# Patient Record
Sex: Female | Born: 1970 | Race: White | Hispanic: No | Marital: Single | Smoking: Never smoker
Health system: Southern US, Community
[De-identification: ages and names within clinical notes are randomized; demographics above are authoritative.]

## PROBLEM LIST (undated history)

## (undated) DIAGNOSIS — R9389 Abnormal findings on diagnostic imaging of other specified body structures: Secondary | ICD-10-CM

## (undated) DIAGNOSIS — M1990 Unspecified osteoarthritis, unspecified site: Secondary | ICD-10-CM

## (undated) DIAGNOSIS — E119 Type 2 diabetes mellitus without complications: Secondary | ICD-10-CM

## (undated) DIAGNOSIS — G809 Cerebral palsy, unspecified: Secondary | ICD-10-CM

## (undated) DIAGNOSIS — J449 Chronic obstructive pulmonary disease, unspecified: Secondary | ICD-10-CM

## (undated) DIAGNOSIS — F32A Depression, unspecified: Secondary | ICD-10-CM

## (undated) DIAGNOSIS — K219 Gastro-esophageal reflux disease without esophagitis: Secondary | ICD-10-CM

## (undated) DIAGNOSIS — F329 Major depressive disorder, single episode, unspecified: Secondary | ICD-10-CM

## (undated) DIAGNOSIS — T8859XA Other complications of anesthesia, initial encounter: Secondary | ICD-10-CM

## (undated) DIAGNOSIS — J45909 Unspecified asthma, uncomplicated: Secondary | ICD-10-CM

## (undated) DIAGNOSIS — F419 Anxiety disorder, unspecified: Secondary | ICD-10-CM

## (undated) DIAGNOSIS — N950 Postmenopausal bleeding: Secondary | ICD-10-CM

## (undated) DIAGNOSIS — I10 Essential (primary) hypertension: Secondary | ICD-10-CM

## (undated) DIAGNOSIS — T4145XA Adverse effect of unspecified anesthetic, initial encounter: Secondary | ICD-10-CM

## (undated) HISTORY — DX: Gastro-esophageal reflux disease without esophagitis: K21.9

## (undated) HISTORY — DX: Unspecified osteoarthritis, unspecified site: M19.90

## (undated) HISTORY — DX: Depression, unspecified: F32.A

## (undated) HISTORY — DX: Essential (primary) hypertension: I10

## (undated) HISTORY — PX: CHOLECYSTECTOMY: SHX55

## (undated) HISTORY — DX: Cerebral palsy, unspecified: G80.9

## (undated) HISTORY — DX: Chronic obstructive pulmonary disease, unspecified: J44.9

## (undated) HISTORY — DX: Major depressive disorder, single episode, unspecified: F32.9

## (undated) HISTORY — PX: OTHER SURGICAL HISTORY: SHX169

## (undated) HISTORY — DX: Unspecified asthma, uncomplicated: J45.909

## (undated) HISTORY — DX: Type 2 diabetes mellitus without complications: E11.9

## (undated) HISTORY — DX: Anxiety disorder, unspecified: F41.9

## (undated) HISTORY — PX: JOINT REPLACEMENT: SHX530

## (undated) HISTORY — PX: FOOT CAPSULE RELEASE W/ PERCUTANEOUS HEEL CORD LENGTHENING, TIBIAL TENDON TRANSFER: SHX1658

---

## 2004-05-05 ENCOUNTER — Ambulatory Visit: Payer: Self-pay | Admitting: Family Medicine

## 2004-07-11 ENCOUNTER — Emergency Department: Payer: Self-pay | Admitting: Emergency Medicine

## 2004-07-12 ENCOUNTER — Ambulatory Visit: Payer: Self-pay | Admitting: Emergency Medicine

## 2004-11-09 ENCOUNTER — Ambulatory Visit: Payer: Self-pay | Admitting: Family Medicine

## 2004-12-18 ENCOUNTER — Emergency Department: Payer: Self-pay | Admitting: Emergency Medicine

## 2005-01-03 ENCOUNTER — Ambulatory Visit: Payer: Self-pay | Admitting: Unknown Physician Specialty

## 2005-02-15 ENCOUNTER — Emergency Department: Payer: Self-pay | Admitting: Unknown Physician Specialty

## 2006-01-21 ENCOUNTER — Ambulatory Visit: Payer: Self-pay

## 2006-02-05 ENCOUNTER — Ambulatory Visit: Payer: Self-pay | Admitting: General Surgery

## 2006-02-08 ENCOUNTER — Ambulatory Visit: Payer: Self-pay | Admitting: General Surgery

## 2006-03-04 ENCOUNTER — Encounter: Payer: Self-pay | Admitting: Physical Medicine and Rehabilitation

## 2006-03-30 ENCOUNTER — Encounter: Payer: Self-pay | Admitting: Physical Medicine and Rehabilitation

## 2006-06-03 ENCOUNTER — Emergency Department: Payer: Self-pay

## 2006-11-11 ENCOUNTER — Ambulatory Visit: Payer: Self-pay | Admitting: Pain Medicine

## 2006-12-09 ENCOUNTER — Ambulatory Visit: Payer: Self-pay | Admitting: Pain Medicine

## 2006-12-09 ENCOUNTER — Emergency Department: Payer: Self-pay | Admitting: Emergency Medicine

## 2006-12-12 ENCOUNTER — Other Ambulatory Visit: Payer: Self-pay

## 2006-12-12 ENCOUNTER — Emergency Department: Payer: Self-pay | Admitting: Emergency Medicine

## 2006-12-31 ENCOUNTER — Ambulatory Visit: Payer: Self-pay | Admitting: Gastroenterology

## 2007-02-19 ENCOUNTER — Ambulatory Visit: Payer: Self-pay | Admitting: Gastroenterology

## 2007-04-04 ENCOUNTER — Encounter: Payer: Self-pay | Admitting: Physical Medicine and Rehabilitation

## 2007-12-15 ENCOUNTER — Encounter: Payer: Self-pay | Admitting: Orthopedic Surgery

## 2007-12-30 ENCOUNTER — Encounter: Payer: Self-pay | Admitting: Orthopedic Surgery

## 2008-01-29 ENCOUNTER — Encounter: Payer: Self-pay | Admitting: Orthopedic Surgery

## 2008-02-29 ENCOUNTER — Encounter: Payer: Self-pay | Admitting: Orthopedic Surgery

## 2008-03-30 ENCOUNTER — Encounter: Payer: Self-pay | Admitting: Orthopedic Surgery

## 2008-04-30 ENCOUNTER — Encounter: Payer: Self-pay | Admitting: Orthopedic Surgery

## 2008-05-31 ENCOUNTER — Encounter: Payer: Self-pay | Admitting: Orthopedic Surgery

## 2008-09-08 ENCOUNTER — Encounter: Payer: Self-pay | Admitting: Family Medicine

## 2008-09-28 ENCOUNTER — Encounter: Payer: Self-pay | Admitting: Family Medicine

## 2008-10-04 ENCOUNTER — Ambulatory Visit: Payer: Self-pay | Admitting: Family Medicine

## 2008-10-28 ENCOUNTER — Encounter: Payer: Self-pay | Admitting: Family Medicine

## 2008-11-28 ENCOUNTER — Encounter: Payer: Self-pay | Admitting: Family Medicine

## 2008-12-29 ENCOUNTER — Encounter: Payer: Self-pay | Admitting: Family Medicine

## 2009-01-28 ENCOUNTER — Encounter: Payer: Self-pay | Admitting: Family Medicine

## 2009-03-16 ENCOUNTER — Encounter: Payer: Self-pay | Admitting: Orthopedic Surgery

## 2009-03-30 ENCOUNTER — Encounter: Payer: Self-pay | Admitting: Orthopedic Surgery

## 2009-04-30 ENCOUNTER — Encounter: Payer: Self-pay | Admitting: Orthopedic Surgery

## 2009-05-31 ENCOUNTER — Encounter: Payer: Self-pay | Admitting: Orthopedic Surgery

## 2009-06-28 ENCOUNTER — Encounter: Payer: Self-pay | Admitting: Orthopedic Surgery

## 2009-07-29 ENCOUNTER — Encounter: Payer: Self-pay | Admitting: Orthopedic Surgery

## 2009-08-28 ENCOUNTER — Encounter: Payer: Self-pay | Admitting: Orthopedic Surgery

## 2009-09-05 ENCOUNTER — Encounter: Payer: Self-pay | Admitting: Family Medicine

## 2009-09-28 ENCOUNTER — Encounter: Payer: Self-pay | Admitting: Family Medicine

## 2010-02-15 ENCOUNTER — Ambulatory Visit: Payer: Self-pay | Admitting: Pain Medicine

## 2010-03-07 ENCOUNTER — Ambulatory Visit: Payer: Self-pay | Admitting: Pain Medicine

## 2010-06-14 ENCOUNTER — Encounter: Payer: Self-pay | Admitting: Orthopedic Surgery

## 2010-06-29 ENCOUNTER — Encounter: Payer: Self-pay | Admitting: Orthopedic Surgery

## 2010-07-30 ENCOUNTER — Encounter: Payer: Self-pay | Admitting: Orthopedic Surgery

## 2010-08-10 ENCOUNTER — Ambulatory Visit: Payer: Self-pay | Admitting: Family Medicine

## 2010-08-29 ENCOUNTER — Encounter: Payer: Self-pay | Admitting: Orthopedic Surgery

## 2010-09-29 ENCOUNTER — Encounter: Payer: Self-pay | Admitting: Orthopedic Surgery

## 2010-10-29 ENCOUNTER — Encounter: Payer: Self-pay | Admitting: Orthopedic Surgery

## 2011-02-02 ENCOUNTER — Encounter: Payer: Self-pay | Admitting: Family Medicine

## 2011-03-01 ENCOUNTER — Encounter: Payer: Self-pay | Admitting: Family Medicine

## 2011-03-31 ENCOUNTER — Encounter: Payer: Self-pay | Admitting: Family Medicine

## 2011-10-17 ENCOUNTER — Encounter: Payer: Self-pay | Admitting: Orthopedic Surgery

## 2011-10-29 ENCOUNTER — Encounter: Payer: Self-pay | Admitting: Orthopedic Surgery

## 2012-02-06 DIAGNOSIS — R262 Difficulty in walking, not elsewhere classified: Secondary | ICD-10-CM | POA: Insufficient documentation

## 2012-05-07 DIAGNOSIS — M1710 Unilateral primary osteoarthritis, unspecified knee: Secondary | ICD-10-CM | POA: Insufficient documentation

## 2012-05-26 ENCOUNTER — Encounter: Payer: Self-pay | Admitting: Physical Medicine and Rehabilitation

## 2012-05-31 ENCOUNTER — Encounter: Payer: Self-pay | Admitting: Physical Medicine and Rehabilitation

## 2012-06-28 ENCOUNTER — Encounter: Payer: Self-pay | Admitting: Physical Medicine and Rehabilitation

## 2012-07-29 ENCOUNTER — Encounter: Payer: Self-pay | Admitting: Physical Medicine and Rehabilitation

## 2012-08-12 DIAGNOSIS — R1013 Epigastric pain: Secondary | ICD-10-CM | POA: Insufficient documentation

## 2012-08-12 DIAGNOSIS — K219 Gastro-esophageal reflux disease without esophagitis: Secondary | ICD-10-CM | POA: Insufficient documentation

## 2012-08-12 DIAGNOSIS — R197 Diarrhea, unspecified: Secondary | ICD-10-CM | POA: Insufficient documentation

## 2012-08-28 ENCOUNTER — Encounter: Payer: Self-pay | Admitting: Physical Medicine and Rehabilitation

## 2012-12-04 DIAGNOSIS — Z79899 Other long term (current) drug therapy: Secondary | ICD-10-CM | POA: Insufficient documentation

## 2013-01-10 DIAGNOSIS — K529 Noninfective gastroenteritis and colitis, unspecified: Secondary | ICD-10-CM | POA: Insufficient documentation

## 2013-01-10 DIAGNOSIS — K589 Irritable bowel syndrome without diarrhea: Secondary | ICD-10-CM | POA: Insufficient documentation

## 2013-01-10 DIAGNOSIS — I10 Essential (primary) hypertension: Secondary | ICD-10-CM | POA: Insufficient documentation

## 2013-01-10 HISTORY — DX: Noninfective gastroenteritis and colitis, unspecified: K52.9

## 2013-01-13 ENCOUNTER — Emergency Department: Payer: Self-pay | Admitting: Internal Medicine

## 2013-04-28 DIAGNOSIS — G8929 Other chronic pain: Secondary | ICD-10-CM | POA: Insufficient documentation

## 2013-07-08 ENCOUNTER — Emergency Department: Payer: Self-pay | Admitting: Emergency Medicine

## 2013-07-08 LAB — BASIC METABOLIC PANEL
Anion Gap: 6 — ABNORMAL LOW (ref 7–16)
BUN: 10 mg/dL (ref 7–18)
CALCIUM: 8.5 mg/dL (ref 8.5–10.1)
CO2: 27 mmol/L (ref 21–32)
Chloride: 104 mmol/L (ref 98–107)
Creatinine: 0.9 mg/dL (ref 0.60–1.30)
EGFR (Non-African Amer.): >60
Glucose: 91 mg/dL (ref 65–99)
Osmolality: 272 (ref 275–301)
POTASSIUM: 3.5 mmol/L (ref 3.5–5.1)
Sodium: 137 mmol/L (ref 136–145)

## 2013-07-08 LAB — CBC
HCT: 33.6 % — AB (ref 35.0–47.0)
HGB: 10.7 g/dL — ABNORMAL LOW (ref 12.0–16.0)
MCH: 22.5 pg — ABNORMAL LOW (ref 26.0–34.0)
MCHC: 31.9 g/dL — ABNORMAL LOW (ref 32.0–36.0)
MCV: 71 fL — AB (ref 80–100)
Platelet: 302 10*3/uL (ref 150–440)
RBC: 4.76 10*6/uL (ref 3.80–5.20)
RDW: 18 % — ABNORMAL HIGH (ref 11.5–14.5)
WBC: 10.3 10*3/uL (ref 3.6–11.0)

## 2013-07-08 LAB — PRO B NATRIURETIC PEPTIDE: B-TYPE NATIURETIC PEPTID: 72 pg/mL (ref 0–125)

## 2013-07-08 LAB — TROPONIN I

## 2013-07-09 LAB — TROPONIN I: Troponin-I: <0.02 ng/mL

## 2013-10-27 ENCOUNTER — Encounter: Payer: Self-pay | Admitting: Family Medicine

## 2013-10-28 ENCOUNTER — Encounter: Payer: Self-pay | Admitting: Family Medicine

## 2013-11-28 ENCOUNTER — Encounter: Payer: Self-pay | Admitting: Family Medicine

## 2013-12-29 ENCOUNTER — Encounter: Payer: Self-pay | Admitting: Family Medicine

## 2014-01-04 ENCOUNTER — Emergency Department: Payer: Self-pay | Admitting: Emergency Medicine

## 2014-01-04 ENCOUNTER — Inpatient Hospital Stay: Payer: Self-pay | Admitting: Internal Medicine

## 2014-01-04 LAB — COMPREHENSIVE METABOLIC PANEL
ALBUMIN: 3.4 g/dL (ref 3.4–5.0)
ALK PHOS: 126 U/L — AB
ALT: 29 U/L
ANION GAP: 13 (ref 7–16)
BUN: 21 mg/dL — AB (ref 7–18)
Bilirubin,Total: 0.7 mg/dL (ref 0.2–1.0)
CO2: 22 mmol/L (ref 21–32)
CREATININE: 2.21 mg/dL — AB (ref 0.60–1.30)
Calcium, Total: 9.1 mg/dL (ref 8.5–10.1)
Chloride: 102 mmol/L (ref 98–107)
EGFR (African American): 31 — ABNORMAL LOW
GFR CALC NON AF AMER: 26 — AB
GLUCOSE: 107 mg/dL — AB (ref 65–99)
Osmolality: 277 (ref 275–301)
Potassium: 4.6 mmol/L (ref 3.5–5.1)
SGOT(AST): 42 U/L — ABNORMAL HIGH (ref 15–37)
Sodium: 137 mmol/L (ref 136–145)
TOTAL PROTEIN: 7.9 g/dL (ref 6.4–8.2)

## 2014-01-04 LAB — CBC
HCT: 36.8 % (ref 35.0–47.0)
HGB: 11 g/dL — AB (ref 12.0–16.0)
MCH: 21.6 pg — ABNORMAL LOW (ref 26.0–34.0)
MCHC: 29.7 g/dL — AB (ref 32.0–36.0)
MCV: 73 fL — ABNORMAL LOW (ref 80–100)
Platelet: 415 10*3/uL (ref 150–440)
RBC: 5.07 10*6/uL (ref 3.80–5.20)
RDW: 18.2 % — AB (ref 11.5–14.5)
WBC: 14.4 10*3/uL — ABNORMAL HIGH (ref 3.6–11.0)

## 2014-01-04 LAB — TROPONIN I

## 2014-01-04 LAB — CK TOTAL AND CKMB (NOT AT ARMC)
CK, Total: 276 U/L — ABNORMAL HIGH
CK-MB: 5.7 ng/mL — AB (ref 0.5–3.6)

## 2014-01-04 LAB — HCG, QUANTITATIVE, PREGNANCY: Beta Hcg, Quant.: <1 m[IU]/mL — ABNORMAL LOW

## 2014-01-05 DIAGNOSIS — G801 Spastic diplegic cerebral palsy: Secondary | ICD-10-CM | POA: Insufficient documentation

## 2014-01-05 DIAGNOSIS — G809 Cerebral palsy, unspecified: Secondary | ICD-10-CM | POA: Insufficient documentation

## 2014-01-05 LAB — URINALYSIS, COMPLETE
BLOOD: NEGATIVE
Bacteria: NONE SEEN
Glucose,UR: NEGATIVE mg/dL (ref 0–75)
Hyaline Cast: 13
Ketone: NEGATIVE
Nitrite: NEGATIVE
Ph: 5 (ref 4.5–8.0)
Protein: NEGATIVE
Specific Gravity: 1.016 (ref 1.003–1.030)
Squamous Epithelial: 3
WBC UR: 20 /HPF (ref 0–5)

## 2014-01-05 LAB — CBC WITH DIFFERENTIAL/PLATELET
BASOS ABS: 0.1 10*3/uL (ref 0.0–0.1)
BASOS PCT: 0.7 %
EOS ABS: 0.4 10*3/uL (ref 0.0–0.7)
EOS PCT: 3.8 %
HCT: 36.2 % (ref 35.0–47.0)
HGB: 11.1 g/dL — AB (ref 12.0–16.0)
LYMPHS ABS: 3.1 10*3/uL (ref 1.0–3.6)
Lymphocyte %: 31 %
MCH: 22.1 pg — AB (ref 26.0–34.0)
MCHC: 30.8 g/dL — AB (ref 32.0–36.0)
MCV: 72 fL — AB (ref 80–100)
MONO ABS: 0.4 x10 3/mm (ref 0.2–0.9)
Monocyte %: 4.4 %
Neutrophil #: 6 10*3/uL (ref 1.4–6.5)
Neutrophil %: 60.1 %
PLATELETS: 384 10*3/uL (ref 150–440)
RBC: 5.05 10*6/uL (ref 3.80–5.20)
RDW: 18.5 % — ABNORMAL HIGH (ref 11.5–14.5)
WBC: 10 10*3/uL (ref 3.6–11.0)

## 2014-01-05 LAB — DRUG SCREEN, URINE
AMPHETAMINES, UR SCREEN: NEGATIVE (ref ?–1000)
Barbiturates, Ur Screen: NEGATIVE (ref ?–200)
Benzodiazepine, Ur Scrn: NEGATIVE (ref ?–200)
CANNABINOID 50 NG, UR ~~LOC~~: NEGATIVE (ref ?–50)
Cocaine Metabolite,Ur ~~LOC~~: NEGATIVE (ref ?–300)
MDMA (ECSTASY) UR SCREEN: NEGATIVE (ref ?–500)
METHADONE, UR SCREEN: NEGATIVE (ref ?–300)
Opiate, Ur Screen: POSITIVE (ref ?–300)
Phencyclidine (PCP) Ur S: NEGATIVE (ref ?–25)
Tricyclic, Ur Screen: NEGATIVE (ref ?–1000)

## 2014-01-05 LAB — BASIC METABOLIC PANEL
Anion Gap: 8 (ref 7–16)
BUN: 22 mg/dL — ABNORMAL HIGH (ref 7–18)
CO2: 21 mmol/L (ref 21–32)
Calcium, Total: 8.6 mg/dL (ref 8.5–10.1)
Chloride: 107 mmol/L (ref 98–107)
Creatinine: 1.64 mg/dL — ABNORMAL HIGH (ref 0.60–1.30)
EGFR (African American): 44 — ABNORMAL LOW
EGFR (Non-African Amer.): 38 — ABNORMAL LOW
GLUCOSE: 116 mg/dL — AB (ref 65–99)
Osmolality: 276 (ref 275–301)
Potassium: 4 mmol/L (ref 3.5–5.1)
Sodium: 136 mmol/L (ref 136–145)

## 2014-01-05 LAB — PREGNANCY, URINE: Pregnancy Test, Urine: NEGATIVE m[IU]/mL

## 2014-01-05 LAB — CK: CK, Total: 2590 U/L — ABNORMAL HIGH

## 2014-01-06 LAB — BASIC METABOLIC PANEL
ANION GAP: 8 (ref 7–16)
BUN: 12 mg/dL (ref 7–18)
Calcium, Total: 8 mg/dL — ABNORMAL LOW (ref 8.5–10.1)
Chloride: 113 mmol/L — ABNORMAL HIGH (ref 98–107)
Co2: 23 mmol/L (ref 21–32)
Creatinine: 1 mg/dL (ref 0.60–1.30)
Glucose: 127 mg/dL — ABNORMAL HIGH (ref 65–99)
Osmolality: 288 (ref 275–301)
Potassium: 4 mmol/L (ref 3.5–5.1)
Sodium: 144 mmol/L (ref 136–145)

## 2014-01-06 LAB — URINALYSIS, COMPLETE
BLOOD: NEGATIVE
Bacteria: NONE SEEN
Glucose,UR: NEGATIVE mg/dL (ref 0–75)
KETONE: NEGATIVE
Nitrite: NEGATIVE
PH: 5 (ref 4.5–8.0)
Protein: NEGATIVE
SPECIFIC GRAVITY: 1.021 (ref 1.003–1.030)
Squamous Epithelial: 3
WBC UR: 14 /HPF (ref 0–5)

## 2014-01-06 LAB — CK
CK, TOTAL: 4137 U/L — AB
CK, Total: 4946 U/L — ABNORMAL HIGH

## 2014-01-14 ENCOUNTER — Emergency Department: Payer: Self-pay | Admitting: Emergency Medicine

## 2014-01-14 LAB — BASIC METABOLIC PANEL
ANION GAP: 9 (ref 7–16)
BUN: 8 mg/dL (ref 7–18)
CHLORIDE: 109 mmol/L — AB (ref 98–107)
CO2: 23 mmol/L (ref 21–32)
CREATININE: 1.16 mg/dL (ref 0.60–1.30)
Calcium, Total: 8.5 mg/dL (ref 8.5–10.1)
EGFR (Non-African Amer.): 58 — ABNORMAL LOW
Glucose: 117 mg/dL — ABNORMAL HIGH (ref 65–99)
Osmolality: 281 (ref 275–301)
POTASSIUM: 3.6 mmol/L (ref 3.5–5.1)
SODIUM: 141 mmol/L (ref 136–145)

## 2014-01-14 LAB — TROPONIN I: Troponin-I: <0.02 ng/mL

## 2014-01-14 LAB — CBC
HCT: 30.4 % — AB (ref 35.0–47.0)
HGB: 9.5 g/dL — ABNORMAL LOW (ref 12.0–16.0)
MCH: 22 pg — AB (ref 26.0–34.0)
MCHC: 31.2 g/dL — ABNORMAL LOW (ref 32.0–36.0)
MCV: 70 fL — ABNORMAL LOW (ref 80–100)
PLATELETS: 345 10*3/uL (ref 150–440)
RBC: 4.32 10*6/uL (ref 3.80–5.20)
RDW: 18.4 % — AB (ref 11.5–14.5)
WBC: 10 10*3/uL (ref 3.6–11.0)

## 2014-01-28 ENCOUNTER — Encounter: Payer: Self-pay | Admitting: Family Medicine

## 2014-02-28 ENCOUNTER — Encounter: Payer: Self-pay | Admitting: Family Medicine

## 2014-04-06 ENCOUNTER — Encounter: Payer: Self-pay | Admitting: Family Medicine

## 2014-04-15 ENCOUNTER — Emergency Department: Payer: Self-pay | Admitting: Student

## 2014-04-15 LAB — COMPREHENSIVE METABOLIC PANEL
ALBUMIN: 3.4 g/dL (ref 3.4–5.0)
ALT: 23 U/L
ANION GAP: 11 (ref 7–16)
Alkaline Phosphatase: 126 U/L — ABNORMAL HIGH
BUN: 9 mg/dL (ref 7–18)
Bilirubin,Total: 0.3 mg/dL (ref 0.2–1.0)
CALCIUM: 9.1 mg/dL (ref 8.5–10.1)
CHLORIDE: 103 mmol/L (ref 98–107)
CO2: 24 mmol/L (ref 21–32)
Creatinine: 1.01 mg/dL (ref 0.60–1.30)
EGFR (African American): >60
GLUCOSE: 161 mg/dL — AB (ref 65–99)
OSMOLALITY: 278 (ref 275–301)
POTASSIUM: 3.8 mmol/L (ref 3.5–5.1)
SGOT(AST): 15 U/L (ref 15–37)
Sodium: 138 mmol/L (ref 136–145)
Total Protein: 7.9 g/dL (ref 6.4–8.2)

## 2014-04-15 LAB — URINALYSIS, COMPLETE
BILIRUBIN, UR: NEGATIVE
Bacteria: NONE SEEN
GLUCOSE, UR: NEGATIVE mg/dL (ref 0–75)
Ketone: NEGATIVE
NITRITE: NEGATIVE
PH: 7 (ref 4.5–8.0)
RBC,UR: 9150 /HPF (ref 0–5)
Specific Gravity: 1.008 (ref 1.003–1.030)
WBC UR: 76 /HPF (ref 0–5)

## 2014-04-15 LAB — CBC
HCT: 38.5 % (ref 35.0–47.0)
HGB: 11.9 g/dL — ABNORMAL LOW (ref 12.0–16.0)
MCH: 21.8 pg — AB (ref 26.0–34.0)
MCHC: 30.8 g/dL — ABNORMAL LOW (ref 32.0–36.0)
MCV: 71 fL — ABNORMAL LOW (ref 80–100)
Platelet: 400 10*3/uL (ref 150–440)
RBC: 5.43 10*6/uL — ABNORMAL HIGH (ref 3.80–5.20)
RDW: 22.2 % — AB (ref 11.5–14.5)
WBC: 16.5 10*3/uL — ABNORMAL HIGH (ref 3.6–11.0)

## 2014-04-15 LAB — LIPASE, BLOOD: LIPASE: 50 U/L — AB (ref 73–393)

## 2014-04-15 LAB — PREGNANCY, URINE: Pregnancy Test, Urine: NEGATIVE m[IU]/mL

## 2014-04-29 ENCOUNTER — Emergency Department: Payer: Self-pay | Admitting: Emergency Medicine

## 2014-04-29 LAB — CBC WITH DIFFERENTIAL/PLATELET
BASOS ABS: 0.2 10*3/uL — AB (ref 0.0–0.1)
Basophil %: 1.3 %
Eosinophil #: 0.4 10*3/uL (ref 0.0–0.7)
Eosinophil %: 2.3 %
HCT: 35.9 % (ref 35.0–47.0)
HGB: 11 g/dL — ABNORMAL LOW (ref 12.0–16.0)
LYMPHS PCT: 17.8 %
Lymphocyte #: 3.1 10*3/uL (ref 1.0–3.6)
MCH: 22 pg — ABNORMAL LOW (ref 26.0–34.0)
MCHC: 30.6 g/dL — AB (ref 32.0–36.0)
MCV: 72 fL — ABNORMAL LOW (ref 80–100)
Monocyte #: 0.8 x10 3/mm (ref 0.2–0.9)
Monocyte %: 4.7 %
Neutrophil #: 12.8 10*3/uL — ABNORMAL HIGH (ref 1.4–6.5)
Neutrophil %: 73.9 %
Platelet: 327 10*3/uL (ref 150–440)
RBC: 5 10*6/uL (ref 3.80–5.20)
RDW: 21.7 % — AB (ref 11.5–14.5)
WBC: 17.3 10*3/uL — AB (ref 3.6–11.0)

## 2014-04-29 LAB — BASIC METABOLIC PANEL
Anion Gap: 5 — ABNORMAL LOW (ref 7–16)
BUN: 8 mg/dL (ref 7–18)
CO2: 26 mmol/L (ref 21–32)
Calcium, Total: 8.5 mg/dL (ref 8.5–10.1)
Chloride: 103 mmol/L (ref 98–107)
Creatinine: 1.03 mg/dL (ref 0.60–1.30)
EGFR (African American): >60
EGFR (Non-African Amer.): >60
GLUCOSE: 156 mg/dL — AB (ref 65–99)
Osmolality: 270 (ref 275–301)
Potassium: 3.8 mmol/L (ref 3.5–5.1)
Sodium: 134 mmol/L — ABNORMAL LOW (ref 136–145)

## 2014-04-30 ENCOUNTER — Encounter: Payer: Self-pay | Admitting: Family Medicine

## 2014-05-31 ENCOUNTER — Encounter: Payer: Self-pay | Admitting: Family Medicine

## 2014-06-15 ENCOUNTER — Ambulatory Visit: Payer: Self-pay

## 2014-06-29 ENCOUNTER — Encounter
Admit: 2014-06-29 | Disposition: A | Discharge status: Home / Self Care | Payer: Self-pay | Attending: Family Medicine | Admitting: Family Medicine

## 2014-07-30 ENCOUNTER — Encounter
Admit: 2014-07-30 | Disposition: A | Discharge status: Home / Self Care | Payer: Self-pay | Attending: Family Medicine | Admitting: Family Medicine

## 2014-08-21 NOTE — H&P (Signed)
PATIENT NAME:  Traci, Davis MR#:  [id number] DATE OF BIRTH:  Feb 28, 1971  ADMITTING PHYSICIAN: Gladstone Lighter, MD   PRIMARY CARE PHYSICIAN: Meindert A. Brunetta Genera, MD   CHIEF COMPLAINT: Back pain radiating down both legs.   HISTORY OF PRESENT ILLNESS: [name] is a 44 year old Caucasian female with a past medical history significant for cerebral palsy, hypertension, chronic pain syndrome, gastroesophageal reflux disease and anxiety, depression, presents to the hospital after she had a fall today. The patient was at home. She was walking with her walker from a carpeted surface to a hardwood floor with her socks on. She slipped and fell onto her right side along with her walker. She presented to the Emergency Room initially this morning when she had x-rays done without blood work at the time of her right knee and also right leg and right shoulder as she complained of right-sided pain and the x-rays did not show any acute fracture so she was discharged home. However, the patient comes back again with severe cramping of her back muscles with severe pain, 10/10, radiating down both her legs, worse on the right side again in the evening. Her creatinine, which is normal at baseline, is found to be elevated at 2.2 with elevation in her CK, CPK, elevated white count and also AST. She has been complaining of severe spasmic pain, mostly in the paravertebral region in the lumbar spine area radiating down her legs. So she is being admitted for pain control and also acute renal failure and possible rhabdomyolysis.   PAST MEDICAL HISTORY:  1.  Hypertension.  2.  Chronic pain syndrome.  3.  Cerebral palsy.  4.  Anxiety and depression.  5.  Chronic calf cramps.  6.  Gastroesophageal reflux disease.  7.  COPD.  PAST SURGICAL HISTORY:  1.  Cholecystectomy.  2.  Bilateral knee replacement surgeries multiple times.   ALLERGIES TO MEDICATIONS: BACLOFEN, CIPRO, KEFLEX, LEVAQUIN, MORPHINE, QUINOLONES AND  VANTIN.  CURRENT HOME MEDICATIONS:  1.  Cymbalta 120 mg p.o. daily.  2.  Etodolac 400 mg p.o. b.i.d.  3.  Klonopin 0.5 mg p.o. b.i.d.  4.  Amitiza 24 mcg p.o. t.i.d.  5.  Prilosec 40 mg p.o. b.i.d.  6.  VESIcare 5 mg p.o. daily.  7.  Albuterol inhaler p.r.n. for wheezing.  8.  Torsemide 20 mg p.o. daily.  9.  Lisinopril 20 mg p.o. daily.  10.  Phenergan 25 mg q.i.d. p.r.n.  11.  Abilify 5 mg daily.  12.  Percocet 10/325 mg 1 tablet p.o. 3 times a day as needed for pain.   SOCIAL HISTORY: Lives at home with her friend. No smoking or alcohol use.  FAMILY HISTORY: "My dad had heart problems."   REVIEW OF SYSTEMS:  CONSTITUTIONAL: No fever, fatigue or weakness.  EYES: No blurred vision, double vision, inflammation or glaucoma.  ENT: No tinnitus, ear pain, hearing loss, epistaxis or discharge.  RESPIRATORY: No cough, wheeze, hemoptysis. Positive for history of COPD. CARDIOVASCULAR: No chest pain, orthopnea, edema, arrhythmia, palpitations or syncope.  GASTROINTESTINAL: No nausea, vomiting, diarrhea, abdominal pain, hematemesis or melena.  GENITOURINARY: No dysuria, hematuria, renal calculus, frequency or incontinence.  ENDOCRINE: No polyuria, nocturia or thyroid problems, heat or cold intolerance.  HEMATOLOGY: No anemia, easy bruising or bleeding.  SKIN: No acne, rash or lesions.  MUSCULOSKELETAL: Severe low back pain, arthritis.  NEUROLOGICAL: She has sciatica pain radiating from the back down her legs. She has cerebral palsy and minimal left-sided weakness. No CVA, TIA or  seizures.  PSYCHOLOGIC: Positive for anxiety, depression and insomnia.   PHYSICAL EXAMINATION:  VITAL SIGNS: Temperature 97.5 degrees Fahrenheit, pulse 98, respirations 20, blood pressure 78/48, pulse ox 100% on room air.  GENERAL: Heavily-built, well-nourished female lying in bed, not in any acute distress.  HEENT: Normocephalic, atraumatic. Pupils equal, round, reacting to light. Anicteric sclerae. Extraocular  movements intact. Oropharynx is clear without erythema, mass or exudates.  NECK: Supple. No thyromegaly, JVD or carotid bruit. No lymphadenopathy.  LUNGS: Moving air bilaterally. No wheeze or crackles. No use of accessory muscles for breathing.  CARDIOVASCULAR: S1, S2, regular rate and rhythm. No murmurs, rubs or gallops.  ABDOMEN: Soft, nontender, nondistended. No hepatosplenomegaly. Normal bowel sounds.  EXTREMITIES: No pedal edema. No clubbing or cyanosis, 2+ dorsalis pedis pulses palpable bilaterally.  MUSCULOSKELETAL: Complaining of low back pain, but able to move herself in bed. Pain and tenderness mostly in the paravertebral region radiating down the gluteal muscles to the feet, but she has her sensation intact. Her bowel, bladder are continent and her motor and sensory function is intact.  NEUROLOGIC: Cranial nerves normal, II through XII. No focal motor or sensory deficits. She has minimal weakness on the left side, which is chronic, 4/5 strength on the left side.  PSYCHOLOGIC: She is very anxious, emotionally labile, but alert, oriented x 3.   LABORATORY DATA: WBC 14.4, hemoglobin 11.0, hematocrit 36.8, platelet count 450,000.  Sodium 137, potassium 4.6, chloride 102, bicarbonate 22, BUN 21, creatinine 2.2, glucose 107 and calcium of 9.1.  ALT 29, AST 42, alkaline phosphatase 126, total bilirubin 0.7 and albumin of 3.4.   Right knee x-ray showing postoperative changes in patella, which appears high-riding and distal femur without joint effusions or fracture. Degenerative changes noted. Shoulder x-ray on the right side showed no acute bony abnormalities. Mild AC joint spurring. CK 276, CK-MB 5.7. Troponin is negative. Chest x-ray showing clear lung fields. No acute cardiopulmonary disease.    ASSESSMENT AND PLAN: A 44 year old female with known history of chronic pain syndrome, cerebral palsy, anxiety issues, presents after she had a fall and noted to be hypotensive, acute renal failure on  laboratories.  1.  Possible rhabdomyolysis with acute renal failure, likely from the fall and also she is on torsemide and lisinopril at home. We will hold both medications. IV fluids and recheck in a.m. Follow up CPK level in a.m. as well. CT of the lumbar spine has been ordered secondary to her intractable pain after the fall, but she is able to move fine so less likely to have any fractures.  2.  Elevated CPK level. Follow up in a.m.  3.  Leukocytosis, likely stress reaction from the fall. No source of infection identified. Chest x-ray clear. Follow up urinalysis at this time.  4.  Chronic pain syndrome with acute on chronic pain from the fall. X-rays revealed there is no fracture, so continue her home Percocet. Dilaudid p.r.n. at this time and monitor.  5.  Hypotension, likely hypovolemic hypotension. Hold torsemide. IV fluids, responding to fluids. Check urinalysis to rule out any infection.  6.  Depression and anxiety. Continue her home medications. Also Ativan p.r.n.  7.  Hypertension. As she is hypotensive, hold her lisinopril at this time.  8.  Gastroesophageal reflux disease. Continue proton pump inhibitor b.i.d.   CODE STATUS OF THE PATIENT: Full code.   TIME SPENT ON ADMISSION: 50 minutes.    ____________________________ Gladstone Lighter, MD rk:ts D: 01/04/2014 19:05:00 ET T: 01/04/2014 20:28:03 ET JOB#: [id number]  cc: Gladstone Lighter, MD, <Dictator> Meindert A. Brunetta Genera, MD  Gladstone Lighter MD ELECTRONICALLY SIGNED 01/08/2014 14:33

## 2014-08-21 NOTE — Discharge Summary (Signed)
PATIENT NAME:  Traci Davis, Traci Davis MR#:  [id number] DATE OF BIRTH:  1970/08/23  DATE OF ADMISSION:  01/04/2014  DATE OF DISCHARGE:  01/06/2014  ADMISSION DIAGNOSIS: Acute renal failure with rhabdomyolysis.   DISCHARGE DIAGNOSES: 1. Acute renal failure secondary to acute rhabdomyolysis from fall and acute tubular necrosis.  2. Rhabdomyolysis.  3. Hypotension.  4. Acute and chronic back pain.  5. Hypertension.  6. Depression and anxiety.  7. History of cerebral palsy.   IMAGING: The patient had a CT scan which showed spondylolysis of L2-L3 and L3-L4. No acute fracture.   LABORATORY DATA: CK at discharge is 4100. Sodium 144, potassium 4.0, chloride 113, bicarbonate 23, BUN 12, creatinine 1. Glucose is 127.   HOSPITAL COURSE: A 44 year old female with cerebral palsy and anxiety who is status post a fall, found to have acute renal failure and rhabdomyolysis. For further details, please refer to the H and P.  1. Acute renal failure. This is a combination of rhabdomyolysis and ATN. She was started on aggressive IV fluids. Her CPK was monitored. Her CPK is trending down. Her creatinine has much improved. We held her ACE inhibitor and torsemide. She may resume her ACE inhibitor as her creatinine has improved.  2. Rhabdomyolysis. Has slowly improved. The patient really wants to go home. She is not complaining of any muscle pains. Her CPK is trending down and creatinine is normal. No evidence of electrolyte abnormality. She will have follow-up tomorrow with her PCP for a CK and BMP check.  3. Hypotension on admission, which improved with IV fluids.  4. Acute and chronic back pain after fall, most likely paraspinal tightness and pain. CT of the lumbar spine showed no acute changes.  5. Hypertension. Initially her lisinopril and torsemide were held. She will continue her lisinopril at discharge.  6. Depression and anxiety. The patient will continue her outpatient medications.  7. History of cerebral palsy,  which was stable.  8. Urinary tract infection. The patient had a urinary tract infection. She was started on Macrobid.   DISCHARGE MEDICATIONS: 1. Oxycodone 5 mg every 6 hours x 5 days, #10.  2. Clonazepam 0.5 mg b.i.d.  3. MiraLax daily.  4. Omeprazole 40 mg daily.  5. Cymbalta 120 mg daily.  6. Abilify 5 mg daily.  7. Amitiza 24 mcg 3 times a day.  8. Lisinopril 20 mg daily.  9    Etodolac 400 mg PO BID 10. VESIcare 5 mg daily.  11. ProAir daily.  12. Macrodantin 100 mg p.o. b.i.d.   DISCHARGE DIET: Regular diet.   DISCHARGE ACTIVITY: As tolerated. Drink at least 68 ounces of fluids today and tomorrow.   DISCHARGE FOLLOW UP: The patient will follow up with [facility] tomorrow with Dr. Lavena Bullion.  The patient was stable for discharge.   TIME SPENT: 35 minutes on discharge     ____________________________ Marysue Fait P. Benjie Karvonen, MD spm:TT D: 01/06/2014 13:11:00 ET T: 01/06/2014 15:18:35 ET JOB#: [id number]  cc: Jini Horiuchi P. Benjie Karvonen, MD, <Dictator> Threasa Alpha, FNP-BC Marijayne Rauth P Markeya Mincy MD ELECTRONICALLY SIGNED 01/06/2014 20:58

## 2014-08-23 LAB — SURGICAL PATHOLOGY

## 2014-08-30 ENCOUNTER — Encounter: Payer: Self-pay | Admitting: Physical Therapy

## 2014-09-01 ENCOUNTER — Encounter: Payer: Self-pay | Admitting: Physical Therapy

## 2014-09-04 DIAGNOSIS — K859 Acute pancreatitis without necrosis or infection, unspecified: Secondary | ICD-10-CM

## 2014-09-04 HISTORY — DX: Acute pancreatitis without necrosis or infection, unspecified: K85.90

## 2014-09-05 DIAGNOSIS — E119 Type 2 diabetes mellitus without complications: Secondary | ICD-10-CM | POA: Insufficient documentation

## 2014-09-05 DIAGNOSIS — E1169 Type 2 diabetes mellitus with other specified complication: Secondary | ICD-10-CM | POA: Insufficient documentation

## 2014-09-08 ENCOUNTER — Encounter: Payer: Medicare Other | Admitting: Physical Therapy

## 2014-09-13 ENCOUNTER — Encounter: Payer: Medicare Other | Admitting: Physical Therapy

## 2014-09-15 ENCOUNTER — Encounter: Payer: Medicare Other | Admitting: Physical Therapy

## 2014-11-23 ENCOUNTER — Ambulatory Visit: Payer: Medicare Other | Admitting: Occupational Therapy

## 2014-11-25 ENCOUNTER — Ambulatory Visit: Payer: Medicare Other | Admitting: Occupational Therapy

## 2014-11-29 ENCOUNTER — Ambulatory Visit: Payer: Medicare Other | Attending: Physical Medicine and Rehabilitation | Admitting: Occupational Therapy

## 2014-11-29 DIAGNOSIS — M25632 Stiffness of left wrist, not elsewhere classified: Secondary | ICD-10-CM | POA: Insufficient documentation

## 2014-11-29 DIAGNOSIS — G5612 Other lesions of median nerve, left upper limb: Secondary | ICD-10-CM | POA: Insufficient documentation

## 2014-11-29 DIAGNOSIS — M6281 Muscle weakness (generalized): Secondary | ICD-10-CM | POA: Insufficient documentation

## 2014-11-29 DIAGNOSIS — R262 Difficulty in walking, not elsewhere classified: Secondary | ICD-10-CM | POA: Insufficient documentation

## 2014-11-29 DIAGNOSIS — M25561 Pain in right knee: Secondary | ICD-10-CM | POA: Insufficient documentation

## 2014-11-29 DIAGNOSIS — M25562 Pain in left knee: Secondary | ICD-10-CM | POA: Insufficient documentation

## 2014-11-30 ENCOUNTER — Ambulatory Visit: Payer: Medicare Other | Admitting: Physical Therapy

## 2014-12-01 ENCOUNTER — Ambulatory Visit: Payer: Medicare Other | Admitting: Physical Therapy

## 2014-12-01 ENCOUNTER — Encounter: Payer: Self-pay | Admitting: Physical Therapy

## 2014-12-01 DIAGNOSIS — M6281 Muscle weakness (generalized): Secondary | ICD-10-CM | POA: Diagnosis present

## 2014-12-01 DIAGNOSIS — M25562 Pain in left knee: Principal | ICD-10-CM

## 2014-12-01 DIAGNOSIS — M25561 Pain in right knee: Secondary | ICD-10-CM | POA: Diagnosis present

## 2014-12-01 DIAGNOSIS — R262 Difficulty in walking, not elsewhere classified: Secondary | ICD-10-CM

## 2014-12-01 DIAGNOSIS — G5612 Other lesions of median nerve, left upper limb: Secondary | ICD-10-CM | POA: Diagnosis not present

## 2014-12-01 DIAGNOSIS — M25632 Stiffness of left wrist, not elsewhere classified: Secondary | ICD-10-CM | POA: Diagnosis present

## 2014-12-02 NOTE — Therapy (Signed)
[facility] [address] [place], [address] Phone: [phone]   Fax:  [phone]  Physical Therapy Evaluation  Patient Details  Name: Traci Davis MRN: [id number] Date of Birth: 1970-11-02 Referring Provider:  Ermalene Postin, DO  Encounter Date: 12/01/2014      PT End of Session - 12/01/14 1652    Visit Number 1   Number of Visits 24   Date for PT Re-Evaluation 02/23/15   Authorization Type 1   Authorization Time Period 10   PT Start Time 1543   PT Stop Time 1645   PT Time Calculation (min) 62 min   Activity Tolerance Patient tolerated treatment well   Behavior During Therapy [facility] for tasks assessed/performed      Past Medical History  Diagnosis Date  . Anxiety   . COPD (chronic obstructive pulmonary disease)   . Asthma   . Arthritis   . Depression   . Diabetes mellitus without complication   . Emphysema of lung   . GERD (gastroesophageal reflux disease)   . Hypertension     Past Surgical History  Procedure Laterality Date  . Joint replacement Right     both knees partial replacements dates unknown  . Cesarean section  1995    There were no vitals filed for this visit.  Visit Diagnosis:  Pain in both knees - Plan: PT plan of care cert/re-cert  Muscle weakness (generalized) - Plan: PT plan of care cert/re-cert  Difficulty walking - Plan: PT plan of care cert/re-cert      Subjective Assessment - 12/01/14 1600    Subjective Patient reports she is having increased difficulty with walking due to swelling in left LE more than right LE. She has also noticed a decline in activity and strength since diagnosed with diabetes mellitus May 2016. She has a brace to wear on left LE however it is being repaired and will not be available until later this month. She can't stand for > 10 min. to perform household chores such as washing dishes and personal care.    Pertinent History Patient  reports she has had problems with walking for years with worsening symptoms in knees and difficulty with walking and she underwent bilateral partial knee replacements to help with walking. Since her knee surgeries she has had increasing pain in left knee and has recently had to have a brace made to assist with left LE support. She is taking medication for muscle spasims to assist with decreasing tone in left LE with good results.    Limitations Standing   How long can you stand comfortably? 10 min.   How long can you walk comfortably? short distances (<25 feet) with walker and using medication   Patient Stated Goals Patient would like to be able to have less swelling, improved flexibiltiy,  and be able to walk with less difficulty   Currently in Pain? 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  No   Is the patient reluctant to leave their home because of a fear of falling?  No   Home Environment   Living Environment Private residence   Living Arrangements Spouse/significant other   Type of [place] One level   [place] - 2 wheels;Walker - standard;Wheelchair - power;Bedside commode;Tub bench;Toilet  riser;Hand held shower head   Prior Function   Level of Independence Independent with gait;Independent with transfers;Independent with household mobility with device     Gait: forward flexed posture, IR at hips, increased knee flexion, using walker intermittently for household ambulation short distances:currently not walking because she does not have working knee brace Flexibility: decreased Hamstring length with SLR to 30 degrees bilaterally (increased tone) Tone: increased tone/spaticity in both LE's  PROM/AAROM: hip flexion to 90 degrees bilaterally, ER both hips right 50% decreased, left 25% decreased, limited hip abduction 50% Strength: not formally tested due to pain/increased tone: grossly decreased strength throughout both LE's major muscle groups       PT Education - 12/01/14 1643    Education provided Yes   Education Details Educated in the need for her to perform stretching and ROM exercise to improve flexibility in both LE's and trunk   Person(s) Educated Patient   Methods Explanation;Demonstration;Verbal cues   Comprehension Verbalized understanding             PT Long Term Goals - 12/02/14 0912    PT LONG TERM GOAL #1   Title Patient will demonstrate improved endurance and strength with standing for household activties in kitchen for 15 min. without having to rest by 12/30/2014   Baseline Patient requires rest periods to complete kitchen tasks involving standing for 10-15 min.   Status New   PT LONG TERM GOAL #2   Title Patient will improve ROM in both hips with SLR to 45 degrees for improved mobility with transfers and walking with improved gait pattern by 01/26/2015   Baseline decreased Hamstring length with SLR to 30 degrees bilaterally (increased tone)   Status New   PT LONG TERM GOAL #3   Title Patient will be able to perform basic stretches for LE's hip ER and SLR with use of stability ball at home with minimal assist by 12/30/2014   Baseline Patient unable to  perform exercises without assistance due to tone and decreased flexiblity and lack of help at home   Status New   PT [place] #4   Title Patient will demonstrate improved gait pattern with more erect posture with use of appropriate assistive device x 25 feet with mild fatigue by 01/26/15   Baseline Gait: forward flexed posture, IR at hips, increased knee flexion, using walker intermittently for household ambulation short distances:currently not walking because she does not have working knee brace   Status New   PT LONG TERM GOAL #5   Title Patient will demonstrate improved endurance and strength with standing for household activties in [place] for 20 min. or > without rest by 02/24/2015   Baseline unable  to stand and perform activities for >10-5 min.without multiple rest periods   Status New   Additional Long Term Goals   Additional Long Term Goals Yes   PT LONG TERM GOAL #6   Title Patient will demonstrate improved flexibility and able to control tone with exercises: SLR to 60 degrees or > for improved mobility with transfers/walking by 02/24/2015   Baseline SLR 30 degreees with increased tone    Status New   PT LONG TERM GOAL #7   Title Patient will be independent with self management of exercises and control of tone with home program by 02/24/2015   Baseline Patient is limited in knowledge and abiltiy to perform exercises independently with minimal family support   Status New               Plan - 12/10/2014 1622    Clinical Impression Statement Patient is a 44 year old right hand dominant female who presents with long history of spastic cerebral palsy with difficulty walking. She presents with increased tone in both LE's, mild swelling in both lower legs and arrived in wheelchair without leg brace or assistive device for walking. She has decreased flexibilty in both hips, knees and ankles due to increased tone and lack of consistent stretching. Patient has difficulty performing  exercises by herself and has limited help at home to assist with exercises. She will benefit from physical therapy intervention to address decreased flexiblity and strength in order to be able to manage household activites and personal care tasks with less difficulty and improved safety and confidence.    Pt will benefit from skilled therapeutic intervention in order to improve on the following deficits Decreased strength;Difficulty walking;Impaired flexibility;Decreased mobility;Decreased range of motion;Pain;Impaired tone   Rehab Potential Fair   Clinical Impairments Affecting Rehab Potential (+) motivated, age, previous good results with therapy intervention   (-) chronic condition/pain, multiple surgeries both LE's, significant hypertonicity/spasticity due to Cerebral Palsy   PT Frequency 2x / week   PT Duration 12 weeks   PT Treatment/Interventions Gait training;Manual techniques;Therapeutic exercise;Electrical Stimulation;Moist Heat;Patient/family education;Passive range of motion;Ultrasound   PT Next Visit Plan flexiblity and strengthening exercises with assistance, estim./US  for pain control   PT Home Exercise Plan self stretching/ROM for LE's and trunk rotations and stabilization in supine hook lying and side lying clam   Consulted and Agree with Plan of Care Patient          G-Codes - December 10, 2014 1630    Functional Assessment Tool Used LEFS, clinical judgment, ROM and strength deficits, pain scale   Functional Limitation Mobility: Walking and moving around   Mobility: Walking and Moving Around Current Status (H8887) At least 60 percent but less than 80 percent impaired, limited or restricted   Mobility: Walking and Moving Around Goal Status [phone]) At least 20 percent but less than 40 percent impaired, limited or restricted       Problem List There are no active problems to display for this patient.   Jomarie Longs PT 12/02/2014, 9:45 AM  [facility] [address], [place], [address] Phone: [phone]   Fax:  [phone]

## 2014-12-06 ENCOUNTER — Ambulatory Visit: Payer: Medicare Other | Admitting: Physical Therapy

## 2014-12-07 ENCOUNTER — Ambulatory Visit: Payer: Medicare Other | Admitting: Physical Therapy

## 2014-12-10 ENCOUNTER — Encounter: Payer: Self-pay | Admitting: Physical Therapy

## 2014-12-10 ENCOUNTER — Ambulatory Visit: Payer: Medicare Other | Admitting: Physical Therapy

## 2014-12-10 ENCOUNTER — Ambulatory Visit: Payer: Medicare Other | Admitting: Occupational Therapy

## 2014-12-10 DIAGNOSIS — M6281 Muscle weakness (generalized): Secondary | ICD-10-CM

## 2014-12-10 DIAGNOSIS — M25562 Pain in left knee: Principal | ICD-10-CM

## 2014-12-10 DIAGNOSIS — G5612 Other lesions of median nerve, left upper limb: Secondary | ICD-10-CM | POA: Diagnosis not present

## 2014-12-10 DIAGNOSIS — M25632 Stiffness of left wrist, not elsewhere classified: Secondary | ICD-10-CM

## 2014-12-10 DIAGNOSIS — M25561 Pain in right knee: Secondary | ICD-10-CM

## 2014-12-10 DIAGNOSIS — R262 Difficulty in walking, not elsewhere classified: Secondary | ICD-10-CM

## 2014-12-10 NOTE — Therapy (Signed)
[place] [facility] [address] [place], [address] Phone: [phone]   Fax:  [phone]  Physical Therapy Treatment  Patient Details  Name: Traci Davis MRN: [id number] Date of Birth: 10-27-1970 Referring Provider:  Ermalene Postin, DO  Encounter Date: 12/10/2014      PT End of Session - 12/10/14 1205    Visit Number 2   Number of Visits 24   Date for PT Re-Evaluation 02/23/15   Authorization Type 2   Authorization Time Period 10   PT Start Time 1050   PT Stop Time 1135   PT Time Calculation (min) 45 min   Activity Tolerance Patient limited by pain;Patient tolerated treatment well   Behavior During Therapy [facility] for tasks assessed/performed      Past Medical History  Diagnosis Date  . Anxiety   . COPD (chronic obstructive pulmonary disease)   . Asthma   . Arthritis   . Depression   . Diabetes mellitus without complication   . Emphysema of lung   . GERD (gastroesophageal reflux disease)   . Hypertension     Past Surgical History  Procedure Laterality Date  . Joint replacement Right     both knees partial replacements dates unknown  . Cesarean section  1995    There were no vitals filed for this visit.  Visit Diagnosis:  Pain in both knees  Muscle weakness (generalized)  Difficulty walking      Subjective Assessment - 12/10/14 1050    Subjective Patient reports both hips are sore and tight today and she is still not walking with brace because she still has to get that fixed. She went to see Dr. Landis Gandy and was told there is probable nothing else that can be done surgically to help wtih her knee pain and that bracing is the best option at this time.    Currently in Pain? Yes   Pain Score 7    Pain Location Knee   Pain Orientation --  both knees and hips    Pain Descriptors / Indicators Aching   Pain Type Chronic pain       Objective: Gait: ambulating with rolling walker  (front wheels only) with forward flexed at hips, left knee collapsing inwardly Palpation; point tender along gluteus medius and pirirformis muscles bilaterally AROM: decreased ER left hip with patient lying on right side: 0 degrees of motion pre treatment and increased tone noted both LE's, decreased hamstring length with increased tone bilaterally                  [facility] Adult PT Treatment/Exercise - 12/10/14 1135    Exercises   Exercises Other Exercises   Other Exercises  side lying clam with assistance x 5 reps each LE (following STM), supine hip flexor stretching with assistance x 3 reps each, ER/IR ROM exercises in supine lying each LE, folllowed by ambulation with walking with concentration on more erect poture and increase hip extension with decresaed hip flexion in stance phase   Manual Therapy   Manual Therapy Soft tissue mobilization   Manual therapy comments increased muscles spasms and trigger points along both hip/gluteal regions, gluteus medius and piriformis muscles primarily with decreased active and passive hip ER noted pre treatment   Soft tissue mobilization STM with patient in sidelying with pillow between knees, 12 min. each hip region followed by stretching/AROM exercises       Patient response to treatment: Patient demonstrated improved  AROM left hip ER by 20 degrees (from 0 degrees pre treatment left hip)  following STM to left hip muscles. Improved posture with decreased flexion as hips with ambulation following treatment. verbalized understanding of exercises she is able to perform at home         PT Education - 12/10/14 1204    Education provided Yes   Education Details educated patient in exercises to perform at home to maintain gains in ROM in ER and hip extension and educated patient in the need to keep stretching and moving in appropriate directions to improve walking with less pain/difficulty   Person(s) Educated Patient   Methods  Explanation;Demonstration;Verbal cues;Handout   Comprehension Verbalized understanding;Returned demonstration;Verbal cues required             PT Long Term Goals - 12/02/14 0912    PT LONG TERM GOAL #1   Title Patient will demonstrate improved endurance and strength with standing for household activties in kitchen for 15 min. without having to rest by 12/30/2014   Baseline Patient requires rest periods to complete kitchen tasks involving standing for 10-15 min.   Status New   PT LONG TERM GOAL #2   Title Patient will improve ROM in both hips with SLR to 45 degrees for improved mobility with transfers and walking with improved gait pattern by 01/26/2015   Baseline decreased Hamstring length with SLR to 30 degrees bilaterally (increased tone)   Status New   PT LONG TERM GOAL #3   Title Patient will be able to perform basic stretches for LE's hip ER and SLR with use of stability ball at home with minimal assist by 12/30/2014   Baseline Patient unable to perform exercises without assistance due to tone and decreased flexiblity and lack of help at home   Status New   PT [place] #4   Title Patient will demonstrate imrpoved gait pattern with more erect posture with use of appropriate assistive device x 25 feet with mild fatigue by 01/26/15   Baseline Gait: forward flexed posture, IR at hips, incresed knee flexion, using walker intermittently for household ambulation short distances:currently not walking because she does not have working knee brace   Status New   PT LONG TERM GOAL #5   Title Patient will demonstrate imrpoved endurance and strength with standing for household activties in [place] for 20 min. or > without rest by 02/24/2015   Baseline unable to stand and perform activities for >10-5 min.without multiple rest periods   Status New   Additional Long Term Goals   Additional Long Term Goals Yes   PT LONG TERM GOAL #6   Title Patient will demonstrate improved flexiblity and able  to control tone with exercises: SLR to 60 degrees or > for improved mobility with transfers/walking by 02/24/2015   Baseline SLR 30 degreees with increased tone    Status New   PT LONG TERM GOAL #7   Title Patient will be independent with self management of exercises and control of tone with home program by 02/24/2015   Baseline Patient is limited in knowledge and abiltiy to perform exercises independently with minimal family support   Status New               Plan - 12/10/14 1206    Clinical Impression Statement Patient demonstrated improved AROM left hip ER following STM to left hip muscles. Improved posture with decreased flexion as hips with ambulation following treatment. verbalized understanding of exercises she is able  to perform at home without assistance of others so she can begin to self manage muscle spasms/joint stiffness in order to be able to stand and walk with less difficulty.   Pt will benefit from skilled therapeutic intervention in order to improve on the following deficits Decreased strength;Difficulty walking;Impaired flexibility;Decreased mobility;Decreased range of motion;Pain;Impaired tone   Rehab Potential Fair   PT Frequency 2x / week   PT Duration 12 weeks   PT Treatment/Interventions Gait training;Manual techniques;Therapeutic exercise;Electrical Stimulation;Moist Heat;Patient/family education;Passive range of motion;Ultrasound   PT Next Visit Plan flexiblity and strengthening exercises with assistance, estim./US  for pain control   PT Home Exercise Plan added self stretching for hip flexors and clam for ER in sidelying        Problem List There are no active problems to display for this patient.   Jomarie Longs PT 12/10/2014, 12:14 PM  [place] [facility] [address], [place], [address] Phone: [phone]   Fax:  [phone]

## 2014-12-10 NOTE — Patient Instructions (Addendum)
Contrast bath Wrist splint at all times except ADL's Tendon gliding  Med N glides  Wrist flexor stretch  Place and hold wrist extention - composite PROM for sup   Padded glove for use when walking with walker

## 2014-12-10 NOTE — Therapy (Signed)
[place] [facility] [address] [place], [address] Phone: [phone]   Fax:  [phone]  Occupational Therapy Treatment  Patient Details  Name: Traci Davis MRN: [id number] Date of Birth: 1971/01/11 Referring Provider:  Ermalene Postin, DO  Encounter Date: 12/10/2014      OT End of Session - 12/10/14 2034    Visit Number 1   Number of Visits 4   Date for OT Re-Evaluation 01/07/15   OT Start Time 1155   OT Stop Time 1250   OT Time Calculation (min) 55 min   Activity Tolerance Patient tolerated treatment well   Behavior During Therapy [facility] for tasks assessed/performed      Past Medical History  Diagnosis Date  . Anxiety   . COPD (chronic obstructive pulmonary disease)   . Asthma   . Arthritis   . Depression   . Diabetes mellitus without complication   . Emphysema of lung   . GERD (gastroesophageal reflux disease)   . Hypertension     Past Surgical History  Procedure Laterality Date  . Joint replacement Right     both knees partial replacements dates unknown  . Cesarean section  1995    There were no vitals filed for this visit.  Visit Diagnosis:  Median sensory neuropathy, left - Plan: Ot plan of care cert/re-cert  Stiffness of wrist joint, left - Plan: Ot plan of care cert/re-cert      Subjective Assessment - 12/10/14 2022    Subjective  Had the numbness for about 3 months - was reasonly diagnose with DM - it comes and goes - only thumb and index/middle diigts   Patient Stated Goals IF it is my DM with neuropathy , or CTS    Currently in Pain? Yes   Pain Score 7    Pain Location Knee            [facility] OT Assessment - 12/10/14 0001    Assessment   Diagnosis CTS   Onset Date 08/30/14   Assessment Pt report symptoms just starrted about 3 months ago - she gained about 30 lbs the last year and had knee surgery , braces broken and had to push thru her L hand more with walking,  increase flexor tone in  L forearm - numbness started about 3 months but cannot relate to certain act or AM   Home  Environment   Lives With --  Boyfriend   Prior Function   Leisure spend a lot of time outside, playing with puppy, some cooking and visit with friends    ADL   ADL comments needs assistance for socks and shoes L more than R    Sensation   Additional Comments pins and needles in thumb, 2nd and 3rd - increase with Phalens test    AROM   Right Wrist Extension 65 Degrees   Right Wrist Flexion 70 Degrees   Left Wrist Extension 33 Degrees   Left Wrist Flexion 60 Degrees   Strength   Right Hand Grip (lbs) 32   Right Hand Lateral Pinch 13 lbs   Right Hand 3 Point Pinch 14 lbs   Left Hand Grip (lbs) 30   Left Hand Lateral Pinch 10 lbs   Left Hand 3 Point Pinch 14 lbs        See pt instruction for there ex done this date with pt  Wrist splint fitted too  OT Education - 12/10/14 2033    Education provided Yes   Education Details HEP and CTS symptoms    Person(s) Educated Patient   Methods Explanation;Demonstration;Tactile cues;Verbal cues;Handout   Comprehension Returned demonstration;Verbalized understanding;Verbal cues required          OT Short Term Goals - 12/10/14 2039    OT SHORT TERM GOAL #1   Title Pt report decrease episodes of numbness during day    Time 2   Period Weeks   Status New   OT SHORT TERM GOAL #2   Title Pt to be ind in HEP to increaes ROM at L wrist and decrease episodes of numnbess using splint too   Baseline NO knowledge of HEP , no splint            OT Long Term Goals - 12/10/14 2041    OT LONG TERM GOAL #1   Title Wrist extention and supination improve with 5-10 degrees to decrease episondes of numbness   Baseline Wrist ext 33 L, R 60, sup decrease   Time 4   Period Weeks   Status New               Plan - 12/10/14 2035    Clinical Impression Statement Pt was diagnose reasonly with DM -  no cervical complains - she did had gain of 30 lbs the last year, increase weight thru hands with wallking on walker and braces broken a lot this past year - she is positive Phalens text but no [place] - she had increase flexor  tone in L forearm with decrease wrist extention and supination - that all comtributed to CTS - pt was ed on HEP and fitted with wrist spliint to wear at night time and as much during day - also padded glove to use with walker,    Pt will benefit from skilled therapeutic intervention in order to improve on the following deficits (Retired) Impaired flexibility;Impaired sensation;Impaired tone;Impaired UE functional use;Decreased range of motion   Rehab Potential Fair   OT Frequency 1x / week   OT Duration 4 weeks   OT Treatment/Interventions Self-care/ADL training;Contrast Bath;Ultrasound;Therapeutic exercise;Passive range of motion;Manual Therapy;Splinting;Patient/family education   Plan assess HEP , splint wearing and symptoms , ROM    OT Home Exercise Plan see pt instruction   Consulted and Agree with Plan of Care Patient        Problem List There are no active problems to display for this patient.   Rosalyn Gess OTR/L,CLT 12/10/2014, 8:50 PM  [place] [facility] [address] [place], [address] Phone: [phone]   Fax:  [phone]

## 2014-12-13 ENCOUNTER — Encounter: Payer: Self-pay | Admitting: Physical Therapy

## 2014-12-13 ENCOUNTER — Ambulatory Visit: Payer: Medicare Other | Admitting: Physical Therapy

## 2014-12-13 DIAGNOSIS — M6281 Muscle weakness (generalized): Secondary | ICD-10-CM

## 2014-12-13 DIAGNOSIS — M25561 Pain in right knee: Secondary | ICD-10-CM

## 2014-12-13 DIAGNOSIS — M25562 Pain in left knee: Principal | ICD-10-CM

## 2014-12-13 DIAGNOSIS — G5612 Other lesions of median nerve, left upper limb: Secondary | ICD-10-CM | POA: Diagnosis not present

## 2014-12-13 DIAGNOSIS — R262 Difficulty in walking, not elsewhere classified: Secondary | ICD-10-CM

## 2014-12-14 NOTE — Therapy (Signed)
[place] [facility] [address] [place], [address] Phone: [phone]   Fax:  [phone]  Physical Therapy Treatment  Patient Details  Name: Traci Davis MRN: [id number] Date of Birth: Sep 13, 1970 Referring Provider:  Ermalene Postin, DO  Encounter Date: 12/13/2014      PT End of Session - 12/13/14 1652    Visit Number 3   Number of Visits 24   Date for PT Re-Evaluation 02/23/15   Authorization Type 3   Authorization Time Period 10   PT Start Time 1610   PT Stop Time 1650   PT Time Calculation (min) 40 min   Activity Tolerance Patient tolerated treatment well   Behavior During Therapy [facility] for tasks assessed/performed      Past Medical History  Diagnosis Date  . Anxiety   . COPD (chronic obstructive pulmonary disease)   . Asthma   . Arthritis   . Depression   . Diabetes mellitus without complication   . Emphysema of lung   . GERD (gastroesophageal reflux disease)   . Hypertension     Past Surgical History  Procedure Laterality Date  . Joint replacement Right     both knees partial replacements dates unknown  . Cesarean section  1995    There were no vitals filed for this visit.  Visit Diagnosis:  Pain in both knees  Muscle weakness (generalized)  Difficulty walking      Subjective Assessment - 12/13/14 1611    Subjective Patient reports both hips are sore and tight today and she is still not walking with brace because she still has to get that fixed. She is trying to walk more at home and is exercising on side to get rotation in hip better. She does not have any help at home.    Currently in Pain? Yes   Pain Score 4    Pain Location Knee   Pain Orientation Other (Comment)  both hips and knees   Pain Descriptors / Indicators Aching   Pain Type Chronic pain   Pain Onset More than a month ago      Objective: Gait: ambulating with rolling walker (front wheels only) into clinic  independently, spatic gait pattern with left knee medially collapsing, (is not wearing knee brace), right LE internally rotated at hip with inversion at ankle Tone: increased both LE's throughout hip/knee/ankle with decreased ROM at both hips 50% for ER/abduction, ankle DF         [facility] Adult PT Treatment/Exercise - 12/13/14 1612    Exercises   Exercises Other Exercises   Other Exercises   supine hip flexor stretching with assistance x 3 reps each, ER/IR ROM exercises in supine lying each LE, hip abduction with resistance x 10 reps each LE, sitting exercises for core strengthening/control: with resistive band lumbar extension, bilateral scapular adduction and lat pull downs with assistance of therapist and resistive band 2 sets x 15 reps each,  folllowed by ambulation with walking with concentration on more erect poture and increase hip extension with decresaed hip flexion in stance phase,                    Patient response to treatment: improved flexibility in both hips ER and abduction as compared to previous session, improved control and strength noted with increased manual resistance given, gait pattern improved with decreased IR at hips and able to stand with improved eerect posture with less difficulty  PT Education - 12/13/14 1650    Education provided Yes   Education Details Educated to work on consistency with home program for stretches and strengthening as she can without another's help   Person(s) Educated Patient   Methods Explanation   Comprehension Verbalized understanding             PT Long Term Goals - 12/02/14 0912    PT LONG TERM GOAL #1   Title Patient will demonstrate improved endurance and strength with standing for household activties in kitchen for 15 min. without having to rest by 12/30/2014   Baseline Patient requires rest periods to complete kitchen tasks involving standing for 10-15 min.   Status New   PT LONG TERM GOAL #2   Title Patient  will improve ROM in both hips with SLR to 45 degrees for improved mobility with transfers and walking with improved gait pattern by 01/26/2015   Baseline decreased Hamstring length with SLR to 30 degrees bilaterally (increased tone)   Status New   PT LONG TERM GOAL #3   Title Patient will be able to perform basic stretches for LE's hip ER and SLR with use of stability ball at home with minimal assist by 12/30/2014   Baseline Patient unable to perform exercises without assistance due to tone and decreased flexiblity and lack of help at home   Status New   PT [place] #4   Title Patient will demonstrate imrpoved gait pattern with more erect posture with use of appropriate assistive device x 25 feet with mild fatigue by 01/26/15   Baseline Gait: forward flexed posture, IR at hips, incresed knee flexion, using walker intermittently for household ambulation short distances:currently not walking because she does not have working knee brace   Status New   PT LONG TERM GOAL #5   Title Patient will demonstrate imrpoved endurance and strength with standing for household activties in [place] for 20 min. or > without rest by 02/24/2015   Baseline unable to stand and perform activities for >10-5 min.without multiple rest periods   Status New   Additional Long Term Goals   Additional Long Term Goals Yes   PT LONG TERM GOAL #6   Title Patient will demonstrate improved flexiblity and able to control tone with exercises: SLR to 60 degrees or > for improved mobility with transfers/walking by 02/24/2015   Baseline SLR 30 degreees with increased tone    Status New   PT LONG TERM GOAL #7   Title Patient will be independent with self management of exercises and control of tone with home program by 02/24/2015   Baseline Patient is limited in knowledge and abiltiy to perform exercises independently with minimal family support   Status New               Plan - 12/13/14 1655    Clinical Impression  Statement Patient demonstrated improved flexiblity in both hips for ER and abduction with less difficulty and decreased tone restrictions. She continues with limitations of increased tone/spasticity with decreased strength in both LE's and will benefit from continued physical therapy intervention to address limitations in order to imrpove function with standing/walking activties for daily tasks at home and in community.   Pt will benefit from skilled therapeutic intervention in order to improve on the following deficits Decreased strength;Difficulty walking;Impaired flexibility;Decreased mobility;Decreased range of motion;Pain;Impaired tone   Rehab Potential Fair   PT Frequency 2x / week   PT Duration 12 weeks   PT Treatment/Interventions  Gait training;Manual techniques;Therapeutic exercise;Electrical Stimulation;Moist Heat;Patient/family education;Passive range of motion;Ultrasound   PT Next Visit Plan flexiblity and strengthening exercises with assistance, estim./US  for pain control        Problem List There are no active problems to display for this patient.   Jomarie Longs PT 12/14/2014, 6:05 PM  [place] [facility] [address], [place], [address] Phone: [phone]   Fax:  [phone]

## 2014-12-15 ENCOUNTER — Encounter: Payer: Medicare Other | Admitting: Physical Therapy

## 2014-12-16 ENCOUNTER — Ambulatory Visit: Payer: Medicare Other | Admitting: Physical Therapy

## 2014-12-20 ENCOUNTER — Ambulatory Visit: Payer: Medicare Other | Admitting: Occupational Therapy

## 2014-12-20 DIAGNOSIS — M25632 Stiffness of left wrist, not elsewhere classified: Secondary | ICD-10-CM

## 2014-12-20 DIAGNOSIS — G5612 Other lesions of median nerve, left upper limb: Secondary | ICD-10-CM

## 2014-12-20 NOTE — Patient Instructions (Signed)
Padded gloves to use with walker  Prefab wrist splint most all the time   Same HEP for wrist extention and supination stretch  Place and hold wrist extentoin  Med N glides- but only 4 steps

## 2014-12-20 NOTE — Therapy (Signed)
[facility] [address] [place], [address] Phone: [phone]   Fax:  [phone]  Occupational Therapy Treatment  Patient Details  Name: Traci Davis MRN: [id number] Date of Birth: 11-09-1970 Referring Provider:  Ermalene Postin, DO  Encounter Date: 12/20/2014      OT End of Session - 12/20/14 1235    Visit Number 2   Number of Visits 4   Date for OT Re-Evaluation 01/07/15   OT Start Time 1140   OT Stop Time 1221   OT Time Calculation (min) 41 min   Activity Tolerance Patient tolerated treatment well   Behavior During Therapy [facility] for tasks assessed/performed      Past Medical History  Diagnosis Date  . Anxiety   . COPD (chronic obstructive pulmonary disease)   . Asthma   . Arthritis   . Depression   . Diabetes mellitus without complication   . Emphysema of lung   . GERD (gastroesophageal reflux disease)   . Hypertension     Past Surgical History  Procedure Laterality Date  . Joint replacement Right     both knees partial replacements dates unknown  . Cesarean section  1995    There were no vitals filed for this visit.  Visit Diagnosis:  Median sensory neuropathy, left  Stiffness of wrist joint, left      Subjective Assessment - 12/20/14 1156    Subjective  No numbness at nigh time - use my splint most all the time - only felt my hand go numnb yesterday when I was picking up cup from the top - I did not get glove yet to wear when using walker to provided some padding    Patient Stated Goals IF it is my DM with neuropathy , or CTS    Currently in Pain? Yes   Pain Score 4    Pain Location Wrist   Pain Orientation Left   Pain Descriptors / Indicators Aching   Pain Type Chronic pain   Pain Onset More than a month ago   Pain Frequency Intermittent            [facility] OT Assessment - 12/20/14 0001    AROM   Left Wrist Extension 45 Degrees                  OT  Treatments/Exercises (OP) - 12/20/14 0001    Wrist Exercises   Other wrist exercises PROM /stretch for wrist extention , PROM supination ,Med N glides   LUE Contrast Bath   Time 11 minutes   Comments At [facility] to decrease CT symptoms - and use heat for supination stretch    Splinting   Splinting Cont prefab wrist extention splint at all times except HEP and ADL's    Manual Therapy   Manual therapy comments  CT spreads at contrast to decrease symptoms                 OT Education - 12/20/14 1234    Education provided Yes   Education Details HEP   Person(s) Educated Patient;Child(ren)   Methods Explanation;Demonstration;Tactile cues;Verbal cues   Comprehension Returned demonstration;Verbalized understanding;Verbal cues required          OT Short Term Goals - 12/10/14 2039    OT SHORT TERM GOAL #1   Title Pt report decrease episodes of numbness during day    Time 2   Period Weeks   Status New  OT SHORT TERM GOAL #2   Title Pt to be ind in HEP to increaes ROM at L wrist and decrease episodes of numnbess using splint too   Baseline NO knowledge of HEP , no splint            OT Long Term Goals - 12/10/14 2041    OT LONG TERM GOAL #1   Title Wrist extention and supination improve with 5-10 degrees to decrease episondes of numbness   Baseline Wrist ext 33 L, R 60, sup decrease   Time 4   Period Weeks   Status New               Plan - 12/20/14 1236    Clinical Impression Statement Pt report that she did not had any numnbess or pins/needles at night time - and only since last time here had it yesterday when holding cup at the top for while - wrist extention improve but still has increase flexor tone and sup limtied  - pt to cont to  wear wrist splint and then  need to get padded glove for walkeruse - will check on her again in 2 week    Pt will benefit from skilled therapeutic intervention in order to improve on the following deficits (Retired) Impaired  flexibility;Impaired sensation;Impaired tone;Impaired UE functional use;Decreased range of motion   Rehab Potential Fair   OT Frequency Biweekly   OT Duration 4 weeks   OT Treatment/Interventions Self-care/ADL training;Contrast Bath;Ultrasound;Therapeutic exercise;Passive range of motion;Manual Therapy;Splinting;Patient/family education   Plan assess CT symtoms and results last appt with MD    OT Home Exercise Plan see pt instruction   Consulted and Agree with Plan of Care Patient;Family member/caregiver        Problem List There are no active problems to display for this patient.   Rosalyn Gess OTR/L,CLT 12/20/2014, 12:39 PM  [facility] [address] [place], [address] Phone: [phone]   Fax:  [phone]

## 2014-12-21 ENCOUNTER — Encounter: Payer: Medicare Other | Admitting: Physical Therapy

## 2014-12-22 ENCOUNTER — Encounter: Payer: Medicare Other | Admitting: Physical Therapy

## 2014-12-23 ENCOUNTER — Encounter: Payer: Medicare Other | Admitting: Physical Therapy

## 2014-12-23 ENCOUNTER — Ambulatory Visit: Payer: Medicare Other | Admitting: Physical Therapy

## 2014-12-28 ENCOUNTER — Ambulatory Visit: Payer: Medicare Other | Admitting: Physical Therapy

## 2014-12-30 ENCOUNTER — Ambulatory Visit: Payer: Medicare Other | Admitting: Physical Therapy

## 2015-01-04 ENCOUNTER — Encounter: Payer: Self-pay | Admitting: Physical Therapy

## 2015-01-04 ENCOUNTER — Ambulatory Visit: Payer: Medicare Other | Admitting: Occupational Therapy

## 2015-01-04 ENCOUNTER — Ambulatory Visit: Payer: Medicare Other | Attending: Physical Medicine and Rehabilitation | Admitting: Physical Therapy

## 2015-01-04 DIAGNOSIS — M25561 Pain in right knee: Secondary | ICD-10-CM | POA: Insufficient documentation

## 2015-01-04 DIAGNOSIS — M6281 Muscle weakness (generalized): Secondary | ICD-10-CM | POA: Diagnosis present

## 2015-01-04 DIAGNOSIS — R262 Difficulty in walking, not elsewhere classified: Secondary | ICD-10-CM

## 2015-01-04 DIAGNOSIS — M25562 Pain in left knee: Secondary | ICD-10-CM | POA: Diagnosis present

## 2015-01-05 NOTE — Therapy (Signed)
[facility] [address] [place], [address] Phone: [phone]   Fax:  [phone]  Physical Therapy Treatment  Patient Details  Name: Traci Davis MRN: [id number] Date of Birth: Jul 17, 1970 Referring Provider:  Ermalene Postin, DO  Encounter Date: 01/04/2015      PT End of Session - 01/04/15 1610    Visit Number 4   Number of Visits 24   Date for PT Re-Evaluation 02/23/15   Authorization Type 4   Authorization Time Period 10   PT Start Time 1515   PT Stop Time 1600   PT Time Calculation (min) 45 min   Activity Tolerance Patient tolerated treatment well   Behavior During Therapy [facility] for tasks assessed/performed      Past Medical History  Diagnosis Date  . Anxiety   . COPD (chronic obstructive pulmonary disease)   . Asthma   . Arthritis   . Depression   . Diabetes mellitus without complication   . Emphysema of lung   . GERD (gastroesophageal reflux disease)   . Hypertension     Past Surgical History  Procedure Laterality Date  . Joint replacement Right     both knees partial replacements dates unknown  . Cesarean section  1995    There were no vitals filed for this visit.  Visit Diagnosis:  Pain in both knees  Muscle weakness (generalized)  Difficulty walking      Subjective Assessment - 01/04/15 1526    Subjective Patient reports she is very stiff in both legs today and needs a lot of stretching. Her left knee is in a lot of pain in the kneecap due to hitting it against something.    Limitations Standing;Walking   Patient Stated Goals Patient would like to be able to have less swelling, improved flexibiltiy,  and be able to walk with less difficulty   Currently in Pain? Yes   Pain Score 6    Pain Location Knee   Pain Orientation Left   Pain Descriptors / Indicators Aching   Pain Type Chronic pain   Pain Onset More than a month ago   Pain Frequency Constant      Objective: Gait: ambulating with rolling walker with IR at hips, spastic gait pattern, collapsing left knee medially Palpation: increased spasms bilateral LE's quads/hamstring and calf muscles with limited knee extension and decreased ER in hips (50%) on arrival        [facility] Adult PT Treatment/Exercise - 01/04/15 1528    Exercises   Exercises Other Exercises   Other Exercises  Sitting core exercises with resistive band: scapular rows high and low 2 x 15 reps, supine lying AAROM/stretching for hip ER and hamstring muscles, AAROM for hip abduction x 10 reps each LE following stretching   Manual Therapy   Manual Therapy Soft tissue mobilization   Manual therapy comments increased muscles spasms and trigger points along both LE's quadriceps/hamstring/calf muscles with patient in sitting   Soft tissue mobilization STM with patient in sitting to hamstring and calf muscles followed by stretching/AROM exercises     Patient response to treatment: improved significantly in soft tissue elasticity in LE's which allowed for improved ability to perform stretching exercise and AROM into hip abduction; patient required assistance to perform all exercises due to decreased mobility and increased tone in LE's, improved ability to transfer off treatment table and ambulate with walker with more erect posture and decreased IR at hips  PT Education - 01/04/15 1542    Education provided Yes   Education Details educated in tone changes and muscle tightness and the need to address spasms and tone and frequent movement to stay more flexible in LE's to decresae feelings of stiffness in knees/LE's   Person(s) Educated Patient   Methods Explanation;Demonstration   Comprehension Verbalized understanding             PT Long Term Goals - 12/02/14 0912    PT LONG TERM GOAL #1   Title Patient will demonstrate improved endurance and strength with standing for household activties in kitchen for 15 min.  without having to rest by 12/30/2014   Baseline Patient requires rest periods to complete kitchen tasks involving standing for 10-15 min.   Status New   PT LONG TERM GOAL #2   Title Patient will improve ROM in both hips with SLR to 45 degrees for improved mobility with transfers and walking with improved gait pattern by 01/26/2015   Baseline decreased Hamstring length with SLR to 30 degrees bilaterally (increased tone)   Status New   PT LONG TERM GOAL #3   Title Patient will be able to perform basic stretches for LE's hip ER and SLR with use of stability ball at home with minimal assist by 12/30/2014   Baseline Patient unable to perform exercises without assistance due to tone and decreased flexiblity and lack of help at home   Status New   PT [place] #4   Title Patient will demonstrate imrpoved gait pattern with more erect posture with use of appropriate assistive device x 25 feet with mild fatigue by 01/26/15   Baseline Gait: forward flexed posture, IR at hips, incresed knee flexion, using walker intermittently for household ambulation short distances:currently not walking because she does not have working knee brace   Status New   PT LONG TERM GOAL #5   Title Patient will demonstrate imrpoved endurance and strength with standing for household activties in [place] for 20 min. or > without rest by 02/24/2015   Baseline unable to stand and perform activities for >10-5 min.without multiple rest periods   Status New   Additional Long Term Goals   Additional Long Term Goals Yes   PT LONG TERM GOAL #6   Title Patient will demonstrate improved flexiblity and able to control tone with exercises: SLR to 60 degrees or > for improved mobility with transfers/walking by 02/24/2015   Baseline SLR 30 degreees with increased tone    Status New   PT LONG TERM GOAL #7   Title Patient will be independent with self management of exercises and control of tone with home program by 02/24/2015   Baseline  Patient is limited in knowledge and abiltiy to perform exercises independently with minimal family support   Status New               Plan - 01/04/15 1610    Clinical Impression Statement Iimproved soft tissue elasticity and improved posture with decreased hip and knee flexion in standing. verbalized understanding of the need to move and maintain flexibility throughout the day.    Pt will benefit from skilled therapeutic intervention in order to improve on the following deficits Decreased strength;Difficulty walking;Impaired flexibility;Decreased mobility;Decreased range of motion;Pain;Impaired tone   Rehab Potential Fair   PT Frequency 2x / week   PT Duration 12 weeks   PT Treatment/Interventions Gait training;Manual techniques;Therapeutic exercise;Electrical Stimulation;Moist Heat;Patient/family education;Passive range of motion;Ultrasound   PT Next Visit Plan  flexiblity and strengthening exercises with assistance, estim./US  for pain control        Problem List There are no active problems to display for this patient.   Jomarie Longs PT 01/05/2015, 8:08 AM  [facility] [address] [place], [address] Phone: [phone]   Fax:  [phone]

## 2015-01-06 ENCOUNTER — Ambulatory Visit: Payer: Medicare Other | Admitting: Physical Therapy

## 2015-01-11 ENCOUNTER — Ambulatory Visit: Payer: Medicare Other | Admitting: Physical Therapy

## 2015-01-11 ENCOUNTER — Encounter: Payer: Self-pay | Admitting: Physical Therapy

## 2015-01-11 DIAGNOSIS — M25562 Pain in left knee: Principal | ICD-10-CM

## 2015-01-11 DIAGNOSIS — R262 Difficulty in walking, not elsewhere classified: Secondary | ICD-10-CM

## 2015-01-11 DIAGNOSIS — M25561 Pain in right knee: Secondary | ICD-10-CM

## 2015-01-11 DIAGNOSIS — M6281 Muscle weakness (generalized): Secondary | ICD-10-CM

## 2015-01-11 NOTE — Therapy (Signed)
[facility] [address] [place], [address] Phone: [phone]   Fax:  [phone]  Physical Therapy Treatment  Patient Details  Name: Traci Davis MRN: [id number] Date of Birth: 1971/03/24 Referring Provider:  Ermalene Postin, DO  Encounter Date: 01/11/2015      PT End of Session - 01/11/15 1625    Visit Number 5   Number of Visits 24   Date for PT Re-Evaluation 02/23/15   Authorization Type 5   Authorization Time Period 10   PT Start Time 1540   PT Stop Time 1625   PT Time Calculation (min) 45 min   Activity Tolerance Patient tolerated treatment well   Behavior During Therapy [facility] for tasks assessed/performed      Past Medical History  Diagnosis Date  . Anxiety   . COPD (chronic obstructive pulmonary disease)   . Asthma   . Arthritis   . Depression   . Diabetes mellitus without complication   . Emphysema of lung   . GERD (gastroesophageal reflux disease)   . Hypertension     Past Surgical History  Procedure Laterality Date  . Joint replacement Right     both knees partial replacements dates unknown  . Cesarean section  1995    There were no vitals filed for this visit.  Visit Diagnosis:  Pain in both knees  Muscle weakness (generalized)  Difficulty walking      Subjective Assessment - 01/11/15 1541    Subjective Patient reports she is having sharp shooting pain in the back of her right eye into the top and back of her head. She is scheduled to see eye doctor tomorrow. She continues with stiffness in both LE's which limits walking and standing activities at home. She is going to get her knee brace on Thursday.    Limitations Walking;Standing   Patient Stated Goals Patient would like to be able to have less swelling, improved flexibiltiy,  and be able to walk with less difficulty   Currently in Pain? Yes   Pain Score 4    Pain Location Knee   Pain Orientation Left   Pain  Descriptors / Indicators Aching   Pain Type Chronic pain   Pain Onset More than a month ago        Objective: Gait: ambulating with rolling walker with IR at hips, spastic gait pattern, collapsing left knee medially Palpation: increased spasms bilateral LE's quads/hamstring and calf muscles with limited knee extension and decreased ER and decreased active abduction in hips (50%) on arrival      [facility] Adult PT Treatment/Exercise - 01/11/15 1543    Exercises   Exercises Other Exercises   Other Exercises  AAROM/stretching for hip ER and hamstring muscles, AAROM for hip abduction x 10 reps each LE following stretchingboth LE's   Manual Therapy   Manual Therapy Soft tissue mobilization   Manual therapy comments increased muscle spasms and trigger points along both LE's quadriceps/hamstring/calf muscles with patient in supine decreased active and passive hip ER and abduction noted pre treatment   Soft tissue mobilization STM both LE's quadriceps, hamstring and calf muscles with patient in supine followed by stretching/AROM exercises        Patient response to treatment: improved significantly in soft tissue elasticity in LE's which allowed for improved ability to perform stretching exercise and AROM into hip abduction; patient required assistance to perform all exercises due to decreased mobility and increased tone in LE's,  improved ability to transfer off treatment table and ambulate with walker with more erect posture and decreased IR at hips and improved knee extension bilaterallery         PT Education - 01/11/15 1614    Education provided Yes   Education Details instructed to continue with exercises for home for flexibility, continue to use walker for support during ambulation   Person(s) Educated Patient   Methods Explanation   Comprehension Verbalized understanding             PT Long Term Goals - 12/02/14 0912    PT LONG TERM GOAL #1   Title Patient will demonstrate  improved endurance and strength with standing for household activties in kitchen for 15 min. without having to rest by 12/30/2014   Baseline Patient requires rest periods to complete kitchen tasks involving standing for 10-15 min.   Status New   PT LONG TERM GOAL #2   Title Patient will improve ROM in both hips with SLR to 45 degrees for improved mobility with transfers and walking with improved gait pattern by 01/26/2015   Baseline decreased Hamstring length with SLR to 30 degrees bilaterally (increased tone)   Status New   PT LONG TERM GOAL #3   Title Patient will be able to perform basic stretches for LE's hip ER and SLR with use of stability ball at home with minimal assist by 12/30/2014   Baseline Patient unable to perform exercises without assistance due to tone and decreased flexiblity and lack of help at home   Status New   PT [place] #4   Title Patient will demonstrate imrpoved gait pattern with more erect posture with use of appropriate assistive device x 25 feet with mild fatigue by 01/26/15   Baseline Gait: forward flexed posture, IR at hips, incresed knee flexion, using walker intermittently for household ambulation short distances:currently not walking because she does not have working knee brace   Status New   PT LONG TERM GOAL #5   Title Patient will demonstrate imrpoved endurance and strength with standing for household activties in [place] for 20 min. or > without rest by 02/24/2015   Baseline unable to stand and perform activities for >10-5 min.without multiple rest periods   Status New   Additional Long Term Goals   Additional Long Term Goals Yes   PT LONG TERM GOAL #6   Title Patient will demonstrate improved flexiblity and able to control tone with exercises: SLR to 60 degrees or > for improved mobility with transfers/walking by 02/24/2015   Baseline SLR 30 degreees with increased tone    Status New   PT LONG TERM GOAL #7   Title Patient will be independent with  self management of exercises and control of tone with home program by 02/24/2015   Baseline Patient is limited in knowledge and abiltiy to perform exercises independently with minimal family support   Status New               Plan - 01/11/15 1630    Clinical Impression Statement Patient demonstrated improved flexibiltiy in both LE"s with improved ability to perform hip abduction through increased ROM. Patient demonstrated imporved Gait with more erect posture and improved hip extension and knee extension following session. She continues with limitations in walking and increased tone and will benefit from continued physical therapy intervention to improve function with walking, personal care and household chores.   Pt will benefit from skilled therapeutic intervention in order to improve  on the following deficits Decreased strength;Difficulty walking;Impaired flexibility;Decreased mobility;Decreased range of motion;Pain;Impaired tone   Rehab Potential Fair   PT Frequency 2x / week   PT Duration 12 weeks   PT Treatment/Interventions Gait training;Manual techniques;Therapeutic exercise;Electrical Stimulation;Moist Heat;Patient/family education;Passive range of motion;Ultrasound   PT Next Visit Plan flexiblity and strengthening exercises with assistance, estim./US  for pain control        Problem List There are no active problems to display for this patient.   Jomarie Longs PT 01/11/2015, 10:15 PM  [place] [facility] [address] [place], [address] Phone: [phone]   Fax:  [phone]

## 2015-01-13 ENCOUNTER — Ambulatory Visit: Payer: Medicare Other | Admitting: Physical Therapy

## 2015-01-18 ENCOUNTER — Ambulatory Visit: Payer: Medicare Other | Admitting: Physical Therapy

## 2015-01-20 ENCOUNTER — Ambulatory Visit: Payer: Medicare Other | Admitting: Physical Therapy

## 2015-01-25 ENCOUNTER — Ambulatory Visit: Payer: Medicare Other | Admitting: Physical Therapy

## 2015-01-25 ENCOUNTER — Encounter: Payer: Self-pay | Admitting: Physical Therapy

## 2015-01-25 DIAGNOSIS — M6281 Muscle weakness (generalized): Secondary | ICD-10-CM

## 2015-01-25 DIAGNOSIS — M25561 Pain in right knee: Secondary | ICD-10-CM

## 2015-01-25 DIAGNOSIS — M25562 Pain in left knee: Principal | ICD-10-CM

## 2015-01-25 DIAGNOSIS — R262 Difficulty in walking, not elsewhere classified: Secondary | ICD-10-CM

## 2015-01-26 NOTE — Therapy (Signed)
[place] [facility] [address] [place], [address] Phone: [phone]   Fax:  [phone]  Physical Therapy Treatment  Patient Details  Name: Traci Davis MRN: [id number] Date of Birth: Sep 15, 1970 Referring Cicily Bonano:  Ermalene Postin, DO  Encounter Date: 01/25/2015      PT End of Session - 01/25/15 1618    Visit Number 6   Number of Visits 24   Date for PT Re-Evaluation 02/23/15   Authorization Type 6   Authorization Time Period 10   PT Start Time 1525   PT Stop Time 1615   PT Time Calculation (min) 50 min   Activity Tolerance Patient tolerated treatment well   Behavior During Therapy [facility] for tasks assessed/performed      Past Medical History  Diagnosis Date  . Anxiety   . COPD (chronic obstructive pulmonary disease)   . Asthma   . Arthritis   . Depression   . Diabetes mellitus without complication   . Emphysema of lung   . GERD (gastroesophageal reflux disease)   . Hypertension     Past Surgical History  Procedure Laterality Date  . Joint replacement Right     both knees partial replacements dates unknown  . Cesarean section  1995    There were no vitals filed for this visit.  Visit Diagnosis:  Pain in both knees  Muscle weakness (generalized)  Difficulty walking      Subjective Assessment - 01/25/15 1526    Subjective Patient reports she is seeing improvement with carpal tunnel symptoms with braces. she is going to see MD this week in order for re assessment for pain control medication and to see about getting assistance in the home to assist her for taking a shoulder and to assist with ROM of LE's as she cannot perform ROM exercise without help.    Limitations Walking;Standing   Patient Stated Goals Patient would like to be able to have less swelling, improved flexibiltiy,  and be able to walk with less difficulty   Currently in Pain? Other (Comment)  no pain today due to taking  pain medication this moring  extendedn release oxycontin       Objective: Tone: increased tone both hamstring muscles and calf muscles Gait: ambulating with rolling walker independently with KAFO worn on left LE for knee support: forward flexed posture at hips and trunk, adducting LE's with decreased base of support noted and using UE's to support body weight on walker      [facility] Adult PT Treatment/Exercise - 01/25/15 1529    Exercises   Exercises Other Exercises   Other Exercises  supine hip flexor stretching and adductor stretching with assistance x 3 reps each, ER/IR ROM exercises with LE extended in supine lying each LE, hip extension x 5 reps each isometric holding followed by Hamstring stretching 3 x 30 second holds, sitting trunk stabilization with resistive bands for lumbar extension, bilateral scapular adduction x 15, straight arm pull downs x 15 reps with assistance of therapist for verbal cues and to assist with resistive band,  folllowed by ambulation with walking with concentration on more erect posure and increase hip extension with decresaed hip flexion in stance phase                     Patient response to treatment: improved flexibility in hip adductors, hamstring muscles and right calf, improved control with hip abduction in supine lying and improved gait  pattern in standing and with walking with more erect posture by 25%          PT Education - 01/25/15 1415    Education provided Yes   Education Details discussed standing with brace to perform household tasks in kitchen and with personal care and exercise while standing on right LE while performing left LE abduction   Person(s) Educated Patient   Methods Explanation;Demonstration   Comprehension Verbalized understanding             PT Long Term Goals - 01/25/15 1530    PT LONG TERM GOAL #1   Title Patient will demonstrate improved endurance and strength with standing for household activties in kitchen  for 15 min. without having to rest by 02/25/2015   Baseline Patient requires rest periods to complete kitchen tasks involving standing for 10-15 min. (note just received brace back for left LE today 01/25/2015)   Status Deferred   PT LONG TERM GOAL #2   Title Patient will improve ROM in both hips with SLR to 45 degrees for improved mobility with transfers and walking with improved gait pattern by 01/26/2015   Baseline decreased Hamstring length with SLR to 30 degrees bilaterally (increased tone)   Status Partially Met   PT LONG TERM GOAL #3   Title Patient will be able to perform basic stretches for LE's hip ER and SLR with use of stability ball at home with minimal assist by 10/282016   Baseline Patient unable to perform exercises without assistance due to tone and decreased flexiblity and lack of help at home   Status Deferred   PT LONG TERM GOAL #4   Title Patient will demonstrate imrpoved gait pattern with more erect posture with use of appropriate assistive device x 25 feet with mild fatigue by 01/26/15   Baseline Gait: forward flexed posture, IR at hips, incresed knee flexion, using walker intermittently for household ambulation short distances:currently not walking because she does not have working knee brace   Status On-going   PT LONG TERM GOAL #5   Title Patient will demonstrate imrpoved endurance and strength with standing for household activties in [place] for 20 min. or > without rest by 02/24/2015   Baseline unable to stand and perform activities for >10-5 min.without multiple rest periods   Status Not Met  did not have brace for left LE until 01/25/2015 therefore goal has been deferred   PT LONG TERM GOAL #6   Title Patient will demonstrate improved flexiblity and able to control tone with exercises: SLR to 60 degrees or > for improved mobility with transfers/walking by 02/24/2015   Baseline SLR 30 degreees with increased tone    Status On-going   PT LONG TERM GOAL #7   Title  Patient will be independent with self management of select exercises and control of tone with home program ans assistance as needed by 02/24/2015   Baseline Patient is limited in knowledge and abiltiy to perform exercises independently with minimal family support or outside assistance   Status Revised               Plan - 01/25/15 1534    Clinical Impression Statement Patient is now wearing KAFO with lock brace on left LE which is helping to prevent left knee collapsing medially during stance phase on left. She continues with increased tone and decreased flexibility in hips and knees/ankles and weakness in core and LE's and requires assistance for all exercises.     Pt will  benefit from skilled therapeutic intervention in order to improve on the following deficits Decreased strength;Difficulty walking;Impaired flexibility;Decreased mobility;Decreased range of motion;Pain;Impaired tone   Rehab Potential Fair   PT Frequency 2x / week   PT Duration 12 weeks   PT Treatment/Interventions Gait training;Manual techniques;Therapeutic exercise;Electrical Stimulation;Moist Heat;Patient/family education;Passive range of motion;Ultrasound   PT Next Visit Plan flexiblity and strengthening exercises with assistance, estim./US  for pain control        Problem List There are no active problems to display for this patient.   Jomarie Longs PT 01/26/2015, 10:45 AM  [place] [facility] [address] [place], [address] Phone: [phone]   Fax:  [phone]

## 2015-01-27 ENCOUNTER — Ambulatory Visit: Payer: Medicare Other | Admitting: Physical Therapy

## 2015-02-01 ENCOUNTER — Ambulatory Visit: Payer: Medicare Other | Admitting: Physical Therapy

## 2015-02-03 ENCOUNTER — Ambulatory Visit: Payer: Medicare Other | Admitting: Physical Therapy

## 2015-02-08 ENCOUNTER — Ambulatory Visit: Payer: Medicare Other | Attending: Physical Medicine and Rehabilitation | Admitting: Physical Therapy

## 2015-02-08 ENCOUNTER — Encounter: Payer: Self-pay | Admitting: Physical Therapy

## 2015-02-08 DIAGNOSIS — M25561 Pain in right knee: Secondary | ICD-10-CM | POA: Diagnosis present

## 2015-02-08 DIAGNOSIS — M25562 Pain in left knee: Secondary | ICD-10-CM | POA: Insufficient documentation

## 2015-02-08 DIAGNOSIS — M6281 Muscle weakness (generalized): Secondary | ICD-10-CM | POA: Diagnosis present

## 2015-02-08 DIAGNOSIS — R262 Difficulty in walking, not elsewhere classified: Secondary | ICD-10-CM | POA: Diagnosis present

## 2015-02-08 NOTE — Therapy (Signed)
[place] [facility] [address], [place], [address] Phone: [phone]   Fax:  [phone]  Physical Therapy Treatment  Patient Details  Name: Traci Davis MRN: [id number] Date of Birth: 18-Apr-1971 Referring Provider:  Ermalene Postin, DO  Encounter Date: 02/08/2015      PT End of Session - 02/08/15 1547    Visit Number 7   Number of Visits 24   Date for PT Re-Evaluation 02/23/15   Authorization Type 7   Authorization Time Period 10   PT Start Time 1545   PT Stop Time 1625   PT Time Calculation (min) 40 min   Activity Tolerance Patient tolerated treatment well   Behavior During Therapy [facility] for tasks assessed/performed      Past Medical History  Diagnosis Date  . Anxiety   . COPD (chronic obstructive pulmonary disease) ([place])   . Asthma   . Arthritis   . Depression   . Diabetes mellitus without complication ([place])   . Emphysema of lung ([place])   . GERD (gastroesophageal reflux disease)   . Hypertension     Past Surgical History  Procedure Laterality Date  . Joint replacement Right     both knees partial replacements dates unknown  . Cesarean section  1995    There were no vitals filed for this visit.  Visit Diagnosis:  Muscle weakness (generalized)  Difficulty walking      Subjective Assessment - 02/08/15 1547    Subjective Patient reports she is seeing improvement with carpal tunnel symptoms with braces; she is tired today and has been on prednisone for her eye problem and has put on weight and this seems to be affecting her hands and LE's. She is trying to walk without walker and only using brace on left LE and is unable to at this time. She reports decreased right LE symptoms since being on Mobic.    Patient Stated Goals Patient would like to be able to have less swelling, improved flexibiltiy,  and be able to walk with less difficulty   Currently in Pain? No/denies       Objective: Gait: ambulating into clinic with rolling walker and KAFO in place left LE, required assistance and verbal cuing to place brace in locked knee extension for safety and support Tone: increased both LE's Transfers on/off treatment table with minimal assistance of therapist for LE's Flexibility: decreased both hip flexion, abduction and rotations       [facility] Adult PT Treatment/Exercise - 02/08/15 1549    Exercises   Exercises Other Exercises   Other Exercises  Supine lying with assistance physical therapist to perform all exercises with verbal cuing and assistance: Hip flexion/extension x 10 reps, hamstring stretching x 3 reps, calf stretching x 5 reps, hip abduction with AAROM 10 reps, stability ball under both LE's for lower trunk rotation x 10, hip/knee flexion x 15-20 reps with therapist assisting patient with stabilizing LE's on ball, stabilization with 6# weight for bilateral flexion overhead x 15 reps, sitting trunk stabilization: rhythmic stabilization for flexion/extension x 10 reps, resistive band scapular rows x 15 reps high/low 2 sets, trunk extension with resistive band and verbal cues/guidance for correct posture    Patient response to treatment: improved flexibility in both LE's hip abduction to 50% limited, hip /knee flexion more flexible with decreased stiffness and pain reported by patient, improved gait pattern with more erect posture and weight shifting to left LE  PT Education - 02/08/15 1630    Education provided Yes   Education Details instructed in exercises for home and the reason for trunk stability and exercises to improve hip strength in left LE to be able to walk without walker   Person(s) Educated Patient   Methods Explanation;Verbal cues   Comprehension Verbalized understanding;Verbal cues required             PT Long Term Goals - 01/25/15 1530    PT LONG TERM GOAL #1   Title Patient will demonstrate improved endurance and  strength with standing for household activties in kitchen for 15 min. without having to rest by 02/25/2015   Baseline Patient requires rest periods to complete kitchen tasks involving standing for 10-15 min. (note just received brace back for left LE today 01/25/2015)   Status Deferred   PT LONG TERM GOAL #2   Title Patient will improve ROM in both hips with SLR to 45 degrees for improved mobility with transfers and walking with improved gait pattern by 01/26/2015   Baseline decreased Hamstring length with SLR to 30 degrees bilaterally (increased tone)   Status Partially Met   PT LONG TERM GOAL #3   Title Patient will be able to perform basic stretches for LE's hip ER and SLR with use of stability ball at home with minimal assist by 10/282016   Baseline Patient unable to perform exercises without assistance due to tone and decreased flexiblity and lack of help at home   Status Deferred   PT LONG TERM GOAL #4   Title Patient will demonstrate imrpoved gait pattern with more erect posture with use of appropriate assistive device x 25 feet with mild fatigue by 01/26/15   Baseline Gait: forward flexed posture, IR at hips, incresed knee flexion, using walker intermittently for household ambulation short distances:currently not walking because she does not have working knee brace   Status On-going   PT LONG TERM GOAL #5   Title Patient will demonstrate imrpoved endurance and strength with standing for household activties in [place] for 20 min. or > without rest by 02/24/2015   Baseline unable to stand and perform activities for >10-5 min.without multiple rest periods   Status Not Met  did not have brace for left LE until 01/25/2015 therefore goal has been deferred   PT LONG TERM GOAL #6   Title Patient will demonstrate improved flexiblity and able to control tone with exercises: SLR to 60 degrees or > for improved mobility with transfers/walking by 02/24/2015   Baseline SLR 30 degreees with increased tone     Status On-going   PT LONG TERM GOAL #7   Title Patient will be independent with self management of select exercises and control of tone with home program ans assistance as needed by 02/24/2015   Baseline Patient is limited in knowledge and abiltiy to perform exercises independently with minimal family support or outside assistance   Status Revised               Plan - 02/08/15 1547    Clinical Impression Statement Patient is improving with being able to walk with brace on left LE. She is seeing improvement in right leg with less pain since being on Mobic. She requires assistance and verbal cuing to perform all exercises through full ROM and with proper position and alignment.    Pt will benefit from skilled therapeutic intervention in order to improve on the following deficits Decreased strength;Difficulty walking;Impaired flexibility;Decreased mobility;Decreased range of motion;Pain;Impaired  tone   Rehab Potential Fair   PT Frequency 2x / week   PT Duration 12 weeks   PT Treatment/Interventions Gait training;Manual techniques;Therapeutic exercise;Electrical Stimulation;Moist Heat;Patient/family education;Passive range of motion;Ultrasound   PT Next Visit Plan flexiblity and strengthening exercises with assistance, estim./US  for pain control        Problem List There are no active problems to display for this patient.   Jomarie Longs PT 02/09/2015, 9:59 AM  [place] [facility] [address], [place], [address] Phone: [phone]   Fax:  [phone]

## 2015-02-10 ENCOUNTER — Ambulatory Visit: Payer: Medicare Other | Admitting: Physical Therapy

## 2015-02-14 ENCOUNTER — Ambulatory Visit: Payer: Medicare Other | Admitting: Physical Therapy

## 2015-02-16 ENCOUNTER — Ambulatory Visit: Payer: Medicare Other | Admitting: Physical Therapy

## 2015-02-16 ENCOUNTER — Encounter: Payer: Self-pay | Admitting: Physical Therapy

## 2015-02-16 DIAGNOSIS — M6281 Muscle weakness (generalized): Secondary | ICD-10-CM | POA: Diagnosis not present

## 2015-02-16 DIAGNOSIS — M25561 Pain in right knee: Secondary | ICD-10-CM

## 2015-02-16 DIAGNOSIS — R262 Difficulty in walking, not elsewhere classified: Secondary | ICD-10-CM

## 2015-02-16 DIAGNOSIS — M25562 Pain in left knee: Secondary | ICD-10-CM

## 2015-02-16 NOTE — Therapy (Signed)
[place] [facility] [address], [place], [address] Phone: [phone]   Fax:  [phone]  Physical Therapy Treatment  Patient Details  Name: Traci Davis MRN: [id number] Date of Birth: Apr 06, 1971 Referring Provider: Ermalene Postin, DO  Encounter Date: 02/16/2015      PT End of Session - 02/16/15 1620    Visit Number 8   Number of Visits 24   Date for PT Re-Evaluation 02/23/15   Authorization Type 8   Authorization Time Period 10   PT Start Time 1535   PT Stop Time 1615   PT Time Calculation (min) 40 min   Activity Tolerance Patient limited by fatigue;Patient limited by lethargy   Behavior During Therapy [facility] for tasks assessed/performed      Past Medical History  Diagnosis Date  . Anxiety   . COPD (chronic obstructive pulmonary disease) ([place])   . Asthma   . Arthritis   . Depression   . Diabetes mellitus without complication ([place])   . Emphysema of lung ([place])   . GERD (gastroesophageal reflux disease)   . Hypertension     Past Surgical History  Procedure Laterality Date  . Joint replacement Right     both knees partial replacements dates unknown  . Cesarean section  1995    There were no vitals filed for this visit.  Visit Diagnosis:  Muscle weakness (generalized)  Difficulty walking  Pain in both knees      Subjective Assessment - 02/16/15 1542    Subjective Patient reports she is trying to walk and stand more. She had problems with her glucose levels which are now under control. she is having wheezing at the moment and had a breathing treatment prior to coming in to therapy.    Patient Stated Goals Patient would like to be able to have less swelling, improved flexibiltiy,  and be able to walk with less difficulty   Currently in Pain? No/denies       Objective: Vitals taken: SPO2 98% on arrival, HR 115 (patient had breathing treatment with albuterol just prior to coming to  therapy) Gait: ambulating into clinic using rolling walker and brace on left LE Tone: increased tone in both LE's        [facility] Adult PT Treatment/Exercise - 02/16/15 1548    Exercises   Exercises Other Exercises   Other Exercises  Therapist assisted patient with ROM exercises with patient lying on treatment table: hip/knee flexion/extension, ankle stretches into DF, hip abduction and adduction, UE ROM both shoulders x 10 reps each Discussed with patient and daughter to contact primary care physician for evaluation regarding her wheezing and not feeling well.                     Patient response to treatment: Improved ROM with decreased stiffness in both LE's with improved hip abduction as compared to previous session, HR at end of session 98, SPO2 in reclined position 94%, sitting 98%, limited session of exercises due to patient wheezing           PT Education - 02/16/15 1616    Education provided Yes   Education Details instructed patient and daughter in continuing exercises and to contact MD regarding continued wheezing with breathing since she has COPD   Person(s) Educated Patient   Methods Explanation;Verbal cues   Comprehension Verbalized understanding             PT Long Term  Goals - 01/25/15 1530    PT LONG TERM GOAL #1   Title Patient will demonstrate improved endurance and strength with standing for household activties in kitchen for 15 min. without having to rest by 02/25/2015   Baseline Patient requires rest periods to complete kitchen tasks involving standing for 10-15 min. (note just received brace back for left LE today 01/25/2015)   Status Deferred   PT LONG TERM GOAL #2   Title Patient will improve ROM in both hips with SLR to 45 degrees for improved mobility with transfers and walking with improved gait pattern by 01/26/2015   Baseline decreased Hamstring length with SLR to 30 degrees bilaterally (increased tone)   Status Partially Met   PT LONG TERM  GOAL #3   Title Patient will be able to perform basic stretches for LE's hip ER and SLR with use of stability ball at home with minimal assist by 10/282016   Baseline Patient unable to perform exercises without assistance due to tone and decreased flexiblity and lack of help at home   Status Deferred   PT LONG TERM GOAL #4   Title Patient will demonstrate imrpoved gait pattern with more erect posture with use of appropriate assistive device x 25 feet with mild fatigue by 01/26/15   Baseline Gait: forward flexed posture, IR at hips, incresed knee flexion, using walker intermittently for household ambulation short distances:currently not walking because she does not have working knee brace   Status On-going   PT LONG TERM GOAL #5   Title Patient will demonstrate imrpoved endurance and strength with standing for household activties in [place] for 20 min. or > without rest by 02/24/2015   Baseline unable to stand and perform activities for >10-5 min.without multiple rest periods   Status Not Met  did not have brace for left LE until 01/25/2015 therefore goal has been deferred   PT LONG TERM GOAL #6   Title Patient will demonstrate improved flexiblity and able to control tone with exercises: SLR to 60 degrees or > for improved mobility with transfers/walking by 02/24/2015   Baseline SLR 30 degreees with increased tone    Status On-going   PT LONG TERM GOAL #7   Title Patient will be independent with self management of select exercises and control of tone with home program ans assistance as needed by 02/24/2015   Baseline Patient is limited in knowledge and abiltiy to perform exercises independently with minimal family support or outside assistance   Status Revised               Plan - 02/16/15 1515    Clinical Impression Statement Patient was limited to ROM exercises today due to wheezing and increased heart rate 110-115 following breathing treatment she had prior to coming to therapy.  Patient is to see MD tomorrow for evaluation.    Pt will benefit from skilled therapeutic intervention in order to improve on the following deficits Decreased strength;Difficulty walking;Impaired flexibility;Decreased mobility;Decreased range of motion;Pain;Impaired tone   Rehab Potential Fair   PT Frequency 2x / week   PT Duration 12 weeks   PT Treatment/Interventions Gait training;Manual techniques;Therapeutic exercise;Electrical Stimulation;Moist Heat;Patient/family education;Passive range of motion;Ultrasound   PT Next Visit Plan flexiblity and strengthening exercises with assistance, estim./US  for pain control        Problem List There are no active problems to display for this patient.   Jomarie Longs PT 02/17/2015, 7:51 PM  [place] [facility] [address] [place], [address] Phone: [phone]   Fax:  [phone]  Name: Traci Davis MRN: [id number] Date of Birth: 05-15-1970

## 2015-02-19 ENCOUNTER — Inpatient Hospital Stay: Payer: Medicare Other

## 2015-02-19 ENCOUNTER — Encounter: Payer: Self-pay | Admitting: Emergency Medicine

## 2015-02-19 ENCOUNTER — Inpatient Hospital Stay
Admission: EM | Admit: 2015-02-19 | Discharge: 2015-02-21 | DRG: 683 | Disposition: A | Discharge status: Home / Self Care | Payer: Medicare Other | Attending: Internal Medicine | Admitting: Internal Medicine

## 2015-02-19 DIAGNOSIS — Z7984 Long term (current) use of oral hypoglycemic drugs: Secondary | ICD-10-CM | POA: Diagnosis not present

## 2015-02-19 DIAGNOSIS — E119 Type 2 diabetes mellitus without complications: Secondary | ICD-10-CM | POA: Diagnosis present

## 2015-02-19 DIAGNOSIS — F419 Anxiety disorder, unspecified: Secondary | ICD-10-CM | POA: Diagnosis present

## 2015-02-19 DIAGNOSIS — M545 Low back pain, unspecified: Secondary | ICD-10-CM

## 2015-02-19 DIAGNOSIS — R945 Abnormal results of liver function studies: Secondary | ICD-10-CM

## 2015-02-19 DIAGNOSIS — Z888 Allergy status to other drugs, medicaments and biological substances status: Secondary | ICD-10-CM

## 2015-02-19 DIAGNOSIS — T39395A Adverse effect of other nonsteroidal anti-inflammatory drugs [NSAID], initial encounter: Secondary | ICD-10-CM | POA: Diagnosis present

## 2015-02-19 DIAGNOSIS — K760 Fatty (change of) liver, not elsewhere classified: Secondary | ICD-10-CM | POA: Diagnosis present

## 2015-02-19 DIAGNOSIS — J449 Chronic obstructive pulmonary disease, unspecified: Secondary | ICD-10-CM | POA: Diagnosis present

## 2015-02-19 DIAGNOSIS — I10 Essential (primary) hypertension: Secondary | ICD-10-CM | POA: Diagnosis present

## 2015-02-19 DIAGNOSIS — E669 Obesity, unspecified: Secondary | ICD-10-CM | POA: Diagnosis present

## 2015-02-19 DIAGNOSIS — G809 Cerebral palsy, unspecified: Secondary | ICD-10-CM | POA: Diagnosis present

## 2015-02-19 DIAGNOSIS — J45909 Unspecified asthma, uncomplicated: Secondary | ICD-10-CM | POA: Diagnosis present

## 2015-02-19 DIAGNOSIS — Z885 Allergy status to narcotic agent status: Secondary | ICD-10-CM | POA: Diagnosis not present

## 2015-02-19 DIAGNOSIS — Z96653 Presence of artificial knee joint, bilateral: Secondary | ICD-10-CM | POA: Diagnosis present

## 2015-02-19 DIAGNOSIS — Z79899 Other long term (current) drug therapy: Secondary | ICD-10-CM

## 2015-02-19 DIAGNOSIS — R109 Unspecified abdominal pain: Secondary | ICD-10-CM | POA: Diagnosis present

## 2015-02-19 DIAGNOSIS — T380X5A Adverse effect of glucocorticoids and synthetic analogues, initial encounter: Secondary | ICD-10-CM | POA: Diagnosis present

## 2015-02-19 DIAGNOSIS — N179 Acute kidney failure, unspecified: Secondary | ICD-10-CM | POA: Diagnosis present

## 2015-02-19 DIAGNOSIS — F329 Major depressive disorder, single episode, unspecified: Secondary | ICD-10-CM | POA: Diagnosis present

## 2015-02-19 DIAGNOSIS — M1990 Unspecified osteoarthritis, unspecified site: Secondary | ICD-10-CM | POA: Diagnosis present

## 2015-02-19 DIAGNOSIS — Z91048 Other nonmedicinal substance allergy status: Secondary | ICD-10-CM

## 2015-02-19 DIAGNOSIS — R822 Biliuria: Secondary | ICD-10-CM

## 2015-02-19 DIAGNOSIS — T383X5A Adverse effect of insulin and oral hypoglycemic [antidiabetic] drugs, initial encounter: Secondary | ICD-10-CM | POA: Diagnosis present

## 2015-02-19 DIAGNOSIS — I959 Hypotension, unspecified: Secondary | ICD-10-CM | POA: Diagnosis present

## 2015-02-19 DIAGNOSIS — R740 Nonspecific elevation of levels of transaminase and lactic acid dehydrogenase [LDH]: Secondary | ICD-10-CM | POA: Diagnosis present

## 2015-02-19 DIAGNOSIS — K219 Gastro-esophageal reflux disease without esophagitis: Secondary | ICD-10-CM | POA: Diagnosis present

## 2015-02-19 DIAGNOSIS — Z6841 Body Mass Index (BMI) 40.0 and over, adult: Secondary | ICD-10-CM | POA: Diagnosis not present

## 2015-02-19 DIAGNOSIS — Z79891 Long term (current) use of opiate analgesic: Secondary | ICD-10-CM

## 2015-02-19 DIAGNOSIS — R7989 Other specified abnormal findings of blood chemistry: Secondary | ICD-10-CM

## 2015-02-19 LAB — URINALYSIS COMPLETE WITH MICROSCOPIC (ARMC ONLY)
Glucose, UA: NEGATIVE mg/dL
HGB URINE DIPSTICK: NEGATIVE
Ketones, ur: NEGATIVE mg/dL
Leukocytes, UA: NEGATIVE
Nitrite: NEGATIVE
PH: 5 (ref 5.0–8.0)
Protein, ur: 100 mg/dL — AB
RBC / HPF: NONE SEEN RBC/hpf (ref 0–5)
Specific Gravity, Urine: 1.023 (ref 1.005–1.030)
WBC UA: NONE SEEN WBC/hpf (ref 0–5)

## 2015-02-19 LAB — BASIC METABOLIC PANEL
ANION GAP: 7 (ref 5–15)
BUN: 25 mg/dL — ABNORMAL HIGH (ref 6–20)
CHLORIDE: 103 mmol/L (ref 101–111)
CO2: 27 mmol/L (ref 22–32)
CREATININE: 2.71 mg/dL — AB (ref 0.44–1.00)
Calcium: 7.9 mg/dL — ABNORMAL LOW (ref 8.9–10.3)
GFR calc non Af Amer: 20 mL/min — ABNORMAL LOW (ref >60)
GFR, EST AFRICAN AMERICAN: 23 mL/min — AB (ref >60)
Glucose, Bld: 128 mg/dL — ABNORMAL HIGH (ref 65–99)
POTASSIUM: 3.2 mmol/L — AB (ref 3.5–5.1)
SODIUM: 137 mmol/L (ref 135–145)

## 2015-02-19 LAB — COMPREHENSIVE METABOLIC PANEL
ALBUMIN: 3.3 g/dL — AB (ref 3.5–5.0)
ALT: 100 U/L — ABNORMAL HIGH (ref 14–54)
ANION GAP: 11 (ref 5–15)
AST: 72 U/L — ABNORMAL HIGH (ref 15–41)
Alkaline Phosphatase: 100 U/L (ref 38–126)
BUN: 26 mg/dL — AB (ref 6–20)
CALCIUM: 8.8 mg/dL — AB (ref 8.9–10.3)
CO2: 29 mmol/L (ref 22–32)
CREATININE: 3.33 mg/dL — AB (ref 0.44–1.00)
Chloride: 98 mmol/L — ABNORMAL LOW (ref 101–111)
GFR calc Af Amer: 18 mL/min — ABNORMAL LOW (ref >60)
GFR calc non Af Amer: 16 mL/min — ABNORMAL LOW (ref >60)
Glucose, Bld: 102 mg/dL — ABNORMAL HIGH (ref 65–99)
POTASSIUM: 3.7 mmol/L (ref 3.5–5.1)
SODIUM: 138 mmol/L (ref 135–145)
Total Bilirubin: 0.7 mg/dL (ref 0.3–1.2)
Total Protein: 6.4 g/dL — ABNORMAL LOW (ref 6.5–8.1)

## 2015-02-19 LAB — CBC WITH DIFFERENTIAL/PLATELET
BASOS PCT: 1 %
Basophils Absolute: 0.1 10*3/uL (ref 0–0.1)
EOS ABS: 0.2 10*3/uL (ref 0–0.7)
EOS PCT: 2 %
HCT: 33.3 % — ABNORMAL LOW (ref 35.0–47.0)
Hemoglobin: 10.5 g/dL — ABNORMAL LOW (ref 12.0–16.0)
LYMPHS ABS: 2.7 10*3/uL (ref 1.0–3.6)
Lymphocytes Relative: 23 %
MCH: 23.1 pg — AB (ref 26.0–34.0)
MCHC: 31.6 g/dL — AB (ref 32.0–36.0)
MCV: 73.1 fL — ABNORMAL LOW (ref 80.0–100.0)
Monocytes Absolute: 0.5 10*3/uL (ref 0.2–0.9)
Monocytes Relative: 5 %
Neutro Abs: 8.3 10*3/uL — ABNORMAL HIGH (ref 1.4–6.5)
Neutrophils Relative %: 69 %
PLATELETS: 332 10*3/uL (ref 150–440)
RBC: 4.56 MIL/uL (ref 3.80–5.20)
RDW: 21.1 % — ABNORMAL HIGH (ref 11.5–14.5)
WBC: 11.9 10*3/uL — AB (ref 3.6–11.0)

## 2015-02-19 LAB — GLUCOSE, CAPILLARY: Glucose-Capillary: 70 mg/dL (ref 65–99)

## 2015-02-19 LAB — LIPASE, BLOOD: LIPASE: 14 U/L (ref 11–51)

## 2015-02-19 LAB — MRSA PCR SCREENING: MRSA BY PCR: POSITIVE — AB

## 2015-02-19 LAB — TSH: TSH: 2.891 u[IU]/mL (ref 0.350–4.500)

## 2015-02-19 MED ORDER — SODIUM CHLORIDE 0.9 % IV BOLUS (SEPSIS)
1000.0000 mL | Freq: Once | INTRAVENOUS | Status: AC
  Administered 2015-02-19: 1000 mL via INTRAVENOUS

## 2015-02-19 MED ORDER — PANCRELIPASE (LIP-PROT-AMYL) 12000-38000 UNITS PO CPEP
72000.0000 [IU] | ORAL_CAPSULE | Freq: Three times a day (TID) | ORAL | Status: DC
  Administered 2015-02-19 – 2015-02-21 (×5): 72000 [IU] via ORAL
  Filled 2015-02-19 (×7): qty 6

## 2015-02-19 MED ORDER — ACETAMINOPHEN 650 MG RE SUPP: 650.0000 mg | Freq: Four times a day (QID) | RECTAL | Status: DC | PRN

## 2015-02-19 MED ORDER — KETOROLAC TROMETHAMINE 60 MG/2ML IM SOLN
INTRAMUSCULAR | Status: AC
  Administered 2015-02-19: 60 mg via INTRAMUSCULAR
  Filled 2015-02-19: qty 2

## 2015-02-19 MED ORDER — ARIPIPRAZOLE 2 MG PO TABS
5.0000 mg | ORAL_TABLET | Freq: Every day | ORAL | Status: DC
  Administered 2015-02-19 – 2015-02-21 (×3): 5 mg via ORAL
  Filled 2015-02-19 (×2): qty 1
  Filled 2015-02-19: qty 3

## 2015-02-19 MED ORDER — PNEUMOCOCCAL VAC POLYVALENT 25 MCG/0.5ML IJ INJ
0.5000 mL | INJECTION | INTRAMUSCULAR | Status: AC
  Administered 2015-02-20: 0.5 mL via INTRAMUSCULAR
  Filled 2015-02-19: qty 0.5

## 2015-02-19 MED ORDER — ALBUTEROL SULFATE (2.5 MG/3ML) 0.083% IN NEBU: 2.5000 mg | INHALATION_SOLUTION | Freq: Four times a day (QID) | RESPIRATORY_TRACT | Status: DC | PRN

## 2015-02-19 MED ORDER — PANCRELIPASE (LIP-PROT-AMYL) 12000-38000 UNITS PO CPEP
36000.0000 [IU] | ORAL_CAPSULE | Freq: Two times a day (BID) | ORAL | Status: DC
  Administered 2015-02-20 – 2015-02-21 (×3): 36000 [IU] via ORAL
  Filled 2015-02-19 (×3): qty 3

## 2015-02-19 MED ORDER — KETOROLAC TROMETHAMINE 60 MG/2ML IM SOLN
60.0000 mg | Freq: Once | INTRAMUSCULAR | Status: AC
  Administered 2015-02-19: 60 mg via INTRAMUSCULAR

## 2015-02-19 MED ORDER — DULOXETINE HCL 60 MG PO CPEP
120.0000 mg | ORAL_CAPSULE | Freq: Every day | ORAL | Status: DC
  Administered 2015-02-19 – 2015-02-21 (×3): 120 mg via ORAL
  Filled 2015-02-19: qty 4
  Filled 2015-02-19: qty 2
  Filled 2015-02-19: qty 4

## 2015-02-19 MED ORDER — OXYCODONE HCL ER 15 MG PO T12A
15.0000 mg | EXTENDED_RELEASE_TABLET | Freq: Two times a day (BID) | ORAL | Status: DC
  Administered 2015-02-19 – 2015-02-21 (×4): 15 mg via ORAL
  Filled 2015-02-19 (×4): qty 1

## 2015-02-19 MED ORDER — INSULIN ASPART 100 UNIT/ML ~~LOC~~ SOLN
0.0000 [IU] | Freq: Three times a day (TID) | SUBCUTANEOUS | Status: DC
  Administered 2015-02-20 – 2015-02-21 (×3): 1 [IU] via SUBCUTANEOUS
  Filled 2015-02-19: qty 1

## 2015-02-19 MED ORDER — HYDROMORPHONE HCL 1 MG/ML IJ SOLN
0.5000 mg | INTRAMUSCULAR | Status: DC | PRN
  Administered 2015-02-19 – 2015-02-20 (×3): 0.5 mg via INTRAVENOUS
  Filled 2015-02-19 (×3): qty 1

## 2015-02-19 MED ORDER — ACETAMINOPHEN 325 MG PO TABS: 650.0000 mg | ORAL_TABLET | Freq: Four times a day (QID) | ORAL | Status: DC | PRN

## 2015-02-19 MED ORDER — INSULIN ASPART 100 UNIT/ML ~~LOC~~ SOLN
0.0000 [IU] | Freq: Every day | SUBCUTANEOUS | Status: DC
  Filled 2015-02-19: qty 3
  Filled 2015-02-19: qty 1

## 2015-02-19 MED ORDER — PANTOPRAZOLE SODIUM 40 MG PO TBEC
40.0000 mg | DELAYED_RELEASE_TABLET | Freq: Every day | ORAL | Status: DC
  Administered 2015-02-19 – 2015-02-21 (×3): 40 mg via ORAL
  Filled 2015-02-19 (×3): qty 1

## 2015-02-19 MED ORDER — MUPIROCIN 2 % EX OINT
1.0000 | TOPICAL_OINTMENT | Freq: Two times a day (BID) | CUTANEOUS | Status: DC
Start: 2015-02-19 — End: 2015-02-21
  Administered 2015-02-19 – 2015-02-21 (×3): 1 via NASAL
  Filled 2015-02-19 (×2): qty 22

## 2015-02-19 MED ORDER — OXYCODONE HCL 5 MG PO TABS
5.0000 mg | ORAL_TABLET | Freq: Three times a day (TID) | ORAL | Status: DC | PRN
  Administered 2015-02-19 – 2015-02-20 (×3): 5 mg via ORAL
  Filled 2015-02-19 (×3): qty 1

## 2015-02-19 MED ORDER — DIAZEPAM 2 MG PO TABS
2.0000 mg | ORAL_TABLET | Freq: Once | ORAL | Status: AC
  Administered 2015-02-19: 2 mg via ORAL

## 2015-02-19 MED ORDER — POLYETHYLENE GLYCOL 3350 17 G PO PACK
17.0000 g | PACK | Freq: Every day | ORAL | Status: DC
  Administered 2015-02-19 – 2015-02-21 (×3): 17 g via ORAL
  Filled 2015-02-19 (×2): qty 1

## 2015-02-19 MED ORDER — ALBUTEROL SULFATE HFA 108 (90 BASE) MCG/ACT IN AERS: 2.0000 | INHALATION_SPRAY | Freq: Four times a day (QID) | RESPIRATORY_TRACT | Status: DC | PRN

## 2015-02-19 MED ORDER — PANCRELIPASE (LIP-PROT-AMYL) 36000-114000 UNITS PO CPEP: 36000.0000 [IU] | ORAL_CAPSULE | ORAL | Status: DC

## 2015-02-19 MED ORDER — CHLORHEXIDINE GLUCONATE CLOTH 2 % EX PADS
6.0000 | MEDICATED_PAD | Freq: Every day | CUTANEOUS | Status: DC
Start: 2015-02-20 — End: 2015-02-21

## 2015-02-19 MED ORDER — HEPARIN SODIUM (PORCINE) 5000 UNIT/ML IJ SOLN
5000.0000 [IU] | Freq: Three times a day (TID) | INTRAMUSCULAR | Status: DC
  Administered 2015-02-19 – 2015-02-21 (×5): 5000 [IU] via SUBCUTANEOUS
  Filled 2015-02-19 (×5): qty 1

## 2015-02-19 MED ORDER — DIAZEPAM 2 MG PO TABS
ORAL_TABLET | ORAL | Status: AC
  Administered 2015-02-19: 2 mg via ORAL
  Filled 2015-02-19: qty 1

## 2015-02-19 MED ORDER — SODIUM CHLORIDE 0.9 % IV SOLN
INTRAVENOUS | Status: DC
  Administered 2015-02-19 – 2015-02-20 (×3): via INTRAVENOUS

## 2015-02-19 NOTE — ED Provider Notes (Signed)
[facility] Emergency Department Provider Note  ____________________________________________  Time seen: Approximately 11:58 AM  I have reviewed the triage vital signs and the nursing notes.   HISTORY  Chief Complaint Back Pain  HPI Traci Davis is a 44 y.o. female point in today by EMS for back pain. Patient states she was seen at [facility] this week and believes that while walking with her walker that this caused her back to begin to hurt. Last evening she took her usual dose of oxycodone with minimal relief. Today she did not take any medication as she decided to call EMS instead. She denies any fall or injury to her back. She has had a history of back problems in the past. Patient has cerebral palsy and generally walks with her walker.She states she believes she "twisted wrong" and because her knees are weak that her back now hurts. She denies any urinary symptoms or history of kidney stones. She denies any fever, chills, nausea or vomiting. Pain is low back and nonradiating. Currently she rates her pain as 10 out of 10. Movement increases her pain while lying still decreases. She continues to take her regular medication as prescribed by her doctor at [facility].   Past Medical History  Diagnosis Date  . Anxiety   . COPD (chronic obstructive pulmonary disease) ([place])   . Asthma   . Arthritis   . Depression   . Diabetes mellitus without complication ([place])   . Emphysema of lung ([place])   . GERD (gastroesophageal reflux disease)   . Hypertension     There are no active problems to display for this patient.   Past Surgical History  Procedure Laterality Date  . Joint replacement Right     both knees partial replacements dates unknown  . Cesarean section  1995    Current Outpatient Rx  Name  Route  Sig  Dispense  Refill  . ARIPiprazole (ABILIFY) 5 MG tablet   Oral   Take 5 mg by mouth daily.         . DULoxetine (CYMBALTA) 60 MG capsule    Oral   Take 60 mg by mouth 2 (two) times daily.         Marland Kitchen etodolac (LODINE) 400 MG tablet   Oral   Take 400 mg by mouth 2 (two) times daily.         Marland Kitchen gabapentin (NEURONTIN) 300 MG capsule   Oral   Take 300 mg by mouth 3 (three) times daily.         Marland Kitchen glimepiride (AMARYL) 4 MG tablet   Oral   Take 4 mg by mouth daily with breakfast.         . hydrOXYzine (VISTARIL) 25 MG capsule   Oral   Take 25 mg by mouth 2 (two) times daily as needed.         Marland Kitchen lisinopril (PRINIVIL,ZESTRIL) 20 MG tablet   Oral   Take 20 mg by mouth daily.         Marland Kitchen lubiprostone (AMITIZA) 24 MCG capsule   Oral   Take 24 mcg by mouth 2 (two) times daily with a meal.         . metFORMIN (GLUCOPHAGE) 500 MG tablet   Oral   Take by mouth 2 (two) times daily with a meal.         . omeprazole (PRILOSEC) 40 MG capsule   Oral   Take 40 mg by mouth 2 (two)  times daily.         Marland Kitchen oxyCODONE (OXY IR/ROXICODONE) 5 MG immediate release tablet   Oral   Take 5 mg by mouth 3 (three) times daily.         . promethazine (PHENERGAN) 25 MG tablet   Oral   Take 25 mg by mouth every 6 (six) hours as needed for nausea or vomiting.         . ramelteon (ROZEREM) 8 MG tablet   Oral   Take 8 mg by mouth at bedtime.         . solifenacin (VESICARE) 5 MG tablet   Oral   Take 5 mg by mouth daily.         Marland Kitchen tiZANidine (ZANAFLEX) 4 MG tablet   Oral   Take 4 mg by mouth every 8 (eight) hours as needed for muscle spasms.           Allergies Baclofen and Morphine and related  History reviewed. No pertinent family history.  Social History Social History  Substance Use Topics  . Smoking status: Never Smoker   . Smokeless tobacco: None  . Alcohol Use: None    Review of Systems Constitutional: No fever/chills Cardiovascular: Denies chest pain. Respiratory: Denies shortness of breath. Gastrointestinal: No abdominal pain.  No nausea, no vomiting.  No diarrhea.  No  constipation. Genitourinary: Negative for dysuria. Musculoskeletal: Positive for back pain. Skin: Negative for rash. Neurological: Negative for headaches, focal weakness or numbness. Complains of bilateral knee weakness.  10-point ROS otherwise negative.  ____________________________________________   PHYSICAL EXAM:  VITAL SIGNS: ED Triage Vitals  Enc Vitals Group     BP 02/19/15 1142 93/46 mmHg     Pulse Rate 02/19/15 1142 106     Resp 02/19/15 1142 12     Temp 02/19/15 1142 98.5 F (36.9 C)     Temp Source 02/19/15 1142 Oral     SpO2 02/19/15 1142 92 %     Weight 02/19/15 1142 234 lb (106.142 kg)     Height 02/19/15 1142 5\' 2"  (1.575 m)     Head Cir --      Peak Flow --      Pain Score 02/19/15 1144 10     Pain Loc --      Pain Edu? --      Excl. in [place]? --     Constitutional: Alert and oriented. Well appearing and in no acute distress. Eyes: Conjunctivae are normal. PERRL. EOMI. Head: Atraumatic. Nose: No congestion/rhinnorhea. Mouth/Throat: Mucous membranes are moist.  Oropharynx non-erythematous. Neck: No stridor.  Supple. Range of motion unrestricted. Cardiovascular: Normal rate, regular rhythm. Grossly normal heart sounds.  Good peripheral circulation. Respiratory: Normal respiratory effort.  No retractions. Lungs CTAB. Gastrointestinal: Soft and nontender. No distention. Bowel sounds normoactive 4 quadrants. Musculoskeletal: On palpation of the back there is bilateral lower back pain with paravertebral muscle involvement. Range of motion is restricted secondary to discomfort. No active muscle spasms were seen. Neurologic:  Normal speech and language. No gross focal neurologic deficits are appreciated. No gait instability. Skin:  Skin is warm, dry and intact. No rash noted. Psychiatric: Mood and affect are normal. Speech and behavior are normal.  ____________________________________________   LABS (all labs ordered are listed, but only abnormal results are  displayed)  Labs Reviewed  URINALYSIS COMPLETEWITH MICROSCOPIC ([facility] ONLY) - Abnormal; Notable for the following:    Color, Urine AMBER (*)    APPearance CLOUDY (*)    Bilirubin  Urine 2+ (*)    Protein, ur 100 (*)    Bacteria, UA RARE (*)    Squamous Epithelial / LPF 0-5 (*)    All other components within normal limits  COMPREHENSIVE METABOLIC PANEL - Abnormal; Notable for the following:    Chloride 98 (*)    Glucose, Bld 102 (*)    BUN 26 (*)    Creatinine, Ser 3.33 (*)    Calcium 8.8 (*)    Total Protein 6.4 (*)    Albumin 3.3 (*)    AST 72 (*)    ALT 100 (*)    GFR calc non Af Amer 16 (*)    GFR calc Af Amer 18 (*)    All other components within normal limits  CBC WITH DIFFERENTIAL/PLATELET - Abnormal; Notable for the following:    WBC 11.9 (*)    Hemoglobin 10.5 (*)    HCT 33.3 (*)    MCV 73.1 (*)    MCH 23.1 (*)    MCHC 31.6 (*)    RDW 21.1 (*)    Neutro Abs 8.3 (*)    All other components within normal limits  LIPASE, BLOOD    PROCEDURES  Procedure(s) performed: None  Critical Care performed: No  ____________________________________________   INITIAL IMPRESSION / ASSESSMENT AND PLAN / ED COURSE  Pertinent labs & imaging results that were available during my care of the patient were reviewed by me and considered in my medical decision making (see chart for details).  Urinalysis was obtained to rule out a UTI with patient's back pain. Urine showed 2+ bilirubin which was a new finding in comparison to urinalysis done at [facility] in the past. Today's BUN/creatinine were also elevated and in comparison to her last blood work done at [facility] this is a marked change from 12/30/2014. Patient is to be evaluated for admission and further workup at [facility]. ____________________________________________   FINAL CLINICAL IMPRESSION(S) / ED DIAGNOSES  Final diagnoses:  Acute renal failure, unspecified acute renal failure type ([place])  Bilateral low back pain without  sciatica  Bilirubin in urine      Johnn Hai, PA-C 02/19/15 1559  Delman Kitten, MD 02/20/15 2254

## 2015-02-19 NOTE — ED Notes (Signed)
Patient transported to Ultrasound 

## 2015-02-19 NOTE — H&P (Signed)
[place] at [place] NAME: Traci Davis    MR#:  [id number]  DATE OF BIRTH:  01-08-1971  DATE OF ADMISSION:  02/19/2015  PRIMARY CARE PHYSICIAN: Vista Mink, FNP   REQUESTING/REFERRING PHYSICIAN: Dr. Jacqualine Code  CHIEF COMPLAINT:   Chief Complaint  Patient presents with  . Back Pain    HISTORY OF PRESENT ILLNESS:  Traci Davis  is a 44 y.o. female with a known history of cerebral palsy causing back pain, hypertension, GERD, depression presents via EMS due to back pain. She states that for the past 2 days she has had increasing pain in the lumbar area with shooting pain down both legs. She takes chronic opiates at home and they have not been working to control her pain. Of note she was on a two-month course of prednisone from her ophthalmologist due to "a twitching tremor in her eyeball" and she has been on meloxicam for 2 weeks due to generalized muscle spasms. She has had no fevers or chills. She has not had any dysuria or respiratory symptoms no nausea vomiting and diarrhea. She does state that she has right sided abdominal pain for the past day or 2. On emergency room evaluation she is found to be in acute renal failure with significant hypotension and is being admitted for this. Her last creatinine here on 12/30/2014 at [facility] was 0.86 with a GFR greater than 60 today her creatinine is 3.3 with GFR of 16.  PAST MEDICAL HISTORY:   Past Medical History  Diagnosis Date  . Anxiety   . Asthma   . Arthritis   . Depression   . Diabetes mellitus without complication ([place])   . GERD (gastroesophageal reflux disease)   . Hypertension   . COPD (chronic obstructive pulmonary disease) ([place])     PAST SURGICAL HISTORY:   Past Surgical History  Procedure Laterality Date  . Joint replacement Right     both knees partial replacements dates unknown  . Cesarean section  1995  . Cholecystectomy      SOCIAL HISTORY:   Social History   Substance Use Topics  . Smoking status: Never Smoker   . Smokeless tobacco: Not on file  . Alcohol Use: No   Uses a walker for mobility. At home uses a wheelchair. Lives with her daughter and her boyfriend.  FAMILY HISTORY:   Family History  Problem Relation Age of Onset  . CAD Father     DRUG ALLERGIES:   Allergies  Allergen Reactions  . Cephalexin Hives  . Meloxicam Other (See Comments)    Damage kidney  . Baclofen Other (See Comments)    "makes cerebral palsy do adverse reaction on me"  . Morphine And Related Other (See Comments)    hallucinations  . Vantin  [Cefpodoxime]     Other reaction(s): Vomiting  . Ciprofloxacin Itching and Nausea And Vomiting  . Tape Rash    Other reaction(s): Other (See Comments) Uncoded Allergy. Allergen: Tape, Other Reaction: skin tears    REVIEW OF SYSTEMS:   Review of Systems  Constitutional: Negative for fever, chills, weight loss and malaise/fatigue.  HENT: Negative for congestion, hearing loss and sore throat.   Eyes: Negative for blurred vision and pain.  Respiratory: Negative for cough, hemoptysis, sputum production, shortness of breath and stridor.   Cardiovascular: Negative for chest pain, palpitations, orthopnea and leg swelling.  Gastrointestinal: Positive for abdominal pain. Negative for nausea, vomiting, diarrhea, constipation and blood in stool.  Genitourinary:  Negative for dysuria and frequency.  Musculoskeletal: Positive for myalgias and back pain. Negative for joint pain and neck pain.  Skin: Negative for rash.  Neurological: Negative for focal weakness, loss of consciousness and headaches.  Endo/Heme/Allergies: Does not bruise/bleed easily.  Psychiatric/Behavioral: Positive for depression. Negative for hallucinations. The patient is not nervous/anxious.     MEDICATIONS AT HOME:   Prior to Admission medications   Medication Sig Start Date End Date Taking? Authorizing Provider  ARIPiprazole (ABILIFY) 5 MG tablet  Take 5 mg by mouth daily.    Historical Provider, MD  DULoxetine (CYMBALTA) 60 MG capsule Take 60 mg by mouth 2 (two) times daily.    Historical Provider, MD  etodolac (LODINE) 400 MG tablet Take 400 mg by mouth 2 (two) times daily.    Historical Provider, MD  gabapentin (NEURONTIN) 300 MG capsule Take 300 mg by mouth 3 (three) times daily.    Historical Provider, MD  glimepiride (AMARYL) 4 MG tablet Take 4 mg by mouth daily with breakfast.    Historical Provider, MD  hydrOXYzine (VISTARIL) 25 MG capsule Take 25 mg by mouth 2 (two) times daily as needed.    Historical Provider, MD  lisinopril (PRINIVIL,ZESTRIL) 20 MG tablet Take 20 mg by mouth daily.    Historical Provider, MD  lubiprostone (AMITIZA) 24 MCG capsule Take 24 mcg by mouth 2 (two) times daily with a meal.    Historical Provider, MD  metFORMIN (GLUCOPHAGE) 500 MG tablet Take by mouth 2 (two) times daily with a meal.    Historical Provider, MD  omeprazole (PRILOSEC) 40 MG capsule Take 40 mg by mouth 2 (two) times daily.    Historical Provider, MD  oxyCODONE (OXY IR/ROXICODONE) 5 MG immediate release tablet Take 5 mg by mouth 3 (three) times daily.    Historical Provider, MD  promethazine (PHENERGAN) 25 MG tablet Take 25 mg by mouth every 6 (six) hours as needed for nausea or vomiting.    Historical Provider, MD  ramelteon (ROZEREM) 8 MG tablet Take 8 mg by mouth at bedtime.    Historical Provider, MD  solifenacin (VESICARE) 5 MG tablet Take 5 mg by mouth daily.    Historical Provider, MD  tiZANidine (ZANAFLEX) 4 MG tablet Take 4 mg by mouth every 8 (eight) hours as needed for muscle spasms.    Historical Provider, MD      VITAL SIGNS:  Blood pressure 79/44, pulse 95, temperature 97.9 F (36.6 C), temperature source Oral, resp. rate 16, height 5\' 2"  (1.575 m), weight 106.142 kg (234 lb), last menstrual period 08/20/2014, SpO2 95 %.  PHYSICAL EXAMINATION:  GENERAL:  44 y.o.-year-old patient lying in the bed with no acute distress.  Obese EYES: Pupils equal, round, reactive to light and accommodation. No scleral icterus. Extraocular muscles intact.  HEENT: Head atraumatic, normocephalic. Oropharynx and nasopharynx clear. Oral mucous membranes are dry. Good dentition NECK:  Supple, no jugular venous distention. No thyroid enlargement, no tenderness.  LUNGS: Normal breath sounds bilaterally, no wheezing, rales, rhonchi or crepitation. No use of accessory muscles of respiration.  CARDIOVASCULAR: S1, S2 normal. No murmurs, rubs, or gallops.  ABDOMEN: Soft, minor tenderness to palpation in the right upper and lower quadrants, slightly distended. Bowel sounds present. No organomegaly or mass. No guarding or rebound EXTREMITIES: No pedal edema, cyanosis, or clubbing. Peripheral pulses are weak NEUROLOGIC: Cranial nerves II through XII are intact. Muscle strength 5/5 in all extremities. Sensation intact. Gait not checked.  PSYCHIATRIC: The patient is alert and oriented  x 3.  SKIN: No obvious rash, lesion, or ulcer.   LABORATORY PANEL:   CBC  Recent Labs Lab 02/19/15 1438  WBC 11.9*  HGB 10.5*  HCT 33.3*  PLT 332   ------------------------------------------------------------------------------------------------------------------  Chemistries   Recent Labs Lab 02/19/15 1438  NA 138  K 3.7  CL 98*  CO2 29  GLUCOSE 102*  BUN 26*  CREATININE 3.33*  CALCIUM 8.8*  AST 72*  ALT 100*  ALKPHOS 100  BILITOT 0.7   ------------------------------------------------------------------------------------------------------------------  Cardiac Enzymes No results for input(s): TROPONINI in the last 168 hours. ------------------------------------------------------------------------------------------------------------------  RADIOLOGY:  No results found.  EKG:   Orders placed or performed in visit on 04/15/14  . EKG 12-Lead    IMPRESSION AND PLAN:   #1 acute renal failure: Likely due to multiple medications  including prednisone, lisinopril, meloxicam, etodolac, metformin as well as hypotension. She reports that she's been eating and drinking normally has not had decreased urine output. She states that she has had significant diaphoresis for the past at least 1 week. We'll continue with IV fluids. Monitor ins and outs. Obtain a renal ultrasound. Recheck renal function in the morning. Hold nephrotoxic agents. Monitor blood pressure carefully. Electrolytes are stable. Consult nephrology in am if renal function not improved.  #2 hypertension: At this point she is hypotensive. We'll hold and antihypertensives. Provide IV fluids. She does not appear septic. No chest pain. Hypotension may be due to medication effect. Fall risk due to hypotension.  #3 chronic back pain: We'll need to proceed carefully due to hypotension. She does take chronic opiates which I will hold until blood pressure improves  #4 abdominal pain: She has mild tenderness to palpation of the right side of her abdomen. She is slightly distended. Will obtain a KUB. She states that bowel movements have been normal. She is not nauseated, states that she would like a meal.  #5 Diabetes mellitus type 2: Check hemoglobin A1c. Start sliding scale  #6 Transaminitis: Likely due to hypotension possibly due to fatty liver disease. She has had a recent abdominal ultrasound in September 2016 by [facility] which shows steatosis. Recheck in the morning.   CODE STATUS: Full   TOTAL TIME TAKING CARE OF THIS PATIENT: 45 minutes.  Greater than 50% of time spent in coordination of care and counseling. Care plan discussed with the patient and emergency room physician. Also discussed care plan with the patient's daughter on the phone.  Myrtis Ser M.D on 02/19/2015 at 5:19 PM  Between 7am to 6pm - Pager - [phone]  After 6pm go to [email or web address] - password [email or web address] [email or web address]  Office  [phone]  CC: Primary care physician;  Vista Mink, FNP

## 2015-02-19 NOTE — ED Notes (Signed)
Patient transported to ultrasound. Bolus placed on IV pump to finish going in.

## 2015-02-19 NOTE — Progress Notes (Signed)
Patient arrived to unit from [facility] via stretcher and was transferred to our bed. She is alert, oriented, and very tearful at this time. She stated that her mother called and told her she needs to be in a nursing home and that had gotten her upset. Blood pressure on admission is within normal limits to our unit and charted. Patient wiped down with CHG wipes and pink foam added to sacrum. Elink notified of new admission. Report given to [facility] on night shift and patient now on the phone talking with family.

## 2015-02-20 DIAGNOSIS — N179 Acute kidney failure, unspecified: Secondary | ICD-10-CM | POA: Diagnosis not present

## 2015-02-20 LAB — COMPREHENSIVE METABOLIC PANEL
ALT: 91 U/L — ABNORMAL HIGH (ref 14–54)
AST: 98 U/L — AB (ref 15–41)
Albumin: 2.8 g/dL — ABNORMAL LOW (ref 3.5–5.0)
Alkaline Phosphatase: 91 U/L (ref 38–126)
Anion gap: 7 (ref 5–15)
BILIRUBIN TOTAL: 0.4 mg/dL (ref 0.3–1.2)
BUN: 15 mg/dL (ref 6–20)
CO2: 28 mmol/L (ref 22–32)
Calcium: 8 mg/dL — ABNORMAL LOW (ref 8.9–10.3)
Chloride: 105 mmol/L (ref 101–111)
Creatinine, Ser: 1.42 mg/dL — ABNORMAL HIGH (ref 0.44–1.00)
GFR, EST AFRICAN AMERICAN: 51 mL/min — AB (ref >60)
GFR, EST NON AFRICAN AMERICAN: 44 mL/min — AB (ref >60)
Glucose, Bld: 146 mg/dL — ABNORMAL HIGH (ref 65–99)
POTASSIUM: 4 mmol/L (ref 3.5–5.1)
Sodium: 140 mmol/L (ref 135–145)
TOTAL PROTEIN: 5.9 g/dL — AB (ref 6.5–8.1)

## 2015-02-20 LAB — CBC
HEMATOCRIT: 29.7 % — AB (ref 35.0–47.0)
Hemoglobin: 9.5 g/dL — ABNORMAL LOW (ref 12.0–16.0)
MCH: 23.2 pg — AB (ref 26.0–34.0)
MCHC: 32 g/dL (ref 32.0–36.0)
MCV: 72.5 fL — AB (ref 80.0–100.0)
PLATELETS: 254 10*3/uL (ref 150–440)
RBC: 4.1 MIL/uL (ref 3.80–5.20)
RDW: 21.6 % — AB (ref 11.5–14.5)
WBC: 8 10*3/uL (ref 3.6–11.0)

## 2015-02-20 LAB — GLUCOSE, CAPILLARY
GLUCOSE-CAPILLARY: 138 mg/dL — AB (ref 65–99)
GLUCOSE-CAPILLARY: 126 mg/dL — AB (ref 65–99)
GLUCOSE-CAPILLARY: 104 mg/dL — AB (ref 65–99)
Glucose-Capillary: 112 mg/dL — ABNORMAL HIGH (ref 65–99)

## 2015-02-20 LAB — HEMOGLOBIN A1C: Hgb A1c MFr Bld: 6.1 % — ABNORMAL HIGH (ref 4.0–6.0)

## 2015-02-20 MED ORDER — DARIFENACIN HYDROBROMIDE ER 7.5 MG PO TB24
7.5000 mg | ORAL_TABLET | Freq: Every day | ORAL | Status: DC
  Administered 2015-02-20 – 2015-02-21 (×2): 7.5 mg via ORAL
  Filled 2015-02-20 (×2): qty 1

## 2015-02-20 MED ORDER — SODIUM CHLORIDE 0.9 % IV SOLN
INTRAVENOUS | Status: DC
  Administered 2015-02-20 – 2015-02-21 (×4): via INTRAVENOUS

## 2015-02-20 MED ORDER — GLIMEPIRIDE 2 MG PO TABS
4.0000 mg | ORAL_TABLET | Freq: Every day | ORAL | Status: DC
  Administered 2015-02-21: 08:00:00 4 mg via ORAL
  Filled 2015-02-20: qty 2

## 2015-02-20 MED ORDER — ALBUTEROL SULFATE (2.5 MG/3ML) 0.083% IN NEBU: 3.0000 mL | INHALATION_SOLUTION | Freq: Four times a day (QID) | RESPIRATORY_TRACT | Status: DC | PRN

## 2015-02-20 MED ORDER — SODIUM CHLORIDE 0.9 % IJ SOLN
3.0000 mL | Freq: Two times a day (BID) | INTRAMUSCULAR | Status: DC
  Administered 2015-02-21 (×2): 3 mL via INTRAVENOUS

## 2015-02-20 MED ORDER — TIZANIDINE HCL 4 MG PO TABS
4.0000 mg | ORAL_TABLET | Freq: Three times a day (TID) | ORAL | Status: DC | PRN
  Administered 2015-02-20 – 2015-02-21 (×3): 4 mg via ORAL
  Filled 2015-02-20 (×3): qty 1

## 2015-02-20 MED ORDER — LUBIPROSTONE 24 MCG PO CAPS
24.0000 ug | ORAL_CAPSULE | Freq: Two times a day (BID) | ORAL | Status: DC | PRN
  Filled 2015-02-20: qty 1

## 2015-02-20 MED ORDER — RAMELTEON 8 MG PO TABS
8.0000 mg | ORAL_TABLET | Freq: Every day | ORAL | Status: DC
  Administered 2015-02-21: 8 mg via ORAL
  Filled 2015-02-20 (×3): qty 1

## 2015-02-20 MED ORDER — PROMETHAZINE HCL 25 MG PO TABS: 25.0000 mg | ORAL_TABLET | Freq: Three times a day (TID) | ORAL | Status: DC | PRN

## 2015-02-20 MED ORDER — TIZANIDINE HCL 4 MG PO TABS: 4.0000 mg | ORAL_TABLET | Freq: Three times a day (TID) | ORAL | Status: DC

## 2015-02-20 NOTE — Progress Notes (Signed)
Report called to Geni Bers RN and patient is being transferred to room 124.

## 2015-02-20 NOTE — Progress Notes (Signed)
RN entered room to administer medication. Pt sound asleep holding sandwich in her hand/dinner tray in her lap. Assisted pt with repositioning/arouses readily with stimulation, but returns to sleep when quiet. Will continued to hold sedating medications at this time.

## 2015-02-20 NOTE — Plan of Care (Signed)
Problem: Discharge Progression Outcomes Goal: Discharge plan in place and appropriate Outcome: Progressing Individualization: 1. Lives at home with dgt and boyfriend. 2. Pt reported to CCU nurse that she has been taking double doses of prescribed pain medications- large quanatity of meds stored in pharmacy. 3. HIGH fall risk: bed alarm, protective footwear, offer toileting with hourly rounds, keep personal items close at hand, fall- don't fall education done. 4.PMH; cerebral palsy, chronic back pain, HTN, GERD, depression, DM, asthma, anxiety-controlled by home meds.  Goal: Other Discharge Outcomes/Goals Outcome: Progressing 1. Pt plans to return home with support of family. Care management and social work consults have been submitted. 2. Pt asks for pain med constantly; sedated currently- falling to sleep during assessment and phone calls. Pt advised for her safety we would reassess for safety of pain med administration. 3. VSS.  4. Creatinine improving to 1.42, HGB 9,5 Gm. 5. Dropping off to sleep before she can eat. Tray at bedside. IVF"s continued. 6. Bedrest at this time.  Transferred from CCU. **Pt's meds stored in pharmacy and needs to be returned to pt upon discharge.**

## 2015-02-20 NOTE — Progress Notes (Signed)
Patient ID: Traci Davis, female   DOB: [birth year], 44 y.o.   MRN: [id number] [facility] [place] NAME: Traci Davis    MR#:  [id number]  DATE OF BIRTH:  Oct 21, 1970  SUBJECTIVE:   Came in with LBP and weakness. Found to be in renal fialure Feels some better today  REVIEW OF SYSTEMS:   Review of Systems  Constitutional: Negative for fever, chills and weight loss.  HENT: Negative for ear discharge, ear pain and nosebleeds.   Eyes: Negative for blurred vision, pain and discharge.  Respiratory: Negative for sputum production, shortness of breath, wheezing and stridor.   Cardiovascular: Negative for chest pain, palpitations, orthopnea and PND.  Gastrointestinal: Negative for nausea, vomiting, abdominal pain and diarrhea.  Genitourinary: Negative for urgency and frequency.  Musculoskeletal: Negative for back pain and joint pain.  Neurological: Positive for weakness. Negative for sensory change, speech change and focal weakness.  Psychiatric/Behavioral: Negative for depression and hallucinations. The patient is not nervous/anxious.   All other systems reviewed and are negative.  Tolerating Diet:yesTolerating PT: pending  DRUG ALLERGIES:   Allergies  Allergen Reactions  . Cephalexin Hives  . Meloxicam Other (See Comments)    Damage kidney  . Baclofen Other (See Comments)    "makes cerebral palsy do adverse reaction on me"  . Morphine And Related Other (See Comments)    hallucinations  . Vantin  [Cefpodoxime]     Other reaction(s): Vomiting  . Ciprofloxacin Itching and Nausea And Vomiting  . Tape Rash    Other reaction(s): Other (See Comments) Uncoded Allergy. Allergen: Tape, Other Reaction: skin tears    VITALS:  Blood pressure 144/71, pulse 101, temperature 98.5 F (36.9 C), temperature source Oral, resp. rate 24, height 5\' 2"  (1.575 m), weight 105.4 kg (232 lb 5.8 oz), last menstrual period 08/20/2014, SpO2 98  %.  PHYSICAL EXAMINATION:   Physical Exam  GENERAL:  44 y.o.-year-old patient lying in the bed with no acute distress. obese EYES: Pupils equal, round, reactive to light and accommodation. No scleral icterus. Extraocular muscles intact.  HEENT: Head atraumatic, normocephalic. Oropharynx and nasopharynx clear.  NECK:  Supple, no jugular venous distention. No thyroid enlargement, no tenderness.  LUNGS: Normal breath sounds bilaterally, no wheezing, rales, rhonchi. No use of accessory muscles of respiration.  CARDIOVASCULAR: S1, S2 normal. No murmurs, rubs, or gallops.  ABDOMEN: Soft, nontender, nondistended. Bowel sounds present. No organomegaly or mass.  EXTREMITIES: No cyanosis, clubbing or edema b/l.    NEUROLOGIC: Cranial nerves II through XII are intact. No focal Motor or sensory deficits b/l.   PSYCHIATRIC: The patient is alert and oriented x 3.  SKIN: No obvious rash, lesion, or ulcer.    LABORATORY PANEL:   CBC  Recent Labs Lab 02/20/15 0656  WBC 8.0  HGB 9.5*  HCT 29.7*  PLT 254    Chemistries   Recent Labs Lab 02/20/15 0656  NA 140  K 4.0  CL 105  CO2 28  GLUCOSE 146*  BUN 15  CREATININE 1.42*  CALCIUM 8.0*  AST 98*  ALT 91*  ALKPHOS 91  BILITOT 0.4    Cardiac Enzymes No results for input(s): TROPONINI in the last 168 hours.  RADIOLOGY:  Dg Chest 2 View  02/19/2015  CLINICAL DATA:  Hypotension. EXAM: CHEST  2 VIEW COMPARISON:  Radiographs 01/14/2014 FINDINGS: The cardiomediastinal contours are unchanged, heart at the upper limits normal in size. Pulmonary vasculature is normal. No consolidation, pleural effusion,  or pneumothorax. No acute osseous abnormalities are seen. IMPRESSION: No acute pulmonary process. Electronically Signed   By: Jeb Levering M.D.   On: 02/19/2015 18:12   Abd 1 View (kub)  02/19/2015  CLINICAL DATA:  Abdominal and low back pain for 2 days. Hypotension. EXAM: ABDOMEN - 1 VIEW COMPARISON:  CT 04/15/2014 FINDINGS: The bowel  gas pattern is normal. Moderate stool burden in the right colon. No radio-opaque calculi. Surgical clips in the right upper quadrant of the abdomen from cholecystectomy. No acute osseous abnormalities are seen. IMPRESSION: Normal bowel gas pattern. Electronically Signed   By: Jeb Levering M.D.   On: 02/19/2015 18:11   US Renal  02/19/2015  CLINICAL DATA:  Acute renal failure EXAM: RENAL / URINARY TRACT ULTRASOUND COMPLETE COMPARISON:  CT abdomen pelvis dated 04/15/2014 FINDINGS: Right Kidney: Length: 8.9 cm.  No mass or hydronephrosis. Left Kidney: Length: 10.4 cm.  No mass or hydronephrosis. Bladder: Within normal limits. IMPRESSION: Small bilateral kidneys. Otherwise negative renal ultrasound. Electronically Signed   By: Julian Hy M.D.   On: 02/19/2015 17:25     ASSESSMENT AND PLAN:   #1 acute renal failure: Likely due to multiple medications including prednisone, lisinopril, meloxicam, etodolac, metformin as well as hypotension. She reports that she's been eating and drinking normally has not had decreased urine output.  - continue with IV fluids. Monitor ins and outs. Hold nephrotoxic agents. Monitor blood pressure carefully. Electrolytes are stable.  -creat 3.0--->1.42  #2 hypertension: At this point she is hypotensive. We'll hold and antihypertensives. Provide IV fluids. She does not appear septic. No chest pain. Hypotension may be due to medication effect. Fall risk due to hypotension.  #3 chronic back pain: We'll need to proceed carefully due to hypotension. She does take chronic opiates which will hold until blood pressure improves  #4 abdominal pain: She has mild tenderness to palpation of the right side of her abdomen. She is slightly distended.  She states that bowel movements have been normal. She is not nauseated, states that she would like a meal.  #5 Diabetes mellitus type 2:  Start sliding scale  #6 Transaminitis: Likely due to hypotension possibly due to fatty  liver disease. She has had a recent abdominal ultrasound in September 2016 by [facility] which shows steatosis. Recheck in the morning.  Transfer to med floor Case discussed with Care Management/Social Worker. Management plans discussed with the patient, family and they are in agreement.  CODE STATUS: full  DVT Prophylaxis:lovenox  TOTAL criticalTIME TAKING CARE OF THIS PATIENT: 35 minutes.  >50% time spent on counselling and coordination of care     Ruthvik Barnaby M.D on 02/20/2015 at 2:00 PM  Between 7am to 6pm - Pager - [phone]  After 6pm go to [email or web address] - password [email or web address] [facility]  Office  [phone]  CC: Primary care physician; Vista Mink, FNP

## 2015-02-20 NOTE — Progress Notes (Signed)
Patient still has her prescription bottles in her room. Explained to her that since her family had not picked them up we need to send them down to the pharmacy for safe keeping. She is currently in pain and stated that she takes 2 tablets of her immediate release oxycodone when it hurts this bad. Inventoried her medications with patient, Hydrographic surveyor the charge nurse, and myself. Her oxycodone instructions stated to take one tablet every 8 hours for pain which is what we also have ordered here and it is due again at 14:09. Patient also takes extended release oxycontin at home but that medication is not in her bag and she states that she did not bring that bottle with her to the hospital. Administered her zanaflex to see if that would help her. All medications placed in personal belongings bag, inventory sheets filled out and taking these down to pharmacy. Patient understands that her medications are being stored until her discharge for safety. Will continue to assess and monitor.

## 2015-02-20 NOTE — Plan of Care (Signed)
Problem: Discharge Progression Outcomes Goal: Other Discharge Outcomes/Goals Outcome: Progressing 1. Pt plans to return home with dgt and boyfriend. Case management and social work referrals submitted. 2. Pt asks for pain medications even when very sedated; unable to stay awake for conversations,telephone calls, feed herself. VSS. Will hold sedating meds until pt more alert.  3. Creatinine improving. VSS. 4. Assess condition frequently; identified concern of oversedation vs other cause of lethargy/drowsiness. 5. Tolerated diet well; had outside food.Fell asleep while feeding herself. 6. HIGH fall risk: bed alarm, yellow sock applied; call,don't fall education done, personal items at hand reach, offer toileting with hourly rounds. # PT's own meds are stored in the pharmacy.#

## 2015-02-20 NOTE — Clinical Social Work Note (Signed)
Clinical Social Work Assessment  Patient Details  Name: Traci Davis MRN: [id number] Date of Birth: 11/21/1970  Date of referral:  02/20/15               Reason for consult:  Abuse/Neglect, Medication Concerns                Permission sought to share information with:    Permission granted to share information::  No  Name::        Agency::     Relationship::     Contact Information:     Housing/Transportation Living arrangements for the past 2 months:  Single Family Home Source of Information:  Patient Patient Interpreter Needed:  None Criminal Activity/Legal Involvement Pertinent to Current Situation/Hospitalization:  No - Comment as needed Significant Relationships:  Adult Children, Spouse, Parents, Siblings Lives with:  Adult Children, Significant Other Do you feel safe going back to the place where you live?  Yes Need for family participation in patient care:  Yes (Comment)  Care giving concerns: Patient lives in [place] with her boyfriend and daughter.    Social Worker assessment / plan: Clinical Social Worker (CSW) received consult that patient may be over using her prescription drugs. CSW met with patient alone at bedside. Patient appeared to be very lethargic and fell asleep twice during assessment. Patient was oriented to self and place. Patient reported that it was "Oct. 20th 1916" then said "no that's wrong it's 2016." Patient reported that she lives in [place] with her boyfriend Larry Spivey and 21 yo daughter. Patient received 2 phone calls from her sister Bonnie during assessment. Patient fell asleep while talking on the phone. Patient reported that she does not work and receives $813 per month in disability. Patient reported that she takes a lot of mediation and that is why she is here in the hospital. Patient was complaining about the nurse not giving her pain medication. Patient reported that her back has been hurting and she has not had any pain medicine since 10 am.  CSW explained that RN and MD have to very careful to not overdose patient on pain medicine. Patient stated that she understood that.   CSW discussed case with RN. Per RN patient's mother expressed interest in patient being placed in a skilled nursing facility. CSW explained that patient would have to consent to SNF placement unless a MD determined that she does not have capacity to make decisions. Patient will also have to meet criteria for skilled nursing placement. CSW will continue to follow and assist as needed.   Employment status:  Disabled (Comment on whether or not currently receiving Disability) (Receives Disability ) Insurance information:  Medicare, Medicaid In State PT Recommendations:  Not assessed at this time Information / Referral to community resources:     Patient/Family's Response to care: Patient appeared to be lethargic throughout assessment.   Patient/Family's Understanding of and Emotional Response to Diagnosis, Current Treatment, and Prognosis: CSW will continue to follow patient's progress and assist as needed. Patient thanked CSW for visit.   Emotional Assessment Appearance:  Appears stated age Attitude/Demeanor/Rapport:  Complaining, Lethargic Affect (typically observed):    Orientation:  Fluctuating Orientation (Suspected and/or reported Sundowners), Oriented to Self, Oriented to Place Alcohol / Substance use:  Other (Possible perscription drug over use. ) Psych involvement (Current and /or in the community):  No (Comment)  Discharge Needs  Concerns to be addressed:  Denies Needs/Concerns at this time Readmission within the last 30 days:    No Current discharge risk:  Substance Abuse, Cognitively Impaired Barriers to Discharge:  Continued Medical Work up   Morgan,  G, LCSW 02/20/2015, 4:07 PM  

## 2015-02-21 ENCOUNTER — Ambulatory Visit: Payer: Medicare Other | Admitting: Physical Therapy

## 2015-02-21 ENCOUNTER — Inpatient Hospital Stay: Payer: Medicare Other

## 2015-02-21 LAB — GLUCOSE, CAPILLARY
GLUCOSE-CAPILLARY: 106 mg/dL — AB (ref 65–99)
GLUCOSE-CAPILLARY: 140 mg/dL — AB (ref 65–99)

## 2015-02-21 LAB — COMPREHENSIVE METABOLIC PANEL
ALK PHOS: 95 U/L (ref 38–126)
ALT: 88 U/L — AB (ref 14–54)
ANION GAP: 9 (ref 5–15)
AST: 134 U/L — ABNORMAL HIGH (ref 15–41)
Albumin: 3 g/dL — ABNORMAL LOW (ref 3.5–5.0)
BILIRUBIN TOTAL: 0.9 mg/dL (ref 0.3–1.2)
BUN: 10 mg/dL (ref 6–20)
CALCIUM: 8.3 mg/dL — AB (ref 8.9–10.3)
CO2: 25 mmol/L (ref 22–32)
Chloride: 105 mmol/L (ref 101–111)
Creatinine, Ser: 0.91 mg/dL (ref 0.44–1.00)
Glucose, Bld: 168 mg/dL — ABNORMAL HIGH (ref 65–99)
Potassium: 4.5 mmol/L (ref 3.5–5.1)
Sodium: 139 mmol/L (ref 135–145)
TOTAL PROTEIN: 6.4 g/dL — AB (ref 6.5–8.1)

## 2015-02-21 MED ORDER — ENOXAPARIN SODIUM 40 MG/0.4ML ~~LOC~~ SOLN: 40.0000 mg | SUBCUTANEOUS | Status: DC

## 2015-02-21 NOTE — Evaluation (Signed)
Physical Therapy Evaluation Patient Details Name: Traci Davis MRN: [id number] DOB: [birth year] Today's Date: 02/21/2015   History of Present Illness  presented to ER secondary to back pain; admitted with acute renal failure and hypotension.  Noted concern from medical team regarding possible overuse of home medications.  Clinical Impression  Upon evaluation, patient lethargic, but arousable/responsive to voice; intermittently closing eyes and dosing off throughout session.  Constant verbal stimulation to maintain alertness throughout session.  Demonstrates bilat UE/LE strength grossly WFL; LE strength generally weak (L LE 2+/5, R LE 3-/5) due to baseline CP, though patient reports LE strength loss since hospitalization.  Reports wearing L KAFO at baseline.  Currently, able to complete bed mobility with mod indep (use of bedrails and increased time); sit/stand, basic transfers with RW, min assist +2 for safety.  Very slow and effortful; significant use of UEs to assist with movement transitions and standing balance.  LE advancement very slow and shuffling; no significant buckling or LOB. Would benefit from skilled PT to address above deficits and promote optimal return to PLOF.  Patient adamantly refusing conversation regarding any potential for STR; recommend transition to HHPT upon discharge from acute hospitalization for home safety assessment and continued strength/mobility training to restore baseline mobility.     Follow Up Recommendations Home health PT (patient adamantly opposed to discussion regarding any potential for STR)    Equipment Recommendations       Recommendations for Other Services       Precautions / Restrictions Precautions Precautions: Fall Precaution Comments: contact isolation Restrictions Weight Bearing Restrictions: No      Mobility  Bed Mobility Overal bed mobility: Modified Independent             General bed mobility comments: heavy use of  bedrails for transition towards R  Transfers Overall transfer level: Needs assistance Equipment used: Rolling walker (2 wheeled) Transfers: Sit to/from [facility] Sit to Stand: Min assist;+2 physical assistance Stand pivot transfers: Min assist;+2 physical assistance       General transfer comment: +2 for safety.  Very heavy use of bilat UEs on RW to support body weight.  Very short, shuffling steps with limited foot clearance.  No overt buckling or LOB.  Very slow and labored in transfer.  Reports performance similar to baseline.  Ambulation/Gait             General Gait Details: not attempted; non-ambulatory at baseline  Stairs            Wheelchair Mobility    Modified Rankin (Stroke Patients Only)       Balance Overall balance assessment: Needs assistance Sitting-balance support: No upper extremity supported;Feet supported Sitting balance-Leahy Scale: Fair Sitting balance - Comments: close sup; limited ability to move/recover outside immediate BOS     Standing balance-Leahy Scale: Fair Standing balance comment: very heavy use of bilat UEs on RW; unsafe to attempt without                             Pertinent Vitals/Pain Pain Assessment: Faces Faces Pain Scale: Hurts a little bit Pain Location: back Pain Descriptors / Indicators: Grimacing Pain Intervention(s): Limited activity within patient's tolerance;Monitored during session;Repositioned (meds administered per RN prior to session)    Home Living Family/patient expects to be discharged to:: Private residence Living Arrangements: Children;Spouse/significant other Available Help at Discharge: Family Type of Home: House Home Access: Ramped entrance     Home Layout:  One level Home Equipment: [place] - 2 wheels;Wheelchair - power;Bedside commode      Prior Function Level of Independence: Needs assistance         Comments: Indep with bed mobility; assist from  boyfriend for basic transfers as needed.  Has aide 3x/week for assist with ADLs/bathing.  Power WC as primary mobility.  Denies fall history.  Reports participating with outpatient PT prior to admission (2x/week); typically wears L LE KAFO (not availble during this session)     Hand Dominance        Extremity/Trunk Assessment   Upper Extremity Assessment: Overall WFL for tasks assessed           Lower Extremity Assessment:  (bilat LEs with chronic weakness; L LE noted at 2+/5 (except L ankle 0/5), R LE 3-/5.  Denies sensory deficit at this time.)         Communication   Communication: No difficulties;Other (comment) (lethargic, intermittently dosing off throughout session)  Cognition Arousal/Alertness: Lethargic Behavior During Therapy: WFL for tasks assessed/performed Overall Cognitive Status: Within Functional Limits for tasks assessed                      General Comments      Exercises Other Exercises Other Exercises: Unsupported sitting balance to don pants, close sup; limited ability to actively extend LEs to assist with threading pants.  Sit/stand and standing balance with RW, min assist +2 for safety; heavy use of bilat UEs throughout.  Dep for pulling pants over hips      Assessment/Plan    PT Assessment Patient needs continued PT services  PT Diagnosis Difficulty walking;Generalized weakness   PT Problem List Decreased strength;Decreased range of motion;Decreased activity tolerance;Decreased balance;Decreased mobility;Decreased coordination;Decreased knowledge of use of DME;Decreased safety awareness;Decreased knowledge of precautions;Obesity  PT Treatment Interventions DME instruction;Gait training;Stair training;Functional mobility training;Therapeutic activities;Therapeutic exercise;Balance training;Cognitive remediation;Patient/family education   PT Goals (Current goals can be found in the Care Plan section) Acute Rehab PT Goals Patient Stated Goal:  "to go home" PT Goal Formulation: With patient Time For Goal Achievement: 03/07/15 Potential to Achieve Goals: Fair Additional Goals Additional Goal #1: Unsupported sitting balance, mod indep, for safety/indep with functional transfers and ADLs.    Frequency Min 2X/week   Barriers to discharge        Co-evaluation               End of Session Equipment Utilized During Treatment: Gait belt Activity Tolerance: Patient limited by fatigue;Patient limited by lethargy Patient left: in chair;with call bell/phone within reach;with chair alarm set (pad under patient, box/cords not available; clip alarm placed instead.  CNA/RN informed/aware of patient position, performance and alarm placement) Nurse Communication: Mobility status         Time: 0175-1025 PT Time Calculation (min) (ACUTE ONLY): 25 min   Charges:   PT Evaluation $Initial PT Evaluation Tier I: 1 Procedure PT Treatments $Therapeutic Activity: 8-22 mins   PT G Codes:        Kadyn Chovan H. Owens Shark, PT, DPT, NCS 02/21/2015, 2:27 PM [phone]

## 2015-02-21 NOTE — Plan of Care (Signed)
Problem: Discharge Progression Outcomes Goal: Other Discharge Outcomes/Goals Outcome: Progressing Plan of care progress to goals: Discharge plan: Pt is from home and would like to return home at discharge. Case management and SW consults ordered.  Pain controlled: pt c/o BL flank pain, scheduled pain medication given as ordered. Pt sleeping upon most entries to room, unable to stay awake for conversations, or telephone calls. VSS.   Hemodynamically stable: BUN and Creatinine improving, continue to monitor labs. IVF infusing per MD order. Tolerating diet: no c/o nausea or vomiting.  Activity appropriate for discharge: High fall risk, bed alarm activated. Pt instructed to call for assistance OOB.  # PT's own meds are stored in the pharmacy.#

## 2015-02-21 NOTE — Progress Notes (Signed)
MD making rounds. Discharge orders received. IV's removed. No Prescriptions. Hand delivered Home Medications to patient  that were retrieved from pharmacy. Discharge paperwork provided, explained, signed and witnessed. Education handouts provided to patient. No unanswered questions. Escorted via wheelchair by Nursing Staff. All belongings sent with patient.

## 2015-02-21 NOTE — Discharge Instructions (Signed)
Activity as tolerated, patient is wheelchair bound Diet diabetic Follow-up with primary care physician at [facility] in 3-5 days Follow-up with pain management in a week-

## 2015-02-21 NOTE — Progress Notes (Addendum)
Initial Nutrition Assessment   INTERVENTION:   Meals and Snacks: Cater to patient preference once able to advance diet order. Agree with Heart/Carb Modified diet order when able to advance as previously ordered.   NUTRITION DIAGNOSIS:   Inadequate oral intake related to inability to eat as evidenced by NPO status.  GOAL:   Patient will meet greater than or equal to 90% of their needs  MONITOR:    (Energy Intake, Glucose Profile, Anthropometrics, Electrolyte and renal Profile, glucose Profile)    REASON FOR ASSESSMENT:   Diagnosis    ASSESSMENT:   Pt admitted with back pain and weakness with acute renal failure. Pt unavailable times 2 this am on visit.  Past Medical History  Diagnosis Date  . Anxiety   . Asthma   . Arthritis   . Depression   . Diabetes mellitus without complication ([place])   . GERD (gastroesophageal reflux disease)   . Hypertension   . COPD (chronic obstructive pulmonary disease) ([facility])      Diet Order:  Diet NPO time specified    Current Nutrition: Pt currently NPO. Limited documentation of po intake since admission as pt on isolation. RD notes last night pt had outside food and fell asleep while eating.   Food/Nutrition-Related History: Per MST no decrease in appetite PTA.   Scheduled Medications:  . ARIPiprazole  5 mg Oral Daily  . Chlorhexidine Gluconate Cloth  6 each Topical Q0600  . darifenacin  7.5 mg Oral Daily  . DULoxetine  120 mg Oral Daily  . glimepiride  4 mg Oral Q breakfast  . heparin  5,000 Units Subcutaneous 3 times per day  . insulin aspart  0-5 Units Subcutaneous QHS  . insulin aspart  0-9 Units Subcutaneous TID WC  . lipase/protease/amylase  36,000 Units Oral BID BM  . lipase/protease/amylase  72,000 Units Oral TID WC  . mupirocin ointment  1 application Nasal BID  . OxyCODONE  15 mg Oral BID  . pantoprazole  40 mg Oral Daily  . polyethylene glycol  17 g Oral Daily  . ramelteon  8 mg Oral QHS  . sodium chloride  3 mL  Intravenous Q12H    Continuous Medications:  . sodium chloride 75 mL/hr at 02/21/15 1105     Electrolyte/Renal Profile and Glucose Profile:   Recent Labs Lab 02/19/15 2100 02/20/15 0656 02/21/15 0943  NA 137 140 139  K 3.2* 4.0 4.5  CL 103 105 105  CO2 27 28 25   BUN 25* 15 10  CREATININE 2.71* 1.42* 0.91  CALCIUM 7.9* 8.0* 8.3*  GLUCOSE 128* 146* 168*   Protein Profile:  Recent Labs Lab 02/19/15 1438 02/20/15 0656 02/21/15 0943  ALBUMIN 3.3* 2.8* 3.0*    Gastrointestinal Profile: Last BM: unknown, per Nsg documentation, abdomen obese distended and non-tender with active BS   Nutrition-Focused Physical Exam Findings:  Unable to complete Nutrition-Focused physical exam at this time.    Weight Change: Per MST no decrease in weight PTA. RD notes weight in ED on admission of 234lbs   Skin:   reviewed no issues   Height:   Ht Readings from Last 1 Encounters:  02/19/15 5\' 2"  (1.575 m)    Weight:   Wt Readings from Last 1 Encounters:  02/19/15 232 lb 5.8 oz (105.4 kg)    Ideal Body Weight:   50kg  BMI:  Body mass index is 42.49 kg/(m^2).  Estimated Nutritional Needs:   Kcal:  using IBW of 50kg, BEE: 1103kcals, TEE: (IF 1.1-1.3)(AF  1.3) [id number]  Protein:  40-50g protein (0.8-1.0g/kg) using IBW of 50kg  Fluid:  1250-1515mL of fluid (25-59mL/kg) using IBW of 50kg  EDUCATION NEEDS:   Education needs no appropriate at this time   [place], RD, LDN Pager [phone]

## 2015-02-21 NOTE — Care Management (Signed)
Patient presents from home in ARF.  Patient states that she lives at home with her boyfriend and daughter.  Patient states that she does not have any issues obtaining her medications, and that she drives when needed.  Patient states that she has a wheelchair, walker, and bedpan at home.  Patient states that she is not open to any home health services.  Per report there were concerns expressed by the patient's mother about poly substance abuse.  CSW was completed.  Patient states that she is ready to go home and has no concerns.  RNCM signing off

## 2015-02-23 ENCOUNTER — Ambulatory Visit: Payer: Medicare Other | Admitting: Physical Therapy

## 2015-03-01 NOTE — Discharge Summary (Signed)
[facility] [place] NAME: Quilla Freeze    MR#:  [id number]  DATE OF BIRTH:  Sep 06, 1970  DATE OF ADMISSION:  02/19/2015 ADMITTING PHYSICIAN: Aldean Jewett, MD  DATE OF DISCHARGE: 02/21/2015  5:02 PM  PRIMARY CARE PHYSICIAN: Vista Mink, FNP    ADMISSION DIAGNOSIS:  Acute renal failure (ARF) (HCC) [N17.9] Bilirubin in urine [R82.2] Abdominal pain [R10.9] Hypotension [I95.9] Bilateral low back pain without sciatica [M54.5] Acute renal failure, unspecified acute renal failure type ([place]) [N17.9]  DISCHARGE DIAGNOSIS:  Active Problems:   Acute renal failure (ARF) ([place])   SECONDARY DIAGNOSIS:   Past Medical History  Diagnosis Date  . Anxiety   . Asthma   . Arthritis   . Depression   . Diabetes mellitus without complication ([place])   . GERD (gastroesophageal reflux disease)   . Hypertension   . COPD (chronic obstructive pulmonary disease) ([place])     HOSPITAL COURSE:   #1 acute renal failure: Likely due to multiple medications including  lisinopril, meloxicam, etodolac, metformin as well as hypotension. She reported cc that she's been eating and drinking normally has not had decreased urine output.  - Improved with IV fluids. Discontinued  nephrotoxic agents meloxicam, metformin, etodolac and lisiniopril.  Electrolytes are stable.  -creat 3.0--->1.42--> 0.91  #2 hypertension: At this point she is hypotensive.  Improved with  IV fluids. She does not appear septic. No chest pain. Hypotension may be due to medication effect. Fall risk due to hypotension. Seen by PT , recs home PT  #3 chronic back pain: .pt is wheel chair bound .  She does take chronic opiates which were held  until blood pressure improved  , resumed opiates at d/c to prevent opiate withdrawal. Pt is to f/u with  Op pain management   #4 abdominal pain: She has mild tenderness to palpation of the right side of her abdomen. U/S abd with fatty  liver.  She states that bowel movements have been normal. She is not nauseated, states that she would like a meal.  #5 Diabetes mellitus type 2: Start sliding scale  #6 Transaminitis :  possibly due to fatty liver disease. She has had a recent abdominal ultrasound in September 2016 by [facility] which shows steatosis. Rpt u/s revealed fatty liver , pcp to rpt labs to mon[facility]ir.  DISCHARGE CONDITIONS:   fair  CONSULTS OBTAINED:    PT   PROCEDURES none  DRUG ALLERGIES:   Allergies  Allergen Reactions  . Cephalexin Hives  . Meloxicam Other (See Comments)    Damage kidney  . Baclofen Other (See Comments)    "makes cerebral palsy do adverse reaction on me"  . Morphine And Related Other (See Comments)    hallucinations  . Vantin  [Cefpodoxime]     Other reaction(s): Vomiting  . Ciprofloxacin Itching and Nausea And Vomiting  . Tape Rash    Other reaction(s): Other (See Comments) Uncoded Allergy. Allergen: Tape, Other Reaction: skin tears    DISCHARGE MEDICATIONS:   Discharge Medication List as of 02/21/2015  3:06 PM    CONTINUE these medications which have NOT CHANGED   Details  albuterol (PROVENTIL HFA;VENTOLIN HFA) 108 (90 BASE) MCG/ACT inhaler Inhale 2 puffs into the lungs 4 (four) times daily as needed. For shortness of breath and/or wheezing, Starting 09/22/2014, Until Discontinued, Historical Med    ARIPiprazole (ABILIFY) 5 MG tablet Take 5 mg by mouth daily., Until Discontinued, Historical Med    CREON 36000 UNITS  CPEP capsule Take 27,062-37,628 Units by mouth See admin instructions. Take 72000 units oral three times a day with meals, and take 36000 unit orally two times a day with snack., Starting 02/12/2015, Until Discontinued, Historical Med    DULoxetine (CYMBALTA) 60 MG capsule Take 120 mg by mouth daily. , Until Discontinued, Historical Med    gabapentin (NEURONTIN) 300 MG capsule Take 600 mg by mouth 3 (three) times daily. , Until Discontinued, Historical Med     glimepiride (AMARYL) 4 MG tablet Take 4 mg by mouth daily with breakfast., Until Discontinued, Historical Med    lubiprostone (AMITIZA) 24 MCG capsule Take 24 mcg by mouth 2 (two) times daily as needed for constipation. , Until Discontinued, Historical Med    omeprazole (PRILOSEC) 40 MG capsule Take 40 mg by mouth 2 (two) times daily., Until Discontinued, Historical Med    oxyCODONE (OXY IR/ROXICODONE) 5 MG immediate release tablet Take 5 mg by mouth every 8 (eight) hours as needed for moderate pain or severe pain. , Until Discontinued, Historical Med    OXYCONTIN 15 MG T12A 12 hr tablet Take 15 mg by mouth 2 (two) times daily., Starting 02/15/2015, Until Discontinued, Historical Med    pioglitazone (ACTOS) 45 MG tablet Take 45 mg by mouth daily., Until Discontinued, Historical Med    promethazine (PHENERGAN) 25 MG tablet Take 25 mg by mouth 3 (three) times daily as needed for nausea or vomiting. , Until Discontinued, Historical Med    ramelteon (ROZEREM) 8 MG tablet Take 8 mg by mouth at bedtime., Until Discontinued, Historical Med    solifenacin (VESICARE) 5 MG tablet Take 5 mg by mouth daily., Until Discontinued, Historical Med    tiZANidine (ZANAFLEX) 4 MG tablet Take 4 mg by mouth 3 (three) times daily. , Until Discontinued, Historical Med      STOP taking these medications     etodolac (LODINE) 400 MG tablet      hydrochlorothiazide (HYDRODIURIL) 25 MG tablet      lisinopril (PRINIVIL,ZESTRIL) 20 MG tablet      metFORMIN (GLUCOPHAGE) 500 MG tablet      prochlorperazine (COMPAZINE) 10 MG tablet          DISCHARGE INSTRUCTIONS:   Activity as tolerated, patient is wheelchair bound Diet diabetic Follow-up with primary care physician at [facility] in 3-5 days Follow-up with pain management in a week-  DIET:  ada  DISCHARGE CONDITION:  fair  ACTIVITY:  As tolerated , pt is wheel chair bound  OXYGEN:  Home Oxygen: no  DISCHARGE LOCATION:  home  If you experience  worsening of your admission symptoms, develop shortness of breath, life threatening emergency, suicidal or homicidal thoughts you must seek medical attention immediately by calling 911 or calling your MD immediately  if symptoms less severe.  You Must read complete instructions/literature along with all the possible adverse reactions/side effects for all the Medicines you take and that have been prescribed to you. Take any new Medicines after you have completely understood and accpet all the possible adverse reactions/side effects.   Please note  You were cared for by a hospitalist during your hospital stay. If you have any questions about your discharge medications or the care you received while you were in the hospital after you are discharged, you can call the unit and asked to speak with the hospitalist on call if the hospitalist that took care of you is not available. Once you are discharged, your primary care physician will handle any further medical issues.  Please note that NO REFILLS for any discharge medications will be authorized once you are discharged, as it is imperative that you return to your primary care physician (or establish a relationship with a primary care physician if you do not have one) for your aftercare needs so that they can reassess your need for medications and monitor your lab values.     Today  Chief Complaint  Patient presents with  . Back Pain    Pt seems to be lethargic but answers almost all questions appropriately , mainly regarding her pain meds and muscle relaxants. She reports that she is wheel chair bound and wants her pain meds and muscle relaxants asap. Reports abd pain but its chronic and f/u with op pain mangemnent  ROS:  CONSTITUTIONAL: Denies fevers, chills. Denies any fatigue, weakness.  EYES: Denies blurry vision, double vision, eye pain. EARS, NOSE, THROAT: Denies tinnitus, ear pain, hearing loss. RESPIRATORY: Denies cough, wheeze, shortness of  breath.  CARDIOVASCULAR: Denies chest pain, palpitations, edema.  GASTROINTESTINAL: Denies nausea, vomiting, diarrhea, has ch abdominal pain. Denies bright red blood per rectum. GENITOURINARY: Denies dysuria, hematuria. ENDOCRINE: Denies nocturia or thyroid problems. HEMATOLOGIC AND LYMPHATIC: Denies easy bruising or bleeding. SKIN: Denies rash or lesion. MUSCULOSKELETAL:Has ch back pain  Denies pain in neck, shoulder, knees, hips or arthritic symptoms.  NEUROLOGIC: Denies paralysis, paresthesias.  PSYCHIATRIC: Denies anxiety or depressive symptoms.   VITAL SIGNS:  Blood pressure 121/65, pulse 94, temperature 98.1 F (36.7 C), temperature source Oral, resp. rate 18, height 5\' 2"  (1.575 m), weight 105.4 kg (232 lb 5.8 oz), last menstrual period 08/20/2014, SpO2 96 %.  I/O:  No intake or output data in the 24 hours ending 03/01/15 1309  PHYSICAL EXAMINATION:  GENERAL:  44 y.o.-year-old patient lying in the bed with no acute distress.  EYES: Pupils equal, round, reactive to light and accommodation. No scleral icterus. Extraocular muscles intact.  HEENT: Head atraumatic, normocephalic. Oropharynx and nasopharynx clear.  NECK:  Supple, no jugular venous distention. No thyroid enlargement, no tenderness.  LUNGS: Normal breath sounds bilaterally, no wheezing, rales,rhonchi or crepitation. No use of accessory muscles of respiration.  CARDIOVASCULAR: S1, S2 normal. No murmurs, rubs, or gallops.  ABDOMEN: Soft,no rebound tenderness non-distended. Bowel sounds present. No organomegaly or mass.  EXTREMITIES: No pedal edema, cyanosis, or clubbing.  NEUROLOGIC: Cranial nerves II through XII are intact. Muscle strength at her base line Sensation intact. Gait not checked.  PSYCHIATRIC: The patient is alert and oriented x 3.  SKIN: No obvious rash, lesion, or ulcer.   DATA REVIEW:    Microbiology Results  Results for orders placed or performed during the hospital encounter of 02/19/15  MRSA PCR  Screening     Status: Abnormal   Collection Time: 02/19/15  7:30 PM  Result Value Ref Range Status   MRSA by PCR POSITIVE (A) NEGATIVE Final    Comment:        The GeneXpert MRSA Assay (FDA approved for NASAL specimens only), is one component of a comprehensive MRSA colonization surveillance program. It is not intended to diagnose MRSA infection nor to guide or monitor treatment for MRSA infections. CRITICAL RESULT CALLED TO, READ BACK BY AND VERIFIED WITH:  BETH BUONO AT 2107 02/19/15 SDR     RADIOLOGY:  No results found.  EKG:   Orders placed or performed in visit on 04/15/14  . EKG 12-Lead      Management plans discussed with the patient, family and they are in agreement.  CODE STATUS:   TOTAL TIME TAKING CARE OF THIS PATIENT: 45 minutes.    @[facility] @  on 03/01/2015 at 1:09 PM  Between 7am to 6pm - Pager - [phone]  After 6pm go to [email or web address] - password [email or web address] [facility]  Office  [phone]  CC: Primary care physician; Vista Mink, FNP

## 2015-03-02 ENCOUNTER — Ambulatory Visit: Payer: Medicare Other | Admitting: Physical Therapy

## 2015-03-07 ENCOUNTER — Ambulatory Visit: Payer: Medicare Other | Admitting: Physical Therapy

## 2015-03-09 ENCOUNTER — Ambulatory Visit: Payer: Medicare Other | Admitting: Physical Therapy

## 2015-03-14 ENCOUNTER — Ambulatory Visit: Payer: Medicare Other | Admitting: Physical Therapy

## 2015-03-16 ENCOUNTER — Ambulatory Visit: Payer: Medicare Other | Attending: Physical Medicine and Rehabilitation | Admitting: Physical Therapy

## 2015-03-16 ENCOUNTER — Encounter: Payer: Self-pay | Admitting: Physical Therapy

## 2015-03-16 DIAGNOSIS — M25561 Pain in right knee: Secondary | ICD-10-CM | POA: Diagnosis present

## 2015-03-16 DIAGNOSIS — R262 Difficulty in walking, not elsewhere classified: Secondary | ICD-10-CM | POA: Insufficient documentation

## 2015-03-16 DIAGNOSIS — M6281 Muscle weakness (generalized): Secondary | ICD-10-CM | POA: Diagnosis present

## 2015-03-16 DIAGNOSIS — M25562 Pain in left knee: Secondary | ICD-10-CM | POA: Insufficient documentation

## 2015-03-17 NOTE — Therapy (Signed)
[facility] [address] [place], [address] Phone: [phone]   Fax:  [phone]  Physical Therapy Treatment  Patient Details  Name: Traci Davis MRN: [id number] Date of Birth: 08/31/1970 No Data Recorded  Encounter Date: 03/16/2015      PT End of Session - 03/16/15 1611    Visit Number 9   Number of Visits 36   Date for PT Re-Evaluation 04/27/15   Authorization Type 9   Authorization Time Period 10   PT Start Time 1600   PT Stop Time 1645   PT Time Calculation (min) 45 min   Activity Tolerance Patient limited by fatigue;Patient limited by pain   Behavior During Therapy [facility] for tasks assessed/performed      Past Medical History  Diagnosis Date  . Anxiety   . Asthma   . Arthritis   . Depression   . Diabetes mellitus without complication ([place])   . GERD (gastroesophageal reflux disease)   . Hypertension   . COPD (chronic obstructive pulmonary disease) [facility])     Past Surgical History  Procedure Laterality Date  . Joint replacement Right     both knees partial replacements dates unknown  . Cesarean section  1995  . Cholecystectomy      There were no vitals filed for this visit.  Visit Diagnosis:  Muscle weakness (generalized) - Plan: PT plan of care cert/re-cert  Difficulty walking - Plan: PT plan of care cert/re-cert  Pain in both knees - Plan: PT plan of care cert/re-cert      Subjective Assessment - 03/16/15 1604    Subjective Paitent reports she has had a fall at home since previous session and has been hospitalized for kidney failure and she has back pain and spasms that have been treated and she is now released to return to physical therapy for further intervention. She reports she is  still having weakness and difficulty with standing and walking since falling and is walking with walker and brace on left LE.    Patient is accompained by: Family member  daughter   Limitations  Walking;Standing   How long can you stand comfortably? not at all    How long can you walk comfortably? using walker, ~20 feet at the most   Patient Stated Goals Patient would like to be able to walk and have increased strength and endurance in order to perform household chores and [place] in [place] for shopping   Currently in Pain? Yes   Pain Score 4    Pain Location Back   Pain Orientation Lower   Pain Descriptors / Indicators Aching;Sore   Pain Type Chronic pain   Pain Onset More than a month ago   Pain Frequency Intermittent   Aggravating Factors  lying on back, sitting   Pain Relieving Factors medication   Effect of Pain on Daily Activities unable to perform household activities and requires assistance to put on socks and shoes   Multiple Pain Sites No      Objective: Patient arrived in clinic in wheelchair, unable to walk with walker into clinic due to fatigue and legs giving out Posture with transfers sit to stand and pivot to treatment table independently with close supervision: forward flexed at hips, IR at hips, increased knee flexion  Flexibility; decreased hamstring length with SLR to ~40 degrees bilaterally (increased tone)           [facility] Adult PT Treatment/Exercise - 03/16/15 1613  Exercises   Exercises Other Exercises   Other Exercises  Supine lying: patient performed exercises with guidance, verbal and tactile cues and demonstration of PT:Hip flexion/extension x 10 reps with 45 cm ball for support, hamstring stretching x 10 reps with LE's on ball and guided by therapist, calf stretching x 5 reps x 20 second holds, hip abduction with AAROM 10 reps, stability ball under both LE's for lower trunk rotation x 10, isometric hip abduction x 10 reps each LE and ER of hips with isometric holds x 10 reps each                       Patient response to treatment: improved ability to perform exercises with assistance and guidance/verbal cuing and able to transfer to  [facility] following therapy with improved posture, decreased forward flexion at hips, transferred to car with close supervision                PT Long Term Goals - 03/16/15 [place] #1   Title Patient will demonstrate improved endurance and strength with standing for household activties in kitchen for 8 min. without having to rest by 03/28/2015   Baseline Patient requires rest periods to complete kitchen tasks involving standing for 10-15 min. (note: received brace back for left LE  01/25/2015)   Status Revised   PT LONG TERM GOAL #2   Title Patient will improve ROM in both hips with SLR to 60 degrees for improved mobility with transfers and walking with improved gait pattern by 04/27/2015   Baseline decreased Hamstring length with SLR to 40 degrees bilaterally (increased tone)   Status Revised   PT LONG TERM GOAL #3   Title Patient will be able to perform basic stretches for LE's hip ER and SLR with use of stability ball at home with minimal assist by 12/282016   Baseline Patient unable to perform exercises without assistance due to tone and decreased flexiblity and lack of help at home   Status Revised   PT LONG TERM GOAL #4   Title Patient will demonstrate improved gait pattern with more erect posture with use of appropriate assistive device x 25 feet with mild fatigue by 03/28/15   Baseline Gait: forward flexed posture, IR at hips, incresed knee flexion; unable to walk even short distances in home since recent illness/fall   Status Revised   PT LONG TERM GOAL #5   Title Patient will demonstrate imrpoved endurance and strength with standing for household activties in [place] for 15 min. or > without rest by 04/27/2015   Baseline unable to stand and perform activities for > 5 min.without multiple rest periods   Status Revised   PT LONG TERM GOAL #6   Title Patient will be independent with self management of select exercises and control of tone with home program ans  assistance as needed by 04/27/2015   Baseline Patient is limited in knowledge and abiltiy to perform exercises independently with minimal family support or outside assistance   Status On-going               Plan - 03/17/15 1618    Clinical Impression Statement Patient is returning following 4 week absence due to sickness. She continues wtih decreased mobilty and spasms in LE's and knee pain that limit her abiltiy to stand and perform daily activties. Patient has had a fall at home since previous session and has been hospitalized for kidney failure  and she has back pain and spasms that have been treated and she is now released to return to physical therapy for further intervention. She  is  still having weakness and difficulty with standing and walking since falling and is walking with walker and brace on left LE. She requires assistance for all household chores and personal care activities due to weakness and pain.     Pt will benefit from skilled therapeutic intervention in order to improve on the following deficits Decreased strength;Difficulty walking;Impaired flexibility;Decreased mobility;Decreased range of motion;Pain;Impaired tone   Rehab Potential Fair   PT Frequency 2x / week   PT Duration 12 weeks   PT Treatment/Interventions Gait training;Manual techniques;Therapeutic exercise;Electrical Stimulation;Moist Heat;Patient/family education;Passive range of motion;Ultrasound   PT Next Visit Plan flexiblity and strengthening exercises with assistance, estim./US  for pain control   Consulted and Agree with Plan of Care Patient        Problem List Patient Active Problem List   Diagnosis Date Noted  . Acute renal failure (ARF) ([place]) 02/19/2015    Jomarie Longs PT 03/17/2015, 6:59 PM  [facility] [address], [place], [address] Phone: [phone]   Fax:  [phone]  Name: MARANGELY SANANDRES MRN:  [id number] Date of Birth: Dec 04, 1970

## 2015-03-21 ENCOUNTER — Ambulatory Visit: Payer: Medicare Other | Admitting: Physical Therapy

## 2015-03-21 DIAGNOSIS — M545 Low back pain, unspecified: Secondary | ICD-10-CM | POA: Insufficient documentation

## 2015-03-21 DIAGNOSIS — G8929 Other chronic pain: Secondary | ICD-10-CM | POA: Insufficient documentation

## 2015-03-23 ENCOUNTER — Ambulatory Visit: Payer: Medicare Other | Admitting: Physical Therapy

## 2015-03-23 ENCOUNTER — Encounter: Payer: Self-pay | Admitting: Physical Therapy

## 2015-03-23 DIAGNOSIS — M25562 Pain in left knee: Secondary | ICD-10-CM

## 2015-03-23 DIAGNOSIS — M6281 Muscle weakness (generalized): Secondary | ICD-10-CM | POA: Diagnosis not present

## 2015-03-23 DIAGNOSIS — R262 Difficulty in walking, not elsewhere classified: Secondary | ICD-10-CM

## 2015-03-23 DIAGNOSIS — M25561 Pain in right knee: Secondary | ICD-10-CM

## 2015-03-23 NOTE — Therapy (Signed)
[facility] [address] [place], [address] Phone: [phone]   Fax:  [phone]  Physical Therapy Treatment  Patient Details  Name: Traci Davis MRN: [id number] Date of Birth: [birth year] No Data Recorded  Encounter Date: 03/23/2015      Davis End of Session - 03/23/15 1529    Visit Number 10   Number of Visits 36   Date for Davis Re-Evaluation 04/27/15   Authorization Type 10   Authorization Time Period 10   Davis Start Time 1525   Davis Stop Time 1606   Davis Time Calculation (min) 41 min   Activity Tolerance Patient limited by fatigue;Patient limited by lethargy;Patient limited by pain   Behavior During Therapy [facility] for tasks assessed/performed      Past Medical History  Diagnosis Date  . Anxiety   . Asthma   . Arthritis   . Depression   . Diabetes mellitus without complication ([place])   . GERD (gastroesophageal reflux disease)   . Hypertension   . COPD (chronic obstructive pulmonary disease) [facility])     Past Surgical History  Procedure Laterality Date  . Joint replacement Right     both knees partial replacements dates unknown  . Cesarean section  1995  . Cholecystectomy      There were no vitals filed for this visit.  Visit Diagnosis:  Muscle weakness (generalized)  Difficulty walking  Pain in both knees      Subjective Assessment - 03/23/15 1526    Subjective Patient reports she is feels better than the other day. she is not wearing brace on her left LE today.    Patient Stated Goals Patient would like to be able to walk and have increased strength and endurance in order to perform household chores and [place] in community for shopping   Currently in Pain? Yes   Pain Score --  mild pain in back and knees today   Pain Location Other (Comment)  in general back and knees   Pain Descriptors / Indicators Aching   Pain Type Chronic pain   Pain Onset More than a month ago        Objective;  Patient arrived to clinic in wheelchair, able to transfer to treatment table with close supervision of therapist, required minimal to moderate assistance to lie supine for exercises and to transfer supine to sit following exercises Tone: increased in both LE's with decreased ankle DF and hip rotation bilaterally        [facility] Adult Davis Treatment/Exercise - 03/23/15 1528    Exercises   Exercises Other Exercises   Other Exercises  Supine lying: patient performed exercises with guidance, verbal and tactile cues and demonstration of Davis:Hip flexion/extension x 10 reps with 45 cm ball for support, hamstring stretching x 10 reps with LE's on ball and guided by therapist, calf stretching x 5 reps x 20 second holds, hip abduction with AAROM 10 reps, stability ball under both LE's for lower trunk rotation x 10, isometric hip abduction x 10 reps each LE and ER of hips with isometric holds x 10 reps each   Manual Therapy   Manual Therapy Soft tissue mobilization   Manual therapy comments increased muscles spasms and trigger points along both hip/gluteal regions, gluteus medius and lumbar spine paraspinal muscles right >left side   Soft tissue mobilization STM  To lumbar spine superficial techniques to improve elasticity and decrease pain in lower back with patient in sitting  Patient response to treatment: improved soft tissue elasticity with decreased tenderness and pain by at least 50% following STM,  Improved flexibility in both hips with decreased pain in LE's following treatment, required assistance and verbal cuing to perform all exericses          Davis Education - 03/23/15 1630    Education provided Yes   Education Details instructed in exercies to improved mobility with decreased pain in back, LE's   Person(s) Educated Patient   Methods Explanation;Demonstration;Verbal cues   Comprehension Verbalized understanding;Returned demonstration;Verbal cues required              Davis Long Term Goals - 03/16/15 1645    Davis LONG TERM GOAL #1   Title Patient will demonstrate improved endurance and strength with standing for household activties in kitchen for 8 min. without having to rest by 03/28/2015   Baseline Patient requires rest periods to complete kitchen tasks involving standing for 10-15 min. (note: received brace back for left LE  01/25/2015)   Status Revised   Davis LONG TERM GOAL #2   Title Patient will improve ROM in both hips with SLR to 60 degrees for improved mobility with transfers and walking with improved gait pattern by 04/27/2015   Baseline decreased Hamstring length with SLR to 40 degrees bilaterally (increased tone)   Status Revised   Davis LONG TERM GOAL #3   Title Patient will be able to perform basic stretches for LE's hip ER and SLR with use of stability ball at home with minimal assist by 12/282016   Baseline Patient unable to perform exercises without assistance due to tone and decreased flexiblity and lack of help at home   Status Revised   Davis LONG TERM GOAL #4   Title Patient will demonstrate improved gait pattern with more erect posture with use of appropriate assistive device x 25 feet with mild fatigue by 03/28/15   Baseline Gait: forward flexed posture, IR at hips, incresed knee flexion; unable to walk even short distances in home since recent illness/fall   Status Revised   Davis LONG TERM GOAL #5   Title Patient will demonstrate imrpoved endurance and strength with standing for household activties in [place] for 15 min. or > without rest by 04/27/2015   Baseline unable to stand and perform activities for > 5 min.without multiple rest periods   Status Revised   Davis LONG TERM GOAL #6   Title Patient will be independent with self management of select exercises and control of tone with home program ans assistance as needed by 04/27/2015   Baseline Patient is limited in knowledge and abiltiy to perform exercises independently with  minimal family support or outside assistance   Status On-going               Plan - 03/23/15 1724    Clinical Impression Statement Patient demonstrated decreased spasms in lower back right side with some relief of back pain today. She continues with stiffness in LE hip and knees and decreased strenght that limit her abiltiy to walk and stand to perform ADL's for household chores and community ambulation and will benefit from additaional physical therapy intervention to achieve less than 40% impairment  for walking and moving around.    Davis will benefit from skilled therapeutic intervention in order to improve on the following deficits Decreased strength;Difficulty walking;Impaired flexibility;Decreased mobility;Decreased range of motion;Pain;Impaired tone   Davis Frequency 2x / week   Davis Duration 12 weeks   Davis Treatment/Interventions  Gait training;Manual techniques;Therapeutic exercise;Electrical Stimulation;Moist Heat;Patient/family education;Passive range of motion;Ultrasound   Davis Next Visit Plan flexiblity and strengthening exercises with assistance, estim./US  for pain control          G-Codes - 04/21/2015 1721    Functional Assessment Tool Used LEFS, clinical judgment, ROM and strength deficits, pain scale   Functional Limitation Mobility: Walking and moving around   Mobility: Walking and Moving Around Current Status [id number]) At least 60 percent but less than 80 percent impaired, limited or restricted   Mobility: Walking and Moving Around Goal Status [phone]) At least 20 percent but less than 40 percent impaired, limited or restricted      Problem List Patient Active Problem List   Diagnosis Date Noted  . Acute renal failure (ARF) ([place]) 02/19/2015    Traci Davis 03/24/2015, 6:44 AM  [facility] [address], [place], [address] Phone: [phone]   Fax:  [phone]  Name: Traci Davis MRN:  [id number] Date of Birth: 05-07-1970

## 2015-03-28 ENCOUNTER — Ambulatory Visit: Payer: Medicare Other | Admitting: Physical Therapy

## 2015-03-30 ENCOUNTER — Encounter: Payer: Self-pay | Admitting: Physical Therapy

## 2015-03-30 ENCOUNTER — Ambulatory Visit: Payer: Medicare Other | Admitting: Physical Therapy

## 2015-03-30 DIAGNOSIS — M6281 Muscle weakness (generalized): Secondary | ICD-10-CM

## 2015-03-30 DIAGNOSIS — R262 Difficulty in walking, not elsewhere classified: Secondary | ICD-10-CM

## 2015-03-30 NOTE — Therapy (Signed)
[facility] [address], [place], [address] Phone: [phone]   Fax:  [phone]  Physical Therapy Treatment  Patient Details  Name: Traci Davis MRN: [id number] Date of Birth: 02-11-1971 No Data Recorded  Encounter Date: 03/30/2015      PT End of Session - 03/30/15 1524    Visit Number 11   Number of Visits 36   Date for PT Re-Evaluation 04/27/15   Authorization Type 11   Authorization Time Period 20   PT Start Time 1520   PT Stop Time 1600   PT Time Calculation (min) 40 min   Activity Tolerance Patient limited by fatigue;Patient limited by lethargy;Patient limited by pain   Behavior During Therapy [facility] for tasks assessed/performed      Past Medical History  Diagnosis Date  . Anxiety   . Asthma   . Arthritis   . Depression   . Diabetes mellitus without complication ([facility])   . GERD (gastroesophageal reflux disease)   . Hypertension   . COPD (chronic obstructive pulmonary disease) [facility])     Past Surgical History  Procedure Laterality Date  . Joint replacement Right     both knees partial replacements dates unknown  . Cesarean section  1995  . Cholecystectomy      There were no vitals filed for this visit.  Visit Diagnosis:  Muscle weakness (generalized)  Difficulty walking      Subjective Assessment - 03/30/15 1522    Subjective Paitent reports she had injections yesterday in back of legs for hamstring muscles (Botox) for control of tone. She is having pain in back of thighs today.    Limitations Walking;Standing   Patient Stated Goals Patient would like to be able to walk and have increased strength and endurance in order to perform household chores and [place] in community for shopping   Currently in Pain? No/denies        Objective;  Patient arrived to clinic in wheelchair, able to transfer to treatment table with close supervision of therapist, required minimal to  moderate assistance to lie supine for exercises and to transfer supine to sit following exercises Tone: increased in both LE's with decreased ankle DF and hip rotation and SLR  bilaterally       [facility] Adult PT Treatment/Exercise - 03/30/15 1523    Exercises   Exercises Other Exercises   Other Exercises  Supine lying: patient performed exercises with guidance, verbal and tactile cues and demonstration of PT:Hip flexion/extension x 10 reps with 45 cm ball for support, hamstring stretching x 10 reps with LE's on ball and guided by therapist, calf stretching x 5 reps x 20 second holds, hip abduction with AAROM 10 reps, stability ball under both LE's for lower trunk rotation x 10, isometric hip abduction x 10 reps each LE and ER of hips with isometric holds x 10 reps each                      Patient response to treatment: Improved flexibility in both hips (hamstring muscles)  with SLR improved by 10 -15 degrees from 30 to 45 degrees with decreased pain in LE's following treatment, required assistance and verbal cuing to perform all exericses          PT Education - 03/30/15 1600    Education provided Yes   Education Details HEP for stretching and AROM both LE's as able   Person(s) Educated Patient  Methods Explanation;Demonstration;Verbal cues   Comprehension Verbalized understanding             PT Long Term Goals - 03/16/15 1645    PT LONG TERM GOAL #1   Title Patient will demonstrate improved endurance and strength with standing for household activties in kitchen for 8 min. without having to rest by 03/28/2015   Baseline Patient requires rest periods to complete kitchen tasks involving standing for 10-15 min. (note: received brace back for left LE  01/25/2015)   Status Revised   PT LONG TERM GOAL #2   Title Patient will improve ROM in both hips with SLR to 60 degrees for improved mobility with transfers and walking with improved gait pattern by 04/27/2015   Baseline  decreased Hamstring length with SLR to 40 degrees bilaterally (increased tone)   Status Revised   PT LONG TERM GOAL #3   Title Patient will be able to perform basic stretches for LE's hip ER and SLR with use of stability ball at home with minimal assist by 12/282016   Baseline Patient unable to perform exercises without assistance due to tone and decreased flexiblity and lack of help at home   Status Revised   PT LONG TERM GOAL #4   Title Patient will demonstrate improved gait pattern with more erect posture with use of appropriate assistive device x 25 feet with mild fatigue by 03/28/15   Baseline Gait: forward flexed posture, IR at hips, incresed knee flexion; unable to walk even short distances in home since recent illness/fall   Status Revised   PT LONG TERM GOAL #5   Title Patient will demonstrate imrpoved endurance and strength with standing for household activties in [place] for 15 min. or > without rest by 04/27/2015   Baseline unable to stand and perform activities for > 5 min.without multiple rest periods   Status Revised   PT LONG TERM GOAL #6   Title Patient will be independent with self management of select exercises and control of tone with home program ans assistance as needed by 04/27/2015   Baseline Patient is limited in knowledge and abiltiy to perform exercises independently with minimal family support or outside assistance   Status On-going               Plan - 03/30/15 1859    Clinical Impression Statement Patient demonstrated improved flexibiltiy in both LE's with treatment and repetition. She is aware of the need to perform exercises at home in order to maintain flexibiltiy following BOTOX injections. She was limited in core exercises today due to not feeling well with wheezing.    Pt will benefit from skilled therapeutic intervention in order to improve on the following deficits Decreased strength;Difficulty walking;Impaired flexibility;Decreased  mobility;Decreased range of motion;Pain;Impaired tone   Rehab Potential Fair   PT Frequency 2x / week   PT Duration 12 weeks   PT Treatment/Interventions Gait training;Manual techniques;Therapeutic exercise;Electrical Stimulation;Moist Heat;Patient/family education;Passive range of motion;Ultrasound   PT Next Visit Plan flexiblity and strengthening exercises with assistance, estim./US  for pain control        Problem List Patient Active Problem List   Diagnosis Date Noted  . Acute renal failure (ARF) ([place]) 02/19/2015    Jomarie Longs PT 03/30/2015, 7:03 PM  [place] [address], [place], [address] Phone: [phone]   Fax:  [phone]  Name: JEANNEDARC KOBLE MRN: [id number] Date of Birth: 10/20/1970

## 2015-04-01 ENCOUNTER — Ambulatory Visit: Payer: Medicare Other | Admitting: Physical Therapy

## 2015-04-04 ENCOUNTER — Ambulatory Visit: Payer: Medicare Other | Admitting: Physical Therapy

## 2015-04-05 ENCOUNTER — Ambulatory Visit: Payer: Medicare Other | Admitting: Physical Therapy

## 2015-04-06 ENCOUNTER — Encounter: Payer: Medicare Other | Admitting: Physical Therapy

## 2015-04-07 ENCOUNTER — Ambulatory Visit: Payer: Medicare Other | Admitting: Physical Therapy

## 2015-04-11 ENCOUNTER — Ambulatory Visit: Payer: Medicare Other | Attending: Physical Medicine and Rehabilitation | Admitting: Physical Therapy

## 2015-04-11 ENCOUNTER — Encounter: Payer: Self-pay | Admitting: Physical Therapy

## 2015-04-11 DIAGNOSIS — M25562 Pain in left knee: Secondary | ICD-10-CM | POA: Diagnosis present

## 2015-04-11 DIAGNOSIS — M6281 Muscle weakness (generalized): Secondary | ICD-10-CM | POA: Insufficient documentation

## 2015-04-11 DIAGNOSIS — R262 Difficulty in walking, not elsewhere classified: Secondary | ICD-10-CM | POA: Diagnosis present

## 2015-04-11 DIAGNOSIS — M25561 Pain in right knee: Secondary | ICD-10-CM | POA: Diagnosis present

## 2015-04-12 NOTE — Therapy (Signed)
[facility] [address], [place], [address] Phone: [phone]   Fax:  [phone]  Physical Therapy Treatment  Patient Details  Name: Traci Davis MRN: [id number] Date of Birth: 25-Jun-1970 No Data Recorded  Encounter Date: 04/11/2015      PT End of Session - 04/11/15 1545    Visit Number 12   Number of Visits 36   Date for PT Re-Evaluation 04/27/15   Authorization Type 12   Authorization Time Period 20   PT Start Time 1530   PT Stop Time 1615   PT Time Calculation (min) 45 min   Activity Tolerance Patient tolerated treatment well   Behavior During Therapy [facility] for tasks assessed/performed      Past Medical History  Diagnosis Date  . Anxiety   . Asthma   . Arthritis   . Depression   . Diabetes mellitus without complication ([place])   . GERD (gastroesophageal reflux disease)   . Hypertension   . COPD (chronic obstructive pulmonary disease) [facility])     Past Surgical History  Procedure Laterality Date  . Joint replacement Right     both knees partial replacements dates unknown  . Cesarean section  1995  . Cholecystectomy      There were no vitals filed for this visit.  Visit Diagnosis:  Muscle weakness (generalized)  Difficulty walking      Subjective Assessment - 04/11/15 1532    Subjective Better today than the other day   Currently in Pain? No/denies       Objective: Patient required assistance to enter clinic in wheelchair with transfer out of car with close supervision of therapist Patient transferred [facility] to treatment table stand pivot independently with close supervision for safety (KAFO left LE in place), moderate assist of therapist to transfer sit to/from supine Tone: both LE's mild increased tone noted throughout session       [facility] Adult PT Treatment/Exercise - 04/11/15 2052    Exercises   Exercises Other Exercises   Other Exercises  Performed all exercises with  assistance of therapist with verbal and tactile cues: supine AAROM both LE's for hip flexion, abduction, rotations and SLR for hamstring stretch, knee flexion/extension and ankles x 5-10 reps, active resistive exercises for hip abduction, adduction, extension, lower trunk rotation with both LE's on 45cm ball x 15 reps each right/left with assistance to keep LE's on ball, sitting manual resistive knee extension and flexion x 10 reps each, supine core with 4# weight and bilateral flexion overhead x 15 reps, seated lumbar extension with resistive band x 15 reps with assistance of therapist and shoulder rows to hips x 15 reps with verbal cues for correct posture/position and assistance.                     Patient response to treatment: patient with improved control with each exercise with verbal cues and repetition, mild fatigue noted with good endurance throughout session today. Patient required assistance for most exercises due to weakness and tone in LE's and trunk muscles           PT Education - 04/11/15 1615    Education provided Yes   Education Details continue to work on home program for AROM and stretching   Person(s) Educated Patient   Methods Explanation;Demonstration   Comprehension Verbalized understanding;Returned demonstration             PT Long Term Goals - 03/16/15 1645  PT LONG TERM GOAL #1   Title Patient will demonstrate improved endurance and strength with standing for household activties in kitchen for 8 min. without having to rest by 03/28/2015   Baseline Patient requires rest periods to complete kitchen tasks involving standing for 10-15 min. (note: received brace back for left LE  01/25/2015)   Status Revised   PT LONG TERM GOAL #2   Title Patient will improve ROM in both hips with SLR to 60 degrees for improved mobility with transfers and walking with improved gait pattern by 04/27/2015   Baseline decreased Hamstring length with SLR to 40 degrees  bilaterally (increased tone)   Status Revised   PT LONG TERM GOAL #3   Title Patient will be able to perform basic stretches for LE's hip ER and SLR with use of stability ball at home with minimal assist by 12/282016   Baseline Patient unable to perform exercises without assistance due to tone and decreased flexiblity and lack of help at home   Status Revised   PT LONG TERM GOAL #4   Title Patient will demonstrate improved gait pattern with more erect posture with use of appropriate assistive device x 25 feet with mild fatigue by 03/28/15   Baseline Gait: forward flexed posture, IR at hips, incresed knee flexion; unable to walk even short distances in home since recent illness/fall   Status Revised   PT LONG TERM GOAL #5   Title Patient will demonstrate imrpoved endurance and strength with standing for household activties in [place] for 15 min. or > without rest by 04/27/2015   Baseline unable to stand and perform activities for > 5 min.without multiple rest periods   Status Revised   PT LONG TERM GOAL #6   Title Patient will be independent with self management of select exercises and control of tone with home program ans assistance as needed by 04/27/2015   Baseline Patient is limited in knowledge and abiltiy to perform exercises independently with minimal family support or outside assistance   Status On-going               Plan - 04/11/15 1615    Clinical Impression Statement Patient demonstrated improved flexibity and AROM today as compared to previous session. She required assistance to perform all exercises and was able to perform with increased intensity of resistance for LE strengthening. She continues with limitations of weakness and fatigue which limits ability to walk without brace or use walker and has decreased endurance due to other medical issues (being worked up for blood disorder). She continues to benefit from physical therapy to address flexibility and weakness.    Pt  will benefit from skilled therapeutic intervention in order to improve on the following deficits Decreased strength;Difficulty walking;Impaired flexibility;Decreased mobility;Decreased range of motion;Pain;Impaired tone   Rehab Potential Fair   PT Frequency 2x / week   PT Duration 12 weeks   PT Treatment/Interventions Gait training;Manual techniques;Therapeutic exercise;Electrical Stimulation;Moist Heat;Patient/family education;Passive range of motion;Ultrasound   PT Next Visit Plan flexiblity and strengthening exercises with assistance, estim./US  for pain control        Problem List Patient Active Problem List   Diagnosis Date Noted  . Acute renal failure (ARF) ([place]) 02/19/2015    Jomarie Longs PT 04/12/2015, 12:49 PM  [place] [address] [place], [address] Phone: [phone]   Fax:  [phone]  Name: TORUNN PARGA MRN: [id number] Date of Birth: 1970-11-16

## 2015-04-13 ENCOUNTER — Encounter: Payer: Medicare Other | Admitting: Physical Therapy

## 2015-04-14 ENCOUNTER — Ambulatory Visit: Payer: Medicare Other | Admitting: Physical Therapy

## 2015-04-14 ENCOUNTER — Encounter: Payer: Medicare Other | Admitting: Physical Therapy

## 2015-04-14 ENCOUNTER — Encounter: Payer: Self-pay | Admitting: Physical Therapy

## 2015-04-14 DIAGNOSIS — M25562 Pain in left knee: Secondary | ICD-10-CM

## 2015-04-14 DIAGNOSIS — M6281 Muscle weakness (generalized): Secondary | ICD-10-CM | POA: Diagnosis not present

## 2015-04-14 DIAGNOSIS — M25561 Pain in right knee: Secondary | ICD-10-CM

## 2015-04-14 DIAGNOSIS — R262 Difficulty in walking, not elsewhere classified: Secondary | ICD-10-CM

## 2015-04-14 NOTE — Therapy (Signed)
[facility] [address], [place], [address] Phone: [phone]   Fax:  [phone]  Physical Therapy Treatment  Patient Details  Name: Traci Davis MRN: [id number] Date of Birth: [birth year] No Data Recorded  Encounter Date: 04/14/2015      PT End of Session - 04/14/15 1437    Visit Number 13   Number of Visits 36   Date for PT Re-Evaluation 04/27/15   Authorization Type 13   Authorization Time Period 20   PT Start Time 1430   PT Stop Time 1502   PT Time Calculation (min) 32 min   Activity Tolerance Patient tolerated treatment well   Behavior During Therapy [facility] for tasks assessed/performed      Past Medical History  Diagnosis Date  . Anxiety   . Asthma   . Arthritis   . Depression   . Diabetes mellitus without complication ([place])   . GERD (gastroesophageal reflux disease)   . Hypertension   . COPD (chronic obstructive pulmonary disease) [facility])     Past Surgical History  Procedure Laterality Date  . Joint replacement Right     both knees partial replacements dates unknown  . Cesarean section  1995  . Cholecystectomy      There were no vitals filed for this visit.  Visit Diagnosis:  Muscle weakness (generalized)  Difficulty walking  Pain in both knees      Subjective Assessment - 04/14/15 1432    Subjective Patient reports being more stiff in LE's today and is concerned about having good/bad days with ability to walk at home without walker/brace. She is still not sure of results of blood work and is waiting to hear back from doctor regarding increased WBC.   Patient Stated Goals Patient would like to be able to walk and have increased strength and endurance in order to perform household chores and [place]   Currently in Pain? Yes   Pain Score 2    Pain Location Knee  both knees   Pain Descriptors / Indicators Aching   Pain Type Chronic pain   Pain Onset  More than a month ago   Pain Frequency Intermittent      Objective: Patient required assistance to enter clinic in wheelchair with transfer out of car with close supervision of therapist Patient transferred [facility] to treatment table stand pivot independently with close supervision for safety (no brace left LE in place), moderate assist of therapist to transfer sit to/from supine Tone: both LE's mild increased tone noted throughout session (more than previous session)           [facility] PT Treatment/Exercise - 04/14/15 1437    Exercises   Exercises Other Exercises   Other Exercises  Exercises performed with assistance, guidance and cuing of PT: supine bilateral hip flexor stretching with assistance x 3 reps each, ER/IR ROM exercises in supine lying each LE, hip abduction/adduction stretch and active resistive x 10 reps with manual graded resistance of therapist, hip/knee flexion with assistance, LTR with hip/knee flexed and assisted by therapist, calf stretches 3 x 20-30 second stretches                      Patient response to treatment: patient reported decreased stiffness and able to transfer back to [facility] with less difficulty and stiffness in hips and LE's, patient verbalized understanding of home program and progression/modificaiton of exercises and activity based on  how she felt and her body response with tone in LEs'                PT Long Term Goals - 03/16/15 1645    PT LONG TERM GOAL #1   Title Patient will demonstrate improved endurance and strength with standing for household activties in kitchen for 8 min. without having to rest by 03/28/2015   Baseline Patient requires rest periods to complete kitchen tasks involving standing for 10-15 min. (note: received brace back for left LE  01/25/2015)   Status Revised   PT LONG TERM GOAL #2   Title Patient will improve ROM in both hips with SLR to 60 degrees for improved mobility with transfers and walking with improved gait  pattern by 04/27/2015   Baseline decreased Hamstring length with SLR to 40 degrees bilaterally (increased tone)   Status Revised   PT LONG TERM GOAL #3   Title Patient will be able to perform basic stretches for LE's hip ER and SLR with use of stability ball at home with minimal assist by 12/282016   Baseline Patient unable to perform exercises without assistance due to tone and decreased flexiblity and lack of help at home   Status Revised   PT LONG TERM GOAL #4   Title Patient will demonstrate improved gait pattern with more erect posture with use of appropriate assistive device x 25 feet with mild fatigue by 03/28/15   Baseline Gait: forward flexed posture, IR at hips, incresed knee flexion; unable to walk even short distances in home since recent illness/fall   Status Revised   PT LONG TERM GOAL #5   Title Patient will demonstrate imrpoved endurance and strength with standing for household activties in May for 15 min. or > without rest by 04/27/2015   Baseline unable to stand and perform activities for > 5 min.without multiple rest periods   Status Revised   PT LONG TERM GOAL #6   Title Patient will be independent with self management of select exercises and control of tone with home program ans assistance as needed by 04/27/2015   Baseline Patient is limited in knowledge and abiltiy to perform exercises independently with minimal family support or outside assistance   Status On-going               Plan - 04/14/15 1541    Clinical Impression Statement Limited session due to patient request because she was going to hospital to see a friend. She demonstrated improved flexibility in both hips with abduction and hamstring flexibility and reported improved ability to transfer from treatment table to [facility] with less stiffness and difficulty.   Pt will benefit from skilled therapeutic intervention in order to improve on the following deficits Decreased strength;Difficulty  walking;Impaired flexibility;Decreased mobility;Decreased range of motion;Pain;Impaired tone   Rehab Potential Fair   Clinical Impairments Affecting Rehab Potential (+) motivated, age, previous good results with therapy intervention   (-) chronic condition/pain, multiple surgeries both LE's, significant hypertonicity/spasticity   PT Treatment/Interventions Gait training;Manual techniques;Therapeutic exercise;Electrical Stimulation;Moist Heat;Patient/family education;Passive range of motion;Ultrasound   PT Next Visit Plan flexiblity and strengthening exercises with assistance, estim./US  for pain control        Problem List Patient Active Problem List   Diagnosis Date Noted  . Acute renal failure (ARF) ([place]) 02/19/2015    Jomarie Longs PT 04/14/2015, 10:27 PM  [facility] [address] [place], [address] Phone: [phone]  Fax:  [phone]  Name: VERNIS SKOTNICKI MRN: [id number] Date of Birth: 11-29-1970

## 2015-04-18 ENCOUNTER — Ambulatory Visit: Payer: Medicare Other | Attending: Physical Medicine and Rehabilitation | Admitting: Physical Therapy

## 2015-04-20 ENCOUNTER — Ambulatory Visit: Payer: Medicare Other | Admitting: Physical Therapy

## 2015-04-27 ENCOUNTER — Ambulatory Visit: Payer: Medicare Other | Admitting: Physical Therapy

## 2015-05-04 ENCOUNTER — Encounter: Payer: Medicare Other | Admitting: Physical Therapy

## 2015-05-09 ENCOUNTER — Encounter: Payer: Medicare Other | Admitting: Physical Therapy

## 2015-05-11 ENCOUNTER — Encounter: Payer: Medicare Other | Admitting: Physical Therapy

## 2015-05-16 ENCOUNTER — Encounter: Payer: Medicare Other | Admitting: Physical Therapy

## 2015-05-18 ENCOUNTER — Encounter: Payer: Medicare Other | Admitting: Physical Therapy

## 2015-05-23 ENCOUNTER — Encounter: Payer: Medicare Other | Admitting: Physical Therapy

## 2015-05-25 ENCOUNTER — Encounter: Payer: Medicare Other | Admitting: Physical Therapy

## 2015-05-30 ENCOUNTER — Encounter: Payer: Medicare Other | Admitting: Physical Therapy

## 2015-06-01 ENCOUNTER — Encounter: Payer: Medicare Other | Admitting: Physical Therapy

## 2015-07-01 DIAGNOSIS — R42 Dizziness and giddiness: Secondary | ICD-10-CM | POA: Insufficient documentation

## 2015-07-01 DIAGNOSIS — F1190 Opioid use, unspecified, uncomplicated: Secondary | ICD-10-CM | POA: Insufficient documentation

## 2015-10-03 ENCOUNTER — Encounter: Payer: Self-pay | Admitting: Physical Therapy

## 2015-10-03 ENCOUNTER — Ambulatory Visit: Payer: Medicare Other | Attending: Physical Medicine and Rehabilitation | Admitting: Physical Therapy

## 2015-10-03 DIAGNOSIS — M25561 Pain in right knee: Secondary | ICD-10-CM

## 2015-10-03 DIAGNOSIS — M6281 Muscle weakness (generalized): Secondary | ICD-10-CM | POA: Diagnosis present

## 2015-10-03 DIAGNOSIS — R262 Difficulty in walking, not elsewhere classified: Secondary | ICD-10-CM | POA: Diagnosis present

## 2015-10-03 DIAGNOSIS — M25562 Pain in left knee: Secondary | ICD-10-CM

## 2015-10-03 NOTE — Therapy (Signed)
[facility] [address] [place], [address] Phone: [phone]   Fax:  [phone]  Physical Therapy Evaluation  Patient Details  Name: Traci Davis MRN: [id number] Date of Birth: 12/28/1970 Referring Provider: Leretha Dykes MD  Encounter Date: 10/03/2015      PT End of Session - 10/03/15 1805    Visit Number 1   Number of Visits 24   Date for PT Re-Evaluation 12/26/15   Authorization Type 1   Authorization Time Period 10 (G Code)   PT Start Time [id number]   PT Stop Time 1640   PT Time Calculation (min) 65 min   Equipment Utilized During Treatment Other (comment)  KAFO   Activity Tolerance Patient tolerated treatment well;Patient limited by pain   Behavior During Therapy [facility] for tasks assessed/performed      Past Medical History  Diagnosis Date  . Anxiety   . Asthma   . Arthritis   . Depression   . Diabetes mellitus without complication ([place])   . GERD (gastroesophageal reflux disease)   . Hypertension   . COPD (chronic obstructive pulmonary disease) [facility])     Past Surgical History  Procedure Laterality Date  . Joint replacement Right     both knees partial replacements dates unknown  . Cesarean section  1995  . Cholecystectomy      There were no vitals filed for this visit.       Subjective Assessment - 10/03/15 1552    Subjective Patient reports increased back and leg pain. Reports difficulty with transfering from laying down to sitting and prolonged walking. States she is able to walk for 3-4 mins before having to sit down due to SOB and increased pain. Pain in the legs at worst is a 10/10 it averages  a 7/10, 5/10 at the least. Patient describes back pain as sharp while the leg pain is achey sensation. Pain is aggravated when performing certain instantaneous movements in sitting and standing and reports increased back pain with rolling in the bed. Mentions alleviation of pain  with heat modalities and laying on back. Reports difficulty sleeping and needs to take medication to fall asleep.    Pertinent History Patient reports she has had problems with walking for years with worsening symptoms in knees and difficulty with walking and she underwent bilateral partial knee replacements to help with walking. Since her knee surgeries she has had increasing pain in left knee and has recently had to have a brace made to assist with left LE support. She is taking medication for muscle spasims to assist with decreasing tone in left LE with good results.States recent diagnosis of COPD. She also reports that she has received botox injections in her right hamstring muscles and will be getting more in the near future (this seems to help with her tone).   Limitations Walking;Standing   How long can you stand comfortably? not at all    How long can you walk comfortably? using walker, 3-4 min worth   Patient Stated Goals Patient would like to start walking longer distances again.    Currently in Pain? 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  No   Is the patient  reluctant to leave their home because of a fear of falling?  No   Home Environment   Living Environment Private residence   Living Arrangements Children   Type of [place] - 2 wheels;Walker - standard;Wheelchair - power;Bedside commode;Tub bench;Toilet riser;Hand held shower head   Prior Function   Level of Independence Independent with gait;Independent with transfers;Independent with household mobility with device   Leisure Spending time with family outside.   Cognition   Overall Cognitive Status Within Functional Limits for tasks assessed      Objective: Observation: Gait: Step to gait pattern with Rolator walker required contact guard assist and wheelchair follow to perform; Standing: min A with rollator hand held assist, Transferring sit<->stand: min A x (2) therapists  Posture: decreased erect posture with increased hip flexion in standing  Measurement:  R Hip AROM/MMT: Flexion:90, ABD: 20,  ER:20, IR:20, Ext: -10 L Hip AROM/MMT: Flexion:90, ABD: 20, ER:20, IR:20 L Knee AROM/MMT: Flexion: 100, Extension:-20 R Knee AROM/MMT: Flexion:100, Extension:0 Core control/strength decreased as noted with transfers supine to sit Hamstring Length: 40 degree bilaterally Hip flexors tight bilaterally  Ambulation: 8 feet with rollator walker  Outcome Measures:  Oswestry: 58% ( severe self-perceived lumbar disability) LEFS: 11/80 (severe self perceived disability)  Therapeutic Exercise: Patient performed exercises with guidance, verbal and tactile cues and demonstration of therapist: Hooklying: Hip Fall outs bilaterally-- x 10 LTRs -- x 10 bilaterally  Overhead stretches with deep breathing -- x3  PROM: Hip abduction stretching bilaterally -- 2 x 30sec PROM: Hip ext stretching -- x30sec PROM: Hip ER&IR stretching -- 3 x 30sec PROM: Hamstring stretch in supine -- 2 x 30sec bilaterally  Patient  response to treatment: Decreased pain and spasms by 33% after performing PROM to LE with improved ability to stand and walk with less stiffness and self reported pain in knees, hip, back. Good demonstration of hooklying hip fall outs requiring minimal cueing on hip position to perform with correct technique; assistance required to perform all exercises with correct technique/alignment           PT Education - 10/03/15 1758    Education provided Yes   Education Details HEP: hip fallouts in hooklying, overhead arm stretch, LTR in lying   Person(s) Educated Patient   Methods Explanation;Demonstration   Comprehension Verbalized understanding;Returned demonstration             PT Long Term Goals - 10/03/15 2011    PT LONG TERM GOAL #1   Title Patient will demonstrate ability to walk 30 ft with minor fatigue by 12/26/15 to demonstrate functional improvement in ability to ambulate   Baseline Ambulates 8 ft with increased fatigue  Status New   PT LONG TERM GOAL #2   Title Patient will improve ROM in both hips with SLR to 60 degrees by 12/26/15 for improved mobility with transfers and walking with improved gait pattern.   Baseline decreased Hamstring length with SLR to 40 degrees bilaterally (increased tone)   Status New   PT LONG TERM GOAL #3   Title Patient will demonstrate a LEFS score of 25/80 by 12/26/15 to show significant improvement and LE functions indicating improved ability to perform standing activties   Baseline LEFS: 11/80 (Severe self perceived dysfunction)   Status New   PT LONG TERM GOAL #4   Title Patient will demonstrate a MODI score of 40% by 12/26/15 to demonstrate singificant improvement in lumbar function and ability to ambulate.    Baseline MODI: 58% (severe-moderate self perceived dysfucntion)   Status New   PT LONG TERM GOAL #5   Title Patient will be independent with self management of select exercises and control of tone with home program and family assistance  as needed by 12/26/2015   Baseline Patient is limited in knowledge and abiltiy to perform exercises independently with minimal family support or outside assistance   Status New               Plan - 10/03/15 1707    Clinical Impression Statement Patient is a 45 year old right hand dominant female who presents with long history of spastic cerebral palsy with difficulty walking. She presents with increased tone in both LE's, mild swelling in both lower legs and arrived in wheelchair with KAFO for walking. She has decreased flexibilty in both hips, knees and ankles due to increased tone and lack of consistent stretching. Patient has difficulty performing exercises by herself and has limited help at home to assist with exercises. She will benefit from physical therapy intervention to address decreased flexiblity and strength in order to be able to manage household activites and personal care tasks with less difficulty and improved safety and confidence.    Rehab Potential Fair   Clinical Impairments Affecting Rehab Potential (+) motivated, age, previous good results with therapy intervention   (-) chronic condition/pain, multiple surgeries both LE's, significant hypertonicity/spasticity, COPD   PT Frequency 2x / week   PT Duration 12 weeks   PT Treatment/Interventions Gait training;Manual techniques;Therapeutic exercise;Electrical Stimulation;Moist Heat;Patient/family education;Passive range of motion;Ultrasound;Therapeutic activities;Energy conservation;Neuromuscular re-education;Balance training   PT Next Visit Plan flexiblity and strengthening exercises with assistance, estim./US  for pain control   PT Home Exercise Plan added self stretching for hip flexors and clam for ER in sidelying   Consulted and Agree with Plan of Care Patient      Patient will benefit from skilled therapeutic intervention in order to improve the following deficits and impairments:  Decreased strength, Difficulty  walking, Impaired flexibility, Decreased mobility, Decreased range of motion, Pain, Impaired tone, Decreased endurance  Visit Diagnosis: Muscle weakness (generalized) - Plan: PT plan of care cert/re-cert  Difficulty walking - Plan: PT plan of care cert/re-cert  Pain in left knee - Plan: PT plan of care cert/re-cert  Pain in right knee - Plan: PT plan of care cert/re-cert      G-Codes - [id number]    Functional Assessment Tool Used LEFS, clinical judgment, ROM and strength deficits, pain scale, modified oswestry    Functional Limitation Mobility: Walking and moving around   Mobility: Walking and Moving Around Current Status [id number]) At least 60 percent but less than 80 percent impaired, limited  or restricted   Mobility: Walking and Moving Around Goal Status [id number]) At least 20 percent but less than 40 percent impaired, limited or restricted       Problem List Patient Active Problem List   Diagnosis Date Noted  . Acute renal failure (ARF) ([place]) 02/19/2015    Blythe Stanford, SPT 10/04/2015, 10:24 AM  [facility] [address], [place], [address] Phone: [phone]   Fax:  [phone]  Name: Traci Davis MRN: [id number] Date of Birth: Jun 24, 1970

## 2015-10-05 ENCOUNTER — Ambulatory Visit: Payer: Medicare Other | Admitting: Physical Therapy

## 2015-10-05 ENCOUNTER — Encounter: Payer: Self-pay | Admitting: Physical Therapy

## 2015-10-05 DIAGNOSIS — M6281 Muscle weakness (generalized): Secondary | ICD-10-CM

## 2015-10-05 DIAGNOSIS — R262 Difficulty in walking, not elsewhere classified: Secondary | ICD-10-CM

## 2015-10-05 DIAGNOSIS — M25561 Pain in right knee: Secondary | ICD-10-CM

## 2015-10-05 DIAGNOSIS — M25562 Pain in left knee: Secondary | ICD-10-CM

## 2015-10-06 NOTE — Therapy (Signed)
[facility] [address] [place], [address] Phone: [phone]   Fax:  [phone]  Physical Therapy Treatment  Patient Details  Name: Traci Davis MRN: [id number] Date of Birth: 1970-10-02 Referring Provider: Leretha Dykes MD  Encounter Date: 10/05/2015      PT End of Session - 10/05/15 1739    Visit Number 2   Number of Visits 24   Date for PT Re-Evaluation 12/26/15   Authorization Type 2   Authorization Time Period 10 (G Code)   PT Start Time [id number]   PT Stop Time 1823   PT Time Calculation (min) 50 min   Activity Tolerance Patient tolerated treatment well;Patient limited by pain   Behavior During Therapy [facility] for tasks assessed/performed      Past Medical History  Diagnosis Date  . Anxiety   . Asthma   . Arthritis   . Depression   . Diabetes mellitus without complication ([place])   . GERD (gastroesophageal reflux disease)   . Hypertension   . COPD (chronic obstructive pulmonary disease) [facility])     Past Surgical History  Procedure Laterality Date  . Joint replacement Right     both knees partial replacements dates unknown  . Cesarean section  1995  . Cholecystectomy      There were no vitals filed for this visit.      Subjective Assessment - 10/05/15 1735    Subjective Patient reports shaking of her left leg following previous session of PT. Since last session she has done exercises for ROM of her hips and has not been walking much due to instability and her daughter not being able to feel comfortable with guarding her.    Limitations Walking;Standing;House hold activities   Patient Stated Goals Patient would like to start walking longer distances again.    Currently in Pain? Yes   Pain Score 7    Pain Location Back  and right hip and knees   Pain Orientation Right;Left   Pain Descriptors / Indicators Aching;Nagging;Constant   Pain Type Chronic pain   Pain Onset More than a  month ago      Objective: Observation: patient arrived to therapy required assistance from car to clinic using wheelchair Transfers: W/C to/from treatment table independent with close supervision x 1   Treatment: Exercise; patient performed exercises with guidance, verbal and tactile cues and demonstration of PT: Sitting on treatment table: ball toss with 2# weight 2 x 25 reps alternating with overhead raise with ball x 10 reps and rotation to right and left alternating x 10 reps Sitting: instructed in use of pulley for AAROM/stretching for shoulders forward elevation and scapular rows x 15 reps each Gait: ambulating with rollator, KAFO left LE and contact guard assist x 1 with wheelchair following x 44 feet with fatigue noted  Supine/partially reclined: therapist assisted stretching/ROM both hips including hamstring stretch 3 x 30 seconds, hip rotations 3 sets, hip abduction/adduction x 3 sets, calf stretches x 3 reps, 30 second stretch Supine/partially reclined: leg press with manual resistance given by therapist (mild to moderate) x 10 reps each LE, hip abduction each LE with assist of PT x 10 reps each; AA hip/knee flexion with assist of PT  Patient response to treatment: improved flexibility in trunk, shoulder and LE's with assisted exercises and repetition; fatigue noted with all exercises and walking activities; required assistance to perform all exercises         PT Education -  10/05/15 1845    Education provided Yes   Education Details HEP: added pulleys for seated shoulder ROM and upper body rotation with scapular retraction   Person(s) Educated Patient   Methods Explanation;Demonstration;Verbal cues   Comprehension Verbalized understanding;Returned demonstration;Verbal cues required             PT Long Term Goals - 10/03/15 2011    PT LONG TERM GOAL #1   Title Patient will demonstrate ability to walk 30 ft with minor fatigue by 12/26/15 to demonstrate functional  improvement in ability to ambulate   Baseline Ambulates 8 ft with increased fatigue   Status New   PT LONG TERM GOAL #2   Title Patient will improve ROM in both hips with SLR to 60 degrees by 12/26/15 for improved mobility with transfers and walking with improved gait pattern.   Baseline decreased Hamstring length with SLR to 40 degrees bilaterally (increased tone)   Status New   PT LONG TERM GOAL #3   Title Patient will demonstrate a LEFS score of 25/80 by 12/26/15 to show significant improvement and LE functions indicating improved ability to perform standing activties   Baseline LEFS: 11/80 (Severe self perceived dysfunction)   Status New   PT LONG TERM GOAL #4   Title Patient will demonstrate a MODI score of 40% by 12/26/15 to demonstrate singificant improvement in lumbar function and ability to ambulate.    Baseline MODI: 58% (severe-moderate self perceived dysfucntion)   Status New   PT LONG TERM GOAL #5   Title Patient will be independent with self management of select exercises and control of tone with home program and family assistance as needed by 12/26/2015   Baseline Patient is limited in knowledge and abiltiy to perform exercises independently with minimal family support or outside assistance   Status New               Plan - 10/05/15 1845    Clinical Impression Statement Patient demonstrates improved flexibiltiy in both hips/LE's with treatment. She was able to walk 44 feet using rollator for support and contact guard for safety with fatigue limiting further distance. She is limited by decreased strength in LE's and trunk, decreased endurance and flexibility. She is beginning to exercise at home for ROM/stretching hips/LE's and should continue to progress towards goals with additional physical therapy intervention.    Rehab Potential Fair   PT Frequency 2x / week   PT Duration 12 weeks   PT Treatment/Interventions Gait training;Manual techniques;Therapeutic  exercise;Electrical Stimulation;Moist Heat;Patient/family education;Passive range of motion;Ultrasound;Therapeutic activities;Energy conservation;Neuromuscular re-education;Balance training   PT Next Visit Plan flexiblity and strengthening exercises with assistance, estim./modalities as needed for pain control   PT Home Exercise Plan added pulley for ROM/core exercises, continue with stretching exercises for ER of hip and hamstring stretches       Patient will benefit from skilled therapeutic intervention in order to improve the following deficits and impairments:  Decreased strength, Difficulty walking, Impaired flexibility, Decreased mobility, Decreased range of motion, Pain, Impaired tone, Decreased endurance  Visit Diagnosis: Muscle weakness (generalized)  Difficulty walking  Pain in left knee  Pain in right knee     Problem List Patient Active Problem List   Diagnosis Date Noted  . Acute renal failure (ARF) ([place]) 02/19/2015    Jomarie Longs PT 10/06/2015, 10:56 AM  [facility] [address] [place], [address] Phone: [phone]   Fax:  [phone]  Name: Traci  SHAKEVA Davis MRN: [id number] Date of Birth: 1970/09/06

## 2015-10-11 ENCOUNTER — Ambulatory Visit: Payer: Medicare Other | Admitting: Physical Therapy

## 2015-10-11 ENCOUNTER — Encounter: Payer: Self-pay | Admitting: Physical Therapy

## 2015-10-11 DIAGNOSIS — M6281 Muscle weakness (generalized): Secondary | ICD-10-CM

## 2015-10-11 DIAGNOSIS — M25562 Pain in left knee: Secondary | ICD-10-CM

## 2015-10-11 DIAGNOSIS — M25561 Pain in right knee: Secondary | ICD-10-CM

## 2015-10-11 DIAGNOSIS — R262 Difficulty in walking, not elsewhere classified: Secondary | ICD-10-CM

## 2015-10-11 NOTE — Therapy (Signed)
[facility] [address] [place], [address] Phone: [phone]   Fax:  [phone]  Physical Therapy Treatment  Patient Details  Name: Traci Davis MRN: [id number] Date of Birth: [birth year] Referring Provider: Leretha Dykes MD  Encounter Date: 10/11/2015      PT End of Session - 10/11/15 1724    Visit Number 3   Number of Visits 24   Date for PT Re-Evaluation 12/26/15   Authorization Type 3   Authorization Time Period 10 (G Code)   PT Start Time 1618   PT Stop Time 1700   PT Time Calculation (min) 42 min   Equipment Utilized During Treatment Other (comment)  KAFO   Activity Tolerance Patient tolerated treatment well;Patient limited by pain   Behavior During Therapy [facility] for tasks assessed/performed      Past Medical History  Diagnosis Date  . Anxiety   . Asthma   . Arthritis   . Depression   . Diabetes mellitus without complication ([place])   . GERD (gastroesophageal reflux disease)   . Hypertension   . COPD (chronic obstructive pulmonary disease) [facility])     Past Surgical History  Procedure Laterality Date  . Joint replacement Right     both knees partial replacements dates unknown  . Cesarean section  1995  . Cholecystectomy      There were no vitals filed for this visit.      Subjective Assessment - 10/11/15 1622    Subjective Patient reports having pain in her legs the other day and had increased pain in her knees and hips and back and was able to control the pain with heat and pain medicaiton. She also reports she had difficulty with getting up to transfer to toilet and almost fell due to weakness.    Patient is accompained by: Family member  daughter   Limitations Walking;Standing;House hold activities   Currently in Pain? Yes   Pain Score 3    Pain Location Other (Comment)  hips and arm   Pain Descriptors / Indicators Aching   Pain Type Chronic pain   Pain Onset More  than a month ago   Pain Frequency Intermittent      Objective: Observation: patient arrived to therapy required assistance from car to clinic using wheelchair Transfers: W/C to/from treatment table independent with close supervision x 1   Treatment: Exercise; patient performed exercises with guidance, verbal and tactile cues and demonstration of PT: Sitting on treatment table: ball toss with 2# weight 2 x 25 reps alternating with overhead raise with ball x 10 reps and rotation to right and left alternating x 10 reps reaching for cones Sitting:  pulley for AAROM/stretching for shoulders forward elevation and scapular rows x 15 reps each Supine/partially reclined: therapist assisted stretching/ROM both hips including hamstring stretch 3 x 30 seconds, hip rotations 3 sets, hip abduction/adduction x 3 sets, calf stretches x 3 reps, 30 second stretch Supine/partially reclined: leg press with manual resistance given by therapist (mild to moderate) x 10 reps each LE, hip abduction each LE with assist of PT x 10 reps each; AA hip/knee flexion with assist of PT  Patient response to treatment: improved flexibility in trunk with stiffness and soreness reported throughout, shoulder and LE's exercises required assistance and repetition; fatigue noted with all exercises          PT Education - 10/11/15 1705    Education provided Yes   Education Details HEP;  continue with ROM and use of pulleys, instructed daughter in use of pulleys   Person(s) Educated Patient;Child(ren)   Methods Explanation;Demonstration;Verbal cues   Comprehension Verbalized understanding;Returned demonstration;Verbal cues required             PT Long Term Goals - 10/03/15 2011    PT LONG TERM GOAL #1   Title Patient will demonstrate ability to walk 30 ft with minor fatigue by 12/26/15 to demonstrate functional improvement in ability to ambulate   Baseline Ambulates 8 ft with increased fatigue   Status New   PT LONG  TERM GOAL #2   Title Patient will improve ROM in both hips with SLR to 60 degrees by 12/26/15 for improved mobility with transfers and walking with improved gait pattern.   Baseline decreased Hamstring length with SLR to 40 degrees bilaterally (increased tone)   Status New   PT LONG TERM GOAL #3   Title Patient will demonstrate a LEFS score of 25/80 by 12/26/15 to show significant improvement and LE functions indicating improved ability to perform standing activties   Baseline LEFS: 11/80 (Severe self perceived dysfunction)   Status New   PT LONG TERM GOAL #4   Title Patient will demonstrate a MODI score of 40% by 12/26/15 to demonstrate singificant improvement in lumbar function and ability to ambulate.    Baseline MODI: 58% (severe-moderate self perceived dysfucntion)   Status New   PT LONG TERM GOAL #5   Title Patient will be independent with self management of select exercises and control of tone with home program and family assistance as needed by 12/26/2015   Baseline Patient is limited in knowledge and abiltiy to perform exercises independently with minimal family support or outside assistance   Status New               Plan - 10/11/15 1710    Clinical Impression Statement Patient tired today and therefore no walking exercises were attempted. Patient demonstrated improved ablity to perform exercises with moderatel assistance of therapist. She continues with weakness, increased tone, and stiffness in LE's that limit ability to perform standing and walking activities. She requires additional physical therapy intervention to address limitaitons.    Rehab Potential Fair   PT Frequency 2x / week   PT Duration 12 weeks   PT Treatment/Interventions Gait training;Manual techniques;Therapeutic exercise;Electrical Stimulation;Moist Heat;Patient/family education;Passive range of motion;Ultrasound;Therapeutic activities;Energy conservation;Neuromuscular re-education;Balance training   PT Next  Visit Plan flexiblity and strengthening exercises with assistance, estim./modalities as needed for pain control   PT Home Exercise Plan pulley for ROM/core exercises, continue with stretching exercises for ER of hip and hamstring stretches       Patient will benefit from skilled therapeutic intervention in order to improve the following deficits and impairments:  Decreased strength, Difficulty walking, Impaired flexibility, Decreased mobility, Decreased range of motion, Pain, Impaired tone, Decreased endurance  Visit Diagnosis: Muscle weakness (generalized)  Difficulty walking  Pain in left knee  Pain in right knee     Problem List Patient Active Problem List   Diagnosis Date Noted  . Acute renal failure (ARF) ([name]) 02/19/2015    Jomarie Longs PT 10/11/2015, 10:22 PM  [facility] [address], [place], [address] Phone: [phone]   Fax:  [phone]  Name: Traci Davis MRN: [id number] Date of Birth: 1970-11-29

## 2015-10-13 ENCOUNTER — Ambulatory Visit: Payer: Medicare Other | Admitting: Physical Therapy

## 2015-10-18 ENCOUNTER — Encounter: Payer: Self-pay | Admitting: Physical Therapy

## 2015-10-18 ENCOUNTER — Ambulatory Visit: Payer: Medicare Other | Admitting: Physical Therapy

## 2015-10-18 DIAGNOSIS — M6281 Muscle weakness (generalized): Secondary | ICD-10-CM

## 2015-10-18 DIAGNOSIS — M25561 Pain in right knee: Secondary | ICD-10-CM

## 2015-10-18 DIAGNOSIS — M25562 Pain in left knee: Secondary | ICD-10-CM

## 2015-10-18 DIAGNOSIS — R262 Difficulty in walking, not elsewhere classified: Secondary | ICD-10-CM

## 2015-10-19 NOTE — Therapy (Signed)
[facility] [address], [place], [address] Phone: [phone]   Fax:  [phone]  Physical Therapy Treatment  Patient Details  Name: Traci Davis MRN: [id number] Date of Birth: Sep 24, 1970 Referring Provider: Leretha Dykes MD  Encounter Date: 10/18/2015      PT End of Session - 10/18/15 1758    Visit Number 4   Number of Visits 24   Date for PT Re-Evaluation 12/26/15   Authorization Type 4   Authorization Time Period 10 (G Code)   PT Start Time 1750   PT Stop Time 1836   PT Time Calculation (min) 46 min   Activity Tolerance Patient tolerated treatment well;Patient limited by pain   Behavior During Therapy [facility] for tasks assessed/performed      Past Medical History  Diagnosis Date  . Anxiety   . Asthma   . Arthritis   . Depression   . Diabetes mellitus without complication ([place])   . GERD (gastroesophageal reflux disease)   . Hypertension   . COPD (chronic obstructive pulmonary disease) [facility])     Past Surgical History  Procedure Laterality Date  . Joint replacement Right     both knees partial replacements dates unknown  . Cesarean section  1995  . Cholecystectomy      There were no vitals filed for this visit.      Subjective Assessment - 10/18/15 1754    Subjective Patient reports she has been dealing with breathing difficulty the past week and is now feeling some better. She has not been standing or moving around much in the home. she has been visiting outside the home to family and pool and she was able to exercise some; she went one time to the pool and was tired afterwards (was in pool x 2 hours)   Limitations Walking;Standing;House hold activities   Patient Stated Goals Patient would like to start walking longer distances again.    Currently in Pain? Yes   Pain Score 3    Pain Location Knee   Pain Orientation Right;Left   Pain Descriptors / Indicators Aching   Pain  Type Chronic pain   Pain Onset More than a month ago   Pain Frequency Intermittent         Objective: Observation: patient arrived to therapy required assistance from car to clinic using wheelchair Transfers: W/C to/from treatment table independent with close supervision x 1   Treatment: Exercise; patient performed exercises with guidance, verbal and tactile cues and demonstration of PT: Sitting on treatment table: ball toss 2 x 25 reps alternating with overhead raise with ball x 10 reps  Sitting: rhythmic stabilization flexion, extension, rotations x 10 reps each Supine/partially reclined: therapist assisted stretching/ROM both hips including hamstring stretch 3 x 30 seconds, hip rotations 3 sets, hip abduction/adduction x 3 sets, calf stretches x 3 reps, 30 second stretch Supine/partially reclined: leg press with manual resistance given by therapist (mild to moderate) x 10 reps each LE, hip abduction each LE with assist of PT x 10 reps each; AA hip/knee flexion with assist of PT Ambulating with rolling walker (front wheels) with standby assist x 1  and one with wheelchair following for safety 2 x 64 feet with rest between walks with deep breathing performed x 10 reps: SPO2 97 -99% and HR 104 to 87 throughout walking exercises and deep breathing  Patient response to treatment: improved flexibility in trunk with stiffness and soreness reported throughout, shoulder  and LE's exercises required assistance and repetition; fatigue noted with all exercises; ambulated 20 more feet than previous week             PT Long Term Goals - 10/03/15 2011    PT LONG TERM GOAL #1   Title Patient will demonstrate ability to walk 30 ft with minor fatigue by 12/26/15 to demonstrate functional improvement in ability to ambulate   Baseline Ambulates 8 ft with increased fatigue   Status New   PT LONG TERM GOAL #2   Title Patient will improve ROM in both hips with SLR to 60 degrees by 12/26/15 for  improved mobility with transfers and walking with improved gait pattern.   Baseline decreased Hamstring length with SLR to 40 degrees bilaterally (increased tone)   Status New   PT LONG TERM GOAL #3   Title Patient will demonstrate a LEFS score of 25/80 by 12/26/15 to show significant improvement and LE functions indicating improved ability to perform standing activties   Baseline LEFS: 11/80 (Severe self perceived dysfunction)   Status New   PT LONG TERM GOAL #4   Title Patient will demonstrate a MODI score of 40% by 12/26/15 to demonstrate singificant improvement in lumbar function and ability to ambulate.    Baseline MODI: 58% (severe-moderate self perceived dysfucntion)   Status New   PT LONG TERM GOAL #5   Title Patient will be independent with self management of select exercises and control of tone with home program and family assistance as needed by 12/26/2015   Baseline Patient is limited in knowledge and abiltiy to perform exercises independently with minimal family support or outside assistance   Status New               Plan - 10/18/15 1846    Clinical Impression Statement Patient able to tolerate therapy sessoin with less fatigue and improved ability to walk today increased distances. She continues with decreased flexibility in her LE's and decreased strength in core, LE's  that will benefit from additional skillled physical therpay intervention to imrpove ability to stand and walk for household and comminuty ambulation/tasks. Recommended to patient and daughter to continue with exercises and walking in pool to assist with balance, strength and endurance.   Rehab Potential Fair   PT Frequency 2x / week   PT Duration 12 weeks   PT Treatment/Interventions Gait training;Manual techniques;Therapeutic exercise;Electrical Stimulation;Moist Heat;Patient/family education;Passive range of motion;Ultrasound;Therapeutic activities;Energy conservation;Neuromuscular re-education;Balance  training   PT Next Visit Plan flexiblity and strengthening exercises with assistance, estim./modalities as needed for pain control   PT Home Exercise Plan pulley for ROM/core exercises, continue with stretching exercises for ER of hip and hamstring stretches       Patient will benefit from skilled therapeutic intervention in order to improve the following deficits and impairments:  Decreased strength, Difficulty walking, Impaired flexibility, Decreased mobility, Decreased range of motion, Pain, Impaired tone, Decreased endurance  Visit Diagnosis: Muscle weakness (generalized)  Difficulty walking  Pain in left knee  Pain in right knee     Problem List Patient Active Problem List   Diagnosis Date Noted  . Acute renal failure (ARF) ([place]) 02/19/2015    Jomarie Longs PT 10/19/2015, 5:22 PM  [facility] [address], [place], [address] Phone: [phone]   Fax:  [phone]  Name: Traci Davis MRN: [id number] Date of Birth: 1970/07/20

## 2015-10-20 ENCOUNTER — Ambulatory Visit: Payer: Medicare Other | Admitting: Physical Therapy

## 2015-10-25 ENCOUNTER — Ambulatory Visit: Payer: Medicare Other | Admitting: Physical Therapy

## 2015-10-25 ENCOUNTER — Encounter: Payer: Self-pay | Admitting: Physical Therapy

## 2015-10-25 ENCOUNTER — Encounter: Payer: Medicare Other | Admitting: Physical Therapy

## 2015-10-25 DIAGNOSIS — M6281 Muscle weakness (generalized): Secondary | ICD-10-CM

## 2015-10-25 DIAGNOSIS — R262 Difficulty in walking, not elsewhere classified: Secondary | ICD-10-CM

## 2015-10-25 NOTE — Therapy (Signed)
[facility] [address] [place], [address] Phone: [phone]   Fax:  [phone]  Physical Therapy Treatment  Patient Details  Name: Traci Davis MRN: [id number] Date of Birth: Nov 30, 1970 Referring Provider: Leretha Dykes MD  Encounter Date: 10/25/2015      PT End of Session - 10/25/15 1716    Visit Number 5   Number of Visits 24   Date for PT Re-Evaluation 12/26/15   Authorization Type 5   Authorization Time Period 10 (G Code)   PT Start Time 1710   PT Stop Time 1752   PT Time Calculation (min) 42 min   Activity Tolerance Patient tolerated treatment well;Patient limited by fatigue   Behavior During Therapy [facility] for tasks assessed/performed      Past Medical History  Diagnosis Date  . Anxiety   . Asthma   . Arthritis   . Depression   . Diabetes mellitus without complication ([place])   . GERD (gastroesophageal reflux disease)   . Hypertension   . COPD (chronic obstructive pulmonary disease) [facility])     Past Surgical History  Procedure Laterality Date  . Joint replacement Right     both knees partial replacements dates unknown  . Cesarean section  1995  . Cholecystectomy      There were no vitals filed for this visit.      Subjective Assessment - 10/25/15 1712    Subjective Patient reports she has been dealing with breathing difficulty the past week and is now feeling some better. Patient reports she was able to walk without walker in her for the first time in a long time and with KAFO in place left LE.  She has not been to the pool since last visit.    Limitations Walking;Standing;House hold activities   Patient Stated Goals Patient would like to start walking longer distances again.    Currently in Pain? No/denies        Objective: Observation: patient arrived to therapy and ambulated into clinic independently using rollator for support and KAFO left LE for  support Transfers: W/C to/from treatment table independent with close supervision x 1; W/C to car with standby assist x 1 and assistance to put rollator into car   Treatment: Exercise; patient performed exercises with guidance, verbal and tactile cues and demonstration of PT: Sitting: rhythmic stabilization flexion, extension, rotations x 10 reps each Supine/partially reclined: therapist assisted stretching/ROM both hips including hamstring stretch 3 x 30 seconds, hip rotations 3 sets, hip abduction/adduction x 3 sets, calf stretches x 3 reps, 30 second stretch Supine/partially reclined: leg press with manual resistance given by therapist (mild to moderate) x 10 reps each LE, hip abduction/adduction each LE with manual resistance given by PT 2 x 10 each;  Ambulating with rollator with standby assist x 1 from waiting area to treatment room: SPO2 97 -99% and HR 104 to 83 throughout walking exercises and with diaphragmatic breathing  Patient response to treatment: improved flexibility in trunk with decreased stiffness,  exercises required assistance and repetition; fatigue noted with all exercises; ambulated with improved endurance however had increased wheezing which limited walking and UE exercises today.    Patient education: Instructed to perform AROM exercises for hip abduction, adduction x 10 - 20 reps daily in bed and continue to ambulate as tolerated to build endurance; patient verbalized understanding       PT Long Term Goals - 10/03/15 2011    PT LONG  TERM GOAL #1   Title Patient will demonstrate ability to walk 30 ft with minor fatigue by 12/26/15 to demonstrate functional improvement in ability to ambulate   Baseline Ambulates 8 ft with increased fatigue   Status New   PT LONG TERM GOAL #2   Title Patient will improve ROM in both hips with SLR to 60 degrees by 12/26/15 for improved mobility with transfers and walking with improved gait pattern.   Baseline decreased Hamstring length  with SLR to 40 degrees bilaterally (increased tone)   Status New   PT LONG TERM GOAL #3   Title Patient will demonstrate a LEFS score of 25/80 by 12/26/15 to show significant improvement and LE functions indicating improved ability to perform standing activties   Baseline LEFS: 11/80 (Severe self perceived dysfunction)   Status New   PT LONG TERM GOAL #4   Title Patient will demonstrate a MODI score of 40% by 12/26/15 to demonstrate singificant improvement in lumbar function and ability to ambulate.    Baseline MODI: 58% (severe-moderate self perceived dysfucntion)   Status New   PT LONG TERM GOAL #5   Title Patient will be independent with self management of select exercises and control of tone with home program and family assistance as needed by 12/26/2015   Baseline Patient is limited in knowledge and abiltiy to perform exercises independently with minimal family support or outside assistance   Status New               Plan - 10/25/15 1716    Clinical Impression Statement Patient able to perform increased walking activity at home with and without walker. She has wheezing today which limited exercise tolerance during session today. She continues with decreased flexibility in her LE's and decreased strength in her core that will benefit from additional skilled physical therapy intervention to improve ability to stand and walk for household and community ambulation/tasks.    Rehab Potential Fair   PT Frequency 2x / week   PT Duration 12 weeks   PT Next Visit Plan flexiblity and strengthening exercises with assistance, estim./modalities as needed for pain control   PT Home Exercise Plan pulley for ROM/core exercises, continue with stretching exercises for ER of hip and hamstring stretches       Patient will benefit from skilled therapeutic intervention in order to improve the following deficits and impairments:  Decreased strength, Difficulty walking, Impaired flexibility, Decreased  mobility, Decreased range of motion, Pain, Impaired tone, Decreased endurance  Visit Diagnosis: Muscle weakness (generalized)  Difficulty walking     Problem List Patient Active Problem List   Diagnosis Date Noted  . Acute renal failure (ARF) ([place]) 02/19/2015    Jomarie Longs PT 10/25/2015, 6:53 PM  [place] [facility] [address], [place], [address] Phone: [phone]   Fax:  [phone]  Name: FALECIA PERRETT MRN: [id number] Date of Birth: Apr 01, 1971

## 2015-10-27 ENCOUNTER — Ambulatory Visit: Payer: Medicare Other | Admitting: Physical Therapy

## 2015-10-27 ENCOUNTER — Encounter: Payer: Medicare Other | Admitting: Physical Therapy

## 2015-10-31 ENCOUNTER — Encounter: Payer: Medicare Other | Admitting: Physical Therapy

## 2015-10-31 ENCOUNTER — Ambulatory Visit: Payer: Medicare Other | Attending: Physical Medicine and Rehabilitation | Admitting: Physical Therapy

## 2015-10-31 ENCOUNTER — Encounter: Payer: Self-pay | Admitting: Physical Therapy

## 2015-10-31 DIAGNOSIS — M25562 Pain in left knee: Secondary | ICD-10-CM | POA: Diagnosis present

## 2015-10-31 DIAGNOSIS — R262 Difficulty in walking, not elsewhere classified: Secondary | ICD-10-CM | POA: Insufficient documentation

## 2015-10-31 DIAGNOSIS — M25561 Pain in right knee: Secondary | ICD-10-CM | POA: Insufficient documentation

## 2015-10-31 DIAGNOSIS — M6281 Muscle weakness (generalized): Secondary | ICD-10-CM | POA: Diagnosis present

## 2015-10-31 NOTE — Therapy (Signed)
[place] [address], [place], [address] Phone: [phone]   Fax:  [phone]  Physical Therapy Treatment  Patient Details  Name: CATHYRN PINERA MRN: [id number] Date of Birth: [birth year] Referring Provider: Leretha Dykes MD  Encounter Date: 10/31/2015      PT End of Session - 10/31/15 1852    Visit Number 6   Number of Visits 24   Date for PT Re-Evaluation 12/26/15   Authorization Type 6   Authorization Time Period 10 (G Code)   PT Start Time [id number]   PT Stop Time 1833   PT Time Calculation (min) 48 min   Equipment Utilized During Treatment Other (comment)  KAFO left LE   Activity Tolerance Patient tolerated treatment well   Behavior During Therapy [facility] for tasks assessed/performed      Past Medical History  Diagnosis Date  . Anxiety   . Asthma   . Arthritis   . Depression   . Diabetes mellitus without complication ([place])   . GERD (gastroesophageal reflux disease)   . Hypertension   . COPD (chronic obstructive pulmonary disease) [facility])     Past Surgical History  Procedure Laterality Date  . Joint replacement Right     both knees partial replacements dates unknown  . Cesarean section  1995  . Cholecystectomy      There were no vitals filed for this visit.      Subjective Assessment - 10/31/15 1747    Subjective Patient reports she is going to the MD for re assessment of hips and reason for her not being able to raise her legs at her hips and externally rotate hips to put shoes on.    Limitations Walking;Standing;House hold activities   Patient Stated Goals Patient would like to start walking longer distances again.    Currently in Pain? No/denies      Objective: Observation: Patient required assistance to enter clinic with wheelchair today and KAFO left LE for support Transfers: W/C to/from treatment table independent with close supervision x 1; W/C to car with standby  assist x 1 Tone: increased extensor tone both LE's and left UE>right UE   Treatment: Therapeutic Exercise; patient performed exercises with guidance, verbal and tactile cues and demonstration of PT: Sitting: rhythmic stabilization flexion, extension, rotations x 10 reps each Ball toss with 2# ball 2 x 20 reps alternating with other exercises scapular rows with green resistive band x 15 reps Bilateral flexion with 2# ball overhead x 10 reps Left hip ER with hip flexion: PROM/AAROM with hold at end range x 10 reps Supine/partially reclined:  therapist assisted stretching/ROM both hips including hamstring stretch 3 x 30 seconds, hip rotations 3 sets, hip abduction/adduction x 3 sets, calf stretches x 3 reps, 30 second stretch leg press with manual resistance given by therapist using ball for support of feet (mild to moderate) x 10 reps, hip abduction/adduction each LE with manual resistance given by PT 2 x 10 each;  Ball under LE's for hip/knee flexion x 10, LTR x 10 reps with assistance to keep LE's on ball   Patient response to treatment: improved flexibility in trunk with decreased stiffness, exercises required assistance and repetition; fatigue noted with all exercises; mild wheezing noted with UE exercises          PT Education - 10/31/15 1850    Education provided Yes   Education Details HEP and discussed role of increased tone has on ability to  perform dressing/putting on socks and shoes due to tone changes affecting LE's; instructed to perform ball rolling under LE's and LTR and ER AROM in supine lying to assist with dressing limitations   Person(s) Educated Patient   Methods Explanation;Demonstration;Verbal cues   Comprehension Verbalized understanding;Returned demonstration;Verbal cues required             PT Long Term Goals - 10/03/15 2011    PT LONG TERM GOAL #1   Title Patient will demonstrate ability to walk 30 ft with minor fatigue by 12/26/15 to demonstrate  functional improvement in ability to ambulate   Baseline Ambulates 8 ft with increased fatigue   Status New   PT LONG TERM GOAL #2   Title Patient will improve ROM in both hips with SLR to 60 degrees by 12/26/15 for improved mobility with transfers and walking with improved gait pattern.   Baseline decreased Hamstring length with SLR to 40 degrees bilaterally (increased tone)   Status New   PT LONG TERM GOAL #3   Title Patient will demonstrate a LEFS score of 25/80 by 12/26/15 to show significant improvement and LE functions indicating improved ability to perform standing activties   Baseline LEFS: 11/80 (Severe self perceived dysfunction)   Status New   PT LONG TERM GOAL #4   Title Patient will demonstrate a MODI score of 40% by 12/26/15 to demonstrate singificant improvement in lumbar function and ability to ambulate.    Baseline MODI: 58% (severe-moderate self perceived dysfucntion)   Status New   PT LONG TERM GOAL #5   Title Patient will be independent with self management of select exercises and control of tone with home program and family assistance as needed by 12/26/2015   Baseline Patient is limited in knowledge and abiltiy to perform exercises independently with minimal family support or outside assistance   Status New               Plan - 10/31/15 1854    Clinical Impression Statement Patient demonstrated improved hip rotations and decreased tone in both LE's following treatment today. She verbalized good understanding of home program and exercises to perform to achieve improved ability to don socks and shoes with less difficulty. She continues with increased tone, decreased strength and difficulty with ADL's at home and in community and will benefit from additional physical therapy intervention to address limitations.    Rehab Potential Fair   PT Frequency 2x / week   PT Duration 12 weeks   PT Treatment/Interventions Gait training;Manual techniques;Therapeutic  exercise;Electrical Stimulation;Moist Heat;Patient/family education;Passive range of motion;Ultrasound;Therapeutic activities;Energy conservation;Neuromuscular re-education;Balance training   PT Next Visit Plan flexiblity and strengthening exercises with assistance, estim./modalities as needed for pain control   PT Home Exercise Plan pulley for ROM/core exercises, continue with stretching exercises for ER of hip and hamstring stretches       Patient will benefit from skilled therapeutic intervention in order to improve the following deficits and impairments:  Decreased strength, Difficulty walking, Impaired flexibility, Decreased mobility, Decreased range of motion, Pain, Impaired tone, Decreased endurance  Visit Diagnosis: Muscle weakness (generalized)  Difficulty walking     Problem List Patient Active Problem List   Diagnosis Date Noted  . Acute renal failure (ARF) ([place]) 02/19/2015    Jomarie Longs PT 10/31/2015, 6:57 PM  [place] [facility] [address], [place], [address] Phone: [phone]   Fax:  [phone]  Name: KAHLYNN OLDAKER MRN: [id number] Date of Birth:  07/09/1970     

## 2015-11-03 ENCOUNTER — Encounter: Payer: Self-pay | Admitting: Physical Therapy

## 2015-11-03 ENCOUNTER — Encounter: Payer: Medicare Other | Admitting: Physical Therapy

## 2015-11-03 ENCOUNTER — Ambulatory Visit: Payer: Medicare Other | Admitting: Physical Therapy

## 2015-11-03 DIAGNOSIS — M6281 Muscle weakness (generalized): Secondary | ICD-10-CM | POA: Diagnosis not present

## 2015-11-03 DIAGNOSIS — R262 Difficulty in walking, not elsewhere classified: Secondary | ICD-10-CM

## 2015-11-03 NOTE — Therapy (Signed)
[place] [address] [place], [address] Phone: [phone]   Fax:  [phone]  Physical Therapy Treatment  Patient Details  Name: Traci Davis MRN: [id number] Date of Birth: 1970/12/30 Referring Provider: Leretha Dykes MD  Encounter Date: 11/03/2015      PT End of Session - 11/03/15 2214    Visit Number 7   Number of Visits 24   Date for PT Re-Evaluation 12/26/15   Authorization Type 7   Authorization Time Period 10 (G Code)   PT Start Time 1720   PT Stop Time 1810   PT Time Calculation (min) 50 min   Activity Tolerance Patient tolerated treatment well   Behavior During Therapy [facility] for tasks assessed/performed      Past Medical History  Diagnosis Date  . Anxiety   . Asthma   . Arthritis   . Depression   . Diabetes mellitus without complication ([place])   . GERD (gastroesophageal reflux disease)   . Hypertension   . COPD (chronic obstructive pulmonary disease) [facility])     Past Surgical History  Procedure Laterality Date  . Joint replacement Right     both knees partial replacements dates unknown  . Cesarean section  1995  . Cholecystectomy      There were no vitals filed for this visit.      Subjective Assessment - 11/03/15 1721    Subjective Patient reports her glucose levels went down to 60's and she was getting dizzy. She had to eat to balance her sympotms. She is still walking at home more frequently and feeling stronger and more flexible with therapy sessions.    Limitations Walking;Standing;House hold activities   Patient Stated Goals Patient would like to start walking longer distances again.    Currently in Pain? No/denies      Objective: Observation: Patient required assistance to enter clinic with wheelchair today and KAFO left LE for support Transfers: W/C to/from treatment table independent with close supervision x 1; W/C to car with standby assist x 1 Tone:  increased extensor tone both LE's and left UE>right UE   Treatment: Therapeutic Exercise; patient performed exercises with guidance, verbal and tactile cues and demonstration of PT: Sitting: Pulley assisted by therapist for shoulder ROM and scapular retraction/rotation of thoracic spine rhythmic stabilization flexion, extension, rotations x 10 reps each Ball toss with 2# ball 2 x 20 reps alternating with other exercises scapular rows with green resistive band x 15 reps Bilateral flexion with 2# ball overhead x 10 reps Left hip ER with hip flexion: PROM/AAROM with hold at end range x 10 reps Press heel into balance stone x 15 reps each LE Supine/partially reclined:  therapist assisted stretching/ROM both hips including hamstring stretch 3 x 30 seconds, hip rotations 3 sets, hip abduction/adduction x 3 sets, calf stretches x 3 reps, 30 second stretch leg press with manual resistance given by therapist using ball for support of feet (mild to moderate) x 10 reps, hip abduction/adduction each LE with manual resistance given by PT 2 x 10 each;     Patient response to treatment: improved flexibility in trunk with decreased stiffness, exercises required assistance and repetition; fatigue noted with all exercises; mild wheezing noted with UE exercises           PT Education - 11/03/15 1733    Education provided Yes   Education Details HEP and discussed role of glucose levels and exercise and the need to  eat foods that maintain blood glucose levels    Person(s) Educated Patient   Methods Explanation   Comprehension Verbalized understanding;Returned demonstration;Verbal cues required             PT Long Term Goals - 10/03/15 2011    PT LONG TERM GOAL #1   Title Patient will demonstrate ability to walk 30 ft with minor fatigue by 12/26/15 to demonstrate functional improvement in ability to ambulate   Baseline Ambulates 8 ft with increased fatigue   Status New   PT LONG TERM  GOAL #2   Title Patient will improve ROM in both hips with SLR to 60 degrees by 12/26/15 for improved mobility with transfers and walking with improved gait pattern.   Baseline decreased Hamstring length with SLR to 40 degrees bilaterally (increased tone)   Status New   PT LONG TERM GOAL #3   Title Patient will demonstrate a LEFS score of 25/80 by 12/26/15 to show significant improvement and LE functions indicating improved ability to perform standing activties   Baseline LEFS: 11/80 (Severe self perceived dysfunction)   Status New   PT LONG TERM GOAL #4   Title Patient will demonstrate a MODI score of 40% by 12/26/15 to demonstrate singificant improvement in lumbar function and ability to ambulate.    Baseline MODI: 58% (severe-moderate self perceived dysfucntion)   Status New   PT LONG TERM GOAL #5   Title Patient will be independent with self management of select exercises and control of tone with home program and family assistance as needed by 12/26/2015   Baseline Patient is limited in knowledge and abiltiy to perform exercises independently with minimal family support or outside assistance   Status New               Plan - 11/03/15 1830    Clinical Impression Statement Patient demonstrated improved hip rotations and decreased tone in both LE's  Following treatment. She is improving with functional carry over for ADL's and standing/walking activities for home. She requires assistance to complete all exercises and will require additional physical therapy intervention to improve function with household and personal tasks.    Rehab Potential Fair   PT Frequency 2x / week   PT Duration 12 weeks   PT Treatment/Interventions Gait training;Manual techniques;Therapeutic exercise;Electrical Stimulation;Moist Heat;Patient/family education;Passive range of motion;Ultrasound;Therapeutic activities;Energy conservation;Neuromuscular re-education;Balance training   PT Next Visit Plan flexiblity and  strengthening exercises with assistance, estim./modalities as needed for pain control   PT Home Exercise Plan pulley for ROM/core exercises, continue with stretching exercises for ER of hip and hamstring stretches       Patient will benefit from skilled therapeutic intervention in order to improve the following deficits and impairments:  Decreased strength, Difficulty walking, Impaired flexibility, Decreased mobility, Decreased range of motion, Pain, Impaired tone, Decreased endurance  Visit Diagnosis: Muscle weakness (generalized)  Difficulty walking     Problem List Patient Active Problem List   Diagnosis Date Noted  . Acute renal failure (ARF) ([place]) 02/19/2015    Jomarie Longs PT 11/03/2015, 10:17 PM  [place] [facility] [address] [place], [address] Phone: [phone]   Fax:  [phone]  Name: JAHANNA SQUIRES MRN: [id number] Date of Birth: 03/22/1971

## 2015-11-07 ENCOUNTER — Ambulatory Visit: Payer: Medicare Other | Admitting: Physical Therapy

## 2015-11-08 ENCOUNTER — Ambulatory Visit: Payer: Medicare Other | Admitting: Physical Therapy

## 2015-11-09 ENCOUNTER — Encounter: Payer: Self-pay | Admitting: Physical Therapy

## 2015-11-09 ENCOUNTER — Ambulatory Visit: Payer: Medicare Other | Admitting: Physical Therapy

## 2015-11-09 DIAGNOSIS — M25562 Pain in left knee: Secondary | ICD-10-CM

## 2015-11-09 DIAGNOSIS — M6281 Muscle weakness (generalized): Secondary | ICD-10-CM

## 2015-11-09 DIAGNOSIS — M25561 Pain in right knee: Secondary | ICD-10-CM

## 2015-11-09 DIAGNOSIS — R262 Difficulty in walking, not elsewhere classified: Secondary | ICD-10-CM

## 2015-11-10 NOTE — Therapy (Signed)
[facility] [address] [place], [address] Phone: [phone]   Fax:  [phone]  Physical Therapy Treatment  Patient Details  Name: Traci Davis MRN: [id number] Date of Birth: [birth year] Referring Provider: Leretha Dykes MD  Encounter Date: 11/09/2015      PT End of Session - 11/09/15 1824    Visit Number 8   Number of Visits 24   Date for PT Re-Evaluation 12/26/15   Authorization Type 8   Authorization Time Period 10 (G Code)   PT Start Time 1750   PT Stop Time 1830   PT Time Calculation (min) 40 min   Activity Tolerance Patient tolerated treatment well   Behavior During Therapy [facility] for tasks assessed/performed      Past Medical History  Diagnosis Date  . Anxiety   . Asthma   . Arthritis   . Depression   . Diabetes mellitus without complication ([place])   . GERD (gastroesophageal reflux disease)   . Hypertension   . COPD (chronic obstructive pulmonary disease) [facility])     Past Surgical History  Procedure Laterality Date  . Joint replacement Right     both knees partial replacements dates unknown  . Cesarean section  1995  . Cholecystectomy      There were no vitals filed for this visit.      Subjective Assessment - 11/09/15 1757    Subjective Patient reports being tired and not hungry today. she reports she was seen at pain clinic Monday and weight 230# and would like to lose weight.    Limitations Walking;Standing;House hold activities   Patient Stated Goals Patient would like to start walking longer distances again.    Currently in Pain? No/denies      Objective: Observation: Patient required assistance to enter clinic with wheelchair today and KAFO left LE for support Transfers: W/C to/from treatment table independent with close supervision x 1; W/C to car with standby assist x 1 Tone: increased extensor tone both LE's and left UE>right UE   Treatment: Therapeutic  Exercise; patient performed exercises with guidance, verbal and tactile cues and demonstration of PT: Sitting: rhythmic stabilization flexion, extension, rotations x 10 reps each scapular rows with green resistive band x 15 reps Bilateral flexion with 3# weight overhead x 10 reps Left hip ER with hip flexion: PROM/AAROM with hold at end range x 10 reps Press heel into balance stone 2 x 15 reps each LE Sit to stand from high table (23") 2 x 5 reps with contact guard/minimal assist Hamstring and calf stretches for each LE 3 reps 20 second holds   Patient response to treatment: improved flexibility in trunk with decreased stiffness, exercises required assistance and repetition; fatigue noted with all exercises; mild wheezing noted with UE exercises; fatigue limited UE exercises           PT Long Term Goals - 10/03/15 2011    PT LONG TERM GOAL #1   Title Patient will demonstrate ability to walk 30 ft with minor fatigue by 12/26/15 to demonstrate functional improvement in ability to ambulate   Baseline Ambulates 8 ft with increased fatigue   Status New   PT LONG TERM GOAL #2   Title Patient will improve ROM in both hips with SLR to 60 degrees by 12/26/15 for improved mobility with transfers and walking with improved gait pattern.   Baseline decreased Hamstring length with SLR to 40 degrees bilaterally (increased tone)  Status New   PT LONG TERM GOAL #3   Title Patient will demonstrate a LEFS score of 25/80 by 12/26/15 to show significant improvement and LE functions indicating improved ability to perform standing activties   Baseline LEFS: 11/80 (Severe self perceived dysfunction)   Status New   PT LONG TERM GOAL #4   Title Patient will demonstrate a MODI score of 40% by 12/26/15 to demonstrate singificant improvement in lumbar function and ability to ambulate.    Baseline MODI: 58% (severe-moderate self perceived dysfucntion)   Status New   PT LONG TERM GOAL #5   Title Patient will  be independent with self management of select exercises and control of tone with home program and family assistance as needed by 12/26/2015   Baseline Patient is limited in knowledge and abiltiy to perform exercises independently with minimal family support or outside assistance   Status New               Plan - 11/09/15 1830    Clinical Impression Statement Patient's fatigue and wheezing limit therapy session activities today. She was able to perform sit to stand with less effort demonstrating improving strength in both LE's. She continues with limitations for walking and will require additional physical therapy intervention to achieve goals.     Rehab Potential Fair   PT Frequency 2x / week   PT Duration 12 weeks   PT Treatment/Interventions Gait training;Manual techniques;Therapeutic exercise;Electrical Stimulation;Moist Heat;Patient/family education;Passive range of motion;Ultrasound;Therapeutic activities;Energy conservation;Neuromuscular re-education;Balance training   PT Next Visit Plan flexiblity and strengthening exercises with assistance, estim./modalities as needed for pain control   PT Home Exercise Plan pulley for ROM/core exercises, continue with stretching exercises for ER of hip and hamstring stretches       Patient will benefit from skilled therapeutic intervention in order to improve the following deficits and impairments:  Decreased strength, Difficulty walking, Impaired flexibility, Decreased mobility, Decreased range of motion, Pain, Impaired tone, Decreased endurance  Visit Diagnosis: Muscle weakness (generalized)  Difficulty in walking, not elsewhere classified  Pain in left knee  Pain in right knee     Problem List Patient Active Problem List   Diagnosis Date Noted  . Acute renal failure (ARF) ([place]) 02/19/2015    Jomarie Longs PT 11/10/2015, 6:40 PM  [facility] [address] [place], [address] Phone: [phone]   Fax:  [phone]  Name: Traci Davis MRN: [id number] Date of Birth: 1970-11-22

## 2015-11-14 ENCOUNTER — Ambulatory Visit: Payer: Medicare Other | Admitting: Physical Therapy

## 2015-11-16 ENCOUNTER — Ambulatory Visit: Payer: Medicare Other | Admitting: Physical Therapy

## 2015-11-17 ENCOUNTER — Encounter: Payer: Medicare Other | Admitting: Physical Therapy

## 2015-11-21 ENCOUNTER — Ambulatory Visit: Payer: Medicare Other | Admitting: Physical Therapy

## 2015-11-21 ENCOUNTER — Encounter: Payer: Self-pay | Admitting: Physical Therapy

## 2015-11-21 ENCOUNTER — Encounter: Payer: Medicare Other | Admitting: Physical Therapy

## 2015-11-21 DIAGNOSIS — M6281 Muscle weakness (generalized): Secondary | ICD-10-CM | POA: Diagnosis not present

## 2015-11-21 DIAGNOSIS — M25561 Pain in right knee: Secondary | ICD-10-CM

## 2015-11-21 DIAGNOSIS — M25562 Pain in left knee: Secondary | ICD-10-CM

## 2015-11-21 DIAGNOSIS — R262 Difficulty in walking, not elsewhere classified: Secondary | ICD-10-CM

## 2015-11-22 NOTE — Therapy (Signed)
[facility] [address] [place], [address] Phone: [phone]   Fax:  [phone]  Physical Therapy Treatment  Patient Details  Name: Traci Davis MRN: [id number] Date of Birth: 1971/03/31 Referring Provider: Leretha Dykes MD  Encounter Date: 11/21/2015      PT End of Session - 11/21/15 1955    Visit Number 9   Number of Visits 24   Date for PT Re-Evaluation 12/26/15   Authorization Type 9   Authorization Time Period 10 (G Code)   PT Start Time 1858   PT Stop Time 1945   PT Time Calculation (min) 47 min   Equipment Utilized During Treatment Other (comment)  KAFO LLE   Activity Tolerance Patient tolerated treatment well   Behavior During Therapy [facility] for tasks assessed/performed      Past Medical History:  Diagnosis Date  . Anxiety   . Arthritis   . Asthma   . COPD (chronic obstructive pulmonary disease) ([place])   . Depression   . Diabetes mellitus without complication ([place])   . GERD (gastroesophageal reflux disease)   . Hypertension     Past Surgical History:  Procedure Laterality Date  . CESAREAN SECTION  1995  . CHOLECYSTECTOMY    . JOINT REPLACEMENT Right    both knees partial replacements dates unknown    There were no vitals filed for this visit.      Subjective Assessment - 11/21/15 1858    Subjective Patient reports she is depressed and working on getting her medication changed to help with this. Her legs are hurting on the inner thighs and she reports her right hip is hurting as well today for no apparent reason. she is still getting up more at home and abel to walk and stand a little longer to make tea and wash dishes than when she began therapy.    Limitations Walking;Standing;House hold activities   Patient Stated Goals Patient would like to start walking longer distances again.    Currently in Pain? Yes   Pain Score 4    Pain Location Hip   Pain Orientation  Right   Pain Descriptors / Indicators Aching;Stabbing  stabbing pain with movement and standing and walking   Pain Type Chronic pain   Pain Onset More than a month ago   Pain Frequency Intermittent   Aggravating Factors  weight bearing positions   Pain Relieving Factors resting       Objective: Observation: Patient required assistance to enter clinic with wheelchair today and KAFO left LE for support Transfers: W/C to/from treatment table independent with close supervision x 1; W/C to car with standby assist x 1 Tone: increased extensor tone both LE's and left UE>right UE   Treatment: Therapeutic Exercise; patient performed exercises with guidance, verbal and tactile cues and demonstration of PT: Sitting: Ball toss with 2# ball x 25 reps rhythmic stabilization flexion, extension, rotations x 15 reps each scapular rows with green resistive band x 15 reps Bilateral flexion with 3# weight overhead x 10 reps Press heel into balance stone 2 x 15 reps each LE Supine/reclined position:  Hamstring and calf stretches for each LE 3 reps 20 second holds Left hip ER with hip flexion: PROM/AAROM with hold at end range x 10 reps Hip abduction/adduction with assistance   Patient response to treatment: improved flexibility in trunk with decreased stiffness, exercises required assistance and repetition; fatigue noted with all exercises; slow cautious motion noted with  all transfers today required close supervision for safety.            PT Education - 11/21/15 1906    Education provided Yes   Education Details HEP exercise for ROM strengthening   Person(s) Educated Patient   Methods Explanation;Demonstration;Verbal cues   Comprehension Verbalized understanding;Returned demonstration;Verbal cues required             PT Long Term Goals - 10/03/15 2011      PT LONG TERM GOAL #1   Title Patient will demonstrate ability to walk 30 ft with minor fatigue by 12/26/15 to  demonstrate functional improvement in ability to ambulate   Baseline Ambulates 8 ft with increased fatigue   Status New     PT LONG TERM GOAL #2   Title Patient will improve ROM in both hips with SLR to 60 degrees by 12/26/15 for improved mobility with transfers and walking with improved gait pattern.   Baseline decreased Hamstring length with SLR to 40 degrees bilaterally (increased tone)   Status New     PT LONG TERM GOAL #3   Title Patient will demonstrate a LEFS score of 25/80 by 12/26/15 to show significant improvement and LE functions indicating improved ability to perform standing activties   Baseline LEFS: 11/80 (Severe self perceived dysfunction)   Status New     PT LONG TERM GOAL #4   Title Patient will demonstrate a MODI score of 40% by 12/26/15 to demonstrate singificant improvement in lumbar function and ability to ambulate.    Baseline MODI: 58% (severe-moderate self perceived dysfucntion)   Status New     PT LONG TERM GOAL #5   Title Patient will be independent with self management of select exercises and control of tone with home program and family assistance as needed by 12/26/2015   Baseline Patient is limited in knowledge and abiltiy to perform exercises independently with minimal family support or outside assistance   Status New               Plan - 11/21/15 2000    Clinical Impression Statement Patient demonstrates improved flexibility and decreased pain in LE's with good carry over between sessions. She is able to stand and walk with less difficulty in the home for kitchen tasks. She continues with limitations of pain and weakness in trunk and LE's and will benefit from additional physical therapy intervention to achieve improved function with daily household chores.    Rehab Potential Fair   PT Frequency 2x / week   PT Duration 12 weeks   PT Treatment/Interventions Gait training;Manual techniques;Therapeutic exercise;Electrical Stimulation;Moist  Heat;Patient/family education;Passive range of motion;Ultrasound;Therapeutic activities;Energy conservation;Neuromuscular re-education;Balance training   PT Next Visit Plan flexiblity and strengthening exercises with assistance, estim./modalities as needed for pain control   PT Home Exercise Plan  continue with stretching exercises for ER of hip and hamstring stretches       Patient will benefit from skilled therapeutic intervention in order to improve the following deficits and impairments:  Decreased strength, Difficulty walking, Impaired flexibility, Decreased mobility, Decreased range of motion, Pain, Impaired tone, Decreased endurance  Visit Diagnosis: Muscle weakness (generalized)  Difficulty in walking, not elsewhere classified  Pain in left knee  Pain in right knee     Problem List Patient Active Problem List   Diagnosis Date Noted  . Acute renal failure (ARF) ([place]) 02/19/2015    Jomarie Longs PT 11/22/2015, 10:53 PM  [facility] [address], [place], [address] Phone: [phone]   Fax:  [phone]  Name: Traci Davis MRN: [id number] Date of Birth: 06-06-1970

## 2015-11-23 ENCOUNTER — Ambulatory Visit: Payer: Medicare Other | Admitting: Physical Therapy

## 2015-11-28 ENCOUNTER — Ambulatory Visit: Payer: Medicare Other | Admitting: Physical Therapy

## 2015-11-28 ENCOUNTER — Encounter: Payer: Self-pay | Admitting: Physical Therapy

## 2015-11-28 DIAGNOSIS — M6281 Muscle weakness (generalized): Secondary | ICD-10-CM | POA: Diagnosis not present

## 2015-11-28 DIAGNOSIS — M25562 Pain in left knee: Secondary | ICD-10-CM

## 2015-11-28 DIAGNOSIS — M25561 Pain in right knee: Secondary | ICD-10-CM

## 2015-11-28 DIAGNOSIS — R262 Difficulty in walking, not elsewhere classified: Secondary | ICD-10-CM

## 2015-11-28 NOTE — Therapy (Signed)
[facility] [address] [place], [address] Phone: [phone]   Fax:  [phone]  Physical Therapy Treatment  Patient Details  Name: Traci Davis MRN: [id number] Date of Birth: 1970/06/08 Referring Provider: Leretha Dykes MD  Encounter Date: 11/28/2015      PT End of Session - 11/28/15 1928    Visit Number 10   Number of Visits 24   Date for PT Re-Evaluation 12/26/15   Authorization Type 10   Authorization Time Period 10 (G Code)   PT Start Time 1616   PT Stop Time 1651   PT Time Calculation (min) 35 min   Equipment Utilized During Treatment Other (comment)  KAFO left LE, wheelchair for mobility   Activity Tolerance Patient tolerated treatment well   Behavior During Therapy [facility] for tasks assessed/performed      Past Medical History:  Diagnosis Date  . Anxiety   . Arthritis   . Asthma   . COPD (chronic obstructive pulmonary disease) ([place])   . Depression   . Diabetes mellitus without complication ([place])   . GERD (gastroesophageal reflux disease)   . Hypertension     Past Surgical History:  Procedure Laterality Date  . CESAREAN SECTION  1995  . CHOLECYSTECTOMY    . JOINT REPLACEMENT Right    both knees partial replacements dates unknown    There were no vitals filed for this visit.      Subjective Assessment - 11/28/15 1618    Subjective Patient reports she is having increased right hip pain today and did feel less pain following treatment previous visits. She has been sitting more and is trying to get up and move, stand more at home. She feels her LE's are more swollen today and is taking her fluid pill every other day now instead of every day.    Limitations Walking;Standing;House hold activities   Patient Stated Goals Patient would like to start walking longer distances again.    Currently in Pain? Yes   Pain Score 4    Pain Location Hip   Pain Orientation Right   Pain  Descriptors / Indicators Aching   Pain Type Chronic pain   Pain Onset More than a month ago   Pain Frequency Intermittent      Objective: Observation: Patient required assistance to enter clinic with wheelchair today and KAFO left LE for support Transfers: W/C to/from treatment table independent with close supervision x 1; W/C to car with standby assist x 1 ROM: decreased hip rotation right and left hip 50% Strength: decreased hip flexion 3-/5, knee extension left 3/5 within available ROM, right knee 4/5, hip extension right 3/5, left 3/5, ankles DF decreased bilaterally with increased PF tone noted   Treatment: Therapeutic Exercise; patient performed exercises with guidance, verbal and tactile cues and demonstration of PT: Sitting: Ball toss with 2# ball x 25 reps rhythmic stabilization flexion, extension, rotations x 15 reps each scapular rows with green resistive band x 15 reps Press heel into balance stone  x 25 reps each LE Hamstring and calf stretches for each LE 3 reps 20 second holds Left and right hip ER with hip flexion: PROM/AAROM with hold at end range 3 x 20 seconds Hip abduction right with green resistive band x 15 reps    Patient response to treatment:Required assistance to complete all exercises with improved flexibility noted in both hip ER with repetition; increased extensor tone limits patient's ability to perform exercises  independently        PT Education - 11/28/15 1700    Education provided Yes   Education Details HEP; re inforced the need to perform stretches/ROM exercise for LE's    Person(s) Educated Patient   Methods Explanation;Demonstration;Verbal cues   Comprehension Verbalized understanding;Returned demonstration;Verbal cues required             PT Long Term Goals - 11/28/15 1933      PT LONG TERM GOAL #1   Title Patient will demonstrate ability to walk 30 ft with minor fatigue by 12/26/15 to demonstrate functional improvement in  ability to ambulate   Baseline Ambulates 8 ft with increased fatigue   Status On-going     PT LONG TERM GOAL #2   Title Patient will improve ROM in both hips with SLR to 60 degrees by 12/26/15 for improved mobility with transfers and walking with improved gait pattern.   Baseline decreased Hamstring length with SLR to 40 degrees bilaterally (increased tone)   Status On-going     PT LONG TERM GOAL #3   Title Patient will demonstrate a LEFS score of 25/80 by 12/26/15 to show significant improvement and LE functions indicating improved ability to perform standing activties   Baseline LEFS: 11/80 (Severe self perceived dysfunction)   Status On-going     PT LONG TERM GOAL #4   Title Patient will demonstrate a MODI score of 40% by 12/26/15 to demonstrate singificant improvement in lumbar function and ability to ambulate.    Baseline MODI: 58% (severe-moderate self perceived dysfucntion)   Status On-going     PT LONG TERM GOAL #5   Title Patient will be independent with self management of select exercises and control of tone with home program and family assistance as needed by 12/26/2015   Baseline Patient is limited in knowledge and abiltiy to perform exercises independently with minimal family support or outside assistance   Status On-going               Plan - 11/28/15 1700    Clinical Impression Statement Patient with time contraints due to daughter having to go to school. She demonstrated improved hip rotation bilaterally following exercises indicating improved flexiblity in joints/soft tissue. She is able to perform standing and walking at home with less difficulty indicating improving endurance and strength. Increased extensor tone limits patient's ability to perform exercises independently. Her current impairment level is 50% based on patient interview, clinical judgment, ROM, strength deficits. She is progressing with all goals. She continues with limitations of pain in knees, left  hip and decreased endurance and strength and will therefore benefit from additional physical therapy intervention to achieve goals.    Rehab Potential Fair   PT Frequency 2x / week   PT Duration 12 weeks   PT Treatment/Interventions Gait training;Manual techniques;Therapeutic exercise;Electrical Stimulation;Moist Heat;Patient/family education;Passive range of motion;Ultrasound;Therapeutic activities;Energy conservation;Neuromuscular re-education;Balance training   PT Next Visit Plan flexiblity and strengthening exercises with assistance, estim./modalities as needed for pain control   PT Home Exercise Plan  continue with stretching exercises for ER of hip and hamstring stretches    Consulted and Agree with Plan of Care Patient      Patient will benefit from skilled therapeutic intervention in order to improve the following deficits and impairments:  Decreased strength, Difficulty walking, Impaired flexibility, Decreased mobility, Decreased range of motion, Pain, Impaired tone, Decreased endurance  Visit Diagnosis: Muscle weakness (generalized)  Difficulty in walking, not elsewhere classified  Pain in left knee  Pain in right knee       G-Codes - 2015/12/24 1934    Functional Assessment Tool Used LEFS, clinical judgment, ROM and strength deficits, pain scale, modified oswestry    Functional Limitation Mobility: Walking and moving around   Mobility: Walking and Moving Around Current Status [phone]) At least 40 percent but less than 60 percent impaired, limited or restricted   Mobility: Walking and Moving Around Goal Status [phone]) At least 20 percent but less than 40 percent impaired, limited or restricted      Problem List Patient Active Problem List   Diagnosis Date Noted  . Acute renal failure (ARF) ([place]) 02/19/2015    Jomarie Longs PT 24-Dec-2015, 7:35 PM  [facility] [address] [place],  [address] Phone: [phone]   Fax:  [phone]  Name: Traci Davis MRN: [id number] Date of Birth: 03-15-1971

## 2015-11-30 ENCOUNTER — Ambulatory Visit: Payer: Medicare Other | Admitting: Physical Therapy

## 2015-12-05 ENCOUNTER — Encounter: Payer: Medicare Other | Admitting: Physical Therapy

## 2015-12-07 ENCOUNTER — Encounter: Payer: Medicare Other | Admitting: Physical Therapy

## 2015-12-12 ENCOUNTER — Encounter: Payer: Medicare Other | Admitting: Physical Therapy

## 2015-12-14 ENCOUNTER — Ambulatory Visit: Payer: Medicare Other | Admitting: Physical Therapy

## 2015-12-23 IMAGING — CR DG KNEE COMPLETE 4+V*R*
1 series · 5 of 5 positions shown · non-contrast
Comparison: 01/13/2013.

CLINICAL DATA: Right knee pain after a fall.

EXAM:
RIGHT KNEE - COMPLETE 4+ VIEW

[Series 1: t knee ap right · 0.14mm/px · 5 of 5 slices shown]
[im 1/5]
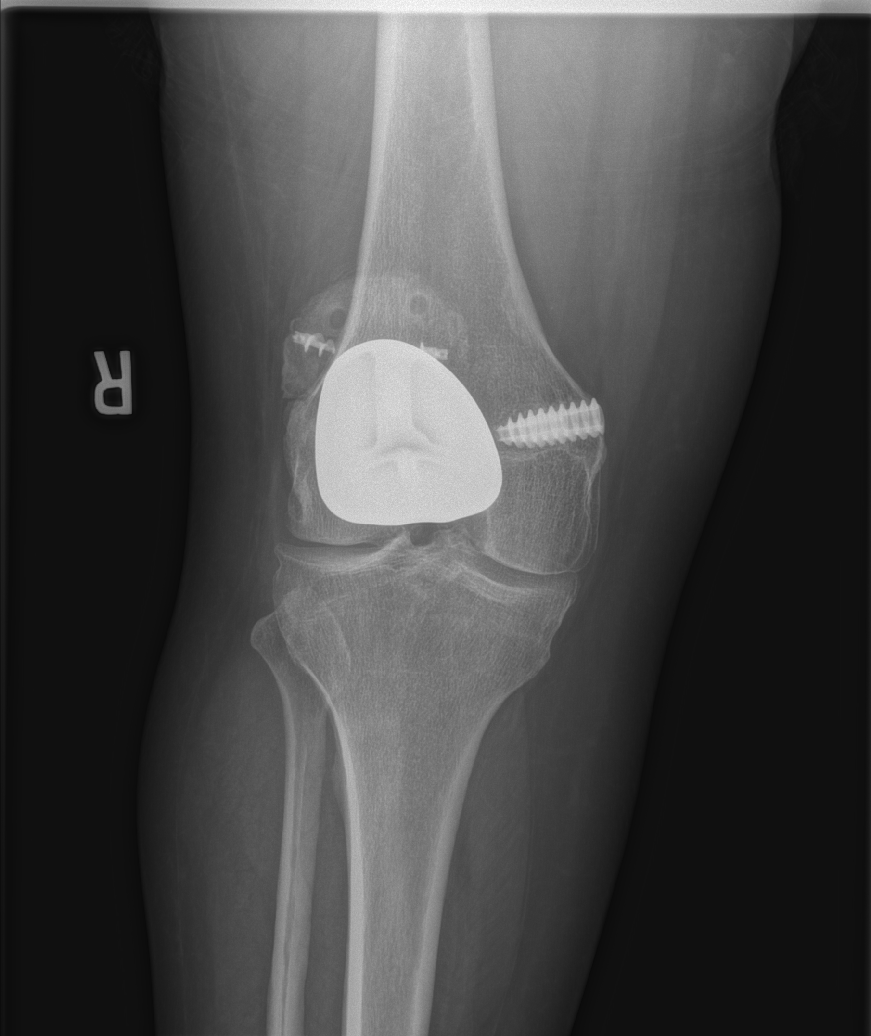
[im 2/5]
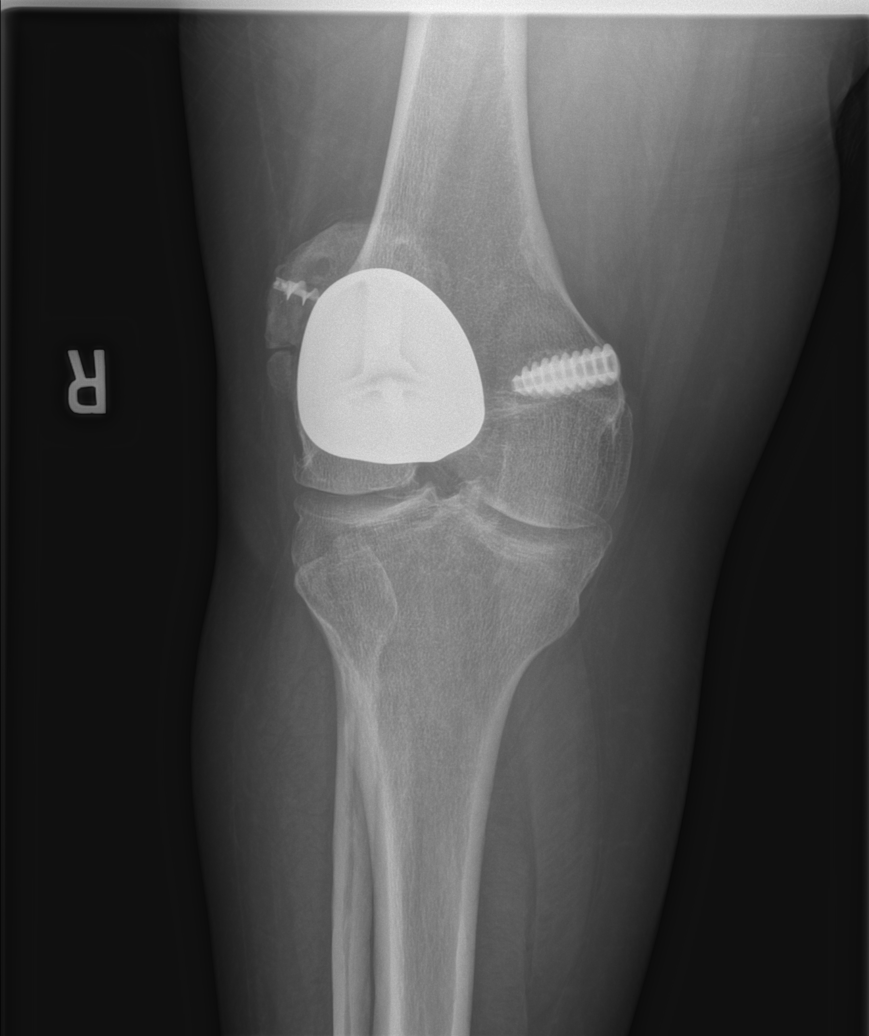
[im 3/5]
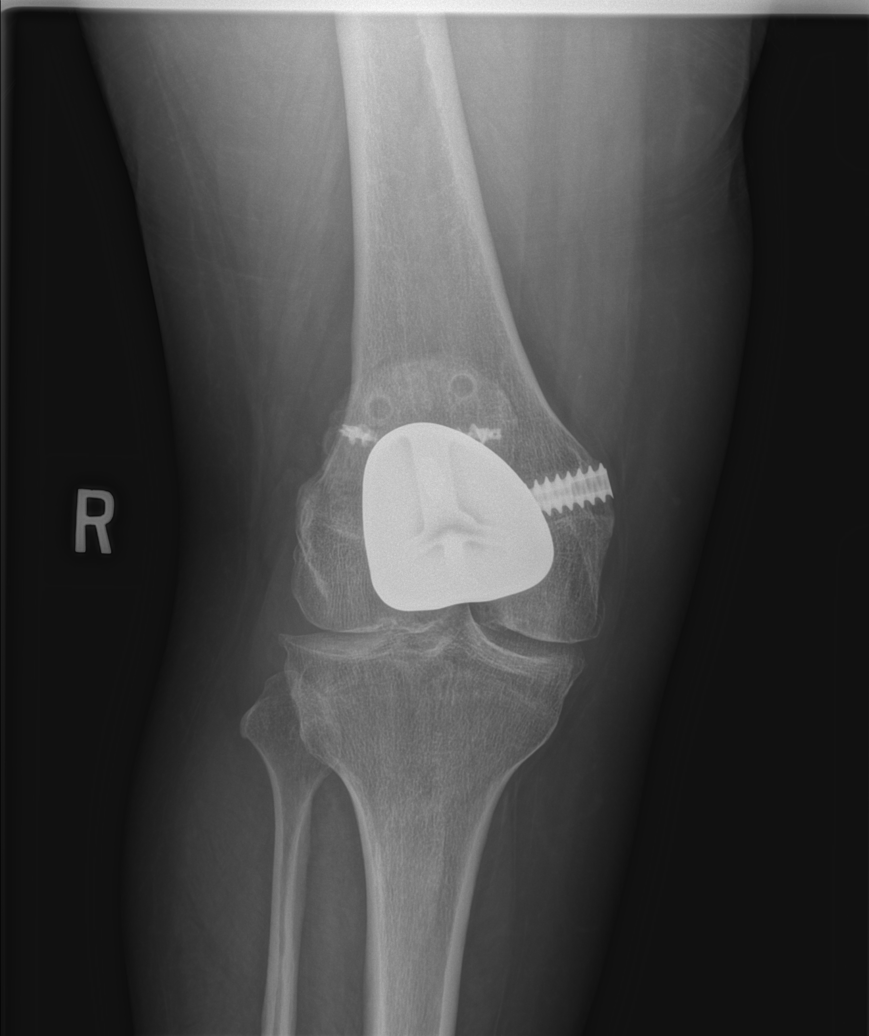
[im 4/5]
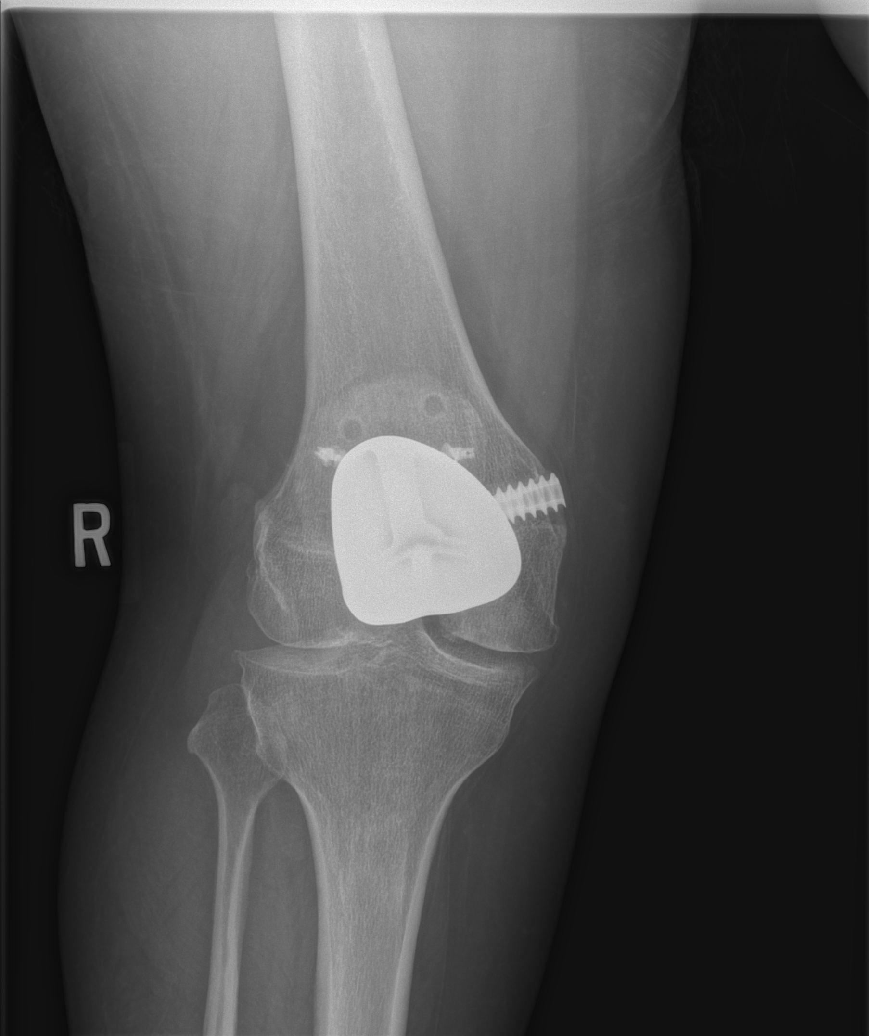
[im 5/5]
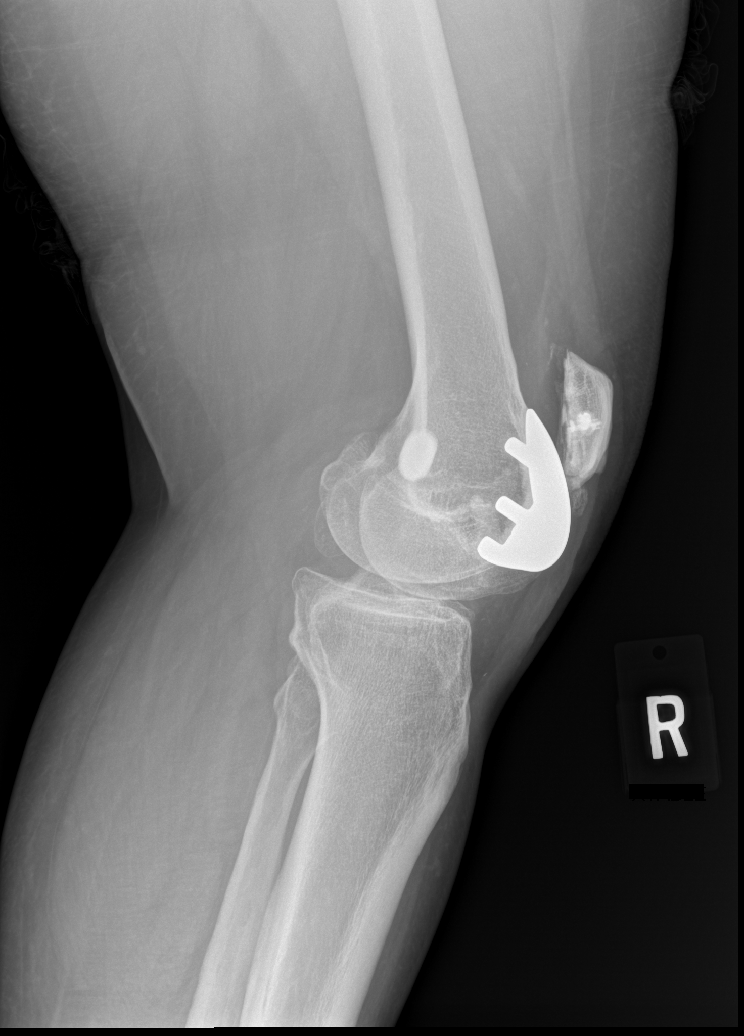

[5 of 5 positions shown; findings below may reference images not displayed]

FINDINGS: Postoperative changes are seen in the patella, which appears high
riding, as well as in the distal femur, as before. No superimposed
fracture. Mild degenerative changes are seen in the knee with medial
and lateral compartment osteophytosis. No joint effusion.
IMPRESSION: 1. Postoperative changes in the patella, which appears high riding,
and distal femur, without joint effusion or fracture.
2. Degenerative changes in the knee.

## 2015-12-23 IMAGING — CR DG CHEST 1V PORT
1 series · 1 of 1 positions shown · non-contrast
Comparison: 07/08/2013

CLINICAL DATA: Chest pain

EXAM:
PORTABLE CHEST - 1 VIEW

[ap]
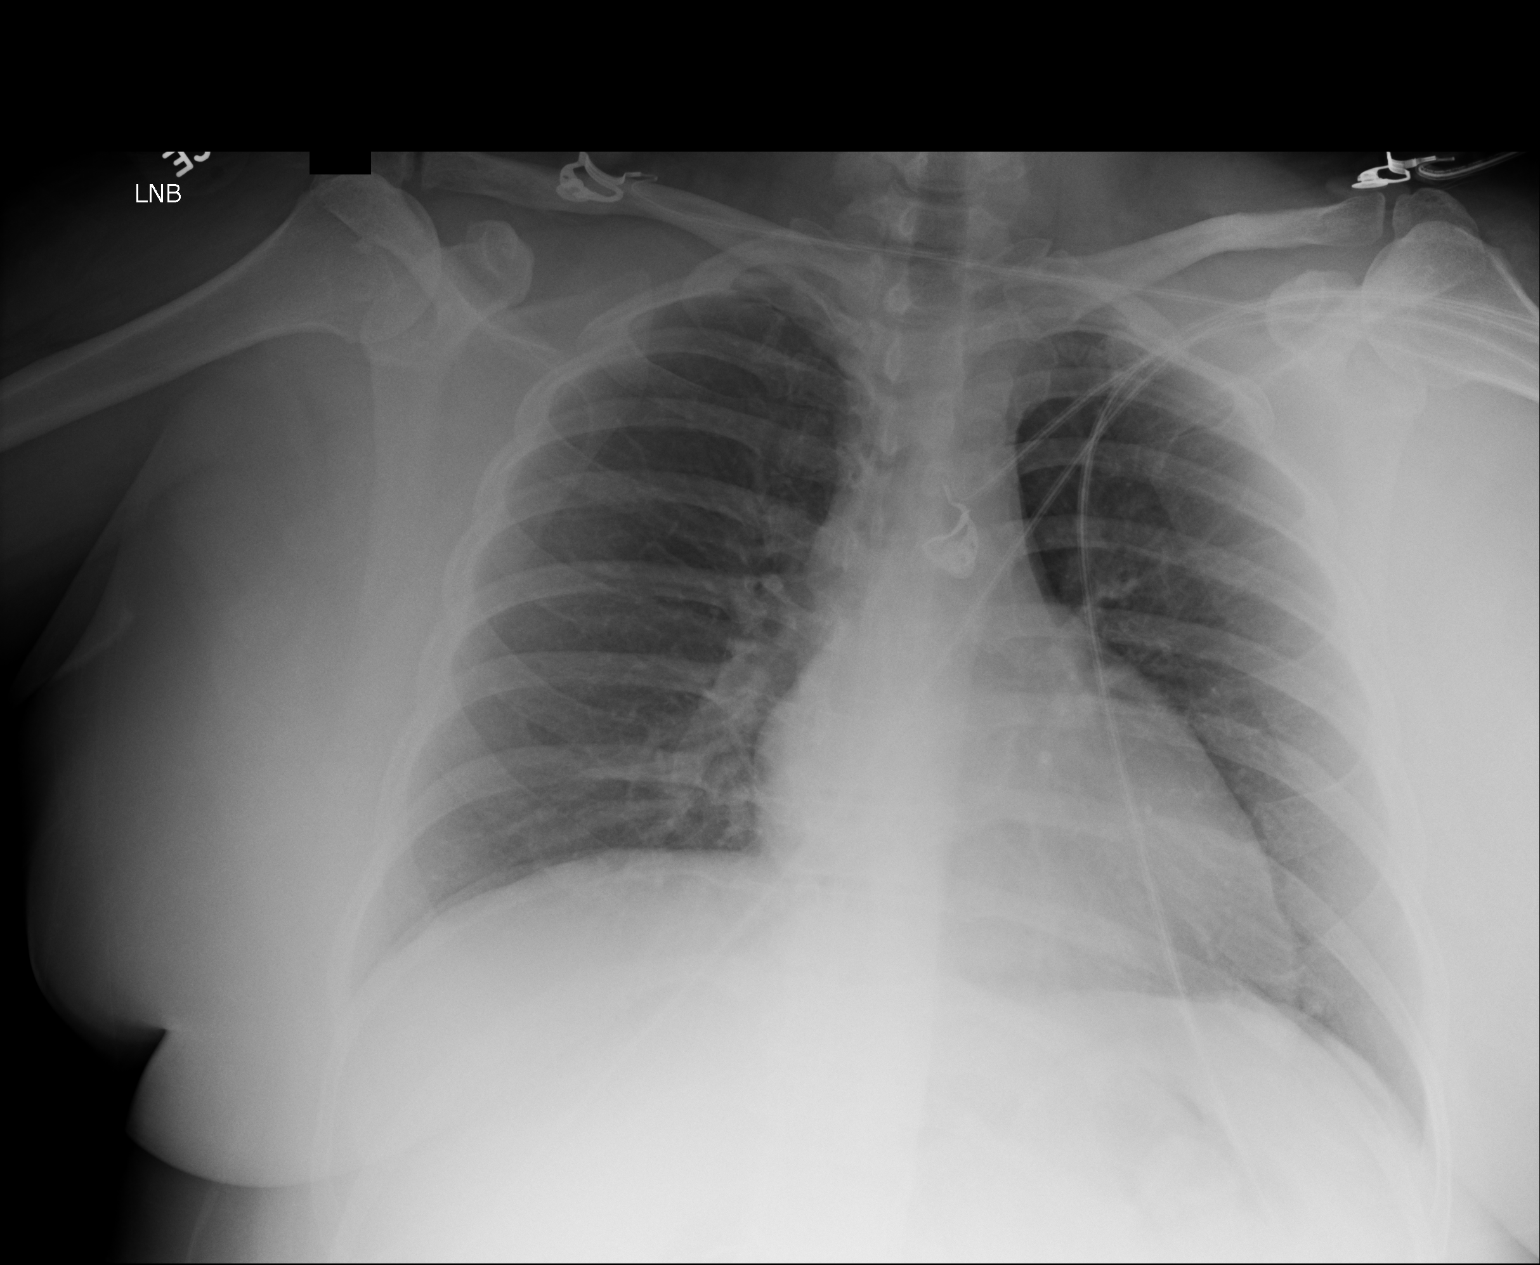

[1 of 1 positions shown; findings below may reference images not displayed]

FINDINGS: The heart size and mediastinal contours are within normal limits.
Both lungs are clear. The visualized skeletal structures are
unremarkable.
IMPRESSION: No active disease.

## 2015-12-29 ENCOUNTER — Ambulatory Visit: Payer: Medicare Other | Admitting: Physical Therapy

## 2016-01-02 IMAGING — CR DG CHEST 2V
1 series · 2 of 2 positions shown · non-contrast
Comparison: Chest radiograph performed 01/04/2014

CLINICAL DATA: Difficulty breathing.

EXAM:
CHEST  2 VIEW

[Series 12: w chest pa · 0.14mm/px · 2 of 2 slices shown]
[im 1/2]
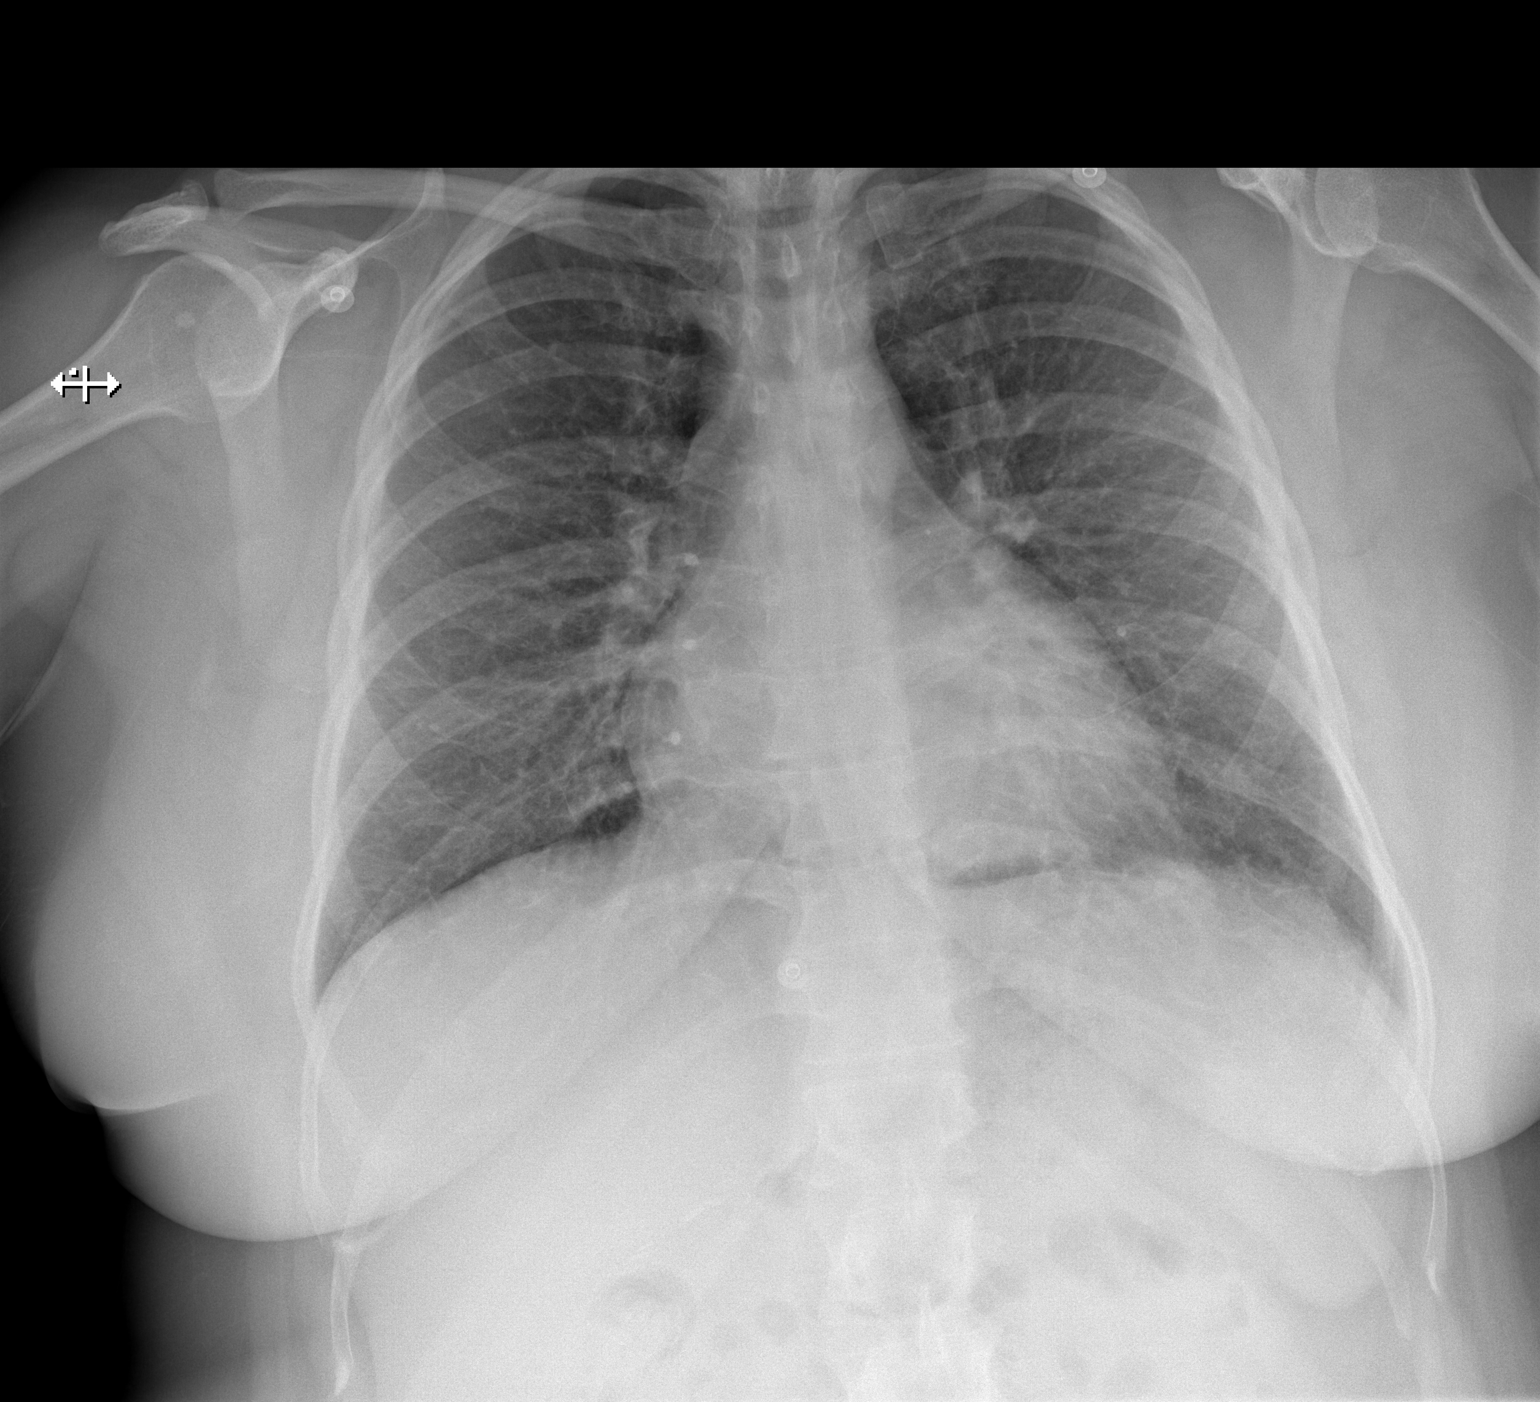
[im 2/2]
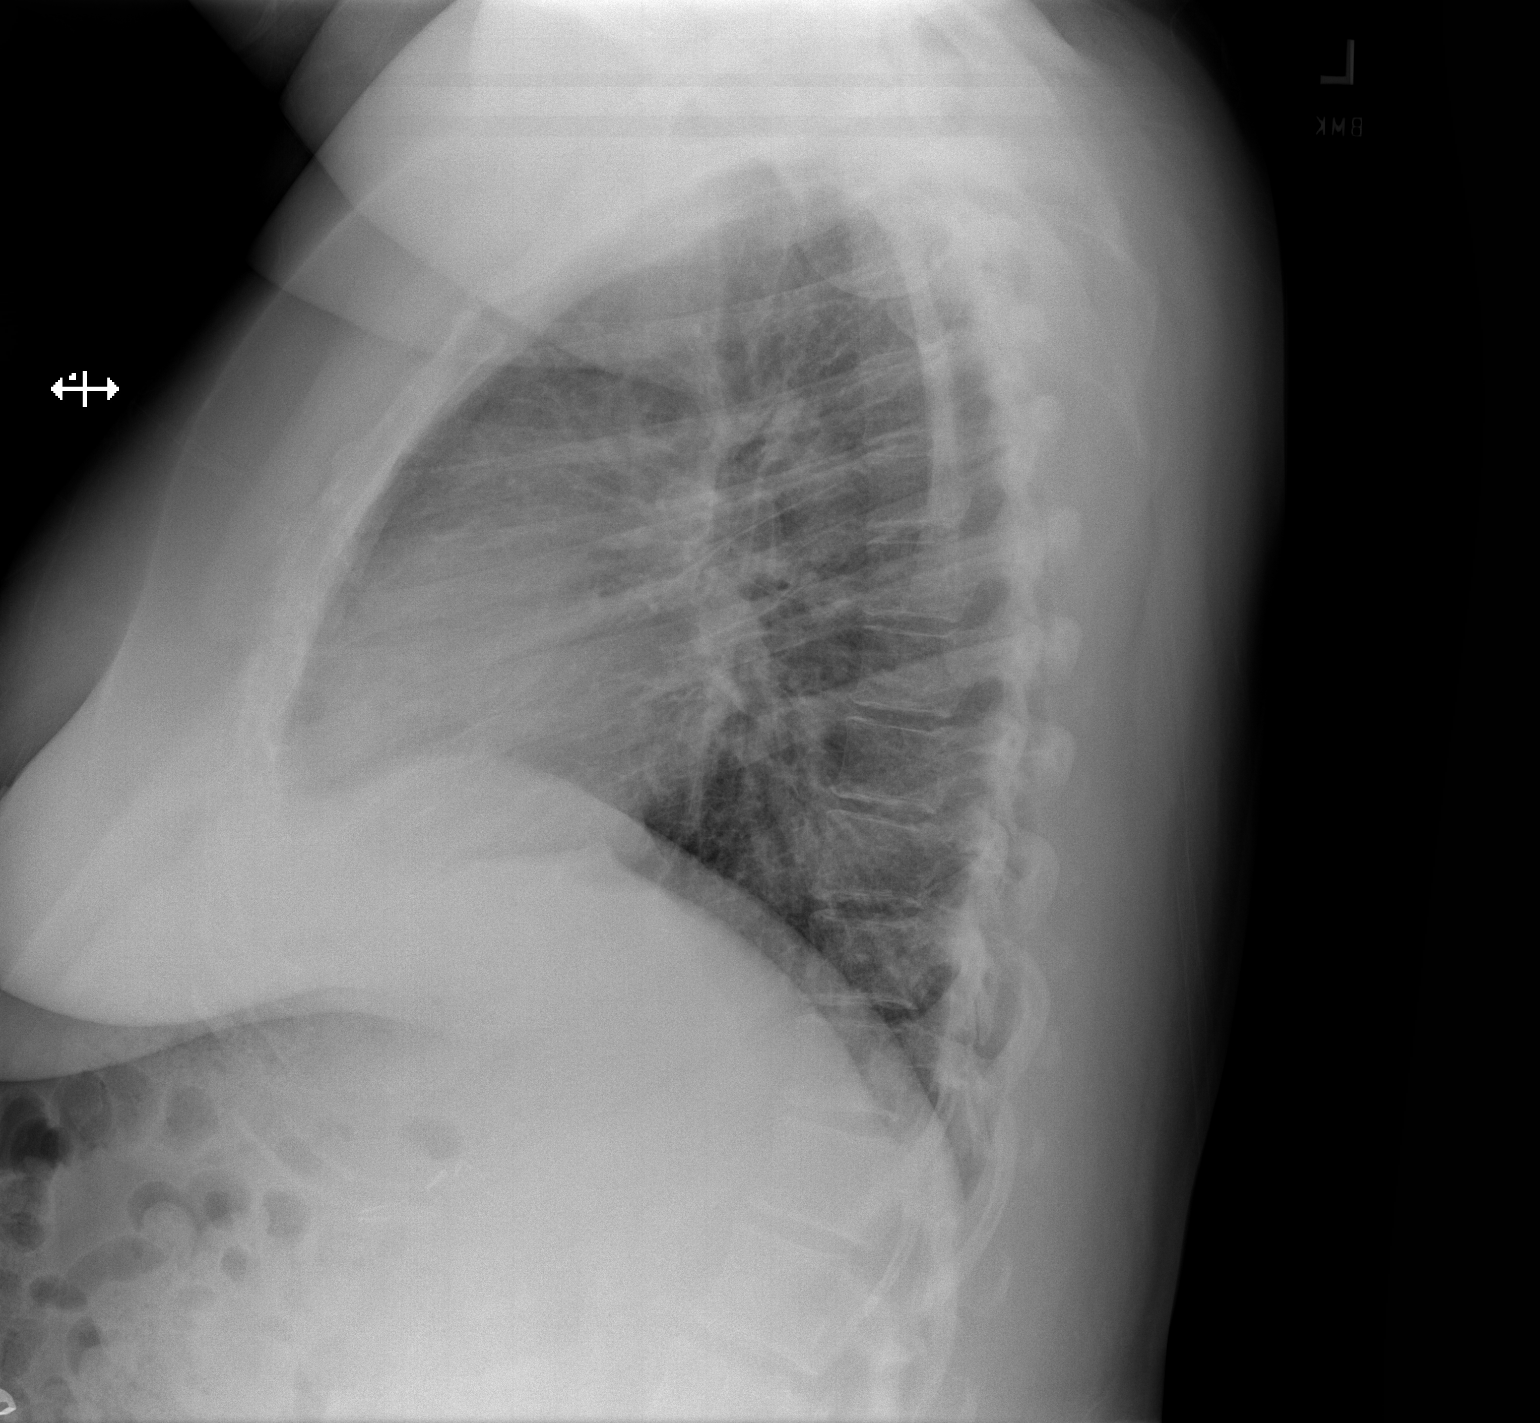

[2 of 2 positions shown; findings below may reference images not displayed]

FINDINGS: The lungs are well-aerated with minimal left basilar atelectasis is
noted. There is no evidence of focal opacification, pleural effusion
or pneumothorax.

The heart is borderline normal in size; the mediastinal contour is
within normal limits. No acute osseous abnormalities are seen.
IMPRESSION: Minimal left basilar atelectasis noted; lungs otherwise clear.

## 2016-02-01 ENCOUNTER — Ambulatory Visit: Payer: Medicare Other | Admitting: Physical Therapy

## 2016-02-16 ENCOUNTER — Ambulatory Visit: Payer: Medicare Other | Admitting: Physical Therapy

## 2016-02-21 ENCOUNTER — Ambulatory Visit: Payer: Medicare Other | Attending: Family Medicine | Admitting: Physical Therapy

## 2016-02-22 ENCOUNTER — Encounter: Payer: Medicare Other | Admitting: Physical Therapy

## 2016-02-28 ENCOUNTER — Encounter: Payer: Medicare Other | Admitting: Physical Therapy

## 2016-03-06 ENCOUNTER — Encounter: Payer: Medicare Other | Admitting: Physical Therapy

## 2016-03-08 ENCOUNTER — Encounter: Payer: Medicare Other | Admitting: Physical Therapy

## 2016-04-02 IMAGING — CT CT ABD-PELV W/ CM
2 of 5 series · 17 of 46 positions shown, 19 images · IV contrast (omnipaque)
Comparison: CT scan of December 10, 2006.

CLINICAL DATA: Acute epigastric abdominal pain.

EXAM:
CT ABDOMEN AND PELVIS WITH CONTRAST
TECHNIQUE: Multidetector CT imaging of the abdomen and pelvis was performed
using the standard protocol following bolus administration of
intravenous contrast.
CONTRAST:  100 mL Omnipaque 300 intravenously.

[Series 2: routine abd pel with · axial · 0.78mm/px · z∈[-582,-162]mm · 14 of 94 slices shown, 16 images]
[im 5/94  soft-tissue]
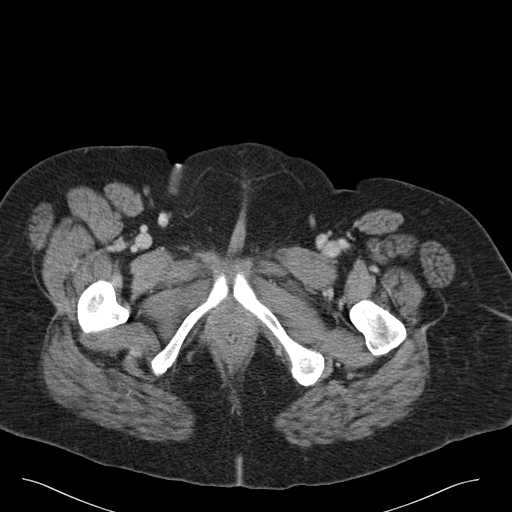
[im 5/94  bone]
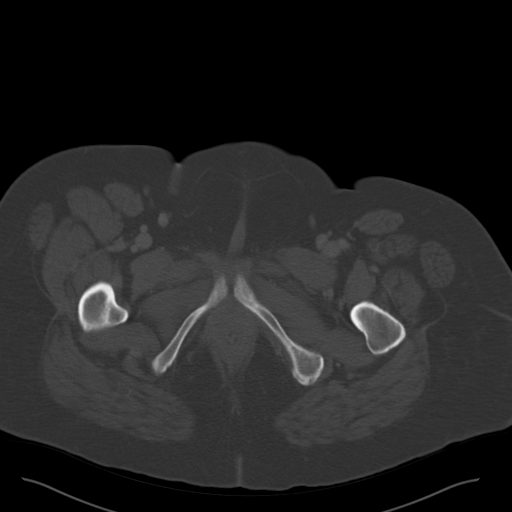
[im 14/94  soft-tissue]
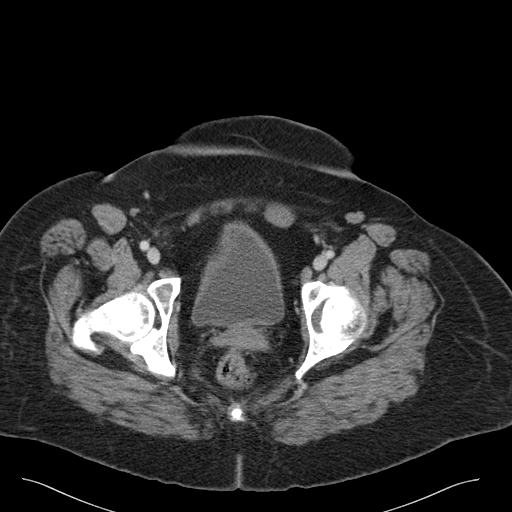
[im 19/94  soft-tissue]
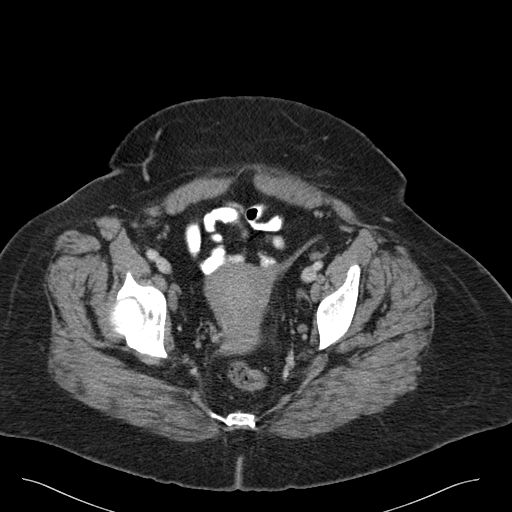
[im 24/94  soft-tissue]
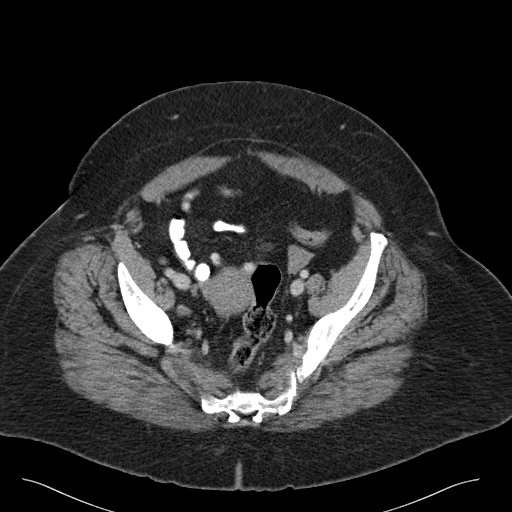
[im 33/94  soft-tissue]
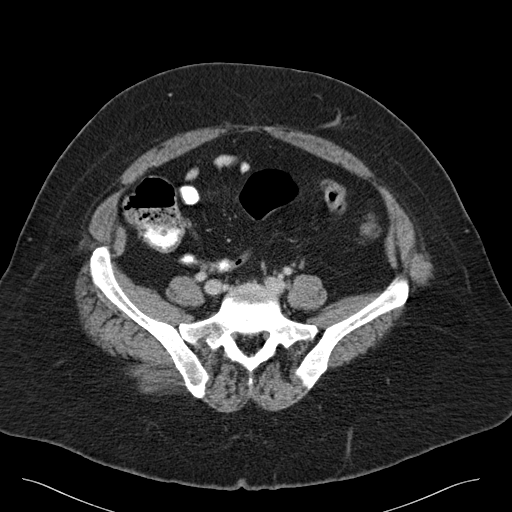
[im 38/94  soft-tissue]
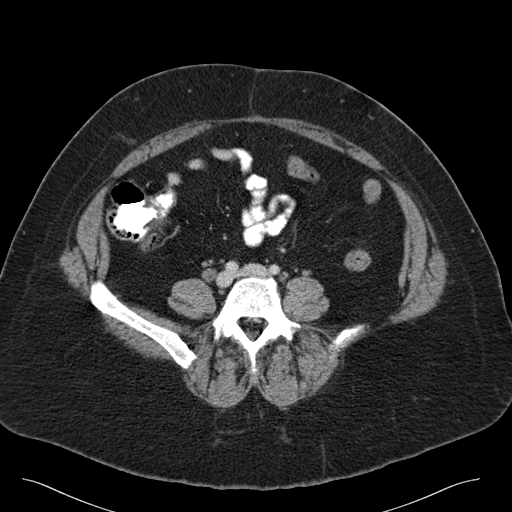
[im 42/94  soft-tissue]
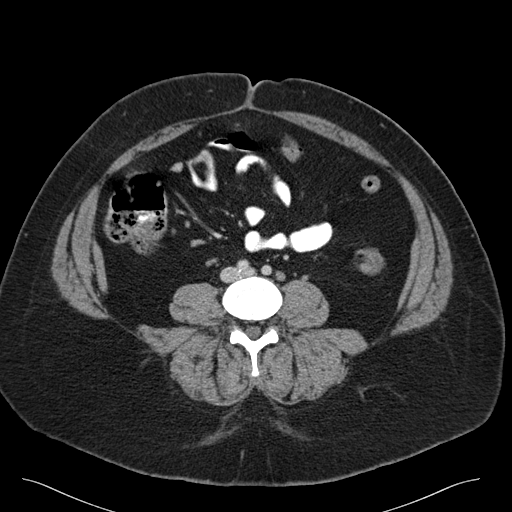
[im 52/94  soft-tissue]
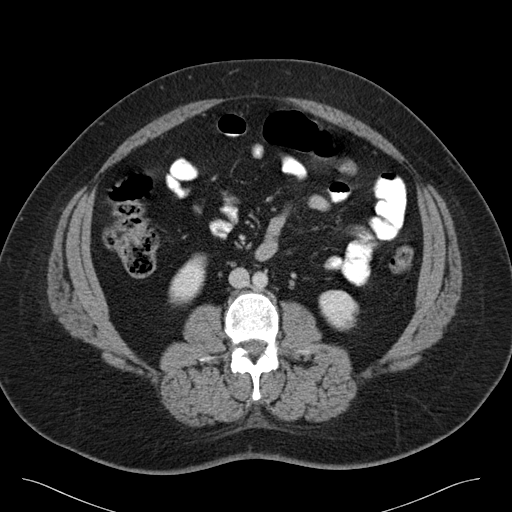
[im 56/94  soft-tissue]
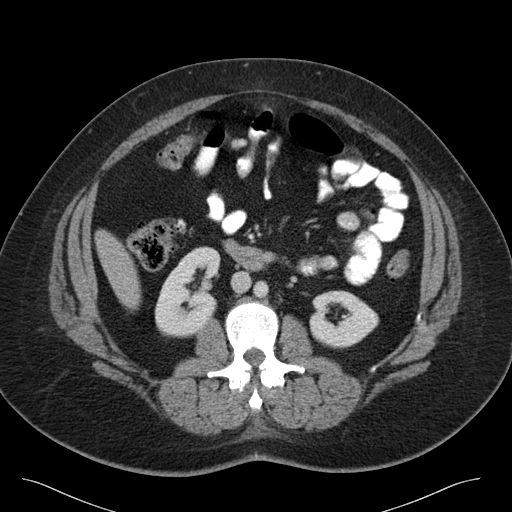
[im 56/94  bone]
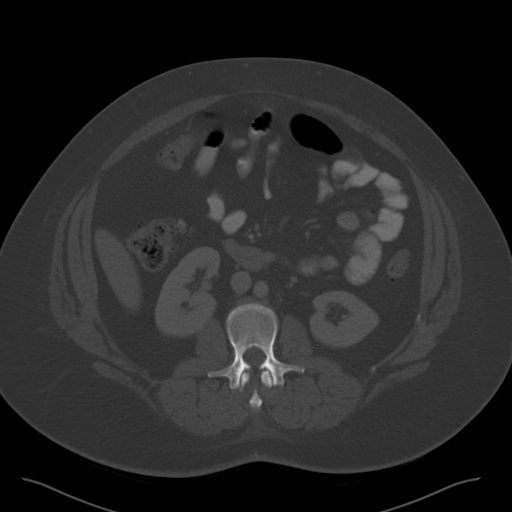
[im 61/94  soft-tissue]
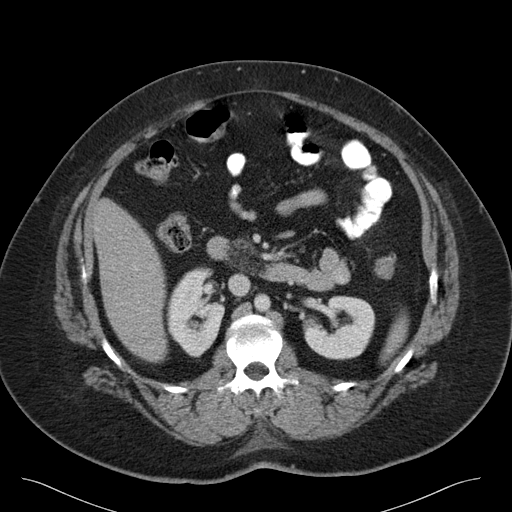
[im 70/94  soft-tissue]
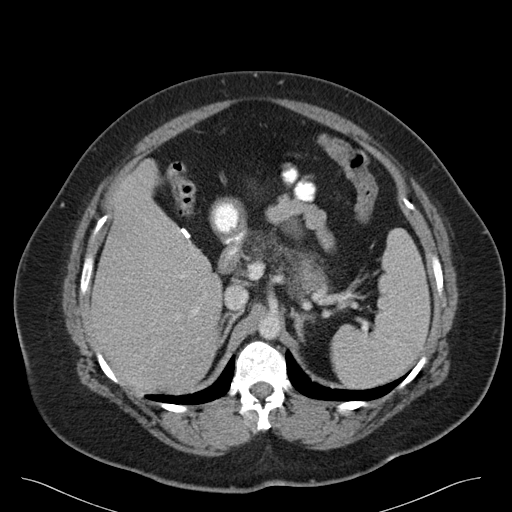
[im 75/94  soft-tissue]
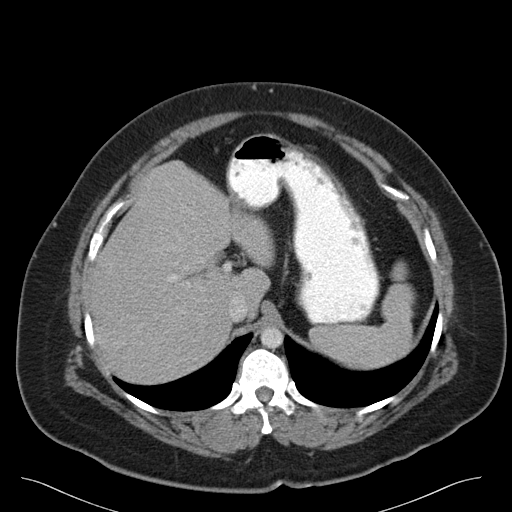
[im 80/94  soft-tissue]
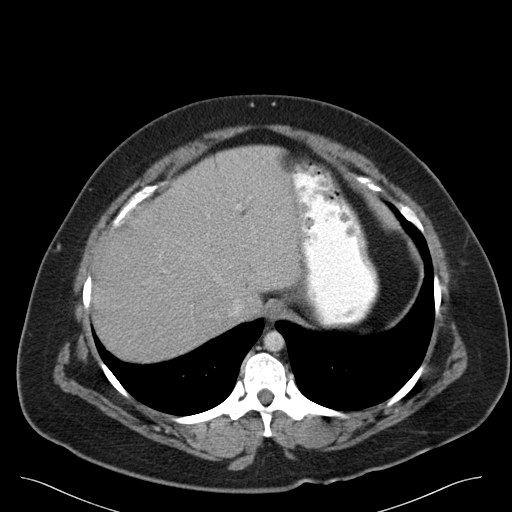
[im 89/94  soft-tissue]
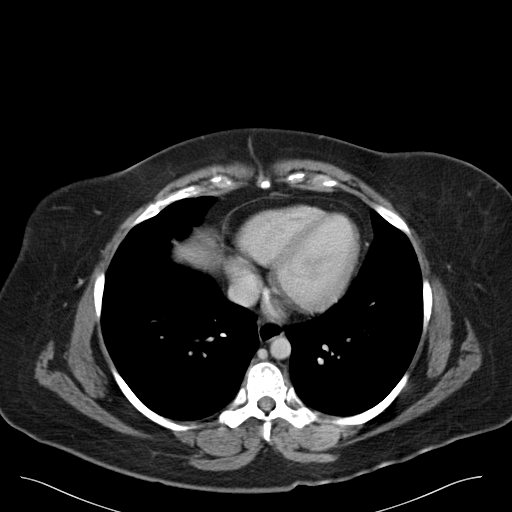

[Series 6: cor routine abd pel with · coronal · 0.96mm/px · 3 of 157 slices shown]
[im 53/157  soft-tissue]
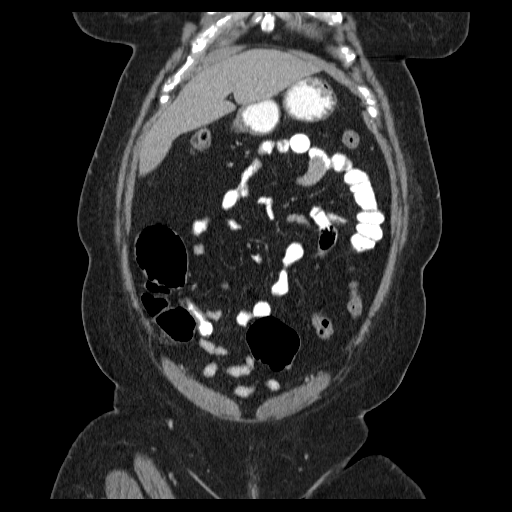
[im 70/157  soft-tissue]
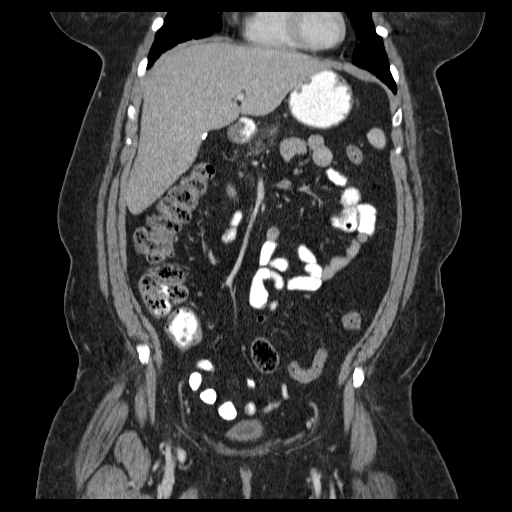
[im 87/157  soft-tissue]
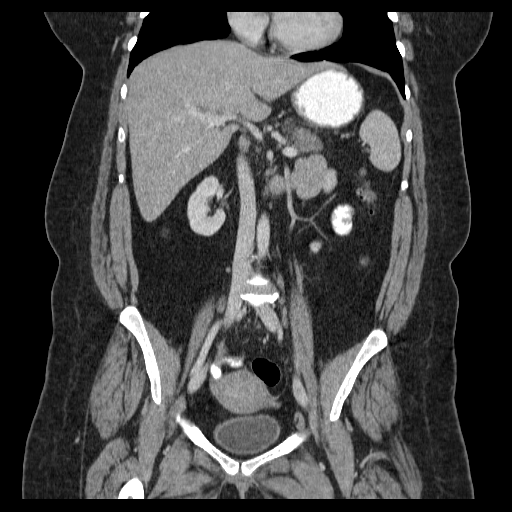

[17 of 46 positions shown; findings below may reference images not displayed]

FINDINGS: 5 mm nodule is noted laterally in left lower lobe. No significant
osseous abnormality is noted.

Status post cholecystectomy. The liver and spleen appear normal.
Fatty replacement of the pancreas is noted. Adrenal glands and
kidneys appear normal. No hydronephrosis or renal obstruction is
noted. No renal or ureteral calculi are noted. The appendix appears
normal. There is no evidence of bowel obstruction. Uterus and
ovaries appear normal. Urinary bladder appears normal. No abnormal
fluid collection is noted. No significant adenopathy is noted.
IMPRESSION: No acute abnormality seen in the abdomen or pelvis. Fatty
replacement of the pancreas is noted.

5 mm nodule seen in left lower lobe. If the patient is at high risk
for bronchogenic carcinoma, follow-up chest CT at 6-12 months is
recommended. If the patient is at low risk for bronchogenic
carcinoma, follow-up chest CT at 12 months is recommended. This
recommendation follows the consensus statement: Guidelines for
Management of Small Pulmonary Nodules Detected on CT Scans: A
Statement from the [HOSPITAL] as published in Radiology

## 2016-06-11 ENCOUNTER — Telehealth: Payer: Self-pay | Admitting: *Deleted

## 2016-06-11 NOTE — Telephone Encounter (Signed)
Called pt to change appt due to scheduling issues but when I confirmed appt date and time pt told me that she does not have a ride so she will need to change it.  I gave her another time this week which was Thursday and she said she wanted it next week and offered next Thursday but she already had another md appt that day. She has agreed to 2/23 1:30 and I have asked Lindley Magnus to make change of tom. appt to new date 2/23.

## 2016-06-11 NOTE — Progress Notes (Deleted)
Hematology/Oncology Consult note [facility] Telephone:([phone] [phone]  Patient Care Team: Vista Mink, [place] as PCP - General (Family Medicine)   Name of the patient: Traci Davis  [id number]  [birth year]    Reason for referral- anemia   Referring physician- Dr. Lavena Bullion  Date of visit: 06/11/16   History of presenting illness- ***  ECOG PS- ***  Pain scale- ***   Review of systems- Review of Systems  Constitutional: Negative for chills, fever, malaise/fatigue and weight loss.  HENT: Negative for congestion, ear discharge and nosebleeds.   Eyes: Negative for blurred vision.  Respiratory: Negative for cough, hemoptysis, sputum production, shortness of breath and wheezing.   Cardiovascular: Negative for chest pain, palpitations, orthopnea and claudication.  Gastrointestinal: Negative for abdominal pain, blood in stool, constipation, diarrhea, heartburn, melena, nausea and vomiting.  Genitourinary: Negative for dysuria, flank pain, frequency, hematuria and urgency.  Musculoskeletal: Negative for back pain, joint pain and myalgias.  Skin: Negative for rash.  Neurological: Negative for dizziness, tingling, focal weakness, seizures, weakness and headaches.  Endo/Heme/Allergies: Does not bruise/bleed easily.  Psychiatric/Behavioral: Negative for depression and suicidal ideas. The patient does not have insomnia.     Allergies  Allergen Reactions  . Cephalexin Hives  . Meloxicam Other (See Comments)    Damage kidney  . Baclofen Other (See Comments)    "makes cerebral palsy do adverse reaction on me"  . Morphine And Related Other (See Comments)    hallucinations  . Vantin  [Cefpodoxime]     Other reaction(s): Vomiting  . Ciprofloxacin Itching and Nausea And Vomiting  . Tape Rash    Other reaction(s): Other (See Comments) Uncoded Allergy. Allergen: Tape, Other Reaction: skin tears    Patient Active Problem List   Diagnosis Date Noted  . Acute renal failure (ARF) ([place]) 02/19/2015     Past Medical History:  Diagnosis Date  . Anxiety   . Arthritis   . Asthma   . COPD (chronic obstructive pulmonary disease) ([place])   . Depression   . Diabetes mellitus without complication ([place])   . GERD (gastroesophageal reflux disease)   . Hypertension      Past Surgical History:  Procedure Laterality Date  . CESAREAN SECTION  1995  . CHOLECYSTECTOMY    . JOINT REPLACEMENT Right    both knees partial replacements dates unknown    Social History   Social History  . Marital status: Single    Spouse name: N/A  . Number of children: N/A  . Years of education: N/A   Occupational History  . Not on file.   Social History Main Topics  . Smoking status: Never Smoker  . Smokeless tobacco: Never Used  . Alcohol use No  . Drug use: No  . Sexual activity: Not on file   Other Topics Concern  . Not on file   Social History Narrative  . No narrative on file     Family History  Problem Relation Age of Onset  . CAD Father      Current Outpatient Prescriptions:  .  albuterol (PROVENTIL HFA;VENTOLIN HFA) 108 (90 BASE) MCG/ACT inhaler, Inhale 2 puffs into the lungs 4 (four) times daily as needed. For shortness of breath and/or wheezing, Disp: , Rfl:  .  ARIPiprazole (ABILIFY) 5 MG tablet, Take 5 mg by mouth daily., Disp: , Rfl:  .  CREON 36000 UNITS CPEP capsule, Take [id number] Units by mouth See admin instructions. Take 72000 units oral three times a day  with meals, and take 36000 unit orally two times a day with snack., Disp: , Rfl: 5 .  diazepam (VALIUM) 5 MG tablet, Take 5 mg by mouth every 6 (six) hours as needed for anxiety., Disp: , Rfl:  .  DULoxetine (CYMBALTA) 60 MG capsule, Take 120 mg by mouth daily. , Disp: , Rfl:  .  furosemide (LASIX) 40 MG tablet, Take 40 mg by mouth., Disp: , Rfl:  .  gabapentin (NEURONTIN) 300 MG capsule, Take 600 mg by mouth 3 (three) times daily. , Disp: , Rfl:    .  glimepiride (AMARYL) 4 MG tablet, Take 4 mg by mouth daily with breakfast., Disp: , Rfl:  .  hydrochlorothiazide (HYDRODIURIL) 25 MG tablet, Take 25 mg by mouth daily., Disp: , Rfl:  .  hydrOXYzine (VISTARIL) 50 MG capsule, Take 50 mg by mouth 3 (three) times daily as needed., Disp: , Rfl:  .  Influenza Vac Typ A&B Surf Ant (FLUVIRIN) 0.5 ML SUSY, Inject into the muscle., Disp: , Rfl:  .  lisinopril (PRINIVIL,ZESTRIL) 20 MG tablet, Take 20 mg by mouth daily., Disp: , Rfl:  .  lubiprostone (AMITIZA) 24 MCG capsule, Take 24 mcg by mouth 2 (two) times daily as needed for constipation. , Disp: , Rfl:  .  methocarbamol (ROBAXIN) 750 MG tablet, Take 750 mg by mouth 3 (three) times daily., Disp: , Rfl:  .  omeprazole (PRILOSEC) 40 MG capsule, Take 40 mg by mouth 2 (two) times daily., Disp: , Rfl:  .  oxyCODONE (OXY IR/ROXICODONE) 5 MG immediate release tablet, Take 5 mg by mouth every 8 (eight) hours as needed for moderate pain or severe pain. , Disp: , Rfl:  .  OXYCONTIN 15 MG T12A 12 hr tablet, Take 15 mg by mouth 2 (two) times daily., Disp: , Rfl:  .  pioglitazone (ACTOS) 45 MG tablet, Take 45 mg by mouth daily., Disp: , Rfl:  .  potassium chloride SA (K-DUR,KLOR-CON) 20 MEQ tablet, Take 20 mEq by mouth 2 (two) times daily., Disp: , Rfl:  .  prochlorperazine (COMPAZINE) 10 MG tablet, Take 10 mg by mouth every 6 (six) hours as needed for nausea or vomiting., Disp: , Rfl:  .  promethazine (PHENERGAN) 25 MG tablet, Take 25 mg by mouth 3 (three) times daily as needed for nausea or vomiting. , Disp: , Rfl:  .  ramelteon (ROZEREM) 8 MG tablet, Take 8 mg by mouth at bedtime., Disp: , Rfl:  .  solifenacin (VESICARE) 5 MG tablet, Take 5 mg by mouth daily., Disp: , Rfl:  .  tiZANidine (ZANAFLEX) 4 MG tablet, Take 4 mg by mouth 3 (three) times daily. , Disp: , Rfl:    Physical exam: There were no vitals filed for this visit. Physical Exam  Constitutional: She is oriented to person, place, and time and  well-developed, well-nourished, and in no distress.  HENT:  Head: Normocephalic and atraumatic.  Eyes: EOM are normal. Pupils are equal, round, and reactive to light.  Neck: Normal range of motion.  Cardiovascular: Normal rate, regular rhythm and normal heart sounds.   Pulmonary/Chest: Effort normal and breath sounds normal.  Abdominal: Soft. Bowel sounds are normal.  Neurological: She is alert and oriented to person, place, and time.  Skin: Skin is warm and dry.       CMP Latest Ref Rng & Units 02/21/2015  Glucose 65 - 99 mg/dL 168(H)  BUN 6 - 20 mg/dL 10  Creatinine 0.44 - 1.00 mg/dL 0.91  Sodium 135 -  145 mmol/L 139  Potassium 3.5 - 5.1 mmol/L 4.5  Chloride 101 - 111 mmol/L 105  CO2 22 - 32 mmol/L 25  Calcium 8.9 - 10.3 mg/dL 8.3(L)  Total Protein 6.5 - 8.1 g/dL 6.4(L)  Total Bilirubin 0.3 - 1.2 mg/dL 0.9  Alkaline Phos 38 - 126 U/L 95  AST 15 - 41 U/L 134(H)  ALT 14 - 54 U/L 88(H)   CBC Latest Ref Rng & Units 02/20/2015  WBC 3.6 - 11.0 K/uL 8.0  Hemoglobin 12.0 - 16.0 g/dL 9.5(L)  Hematocrit 35.0 - 47.0 % 29.7(L)  Platelets 150 - 440 K/uL 254    No images are attached to the encounter.  No results found.  Assessment and plan- Patient is a 46 y.o. female ***   Thank you for this kind referral and the opportunity to participate in the care of this  Patient   Visit Diagnosis No diagnosis found.  Dr. Randa Evens, MD, MPH [place] at [facility] Pager- [phone] 06/11/2016

## 2016-06-12 ENCOUNTER — Inpatient Hospital Stay: Payer: Medicare Other | Admitting: Oncology

## 2016-06-22 ENCOUNTER — Inpatient Hospital Stay: Payer: Medicare Other | Admitting: Oncology

## 2016-07-10 ENCOUNTER — Encounter: Payer: Self-pay | Admitting: Oncology

## 2016-07-10 ENCOUNTER — Inpatient Hospital Stay: Payer: Medicare Other

## 2016-07-10 ENCOUNTER — Inpatient Hospital Stay: Payer: Medicare Other | Attending: Oncology | Admitting: Oncology

## 2016-07-10 ENCOUNTER — Encounter (INDEPENDENT_AMBULATORY_CARE_PROVIDER_SITE_OTHER): Payer: Self-pay

## 2016-07-10 VITALS — BP 114/81 | HR 80 | Temp 96.6°F | Resp 18 | Ht 62.0 in | Wt 221.0 lb

## 2016-07-10 DIAGNOSIS — K648 Other hemorrhoids: Secondary | ICD-10-CM | POA: Diagnosis not present

## 2016-07-10 DIAGNOSIS — D509 Iron deficiency anemia, unspecified: Secondary | ICD-10-CM | POA: Insufficient documentation

## 2016-07-10 DIAGNOSIS — D72829 Elevated white blood cell count, unspecified: Secondary | ICD-10-CM | POA: Insufficient documentation

## 2016-07-10 DIAGNOSIS — Z993 Dependence on wheelchair: Secondary | ICD-10-CM | POA: Insufficient documentation

## 2016-07-10 DIAGNOSIS — E669 Obesity, unspecified: Secondary | ICD-10-CM | POA: Diagnosis not present

## 2016-07-10 DIAGNOSIS — R5383 Other fatigue: Secondary | ICD-10-CM | POA: Diagnosis not present

## 2016-07-10 DIAGNOSIS — D649 Anemia, unspecified: Secondary | ICD-10-CM

## 2016-07-10 DIAGNOSIS — Z79899 Other long term (current) drug therapy: Secondary | ICD-10-CM | POA: Insufficient documentation

## 2016-07-10 DIAGNOSIS — Z7722 Contact with and (suspected) exposure to environmental tobacco smoke (acute) (chronic): Secondary | ICD-10-CM | POA: Insufficient documentation

## 2016-07-10 DIAGNOSIS — G890 Central pain syndrome: Secondary | ICD-10-CM | POA: Diagnosis not present

## 2016-07-10 DIAGNOSIS — Z8719 Personal history of other diseases of the digestive system: Secondary | ICD-10-CM | POA: Insufficient documentation

## 2016-07-10 DIAGNOSIS — E119 Type 2 diabetes mellitus without complications: Secondary | ICD-10-CM | POA: Diagnosis not present

## 2016-07-10 DIAGNOSIS — R5381 Other malaise: Secondary | ICD-10-CM | POA: Insufficient documentation

## 2016-07-10 DIAGNOSIS — J449 Chronic obstructive pulmonary disease, unspecified: Secondary | ICD-10-CM | POA: Diagnosis not present

## 2016-07-10 DIAGNOSIS — G809 Cerebral palsy, unspecified: Secondary | ICD-10-CM | POA: Diagnosis not present

## 2016-07-10 DIAGNOSIS — I10 Essential (primary) hypertension: Secondary | ICD-10-CM | POA: Insufficient documentation

## 2016-07-10 DIAGNOSIS — K219 Gastro-esophageal reflux disease without esophagitis: Secondary | ICD-10-CM | POA: Insufficient documentation

## 2016-07-10 DIAGNOSIS — Z7984 Long term (current) use of oral hypoglycemic drugs: Secondary | ICD-10-CM | POA: Insufficient documentation

## 2016-07-10 LAB — CBC WITH DIFFERENTIAL/PLATELET
BASOS ABS: 0 10*3/uL (ref 0–0.1)
BASOS PCT: 1 %
Eosinophils Absolute: 0.2 10*3/uL (ref 0–0.7)
Eosinophils Relative: 2 %
HEMATOCRIT: 42.2 % (ref 35.0–47.0)
Hemoglobin: 14.1 g/dL (ref 12.0–16.0)
Lymphocytes Relative: 29 %
Lymphs Abs: 2.1 10*3/uL (ref 1.0–3.6)
MCH: 26.9 pg (ref 26.0–34.0)
MCHC: 33.3 g/dL (ref 32.0–36.0)
MCV: 80.8 fL (ref 80.0–100.0)
Monocytes Absolute: 0.3 10*3/uL (ref 0.2–0.9)
Monocytes Relative: 4 %
NEUTROS ABS: 4.8 10*3/uL (ref 1.4–6.5)
NEUTROS PCT: 64 %
Platelets: 289 10*3/uL (ref 150–440)
RBC: 5.23 MIL/uL — AB (ref 3.80–5.20)
RDW: 15.9 % — ABNORMAL HIGH (ref 11.5–14.5)
WBC: 7.4 10*3/uL (ref 3.6–11.0)

## 2016-07-10 LAB — IRON AND TIBC
IRON: 46 ug/dL (ref 28–170)
Saturation Ratios: 12 % (ref 10.4–31.8)
TIBC: 393 ug/dL (ref 250–450)
UIBC: 347 ug/dL

## 2016-07-10 LAB — RETICULOCYTES
RBC.: 5.11 MIL/uL (ref 3.80–5.20)
Retic Count, Absolute: 66.4 10*3/uL (ref 19.0–183.0)
Retic Ct Pct: 1.3 % (ref 0.4–3.1)

## 2016-07-10 LAB — FOLATE: FOLATE: 6.6 ng/mL (ref >5.9)

## 2016-07-10 LAB — VITAMIN B12: Vitamin B-12: 245 pg/mL (ref 180–914)

## 2016-07-10 LAB — FERRITIN: Ferritin: 45 ng/mL (ref 11–307)

## 2016-07-10 LAB — TSH: TSH: 2.198 u[IU]/mL (ref 0.350–4.500)

## 2016-07-10 NOTE — Progress Notes (Signed)
Referred here by Threasa Alpha NP

## 2016-07-10 NOTE — Progress Notes (Signed)
Hematology/Oncology Consult note [facility] Telephone:([phone] Fax:([phone]  Patient Care Team: Vista Mink, [place] as PCP - General (Family Medicine)   Name of the patient: Traci Davis  [id number]  [birth year]    Reason for referral- anemia   Referring physician- Beryl Meager FNP  Date of visit: 07/10/16   History of presenting illness- Patient is a 46 year old female who was seen by Dr. Bynum Bellows from hematology for iron deficiency anemia. She was found to have no evidence of celiac disease. But did have clear evidence of iron deficiency with an MCV of 73.8 and a hemoglobin of 11 point. She was also found to have leukocytosis mainly neutrophilia with a white count of 17. Flow cytometry was negative and fish for BCR able was also negative for CML. CBC from 09/27/2015 showed white count 12.7, H&H of 12.2/38.3 and a platelet count of 352.  Patient's last colonoscopy was in 2008 which showed internal nonbleeding mild hemorrhoids and no other abnormality at that time. Patient has been a lifetime nonsmoker but has been a to secondhand smoking and has been diagnosed with copd secondary to that   ECOG PS- 2  Pain scale- 0   Review of systems- Review of Systems  Constitutional: Positive for malaise/fatigue. Negative for chills, fever and weight loss.  HENT: Negative for congestion, ear discharge and nosebleeds.   Eyes: Negative for blurred vision.  Respiratory: Negative for cough, hemoptysis, sputum production, shortness of breath and wheezing.   Cardiovascular: Negative for chest pain, palpitations, orthopnea and claudication.  Gastrointestinal: Negative for abdominal pain, blood in stool, constipation, diarrhea, heartburn, melena, nausea and vomiting.  Genitourinary: Negative for dysuria, flank pain, frequency, hematuria and urgency.  Musculoskeletal: Negative for back pain, joint pain and myalgias.  Skin: Negative for rash.    Neurological: Negative for dizziness, tingling, focal weakness, seizures, weakness and headaches.  Endo/Heme/Allergies: Does not bruise/bleed easily.       Cold intolerance  Psychiatric/Behavioral: Negative for depression and suicidal ideas. The patient does not have insomnia.     Allergies  Allergen Reactions  . Cephalexin Hives  . Meloxicam Other (See Comments)    Damage kidney  . Baclofen Other (See Comments)    "makes cerebral palsy do adverse reaction on me"  . Morphine And Related Other (See Comments)    hallucinations  . Vantin  [Cefpodoxime]     Other reaction(s): Vomiting  . Ciprofloxacin Itching and Nausea And Vomiting  . Tape Rash    Other reaction(s): Other (See Comments) Uncoded Allergy. Allergen: Tape, Other Reaction: skin tears    Patient Active Problem List   Diagnosis Date Noted  . Acute renal failure (ARF) ([facility]) 02/19/2015     Past Medical History:  Diagnosis Date  . Anxiety   . Arthritis   . Asthma   . Cerebral palsy ([place])   . COPD (chronic obstructive pulmonary disease) ([place])   . Depression   . Diabetes mellitus without complication ([place])   . GERD (gastroesophageal reflux disease)   . Hypertension      Past Surgical History:  Procedure Laterality Date  . CESAREAN SECTION  1995  . CHOLECYSTECTOMY    . JOINT REPLACEMENT Right    both knees partial replacements dates unknown    Social History   Social History  . Marital status: Single    Spouse name: N/A  . Number of children: N/A  . Years of education: N/A   Occupational History  . Not on file.  Social History Main Topics  . Smoking status: Never Smoker  . Smokeless tobacco: Never Used  . Alcohol use No  . Drug use: No  . Sexual activity: Yes   Other Topics Concern  . Not on file   Social History Narrative  . No narrative on file     Family History  Problem Relation Age of Onset  . CAD Father   . Heart attack Brother      Current Outpatient Prescriptions:  .   albuterol (PROVENTIL HFA;VENTOLIN HFA) 108 (90 BASE) MCG/ACT inhaler, Inhale 2 puffs into the lungs 4 (four) times daily as needed. For shortness of breath and/or wheezing, Disp: , Rfl:  .  ARIPiprazole (ABILIFY) 5 MG tablet, Take 5 mg by mouth daily., Disp: , Rfl:  .  Blood Glucose Monitoring Suppl (GLUCOCOM BLOOD GLUCOSE MONITOR) DEVI, Test daily before all meals/snacks and once before bedtime., Disp: , Rfl:  .  budesonide-formoterol (SYMBICORT) 160-4.5 MCG/ACT inhaler, 2 PUMP(S) INHALATION TWO TIMES A DAY, Disp: , Rfl:  .  CREON 36000 UNITS CPEP capsule, Take 64,403-47,425 Units by mouth See admin instructions. Take 72000 units oral three times a day with meals, and take 36000 unit orally two times a day with snack., Disp: , Rfl: 5 .  DULoxetine (CYMBALTA) 60 MG capsule, Take 120 mg by mouth daily. , Disp: , Rfl:  .  furosemide (LASIX) 40 MG tablet, Take 40 mg by mouth., Disp: , Rfl:  .  gabapentin (NEURONTIN) 300 MG capsule, Take 600 mg by mouth 3 (three) times daily. , Disp: , Rfl:  .  glimepiride (AMARYL) 4 MG tablet, Take 4 mg by mouth daily with breakfast., Disp: , Rfl:  .  glucose blood (ONE TOUCH ULTRA TEST) test strip, USE TO TEST BLOOD SUGAR TWO TIMES A DAY, Disp: , Rfl:  .  hydrochlorothiazide (HYDRODIURIL) 25 MG tablet, Take 25 mg by mouth daily., Disp: , Rfl:  .  incobotulinumtoxinA (XEOMIN) 100 units SOLR injection, Inject into the muscle., Disp: , Rfl:  .  Lancets (FREESTYLE) lancets, Test daily before all meals/snacks, Disp: , Rfl:  .  lisinopril (PRINIVIL,ZESTRIL) 20 MG tablet, Take 20 mg by mouth daily., Disp: , Rfl:  .  lubiprostone (AMITIZA) 24 MCG capsule, Take 24 mcg by mouth 2 (two) times daily as needed for constipation. , Disp: , Rfl:  .  metFORMIN (GLUCOPHAGE) 500 MG tablet, Take by mouth., Disp: , Rfl:  .  methocarbamol (ROBAXIN) 750 MG tablet, Take 750 mg by mouth 3 (three) times daily., Disp: , Rfl:  .  mometasone-formoterol (DULERA) 200-5 MCG/ACT AERO, Frequency:PRN    Dosage:0.0     Instructions:  Note:Dose: 200-5 MCG, Disp: , Rfl:  .  naloxone (NARCAN) nasal spray 4 mg/0.1 mL, One spray in either nostril once for known/suspected opioid overdose. May repeat every 2-3 minutes in alternating nostril til EMS arrives, Disp: , Rfl:  .  nystatin (MYCOSTATIN/NYSTOP) powder, 1 application daily., Disp: , Rfl:  .  omeprazole (PRILOSEC) 40 MG capsule, Take 40 mg by mouth 2 (two) times daily., Disp: , Rfl:  .  oxyCODONE (OXY IR/ROXICODONE) 5 MG immediate release tablet, Take 5 mg by mouth every 8 (eight) hours as needed for moderate pain or severe pain. , Disp: , Rfl:  .  pioglitazone (ACTOS) 45 MG tablet, Take 45 mg by mouth daily., Disp: , Rfl:  .  polyethylene glycol (MIRALAX / GLYCOLAX) packet, Use 17 gram or 1 capful in 8 oz of fluid of your choice three times  a day until having soft stools, then back down to once a day., Disp: , Rfl:  .  potassium chloride SA (K-DUR,KLOR-CON) 20 MEQ tablet, Take 20 mEq by mouth 2 (two) times daily., Disp: , Rfl:  .  pregabalin (LYRICA) 50 MG capsule, Take 50 mg by mouth., Disp: , Rfl:  .  promethazine (PHENERGAN) 25 MG tablet, Take 25 mg by mouth 3 (three) times daily as needed for nausea or vomiting. , Disp: , Rfl:  .  solifenacin (VESICARE) 5 MG tablet, Take 5 mg by mouth daily., Disp: , Rfl:  .  tiotropium (SPIRIVA HANDIHALER) 18 MCG inhalation capsule, Place into inhaler and inhale., Disp: , Rfl:  .  topiramate (TOPAMAX) 25 MG tablet, , Disp: , Rfl:  .  Influenza Vac Typ A&B Surf Ant (FLUVIRIN) 0.5 ML SUSY, Inject into the muscle., Disp: , Rfl:    Physical exam:  Vitals:   07/10/16 1341  BP: 114/81  Pulse: 80  Resp: 18  Temp: (!) 96.6 F (35.9 C)  TempSrc: Tympanic  Weight: 221 lb (100.2 kg)  Height: 5\' 2"  (1.575 m)   Physical Exam  Constitutional: She is oriented to person, place, and time.  She is obese and sitting in a wheelchair secondary to cerebral palsy. LLE has a brace in place and she cannot ambulate at  baseline  HENT:  Head: Normocephalic and atraumatic.  Eyes: EOM are normal. Pupils are equal, round, and reactive to light.  Neck: Normal range of motion.  Cardiovascular: Normal rate, regular rhythm and normal heart sounds.   Pulmonary/Chest: Effort normal and breath sounds normal.  Abdominal: Soft. Bowel sounds are normal.  Neurological: She is alert and oriented to person, place, and time.  Skin: Skin is warm and dry.       CMP Latest Ref Rng & Units 02/21/2015  Glucose 65 - 99 mg/dL 168(H)  BUN 6 - 20 mg/dL 10  Creatinine 0.44 - 1.00 mg/dL 0.91  Sodium 135 - 145 mmol/L 139  Potassium 3.5 - 5.1 mmol/L 4.5  Chloride 101 - 111 mmol/L 105  CO2 22 - 32 mmol/L 25  Calcium 8.9 - 10.3 mg/dL 8.3(L)  Total Protein 6.5 - 8.1 g/dL 6.4(L)  Total Bilirubin 0.3 - 1.2 mg/dL 0.9  Alkaline Phos 38 - 126 U/L 95  AST 15 - 41 U/L 134(H)  ALT 14 - 54 U/L 88(H)   CBC Latest Ref Rng & Units 02/20/2015  WBC 3.6 - 11.0 K/uL 8.0  Hemoglobin 12.0 - 16.0 g/dL 9.5(L)  Hematocrit 35.0 - 47.0 % 29.7(L)  Platelets 150 - 440 K/uL 254     Assessment and plan- Patient is a 46 y.o. female who presents for continuity of care for her iron deficiency anemia  Today I will do a complete anemia workup including CBC, iron studies, B12, folate, TSH, reticulocyte count and haptoglobin as well as myeloma panel. I will see the patient back in 3 weeks' time to discuss the results of her blood work and further workup if need be   Thank you for this kind referral and the opportunity to participate in the care of this patient   Visit Diagnosis 1. Normocytic anemia     Dr. Randa Evens, MD, MPH [place] at [facility] Pager- [phone] 07/10/2016 4:20 PM

## 2016-07-11 LAB — HAPTOGLOBIN: Haptoglobin: 330 mg/dL — ABNORMAL HIGH (ref 34–200)

## 2016-07-13 LAB — MULTIPLE MYELOMA PANEL, SERUM
ALBUMIN SERPL ELPH-MCNC: 3.3 g/dL (ref 2.9–4.4)
ALPHA 1: 0.3 g/dL (ref 0.0–0.4)
ALPHA2 GLOB SERPL ELPH-MCNC: 1.1 g/dL — AB (ref 0.4–1.0)
Albumin/Glob SerPl: 1 (ref 0.7–1.7)
B-Globulin SerPl Elph-Mcnc: 1.2 g/dL (ref 0.7–1.3)
Gamma Glob SerPl Elph-Mcnc: 0.9 g/dL (ref 0.4–1.8)
Globulin, Total: 3.4 g/dL (ref 2.2–3.9)
IgA: 173 mg/dL (ref 87–352)
IgG (Immunoglobin G), Serum: 929 mg/dL (ref 700–1600)
IgM, Serum: 98 mg/dL (ref 26–217)
TOTAL PROTEIN ELP: 6.7 g/dL (ref 6.0–8.5)

## 2016-07-30 ENCOUNTER — Ambulatory Visit: Payer: Medicare Other | Attending: Family Medicine | Admitting: Physical Therapy

## 2016-07-30 ENCOUNTER — Encounter: Payer: Self-pay | Admitting: Physical Therapy

## 2016-07-30 DIAGNOSIS — M6281 Muscle weakness (generalized): Secondary | ICD-10-CM

## 2016-07-30 DIAGNOSIS — M25551 Pain in right hip: Secondary | ICD-10-CM | POA: Insufficient documentation

## 2016-07-30 DIAGNOSIS — M25552 Pain in left hip: Secondary | ICD-10-CM | POA: Insufficient documentation

## 2016-07-30 DIAGNOSIS — R262 Difficulty in walking, not elsewhere classified: Secondary | ICD-10-CM | POA: Insufficient documentation

## 2016-07-31 ENCOUNTER — Encounter: Payer: Self-pay | Admitting: Oncology

## 2016-07-31 ENCOUNTER — Inpatient Hospital Stay: Payer: Medicare Other | Attending: Oncology | Admitting: Oncology

## 2016-07-31 VITALS — BP 117/77 | HR 76 | Temp 97.0°F | Resp 18 | Wt 200.5 lb

## 2016-07-31 DIAGNOSIS — Z7722 Contact with and (suspected) exposure to environmental tobacco smoke (acute) (chronic): Secondary | ICD-10-CM | POA: Diagnosis not present

## 2016-07-31 DIAGNOSIS — K219 Gastro-esophageal reflux disease without esophagitis: Secondary | ICD-10-CM | POA: Diagnosis not present

## 2016-07-31 DIAGNOSIS — Z79899 Other long term (current) drug therapy: Secondary | ICD-10-CM

## 2016-07-31 DIAGNOSIS — G809 Cerebral palsy, unspecified: Secondary | ICD-10-CM

## 2016-07-31 DIAGNOSIS — J449 Chronic obstructive pulmonary disease, unspecified: Secondary | ICD-10-CM | POA: Diagnosis not present

## 2016-07-31 DIAGNOSIS — K648 Other hemorrhoids: Secondary | ICD-10-CM

## 2016-07-31 DIAGNOSIS — D72829 Elevated white blood cell count, unspecified: Secondary | ICD-10-CM | POA: Diagnosis not present

## 2016-07-31 DIAGNOSIS — Z8719 Personal history of other diseases of the digestive system: Secondary | ICD-10-CM

## 2016-07-31 DIAGNOSIS — D509 Iron deficiency anemia, unspecified: Secondary | ICD-10-CM | POA: Diagnosis present

## 2016-07-31 DIAGNOSIS — I10 Essential (primary) hypertension: Secondary | ICD-10-CM | POA: Diagnosis not present

## 2016-07-31 DIAGNOSIS — Z7984 Long term (current) use of oral hypoglycemic drugs: Secondary | ICD-10-CM

## 2016-07-31 DIAGNOSIS — M549 Dorsalgia, unspecified: Secondary | ICD-10-CM

## 2016-07-31 DIAGNOSIS — E119 Type 2 diabetes mellitus without complications: Secondary | ICD-10-CM | POA: Diagnosis not present

## 2016-07-31 NOTE — Progress Notes (Signed)
Hematology/Oncology Consult note [facility]  Telephone:([phone] [phone]  Patient Care Team: Vista Mink, [place] as PCP - General (Family Medicine)   Name of the patient: Traci Davis  [id number]  03-01-1971   Date of visit: 07/31/16  Diagnosis- iron deficiency anemia in the past. Now resolved  Chief complaint/ Reason for visit- discuss results of bloodwork  Heme/Onc history: Patient is a 46 year old female who was seen by Dr. Bynum Bellows from hematology for iron deficiency anemia. She was found to have no evidence of celiac disease. But did have clear evidence of iron deficiency with an MCV of 73.8 and a hemoglobin of 11 point. She was also found to have leukocytosis mainly neutrophilia with a white count of 17. Flow cytometry was negative and fish for BCR able was also negative for CML. CBC from 09/27/2015 showed white count 12.7, H&H of 12.2/38.3 and a platelet count of 352.  Patient's last colonoscopy was in 2008 which showed internal nonbleeding mild hemorrhoids and no other abnormality at that time. Patient has been a lifetime nonsmoker but has been a to secondhand smoking and has been diagnosed with copd secondary to that  Results of bloodwork from 07/10/2016 were as follows: CBC showed white count of 7.4, H&H of 14.1/42.2 and a platelet count of 289. Ferritin was normal at 45 and iron studies were normal. B12 and folate were within normal limits. TSH was normal. Reticulocyte count was normal and myeloma panel did not reveal any monoclonal protein  Interval history- reports feeling cold most of the times. Denies other complaints  ECOG PS- 2 due to cerebral palsy Pain scale- 4- back pain   Review of systems- Review of Systems  Constitutional: Negative for chills, fever, malaise/fatigue and weight loss.  HENT: Negative for congestion, ear discharge and nosebleeds.   Eyes: Negative for blurred vision.  Respiratory: Negative  for cough, hemoptysis, sputum production, shortness of breath and wheezing.   Cardiovascular: Negative for chest pain, palpitations, orthopnea and claudication.  Gastrointestinal: Negative for abdominal pain, blood in stool, constipation, diarrhea, heartburn, melena, nausea and vomiting.  Genitourinary: Negative for dysuria, flank pain, frequency, hematuria and urgency.  Musculoskeletal: Negative for back pain, joint pain and myalgias.  Skin: Negative for rash.  Neurological: Negative for dizziness, tingling, focal weakness, seizures, weakness and headaches.  Endo/Heme/Allergies: Does not bruise/bleed easily.  Psychiatric/Behavioral: Negative for depression and suicidal ideas. The patient does not have insomnia.      Current treatment- observation  Allergies  Allergen Reactions  . Cephalexin Hives  . Meloxicam Other (See Comments)    Damage kidney  . Baclofen Other (See Comments)    "makes cerebral palsy do adverse reaction on me"  . Morphine And Related Other (See Comments)    hallucinations  . Vantin  [Cefpodoxime]     Other reaction(s): Vomiting  . Ciprofloxacin Itching and Nausea And Vomiting  . Tape Rash    Other reaction(s): Other (See Comments) Uncoded Allergy. Allergen: Tape, Other Reaction: skin tears     Past Medical History:  Diagnosis Date  . Anxiety   . Arthritis   . Asthma   . Cerebral palsy ([place])   . COPD (chronic obstructive pulmonary disease) ([place])   . Depression   . Diabetes mellitus without complication ([place])   . GERD (gastroesophageal reflux disease)   . Hypertension      Past Surgical History:  Procedure Laterality Date  . CESAREAN SECTION  1995  . CHOLECYSTECTOMY    .  FOOT CAPSULE RELEASE W/ PERCUTANEOUS HEEL CORD LENGTHENING, TIBIAL TENDON TRANSFER     age 21 ,and 2010-both legs  . JOINT REPLACEMENT Right    both knees partial replacements dates unknown  . knee replacment     x 5 per pt. last one- left knee 2014. has had 2 on R 3 on L     Social History   Social History  . Marital status: Single    Spouse name: N/A  . Number of children: N/A  . Years of education: N/A   Occupational History  . Not on file.   Social History Main Topics  . Smoking status: Never Smoker  . Smokeless tobacco: Never Used  . Alcohol use No  . Drug use: No  . Sexual activity: Yes   Other Topics Concern  . Not on file   Social History Narrative  . No narrative on file    Family History  Problem Relation Age of Onset  . CAD Father   . Heart attack Brother      Current Outpatient Prescriptions:  .  albuterol (PROVENTIL HFA;VENTOLIN HFA) 108 (90 BASE) MCG/ACT inhaler, Inhale 2 puffs into the lungs 4 (four) times daily as needed. For shortness of breath and/or wheezing, Disp: , Rfl:  .  ARIPiprazole (ABILIFY) 5 MG tablet, Take 5 mg by mouth daily., Disp: , Rfl:  .  Blood Glucose Monitoring Suppl (GLUCOCOM BLOOD GLUCOSE MONITOR) DEVI, Test daily before all meals/snacks and once before bedtime., Disp: , Rfl:  .  budesonide-formoterol (SYMBICORT) 160-4.5 MCG/ACT inhaler, 2 PUMP(S) INHALATION TWO TIMES A DAY, Disp: , Rfl:  .  CREON 36000 UNITS CPEP capsule, Take 91,694-50,[id number] Units by mouth See admin instructions. Take 72000 units oral three times a day with meals, and take 36000 unit orally two times a day with snack., Disp: , Rfl: 5 .  DULoxetine (CYMBALTA) 60 MG capsule, Take 120 mg by mouth daily. , Disp: , Rfl:  .  furosemide (LASIX) 40 MG tablet, Take 40 mg by mouth., Disp: , Rfl:  .  gabapentin (NEURONTIN) 300 MG capsule, Take 600 mg by mouth 3 (three) times daily. , Disp: , Rfl:  .  glimepiride (AMARYL) 4 MG tablet, Take 4 mg by mouth daily with breakfast., Disp: , Rfl:  .  glucose blood (ONE TOUCH ULTRA TEST) test strip, USE TO TEST BLOOD SUGAR TWO TIMES A DAY, Disp: , Rfl:  .  hydrochlorothiazide (HYDRODIURIL) 25 MG tablet, Take 25 mg by mouth daily., Disp: , Rfl:  .  incobotulinumtoxinA (XEOMIN) 100 units SOLR injection,  Inject into the muscle., Disp: , Rfl:  .  Influenza Vac Typ A&B Surf Ant (FLUVIRIN) 0.5 ML SUSY, Inject into the muscle., Disp: , Rfl:  .  Lancets (FREESTYLE) lancets, Test daily before all meals/snacks, Disp: , Rfl:  .  lisinopril (PRINIVIL,ZESTRIL) 20 MG tablet, Take 20 mg by mouth daily., Disp: , Rfl:  .  lubiprostone (AMITIZA) 24 MCG capsule, Take 24 mcg by mouth 2 (two) times daily as needed for constipation. , Disp: , Rfl:  .  metFORMIN (GLUCOPHAGE) 500 MG tablet, Take by mouth., Disp: , Rfl:  .  methocarbamol (ROBAXIN) 750 MG tablet, Take 750 mg by mouth 3 (three) times daily., Disp: , Rfl:  .  mometasone-formoterol (DULERA) 200-5 MCG/ACT AERO, Frequency:PRN   Dosage:0.0     Instructions:  Note:Dose: 200-5 MCG, Disp: , Rfl:  .  naloxone (NARCAN) nasal spray 4 mg/0.1 mL, One spray in either nostril once for known/suspected opioid  overdose. May repeat every 2-3 minutes in alternating nostril til EMS arrives, Disp: , Rfl:  .  nystatin (MYCOSTATIN/NYSTOP) powder, 1 application daily., Disp: , Rfl:  .  omeprazole (PRILOSEC) 40 MG capsule, Take 40 mg by mouth 2 (two) times daily., Disp: , Rfl:  .  oxyCODONE (OXY IR/ROXICODONE) 5 MG immediate release tablet, Take 5 mg by mouth every 8 (eight) hours as needed for moderate pain or severe pain. , Disp: , Rfl:  .  pioglitazone (ACTOS) 45 MG tablet, Take 45 mg by mouth daily., Disp: , Rfl:  .  polyethylene glycol (MIRALAX / GLYCOLAX) packet, Use 17 gram or 1 capful in 8 oz of fluid of your choice three times a day until having soft stools, then back down to once a day., Disp: , Rfl:  .  potassium chloride SA (K-DUR,KLOR-CON) 20 MEQ tablet, Take 20 mEq by mouth 2 (two) times daily., Disp: , Rfl:  .  pregabalin (LYRICA) 50 MG capsule, Take 50 mg by mouth., Disp: , Rfl:  .  promethazine (PHENERGAN) 25 MG tablet, Take 25 mg by mouth 3 (three) times daily as needed for nausea or vomiting. , Disp: , Rfl:  .  solifenacin (VESICARE) 5 MG tablet, Take 5 mg by  mouth daily., Disp: , Rfl:  .  tiotropium (SPIRIVA HANDIHALER) 18 MCG inhalation capsule, Place into inhaler and inhale., Disp: , Rfl:  .  topiramate (TOPAMAX) 100 MG tablet, Take 100 mg by mouth daily., Disp: , Rfl:   Physical exam:  Vitals:   07/31/16 1122  BP: 117/77  Pulse: 76  Resp: 18  Temp: 97 F (36.1 C)  TempSrc: Tympanic  Weight: 200 lb 8 oz (90.9 kg)   Physical Exam  Constitutional: She is oriented to person, place, and time and well-developed, well-nourished, and in no distress.  She is sitting in a wheelchair and brace over LLE due to cerebral palsy  HENT:  Head: Normocephalic and atraumatic.  Eyes: EOM are normal. Pupils are equal, round, and reactive to light.  Neck: Normal range of motion.  Cardiovascular: Normal rate, regular rhythm and normal heart sounds.   Pulmonary/Chest: Effort normal and breath sounds normal.  Abdominal: Soft. Bowel sounds are normal.  Neurological: She is alert and oriented to person, place, and time.  Skin: Skin is warm and dry.     CMP Latest Ref Rng & Units 02/21/2015  Glucose 65 - 99 mg/dL 168(H)  BUN 6 - 20 mg/dL 10  Creatinine 0.44 - 1.00 mg/dL 0.91  Sodium 135 - 145 mmol/L 139  Potassium 3.5 - 5.1 mmol/L 4.5  Chloride 101 - 111 mmol/L 105  CO2 22 - 32 mmol/L 25  Calcium 8.9 - 10.3 mg/dL 8.3(L)  Total Protein 6.5 - 8.1 g/dL 6.4(L)  Total Bilirubin 0.3 - 1.2 mg/dL 0.9  Alkaline Phos 38 - 126 U/L 95  AST 15 - 41 U/L 134(H)  ALT 14 - 54 U/L 88(H)   CBC Latest Ref Rng & Units 07/10/2016  WBC 3.6 - 11.0 K/uL 7.4  Hemoglobin 12.0 - 16.0 g/dL 14.1  Hematocrit 35.0 - 47.0 % 42.2  Platelets 150 - 440 K/uL 289    No images are attached to the encounter.  No results found.   Assessment and plan- Patient is a 46 y.o. female referred to [place] for anemia  Results of her blood work show that her anemia and leucocytosis has resolved. No evidence of iron deficiency. I will see her back in 4 months with repeat cbc  and if it is normal  she can continue to f/u with her pCP. She does have elevated blood sugars which needs f/u by PCP   Visit Diagnosis 1. Iron deficiency anemia, unspecified iron deficiency anemia type      Dr. Randa Evens, MD, MPH [facility] Pager- [phone] 07/31/2016 12:55 PM

## 2016-07-31 NOTE — Progress Notes (Signed)
Patient complains of bilateral hip pain.  States she started back to PT yesterday.  Otherwise, no complaints.

## 2016-07-31 NOTE — Therapy (Signed)
[facility] [address] [place], [address] Phone: [phone]   Fax:  [phone]  Physical Therapy Evaluation  Patient Details  Name: Traci Davis MRN: [id number] Date of Birth: July 01, 1970 Referring Provider: Remi Haggard. FNP  Encounter Date: 07/30/2016      PT End of Session - 07/30/16 1625    Visit Number 1   Number of Visits 12   Date for PT Re-Evaluation 10/22/16   Authorization Type 1   Authorization Time Period 10 (G Code)   PT Start Time 1535   PT Stop Time 1627   PT Time Calculation (min) 52 min   Activity Tolerance Patient tolerated treatment well;Patient limited by pain   Behavior During Therapy WFL for tasks assessed/performed      Past Medical History:  Diagnosis Date  . Anxiety   . Arthritis   . Asthma   . Cerebral palsy ([place])   . COPD (chronic obstructive pulmonary disease) ([place])   . Depression   . Diabetes mellitus without complication ([place])   . GERD (gastroesophageal reflux disease)   . Hypertension     Past Surgical History:  Procedure Laterality Date  . CESAREAN SECTION  1995  . CHOLECYSTECTOMY    . FOOT CAPSULE RELEASE W/ PERCUTANEOUS HEEL CORD LENGTHENING, TIBIAL TENDON TRANSFER     age 46 ,and 2010-both legs  . JOINT REPLACEMENT Right    both knees partial replacements dates unknown  . knee replacment     x 5 per pt. last one- left knee 2014. has had 2 on R 3 on L    There were no vitals filed for this visit.       Subjective Assessment - 07/30/16 1553    Subjective Patient reports she is having pain in right hip and left hip with worsening symptoms over the past month. She is unable to walk or stand as much as she would like and is unable to perform household chores without severe difficulty.    Pertinent History Patient reports she has had problems with walking for years with worsening symptoms in knees and difficulty with walking and she underwent  bilateral partial knee replacements to help with walking. Since her knee surgeries she has had increasing pain in left knee and has recently had to have a brace made to assist with left LE support. She is taking medication for muscle spasims to assist with decreasing tone in left LE with good results.States recent diagnosis of COPD.   Limitations Walking;Standing;House hold activities   How long can you stand comfortably? 5 min. due to right hip pain   How long can you walk comfortably? using walker 100 feet   Patient Stated Goals Patient would like to decrease hip pain and improve strength in order to walk more with less difficulty   Currently in Pain? Yes   Pain Score 7   worst 9/10 (uses heat to decrease pain), best 4/10 (with medication, heat, rest)   Pain Location Hip   Pain Orientation Right   Pain Descriptors / Indicators Tender;Sore   Pain Type Chronic pain   Pain Onset More than a month ago   Pain Frequency Constant   Aggravating Factors  walking, standing, moving right hip   Pain Relieving Factors medication, rest   Effect of Pain on Daily Activities increased difficulty with all daily chores including standing and walking due to pain, stiffness, weakness  [facility] PT Assessment - 07/30/16 1548      Assessment   Medical Diagnosis General Deconditioning, Spastic Diplegia cerebral palsy (G80.1)    Referring Provider Threasa Alpha P. FNP   Onset Date/Surgical Date 06/29/15   Hand Dominance Right   Next MD Visit unknown   Prior Therapy yes     Precautions   Precautions None     Restrictions   Weight Bearing Restrictions No     Balance Screen   Has the patient fallen in the past 6 months No   Has the patient had a decrease in activity level because of a fear of falling?  No   Is the patient reluctant to leave their home because of a fear of falling?  No     Home Environment   Living Environment Private residence   Living Arrangements Children   Type of  [place] - 2 wheels;Walker - standard;Wheelchair - power;Bedside commode;Tub bench;Toilet riser;Hand held shower head     Prior Function   Level of Independence Independent with gait;Independent with transfers;Independent with household mobility with device   Leisure Spending time with family outside.     Cognition   Overall Cognitive Status Within Functional Limits for tasks assessed       Objective: Observation: patient arrived to clinic requiring wheelchair to enter clinic, required min assist to transfer wheelchair to car Lucianne Lei) Posture: sitting in chair with KAFO left LE, locked in extension for transfers/ambulation Transfers; wheelchair to treatment table sit to stand, pivot to right with close supervision/contact guard for safety, sit <> supine with min. assist x 1  Measurement:  R Hip AROM/MMT: Flexion:90, ABD: 20,  ER:0, IR:0,  L Hip AROM/MMT: Flexion:70, ABD: 30, ER:0, IR:0 L Knee AROM/MMT: Flexion: N/T, Extension:-20 R Knee AROM/MMT: Flexion:110, Extension:0 Core control/strength decreased as noted with transfers supine to sit Hamstring Length: 40 degree bilaterally  Tone; increased tone bilateral LE's throughout  Outcome Measures:  LEFS: 10/80 (severe self perceived disability)          PT Long Term Goals - 07/30/16 1630      PT LONG TERM GOAL #1   Title Patient will demonstrate ability to walk 75 ft with walker with improved weight shift, by 10/22/16 to demonstrate functional improvement in ability to ambulate   Baseline Ambulates short distances with difficulty using walker with hip pain and fatigue   Status New     PT LONG TERM GOAL #2   Title Patient will improve ROM in both hips with SLR to 60 degrees by 10/22/16 for improved mobility with transfers and walking with improved gait pattern.   Baseline decreased Hamstring length with SLR to 40 degrees bilaterally  (increased tone)   Status New     PT LONG TERM GOAL #3   Title Patient will demonstrate a LEFS score of 35/80 by 10/22/16 to show significant improvement and LE functions indicating improved ability to perform standing and walking activties   Baseline LEFS: 10/80 (Severe self perceived dysfunction)   Status New     PT LONG TERM GOAL #4   Title Patient will be independent with self management of select exercises and control of tone with home program and family assistance as needed by 10/22/2016   Baseline limited knowledge and requires assistance to perform all exercises with VC   Status New  Plan - 07/30/16 1621    Clinical Impression Statement Patient is a 46 year old female who presents with exacerbation of hip pain and weakness in core/LE's due to fall approximately 6 months ago. She has limitations of increased tone due to Cerebral palsy and decreased ROM and strength in core and LE's and UE's. She is limited in functional ambulation and is using a walker at home and wheelchair in the community due to decreased strength and endurance. Her LEFS score is 10/80 indicating severe self perceived disability. She will benefit from physical therapy interveniton to address limitations and improve funciton with walking, daily activties with less difficulty.    Rehab Potential Fair   Clinical Impairments Affecting Rehab Potential (+) motivated, age, previous good results with therapy intervention   (-) chronic condition/pain, multiple surgeries both LE's, significant hypertonicity/spasticity, COPD   PT Frequency 1x / week   PT Duration 12 weeks   PT Treatment/Interventions Gait training;Manual techniques;Therapeutic exercise;Electrical Stimulation;Moist Heat;Patient/family education;Passive range of motion;Ultrasound;Therapeutic activities;Energy conservation;Neuromuscular re-education;Balance training   PT Next Visit Plan flexiblity and strengthening exercises with assistance,  estim./modalities as needed for pain control   PT Home Exercise Plan ROM exercises for LE's to improve flexibility   Consulted and Agree with Plan of Care Patient      Patient will benefit from skilled therapeutic intervention in order to improve the following deficits and impairments:  Decreased strength, Difficulty walking, Impaired flexibility, Decreased mobility, Decreased range of motion, Pain, Impaired tone, Decreased endurance  Visit Diagnosis: Muscle weakness (generalized) - Plan: PT plan of care cert/re-cert  Difficulty in walking, not elsewhere classified - Plan: PT plan of care cert/re-cert  Pain in right hip - Plan: PT plan of care cert/re-cert  Pain in left hip - Plan: PT plan of care cert/re-cert      G-Codes - 35/57/32 1630    Functional Assessment Tool Used (Outpatient Only) LEFS, clinical judgment, ROM and strength deficits, pain scale    Functional Limitation Mobility: Walking and moving around   Mobility: Walking and Moving Around Current Status (K0254) At least 40 percent but less than 60 percent impaired, limited or restricted   Mobility: Walking and Moving Around Goal Status [phone]) At least 20 percent but less than 40 percent impaired, limited or restricted       Problem List Patient Active Problem List   Diagnosis Date Noted  . Chronic, continuous use of opioids 07/01/2015  . Chronic midline low back pain without sciatica 03/21/2015  . Acute renal failure (ARF) ([place]) 02/19/2015  . Type 2 diabetes mellitus without complication ([place]) 37/62/8315  . Cerebral palsy ([place]) 01/05/2014  . 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 Hip pain, chronic 04/28/2013  . Essential hypertension 01/10/2013  . Esophageal reflux 08/12/2012  . Primary localized osteoarthrosis, lower leg 05/07/2012    Jomarie Longs PT 07/31/2016, 10:01 PM  [facility] [address], [place],  [address] Phone: [phone]   Fax:  [phone]  Name: LATISA BELAY MRN: [id number] Date of Birth: 02-May-1970

## 2016-08-02 ENCOUNTER — Ambulatory Visit: Payer: Medicare Other | Admitting: Physical Therapy

## 2016-08-06 ENCOUNTER — Ambulatory Visit: Payer: Medicare Other | Admitting: Physical Therapy

## 2016-08-06 ENCOUNTER — Encounter: Payer: Self-pay | Admitting: Physical Therapy

## 2016-08-06 DIAGNOSIS — R262 Difficulty in walking, not elsewhere classified: Secondary | ICD-10-CM

## 2016-08-06 DIAGNOSIS — M6281 Muscle weakness (generalized): Secondary | ICD-10-CM | POA: Diagnosis not present

## 2016-08-06 DIAGNOSIS — M25551 Pain in right hip: Secondary | ICD-10-CM

## 2016-08-06 DIAGNOSIS — M25552 Pain in left hip: Secondary | ICD-10-CM

## 2016-08-06 NOTE — Therapy (Signed)
[facility] [address] [place], [address] Phone: [phone]   Fax:  [phone]  Physical Therapy Treatment  Patient Details  Name: Traci Davis MRN: [id number] Date of Birth: [birth year] Referring Provider: Remi Haggard. FNP  Encounter Date: 08/06/2016      PT End of Session - 08/06/16 1048    Visit Number 2   Number of Visits 12   Date for PT Re-Evaluation 10/22/16   Authorization Type 2   Authorization Time Period 10 (G Code)   PT Start Time 1037   PT Stop Time 1120   PT Time Calculation (min) 43 min   Activity Tolerance Patient tolerated treatment well;Patient limited by pain   Behavior During Therapy WFL for tasks assessed/performed      Past Medical History:  Diagnosis Date  . Anxiety   . Arthritis   . Asthma   . Cerebral palsy ([place])   . COPD (chronic obstructive pulmonary disease) ([place])   . Depression   . Diabetes mellitus without complication ([place])   . GERD (gastroesophageal reflux disease)   . Hypertension     Past Surgical History:  Procedure Laterality Date  . CESAREAN SECTION  1995  . CHOLECYSTECTOMY    . FOOT CAPSULE RELEASE W/ PERCUTANEOUS HEEL CORD LENGTHENING, TIBIAL TENDON TRANSFER     age 46 ,and 2010-both legs  . JOINT REPLACEMENT Right    both knees partial replacements dates unknown  . knee replacment     x 5 per pt. last one- left knee 2014. has had 2 on R 3 on L    There were no vitals filed for this visit.      Subjective Assessment - 08/06/16 1044    Subjective Patient reports she got a good report from follow up for iron deficiency and is not longer taking iron supplements. Reports her right hip pain is better since previous sessions. She is still not walking or moving as well as she would like to.    Pertinent History Patient reports she has had problems with walking for years with worsening symptoms in knees and difficulty with walking and she  underwent bilateral partial knee replacements to help with walking. Since her knee surgeries she has had increasing pain in left knee and has recently had to have a brace made to assist with left LE support. She is taking medication for muscle spasims to assist with decreasing tone in left LE with good results.States recent diagnosis of COPD.   Limitations Walking;Standing;House hold activities   How long can you stand comfortably? 5 min. due to right hip pain   How long can you walk comfortably? using walker 100 feet   Patient Stated Goals Patient would like to decrease hip pain and improve strength in order to walk more with less difficulty   Currently in Pain? Yes   Pain Score 4    Pain Location Hip   Pain Orientation Right;Left   Pain Descriptors / Indicators Sore   Pain Type Chronic pain   Pain Onset More than a month ago     Objective: Transfers: In/out of car to wheelchair stand/pivot with close supervision x 1; wheelchair to treatment table close supervision x 1; sit to supine to sit with minimal assist x 1 AAROM: right LE Hip flexion 90 degrees pre treatment; 95 post treatment 95; ER pre treatment 30/post treatment 35; SLR hamstring 40 pre/45 post treatment  Treatment:  Therapeutic exercise: Patient performed  with verbal/tactile cues and demonstration of therapist Goals; strength, improve ambulation, independent with home program; pain control Supine Lying: Isometric hip flexion x 5 reps hold 5 seconds each rep with manual resistance and assistance of therapist Isometric hip abduction x 5 reps hold 5 seconds each rep with manual resistance and assistance of therapist Isometric hip extension/leg press x 5 reps hold 5 seconds each rep with manual resistance of therapist Bilateral forward elevation with 4# weight x 15 reps with diaphragmatic breathing; inhale on raising arms, exhale with lowering Chest press with 4# dumbbells with assist to control left UE x 10 reps Straight arm  pull down with green resistive band and assist of therapist x 10 reps Biceps curls with green resistive band x 10 reps  Patient response to treatment: improved ROM and flexibility following treatment, ROM and isometric exercises. Improved transfers into car from wheelchair following treatment. Improved demonstration of exercises with moderate verbal cuing.        PT Education - 08/06/16 1120    Education provided Yes   Education Details HEP: added supine shoulder extension bilaterally, bilateral forward elevation, biceps curls; LE isometric extension, hip flexion and abduction   Person(s) Educated Patient   Methods Explanation;Demonstration;Verbal cues;Handout   Comprehension Verbalized understanding;Returned demonstration;Verbal cues required             PT Long Term Goals - 07/30/16 1630      PT LONG TERM GOAL #1   Title Patient will demonstrate ability to walk 75 ft with walker with improved weight shift, by 10/22/16 to demonstrate functional improvement in ability to ambulate   Baseline Ambulates short distances with difficulty using walker with hip pain and fatigue   Status New     PT LONG TERM GOAL #2   Title Patient will improve ROM in both hips with SLR to 60 degrees by 10/22/16 for improved mobility with transfers and walking with improved gait pattern.   Baseline decreased Hamstring length with SLR to 40 degrees bilaterally (increased tone)   Status New     PT LONG TERM GOAL #3   Title Patient will demonstrate a LEFS score of 35/80 by 10/22/16 to show significant improvement and LE functions indicating improved ability to perform standing and walking activties   Baseline LEFS: 10/80 (Severe self perceived dysfunction)   Status New     PT LONG TERM GOAL #4   Title Patient will be independent with self management of select exercises and control of tone with home program and family assistance as needed by 10/22/2016   Baseline limited knowledge and requires assistance to  perform all exercises with VC   Status New               Plan - 08/06/16 1219    Clinical Impression Statement Patient demonstrated improvement with ROM, flexibility with decreased right hip pain. She continues with decreased ROM, strength in UE's and LE's that limit function with daily tasks and will benefit from additional physical therapy intervention to achieve goals.    Rehab Potential Fair   Clinical Impairments Affecting Rehab Potential (+) motivated, age, previous good results with therapy intervention   (-) chronic condition/pain, multiple surgeries both LE's, significant hypertonicity/spasticity, COPD   PT Frequency 1x / week   PT Duration 12 weeks   PT Treatment/Interventions Gait training;Manual techniques;Therapeutic exercise;Electrical Stimulation;Moist Heat;Patient/family education;Passive range of motion;Ultrasound;Therapeutic activities;Energy conservation;Neuromuscular re-education;Balance training   PT Next Visit Plan flexiblity and strengthening exercises with assistance, estim./modalities as needed for pain control  PT Home Exercise Plan ROM exercises for LE's to improve flexibility   Consulted and Agree with Plan of Care Patient      Patient will benefit from skilled therapeutic intervention in order to improve the following deficits and impairments:  Decreased strength, Difficulty walking, Impaired flexibility, Decreased mobility, Decreased range of motion, Pain, Impaired tone, Decreased endurance  Visit Diagnosis: Muscle weakness (generalized)  Difficulty in walking, not elsewhere classified  Pain in right hip  Pain in left hip     Problem List Patient Active Problem List   Diagnosis Date Noted  . Chronic, continuous use of opioids 07/01/2015  . Chronic midline low back pain without sciatica 03/21/2015  . Acute renal failure (ARF) ([place]) 02/19/2015  . Type 2 diabetes mellitus without complication ([place]) 75/64/3329  . Cerebral palsy ([place]) 01/05/2014   . 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 Hip pain, chronic 04/28/2013  . Essential hypertension 01/10/2013  . Esophageal reflux 08/12/2012  . Primary localized osteoarthrosis, lower leg 05/07/2012    Jomarie Longs PT 08/06/2016, 8:46 PM  [facility] [address], [place], [address] Phone: [phone]   Fax:  [phone]  Name: Traci Davis MRN: [id number] Date of Birth: 12-05-1970

## 2016-08-14 ENCOUNTER — Ambulatory Visit: Payer: Medicare Other | Admitting: Physical Therapy

## 2016-08-23 ENCOUNTER — Ambulatory Visit: Payer: Medicare Other | Admitting: Physical Therapy

## 2016-08-24 ENCOUNTER — Emergency Department: Payer: Medicare Other

## 2016-08-24 ENCOUNTER — Emergency Department
Admission: EM | Admit: 2016-08-24 | Discharge: 2016-08-25 | Disposition: A | Discharge status: Home / Self Care | Payer: Medicare Other | Attending: Emergency Medicine | Admitting: Emergency Medicine

## 2016-08-24 DIAGNOSIS — W050XXA Fall from non-moving wheelchair, initial encounter: Secondary | ICD-10-CM | POA: Diagnosis not present

## 2016-08-24 DIAGNOSIS — I10 Essential (primary) hypertension: Secondary | ICD-10-CM | POA: Insufficient documentation

## 2016-08-24 DIAGNOSIS — J449 Chronic obstructive pulmonary disease, unspecified: Secondary | ICD-10-CM | POA: Insufficient documentation

## 2016-08-24 DIAGNOSIS — Z79899 Other long term (current) drug therapy: Secondary | ICD-10-CM | POA: Insufficient documentation

## 2016-08-24 DIAGNOSIS — E119 Type 2 diabetes mellitus without complications: Secondary | ICD-10-CM | POA: Diagnosis not present

## 2016-08-24 DIAGNOSIS — M545 Low back pain: Secondary | ICD-10-CM | POA: Insufficient documentation

## 2016-08-24 DIAGNOSIS — Z7984 Long term (current) use of oral hypoglycemic drugs: Secondary | ICD-10-CM | POA: Insufficient documentation

## 2016-08-24 DIAGNOSIS — S299XXA Unspecified injury of thorax, initial encounter: Secondary | ICD-10-CM | POA: Diagnosis present

## 2016-08-24 DIAGNOSIS — Y929 Unspecified place or not applicable: Secondary | ICD-10-CM | POA: Insufficient documentation

## 2016-08-24 DIAGNOSIS — Y999 Unspecified external cause status: Secondary | ICD-10-CM | POA: Diagnosis not present

## 2016-08-24 DIAGNOSIS — S2231XA Fracture of one rib, right side, initial encounter for closed fracture: Secondary | ICD-10-CM | POA: Insufficient documentation

## 2016-08-24 DIAGNOSIS — J45909 Unspecified asthma, uncomplicated: Secondary | ICD-10-CM | POA: Insufficient documentation

## 2016-08-24 DIAGNOSIS — Y9389 Activity, other specified: Secondary | ICD-10-CM | POA: Insufficient documentation

## 2016-08-24 MED ORDER — KETOROLAC TROMETHAMINE 60 MG/2ML IM SOLN
30.0000 mg | Freq: Once | INTRAMUSCULAR | Status: AC
  Administered 2016-08-24: 30 mg via INTRAMUSCULAR
  Filled 2016-08-24: qty 2

## 2016-08-24 NOTE — ED Triage Notes (Signed)
Pt presents to ED with c/o bilateral lower back pain and muscle spasms following a fall on Wednesday. Pt reports she her wheelchair wasn't locked and fell backwards when her wheelchair rolled away. Pt deneis any head injury or LOC. Pt reports using her home "pain medications and muscle relaxers" without relief.

## 2016-08-24 NOTE — ED Notes (Signed)
Pt reports fell when trying to sit in wheelchair that was unlocked and slid out from under her, pain to right lower back, CSM intact in RLE.

## 2016-08-25 DIAGNOSIS — S2231XA Fracture of one rib, right side, initial encounter for closed fracture: Secondary | ICD-10-CM | POA: Diagnosis not present

## 2016-08-25 MED ORDER — KETOROLAC TROMETHAMINE 10 MG PO TABS: 10.0000 mg | ORAL_TABLET | Freq: Four times a day (QID) | ORAL | 0 refills | Status: DC | PRN

## 2016-08-25 MED ORDER — OXYCODONE-ACETAMINOPHEN 5-325 MG PO TABS
2.0000 | ORAL_TABLET | Freq: Once | ORAL | Status: AC
  Administered 2016-08-25: 2 via ORAL
  Filled 2016-08-25: qty 2

## 2016-08-25 NOTE — ED Provider Notes (Signed)
[facility] Emergency Department Provider Note ____________________________________________  Time seen: Approximately 11:15 PM  I have reviewed the triage vital signs and the nursing notes.   HISTORY  Chief Complaint Back Pain and Fall    HPI Traci Davis is a 46 y.o. female who presents to the emergency department for evaluation of right lower back pain 2 days post fall. She states thather wheelchair wasn't Loc and she slipped out of it and hit her back on the chair as she fell to the ground. She has been taking muscle relaxers and pain medication without relief. She states this is different from her chronic pain. Pain increases with deep breaths or twisting motion. She denies shortness of breath or fever.  Past Medical History:  Diagnosis Date  . Anxiety   . Arthritis   . Asthma   . Cerebral palsy ([place])   . COPD (chronic obstructive pulmonary disease) ([place])   . Depression   . Diabetes mellitus without complication ([place])   . GERD (gastroesophageal reflux disease)   . Hypertension     Patient Active Problem List   Diagnosis Date Noted  . Chronic, continuous use of opioids 07/01/2015  . Chronic midline low back pain without sciatica 03/21/2015  . Acute renal failure (ARF) ([place]) 02/19/2015  . Type 2 diabetes mellitus without complication ([place]) 29/52/8413  . Cerebral palsy ([place]) 01/05/2014  . 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 Hip pain, chronic 04/28/2013  . Essential hypertension 01/10/2013  . Esophageal reflux 08/12/2012  . Primary localized osteoarthrosis, lower leg 05/07/2012    Past Surgical History:  Procedure Laterality Date  . CESAREAN SECTION  1995  . CHOLECYSTECTOMY    . FOOT CAPSULE RELEASE W/ PERCUTANEOUS HEEL CORD LENGTHENING, TIBIAL TENDON TRANSFER     age 33 ,and 2010-both legs  . JOINT REPLACEMENT Right    both knees partial replacements dates unknown  . knee replacment     x 5 per pt. last one- left  knee 2014. has had 2 on R 3 on L    Prior to Admission medications   Medication Sig Start Date End Date Taking? Authorizing Provider  albuterol (PROVENTIL HFA;VENTOLIN HFA) 108 (90 BASE) MCG/ACT inhaler Inhale 2 puffs into the lungs 4 (four) times daily as needed. For shortness of breath and/or wheezing 09/22/14   Historical Provider, MD  ARIPiprazole (ABILIFY) 5 MG tablet Take 5 mg by mouth daily.    Historical Provider, MD  Blood Glucose Monitoring Suppl (GLUCOCOM BLOOD GLUCOSE MONITOR) DEVI Test daily before all meals/snacks and once before bedtime. 09/07/14   Historical Provider, MD  budesonide-formoterol (SYMBICORT) 160-4.5 MCG/ACT inhaler 2 PUMP(S) INHALATION TWO TIMES A DAY 09/29/15   Historical Provider, MD  CREON 36000 UNITS CPEP capsule Take 24,401-02,725 Units by mouth See admin instructions. Take 72000 units oral three times a day with meals, and take 36000 unit orally two times a day with snack. 02/12/15   Historical Provider, MD  DULoxetine (CYMBALTA) 60 MG capsule Take 120 mg by mouth daily.     Historical Provider, MD  furosemide (LASIX) 40 MG tablet Take 40 mg by mouth.    Historical Provider, MD  gabapentin (NEURONTIN) 300 MG capsule Take 600 mg by mouth 3 (three) times daily.     Historical Provider, MD  glimepiride (AMARYL) 4 MG tablet Take 4 mg by mouth daily with breakfast.    Historical Provider, MD  glucose blood (ONE TOUCH ULTRA TEST) test strip USE TO TEST BLOOD SUGAR TWO  TIMES A DAY 12/07/14   Historical Provider, MD  hydrochlorothiazide (HYDRODIURIL) 25 MG tablet Take 25 mg by mouth daily.    Historical Provider, MD  incobotulinumtoxinA (XEOMIN) 100 units SOLR injection Inject into the muscle. 03/29/15   Historical Provider, MD  Influenza Vac Typ A&B Surf Ant (FLUVIRIN) 0.5 ML SUSY Inject into the muscle.    Historical Provider, MD  ketorolac (TORADOL) 10 MG tablet Take 1 tablet (10 mg total) by mouth every 6 (six) hours as needed. 08/25/16   Victorino Dike, FNP  Lancets  (FREESTYLE) lancets Test daily before all meals/snacks 09/07/14   Historical Provider, MD  lisinopril (PRINIVIL,ZESTRIL) 20 MG tablet Take 20 mg by mouth daily.    Historical Provider, MD  lubiprostone (AMITIZA) 24 MCG capsule Take 24 mcg by mouth 2 (two) times daily as needed for constipation.     Historical Provider, MD  metFORMIN (GLUCOPHAGE) 500 MG tablet Take by mouth.    Historical Provider, MD  methocarbamol (ROBAXIN) 750 MG tablet Take 750 mg by mouth 3 (three) times daily.    Historical Provider, MD  mometasone-formoterol [facility]) 200-5 MCG/ACT AERO Frequency:PRN   Dosage:0.0     Instructions:  Note:Dose: 200-5 MCG 06/05/12   Historical Provider, MD  naloxone [facility]) nasal spray 4 mg/0.1 mL One spray in either nostril once for known/suspected opioid overdose. May repeat every 2-3 minutes in alternating nostril til EMS arrives 02/25/15   Historical Provider, MD  nystatin (MYCOSTATIN/NYSTOP) powder 1 application daily. 04/10/13   Historical Provider, MD  omeprazole (PRILOSEC) 40 MG capsule Take 40 mg by mouth 2 (two) times daily.    Historical Provider, MD  oxyCODONE (OXY IR/ROXICODONE) 5 MG immediate release tablet Take 5 mg by mouth every 8 (eight) hours as needed for moderate pain or severe pain.     Historical Provider, MD  pioglitazone (ACTOS) 45 MG tablet Take 45 mg by mouth daily.    Historical Provider, MD  polyethylene glycol (MIRALAX / GLYCOLAX) packet Use 17 gram or 1 capful in 8 oz of fluid of your choice three times a day until having soft stools, then back down to once a day. 09/28/15   Historical Provider, MD  potassium chloride SA (K-DUR,KLOR-CON) 20 MEQ tablet Take 20 mEq by mouth 2 (two) times daily.    Historical Provider, MD  pregabalin (LYRICA) 50 MG capsule Take 50 mg by mouth. 05/01/16   Historical Provider, MD  promethazine (PHENERGAN) 25 MG tablet Take 25 mg by mouth 3 (three) times daily as needed for nausea or vomiting.     Historical Provider, MD  solifenacin (VESICARE) 5  MG tablet Take 5 mg by mouth daily.    Historical Provider, MD  tiotropium (SPIRIVA HANDIHALER) 18 MCG inhalation capsule Place into inhaler and inhale. 07/08/13   Historical Provider, MD  topiramate (TOPAMAX) 100 MG tablet Take 100 mg by mouth daily. 07/17/16   Historical Provider, MD    Allergies Cephalexin; Meloxicam; Baclofen; Morphine and related; Vantin  [cefpodoxime]; Ciprofloxacin; and Tape  Family History  Problem Relation Age of Onset  . CAD Father   . Heart attack Brother     Social History Social History  Substance Use Topics  . Smoking status: Never Smoker  . Smokeless tobacco: Never Used  . Alcohol use No    Review of Systems Constitutional: No recent illness. Cardiovascular: Denies chest pain or palpitations. Respiratory: Denies shortness of breath. Musculoskeletal: Pain in right lower back. Skin: Negative for rash, wound, lesion. Neurological: Negative for focal  weakness or numbness.  ____________________________________________   PHYSICAL EXAM:  VITAL SIGNS: ED Triage Vitals  Enc Vitals Group     BP 08/24/16 2142 135/73     Pulse Rate 08/24/16 2142 82     Resp 08/24/16 2142 18     Temp 08/24/16 2142 98 F (36.7 C)     Temp Source 08/24/16 2142 Oral     SpO2 08/24/16 2142 99 %     Weight 08/24/16 2137 200 lb (90.7 kg)     Height 08/24/16 2137 5\' 2"  (1.575 m)     Head Circumference --      Peak Flow --      Pain Score 08/24/16 2137 8     Pain Loc --      Pain Edu? --      Excl. in [place]? --     Constitutional: Alert and oriented. Well appearing and in no acute distress. Eyes: Conjunctivae are normal. EOMI. Head: Atraumatic. Neck: No stridor.  Respiratory: Normal respiratory effort.   Musculoskeletal: Focal tenderness in the right lower lateral rib area without any obvious deformity. There is no midline tenderness over the thoracic or lumbar spine. Neurologic:  Normal speech and language. No gross focal neurologic deficits are appreciated. Speech  is normal. No gait instability. Skin:  Skin is warm, dry and intact. Atraumatic. Psychiatric: Mood and affect are normal. Speech and behavior are normal.  ____________________________________________   LABS (all labs ordered are listed, but only abnormal results are displayed)  Labs Reviewed - No data to display ____________________________________________  RADIOLOGY  Acute displaced fracture of the anterior lateral ninth rib. ____________________________________________   PROCEDURES  Procedure(s) performed: None  ____________________________________________   INITIAL IMPRESSION / ASSESSMENT AND PLAN / ED COURSE  46 year old female presenting to the emergency department for evaluation of pain post fall. Rib fracture identified on x-ray. She was treated with Toradol and had some relief. She will be given 2 oxycodone prior to discharge. She states that she has only been taking 2 of her pain pills and is allowed to take up to 4 per day. She was instructed to take the additional 2 if needed for pain. She was advised to speak with her pain management provider to discuss any additional changes. She was also advised to follow-up with her primary care provider for symptoms that are not improving over the next couple of weeks. She was instructed to return to the emergency department for any shortness of breath or new symptoms of concern.  Pertinent labs & imaging results that were available during my care of the patient were reviewed by me and considered in my medical decision making (see chart for details).  _________________________________________   FINAL CLINICAL IMPRESSION(S) / ED DIAGNOSES  Final diagnoses:  Closed fracture of one rib of right side, initial encounter    Discharge Medication List as of 08/25/2016 12:49 AM    START taking these medications   Details  ketorolac (TORADOL) 10 MG tablet Take 1 tablet (10 mg total) by mouth every 6 (six) hours as needed., Starting  Sat 08/25/2016, Print        If controlled substance prescribed during this visit, 12 month history viewed on the [place] prior to issuing an initial prescription for Schedule II or III opiod.    Victorino Dike, FNP 08/25/16 4970    Harvest Dark, MD 08/26/16 2257

## 2016-08-25 NOTE — Discharge Instructions (Signed)
Follow up with your PCP or pain management. Return to the ER for symptoms that change or worsen or for new concerns.

## 2016-08-28 ENCOUNTER — Ambulatory Visit: Payer: Medicare Other | Admitting: Physical Therapy

## 2016-09-04 ENCOUNTER — Encounter: Payer: Medicare Other | Admitting: Physical Therapy

## 2016-09-14 ENCOUNTER — Encounter: Payer: Self-pay | Admitting: Physical Therapy

## 2016-09-14 ENCOUNTER — Ambulatory Visit: Payer: Medicare Other | Attending: Family Medicine | Admitting: Physical Therapy

## 2016-09-14 DIAGNOSIS — R262 Difficulty in walking, not elsewhere classified: Secondary | ICD-10-CM | POA: Diagnosis present

## 2016-09-14 DIAGNOSIS — M6281 Muscle weakness (generalized): Secondary | ICD-10-CM | POA: Diagnosis not present

## 2016-09-15 NOTE — Therapy (Signed)
[facility] [address], [place], [address] Phone: [phone]   Fax:  [phone]  Physical Therapy Treatment  Patient Details  Name: Traci Davis MRN: [id number] Date of Birth: 1970-05-21 Referring Provider: Remi Haggard. FNP  Encounter Date: 09/14/2016      PT End of Session - 09/14/16 1100    Visit Number 3   Number of Visits 12   Date for PT Re-Evaluation 10/22/16   Authorization Type 3   Authorization Time Period 10 (G Code)   PT Start Time 1045   PT Stop Time 1126   PT Time Calculation (min) 41 min   Activity Tolerance Patient tolerated treatment well;Patient limited by pain   Behavior During Therapy WFL for tasks assessed/performed      Past Medical History:  Diagnosis Date  . Anxiety   . Arthritis   . Asthma   . Cerebral palsy ([place])   . COPD (chronic obstructive pulmonary disease) ([place])   . Depression   . Diabetes mellitus without complication ([place])   . GERD (gastroesophageal reflux disease)   . Hypertension     Past Surgical History:  Procedure Laterality Date  . CESAREAN SECTION  1995  . CHOLECYSTECTOMY    . FOOT CAPSULE RELEASE W/ PERCUTANEOUS HEEL CORD LENGTHENING, TIBIAL TENDON TRANSFER     age 46 ,and 2010-both legs  . JOINT REPLACEMENT Right    both knees partial replacements dates unknown  . knee replacment     x 5 per pt. last one- left knee 2014. has had 2 on R 3 on L    There were no vitals filed for this visit.      Subjective Assessment - 09/14/16 1047    Subjective Patient reports that since her last visit in therapy she has fractured her rib on right side and it is improving at this time. She has lost some weight by controlling her portion size and with doctor monitoring. She is trying to stand and walk with walker and brace on left LE. She reports she is doing better.    Pertinent History Patient reports she has had problems with walking for years with  worsening symptoms in knees and difficulty with walking and she underwent bilateral partial knee replacements to help with walking. Since her knee surgeries she has had increasing pain in left knee and has recently had to have a brace made to assist with left LE support. She is taking medication for muscle spasims to assist with decreasing tone in left LE with good results.States recent diagnosis of COPD.   Limitations Walking;Standing;House hold activities   How long can you stand comfortably? 5 min. due to right hip pain   How long can you walk comfortably? using walker 100 feet   Patient Stated Goals Patient would like to decrease hip pain and improve strength in order to walk more with less difficulty   Currently in Pain? Pain level Yes 4   Pain Location Rib cage   Pain Orientation Right   Pain Descriptors / Indicators Aching   Pain Type Acute pain   Pain Onset 1 to 4 weeks ago   Pain Frequency Intermittent     Objective: Observation: patient arrived in wheelchair with KAFO in place left LE Transfers: independent WC to treatment table stand pivot with close supervision of therapist for safety Sit to supine to sit with moderate assistance x 1 due to not using right UE (rib fracture) Flexibility/Tone:  increased extensor tone bilateral LE's, decreased flexibility in hips, hamstring muscles with SLR bilateral to ~45 degrees  Treatment: Therapeutic exercises: goal: improve flexibility, gait, strength:  patient performed with assistance, VC and instruction of therapist Supine with head of table elevated: AAROM bilateral hip, knee and ankle exercises for flexibility/ROM, multiple repetitions/sets each LE Isometric resistive exercises with manual resistance given by therapist for hip ER, extension, abduction x 10 reps each LE Stretches to calf and hamstring muscles x 3 reps each x 30 seconds each Sit to stand at walker:  Weight shifting performed with close supervision and assistance to keep  walker in place: side to side x 10 reps Patient performed simulated walking with one LE while standing on opposite LE 3 reps each LE with assistance to advance left LE and to steady walker  Patient response to treatment: patient demonstrated improved technique with exercises with moderate assistance and cuing. Patient without increased pain during therapy session. Improved motor control with repetition and cuing.       PT Education - 09/14/16 1133    Education provided Yes   Education Details re assessed home program, standing and weight shifting in walker to improve walking   Person(s) Educated Patient   Methods Explanation;Demonstration;Verbal cues;Handout   Comprehension Verbalized understanding;Returned demonstration;Verbal cues required             PT Long Term Goals - 07/30/16 1630      PT LONG TERM GOAL #1   Title Patient will demonstrate ability to walk 75 ft with walker with improved weight shift, by 10/22/16 to demonstrate functional improvement in ability to ambulate   Baseline Ambulates short distances with difficulty using walker with hip pain and fatigue   Status New     PT LONG TERM GOAL #2   Title Patient will improve ROM in both hips with SLR to 60 degrees by 10/22/16 for improved mobility with transfers and walking with improved gait pattern.   Baseline decreased Hamstring length with SLR to 40 degrees bilaterally (increased tone)   Status New     PT LONG TERM GOAL #3   Title Patient will demonstrate a LEFS score of 35/80 by 10/22/16 to show significant improvement and LE functions indicating improved ability to perform standing and walking activties   Baseline LEFS: 10/80 (Severe self perceived dysfunction)   Status New     PT LONG TERM GOAL #4   Title Patient will be independent with self management of select exercises and control of tone with home program and family assistance as needed by 10/22/2016   Baseline limited knowledge and requires assistance to  perform all exercises with VC   Status New               Plan - 09/14/16 1130    Clinical Impression Statement Patient is returning to therapy following extended absence due to fall with fractured 9th rib right side. She is currently improving with less pain and working on standing and walking. She has limited abiltiy to perform standing, walking with walker and will benefit from physical therapy to progress strength, flexibiltiy and functional ambulation.    Rehab Potential Fair   Clinical Impairments Affecting Rehab Potential (+) motivated, age, previous good results with therapy intervention   (-) chronic condition/pain, multiple surgeries both LE's, significant hypertonicity/spasticity, COPD   PT Frequency 1x / week   PT Duration 12 weeks   PT Treatment/Interventions Gait training;Manual techniques;Therapeutic exercise;Electrical Stimulation;Moist Heat;Patient/family education;Passive range of motion;Ultrasound;Therapeutic activities;Energy conservation;Neuromuscular re-education;Balance training  PT Next Visit Plan flexiblity and strengthening exercises with assistance, standing, walking and weight shifting exercises   PT Home Exercise Plan ROM exercises for LE's to improve flexibility, weight shifting   Consulted and Agree with Plan of Care Patient      Patient will benefit from skilled therapeutic intervention in order to improve the following deficits and impairments:  Decreased strength, Difficulty walking, Impaired flexibility, Decreased mobility, Decreased range of motion, Pain, Impaired tone, Decreased endurance  Visit Diagnosis: Muscle weakness (generalized)  Difficulty in walking, not elsewhere classified     Problem List Patient Active Problem List   Diagnosis Date Noted  . Chronic, continuous use of opioids 07/01/2015  . Chronic midline low back pain without sciatica 03/21/2015  . Acute renal failure (ARF) ([place]) 02/19/2015  . Type 2 diabetes mellitus without  complication ([place]) 78/29/5621  . Cerebral palsy ([place]) 01/05/2014  . 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 Hip pain, chronic 04/28/2013  . Essential hypertension 01/10/2013  . Esophageal reflux 08/12/2012  . Primary localized osteoarthrosis, lower leg 05/07/2012    Jomarie Longs PT 09/15/2016, 6:23 PM  [facility] [address], [place], [address] Phone: [phone]   Fax:  [phone]  Name: HALAH WHITESIDE MRN: [id number] Date of Birth: [birth year]

## 2016-09-18 ENCOUNTER — Ambulatory Visit: Payer: Medicare Other | Admitting: Physical Therapy

## 2016-09-25 ENCOUNTER — Ambulatory Visit: Payer: Medicare Other | Admitting: Physical Therapy

## 2016-09-25 ENCOUNTER — Encounter: Payer: Self-pay | Admitting: Physical Therapy

## 2016-09-25 DIAGNOSIS — R262 Difficulty in walking, not elsewhere classified: Secondary | ICD-10-CM

## 2016-09-25 DIAGNOSIS — M6281 Muscle weakness (generalized): Secondary | ICD-10-CM

## 2016-09-25 NOTE — Therapy (Signed)
[facility] [address] [place], [address] Phone: [phone]   Fax:  [phone]  Physical Therapy Treatment  Patient Details  Name: Traci Davis MRN: [id number] Date of Birth: Dec 02, 1970 Referring Provider: Remi Haggard. FNP  Encounter Date: 09/25/2016      PT End of Session - 09/25/16 1145    Visit Number 4   Number of Visits 12   Date for PT Re-Evaluation 10/22/16   Authorization Type 4   Authorization Time Period 10 (G Code)   PT Start Time 1138   PT Stop Time 1217   PT Time Calculation (min) 39 min   Activity Tolerance Patient tolerated treatment well;Patient limited by pain   Behavior During Therapy [facility] for tasks assessed/performed      Past Medical History:  Diagnosis Date  . Anxiety   . Arthritis   . Asthma   . Cerebral palsy ([place])   . COPD (chronic obstructive pulmonary disease) ([place])   . Depression   . Diabetes mellitus without complication ([place])   . GERD (gastroesophageal reflux disease)   . Hypertension     Past Surgical History:  Procedure Laterality Date  . CESAREAN SECTION  1995  . CHOLECYSTECTOMY    . FOOT CAPSULE RELEASE W/ PERCUTANEOUS HEEL CORD LENGTHENING, TIBIAL TENDON TRANSFER     age 13 ,and 2010-both legs  . JOINT REPLACEMENT Right    both knees partial replacements dates unknown  . knee replacment     x 5 per pt. last one- left knee 2014. has had 2 on R 3 on L    There were no vitals filed for this visit.      Subjective Assessment - 09/25/16 1141    Subjective Patient reports she is feeling better and walking more: getting up on walker, washing clothes, walking more around the home.    Pertinent History Patient reports she has had problems with walking for years with worsening symptoms in knees and difficulty with walking and she underwent bilateral partial knee replacements to help with walking. Since her knee surgeries she has had increasing pain in  left knee and has recently had to have a brace made to assist with left LE support. She is taking medication for muscle spasims to assist with decreasing tone in left LE with good results.States recent diagnosis of COPD.   Limitations Walking;Standing;House hold activities   How long can you stand comfortably? 5 min. due to right hip pain   How long can you walk comfortably? using walker 100 feet   Patient Stated Goals Patient would like to decrease hip pain and improve strength in order to walk more with less difficulty   Currently in Pain? No/denies          Objective: Observation: patient arrived in clinic walking with walker with KAFO left LE Transfers: independent WC to treatment table stand pivot with close supervision of therapist for safety Sit to supine to sit with miniimal assistance x 1  Flexibility/Tone: increased extensor tone bilateral LE's, decreased flexibility in hips, hamstring muscles with SLR bilateral to ~50 degrees (improved from previous session)  Treatment: Therapeutic exercises: goal: improve flexibility, gait, strength:  patient performed with assistance, VC and instruction of therapist Supine with head of table elevated: AAROM bilateral hip, knee and ankle exercises for flexibility/ROM, multiple repetitions/sets each LE Isometric resistive exercises with manual resistance given by therapist for hip ER, IR, abduction x 10 reps each LE Stretches to  calf and hamstring muscles x 3 reps each x 30 seconds each Sitting: Hamstring curls with resistive band x 15 reps with assistance Core and UE strengthening with green resistive band Bilateral scapular retraction x 15 reps, single arm row x 15 reps palloff press x 20 reps Straight arm pull downs x 15 reps  Patient response to treatment: Patient demonstrated improved flexibility in both LE's with repetition. Patient demonstrated improved gait pattern and more erect posture following treatment.            PT  Education - 09/25/16 1143    Education provided Yes   Education Details continue with exercises as instructed   Person(s) Educated Patient   Methods Explanation   Comprehension Verbalized understanding             PT Long Term Goals - 07/30/16 1630      PT LONG TERM GOAL #1   Title Patient will demonstrate ability to walk 78 ft with walker with improved weight shift, by 10/22/16 to demonstrate functional improvement in ability to ambulate   Baseline Ambulates short distances with difficulty using walker with hip pain and fatigue   Status New     PT LONG TERM GOAL #2   Title Patient will improve ROM in both hips with SLR to 60 degrees by 10/22/16 for improved mobility with transfers and walking with improved gait pattern.   Baseline decreased Hamstring length with SLR to 40 degrees bilaterally (increased tone)   Status New     PT LONG TERM GOAL #3   Title Patient will demonstrate a LEFS score of 35/80 by 10/22/16 to show significant improvement and LE functions indicating improved ability to perform standing and walking activties   Baseline LEFS: 10/80 (Severe self perceived dysfunction)   Status New     PT LONG TERM GOAL #4   Title Patient will be independent with self management of select exercises and control of tone with home program and family assistance as needed by 10/22/2016   Baseline limited knowledge and requires assistance to perform all exercises with VC   Status New               Plan - 09/25/16 1230    Clinical Impression Statement Patient demonstrated improved gait pattern with more erect posture and able to advance LE's with greater ease following session. She continues with weakness and decreased endurance and will benefit from continued physical therapy intervention to address limitations.    Rehab Potential Fair   Clinical Impairments Affecting Rehab Potential (+) motivated, age, previous good results with therapy intervention   (-) chronic  condition/pain, multiple surgeries both LE's, significant hypertonicity/spasticity, COPD   PT Frequency 1x / week   PT Duration 12 weeks   PT Treatment/Interventions Gait training;Manual techniques;Therapeutic exercise;Electrical Stimulation;Moist Heat;Patient/family education;Passive range of motion;Ultrasound;Therapeutic activities;Energy conservation;Neuromuscular re-education;Balance training   PT Next Visit Plan flexiblity and strengthening exercises with assistance, standing, walking and weight shifting exercises   PT Home Exercise Plan ROM exercises for LE's to improve flexibility, weight shifting   Consulted and Agree with Plan of Care Patient      Patient will benefit from skilled therapeutic intervention in order to improve the following deficits and impairments:  Decreased strength, Difficulty walking, Impaired flexibility, Decreased mobility, Decreased range of motion, Pain, Impaired tone, Decreased endurance  Visit Diagnosis: Muscle weakness (generalized)  Difficulty in walking, not elsewhere classified     Problem List Patient Active Problem List   Diagnosis Date Noted  .  Chronic, continuous use of opioids 07/01/2015  . Chronic midline low back pain without sciatica 03/21/2015  . Acute renal failure (ARF) ([place]) 02/19/2015  . Type 2 diabetes mellitus without complication ([place]) 66/81/5947  . Cerebral palsy ([place]) 01/05/2014  . 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 Hip pain, chronic 04/28/2013  . Essential hypertension 01/10/2013  . Esophageal reflux 08/12/2012  . Primary localized osteoarthrosis, lower leg 05/07/2012    Jomarie Longs PT 09/25/2016, 7:10 PM  [facility] [address], [place], [address] Phone: [phone]   Fax:  [phone]  Name: Traci Davis MRN: [id number] Date of Birth: July 09, 1970

## 2016-10-02 ENCOUNTER — Ambulatory Visit: Payer: Medicare Other | Attending: Family Medicine | Admitting: Physical Therapy

## 2016-10-09 ENCOUNTER — Ambulatory Visit: Payer: Medicare Other | Admitting: Physical Therapy

## 2016-10-16 ENCOUNTER — Ambulatory Visit: Payer: Medicare Other | Admitting: Physical Therapy

## 2016-10-23 ENCOUNTER — Ambulatory Visit: Payer: Medicare Other | Admitting: Physical Therapy

## 2016-11-30 ENCOUNTER — Inpatient Hospital Stay: Payer: Medicare Other | Admitting: Oncology

## 2016-11-30 ENCOUNTER — Inpatient Hospital Stay: Payer: Medicare Other

## 2016-12-04 DIAGNOSIS — R51 Headache: Secondary | ICD-10-CM

## 2016-12-04 DIAGNOSIS — R519 Headache, unspecified: Secondary | ICD-10-CM | POA: Insufficient documentation

## 2016-12-25 ENCOUNTER — Inpatient Hospital Stay: Payer: Medicare Other

## 2016-12-25 ENCOUNTER — Inpatient Hospital Stay: Payer: Medicare Other | Admitting: Oncology

## 2016-12-28 ENCOUNTER — Inpatient Hospital Stay: Payer: Medicare Other | Admitting: Oncology

## 2016-12-28 ENCOUNTER — Inpatient Hospital Stay: Payer: Medicare Other

## 2017-01-08 DIAGNOSIS — Z0289 Encounter for other administrative examinations: Secondary | ICD-10-CM | POA: Insufficient documentation

## 2017-01-14 DIAGNOSIS — R413 Other amnesia: Secondary | ICD-10-CM | POA: Insufficient documentation

## 2017-03-18 ENCOUNTER — Ambulatory Visit: Payer: Medicare Other | Admitting: Physical Therapy

## 2017-03-26 ENCOUNTER — Encounter: Payer: Self-pay | Admitting: Physical Therapy

## 2017-03-26 ENCOUNTER — Ambulatory Visit: Payer: Medicare Other | Attending: Sports Medicine | Admitting: Physical Therapy

## 2017-03-26 ENCOUNTER — Ambulatory Visit: Payer: Medicare Other | Admitting: Physical Therapy

## 2017-03-26 DIAGNOSIS — M6281 Muscle weakness (generalized): Secondary | ICD-10-CM | POA: Diagnosis present

## 2017-03-26 DIAGNOSIS — M62838 Other muscle spasm: Secondary | ICD-10-CM | POA: Diagnosis present

## 2017-03-26 DIAGNOSIS — M25511 Pain in right shoulder: Secondary | ICD-10-CM

## 2017-03-27 NOTE — Therapy (Signed)
[facility] [address] [place], [address] Phone: [phone]   Fax:  [phone]  Physical Therapy Evaluation  Patient Details  Name: Traci Davis MRN: [id number] Date of Birth: 01-02-1971 Referring Provider: Rosalia Hammers DO   Encounter Date: 03/26/2017  PT End of Session - 03/26/17 1625    Visit Number  1    Number of Visits  6    Date for PT Re-Evaluation  05/07/17    Authorization Type  1    Authorization Time Period  10 (G Code)    PT Start Time  1535    PT Stop Time  1620    PT Time Calculation (min)  45 min    Activity Tolerance  Patient tolerated treatment well;Patient limited by lethargy    Behavior During Therapy  [facility] for tasks assessed/performed;Flat affect       Past Medical History:  Diagnosis Date  . Anxiety   . Arthritis   . Asthma   . Cerebral palsy ([place])   . COPD (chronic obstructive pulmonary disease) ([place])   . Depression   . Diabetes mellitus without complication ([place])   . GERD (gastroesophageal reflux disease)   . Hypertension     Past Surgical History:  Procedure Laterality Date  . CESAREAN SECTION  1995  . CHOLECYSTECTOMY    . FOOT CAPSULE RELEASE W/ PERCUTANEOUS HEEL CORD LENGTHENING, TIBIAL TENDON TRANSFER     age 63 ,and 2010-both legs  . JOINT REPLACEMENT Right    both knees partial replacements dates unknown  . knee replacment     x 5 per pt. last one- left knee 2014. has had 2 on R 3 on L    There were no vitals filed for this visit.   Subjective Assessment - 03/26/17 1546    Subjective  Patient reports pain in right UE from shoulder to forearm. Denies numbness/paresthesias. She is having difficulty sleeping, cutting food, lifting, washing. She is self managing pain with heating pad.     Pertinent History  Patient reports she began having right shoulder pain about 2 months ago when she had to use a manual chair because her electric chair broke. She had  to reach back to shut a door and her right shoulder pain began.  Her pain has gotten progressivly worse since then and is now radiating into her forearm.     Limitations  Lifting;House hold activities;Other (comment) sleeping    Diagnostic tests  X rays;     Patient Stated Goals  to get rid of pain and be able to use right arm again without difficulty    Currently in Pain?  Yes    Pain Score  6  pain ranges from 3/10 up to 8/10    Pain Location  Shoulder    Pain Orientation  Right    Pain Descriptors / Indicators  Aching    Pain Type  Acute pain    Pain Onset  More than a month ago 2 months ago    Pain Frequency  Constant    Aggravating Factors   lifting, moving right UE in certain positions    Pain Relieving Factors  rest, heat    Effect of Pain on Daily Activities  limits dailyt tasks personal care, household activities, lifting, using walker for ambulation         [facility] PT Assessment - 03/26/17 1557      Assessment   Medical Diagnosis  Acute  right shoulder pain    Referring Provider  Rosalia Hammers DO    Onset Date/Surgical Date  12/29/16    Hand Dominance  Right    Prior Therapy  none for this condition      Precautions   Precautions  None      Balance Screen   Has the patient fallen in the past 6 months  Yes    How many times?  slid out of bed 3 days ago, landed on buttocks, no injury     Has the patient had a decrease in activity level because of a fear of falling?   No    Is the patient reluctant to leave their home because of a fear of falling?   No      Home Environment   Living Environment  Private residence    Living Arrangements  Spouse/significant other    Type of [place] entrance    [place] - 2 wheels;Walker - standard;Bedside commode;Tub bench;Toilet riser;Hand held shower head;Wheelchair - power      Prior Function   Level of Independence  Independent with household mobility with device;Independent with  gait;Independent with transfers;Requires assistive device for independence;Needs assistance with ADLs;Needs assistance with homemaking    Dressing  Minimal    Meal Prep  Total    Laundry  Unable to assess    Vacuuming  Other (comment)    Vocation  On disability    Leisure  Spending time with family outside.      Cognition   Overall Cognitive Status  Within Functional Limits for tasks assessed      Observation/Other Assessments   Quick DASH   60%      Posture/Postural Control   Posture Comments  forward rounded shoulders, increased thoracic kyphosis       ROM / Strength   AROM / PROM / Strength  AROM;PROM;Strength      AROM   Overall AROM Comments  right shoulder forward elevation to 90 degrees with reports of pain end range, functional ER and IR limited by pain      PROM   Overall PROM Comments  seated: right shoulder 150 degrees with assist of therapist, no increased pain      Strength   Overall Strength Comments  right UE shoulder forward elevation, abduction, ER, IR strong and non painful      Palpation   Palpation comment  point tender anterior aspect right shoulder       Neer Impingement test    Findings  Positive    Side  Right    Comments  mild to moderate reproduction of symptoms      Drop Arm test   Findings  Negative    Side  Right      Speeds test   findings  Positive    Side  Right    Comment  reproduced pain anteerior aspect of right upper arm             Objective measurements completed on examination: See above findings.      PT Long Term Goals - 03/26/17 1622      PT LONG TERM GOAL #1   Title  Patient will report pain level max of 3/10 right shoulder with aggravating positions of reaching behind and lifting     Baseline  pain level ranges up to 8/10 with aggravating positions    Status  New  Target Date  04/17/17      PT LONG TERM GOAL #2   Title  Patient will report pain level max of < 3/10 right shoulder with aggravating  positions of reaching behind and lifting     Baseline  pain level ranges up to 8/10 with aggravating positions    Status  New    Target Date  05/07/17      PT LONG TERM GOAL #3   Title  improved Quick Dash to 40% or less indicating improved function with right UE with less difficulty and pain    Baseline  Quick Dash 60%    Status  New    Target Date  04/17/17      PT LONG TERM GOAL #4   Title  improved Quick Dash to 20% or less indicating improved function with right UE with less difficulty and pain    Baseline  QuickDash 60%    Status  New    Target Date  05/07/17      PT LONG TERM GOAL #5   Title  Patient will be independent with home program for pain control, self managment of symptoms, exercises to allow transition to home program when discharged from physical therapy    Baseline  Patient is limited in knowledge of appropriate pain contro and exercises, precautions    Status  New    Target Date  05/07/17             Plan - 03/26/17 1631    Clinical Impression Statement  Patient is a 46 year old right hand dominant female who presents with acute right shoulder pain that has worsened since pain began. She has limitations with personal care, walking with walker, lifting and using right UE for daily tasks. Her Quick Dash score of 60% indicates severe self perceived disability. She has no knowledge of appropriate pain control, exercises and progression and will benefit from physical therapy intervention to improve pain and functional use of right UE.     History and Personal Factors relevant to plan of care:  Patient reports she began having right shoulder pain about 2 months ago when she had to use a manual chair because her electric chair broke. She had to reach back to shut a door and her right shoulder pain began.  Her pain has gotten progressivly worse since then and is now radiating into her forearm. She has history of Cerebral Palsy, multiple knee surgeries, diabetes.      Clinical Presentation  Evolving    Clinical Presentation due to:  symptoms worse than when they began, radiating into right UE to elbow    Clinical Decision Making  Moderate    Rehab Potential  Good    Clinical Impairments Affecting Rehab Potential  (+) acute condition, motivated, age(-)multiple co morbidities    PT Frequency  1x / week    PT Duration  6 weeks    PT Treatment/Interventions  Cryotherapy;Electrical Stimulation;Ultrasound;Moist Heat;Therapeutic exercise;Patient/family education;Neuromuscular re-education;Manual techniques    PT Next Visit Plan  pain control, manual therapy STM, exercise instruction, progression    PT Home Exercise Plan  pain control with heat/ice, ROM exercises, scapular retraction, positions to avoid pain    Consulted and Agree with Plan of Care  Patient       Patient will benefit from skilled therapeutic intervention in order to improve the following deficits and impairments:  Decreased knowledge of precautions, Impaired UE functional use, Impaired perceived functional ability, Decreased strength, Decreased range of  motion, Increased muscle spasms, Pain  Visit Diagnosis: Acute pain of right shoulder - Plan: PT plan of care cert/re-cert  Muscle weakness (generalized) - Plan: PT plan of care cert/re-cert  Other muscle spasm - Plan: PT plan of care cert/re-cert  G-Codes - 93/81/82 1623    Functional Assessment Tool Used (Outpatient Only)  Quick Dash, clinical judgment, ROM and strength deficits, pain scale     Functional Limitation  Carrying, moving and handling objects    Carrying, Moving and Handling Objects Goal Status ([id number])  At least 40 percent but less than 60 percent impaired, limited or restricted    Carrying, Moving and Handling Objects Discharge Status [phone])  At least 1 percent but less than 20 percent impaired, limited or restricted        Problem List Patient Active Problem List   Diagnosis Date Noted  . Chronic, continuous use of  opioids 07/01/2015  . Chronic midline low back pain without sciatica 03/21/2015  . Acute renal failure (ARF) ([place]) 02/19/2015  . Type 2 diabetes mellitus without complication ([place]) 93/81/0175  . Cerebral palsy ([place]) 01/05/2014  . 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 Hip pain, chronic 04/28/2013  . Essential hypertension 01/10/2013  . Esophageal reflux 08/12/2012  . Primary localized osteoarthrosis, lower leg 05/07/2012    Jomarie Longs PT 03/27/2017, 7:33 AM  [facility] [address], [place], [address] Phone: [phone]   Fax:  [phone]  Name: Traci Davis MRN: [id number] Date of Birth: January 23, 1971

## 2017-03-28 ENCOUNTER — Encounter: Payer: Medicare Other | Admitting: Physical Therapy

## 2017-04-02 ENCOUNTER — Ambulatory Visit: Payer: Medicare Other | Attending: Sports Medicine | Admitting: Physical Therapy

## 2017-04-02 ENCOUNTER — Encounter: Payer: Self-pay | Admitting: Physical Therapy

## 2017-04-02 DIAGNOSIS — M6281 Muscle weakness (generalized): Secondary | ICD-10-CM | POA: Diagnosis present

## 2017-04-02 DIAGNOSIS — M25511 Pain in right shoulder: Secondary | ICD-10-CM

## 2017-04-02 DIAGNOSIS — M62838 Other muscle spasm: Secondary | ICD-10-CM | POA: Insufficient documentation

## 2017-04-02 NOTE — Therapy (Signed)
[facility] [address] [place], [address] Phone: [phone]   Fax:  [phone]  Physical Therapy Treatment  Patient Details  Name: Traci Davis MRN: [id number] Date of Birth: 10/25/1970 Referring Provider: Rosalia Hammers DO   Encounter Date: 04/02/2017  PT End of Session - 04/02/17 1522    Visit Number  2    Number of Visits  6    Date for PT Re-Evaluation  05/07/17    Authorization Type  2    Authorization Time Period  10 (G Code)    PT Start Time  1517    PT Stop Time  1610    PT Time Calculation (min)  53 min    Activity Tolerance  Patient tolerated treatment well;Patient limited by lethargy    Behavior During Therapy  [facility] for tasks assessed/performed;Flat affect       Past Medical History:  Diagnosis Date  . Anxiety   . Arthritis   . Asthma   . Cerebral palsy ([place])   . COPD (chronic obstructive pulmonary disease) ([place])   . Depression   . Diabetes mellitus without complication ([place])   . GERD (gastroesophageal reflux disease)   . Hypertension     Past Surgical History:  Procedure Laterality Date  . CESAREAN SECTION  1995  . CHOLECYSTECTOMY    . FOOT CAPSULE RELEASE W/ PERCUTANEOUS HEEL CORD LENGTHENING, TIBIAL TENDON TRANSFER     age 89 ,and 2010-both legs  . JOINT REPLACEMENT Right    both knees partial replacements dates unknown  . knee replacment     x 5 per pt. last one- left knee 2014. has had 2 on R 3 on L    There were no vitals filed for this visit.  Subjective Assessment - 04/02/17 1525    Subjective  Patient reports her right shoulder seems to be improving some and only hurts if she twists, turns her arm a certain way.     Pertinent History  Patient reports she began having right shoulder pain about 2 months ago when she had to use a manual chair because her electric chair broke. She had to reach back to shut a door and her right shoulder pain began.  Her pain has  gotten progressivly worse since then and is now radiating into her forearm.     Limitations  Lifting;House hold activities;Other (comment) sleeping    Diagnostic tests  X rays;     Patient Stated Goals  to get rid of pain and be able to use right arm again without difficulty    Currently in Pain?  Yes    Pain Score  3     Pain Location  Shoulder    Pain Orientation  Right    Pain Descriptors / Indicators  Aching    Pain Type  Acute pain    Pain Onset  More than a month ago 2 months ago    Pain Frequency  Intermittent       Objective:  Palpation: point tender along right biceps shoulder to elbow Posture: forward rounded right shoulder with patient seated  AROM: right shoulder forward elevation 120 degrees with mild pain end range Speeds test for biceps: right shoulder (+) for reproduction of symptoms  Treatment:  Therapeutic exercise: patient performed with verbal, tactile cues and demonstration of therapist:  Scapular control, retraction right and left seated, x 10 repetitions, repeated demonstration required to perform with correct technique  Modalities: Electrical  stimulation: Russian stim. 10/10 cycle applied (2) electrodes to right shoulder periscapular muscles: rhomboids, lower trapezius with patient seated with right UE supported on pillow; goal muscle re education High volt estim.clincial program for muscle spasms  (2) electrodes applied to right upper arm, anterior shoulder, intensity to tolerance with patient in seated position with right UE supported on pillow goal: pain, spasms Ultrasound: 10 min. Right shoulder goal: pain  Patient seated with pillow supporting right UE: 3MHz 1.2w/xm2 50% pulsed anterior aspect right shoulder   Patient response to treatment: patient demonstrated improved technique with exercises with repeated demonstration and VC for correct alignment. Patient with decreased pain from   3/10 to 1 /10 following treatment. Improved motor control with  repetition and cuing, following estim.      PT Education - 04/02/17 1527    Education provided  Yes    Education Details  Home program for scapular control    Person(s) Educated  Patient    Methods  Explanation;Demonstration;Verbal cues    Comprehension  Returned demonstration;Verbalized understanding;Verbal cues required;Need further instruction          PT Long Term Goals - 03/26/17 1622      PT LONG TERM GOAL #1   Title  Patient will report pain level max of 3/10 right shoulder with aggravating positions of reaching behind and lifting     Baseline  pain level ranges up to 8/10 with aggravating positions    Status  New    Target Date  04/17/17      PT LONG TERM GOAL #2   Title  Patient will report pain level max of < 3/10 right shoulder with aggravating positions of reaching behind and lifting     Baseline  pain level ranges up to 8/10 with aggravating positions    Status  New    Target Date  05/07/17      PT LONG TERM GOAL #3   Title  improved Quick Dash to 40% or less indicating improved function with right UE with less difficulty and pain    Baseline  Quick Dash 60%    Status  New    Target Date  04/17/17      PT LONG TERM GOAL #4   Title  improved Quick Dash to 20% or less indicating improved function with right UE with less difficulty and pain    Baseline  QuickDash 60%    Status  New    Target Date  05/07/17      PT LONG TERM GOAL #5   Title  Patient will be independent with home program for pain control, self managment of symptoms, exercises to allow transition to home program when discharged from physical therapy    Baseline  Patient is limited in knowledge of appropriate pain contro and exercises, precautions    Status  New    Target Date  05/07/17            Plan - 04/02/17 1522    Clinical Impression Statement  Patient demonstrates slow improvement with right shoulder pain. She continues with decreased strength and pain in right shoulder. She  responded well with decreased pain with elevation right shoulder and should continue to improve with additional physical therapy intervention.    Rehab Potential  Good    Clinical Impairments Affecting Rehab Potential  (+) acute condition, motivated, age(-)multiple co morbidities    PT Frequency  1x / week    PT Duration  6 weeks    PT  Treatment/Interventions  Cryotherapy;Electrical Stimulation;Ultrasound;Moist Heat;Therapeutic exercise;Patient/family education;Neuromuscular re-education;Manual techniques    PT Next Visit Plan  pain control, manual therapy STM, exercise instruction, progression    PT Home Exercise Plan  pain control with heat/ice, ROM exercises, scapular retraction, positions to avoid pain       Patient will benefit from skilled therapeutic intervention in order to improve the following deficits and impairments:  Decreased knowledge of precautions, Impaired UE functional use, Impaired perceived functional ability, Decreased strength, Decreased range of motion, Increased muscle spasms, Pain  Visit Diagnosis: Acute pain of right shoulder  Muscle weakness (generalized)  Other muscle spasm     Problem List Patient Active Problem List   Diagnosis Date Noted  . Chronic, continuous use of opioids 07/01/2015  . Chronic midline low back pain without sciatica 03/21/2015  . Acute renal failure (ARF) ([place]) 02/19/2015  . Type 2 diabetes mellitus without complication ([place]) 85/92/9244  . Cerebral palsy ([place]) 01/05/2014  . 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 Hip pain, chronic 04/28/2013  . Essential hypertension 01/10/2013  . Esophageal reflux 08/12/2012  . Primary localized osteoarthrosis, lower leg 05/07/2012    Jomarie Longs PT 04/02/2017, 6:16 PM  [facility] [address], [place], [address] Phone: [phone]   Fax:  [phone]  Name: Traci Davis MRN: [id number] Date  of Birth: Feb 04, 1971

## 2017-04-08 ENCOUNTER — Emergency Department: Payer: Medicare Other

## 2017-04-08 ENCOUNTER — Encounter: Payer: Self-pay | Admitting: Emergency Medicine

## 2017-04-08 ENCOUNTER — Other Ambulatory Visit: Payer: Self-pay

## 2017-04-08 ENCOUNTER — Emergency Department
Admission: EM | Admit: 2017-04-08 | Discharge: 2017-04-08 | Disposition: A | Discharge status: Home / Self Care | Payer: Medicare Other | Attending: Emergency Medicine | Admitting: Emergency Medicine

## 2017-04-08 DIAGNOSIS — J449 Chronic obstructive pulmonary disease, unspecified: Secondary | ICD-10-CM | POA: Diagnosis not present

## 2017-04-08 DIAGNOSIS — G8929 Other chronic pain: Secondary | ICD-10-CM | POA: Diagnosis not present

## 2017-04-08 DIAGNOSIS — I10 Essential (primary) hypertension: Secondary | ICD-10-CM | POA: Diagnosis not present

## 2017-04-08 DIAGNOSIS — E119 Type 2 diabetes mellitus without complications: Secondary | ICD-10-CM | POA: Insufficient documentation

## 2017-04-08 DIAGNOSIS — R079 Chest pain, unspecified: Secondary | ICD-10-CM | POA: Diagnosis not present

## 2017-04-08 DIAGNOSIS — Z79899 Other long term (current) drug therapy: Secondary | ICD-10-CM | POA: Insufficient documentation

## 2017-04-08 DIAGNOSIS — J45909 Unspecified asthma, uncomplicated: Secondary | ICD-10-CM | POA: Insufficient documentation

## 2017-04-08 DIAGNOSIS — Z7984 Long term (current) use of oral hypoglycemic drugs: Secondary | ICD-10-CM | POA: Diagnosis not present

## 2017-04-08 LAB — BASIC METABOLIC PANEL
ANION GAP: 11 (ref 5–15)
BUN: 14 mg/dL (ref 6–20)
CO2: 22 mmol/L (ref 22–32)
Calcium: 9.5 mg/dL (ref 8.9–10.3)
Chloride: 106 mmol/L (ref 101–111)
Creatinine, Ser: 0.93 mg/dL (ref 0.44–1.00)
GFR calc Af Amer: >60 mL/min (ref >60)
Glucose, Bld: 71 mg/dL (ref 65–99)
POTASSIUM: 3 mmol/L — AB (ref 3.5–5.1)
SODIUM: 139 mmol/L (ref 135–145)

## 2017-04-08 LAB — CBC
HCT: 47.3 % — ABNORMAL HIGH (ref 35.0–47.0)
HEMOGLOBIN: 15.2 g/dL (ref 12.0–16.0)
MCH: 27.9 pg (ref 26.0–34.0)
MCHC: 32.1 g/dL (ref 32.0–36.0)
MCV: 86.7 fL (ref 80.0–100.0)
Platelets: 206 10*3/uL (ref 150–440)
RBC: 5.46 MIL/uL — ABNORMAL HIGH (ref 3.80–5.20)
RDW: 14.6 % — AB (ref 11.5–14.5)
WBC: 9.8 10*3/uL (ref 3.6–11.0)

## 2017-04-08 LAB — TROPONIN I

## 2017-04-08 MED ORDER — LORAZEPAM 1 MG PO TABS
1.0000 mg | ORAL_TABLET | Freq: Once | ORAL | Status: AC
  Administered 2017-04-08: 1 mg via ORAL
  Filled 2017-04-08: qty 1

## 2017-04-08 NOTE — ED Notes (Signed)
Pt brought in via ems from home.  Pt has chest pain since 1700 today.  No chest pain now.  No sob.  Nonsmoker. No cough. No fever.  nonradiaitng pain in left chest.  Pt alert.  Iv in place. nsr on monitor.  Skin warm and dry.

## 2017-04-08 NOTE — ED Notes (Signed)
Pt ate dinner tray before discharge.

## 2017-04-08 NOTE — ED Notes (Signed)
No chest pain.  nsr on monitor.  Pt alert.

## 2017-04-08 NOTE — ED Provider Notes (Signed)
[facility] Emergency Department Provider Note  ____________________________________________   First MD Initiated Contact with Patient 04/08/17 2101     (approximate)  I have reviewed the triage vital signs and the nursing notes.   HISTORY  Chief Complaint Chest Pain   HPI Traci Davis is a 46 y.o. female a history of COPD, diabetes as well as hypertension and cerebral palsy who is presenting to the emergency department today with shortness of breath as well as chest pain.  She says that she is feeling short of breath over the past 3 days.  She says that she has been "wheezy."  She is denying any fever, chills, cough or runny nose.  No known sick contacts.  Said that she was taking a breathing treatment at 5 PM today when she began to develop left-sided chest pain.  She described it as a cramping pain that was an 8 out of 10 that was nonradiating.  No associated nausea, vomiting or diaphoresis.  She says that after taking the breathing treatment she no longer has the wheezing feeling but the chest pain has stayed.  However, it has reduced over the past several hours and is only a very mild amount of pain at this time.  Patient says that she thinks she may be anxious because she is not allowed to take her benzodiazepines anymore.  Says that she is in chronic pain management and had a choice either between her anxiety meds or her pain meds.  She chose the pain meds and is therefore not been on her anxiety meds in about a year and a half.  However, she does admit still being anxious and with panic at different situations.  She said that she has had chest pain before with panic attacks.  Past Medical History:  Diagnosis Date  . Anxiety   . Arthritis   . Asthma   . Cerebral palsy ([place])   . COPD (chronic obstructive pulmonary disease) ([place])   . Depression   . Diabetes mellitus without complication ([place])   . GERD (gastroesophageal reflux disease)   . Hypertension      Patient Active Problem List   Diagnosis Date Noted  . Chronic, continuous use of opioids 07/01/2015  . Chronic midline low back pain without sciatica 03/21/2015  . Acute renal failure (ARF) ([place]) 02/19/2015  . Type 2 diabetes mellitus without complication ([place]) 27/25/3664  . Cerebral palsy ([place]) 01/05/2014  . 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 Hip pain, chronic 04/28/2013  . Essential hypertension 01/10/2013  . Esophageal reflux 08/12/2012  . Primary localized osteoarthrosis, lower leg 05/07/2012    Past Surgical History:  Procedure Laterality Date  . CESAREAN SECTION  1995  . CHOLECYSTECTOMY    . FOOT CAPSULE RELEASE W/ PERCUTANEOUS HEEL CORD LENGTHENING, TIBIAL TENDON TRANSFER     age 53 ,and 2010-both legs  . JOINT REPLACEMENT Right    both knees partial replacements dates unknown  . knee replacment     x 5 per pt. last one- left knee 2014. has had 2 on R 3 on L    Prior to Admission medications   Medication Sig Start Date End Date Taking? Authorizing Provider  albuterol (PROVENTIL HFA;VENTOLIN HFA) 108 (90 BASE) MCG/ACT inhaler Inhale 2 puffs into the lungs 4 (four) times daily as needed. For shortness of breath and/or wheezing 09/22/14   [provider]  ARIPiprazole (ABILIFY) 5 MG tablet Take 5 mg by mouth daily.    [provider]  Blood Glucose Monitoring Suppl (GLUCOCOM BLOOD GLUCOSE MONITOR) DEVI Test daily before all meals/snacks and once before bedtime. 09/07/14   [provider]  budesonide-formoterol (SYMBICORT) 160-4.5 MCG/ACT inhaler 2 PUMP(S) INHALATION TWO TIMES A DAY 09/29/15   [provider]  CREON 36000 UNITS CPEP capsule Take 48,185-63,149 Units by mouth See admin instructions. Take 72000 units oral three times a day with meals, and take 36000 unit orally two times a day with snack. 02/12/15   [provider]  DULoxetine (CYMBALTA) 60 MG capsule Take 120 mg by mouth daily.     [provider]  furosemide (LASIX) 40 MG tablet Take 40 mg by mouth.    [provider]  gabapentin (NEURONTIN) 300 MG capsule Take 600 mg by mouth 3 (three) times daily.     [provider]  glimepiride (AMARYL) 4 MG tablet Take 4 mg by mouth daily with breakfast.    [provider]  glucose blood (ONE TOUCH ULTRA TEST) test strip USE TO TEST BLOOD SUGAR TWO TIMES A DAY 12/07/14   [provider]  hydrochlorothiazide (HYDRODIURIL) 25 MG tablet Take 25 mg by mouth daily.    [provider]  incobotulinumtoxinA (XEOMIN) 100 units SOLR injection Inject into the muscle. 03/29/15   [provider]  Influenza Vac Typ A&B Surf Ant (FLUVIRIN) 0.5 ML SUSY Inject into the muscle.    [provider]  ketorolac (TORADOL) 10 MG tablet Take 1 tablet (10 mg total) by mouth every 6 (six) hours as needed. Patient not taking: Reported on 03/26/2017 08/25/16   Victorino Dike, FNP  Lancets (FREESTYLE) lancets Test daily before all meals/snacks 09/07/14   [provider]  lisinopril (PRINIVIL,ZESTRIL) 20 MG tablet Take 20 mg by mouth daily.    [provider]  lubiprostone (AMITIZA) 24 MCG capsule Take 24 mcg by mouth 2 (two) times daily as needed for constipation.     [provider]  metFORMIN (GLUCOPHAGE) 500 MG tablet Take by mouth.    [provider]  methocarbamol (ROBAXIN) 750 MG tablet Take 750 mg by mouth 3 (three) times daily.    [provider]  mometasone-formoterol (DULERA) 200-5 MCG/ACT AERO Frequency:PRN   Dosage:0.0     Instructions:  Note:Dose: 200-5 MCG 06/05/12   [provider]  naloxone [facility]) nasal spray 4 mg/0.1 mL One spray in either nostril once for known/suspected opioid overdose. May repeat every 2-3 minutes in alternating nostril til EMS arrives 02/25/15   [provider]  nystatin (MYCOSTATIN/NYSTOP) powder 1 application daily. 04/10/13   [provider]    omeprazole (PRILOSEC) 40 MG capsule Take 40 mg by mouth 2 (two) times daily.    [provider]  oxyCODONE (OXY IR/ROXICODONE) 5 MG immediate release tablet Take 5 mg by mouth every 8 (eight) hours as needed for moderate pain or severe pain.     [provider]  pioglitazone (ACTOS) 45 MG tablet Take 45 mg by mouth daily.    [provider]  polyethylene glycol (MIRALAX / GLYCOLAX) packet Use 17 gram or 1 capful in 8 oz of fluid of your choice three times a day until having soft stools, then back down to once a day. 09/28/15   [provider]  potassium chloride SA (K-DUR,KLOR-CON) 20 MEQ tablet Take 20 mEq by mouth 2 (two) times daily.    [provider]  pregabalin (LYRICA) 50 MG capsule Take 50 mg by mouth. 05/01/16  [provider]  promethazine (PHENERGAN) 25 MG tablet Take 25 mg by mouth 3 (three) times daily as needed for nausea or vomiting.     [provider]  solifenacin (VESICARE) 5 MG tablet Take 5 mg by mouth daily.    [provider]  tiotropium (SPIRIVA HANDIHALER) 18 MCG inhalation capsule Place into inhaler and inhale. 07/08/13   [provider]  topiramate (TOPAMAX) 100 MG tablet Take 100 mg by mouth daily. 07/17/16   [provider]    Allergies Cephalexin; Meloxicam; Baclofen; Morphine and related; Vantin  [cefpodoxime]; Ciprofloxacin; and Tape  Family History  Problem Relation Age of Onset  . CAD Father   . Heart attack Brother     Social History Social History   Tobacco Use  . Smoking status: Never Smoker  . Smokeless tobacco: Never Used  Substance Use Topics  . Alcohol use: No  . Drug use: No    Review of Systems  Constitutional: No fever/chills Eyes: No visual changes. ENT: No sore throat. Cardiovascular: As above Respiratory: As above Gastrointestinal: No abdominal pain.  No nausea, no vomiting.  No diarrhea.  No constipation. Genitourinary: Negative for  dysuria. Musculoskeletal: Negative for back pain. Skin: Negative for rash. Neurological: Negative for headaches, focal weakness or numbness.   ____________________________________________   PHYSICAL EXAM:  VITAL SIGNS: ED Triage Vitals  Enc Vitals Group     BP 04/08/17 1832 121/66     Pulse Rate 04/08/17 1832 85     Resp 04/08/17 1832 18     Temp 04/08/17 1832 98.4 F (36.9 C)     Temp Source 04/08/17 1832 Oral     SpO2 04/08/17 1832 100 %     Weight 04/08/17 1832 223 lb (101.2 kg)     Height 04/08/17 1832 5\' 2"  (1.575 m)     Head Circumference --      Peak Flow --      Pain Score 04/08/17 1836 6     Pain Loc --      Pain Edu? --      Excl. in [place]? --     Constitutional: Alert and oriented. Well appearing and in no acute distress. Eyes: Conjunctivae are normal.  Head: Atraumatic. Nose: No congestion/rhinnorhea. Mouth/Throat: Mucous membranes are moist.  Neck: No stridor.   Cardiovascular: Normal rate, regular rhythm. Grossly normal heart sounds.  Chest pain mildly reproducible over the left side of the chest anteriorly. Respiratory: Normal respiratory effort.  No retractions. Lungs CTAB. Gastrointestinal: Soft and nontender. No distention.  Musculoskeletal: No lower extremity tenderness nor edema.  No joint effusions.  Where his left lower extremity leg brace. Neurologic:  Normal speech and language. No gross focal neurologic deficits are appreciated. Skin:  Skin is warm, dry and intact. No rash noted. Psychiatric: Mood and affect are normal. Speech and behavior are normal.  ____________________________________________   LABS (all labs ordered are listed, but only abnormal results are displayed)  Labs Reviewed  BASIC METABOLIC PANEL - Abnormal; Notable for the following components:      Result Value   Potassium 3.0 (*)    All other components within normal limits  CBC - Abnormal; Notable for the following components:   RBC 5.46 (*)    HCT 47.3 (*)    RDW 14.6  (*)    All other components within normal limits  TROPONIN I  TROPONIN I   ____________________________________________  EKG  ED ECG REPORT I, Doran Stabler, the attending physician, personally  viewed and interpreted this ECG.   Date: 04/08/2017  EKG Time: 1828  Rate: 81  Rhythm: normal sinus rhythm  Axis: Normal  Intervals:none  ST&T Change: T wave inversions in V2 and V3.  No ST elevations or depressions.  Unchanged EKG today from previous of 2015.  ____________________________________________  RADIOLOGY  No active disease on the chest x-ray ____________________________________________   PROCEDURES  Procedure(s) performed:   Procedures  Critical Care performed:   ____________________________________________   INITIAL IMPRESSION / ASSESSMENT AND PLAN / ED COURSE  Pertinent labs & imaging results that were available during my care of the patient were reviewed by me and considered in my medical decision making (see chart for details).  Differential diagnosis includes, but is not limited to, ACS, aortic dissection, pulmonary embolism, cardiac tamponade, pneumothorax, pneumonia, pericarditis, myocarditis, GI-related causes including esophagitis/gastritis, and musculoskeletal chest wall pain.    As part of my medical decision making, I reviewed the following data within the [place] chart reviewed  ----------------------------------------- 10:35 PM on 04/08/2017 -----------------------------------------  Patient is now pain-free after Ativan.  Second troponin is normal.  Heart rate in the 80s.  Given the overall clinical picture I feel that the etiology is likely anxiety related.  However, the patient does have multiple risk factors including diabetes, family history of heart disease as well as hypertension.  The patient will be able to be discharged home at this time.  However, we did discuss following up with cardiology.  Unlikely to be PE.   Pain relieved with Ativan, no swelling of the bilateral lower extremities although there is a mobilization of the left lower externally.  However, there is no swelling or pain there.  Also without any hypoxia or shortness of breath at this time.  Discussed the diagnosis as well as the treatment plan with the patient and she is understanding and willing to comply.     ____________________________________________   FINAL CLINICAL IMPRESSION(S) / ED DIAGNOSES  Chest pain.    NEW MEDICATIONS STARTED DURING THIS VISIT:  This SmartLink is deprecated. Use AVSMEDLIST instead to display the medication list for a patient.   Note:  This document was prepared using Dragon voice recognition software and may include unintentional dictation errors.     Orbie Pyo, MD 04/08/17 2236

## 2017-04-08 NOTE — ED Triage Notes (Signed)
Pt c/o left sided chest pain with SHOB that started 2 hrs ago.  Pain is constant and achy.  Does not radiate.  Unlabored breathing. VSS.  Color WNL

## 2017-04-08 NOTE — ED Notes (Signed)
Pt signed e signature.  Iv dc'ed.  D/cc inst to pt.

## 2017-04-10 ENCOUNTER — Ambulatory Visit: Payer: Medicare Other | Admitting: Physical Therapy

## 2017-04-15 ENCOUNTER — Ambulatory Visit: Payer: Medicare Other | Admitting: Physical Therapy

## 2017-04-15 DIAGNOSIS — F411 Generalized anxiety disorder: Secondary | ICD-10-CM | POA: Insufficient documentation

## 2017-04-15 DIAGNOSIS — F410 Panic disorder [episodic paroxysmal anxiety] without agoraphobia: Secondary | ICD-10-CM | POA: Insufficient documentation

## 2017-04-15 DIAGNOSIS — M25511 Pain in right shoulder: Secondary | ICD-10-CM | POA: Diagnosis not present

## 2017-04-15 DIAGNOSIS — M6281 Muscle weakness (generalized): Secondary | ICD-10-CM

## 2017-04-15 DIAGNOSIS — Z8659 Personal history of other mental and behavioral disorders: Secondary | ICD-10-CM | POA: Insufficient documentation

## 2017-04-15 DIAGNOSIS — N3944 Nocturnal enuresis: Secondary | ICD-10-CM | POA: Insufficient documentation

## 2017-04-15 DIAGNOSIS — R51 Headache: Secondary | ICD-10-CM

## 2017-04-15 DIAGNOSIS — R519 Headache, unspecified: Secondary | ICD-10-CM | POA: Insufficient documentation

## 2017-04-15 DIAGNOSIS — M62838 Other muscle spasm: Secondary | ICD-10-CM

## 2017-04-15 DIAGNOSIS — G3184 Mild cognitive impairment, so stated: Secondary | ICD-10-CM | POA: Insufficient documentation

## 2017-04-15 NOTE — Therapy (Signed)
[facility] [address], [place], [address] Phone: [phone]   Fax:  [phone]  Physical Therapy Treatment  Patient Details  Name: Traci Davis MRN: [id number] Date of Birth: 01/18/1971 Referring Provider: Rosalia Hammers DO   Encounter Date: 04/15/2017  PT End of Session - 04/15/17 1602    Visit Number  3    Number of Visits  6    Date for PT Re-Evaluation  05/07/17    Authorization Type  3    Authorization Time Period  10 (G Code)    PT Start Time  1600    PT Stop Time  1638    PT Time Calculation (min)  38 min    Activity Tolerance  Patient tolerated treatment well;Patient limited by lethargy    Behavior During Therapy  [employer] for tasks assessed/performed;Flat affect       Past Medical History:  Diagnosis Date  . Anxiety   . Arthritis   . Asthma   . Cerebral palsy ([place])   . COPD (chronic obstructive pulmonary disease) ([place])   . Depression   . Diabetes mellitus without complication ([place])   . GERD (gastroesophageal reflux disease)   . Hypertension     Past Surgical History:  Procedure Laterality Date  . CESAREAN SECTION  1995  . CHOLECYSTECTOMY    . FOOT CAPSULE RELEASE W/ PERCUTANEOUS HEEL CORD LENGTHENING, TIBIAL TENDON TRANSFER     age 46 ,and 2010-both legs  . JOINT REPLACEMENT Right    both knees partial replacements dates unknown  . knee replacment     x 5 per pt. last one- left knee 2014. has had 2 on R 3 on L    There were no vitals filed for this visit.  Subjective Assessment - 04/15/17 1605    Subjective  Patient reports she is running late from doctor appointment and her right shoulder has increased pain today and it's difficulty to raise it due to the pain.     Pertinent History  Patient reports she began having right shoulder pain about 2 months ago when she had to use a manual chair because her electric chair broke. She had to reach back to shut a door and her right  shoulder pain began.  Her pain has gotten progressivly worse since then and is now radiating into her forearm.     Limitations  Lifting;House hold activities;Other (comment) sleeping    Diagnostic tests  X rays;     Patient Stated Goals  to get rid of pain and be able to use right arm again without difficulty    Currently in Pain?  Yes    Pain Score  8     Pain Location  Shoulder    Pain Orientation  Right    Pain Descriptors / Indicators  Aching;Tightness    Pain Type  Acute pain    Pain Onset  More than a month ago 2 months ago    Pain Frequency  Intermittent          Objective:  Palpation: point tender along right biceps shoulder to elbow Posture: forward rounded right shoulder with patient seated  AROM: right shoulder forward elevation 900 degrees with mild pain end range   Treatment:  Manual therapy: 10 min. Goal: pain, spasms  STM performed to right shoulder girdle, upper trapezius and anterior aspect of shoulder with patient seated with right UE supported on pillows  Modalities: Electrical stimulation: 15 [place]  stim. 10/10 cycle applied (2) electrodes to right shoulder periscapular muscles: rhomboids, lower trapezius with patient seated with right UE supported on pillow; goal muscle re education High volt estim.clincial program for muscle spasms  (2) electrodes applied to right upper arm, anterior shoulder, intensity to tolerance with patient in seated position with right UE supported on pillow goal: pain, spasms  Patient response to treatment: Patient able to raise right UE above shoulder level to 130 degrees with mild symptoms and pain level decreased from 8/10 to 4/10 at end of session.      PT Education - 04/15/17 1630    Education provided  Yes    Education Details  re assessed scapular retraction exercise, continued posture and ROM exercises     Person(s) Educated  Patient    Methods  Explanation;Demonstration;Verbal cues    Comprehension  Verbalized  understanding;Returned demonstration          PT Long Term Goals - 03/26/17 1622      PT LONG TERM GOAL #1   Title  Patient will report pain level max of 3/10 right shoulder with aggravating positions of reaching behind and lifting     Baseline  pain level ranges up to 8/10 with aggravating positions    Status  New    Target Date  04/17/17      PT LONG TERM GOAL #2   Title  Patient will report pain level max of < 3/10 right shoulder with aggravating positions of reaching behind and lifting     Baseline  pain level ranges up to 8/10 with aggravating positions    Status  New    Target Date  05/07/17      PT LONG TERM GOAL #3   Title  improved Quick Dash to 40% or less indicating improved function with right UE with less difficulty and pain    Baseline  Quick Dash 60%    Status  New    Target Date  04/17/17      PT LONG TERM GOAL #4   Title  improved Quick Dash to 20% or less indicating improved function with right UE with less difficulty and pain    Baseline  QuickDash 60%    Status  New    Target Date  05/07/17      PT LONG TERM GOAL #5   Title  Patient will be independent with home program for pain control, self managment of symptoms, exercises to allow transition to home program when discharged from physical therapy    Baseline  Patient is limited in knowledge of appropriate pain contro and exercises, precautions    Status  New    Target Date  05/07/17            Plan - 04/15/17 1641    Clinical Impression Statement  Patient with improved ability to raise arm overhead with less pain and diffiulty at end of session from 90 degrees pre treatment to 130 degrees folloiwng estim, STM.     Rehab Potential  Good    Clinical Impairments Affecting Rehab Potential  (+) acute condition, motivated, age(-)multiple co morbidities    PT Frequency  1x / week    PT Duration  6 weeks    PT Treatment/Interventions  Cryotherapy;Electrical Stimulation;Ultrasound;Moist Heat;Therapeutic  exercise;Patient/family education;Neuromuscular re-education;Manual techniques    PT Next Visit Plan  pain control, manual therapy STM, exercise instruction, progression    PT Home Exercise Plan  pain control with heat/ice, ROM exercises, scapular retraction, positions  to avoid pain       Patient will benefit from skilled therapeutic intervention in order to improve the following deficits and impairments:  Decreased knowledge of precautions, Impaired UE functional use, Impaired perceived functional ability, Decreased strength, Decreased range of motion, Increased muscle spasms, Pain  Visit Diagnosis: Acute pain of right shoulder  Muscle weakness (generalized)  Other muscle spasm     Problem List Patient Active Problem List   Diagnosis Date Noted  . Chronic, continuous use of opioids 07/01/2015  . Chronic midline low back pain without sciatica 03/21/2015  . Acute renal failure (ARF) ([place]) 02/19/2015  . Type 2 diabetes mellitus without complication ([place]) 78/58/8502  . Cerebral palsy ([place]) 01/05/2014  . 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 Hip pain, chronic 04/28/2013  . Essential hypertension 01/10/2013  . Esophageal reflux 08/12/2012  . Primary localized osteoarthrosis, lower leg 05/07/2012    Jomarie Longs PT 04/16/2017, 7:14 PM  [facility] [address] [place], [address] Phone: [phone]   Fax:  [phone]  Name: Traci Davis MRN: [id number] Date of Birth: 1970/05/09

## 2017-04-17 ENCOUNTER — Ambulatory Visit: Payer: Medicare Other | Admitting: Physical Therapy

## 2017-04-18 ENCOUNTER — Ambulatory Visit: Payer: Medicare Other | Admitting: Physical Therapy

## 2017-05-06 ENCOUNTER — Encounter: Payer: Self-pay | Admitting: Physical Therapy

## 2017-05-06 ENCOUNTER — Ambulatory Visit: Payer: Medicare Other | Attending: Sports Medicine | Admitting: Physical Therapy

## 2017-05-06 DIAGNOSIS — M62838 Other muscle spasm: Secondary | ICD-10-CM | POA: Diagnosis present

## 2017-05-06 DIAGNOSIS — M25511 Pain in right shoulder: Secondary | ICD-10-CM

## 2017-05-06 DIAGNOSIS — M6281 Muscle weakness (generalized): Secondary | ICD-10-CM | POA: Diagnosis present

## 2017-05-06 NOTE — Therapy (Signed)
[facility] [address] [place], [address] Phone: [phone]   Fax:  [phone]  Physical Therapy Treatment  Patient Details  Name: Traci Davis MRN: [id number] Date of Birth: 01-06-1971 Referring Provider: Rosalia Hammers DO   Encounter Date: 05/06/2017  PT End of Session - 05/06/17 1531    Visit Number  4    Number of Visits  6    Date for PT Re-Evaluation  06/03/17    Authorization Type  3    Authorization Time Period  10 (G Code)    PT Start Time  1522    PT Stop Time  1605    PT Time Calculation (min)  43 min    Activity Tolerance  Patient tolerated treatment well;Patient limited by lethargy    Behavior During Therapy  [facility] for tasks assessed/performed       Past Medical History:  Diagnosis Date  . Anxiety   . Arthritis   . Asthma   . Cerebral palsy ([place])   . COPD (chronic obstructive pulmonary disease) ([place])   . Depression   . Diabetes mellitus without complication ([place])   . GERD (gastroesophageal reflux disease)   . Hypertension     Past Surgical History:  Procedure Laterality Date  . CESAREAN SECTION  1995  . CHOLECYSTECTOMY    . FOOT CAPSULE RELEASE W/ PERCUTANEOUS HEEL CORD LENGTHENING, TIBIAL TENDON TRANSFER     age 47 ,and 2010-both legs  . JOINT REPLACEMENT Right    both knees partial replacements dates unknown  . knee replacment     x 5 per pt. last one- left knee 2014. has had 2 on R 3 on L    There were no vitals filed for this visit.  Subjective Assessment - 05/06/17 1527    Subjective  Patient reports right shoulder is improving and is still bothering her with increased activity. she feels therapy is beneficial and would like to continue to improve pain and be able to use her arm without pain.     Pertinent History  Patient reports she began having right shoulder pain about 2 months ago when she had to use a manual chair because her electric chair broke. She had to  reach back to shut a door and her right shoulder pain began.  Her pain has gotten progressivly worse since then and is now radiating into her forearm.     Limitations  Lifting;House hold activities;Other (comment) sleeping    Diagnostic tests  X rays;     Patient Stated Goals  to get rid of pain and be able to use right arm again without difficulty    Currently in Pain?  Yes    Pain Score  5  took medication muscle relaxant ~45 min. ago     Pain Location  Shoulder    Pain Orientation  Right    Pain Descriptors / Indicators  Aching    Pain Type  Acute pain    Pain Onset  More than a month ago October 2018    Pain Frequency  Intermittent         [facility] PT Assessment - 05/07/17 0001      Assessment   Medical Diagnosis  Acute right shoulder pain    Referring Provider  Rosalia Hammers DO    Onset Date/Surgical Date  12/29/16    Hand Dominance  Right      Prior Function   Level of Independence  Independent with household mobility with device;Independent with gait;Independent with transfers;Requires assistive device for independence;Needs assistance with ADLs;Needs assistance with homemaking    Dressing  Minimal    Vocation  On disability    Leisure  Spending time with family outside.       Objective:  Palpation: point tender along right biceps shoulder to elbow Posture: forward rounded right shoulder with patient seated  AROM: right shoulder forward elevation 120 degrees with mild pain end range   Treatment:  Manual therapy: 13 min. Goal: spasms, pain  STM performed to right shoulder anterior aspect along biceps tendon, right upper trapezius and along cervical spine paraspinal muscles with superficial techniques with patient seated in chair with right UE supported on pillow  Modalities: Electrical stimulation: Russian stim. 10/10 cycle applied (2) electrodes to right shoulder periscapular muscles: rhomboids, lower trapezius with patient seated with right UE supported on pillow;  goal muscle re education High volt estim.clincial program for muscle spasms  (2) electrodes applied to right upper arm, anterior shoulder, intensity to tolerance with patient in seated position with right UE supported on pillow with moist heat applied to same goal: pain, spasms Ultrasound: 10 min. Right shoulder goal: pain  Patient seated with pillow supporting right UE: 3MHz 1.2w/xm2 50% pulsed anterior aspect right shoulder   Patient response to treatment: Patient with increased tenderness during STM to anterior aspect of shoulder, no worse at end of session. Improve pain level to mild at end of session following modalities. No adverse reactions noted following moist heat/modalities    PT Education - 05/06/17 1530    Education provided  Yes    Education Details  continue with ROM and strengthening exercises for scapular retraction and right shoulder     Person(s) Educated  Patient    Methods  Explanation;Demonstration;Verbal cues    Comprehension  Verbal cues required;Returned demonstration;Verbalized understanding          PT Long Term Goals - 05/06/17 1911      PT LONG TERM GOAL #1   Title  Patient will report pain level max of 3/10 right shoulder with aggravating positions of reaching behind and lifting     Baseline  pain level ranges up to 8/10 with aggravating positions; 05/06/17 pain continues to range up to 8-10/10 with aggravating activties    Status  Revised    Target Date  06/03/17      PT LONG TERM GOAL #2   Title  Patient will report pain level max of < 3/10 right shoulder with aggravating positions of reaching behind and lifting     Baseline  pain level ranges up to 8/10 with aggravating positions    Status  Revised    Target Date  06/03/17      PT LONG TERM GOAL #3   Title  improved Quick Dash to 40% or less indicating improved function with right UE with less difficulty and pain    Baseline  Quick Dash 60%; 05/06/17 45%    Status  Partially Met    Target Date   06/03/17      PT LONG TERM GOAL #4   Title  improved Quick Dash to 20% or less indicating improved function with right UE with less difficulty and pain    Baseline  QuickDash 60%    Status  Revised    Target Date  06/03/17      PT LONG TERM GOAL #5   Title  Patient will be independent with home program for  pain control, self managment of symptoms, exercises to allow transition to home program when discharged from physical therapy    Baseline  Patient is limited in knowledge of appropriate pain contro and exercises, precautions    Status  Revised    Target Date  06/03/17            Plan - 05/06/17 1607    Clinical Impression Statement  Patient is making steady progress with decreasing symptoms in right shoulder with exacerbation of pain with increased activity/use. She is responding well to therapy interventions including modalities for pain control and to decrease spasms and exercises for strength and ROM in order to improve function. She continues with 40% impariment based on  Quick dash, patient interview and pain. she will benefit from continued physical thearpy intervention to address limtations in order to transition to [facility] home program and self managment.     Rehab Potential  Good    Clinical Impairments Affecting Rehab Potential  (+) acute condition, motivated, age(-)multiple co morbidities    PT Frequency  1x / week    PT Duration  4 weeks    PT Treatment/Interventions  Cryotherapy;Electrical Stimulation;Ultrasound;Moist Heat;Therapeutic exercise;Patient/family education;Neuromuscular re-education;Manual techniques    PT Next Visit Plan  pain control, manual therapy STM, exercise instruction, progression    PT Home Exercise Plan  pain control with heat/ice, ROM exercises, scapular retraction, positions to avoid pain    Consulted and Agree with Plan of Care  Patient       Patient will benefit from skilled therapeutic intervention in order to improve the following  deficits and impairments:  Decreased knowledge of precautions, Impaired UE functional use, Impaired perceived functional ability, Decreased strength, Decreased range of motion, Increased muscle spasms, Pain  Visit Diagnosis: Acute pain of right shoulder - Plan: PT plan of care cert/re-cert  Muscle weakness (generalized) - Plan: PT plan of care cert/re-cert  Other muscle spasm - Plan: PT plan of care cert/re-cert     Problem List Patient Active Problem List   Diagnosis Date Noted  . Chronic, continuous use of opioids 07/01/2015  . Chronic midline low back pain without sciatica 03/21/2015  . Acute renal failure (ARF) ([place]) 02/19/2015  . Type 2 diabetes mellitus without complication ([place]) 88/87/5797  . Cerebral palsy ([place]) 01/05/2014  . 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 Hip pain, chronic 04/28/2013  . Essential hypertension 01/10/2013  . Esophageal reflux 08/12/2012  . Primary localized osteoarthrosis, lower leg 05/07/2012    Jomarie Longs PT 05/07/2017, 7:19 PM  [facility] [address], [place], [address] Phone: [phone]   Fax:  [phone]  Name: Traci Davis MRN: [id number] Date of Birth: December 24, 1970

## 2017-05-09 ENCOUNTER — Encounter: Payer: Medicare Other | Admitting: Physical Therapy

## 2017-05-13 ENCOUNTER — Encounter: Payer: Medicare Other | Admitting: Physical Therapy

## 2017-05-14 ENCOUNTER — Ambulatory Visit: Payer: Medicare Other | Admitting: Physical Therapy

## 2017-05-14 ENCOUNTER — Ambulatory Visit (INDEPENDENT_AMBULATORY_CARE_PROVIDER_SITE_OTHER): Payer: Medicare Other | Admitting: Psychiatry

## 2017-05-14 ENCOUNTER — Other Ambulatory Visit: Payer: Self-pay

## 2017-05-14 ENCOUNTER — Encounter: Payer: Self-pay | Admitting: Psychiatry

## 2017-05-14 VITALS — BP 107/72 | HR 78 | Ht 62.0 in

## 2017-05-14 DIAGNOSIS — F330 Major depressive disorder, recurrent, mild: Secondary | ICD-10-CM

## 2017-05-14 DIAGNOSIS — F411 Generalized anxiety disorder: Secondary | ICD-10-CM

## 2017-05-14 MED ORDER — FLUOXETINE HCL 20 MG PO CAPS
20.0000 mg | ORAL_CAPSULE | Freq: Every day | ORAL | 1 refills | Status: DC
Start: 2017-05-14 — End: 2017-06-17

## 2017-05-14 MED ORDER — ZOLPIDEM TARTRATE 5 MG PO TABS: 5.0000 mg | ORAL_TABLET | Freq: Every evening | ORAL | 0 refills | Status: DC | PRN

## 2017-05-14 MED ORDER — HYDROXYZINE PAMOATE 25 MG PO CAPS: 25.0000 mg | ORAL_CAPSULE | Freq: Three times a day (TID) | ORAL | 1 refills | Status: DC | PRN

## 2017-05-14 MED ORDER — DULOXETINE HCL 60 MG PO CPEP: 60.0000 mg | ORAL_CAPSULE | Freq: Every day | ORAL | 2 refills | Status: DC

## 2017-05-14 MED ORDER — ARIPIPRAZOLE 5 MG PO TABS: 5.0000 mg | ORAL_TABLET | Freq: Every day | ORAL | 2 refills | Status: DC

## 2017-05-14 NOTE — Patient Instructions (Signed)
Fluoxetine capsules or tablets (Depression/Mood Disorders) What is this medicine? FLUOXETINE (floo OX e teen) belongs to a class of drugs known as selective serotonin reuptake inhibitors (SSRIs). It helps to treat mood problems such as depression, obsessive compulsive disorder, and panic attacks. It can also treat certain eating disorders. This medicine may be used for other purposes; ask your health care provider or pharmacist if you have questions. COMMON BRAND NAME(S): Prozac What should I tell my health care provider before I take this medicine? They need to know if you have any of these conditions: -bipolar disorder or a family history of bipolar disorder -bleeding disorders -glaucoma -heart disease -liver disease -low levels of sodium in the blood -seizures -suicidal thoughts, plans, or attempt; a previous suicide attempt by you or a family member -take MAOIs like Carbex, Eldepryl, Marplan, Nardil, and Parnate -take medicines that treat or prevent blood clots -thyroid disease -an unusual or allergic reaction to fluoxetine, other medicines, foods, dyes, or preservatives -pregnant or trying to get pregnant -breast-feeding How should I use this medicine? Take this medicine by mouth with a glass of water. Follow the directions on the prescription label. You can take this medicine with or without food. Take your medicine at regular intervals. Do not take it more often than directed. Do not stop taking this medicine suddenly except upon the advice of your doctor. Stopping this medicine too quickly may cause serious side effects or your condition may worsen. A special MedGuide will be given to you by the pharmacist with each prescription and refill. Be sure to read this information carefully each time. Talk to your pediatrician regarding the use of this medicine in children. While this drug may be prescribed for children as young as 7 years for selected conditions, precautions do  apply. Overdosage: If you think you have taken too much of this medicine contact a poison control center or emergency room at once. NOTE: This medicine is only for you. Do not share this medicine with others. What if I miss a dose? If you miss a dose, skip the missed dose and go back to your regular dosing schedule. Do not take double or extra doses. What may interact with this medicine? Do not take this medicine with any of the following medications: -other medicines containing fluoxetine, like Sarafem or Symbyax -cisapride -linezolid -MAOIs like Carbex, Eldepryl, Marplan, Nardil, and Parnate -methylene blue (injected into a vein) -pimozide -thioridazine This medicine may also interact with the following medications: -alcohol -amphetamines -aspirin and aspirin-like medicines -carbamazepine -certain medicines for depression, anxiety, or psychotic disturbances -certain medicines for migraine headaches like almotriptan, eletriptan, frovatriptan, naratriptan, rizatriptan, sumatriptan, zolmitriptan -digoxin -diuretics -fentanyl -flecainide -furazolidone -isoniazid -lithium -medicines for sleep -medicines that treat or prevent blood clots like warfarin, enoxaparin, and dalteparin -NSAIDs, medicines for pain and inflammation, like ibuprofen or naproxen -phenytoin -procarbazine -propafenone -rasagiline -ritonavir -supplements like St. John's wort, kava kava, valerian -tramadol -tryptophan -vinblastine This list may not describe all possible interactions. Give your health care provider a list of all the medicines, herbs, non-prescription drugs, or dietary supplements you use. Also tell them if you smoke, drink alcohol, or use illegal drugs. Some items may interact with your medicine. What should I watch for while using this medicine? Tell your doctor if your symptoms do not get better or if they get worse. Visit your doctor or health care professional for regular checks on your  progress. Because it may take several weeks to see the full effects of this medicine, it   is important to continue your treatment as prescribed by your doctor. Patients and their families should watch out for new or worsening thoughts of suicide or depression. Also watch out for sudden changes in feelings such as feeling anxious, agitated, panicky, irritable, hostile, aggressive, impulsive, severely restless, overly excited and hyperactive, or not being able to sleep. If this happens, especially at the beginning of treatment or after a change in dose, call your health care professional. Dennis Bast may get drowsy or dizzy. Do not drive, use machinery, or do anything that needs mental alertness until you know how this medicine affects you. Do not stand or sit up quickly, especially if you are an older patient. This reduces the risk of dizzy or fainting spells. Alcohol may interfere with the effect of this medicine. Avoid alcoholic drinks. Your mouth may get dry. Chewing sugarless gum or sucking hard candy, and drinking plenty of water may help. Contact your doctor if the problem does not go away or is severe. This medicine may affect blood sugar levels. If you have diabetes, check with your doctor or health care professional before you change your diet or the dose of your diabetic medicine. What side effects may I notice from receiving this medicine? Side effects that you should report to your doctor or health care professional as soon as possible: -allergic reactions like skin rash, itching or hives, swelling of the face, lips, or tongue -anxious -black, tarry stools -breathing problems -changes in vision -confusion -elevated mood, decreased need for sleep, racing thoughts, impulsive behavior -eye pain -fast, irregular heartbeat -feeling faint or lightheaded, falls -feeling agitated, angry, or irritable -hallucination, loss of contact with reality -loss of balance or coordination -loss of memory -painful  or prolonged erections -restlessness, pacing, inability to keep still -seizures -stiff muscles -suicidal thoughts or other mood changes -trouble sleeping -unusual bleeding or bruising -unusually weak or tired -vomiting Side effects that usually do not require medical attention (report to your doctor or health care professional if they continue or are bothersome): -change in appetite or weight -change in sex drive or performance -diarrhea -dry mouth -headache -increased sweating -nausea -tremors This list may not describe all possible side effects. Call your doctor for medical advice about side effects. You may report side effects to FDA at 1-800-FDA-1088. Where should I keep my medicine? Keep out of the reach of children. Store at room temperature between 15 and 30 degrees C (59 and 86 degrees F). Throw away any unused medicine after the expiration date. NOTE: This sheet is a summary. It may not cover all possible information. If you have questions about this medicine, talk to your doctor, pharmacist, or health care provider.  2018 Elsevier/Gold Standard (2015-09-17 15:55:27) Hydroxyzine capsules or tablets What is this medicine? HYDROXYZINE (hye Cooter i zeen) is an antihistamine. This medicine is used to treat allergy symptoms. It is also used to treat anxiety and tension. This medicine can be used with other medicines to induce sleep before surgery. This medicine may be used for other purposes; ask your health care provider or pharmacist if you have questions. COMMON BRAND NAME(S): ANX, Atarax, Rezine, Vistaril What should I tell my health care provider before I take this medicine? They need to know if you have any of these conditions: -any chronic illness -difficulty passing urine -glaucoma -heart disease -kidney disease -liver disease -lung disease -an unusual or allergic reaction to hydroxyzine, cetirizine, other medicines, foods, dyes, or preservatives -pregnant or trying  to get pregnant -breast-feeding How should I  use this medicine? Take this medicine by mouth with a full glass of water. Follow the directions on the prescription label. You may take this medicine with food or on an empty stomach. Take your medicine at regular intervals. Do not take your medicine more often than directed. Talk to your pediatrician regarding the use of this medicine in children. Special care may be needed. While this drug may be prescribed for children as young as 93 years of age for selected conditions, precautions do apply. Patients over 63 years old may have a stronger reaction and need a smaller dose. Overdosage: If you think you have taken too much of this medicine contact a poison control center or emergency room at once. NOTE: This medicine is only for you. Do not share this medicine with others. What if I miss a dose? If you miss a dose, take it as soon as you can. If it is almost time for your next dose, take only that dose. Do not take double or extra doses. What may interact with this medicine? -alcohol -barbiturate medicines for sleep or seizures -medicines for colds, allergies -medicines for depression, anxiety, or emotional disturbances -medicines for pain -medicines for sleep -muscle relaxants This list may not describe all possible interactions. Give your health care provider a list of all the medicines, herbs, non-prescription drugs, or dietary supplements you use. Also tell them if you smoke, drink alcohol, or use illegal drugs. Some items may interact with your medicine. What should I watch for while using this medicine? Tell your doctor or health care professional if your symptoms do not improve. You may get drowsy or dizzy. Do not drive, use machinery, or do anything that needs mental alertness until you know how this medicine affects you. Do not stand or sit up quickly, especially if you are an older patient. This reduces the risk of dizzy or fainting spells.  Alcohol may interfere with the effect of this medicine. Avoid alcoholic drinks. Your mouth may get dry. Chewing sugarless gum or sucking hard candy, and drinking plenty of water may help. Contact your doctor if the problem does not go away or is severe. This medicine may cause dry eyes and blurred vision. If you wear contact lenses you may feel some discomfort. Lubricating drops may help. See your eye doctor if the problem does not go away or is severe. If you are receiving skin tests for allergies, tell your doctor you are using this medicine. What side effects may I notice from receiving this medicine? Side effects that you should report to your doctor or health care professional as soon as possible: -fast or irregular heartbeat -difficulty passing urine -seizures -slurred speech or confusion -tremor Side effects that usually do not require medical attention (report to your doctor or health care professional if they continue or are bothersome): -constipation -drowsiness -fatigue -headache -stomach upset This list may not describe all possible side effects. Call your doctor for medical advice about side effects. You may report side effects to FDA at 1-800-FDA-1088. Where should I keep my medicine? Keep out of the reach of children. Store at room temperature between 15 and 30 degrees C (59 and 86 degrees F). Keep container tightly closed. Throw away any unused medicine after the expiration date. NOTE: This sheet is a summary. It may not cover all possible information. If you have questions about this medicine, talk to your doctor, pharmacist, or health care provider.  2018 Elsevier/Gold Standard (2007-08-29 14:50:59)

## 2017-05-14 NOTE — Progress Notes (Signed)
Psychiatric Initial Adult Assessment   Patient Identification: Traci Davis MRN:  [id number] Date of Evaluation:  05/14/2017 Referral Source: Threasa Alpha NP  Chief Complaint:  ' I am anxious.'  Chief Complaint    Establish Care; Anxiety; Depression; Panic Attack; Memory Loss     Visit Diagnosis:    ICD-10-CM   1. MDD (major depressive disorder), recurrent episode, mild (HCC) F33.0 DULoxetine (CYMBALTA) 60 MG capsule    FLUoxetine (PROZAC) 20 MG capsule    ARIPiprazole (ABILIFY) 5 MG tablet  2. GAD (generalized anxiety disorder) F41.1 FLUoxetine (PROZAC) 20 MG capsule    hydrOXYzine (VISTARIL) 25 MG capsule    History of Present Illness: Traci Davis is a 47 y old caucasian female ,lives in [place], is on SSD, never married , has a hx of anxiety , cerebral palsy, DM , presented to the clinic to establish care.  Traci Davis presents today as wheelchair-bound.  Patient reports she has been struggling with depression and anxiety since the past 20 years or so.  She describes her depression as feeling sad, having crying spells as well as sleep issues.  She reports her depressive symptoms are currently better on the current medications.  She reports she has been on Cymbalta 120 mg since the past several years and Abilify was added a couple of years ago.  She is currently on Abilify 5 mg daily.  She does not know how much the Abilify is working but she does feel that the Abilify may have helped to some extent.  She reports her current symptoms are more anxiety related.  She reports she was taken off of her Xanax by her other providers because she is also on pain management.  She reports she took Xanax, Klonopin and other medications for a very long time for anxiety attacks.  She describes her anxiety symptoms as generalized worry about everything to the extreme.  She calls herself a Research officer, trade [employer].  She also reports that she has these anxiety attacks when she has chest pain, shortness of breath and crying spells and  feeling nervous.  She reports her anxiety attacks are usually triggered by stress.  She has been struggling  with this since the past 20 years or so.  She reports she does not think the Cymbalta is actually helping her with her anxiety issues.  She denies any sleep issues right now.  She used to have sleep trouble and used to take Ambien in the past.  She reports she was taken off of the Ambien.  She is not concerned about her sleep right now.  She denies any bipolar symptoms.  She denies any history of trauma.  She denies any history of eating disorder.  She denies any perceptual disturbances.  She denies any suicidality or homicidality.  She used to follow up with " [facility] "in the past.  She reports they were prescribing her medications for her mental health issues.  However she stopped going there when they changed to [place].  Her primary medical doctor is currently prescribing her medications.  She also reports multiple medical problems.  She reports chronic pain, status post at least 5 knee replacement surgeries in the past, history of gallbladder removal, diabetes, as well as cerebral palsy.  She wears braces on her left lower extremities.  She reports she is wheelchair bound mostly.  She currently lives with her boyfriend in [place].  She is on SSD.  She reports her boyfriend as supportive.     Associated Signs/Symptoms: Depression Symptoms:  depressed mood, anxiety, (Hypo) Manic Symptoms:  denies Anxiety Symptoms:  Excessive Worry, Panic Symptoms, Psychotic Symptoms:  denies PTSD Symptoms: Negative  Past Psychiatric History:  Used to follow-up with [facility] in the past. Denies any inpatient mental health admissions.  She denies any suicide attempts.  Previous Psychotropic Medications: Yes xanax, klonopin  Substance Abuse History in the last 12 months:  No.  Consequences of Substance Abuse: Negative  Past Medical History:  Past Medical History:  Diagnosis Date  . Anxiety    . Arthritis   . Asthma   . Cerebral palsy ([place])   . COPD (chronic obstructive pulmonary disease) ([place])   . Depression   . Diabetes mellitus without complication ([place])   . GERD (gastroesophageal reflux disease)   . Hypertension     Past Surgical History:  Procedure Laterality Date  . CESAREAN SECTION  1995  . CHOLECYSTECTOMY    . FOOT CAPSULE RELEASE W/ PERCUTANEOUS HEEL CORD LENGTHENING, TIBIAL TENDON TRANSFER     age 26 ,and 2010-both legs  . JOINT REPLACEMENT Right    both knees partial replacements dates unknown  . knee replacment     x 5 per pt. last one- left knee 2014. has had 2 on R 3 on L    Family Psychiatric History: Mother-depression, sister-depression.  She denies any mental health admissions in her family.  She denies suicide attempts in her family.  Denies any alcoholism or other drug abuse in her family  Family History:  Family History  Problem Relation Age of Onset  . CAD Father   . Heart attack Brother   . Sexual abuse Paternal Grandfather     Social History:   Social History   Socioeconomic History  . Marital status: Single    Spouse name: None  . Number of children: None  . Years of education: None  . Highest education level: None  Social Needs  . Financial resource strain: None  . Food insecurity - worry: None  . Food insecurity - inability: None  . Transportation needs - medical: None  . Transportation needs - non-medical: None  Occupational History  . None  Tobacco Use  . Smoking status: Never Smoker  . Smokeless tobacco: Never Used  Substance and Sexual Activity  . Alcohol use: No  . Drug use: No  . Sexual activity: Yes  Other Topics Concern  . None  Social History Narrative  . None    Additional Social History:Born in [place].Mother raised her as a child . Father and mother seperated when they were little . Has three siblings . Graduated HS, went to college at [employer] for 3 yrs. Major in General office technology .Never married  . Has one daughter - 57 yrs old. She currenly lives with her boyfriend in [place].   Allergies:   Allergies  Allergen Reactions  . Cephalexin Hives  . Meloxicam Other (See Comments)    Damage kidney  . Baclofen Other (See Comments)    "makes cerebral palsy do adverse reaction on me"  . Morphine And Related Other (See Comments)    hallucinations  . Vantin  [Cefpodoxime]     Other reaction(s): Vomiting  . Ciprofloxacin Itching and Nausea And Vomiting  . Tape Rash    Other reaction(s): Other (See Comments) Uncoded Allergy. Allergen: Tape, Other Reaction: skin tears    Metabolic Disorder Labs: Lab Results  Component Value Date   HGBA1C 6.1 (H) 02/19/2015   No results found for: PROLACTIN No results found for:  CHOL, TRIG, HDL, CHOLHDL, VLDL, LDLCALC   Current Medications: Current Outpatient Medications  Medication Sig Dispense Refill  . albuterol (PROVENTIL HFA;VENTOLIN HFA) 108 (90 BASE) MCG/ACT inhaler Inhale 2 puffs into the lungs 4 (four) times daily as needed. For shortness of breath and/or wheezing    . ARIPiprazole (ABILIFY) 5 MG tablet Take 1 tablet (5 mg total) by mouth at bedtime. 30 tablet 2  . Blood Glucose Monitoring Suppl (GLUCOCOM BLOOD GLUCOSE MONITOR) DEVI Test daily before all meals/snacks and once before bedtime.    . budesonide-formoterol (SYMBICORT) 160-4.5 MCG/ACT inhaler 2 PUMP(S) INHALATION TWO TIMES A DAY    . CREON 36000 UNITS CPEP capsule Take 16,109-60,454 Units by mouth See admin instructions. Take 72000 units oral three times a day with meals, and take 36000 unit orally two times a day with snack.  5  . DULoxetine (CYMBALTA) 60 MG capsule Take 1 capsule (60 mg total) by mouth daily with supper. 30 capsule 2  . furosemide (LASIX) 40 MG tablet Take 40 mg by mouth.    Marland Kitchen glimepiride (AMARYL) 4 MG tablet Take 4 mg by mouth daily with breakfast.    . glucose blood (ONE TOUCH ULTRA TEST) test strip USE TO TEST BLOOD SUGAR TWO TIMES A DAY    .  incobotulinumtoxinA (XEOMIN) 100 units SOLR injection Inject into the muscle.    . Influenza Vac Typ A&B Surf Ant (FLUVIRIN) 0.5 ML SUSY Inject into the muscle.    . Lancets (FREESTYLE) lancets Test daily before all meals/snacks    . lisinopril (PRINIVIL,ZESTRIL) 20 MG tablet Take 20 mg by mouth daily.    . metFORMIN (GLUCOPHAGE) 500 MG tablet Take by mouth.    . mometasone-formoterol (DULERA) 200-5 MCG/ACT AERO Frequency:PRN   Dosage:0.0     Instructions:  Note:Dose: 200-5 MCG    . nystatin (MYCOSTATIN/NYSTOP) powder 1 application daily.    Marland Kitchen omeprazole (PRILOSEC) 40 MG capsule Take 40 mg by mouth 2 (two) times daily.    . pioglitazone (ACTOS) 45 MG tablet Take 45 mg by mouth daily.    . polyethylene glycol (MIRALAX / GLYCOLAX) packet Use 17 gram or 1 capful in 8 oz of fluid of your choice three times a day until having soft stools, then back down to once a day.    . potassium chloride SA (K-DUR,KLOR-CON) 20 MEQ tablet Take 20 mEq by mouth 2 (two) times daily.    . pregabalin (LYRICA) 50 MG capsule TAKE (2) CAPSULES TWICE DAILY.    Marland Kitchen promethazine (PHENERGAN) 25 MG tablet Take 25 mg by mouth 3 (three) times daily as needed for nausea or vomiting.     . solifenacin (VESICARE) 5 MG tablet Take 5 mg by mouth daily.    Marland Kitchen tiotropium (SPIRIVA HANDIHALER) 18 MCG inhalation capsule Place into inhaler and inhale.    . topiramate (TOPAMAX) 100 MG tablet Take 100 mg by mouth daily.    Marland Kitchen FLUoxetine (PROZAC) 20 MG capsule Take 1 capsule (20 mg total) by mouth daily with breakfast. 30 capsule 1  . hydrOXYzine (VISTARIL) 25 MG capsule Take 1 capsule (25 mg total) by mouth 3 (three) times daily as needed for anxiety. Only for severe anxiety attacks 90 capsule 1   No current facility-administered medications for this visit.     Neurologic: Headache: No Seizure: No Paresthesias:No  Musculoskeletal: Strength & Muscle Tone: within normal limits Gait & Station: wheel chair bound Patient leans:  N/A  Psychiatric Specialty Exam: Review of Systems  Musculoskeletal: Positive for  back pain, joint pain and myalgias.  Psychiatric/Behavioral: Positive for depression. The patient is nervous/anxious.   All other systems reviewed and are negative.   Blood pressure 107/72, pulse 78, height 5\' 2"  (1.575 m), last menstrual period 08/20/2014.Body mass index is 40.79 kg/m.  General Appearance: Casual  Eye Contact:  Fair  Speech:  Clear and Coherent  Volume:  Normal  Mood:  Anxious and Dysphoric  Affect:  Tearful  Thought Process:  Goal Directed and Descriptions of Associations: Intact  Orientation:  Full (Time, Place, and Person)  Thought Content:  Logical  Suicidal Thoughts:  No  Homicidal Thoughts:  No  Memory:  Immediate;   Fair Recent;   Fair Remote;   Fair  Judgement:  Fair  Insight:  Fair  Psychomotor Activity:  Decreased  Concentration:  Concentration: Fair and Attention Span: Fair  Recall:  [employer] of Knowledge:Fair  Language: Fair  Akathisia:  No  Handed:  Right  AIMS (if indicated):  0  Assets:  Communication Skills Desire for Improvement Housing Intimacy Social Support Talents/Skills  ADL's:  Intact  Cognition: WNL  Sleep:  fair    Treatment Plan Summary: Sharlett is a 47 year old Caucasian female who has a history of depression, anxiety, chronic pain, diabetes, cerebral palsy, presented to the clinic today to establish care.  Davy Pique currently reports anxiety attacks usually triggered by stress.  She has been on the Cymbalta since the past more than 10 years.  She reports she also is currently on Abilify which she has been taking for the past 2 years or so.  She was recently taken off of her benzodiazepines which triggered anxiety symptoms to get worse.  She is also getting pain management and is on narcotic pain medications and that is why she was taken off of her benzodiazepines.  She denies any suicidality.  She is biologically predisposed given her multiple  medical issues as well as her family history.  She does have social support and is currently on disability.  She is motivated to start treatment as well as pursue psychotherapy.  Plan as noted below. Medication management and Plan as noted below  Plan  For depression Reduce Cymbalta to 60 mg p.o. Daily, she was taking 120 mg p.o. daily. Continue Abilify 5 mg p.o. nightly Start Prozac 20 mg p.o. daily with breakfast.  For generalized anxiety disorder Reduce Cymbalta to 60 mg p.o. daily Start Prozac 20 mg p.o. daily with breakfast Add hydroxyzine 25 mg p.o. 3 times daily as needed for anxiety attacks Refer for CBT  Provided medication education, provided handouts.  Reviewed medical records in EHR.  Will order the following labs TSH, lipid panel, hemoglobin A1c, vitamin B12, folate, vitamin D, prolactin.  EKG reviewed-04/09/2017-QTC within normal limit.  Refer for CBT with Ms. Royal Piedra.  Follow-up in clinic in 5 weeks or sooner if needed.  This note was generated in part or whole with voice recognition software. Voice recognition is usually quite accurate but there are transcription errors that can and very often do occur. I apologize for any typographical errors that were not detected and corrected.   More than 50 % of the time was spent for psychoeducation and supportive psychotherapy and care coordination.   Ursula Alert, MD 1/15/201912:33 PM

## 2017-05-16 ENCOUNTER — Encounter: Payer: Medicare Other | Admitting: Physical Therapy

## 2017-05-19 DIAGNOSIS — R079 Chest pain, unspecified: Secondary | ICD-10-CM | POA: Insufficient documentation

## 2017-05-19 NOTE — Progress Notes (Deleted)
Cardiology Office Note  Date:  05/19/2017   ID:  Traci Davis, DOB 1971/03/14, MRN [id number]  PCP:  Remi Haggard, FNP   No chief complaint on file.   HPI:  Traci Davis is a 47 y.o. female a history of  COPD,  diabetes   hypertension  cerebral palsy  presenting to the emergency department today with shortness of breath, chest pain.    feeling short of breath over the past 3 days.    "wheezy."   She is denying any fever, chills, cough or runny nose.  No known sick contacts.  breathing treatment at 5 PM today when she began to develop left-sided chest pain.     cramping pain that was an 8 out of 10 that was nonradiating.   No associated nausea, vomiting or diaphoresis.  after taking the breathing treatment she no longer has the wheezing feeling but the chest pain has stayed.   reduced over the past several hours Potassium .3.0  anxious because she is not allowed to take her benzodiazepines anymore.  in chronic pain management and had a choice either between her anxiety meds or her pain meds.   She chose the pain meds and is therefore not been on her anxiety meds in about a year and a half.     anxious and with panic at different situations.   She said that she has had chest pain before with panic attacks.  refeived ativan Cardiac w/u neg   PMH:   has a past medical history of Anxiety, Arthritis, Asthma, Cerebral palsy ([place]), COPD (chronic obstructive pulmonary disease) ([place]), Depression, Diabetes mellitus without complication ([place]), GERD (gastroesophageal reflux disease), and Hypertension.  PSH:    Past Surgical History:  Procedure Laterality Date  . CESAREAN SECTION  1995  . CHOLECYSTECTOMY    . FOOT CAPSULE RELEASE W/ PERCUTANEOUS HEEL CORD LENGTHENING, TIBIAL TENDON TRANSFER     age 69 ,and 2010-both legs  . JOINT REPLACEMENT Right    both knees partial replacements dates unknown  . knee replacment     x 5 per pt. last one- left knee 2014. has had 2 on R 3  on L    Current Outpatient Medications  Medication Sig Dispense Refill  . albuterol (PROVENTIL HFA;VENTOLIN HFA) 108 (90 BASE) MCG/ACT inhaler Inhale 2 puffs into the lungs 4 (four) times daily as needed. For shortness of breath and/or wheezing    . ARIPiprazole (ABILIFY) 5 MG tablet Take 1 tablet (5 mg total) by mouth at bedtime. 30 tablet 2  . Blood Glucose Monitoring Suppl (GLUCOCOM BLOOD GLUCOSE MONITOR) DEVI Test daily before all meals/snacks and once before bedtime.    . budesonide-formoterol (SYMBICORT) 160-4.5 MCG/ACT inhaler 2 PUMP(S) INHALATION TWO TIMES A DAY    . CREON 36000 UNITS CPEP capsule Take 63,016-01,093 Units by mouth See admin instructions. Take 72000 units oral three times a day with meals, and take 36000 unit orally two times a day with snack.  5  . DULoxetine (CYMBALTA) 60 MG capsule Take 1 capsule (60 mg total) by mouth daily with supper. 30 capsule 2  . FLUoxetine (PROZAC) 20 MG capsule Take 1 capsule (20 mg total) by mouth daily with breakfast. 30 capsule 1  . furosemide (LASIX) 40 MG tablet Take 40 mg by mouth.    Marland Kitchen glimepiride (AMARYL) 4 MG tablet Take 4 mg by mouth daily with breakfast.    . glucose blood (ONE TOUCH ULTRA TEST) test strip USE TO TEST BLOOD  SUGAR TWO TIMES A DAY    . hydrOXYzine (VISTARIL) 25 MG capsule Take 1 capsule (25 mg total) by mouth 3 (three) times daily as needed for anxiety. Only for severe anxiety attacks 90 capsule 1  . incobotulinumtoxinA (XEOMIN) 100 units SOLR injection Inject into the muscle.    . Influenza Vac Typ A&B Surf Ant (FLUVIRIN) 0.5 ML SUSY Inject into the muscle.    . Lancets (FREESTYLE) lancets Test daily before all meals/snacks    . lisinopril (PRINIVIL,ZESTRIL) 20 MG tablet Take 20 mg by mouth daily.    . metFORMIN (GLUCOPHAGE) 500 MG tablet Take by mouth.    . mometasone-formoterol (DULERA) 200-5 MCG/ACT AERO Frequency:PRN   Dosage:0.0     Instructions:  Note:Dose: 200-5 MCG    . nystatin (MYCOSTATIN/NYSTOP) powder 1  application daily.    Marland Kitchen omeprazole (PRILOSEC) 40 MG capsule Take 40 mg by mouth 2 (two) times daily.    . pioglitazone (ACTOS) 45 MG tablet Take 45 mg by mouth daily.    . polyethylene glycol (MIRALAX / GLYCOLAX) packet Use 17 gram or 1 capful in 8 oz of fluid of your choice three times a day until having soft stools, then back down to once a day.    . potassium chloride SA (K-DUR,KLOR-CON) 20 MEQ tablet Take 20 mEq by mouth 2 (two) times daily.    . pregabalin (LYRICA) 50 MG capsule TAKE (2) CAPSULES TWICE DAILY.    Marland Kitchen promethazine (PHENERGAN) 25 MG tablet Take 25 mg by mouth 3 (three) times daily as needed for nausea or vomiting.     . solifenacin (VESICARE) 5 MG tablet Take 5 mg by mouth daily.    Marland Kitchen tiotropium (SPIRIVA HANDIHALER) 18 MCG inhalation capsule Place into inhaler and inhale.    . topiramate (TOPAMAX) 100 MG tablet Take 100 mg by mouth daily.     No current facility-administered medications for this visit.      Allergies:   Cephalexin; Meloxicam; Baclofen; Morphine and related; Vantin  [cefpodoxime]; Ciprofloxacin; and Tape   Social History:  The patient  reports that  has never smoked. she has never used smokeless tobacco. She reports that she does not drink alcohol or use drugs.   Family History:   family history includes CAD in her father; Heart attack in her brother; Sexual abuse in her paternal grandfather.    Review of Systems: ROS   PHYSICAL EXAM: VS:  LMP 08/20/2014 Comment: post menopausal , BMI There is no height or weight on file to calculate BMI. GEN: Well nourished, well developed, in no acute distress  HEENT: normal  Neck: no JVD, carotid bruits, or masses Cardiac: RRR; no murmurs, rubs, or gallops,no edema  Respiratory:  clear to auscultation bilaterally, normal work of breathing GI: soft, nontender, nondistended, + BS MS: no deformity or atrophy  Skin: warm and dry, no rash Neuro:  Strength and sensation are intact Psych: euthymic mood, full  affect    Recent Labs: 07/10/2016: TSH 2.198 04/08/2017: BUN 14; Creatinine, Ser 0.93; Hemoglobin 15.2; Platelets 206; Potassium 3.0; Sodium 139    Lipid Panel No results found for: CHOL, HDL, LDLCALC, TRIG    Wt Readings from Last 3 Encounters:  04/08/17 223 lb (101.2 kg)  08/24/16 200 lb (90.7 kg)  07/31/16 200 lb 8 oz (90.9 kg)       ASSESSMENT AND PLAN:  No diagnosis found.   Disposition:   F/U  6 months  No orders of the defined types were placed in  this encounter.    Signed, Esmond Plants, M.D., Ph.D. 05/19/2017  [place], [place]

## 2017-05-20 ENCOUNTER — Encounter: Payer: Medicare Other | Admitting: Physical Therapy

## 2017-05-21 ENCOUNTER — Ambulatory Visit: Payer: Medicare Other | Admitting: Cardiovascular Disease

## 2017-05-23 ENCOUNTER — Ambulatory Visit: Payer: Medicare Other | Admitting: Physical Therapy

## 2017-05-23 ENCOUNTER — Encounter: Payer: Self-pay | Admitting: Cardiovascular Disease

## 2017-05-27 ENCOUNTER — Encounter: Payer: Medicare Other | Admitting: Physical Therapy

## 2017-05-30 ENCOUNTER — Ambulatory Visit: Payer: Medicare Other | Admitting: Physical Therapy

## 2017-06-03 ENCOUNTER — Ambulatory Visit: Payer: Medicare Other | Admitting: Physical Therapy

## 2017-06-05 ENCOUNTER — Ambulatory Visit: Payer: Medicare Other | Admitting: Licensed Clinical Social Worker

## 2017-06-11 ENCOUNTER — Other Ambulatory Visit: Payer: Self-pay | Admitting: Psychiatry

## 2017-06-11 NOTE — Telephone Encounter (Signed)
Alkaline phosphatase-150 high, ALT-54 high Lipid panel-within normal limits Hemoglobin A1c-5.7 high Vitamin D-30.9-within normal limits CBC with differential- within normal limits except for RDW-15.9, monocytes-2.8 Vitamin B12 206 Folate 6.2

## 2017-06-12 ENCOUNTER — Encounter: Payer: Self-pay | Admitting: Physical Therapy

## 2017-06-12 ENCOUNTER — Ambulatory Visit: Payer: Medicare Other | Attending: Sports Medicine | Admitting: Physical Therapy

## 2017-06-12 DIAGNOSIS — M25511 Pain in right shoulder: Secondary | ICD-10-CM

## 2017-06-12 DIAGNOSIS — M25512 Pain in left shoulder: Secondary | ICD-10-CM | POA: Diagnosis present

## 2017-06-12 DIAGNOSIS — M62838 Other muscle spasm: Secondary | ICD-10-CM | POA: Diagnosis present

## 2017-06-12 DIAGNOSIS — M6281 Muscle weakness (generalized): Secondary | ICD-10-CM | POA: Diagnosis present

## 2017-06-13 ENCOUNTER — Ambulatory Visit: Payer: Medicare Other | Admitting: Psychiatry

## 2017-06-13 NOTE — Therapy (Signed)
[facility] [address], [place], [address] Phone: [phone]   Fax:  [phone]  Physical Therapy Treatment  Patient Details  Name: Traci Davis MRN: [id number] Date of Birth: 1970/05/14 Referring Provider: Rosalia Hammers DO   Encounter Date: 06/12/2017  PT End of Session - 06/12/17 1459    Visit Number  5    Number of Visits  12    Date for PT Re-Evaluation  07/24/17    Authorization Type    Authorization Time Period    PT Start Time  1448    PT Stop Time  1527    PT Time Calculation (min)  39 min    Activity Tolerance  Patient tolerated treatment well;Patient limited by pain    Behavior During Therapy  [facility] for tasks assessed/performed       Past Medical History:  Diagnosis Date  . Anxiety   . Arthritis   . Asthma   . Cerebral palsy ([place])   . COPD (chronic obstructive pulmonary disease) ([place])   . Depression   . Diabetes mellitus without complication ([place])   . GERD (gastroesophageal reflux disease)   . Hypertension     Past Surgical History:  Procedure Laterality Date  . CESAREAN SECTION  1995  . CHOLECYSTECTOMY    . FOOT CAPSULE RELEASE W/ PERCUTANEOUS HEEL CORD LENGTHENING, TIBIAL TENDON TRANSFER     age 47 ,and 2010-both legs  . JOINT REPLACEMENT Right    both knees partial replacements dates unknown  . knee replacment     x 5 per pt. last one- left knee 2014. has had 2 on R 3 on L    There were no vitals filed for this visit.  Subjective Assessment - 06/12/17 1456    Subjective  Patient is returning following extended absence due to car trouble and is having pain in left shoulder as well with raising arm out to side. right UE continues with intermittent pain and she feels stress is contributing to her symptoms. She is going to contact MD regarding continued pain and would like to continue physical therapy for pain and strengthening.     Pertinent History  Patient reports she  began having right shoulder pain about 2 months ago when she had to use a manual chair because her electric chair broke. She had to reach back to shut a door and her right shoulder pain began.  Her pain has gotten progressivly worse since then and is now radiating into her forearm.     Limitations  Lifting;House hold activities;Other (comment) sleeping    Diagnostic tests  X rays;     Patient Stated Goals  to get rid of pain and be able to use right arm again without difficulty    Currently in Pain?  Yes    Pain Score  7     Pain Location  Shoulder    Pain Orientation  Right;Left    Pain Descriptors / Indicators  Aching    Pain Type  Acute pain    Pain Onset  More than a month ago October 2018    Pain Frequency  Intermittent         [facility] PT Assessment - 06/13/17 0001      Assessment   Medical Diagnosis  Acute right shoulder pain    Referring Provider  Rosalia Hammers DO    Onset Date/Surgical Date  12/29/16    Hand Dominance  Right  Observation/Other Assessments   Quick DASH   40%         Objective:  Palpation: moderate tenderness bilateral shoulders anterior aspect/ moderate spasms bilateral upper trapezius muscles Posture: forward rounded shoulders with patient seated  AROM: right shoulder forward elevation 130 degrees with mild pain end range; left shoulder forward elevation to 90 degrees with increased pain Special tests: Neer impingement mildly positive bilateral shoulders; empty can negative bilaterally   Treatment:  Manual therapy: 13 min. Goal: spasms, pain  STM performed to right shoulder anterior aspect along biceps tendon, right upper trapezius and along cervical spine paraspinal muscles with superficial techniques with patient seated in chair with right UE supported on pillow  Modalities: Electrical stimulation: 15 min; Russian stim. 10/10 cycle applied (2) electrodes toright shoulder periscapular muscles: rhomboids, lower trapezius with  patientseatedwithright UE supported on pillow; goal muscle re education High volt estim.clincial program for muscle spasms (2) electrodes applied to right upper arm, anterior shoulder,intensity to tolerance with patient in seatedposition with right UE supported on pillow with moist heat applied to same goal: pain, spasms  Therapeutic exercise: patient performed with demonstration, verbal and tactile cues of therapist: goal: improve function, Quick Dash, pain control  Sitting: Scapular retraction with tactile cues x 10 Forward elevation AAROM with clasped hands to forehead x 10 Instructed in home exercise and posture awareness to continue at home   Patient response to treatment: Patient with tenderness bilateral shoulders anteriorly with STM. Improved scapular retraction with repetition and moderate cuing. verbalized understanding of posture, exercises for home. Pain level decreased 50% following treatment .     PT Education - 06/12/17 1520    Education provided  Yes    Education Details  re assessed home exercises    Person(s) Educated  Patient    Methods  Explanation    Comprehension  Verbalized understanding          PT Long Term Goals - 06/12/17 1652      PT LONG TERM GOAL #1   Title  Patient will report pain level max of 3/10 right shoulder with aggravating positions of reaching behind and lifting     Baseline  pain level ranges up to 8/10 with aggravating positions; 05/06/17 pain continues to range up to 8-10/10 with aggravating activties    Status  Revised    Target Date  07/10/17      PT LONG TERM GOAL #2   Title  Patient will report pain level max of < 3/10 right shoulder with aggravating positions of reaching behind and lifting     Baseline  pain level ranges up to 8/10 with aggravating positions    Status  Revised    Target Date  07/24/17      PT LONG TERM GOAL #3   Title  improved Quick Dash to 30% or less indicating improved function with right UE with less  difficulty and pain    Baseline  Quick Dash 60%; 05/06/17 45%; 06/12/17    Status  Revised    Target Date  07/10/17      PT LONG TERM GOAL #4   Title  improved Quick Dash to 20% or less indicating improved function with right UE with less difficulty and pain    Baseline  QuickDash 60%    Status  Revised    Target Date  07/24/17      PT LONG TERM GOAL #5   Title  Patient will be independent with home program for pain control,  self managment of symptoms, exercises to allow transition to home program when discharged from physical therapy    Baseline  Patient is limited in knowledge of appropriate pain contro and exercises, precautions    Status  Revised            Plan - 06/12/17 1648    Clinical Impression Statement  Patient is making steady progress with decreaseding sympotms in right shoulder and now having increased left shoulder pain. Both shoulder are aggravated by increased activity/use. She has responded well to physical therapy intervention and is returning to therapy  following an extended absence due to not having a car. She continues with 40% impairment based on Quick Dash, patient interview and pain level. She will benefit from continued physical therapy intervention to address limitaions in order ot transition to indepenenthome program and slef management.     Rehab Potential  Good    Clinical Impairments Affecting Rehab Potential  (+) acute condition, motivated, age(-)multiple co morbidities    PT Frequency  1x / week    PT Duration  6 weeks    PT Treatment/Interventions  Cryotherapy;Electrical Stimulation;Ultrasound;Moist Heat;Therapeutic exercise;Patient/family education;Neuromuscular re-education;Manual techniques    PT Next Visit Plan  pain control, manual therapy STM, exercise instruction, progression    PT Home Exercise Plan  pain control with heat/ice, ROM exercises, scapular retraction, positions to avoid pain    Consulted and Agree with Plan of Care  Patient        Patient will benefit from skilled therapeutic intervention in order to improve the following deficits and impairments:  Decreased knowledge of precautions, Impaired UE functional use, Impaired perceived functional ability, Decreased strength, Decreased range of motion, Increased muscle spasms, Pain  Visit Diagnosis: Acute pain of right shoulder - Plan: PT plan of care cert/re-cert  Muscle weakness (generalized) - Plan: PT plan of care cert/re-cert  Other muscle spasm - Plan: PT plan of care cert/re-cert  Acute pain of left shoulder - Plan: PT plan of care cert/re-cert     Problem List Patient Active Problem List   Diagnosis Date Noted  . Chest pain with moderate risk for cardiac etiology 05/19/2017  . History of depression 04/15/2017  . Generalized anxiety disorder 04/15/2017  . Chronic daily headache 04/15/2017  . Panic attack 04/15/2017  . Nocturnal enuresis 04/15/2017  . Mild cognitive impairment 04/15/2017  . Loss of memory 01/14/2017  . Pain medication agreement signed 01/08/2017  . Headache disorder 12/04/2016  . Chronic, continuous use of opioids 07/01/2015  . Vertigo 07/01/2015  . Chronic midline low back pain without sciatica 03/21/2015  . Acute renal failure (ARF) ([place]) 02/19/2015  . Type 2 diabetes mellitus without complication ([place]) 59/56/3875  . Acute pancreatitis 09/04/2014  . Cerebral palsy ([place]) 01/05/2014  . 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 Hip pain, chronic 04/28/2013  . Essential hypertension 01/10/2013  . Irritable colon 01/10/2013  . Noninfectious gastroenteritis and colitis 01/10/2013  . Encounter for long-term (current) use of other medications 12/04/2012  . Esophageal reflux 08/12/2012  . Epigastric pain 08/12/2012  . Diarrhea 08/12/2012  . Primary localized osteoarthrosis, lower leg 05/07/2012  . Difficulty walking 02/06/2012    Jomarie Longs PT 06/13/2017, 5:50 PM  [facility] [address] [place], [address] Phone: [phone]   Fax:  [phone]  Name: Traci Davis MRN: [id number] Date of Birth: 07/25/1970

## 2017-06-17 ENCOUNTER — Encounter: Payer: Self-pay | Admitting: Psychiatry

## 2017-06-17 ENCOUNTER — Ambulatory Visit (INDEPENDENT_AMBULATORY_CARE_PROVIDER_SITE_OTHER): Payer: Medicare Other | Admitting: Psychiatry

## 2017-06-17 ENCOUNTER — Other Ambulatory Visit: Payer: Self-pay

## 2017-06-17 VITALS — BP 117/77 | HR 72 | Temp 97.6°F

## 2017-06-17 DIAGNOSIS — F330 Major depressive disorder, recurrent, mild: Secondary | ICD-10-CM

## 2017-06-17 DIAGNOSIS — F411 Generalized anxiety disorder: Secondary | ICD-10-CM | POA: Diagnosis not present

## 2017-06-17 MED ORDER — DULOXETINE HCL 20 MG PO CPEP: 40.0000 mg | ORAL_CAPSULE | Freq: Every day | ORAL | 2 refills | Status: DC

## 2017-06-17 MED ORDER — FLUOXETINE HCL 40 MG PO CAPS: 40.0000 mg | ORAL_CAPSULE | Freq: Every day | ORAL | 1 refills | Status: DC

## 2017-06-17 MED ORDER — HYDROXYZINE PAMOATE 25 MG PO CAPS: 25.0000 mg | ORAL_CAPSULE | Freq: Three times a day (TID) | ORAL | 1 refills | Status: DC | PRN

## 2017-06-17 NOTE — Progress Notes (Signed)
[facility] MD OP Progress Note  06/17/2017 2:53 PM TIAH HECKEL  MRN:  [id number]  Chief Complaint: ' I am still having panic attacks.'  Chief Complaint    Follow-up; Medication Refill     HPI: Traci Davis is a 47 year old Caucasian female, lives in [place], is on SSD, never married, has a history of anxiety, cerebral palsy, diabetes mellitus presented to the clinic today for a follow-up visit.  She is wheelchair-bound.   Aerianna reported she was brought in today by her boyfriend who will be here to pick her up after the appointment.  Maelle reports she is tolerating the Prozac well.  During her last visit her Cymbalta was reduced from 120-60 mg since it was ineffective.  Kyeshia reports no side effects to the Prozac at this time.  Reports continued panic attacks and reports that she has been taking hydroxyzine around-the-clock to help her with the same.  She reports she really likes the effect of Prozac.  Discussed with her that if she continues to have panic attacks then her Prozac can be readjusted.  She agrees with plan.  She continues to tolerate the Abilify.  She denies any side effects to the same.  She reports sleep is okay.  She denies any suicidal thoughts.  She denies any perceptual disturbances.  She reports she is distressed by her boyfriend's behavior.  She reports her boyfriend deals with his own mental health issues and has been noncompliant with his medications since July 2018 or so.  She reports she was about to call 911 today but decided against it.  She reports her boyfriend has an upcoming appointment with his psychiatrist on Wednesday.  She became tearful when she discussed her boyfriend's behavior.  Discussed crisis plan with patient ,discussed with her that if her boyfriend seems to be in immediate danger to herself or others or himself then to call 911 or take him to the nearest emergency department.  Provided her with crisis hotline numbers.  She reports she was unable to make the  appointment with Ms. Peacock , had to cancel it last time .  She reports she has rescheduled her appointment with Ms. Peacock for March 2019.   Visit Diagnosis:    ICD-10-CM   1. MDD (major depressive disorder), recurrent episode, mild (HCC) F33.0 DULoxetine (CYMBALTA) 20 MG capsule    FLUoxetine (PROZAC) 40 MG capsule  2. GAD (generalized anxiety disorder) F41.1 FLUoxetine (PROZAC) 40 MG capsule    hydrOXYzine (VISTARIL) 25 MG capsule    Past Psychiatric History: [facility] with [facility].  Denies any inpatient mental health admissions.  She denies any suicide attempts.  Hx of being on medications like Xanax, Klonopin in the past.   Past Medical History:  Past Medical History:  Diagnosis Date  . Anxiety   . Arthritis   . Asthma   . Cerebral palsy ([place])   . COPD (chronic obstructive pulmonary disease) ([place])   . Depression   . Diabetes mellitus without complication ([place])   . GERD (gastroesophageal reflux disease)   . Hypertension     Past Surgical History:  Procedure Laterality Date  . CESAREAN SECTION  1995  . CHOLECYSTECTOMY    . FOOT CAPSULE RELEASE W/ PERCUTANEOUS HEEL CORD LENGTHENING, TIBIAL TENDON TRANSFER     age 67 ,and 2010-both legs  . JOINT REPLACEMENT Right    both knees partial replacements dates unknown  . knee replacment     x 5 per pt. last one-  left knee 2014. has had 2 on R 3 on L    Family Psychiatric History: Mother-depression, sister-depression.  She denies any mental health admissions in her family.  She denies suicide attempts in her family.  Denies any alcoholism or other drug abuse in her family.  Family History:  Family History  Problem Relation Age of Onset  . CAD Father   . Heart attack Brother   . Sexual abuse Paternal Grandfather    Substance abuse history: Denies   Social History:Born in [place].  Mother raised her as a child.  Father and mother separated when she was little.  Has 3 siblings.  Graduated high  school, went to college at [employer] for 3 years.  Major in general office technology.  Never married.  Has one  daughter-76 years old.  She currently lives with her boyfriend in [place]. Social History   Socioeconomic History  . Marital status: Single    Spouse name: None  . Number of children: None  . Years of education: None  . Highest education level: None  Social Needs  . Financial resource strain: None  . Food insecurity - worry: None  . Food insecurity - inability: None  . Transportation needs - medical: None  . Transportation needs - non-medical: None  Occupational History  . None  Tobacco Use  . Smoking status: Never Smoker  . Smokeless tobacco: Never Used  Substance and Sexual Activity  . Alcohol use: No  . Drug use: No  . Sexual activity: Yes  Other Topics Concern  . None  Social History Narrative  . None    Allergies:  Allergies  Allergen Reactions  . Cephalexin Hives  . Meloxicam Other (See Comments)    Damage kidney  . Baclofen Other (See Comments)    "makes cerebral palsy do adverse reaction on me"  . Morphine And Related Other (See Comments)    hallucinations  . Vantin  [Cefpodoxime]     Other reaction(s): Vomiting  . Ciprofloxacin Itching and Nausea And Vomiting  . Tape Rash    Other reaction(s): Other (See Comments) Uncoded Allergy. Allergen: Tape, Other Reaction: skin tears    Metabolic Disorder Labs: Lab Results  Component Value Date   HGBA1C 6.1 (H) 02/19/2015   No results found for: PROLACTIN No results found for: CHOL, TRIG, HDL, CHOLHDL, VLDL, LDLCALC Lab Results  Component Value Date   TSH 2.198 07/10/2016   TSH 2.891 02/19/2015    Therapeutic Level Labs: No results found for: LITHIUM No results found for: VALPROATE No components found for:  CBMZ  Current Medications: Current Outpatient Medications  Medication Sig Dispense Refill  . albuterol (PROVENTIL HFA;VENTOLIN HFA) 108 (90 BASE) MCG/ACT inhaler Inhale 2 puffs into the lungs  4 (four) times daily as needed. For shortness of breath and/or wheezing    . ARIPiprazole (ABILIFY) 5 MG tablet Take 1 tablet (5 mg total) by mouth at bedtime. 30 tablet 2  . Blood Glucose Monitoring Suppl (GLUCOCOM BLOOD GLUCOSE MONITOR) DEVI Test daily before all meals/snacks and once before bedtime.    . budesonide-formoterol (SYMBICORT) 160-4.5 MCG/ACT inhaler 2 PUMP(S) INHALATION TWO TIMES A DAY    . CREON 36000 UNITS CPEP capsule Take 17,[phone] Units by mouth See admin instructions. Take 72000 units oral three times a day with meals, and take 36000 unit orally two times a day with snack.  5  . donepezil (ARICEPT) 10 MG tablet Take by mouth.    . furosemide (LASIX) 40 MG tablet  Take 40 mg by mouth.    Marland Kitchen glimepiride (AMARYL) 4 MG tablet Take 4 mg by mouth daily with breakfast.    . glucose blood (ONE TOUCH ULTRA TEST) test strip USE TO TEST BLOOD SUGAR TWO TIMES A DAY    . hydrOXYzine (VISTARIL) 25 MG capsule Take 1 capsule (25 mg total) by mouth 3 (three) times daily as needed for anxiety. Only for severe anxiety attacks 90 capsule 1  . incobotulinumtoxinA (XEOMIN) 100 units SOLR injection Inject into the muscle.    . Influenza Vac Typ A&B Surf Ant (FLUVIRIN) 0.5 ML SUSY Inject into the muscle.    . Lancets (FREESTYLE) lancets Test daily before all meals/snacks    . lisinopril (PRINIVIL,ZESTRIL) 20 MG tablet Take 20 mg by mouth daily.    . metFORMIN (GLUCOPHAGE) 500 MG tablet Take by mouth.    . mometasone-formoterol (DULERA) 200-5 MCG/ACT AERO Frequency:PRN   Dosage:0.0     Instructions:  Note:Dose: 200-5 MCG    . nystatin (MYCOSTATIN/NYSTOP) powder 1 application daily.    Marland Kitchen omeprazole (PRILOSEC) 40 MG capsule Take 40 mg by mouth 2 (two) times daily.    . pioglitazone (ACTOS) 45 MG tablet Take 45 mg by mouth daily.    . polyethylene glycol (MIRALAX / GLYCOLAX) packet Use 17 gram or 1 capful in 8 oz of fluid of your choice three times a day until having soft stools, then back down to once  a day.    . potassium chloride SA (K-DUR,KLOR-CON) 20 MEQ tablet Take 20 mEq by mouth 2 (two) times daily.    . pregabalin (LYRICA) 50 MG capsule TAKE (2) CAPSULES TWICE DAILY.    Marland Kitchen promethazine (PHENERGAN) 25 MG tablet Take 25 mg by mouth 3 (three) times daily as needed for nausea or vomiting.     . solifenacin (VESICARE) 5 MG tablet Take 5 mg by mouth daily.    Marland Kitchen tiotropium (SPIRIVA HANDIHALER) 18 MCG inhalation capsule Place into inhaler and inhale.    . topiramate (TOPAMAX) 100 MG tablet Take 100 mg by mouth daily.    . DULoxetine (CYMBALTA) 20 MG capsule Take 2 capsules (40 mg total) by mouth daily. 60 capsule 2  . FLUoxetine (PROZAC) 40 MG capsule Take 1 capsule (40 mg total) by mouth daily. 30 capsule 1   No current facility-administered medications for this visit.      Musculoskeletal: Strength & Muscle Tone: within normal limits Gait & Station: wheelchair bound Patient leans: N/A  Psychiatric Specialty Exam: Review of Systems  Psychiatric/Behavioral: Positive for depression. The patient is nervous/anxious.   All other systems reviewed and are negative.   Blood pressure 117/77, pulse 72, temperature 97.6 F (36.4 C), temperature source Oral, last menstrual period 08/20/2014.There is no height or weight on file to calculate BMI.  General Appearance: Casual  Eye Contact:  Fair  Speech:  Clear and Coherent  Volume:  Normal  Mood:  Anxious and Dysphoric  Affect:  Tearful  Thought Process:  Goal Directed and Descriptions of Associations: Intact  Orientation:  Full (Time, Place, and Person)  Thought Content: Logical   Suicidal Thoughts:  No  Homicidal Thoughts:  No  Memory:  Immediate;   Fair Recent;   Fair Remote;   Fair  Judgement:  Fair  Insight:  Fair  Psychomotor Activity:  Normal  Concentration:  Concentration: Fair and Attention Span: Fair  Recall:  [employer] of Knowledge: Fair  Language: Fair  Akathisia:  No  Handed:  Right  AIMS (if indicated): 0  Assets:   Communication Skills Desire for Improvement Housing Social Support  ADL's:  Intact  Cognition: WNL  Sleep:  Fair   Screenings:AIMS   Assessment and Plan: Sherre is a caucasian female who has a history of depression, anxiety, chronic pain, diabetes, cerebral palsy, presented to the clinic today for a follow-up visit.  This is her second visit with Probation [facility].  During her last visit she was started on Prozac and Cymbalta was reduced to address her increased anxiety symptoms, likely triggered by her being taken off her benzodiazepines.  She is currently also getting pain management and is on narcotic pain medications.  She also is being prescribed Donpezil by her neurologist for some word winding difficulty.  She denies any memory issues at this time.  She does have psychosocial stressors of her multiple medical problems, physical limitations, reduced quality of life, chronic pain as well as relationship struggles with her boyfriend who deals with his own mental health issues.  Discussed medication changes with patient.  Plan as noted below.  Plan Depression Reduce Cymbalta to 40 mg p.o. daily.  Patient was on 120 mg when she came to the clinic in January 2019.  She had reported it as ineffective. Increase Prozac to 40 mg p.o. daily with breakfast. Continue Abilify 5 mg p.o. nightly [place] equals 0.  For GAD Reduce Cymbalta to 40 mg p.o. daily Increase Prozac to 40 mg p.o. daily with breakfast Continue hydroxyzine 25 mg p.o. 3 times daily as needed for anxiety attacks Patient has been referred for CBT with Ms. Peacock, missed her first appointment, scheduled for March.  Pending labs-TSH, lipid panel, hemoglobin A1c, vitamin B12, folate, vitamin D, prolactin.  She will continue to follow-up with neurology, she is on donpezil per neurology for cognitive disorder unspecified, word finding difficulty. She denies any significant memory symptoms at this time.  Memory problems likely due to her  polypharmacy, being on narcotic pain management, history of being on benzodiazepines and so on.  Her depressive symptoms could also have  contributed to her history of memory issues. Today she presented as alert and oriented as well as was able to give her personal information as well as information about her medications accurately.  Follow up in clinic in 6-8 weeks or sooner if needed.  More than 50 % of the time was spent for psychoeducation and supportive psychotherapy and care coordination.  This note was generated in part or whole with voice recognition software. Voice recognition is usually quite accurate but there are transcription errors that can and very often do occur. I apologize for any typographical errors that were not detected and corrected.           Ursula Alert, MD 06/17/2017, 2:53 PM

## 2017-06-26 ENCOUNTER — Ambulatory Visit: Payer: Medicare Other | Admitting: Physical Therapy

## 2017-07-08 ENCOUNTER — Encounter: Payer: Self-pay | Admitting: Emergency Medicine

## 2017-07-08 ENCOUNTER — Other Ambulatory Visit: Payer: Self-pay

## 2017-07-08 ENCOUNTER — Emergency Department
Admission: EM | Admit: 2017-07-08 | Discharge: 2017-07-08 | Disposition: A | Discharge status: Home / Self Care | Payer: Medicare Other | Attending: Emergency Medicine | Admitting: Emergency Medicine

## 2017-07-08 ENCOUNTER — Ambulatory Visit: Payer: Medicare Other | Admitting: Licensed Clinical Social Worker

## 2017-07-08 DIAGNOSIS — R251 Tremor, unspecified: Secondary | ICD-10-CM | POA: Diagnosis present

## 2017-07-08 DIAGNOSIS — Z96653 Presence of artificial knee joint, bilateral: Secondary | ICD-10-CM | POA: Insufficient documentation

## 2017-07-08 DIAGNOSIS — Z7984 Long term (current) use of oral hypoglycemic drugs: Secondary | ICD-10-CM | POA: Insufficient documentation

## 2017-07-08 DIAGNOSIS — Z79899 Other long term (current) drug therapy: Secondary | ICD-10-CM | POA: Insufficient documentation

## 2017-07-08 DIAGNOSIS — E119 Type 2 diabetes mellitus without complications: Secondary | ICD-10-CM | POA: Insufficient documentation

## 2017-07-08 DIAGNOSIS — J449 Chronic obstructive pulmonary disease, unspecified: Secondary | ICD-10-CM | POA: Diagnosis not present

## 2017-07-08 DIAGNOSIS — G801 Spastic diplegic cerebral palsy: Secondary | ICD-10-CM | POA: Diagnosis not present

## 2017-07-08 DIAGNOSIS — I10 Essential (primary) hypertension: Secondary | ICD-10-CM | POA: Diagnosis not present

## 2017-07-08 NOTE — ED Triage Notes (Signed)
States has had tremor both hands x 3 days. States started Prozac, donezapril  and hydroxyzine 2 months ago and wonders if one of those is the cause.

## 2017-07-08 NOTE — ED Provider Notes (Signed)
[facility] Emergency Department Provider Note  ____________________________________________  Time seen: Approximately 7:50 PM  I have reviewed the triage vital signs and the nursing notes.   HISTORY  Chief Complaint Tremors    HPI Traci Davis is a 47 y.o. female who complains of worsening tremor and bilateral hand and occasional muscle spasms over the past 3 days. About 2 weeks ago she had a change in her medication regimen including from fluoxetine to fluoxetine and starting Aricept. 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 No acute complaints such as chest pain shortness of breath belly pain and vomiting bodyaches weakness fevers or chills. Otherwise in her usual state of health. She's been taking all her medications as prescribed.     Past Medical History:  Diagnosis Date  . Anxiety   . Arthritis   . Asthma   . Cerebral palsy ([place])   . COPD (chronic obstructive pulmonary disease) ([place])   . Depression   . Diabetes mellitus without complication ([place])   . GERD (gastroesophageal reflux disease)   . Hypertension      Patient Active Problem List   Diagnosis Date Noted  . Chest pain with moderate risk for cardiac etiology 05/19/2017  . History of depression 04/15/2017  . Generalized anxiety disorder 04/15/2017  . Chronic daily headache 04/15/2017  . Panic attack 04/15/2017  . Nocturnal enuresis 04/15/2017  . Mild cognitive impairment 04/15/2017  . Loss of memory 01/14/2017  . Pain medication agreement signed 01/08/2017  . Headache disorder 12/04/2016  . Chronic, continuous use of opioids 07/01/2015  . Vertigo 07/01/2015  . Chronic midline low back pain without sciatica 03/21/2015  . Acute renal failure (ARF) ([place]) 02/19/2015  . Type 2 diabetes mellitus without complication ([place]) 67/67/2094  . Acute pancreatitis 09/04/2014  . Cerebral palsy ([place]) 01/05/2014  .  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 Hip pain, chronic 04/28/2013  . Essential hypertension 01/10/2013  . Irritable colon 01/10/2013  . Noninfectious gastroenteritis and colitis 01/10/2013  . Encounter for long-term (current) use of other medications 12/04/2012  . Esophageal reflux 08/12/2012  . Epigastric pain 08/12/2012  . Diarrhea 08/12/2012  . Primary localized osteoarthrosis, lower leg 05/07/2012  . Difficulty walking 02/06/2012     Past Surgical History:  Procedure Laterality Date  . CESAREAN SECTION  1995  . CHOLECYSTECTOMY    . FOOT CAPSULE RELEASE W/ PERCUTANEOUS HEEL CORD LENGTHENING, TIBIAL TENDON TRANSFER     age 78 ,and 2010-both legs  . JOINT REPLACEMENT Right    both knees partial replacements dates unknown  . knee replacment     x 5 per pt. last one- left knee 2014. has had 2 on R 3 on L     Prior to Admission medications   Medication Sig Start Date End Date Taking? Authorizing Provider  ARIPiprazole (ABILIFY) 5 MG tablet Take 1 tablet (5 mg total) by mouth at bedtime. 05/14/17  Yes Eappen, Ria Clock, MD  donepezil (ARICEPT) 10 MG tablet Take 10 mg by mouth daily.  05/21/17  Yes [provider]  DULoxetine (CYMBALTA) 20 MG capsule Take 2 capsules (40 mg total) by mouth daily. 06/17/17  Yes Ursula Alert, MD  FLUoxetine (PROZAC) 40 MG capsule Take 1 capsule (40 mg total) by mouth daily. 06/17/17  Yes Ursula Alert, MD  furosemide (LASIX) 40 MG tablet Take 40 mg by mouth.   Yes [provider]  glimepiride (AMARYL) 4  MG tablet Take 4 mg by mouth daily with breakfast.   Yes [provider]  HYDROmorphone (DILAUDID) 2 MG tablet Take 1 tablet by mouth every 6 (six) hours as needed.  06/24/17  Yes [provider]  hydrOXYzine (VISTARIL) 25 MG capsule Take 1 capsule (25 mg total) by mouth 3 (three) times daily as needed for anxiety. Only for severe anxiety attacks 06/17/17  Yes Eappen, Ria Clock, MD  incobotulinumtoxinA  (XEOMIN) 100 units SOLR injection Inject 100 Units into the muscle every 3 (three) months.  03/29/15  Yes [provider]  lisinopril (PRINIVIL,ZESTRIL) 20 MG tablet Take 20 mg by mouth daily.   Yes [provider]  metFORMIN (GLUCOPHAGE) 500 MG tablet Take 500 mg by mouth 2 (two) times daily.    Yes [provider]  omeprazole (PRILOSEC) 40 MG capsule Take 40 mg by mouth 2 (two) times daily.   Yes [provider]  pioglitazone (ACTOS) 45 MG tablet Take 45 mg by mouth daily.   Yes [provider]  potassium chloride SA (K-DUR,KLOR-CON) 20 MEQ tablet Take 20 mEq by mouth 2 (two) times daily.   Yes [provider]  pregabalin (LYRICA) 50 MG capsule TAKE (2) CAPSULES TWICE DAILY. 05/09/17  Yes [provider]  solifenacin (VESICARE) 5 MG tablet Take 5 mg by mouth daily.   Yes [provider]  tiotropium (SPIRIVA HANDIHALER) 18 MCG inhalation capsule Place 18 mcg into inhaler and inhale daily.  07/08/13  Yes [provider]  tiZANidine (ZANAFLEX) 4 MG tablet Take 1 tablet by mouth 3 (three) times daily. 06/24/17  Yes [provider]  topiramate (TOPAMAX) 100 MG tablet Take 100 mg by mouth daily. 07/17/16  Yes [provider]  albuterol (PROVENTIL HFA;VENTOLIN HFA) 108 (90 BASE) MCG/ACT inhaler Inhale 2 puffs into the lungs 4 (four) times daily as needed. For shortness of breath and/or wheezing 09/22/14   [provider]  Blood Glucose Monitoring Suppl (GLUCOCOM BLOOD GLUCOSE MONITOR) DEVI Test daily before all meals/snacks and once before bedtime. 09/07/14   [provider]  budesonide-formoterol (SYMBICORT) 160-4.5 MCG/ACT inhaler 2 PUMP(S) INHALATION TWO TIMES A DAY 09/29/15   [provider]  glucose blood (ONE TOUCH ULTRA TEST) test strip USE TO TEST BLOOD SUGAR TWO TIMES A DAY 12/07/14   [provider]  Influenza Vac Typ A&B Surf Ant (FLUVIRIN) 0.5 ML SUSY Inject into the  muscle.    [provider]  Lancets (FREESTYLE) lancets Test daily before all meals/snacks 09/07/14   [provider]  nystatin (MYCOSTATIN/NYSTOP) powder 1 application daily. 04/10/13   [provider]  polyethylene glycol (MIRALAX / GLYCOLAX) packet Use 17 gram or 1 capful in 8 oz of fluid of your choice three times a day until having soft stools, then back down to once a day. 09/28/15   [provider]  promethazine (PHENERGAN) 25 MG tablet Take 25 mg by mouth 3 (three) times daily as needed for nausea or vomiting.     [provider]     Allergies Cephalexin; Meloxicam; Baclofen; Morphine and related; Vantin  [cefpodoxime]; Ciprofloxacin; and Tape   Family History  Problem Relation Age of Onset  . CAD Father   . Heart attack Brother   . Sexual abuse Paternal Grandfather     Social History Social History   Tobacco Use  . Smoking status: Never Smoker  . Smokeless tobacco: Never Used  Substance Use Topics  . Alcohol use: No  . Drug use:  No    Review of Systems  Constitutional:   No fever or chills.  ENT:   No sore throat. No rhinorrhea. Cardiovascular:   No chest pain or syncope. Respiratory:   No dyspnea or cough. Gastrointestinal:   Negative for abdominal pain, vomiting and diarrhea.  Musculoskeletal:   Negative for focal pain or swelling All other systems reviewed and are negative except as documented above in ROS and HPI.  ____________________________________________   PHYSICAL EXAM:  VITAL SIGNS: ED Triage Vitals  Enc Vitals Group     BP 07/08/17 1547 128/66     Pulse Rate 07/08/17 1547 71     Resp 07/08/17 1547 18     Temp 07/08/17 1547 97.7 F (36.5 C)     Temp Source 07/08/17 1547 Oral     SpO2 07/08/17 1547 98 %     Weight 07/08/17 1549 263 lb (119.3 kg)     Height 07/08/17 1549 5\' 2"  (1.575 m)     Head Circumference --      Peak Flow --      Pain Score 07/08/17 1548 6     Pain Loc --      Pain Edu? --       Excl. in [place]? --     Vital signs reviewed, nursing assessments reviewed.   Constitutional:   Alert and oriented. Well appearing and in no distress. Eyes:   No scleral icterus.  EOMI. No nystagmus. No conjunctival pallor. PERRL. ENT   Head:   Normocephalic and atraumatic.   Nose:   No congestion/rhinnorhea.    Mouth/Throat:   MMM, no pharyngeal erythema. No peritonsillar mass.    Neck:   No meningismus. Full ROM. Hematological/Lymphatic/Immunilogical:   No cervical lymphadenopathy. Cardiovascular:   RRR. Symmetric bilateral radial and DP pulses.  No murmurs.  Respiratory:   Normal respiratory effort without tachypnea/retractions. Breath sounds are clear and equal bilaterally. No wheezes/rales/rhonchi. Gastrointestinal:   Soft and nontender. Non distended. There is no CVA tenderness.  No rebound, rigidity, or guarding. Genitourinary:   deferred Musculoskeletal:   Normal range of motion in all extremities. No joint effusions.  No lower extremity tenderness.  No edema. Neurologic:   Normal speech and language.  Motor grossly intact. occasional muscle spasms No acute focal neurologic deficits are appreciated.  Skin:    Skin is warm, dry and intact. No rash noted.  No petechiae, purpura, or bullae.  ____________________________________________    LABS (pertinent positives/negatives) (all labs ordered are listed, but only abnormal results are displayed) Labs Reviewed - No data to display ____________________________________________   EKG    ____________________________________________    RADIOLOGY  No results found.  ____________________________________________   PROCEDURES Procedures  ____________________________________________    CLINICAL IMPRESSION / ASSESSMENT AND PLAN / ED COURSE  Pertinent labs & imaging results that were available during my care of the patient were reviewed by me and considered in my medical decision making (see chart for  details).   patient well-appearing no acute distress, normal vital signs, reassuring exam, neurologically intact Think her symptoms are Reemergence of her underlying cerebral palsy symptoms that may be unmasked due to a recent change in her medication regimen including her antidepressant.she is only been on the new antidepressant for about 2 weeks, so I think she likely is having some decreased effect on neuro transmitter modulation during this transition which may be allowing her cerebral palsy symptoms to worsen temporarily. I advised her to continue taking all her medications as prescribed and  follow-up with her specialist at [facility] and her primary care doctor. At this time she has no acute complaints and I see no reason for any advanced imaging or labs.       ____________________________________________   FINAL CLINICAL IMPRESSION(S) / ED DIAGNOSES    Final diagnoses:  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[facility])     ED Discharge Orders    None      Portions of this note were generated with dragon dictation software. Dictation errors may occur despite best attempts at proofreading.    Carrie Mew, MD 07/08/17 Karl Bales

## 2017-07-08 NOTE — ED Notes (Signed)
Attempt to draw blood from R hand unsuccessful.

## 2017-07-08 NOTE — ED Notes (Signed)
Pt states she has had hand tremors x 3 days, and states "I know what its from, it from those medicines I been taking". Pt states she has been taking the medications for a couple months but tremors only started 3 days ago.

## 2017-07-10 ENCOUNTER — Telehealth: Payer: Self-pay

## 2017-07-10 NOTE — Telephone Encounter (Signed)
pt called left message that she can not take the duloxetine and fluoxetine.  she states her arms and hands are jerking really bad for the past 5 days.

## 2017-07-11 ENCOUNTER — Telehealth: Payer: Self-pay | Admitting: Psychiatry

## 2017-07-11 NOTE — Telephone Encounter (Signed)
Discussed with patient to stop the Duloxetine and see if her side effects get better. Also discussed the possibility of serotonin syndrome when taking medications together like the prozac and duloxetine. Discussed to go to ED if sx get worse. Discussed to come back in to see writer sooner if is not improving.

## 2017-07-14 ENCOUNTER — Other Ambulatory Visit: Payer: Self-pay

## 2017-07-14 ENCOUNTER — Emergency Department
Admission: EM | Admit: 2017-07-14 | Discharge: 2017-07-14 | Disposition: A | Discharge status: Home / Self Care | Payer: Medicare Other | Attending: Student in an Organized Health Care Education/Training Program | Admitting: Student in an Organized Health Care Education/Training Program

## 2017-07-14 ENCOUNTER — Encounter: Payer: Self-pay | Admitting: Emergency Medicine

## 2017-07-14 ENCOUNTER — Emergency Department: Payer: Medicare Other

## 2017-07-14 DIAGNOSIS — G809 Cerebral palsy, unspecified: Secondary | ICD-10-CM | POA: Insufficient documentation

## 2017-07-14 DIAGNOSIS — Z79899 Other long term (current) drug therapy: Secondary | ICD-10-CM | POA: Diagnosis not present

## 2017-07-14 DIAGNOSIS — J449 Chronic obstructive pulmonary disease, unspecified: Secondary | ICD-10-CM | POA: Diagnosis not present

## 2017-07-14 DIAGNOSIS — J45909 Unspecified asthma, uncomplicated: Secondary | ICD-10-CM | POA: Diagnosis not present

## 2017-07-14 DIAGNOSIS — E119 Type 2 diabetes mellitus without complications: Secondary | ICD-10-CM | POA: Diagnosis not present

## 2017-07-14 DIAGNOSIS — Z7984 Long term (current) use of oral hypoglycemic drugs: Secondary | ICD-10-CM | POA: Insufficient documentation

## 2017-07-14 DIAGNOSIS — R1013 Epigastric pain: Secondary | ICD-10-CM

## 2017-07-14 DIAGNOSIS — I10 Essential (primary) hypertension: Secondary | ICD-10-CM | POA: Insufficient documentation

## 2017-07-14 DIAGNOSIS — Z96653 Presence of artificial knee joint, bilateral: Secondary | ICD-10-CM | POA: Insufficient documentation

## 2017-07-14 LAB — URINALYSIS, COMPLETE (UACMP) WITH MICROSCOPIC
BACTERIA UA: NONE SEEN
Bilirubin Urine: NEGATIVE
Glucose, UA: NEGATIVE mg/dL
Ketones, ur: NEGATIVE mg/dL
Leukocytes, UA: NEGATIVE
Nitrite: NEGATIVE
PROTEIN: NEGATIVE mg/dL
Specific Gravity, Urine: 1.01 (ref 1.005–1.030)
pH: 6 (ref 5.0–8.0)

## 2017-07-14 LAB — COMPREHENSIVE METABOLIC PANEL
ALBUMIN: 4.3 g/dL (ref 3.5–5.0)
ALK PHOS: 104 U/L (ref 38–126)
ALT: 16 U/L (ref 14–54)
ANION GAP: 11 (ref 5–15)
AST: 18 U/L (ref 15–41)
BUN: 20 mg/dL (ref 6–20)
CO2: 24 mmol/L (ref 22–32)
Calcium: 9.5 mg/dL (ref 8.9–10.3)
Chloride: 106 mmol/L (ref 101–111)
Creatinine, Ser: 1.05 mg/dL — ABNORMAL HIGH (ref 0.44–1.00)
GFR calc Af Amer: >60 mL/min (ref >60)
GFR calc non Af Amer: >60 mL/min (ref >60)
GLUCOSE: 92 mg/dL (ref 65–99)
POTASSIUM: 3.5 mmol/L (ref 3.5–5.1)
SODIUM: 141 mmol/L (ref 135–145)
Total Bilirubin: 0.4 mg/dL (ref 0.3–1.2)
Total Protein: 8.1 g/dL (ref 6.5–8.1)

## 2017-07-14 LAB — CBC
HEMATOCRIT: 46.1 % (ref 35.0–47.0)
Hemoglobin: 15 g/dL (ref 12.0–16.0)
MCH: 27.3 pg (ref 26.0–34.0)
MCHC: 32.6 g/dL (ref 32.0–36.0)
MCV: 83.9 fL (ref 80.0–100.0)
Platelets: 291 10*3/uL (ref 150–440)
RBC: 5.49 MIL/uL — ABNORMAL HIGH (ref 3.80–5.20)
RDW: 15.1 % — ABNORMAL HIGH (ref 11.5–14.5)
WBC: 11.3 10*3/uL — ABNORMAL HIGH (ref 3.6–11.0)

## 2017-07-14 LAB — POCT PREGNANCY, URINE: PREG TEST UR: NEGATIVE

## 2017-07-14 LAB — LIPASE, BLOOD: Lipase: 22 U/L (ref 11–51)

## 2017-07-14 MED ORDER — PROMETHAZINE HCL 12.5 MG PO TABS: 12.5000 mg | ORAL_TABLET | Freq: Four times a day (QID) | ORAL | 0 refills | Status: DC | PRN

## 2017-07-14 MED ORDER — GI COCKTAIL ~~LOC~~
30.0000 mL | Freq: Once | ORAL | Status: AC
  Administered 2017-07-14: 30 mL via ORAL
  Filled 2017-07-14: qty 30

## 2017-07-14 MED ORDER — DICYCLOMINE HCL 10 MG PO CAPS: 10.0000 mg | ORAL_CAPSULE | Freq: Three times a day (TID) | ORAL | 0 refills | Status: DC | PRN

## 2017-07-14 NOTE — ED Triage Notes (Signed)
Pt has chronic hx of pancreatitis, pt states sxs started about 4 days ago. Pt states she ate foods she wasn't supposed to which caused her sxs to exacerbate, pt states she has tried meds at home with no relief.  Pt states she has had diarrhea x 4 days, 2 episodes today.  Pt has had nausea and dry heaves.

## 2017-07-14 NOTE — ED Notes (Signed)
Pt unable to give urine sample at this time; pt has specimen cup. 

## 2017-07-14 NOTE — ED Provider Notes (Signed)
[facility] Emergency Department Provider Note    First MD Initiated Contact with Patient 07/14/17 2017     (approximate)  I have reviewed the triage vital signs and the nursing notes.   HISTORY  Chief Complaint Pancreatitis    HPI Traci Davis is a 47 y.o. female multiple chronic medical conditions presents to the ER for chief complaint of 4 days of epigastric and left-sided abdominal pain he says is pancreatitis.  States the symptoms started after she ate 2 slices of spam.  Denies any fevers.  States that she has burning epigastric pain.  No chest pain or shortness of breath.  Denies any blood in her stools.  States she also has a history of heartburn and feels that her heart is burning with mild to moderate discomfort.  Past Medical History:  Diagnosis Date  . Anxiety   . Arthritis   . Asthma   . Cerebral palsy ([place])   . COPD (chronic obstructive pulmonary disease) ([place])   . Depression   . Diabetes mellitus without complication ([place])   . GERD (gastroesophageal reflux disease)   . Hypertension    Family History  Problem Relation Age of Onset  . CAD Father   . Heart attack Brother   . Sexual abuse Paternal Grandfather    Past Surgical History:  Procedure Laterality Date  . CESAREAN SECTION  1995  . CHOLECYSTECTOMY    . FOOT CAPSULE RELEASE W/ PERCUTANEOUS HEEL CORD LENGTHENING, TIBIAL TENDON TRANSFER     age 68 ,and 2010-both legs  . JOINT REPLACEMENT Right    both knees partial replacements dates unknown  . knee replacment     x 5 per pt. last one- left knee 2014. has had 2 on R 3 on L   Patient Active Problem List   Diagnosis Date Noted  . Chest pain with moderate risk for cardiac etiology 05/19/2017  . History of depression 04/15/2017  . Generalized anxiety disorder 04/15/2017  . Chronic daily headache 04/15/2017  . Panic attack 04/15/2017  . Nocturnal enuresis 04/15/2017  . Mild cognitive impairment 04/15/2017  . Loss of memory  01/14/2017  . Pain medication agreement signed 01/08/2017  . Headache disorder 12/04/2016  . Chronic, continuous use of opioids 07/01/2015  . Vertigo 07/01/2015  . Chronic midline low back pain without sciatica 03/21/2015  . Acute renal failure (ARF) ([place]) 02/19/2015  . Type 2 diabetes mellitus without complication ([place]) 28/41/3244  . Acute pancreatitis 09/04/2014  . Cerebral palsy ([place]) 01/05/2014  . 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 Hip pain, chronic 04/28/2013  . Essential hypertension 01/10/2013  . Irritable colon 01/10/2013  . Noninfectious gastroenteritis and colitis 01/10/2013  . Encounter for long-term (current) use of other medications 12/04/2012  . Esophageal reflux 08/12/2012  . Epigastric pain 08/12/2012  . Diarrhea 08/12/2012  . Primary localized osteoarthrosis, lower leg 05/07/2012  . Difficulty walking 02/06/2012      Prior to Admission medications   Medication Sig Start Date End Date Taking? Authorizing Provider  albuterol (PROVENTIL HFA;VENTOLIN HFA) 108 (90 BASE) MCG/ACT inhaler Inhale 2 puffs into the lungs 4 (four) times daily as needed. For shortness of breath and/or wheezing 09/22/14   [provider]  ARIPiprazole (ABILIFY) 5 MG tablet Take 1 tablet (5 mg total) by mouth at bedtime. 05/14/17   Ursula Alert, MD  Blood Glucose Monitoring Suppl (GLUCOCOM BLOOD GLUCOSE MONITOR) DEVI Test daily before all meals/snacks and once before bedtime. 09/07/14   [provider]  budesonide-formoterol (SYMBICORT) 160-4.5 MCG/ACT inhaler 2 PUMP(S) INHALATION TWO TIMES A DAY 09/29/15   [provider]  dicyclomine (BENTYL) 10 MG capsule Take 1 capsule (10 mg total) by mouth 3 (three) times daily as needed for up to 14 days for spasms. 07/14/17 07/28/17  Merlyn Lot, MD  donepezil (ARICEPT) 10 MG tablet Take 10 mg by mouth daily.  05/21/17   [provider]  DULoxetine (CYMBALTA) 20 MG capsule Take 2 capsules (40 mg  total) by mouth daily. 06/17/17   Ursula Alert, MD  FLUoxetine (PROZAC) 40 MG capsule Take 1 capsule (40 mg total) by mouth daily. 06/17/17   Ursula Alert, MD  furosemide (LASIX) 40 MG tablet Take 40 mg by mouth.    [provider]  glimepiride (AMARYL) 4 MG tablet Take 4 mg by mouth daily with breakfast.    [provider]  glucose blood (ONE TOUCH ULTRA TEST) test strip USE TO TEST BLOOD SUGAR TWO TIMES A DAY 12/07/14   [provider]  HYDROmorphone (DILAUDID) 2 MG tablet Take 1 tablet by mouth every 6 (six) hours as needed.  06/24/17   [provider]  hydrOXYzine (VISTARIL) 25 MG capsule Take 1 capsule (25 mg total) by mouth 3 (three) times daily as needed for anxiety. Only for severe anxiety attacks 06/17/17   Ursula Alert, MD  incobotulinumtoxinA (XEOMIN) 100 units SOLR injection Inject 100 Units into the muscle every 3 (three) months.  03/29/15   [provider]  Influenza Vac Typ A&B Surf Ant (FLUVIRIN) 0.5 ML SUSY Inject into the muscle.    [provider]  Lancets (FREESTYLE) lancets Test daily before all meals/snacks 09/07/14   [provider]  lisinopril (PRINIVIL,ZESTRIL) 20 MG tablet Take 20 mg by mouth daily.    [provider]  metFORMIN (GLUCOPHAGE) 500 MG tablet Take 500 mg by mouth 2 (two) times daily.     [provider]  nystatin (MYCOSTATIN/NYSTOP) powder 1 application daily. 04/10/13   [provider]  omeprazole (PRILOSEC) 40 MG capsule Take 40 mg by mouth 2 (two) times daily.    [provider]  pioglitazone (ACTOS) 45 MG tablet Take 45 mg by mouth daily.    [provider]  polyethylene glycol (MIRALAX / GLYCOLAX) packet Use 17 gram or 1 capful in 8 oz of fluid of your choice three times a day until having soft stools, then back down to once a day. 09/28/15   [provider]  potassium chloride SA (K-DUR,KLOR-CON) 20 MEQ tablet Take 20 mEq by mouth 2 (two)  times daily.    [provider]  pregabalin (LYRICA) 50 MG capsule TAKE (2) CAPSULES TWICE DAILY. 05/09/17   [provider]  promethazine (PHENERGAN) 12.5 MG tablet Take 1 tablet (12.5 mg total) by mouth every 6 (six) hours as needed for nausea or vomiting. 07/14/17   Merlyn Lot, MD  promethazine (PHENERGAN) 25 MG tablet Take 25 mg by mouth 3 (three) times daily as needed for nausea or vomiting.     [provider]  solifenacin (VESICARE) 5 MG tablet Take 5 mg by mouth daily.    [provider]  tiotropium (SPIRIVA HANDIHALER) 18 MCG inhalation capsule Place 18 mcg into inhaler and inhale daily.  07/08/13   [provider]  tiZANidine (ZANAFLEX) 4 MG tablet Take 1 tablet by mouth 3 (three) times daily. 06/24/17   [provider]  topiramate (TOPAMAX) 100 MG tablet Take 100 mg by mouth  daily. 07/17/16   [provider]    Allergies Cephalexin; Meloxicam; Baclofen; Morphine and related; Vantin  [cefpodoxime]; Ciprofloxacin; and Tape    Social History Social History   Tobacco Use  . Smoking status: Never Smoker  . Smokeless tobacco: Never Used  Substance Use Topics  . Alcohol use: No  . Drug use: No    Review of Systems Patient denies headaches, rhinorrhea, blurry vision, numbness, shortness of breath, chest pain, edema, cough, abdominal pain, nausea, vomiting, diarrhea, dysuria, fevers, rashes or hallucinations unless otherwise stated above in HPI. ____________________________________________   PHYSICAL EXAM:  VITAL SIGNS: Vitals:   07/14/17 2130 07/14/17 2245  BP: 115/75 127/64  Pulse: 73 72  Resp:    Temp:    SpO2: 100% 97%    Constitutional: Alert and oriented. Anxious but well appearing and in no acute distress. Eyes: Conjunctivae are normal.  Head: Atraumatic. Nose: No congestion/rhinnorhea. Mouth/Throat: Mucous membranes are moist.   Neck: No stridor. Painless ROM.  Cardiovascular: Normal rate,  regular rhythm. Grossly normal heart sounds.  Good peripheral circulation. Respiratory: Normal respiratory effort.  No retractions. Lungs CTAB. Gastrointestinal: Soft and nontender. No distention. No abdominal bruits. No CVA tenderness. Genitourinary:  Musculoskeletal: No lower extremity tenderness nor edema.  No joint effusions. Neurologic:  Normal speech and language. No gross focal neurologic deficits are appreciated. No facial droop Skin:  Skin is warm, dry and intact. No rash noted. Psychiatric: Mood and affect are normal. Speech and behavior are normal.  ____________________________________________   LABS (all labs ordered are listed, but only abnormal results are displayed)  Results for orders placed or performed during the hospital encounter of 07/14/17 (from the past 24 hour(s))  Lipase, blood     Status: None   Collection Time: 07/14/17  5:35 PM  Result Value Ref Range   Lipase 22 11 - 51 U/L  Comprehensive metabolic panel     Status: Abnormal   Collection Time: 07/14/17  5:35 PM  Result Value Ref Range   Sodium 141 135 - 145 mmol/L   Potassium 3.5 3.5 - 5.1 mmol/L   Chloride 106 101 - 111 mmol/L   CO2 24 22 - 32 mmol/L   Glucose, Bld 92 65 - 99 mg/dL   BUN 20 6 - 20 mg/dL   Creatinine, Ser 1.05 (H) 0.44 - 1.00 mg/dL   Calcium 9.5 8.9 - 10.3 mg/dL   Total Protein 8.1 6.5 - 8.1 g/dL   Albumin 4.3 3.5 - 5.0 g/dL   AST 18 15 - 41 U/L   ALT 16 14 - 54 U/L   Alkaline Phosphatase 104 38 - 126 U/L   Total Bilirubin 0.4 0.3 - 1.2 mg/dL   GFR calc non Af Amer >60 >60 mL/min   GFR calc Af Amer >60 >60 mL/min   Anion gap 11 5 - 15  CBC     Status: Abnormal   Collection Time: 07/14/17  5:35 PM  Result Value Ref Range   WBC 11.3 (H) 3.6 - 11.0 K/uL   RBC 5.49 (H) 3.80 - 5.20 MIL/uL   Hemoglobin 15.0 12.0 - 16.0 g/dL   HCT 46.1 35.0 - 47.0 %   MCV 83.9 80.0 - 100.0 fL   MCH 27.3 26.0 - 34.0 pg   MCHC 32.6 32.0 - 36.0 g/dL   RDW 15.1 (H) 11.5 - 14.5 %   Platelets 291 150  - 440 K/uL  Urinalysis, Complete w Microscopic     Status: Abnormal   Collection Time:  07/14/17  5:35 PM  Result Value Ref Range   Color, Urine YELLOW (A) YELLOW   APPearance CLEAR (A) CLEAR   Specific Gravity, Urine 1.010 1.005 - 1.030   pH 6.0 5.0 - 8.0   Glucose, UA NEGATIVE NEGATIVE mg/dL   Hgb urine dipstick LARGE (A) NEGATIVE   Bilirubin Urine NEGATIVE NEGATIVE   Ketones, ur NEGATIVE NEGATIVE mg/dL   Protein, ur NEGATIVE NEGATIVE mg/dL   Nitrite NEGATIVE NEGATIVE   Leukocytes, UA NEGATIVE NEGATIVE   RBC / HPF 0-5 0 - 5 RBC/hpf   WBC, UA 0-5 0 - 5 WBC/hpf   Bacteria, UA NONE SEEN NONE SEEN   Squamous Epithelial / LPF 0-5 (A) NONE SEEN   Mucus PRESENT   Pregnancy, urine POC     Status: None   Collection Time: 07/14/17  6:37 PM  Result Value Ref Range   Preg Test, Ur NEGATIVE NEGATIVE   ____________________________________________  EKG My review and personal interpretation at Time: 17:23   Indication: abd pain  Rate: 75  Rhythm: sinus Axis: normal Other: normal intervals, no stemi,  ____________________________________________  RADIOLOGY  I personally reviewed all radiographic images ordered to evaluate for the above acute complaints and reviewed radiology reports and findings.  These findings were personally discussed with the patient.  Please see medical record for radiology report.  ____________________________________________   PROCEDURES  Procedure(s) performed:  Procedures    Critical Care performed: no ____________________________________________   INITIAL IMPRESSION / ASSESSMENT AND PLAN / ED COURSE  Pertinent labs & imaging results that were available during my care of the patient were reviewed by me and considered in my medical decision making (see chart for details).  DDX: Pancreatitis, gastritis, heartburn, IBS, perforation, obstruction, pneumonia, anxiety, enteritis  AUDYN DIMERCURIO is a 47 y.o. who presents to the ED with symptoms as described  above.  Patient in no acute distress.  Abdominal exam soft and benign.  Blood work is reassuring.  Not clinically consistent with pancreatitis or acute cholelithiasis.  Will order KUB to evaluate for obstructive process in stool burden.  Patient will be given GI cocktail evaluate for gastritis.  Clinical Course as of Jul 16 38  Sun Jul 14, 2017  2147 She reassessed after GI cocktail feels much improved.  X-ray shows moderate stool burden no obstructive process.  This point I do believe patient stable and appropriate for discharge home.  [PR]    Clinical Course User Index [PR] Merlyn Lot, MD     As part of my medical decision making, I reviewed the following data within the [place] notes reviewed and incorporated, Labs reviewed, notes from prior ED visits.   ____________________________________________   FINAL CLINICAL IMPRESSION(S) / ED DIAGNOSES  Final diagnoses:  Epigastric pain      NEW MEDICATIONS STARTED DURING THIS VISIT:  Discharge Medication List as of 07/14/2017  9:48 PM    START taking these medications   Details  dicyclomine (BENTYL) 10 MG capsule Take 1 capsule (10 mg total) by mouth 3 (three) times daily as needed for up to 14 days for spasms., Starting Sun 07/14/2017, Until Sun 07/28/2017, Print         Note:  This document was prepared using Dragon voice recognition software and may include unintentional dictation errors.    Merlyn Lot, MD 07/15/17 0040

## 2017-07-14 NOTE — Discharge Instructions (Signed)

## 2017-08-15 ENCOUNTER — Other Ambulatory Visit: Payer: Self-pay

## 2017-08-15 ENCOUNTER — Ambulatory Visit (INDEPENDENT_AMBULATORY_CARE_PROVIDER_SITE_OTHER): Payer: Medicare Other | Admitting: Psychiatry

## 2017-08-15 ENCOUNTER — Encounter: Payer: Self-pay | Admitting: Psychiatry

## 2017-08-15 VITALS — BP 116/76 | HR 84 | Ht 62.0 in | Wt 222.4 lb

## 2017-08-15 DIAGNOSIS — F411 Generalized anxiety disorder: Secondary | ICD-10-CM

## 2017-08-15 DIAGNOSIS — G3184 Mild cognitive impairment, so stated: Secondary | ICD-10-CM | POA: Diagnosis not present

## 2017-08-15 DIAGNOSIS — F5105 Insomnia due to other mental disorder: Secondary | ICD-10-CM

## 2017-08-15 DIAGNOSIS — F330 Major depressive disorder, recurrent, mild: Secondary | ICD-10-CM

## 2017-08-15 MED ORDER — RAMELTEON 8 MG PO TABS
8.0000 mg | ORAL_TABLET | Freq: Every day | ORAL | 2 refills | Status: DC
Start: 2017-08-15 — End: 2017-11-01

## 2017-08-15 MED ORDER — ARIPIPRAZOLE 5 MG PO TABS: 5.0000 mg | ORAL_TABLET | Freq: Every day | ORAL | 2 refills | Status: DC

## 2017-08-15 MED ORDER — FLUOXETINE HCL 40 MG PO CAPS: 40.0000 mg | ORAL_CAPSULE | Freq: Every day | ORAL | 2 refills | Status: DC
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
## 2017-08-16 ENCOUNTER — Encounter: Payer: Self-pay | Admitting: Psychiatry

## 2017-08-26 ENCOUNTER — Ambulatory Visit (INDEPENDENT_AMBULATORY_CARE_PROVIDER_SITE_OTHER): Payer: Medicare Other | Admitting: Licensed Clinical Social Worker

## 2017-08-26 ENCOUNTER — Encounter

## 2017-08-26 DIAGNOSIS — F330 Major depressive disorder, recurrent, mild: Secondary | ICD-10-CM

## 2017-08-26 DIAGNOSIS — G3184 Mild cognitive impairment, so stated: Secondary | ICD-10-CM | POA: Diagnosis not present

## 2017-08-26 DIAGNOSIS — F411 Generalized anxiety disorder: Secondary | ICD-10-CM

## 2017-08-26 DIAGNOSIS — F5105 Insomnia due to other mental disorder: Secondary | ICD-10-CM | POA: Diagnosis not present
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
## 2017-09-12 ENCOUNTER — Other Ambulatory Visit: Payer: Self-pay

## 2017-09-12 ENCOUNTER — Encounter: Payer: Self-pay | Admitting: Psychiatry

## 2017-09-12 ENCOUNTER — Ambulatory Visit (INDEPENDENT_AMBULATORY_CARE_PROVIDER_SITE_OTHER): Payer: Medicare Other | Admitting: Psychiatry

## 2017-09-12 VITALS — BP 101/62 | HR 57 | Temp 97.4°F | Ht 62.0 in

## 2017-09-12 DIAGNOSIS — F5105 Insomnia due to other mental disorder: Secondary | ICD-10-CM | POA: Diagnosis not present

## 2017-09-12 DIAGNOSIS — F411 Generalized anxiety disorder: Secondary | ICD-10-CM

## 2017-09-12 DIAGNOSIS — F330 Major depressive disorder, recurrent, mild: Secondary | ICD-10-CM

## 2017-09-12 DIAGNOSIS — G3184 Mild cognitive impairment, so stated: Secondary | ICD-10-CM | POA: Diagnosis not present

## 2017-09-12 MED ORDER — HYDROXYZINE PAMOATE 25 MG PO CAPS: 25.0000 mg | ORAL_CAPSULE | Freq: Three times a day (TID) | ORAL | 1 refills | Status: DC | PRN

## 2017-09-12 NOTE — Progress Notes (Signed)
[facility] MD OP Progress Note  09/12/2017 4:06 PM Traci Davis  MRN:  [id number]  Chief Complaint: ' I am here for follow up." Chief Complaint    Follow-up; Medication Refill     HPI: Traci Davis is a 47 year old Caucasian female, lives in [place], is on SSD, never married, has a history of anxiety, cerebral palsy, diabetes mellitus, presented to the clinic today for a follow-up visit.  Patient today reports she is doing well on the current combination of medication.  She is on Abilify, Prozac and hydroxyzine as needed.  She denies any significant panic attacks.  She however reports she has an upcoming surgery of her ear coming up.  She reports that makes her anxious when she thinks about it.  She also continues to have psychosocial stressors from her boyfriend who lives with her.  She reports he is an alcoholic and she is tired of him.  She reports she is thinking about taking a 50 B out on him.  She reports her daughter comes around more often and that has been helpful.  Her daughter is expecting a baby.  This is going to  be her first grandchild.  She reports she is excited about that.  Patient denies any suicidality.  She reports sleep is improved on the Rozerem.  Patient used to have trouble ambulating.  She uses a walker and wears leg braces.  She reports she was able to come to the appointment since her health insurance provided transportation.  Pt continues to be in psychotherapy with Ms. Peacock. Visit Diagnosis:    ICD-10-CM   1. MDD (major depressive disorder), recurrent episode, mild ([place]) F33.0   2. GAD (generalized anxiety disorder) F41.1 hydrOXYzine (VISTARIL) 25 MG capsule  3. Insomnia due to mental condition F51.05   4. MCI (mild cognitive impairment) G31.84     Past Psychiatric History: Past trials of medications like Xanax, Klonopin,cymbalta.  Past Medical History:  Past Medical History:  Diagnosis Date  . Anxiety   . Arthritis   . Asthma   . Cerebral palsy ([place])   .  COPD (chronic obstructive pulmonary disease) ([place])   . Depression   . Diabetes mellitus without complication ([place])   . GERD (gastroesophageal reflux disease)   . Hypertension     Past Surgical History:  Procedure Laterality Date  . CESAREAN SECTION  1995  . CHOLECYSTECTOMY    . FOOT CAPSULE RELEASE W/ PERCUTANEOUS HEEL CORD LENGTHENING, TIBIAL TENDON TRANSFER     age 67 ,and 2010-both legs  . JOINT REPLACEMENT Right    both knees partial replacements dates unknown  . knee replacment     x 5 per pt. last one- left knee 2014. has had 2 on R 3 on L    Family Psychiatric History: Reviewed family psychiatric history from my progress note on 06/17/2017  Family History:  Family History  Problem Relation Age of Onset  . CAD Father   . Heart attack Brother   . Sexual abuse Paternal Grandfather    Substance abuse history: Denies  Social History: I have reviewed social history from my progress note on 06/17/2017. Social History   Socioeconomic History  . Marital status: Single    Spouse name: Not on file  . Number of children: Not on file  . Years of education: Not on file  . Highest education level: Not on file  Occupational History  . Not on file  Social Needs  . Financial resource strain: Not on file  .  Food insecurity:    Worry: Not on file    Inability: Not on file  . Transportation needs:    Medical: Not on file    Non-medical: Not on file  Tobacco Use  . Smoking status: Never Smoker  . Smokeless tobacco: Never Used  Substance and Sexual Activity  . Alcohol use: No  . Drug use: No  . Sexual activity: Yes  Lifestyle  . Physical activity:    Days per week: Not on file    Minutes per session: Not on file  . Stress: Not on file  Relationships  . Social connections:    Talks on phone: Not on file    Gets together: Not on file    Attends religious service: Not on file    Active member of club or organization: Not on file    Attends meetings of clubs or  organizations: Not on file    Relationship status: Not on file  Other Topics Concern  . Not on file  Social History Narrative  . Not on file    Allergies:  Allergies  Allergen Reactions  . Cephalexin Hives  . Meloxicam Other (See Comments)    Damage kidney  . Baclofen Other (See Comments)    "makes cerebral palsy do adverse reaction on me"  . Morphine And Related Other (See Comments)    hallucinations  . Vantin  [Cefpodoxime]     Other reaction(s): Vomiting  . Ciprofloxacin Itching and Nausea And Vomiting  . Tape Rash    Other reaction(s): Other (See Comments) Uncoded Allergy. Allergen: Tape, Other Reaction: skin tears    Metabolic Disorder Labs: Lab Results  Component Value Date   HGBA1C 6.1 (H) 02/19/2015   No results found for: PROLACTIN No results found for: CHOL, TRIG, HDL, CHOLHDL, VLDL, LDLCALC Lab Results  Component Value Date   TSH 2.198 07/10/2016   TSH 2.891 02/19/2015    Therapeutic Level Labs: No results found for: LITHIUM No results found for: VALPROATE No components found for:  CBMZ  Current Medications: Current Outpatient Medications  Medication Sig Dispense Refill  . albuterol (PROVENTIL HFA;VENTOLIN HFA) 108 (90 BASE) MCG/ACT inhaler Inhale 2 puffs into the lungs 4 (four) times daily as needed. For shortness of breath and/or wheezing    . ARIPiprazole (ABILIFY) 5 MG tablet Take 1 tablet (5 mg total) by mouth at bedtime. 30 tablet 2  . atorvastatin (LIPITOR) 10 MG tablet     . Blood Glucose Monitoring Suppl (GLUCOCOM BLOOD GLUCOSE MONITOR) DEVI Test daily before all meals/snacks and once before bedtime.    . budesonide-formoterol (SYMBICORT) 160-4.5 MCG/ACT inhaler 2 PUMP(S) INHALATION TWO TIMES A DAY    . donepezil (ARICEPT) 10 MG tablet Take 10 mg by mouth daily.     Marland Kitchen FLUoxetine (PROZAC) 40 MG capsule Take 1 capsule (40 mg total) by mouth daily. 30 capsule 2  . furosemide (LASIX) 40 MG tablet Take 40 mg by mouth.    Marland Kitchen glimepiride (AMARYL) 4  MG tablet Take 4 mg by mouth daily with breakfast.    . glucose blood (ONE TOUCH ULTRA TEST) test strip USE TO TEST BLOOD SUGAR TWO TIMES A DAY    . HYDROmorphone (DILAUDID) 2 MG tablet Take 1 tablet by mouth every 6 (six) hours as needed.     . hydrOXYzine (VISTARIL) 25 MG capsule Take 1 capsule (25 mg total) by mouth 3 (three) times daily as needed for anxiety. Only for severe anxiety attacks 270 capsule 1  .  incobotulinumtoxinA (XEOMIN) 100 units SOLR injection Inject 100 Units into the muscle every 3 (three) months.     . Influenza Vac Typ A&B Surf Ant (FLUVIRIN) 0.5 ML SUSY Inject into the muscle.    . Lancets (FREESTYLE) lancets Test daily before all meals/snacks    . lisinopril (PRINIVIL,ZESTRIL) 20 MG tablet Take 20 mg by mouth daily.    . metFORMIN (GLUCOPHAGE) 500 MG tablet Take 500 mg by mouth 2 (two) times daily.     Marland Kitchen nystatin (MYCOSTATIN/NYSTOP) powder 1 application daily.    Marland Kitchen omeprazole (PRILOSEC) 40 MG capsule Take 40 mg by mouth 2 (two) times daily.    . pioglitazone (ACTOS) 45 MG tablet Take 45 mg by mouth daily.    . polyethylene glycol (MIRALAX / GLYCOLAX) packet Use 17 gram or 1 capful in 8 oz of fluid of your choice three times a day until having soft stools, then back down to once a day.    . potassium chloride SA (K-DUR,KLOR-CON) 20 MEQ tablet Take 20 mEq by mouth 2 (two) times daily.    . pregabalin (LYRICA) 50 MG capsule TAKE (2) CAPSULES TWICE DAILY.    Marland Kitchen promethazine (PHENERGAN) 12.5 MG tablet Take 1 tablet (12.5 mg total) by mouth every 6 (six) hours as needed for nausea or vomiting. 12 tablet 0  . ramelteon (ROZEREM) 8 MG tablet Take 1 tablet (8 mg total) by mouth at bedtime. 30 tablet 2  . solifenacin (VESICARE) 5 MG tablet Take 5 mg by mouth daily.    Marland Kitchen tiotropium (SPIRIVA HANDIHALER) 18 MCG inhalation capsule Place 18 mcg into inhaler and inhale daily.     Marland Kitchen tiZANidine (ZANAFLEX) 4 MG tablet Take 1 tablet by mouth 3 (three) times daily.    Marland Kitchen topiramate (TOPAMAX)  100 MG tablet Take 100 mg by mouth daily.    Marland Kitchen dicyclomine (BENTYL) 10 MG capsule Take 1 capsule (10 mg total) by mouth 3 (three) times daily as needed for up to 14 days for spasms. 16 capsule 0  . ofloxacin (FLOXIN) 0.3 % OTIC solution      No current facility-administered medications for this visit.      Musculoskeletal: Strength & Muscle Tone: within normal limits Gait & Station: walks with walker Patient leans: Front  Psychiatric Specialty Exam: Review of Systems  Psychiatric/Behavioral: The patient is nervous/anxious.   All other systems reviewed and are negative.   Blood pressure 101/62, pulse (!) 57, temperature (!) 97.4 F (36.3 C), temperature source Oral, height 5\' 2"  (1.575 m), last menstrual period 08/20/2014.Body mass index is 40.68 kg/m.  General Appearance: Casual  Eye Contact:  Fair  Speech:  Clear and Coherent  Volume:  Normal  Mood:  Anxious  Affect:  Appropriate  Thought Process:  Goal Directed and Descriptions of Associations: Intact  Orientation:  Full (Time, Place, and Person)  Thought Content: Logical   Suicidal Thoughts:  No  Homicidal Thoughts:  No  Memory:  Immediate;   Fair Recent;   Fair Remote;   Fair  Judgement:  Fair  Insight:  Fair  Psychomotor Activity:  Normal  Concentration:  Concentration: Fair and Attention Span: Fair  Recall:  [employer] of Knowledge: Fair  Language: Fair  Akathisia:  No  Handed:  Right  AIMS (if indicated):denies tremors, rigidity  Assets:  Communication Skills Desire for Improvement Social Support  ADL's:  Intact  Cognition: WNL  Sleep:  Fair   Screenings:   Assessment and Plan: Merilee is a 47 yr  old Caucasian female who has a history of depression, anxiety, chronic pain, diabetes mellitus, MCI, cerebral palsy, presented to the clinic today for a follow-up visit.  Patient reports she is currently doing well on the current combination of medication.  She does continue to have some anxiety since she has an  upcoming ear surgery coming up.  She also continues to have psychosocial stressors from her boyfriend.  She has MCI which is being managed by her neurologist.  She continues to have support from her daughter.  Plan as noted below.   Plan Depression Continue Prozac 40 mg p.o. daily Abilify 5 mg p.o. nightly  For GAD Continue Prozac 40 mg p.o. daily with breakfast Continue hydroxyzine as prescribed  Insomnia Continue Rozerem 8 mg p.o. nightly  She will continue psychotherapy with Ms. Peacock.  For MCI. She will continue to follow-up with neurology.  She is currently on donepezil which is currently being managed by neurology.  Follow-up in clinic in 2 months or sooner if needed.  More than 50 % of the time was spent for psychoeducation and supportive psychotherapy and care coordination.  This note was generated in part or whole with voice recognition software. Voice recognition is usually quite accurate but there are transcription errors that can and very often do occur. I apologize for any typographical errors that were not detected and corrected.        Ursula Alert, MD 09/13/2017, 8:23 AM

## 2017-09-13 ENCOUNTER — Encounter: Payer: Self-pay | Admitting: Psychiatry

## 2017-10-08 ENCOUNTER — Other Ambulatory Visit: Payer: Medicare Other

## 2017-10-08 ENCOUNTER — Encounter
Admission: RE | Admit: 2017-10-08 | Discharge: 2017-10-08 | Disposition: A | Discharge status: Home / Self Care | Payer: Medicare Other | Source: Ambulatory Visit | Attending: Otolaryngology | Admitting: Otolaryngology

## 2017-10-08 ENCOUNTER — Other Ambulatory Visit: Payer: Self-pay

## 2017-10-08 HISTORY — DX: Adverse effect of unspecified anesthetic, initial encounter: T41.45XA

## 2017-10-08 HISTORY — DX: Other complications of anesthesia, initial encounter: T88.59XA

## 2017-10-08 NOTE — Patient Instructions (Signed)
Your procedure is scheduled on: Wed. 6/19 Report to Day Surgery. To find out your arrival time please call [phone] between 1PM - 3PM on Tues 6/18.  Remember: Instructions that are not followed completely may result in serious medical risk, up to and including death, or upon the discretion of your surgeon and anesthesiologist your surgery may need to be rescheduled.     _X__ 1. Do not eat food after midnight the night before your procedure.                 No gum chewing or hard candies. You may drink clear liquids up to 2 hours                 before you are scheduled to arrive for your surgery- DO not drink clear                 liquids within 2 hours of the start of your surgery.                 Clear Liquids include:  water, __X__2.  On the morning of surgery brush your teeth with toothpaste and water, you  may rinse your mouth with mouthwash if you wish.  Do not swallow any              toothpaste of mouthwash.     __ 3.  No Alcohol for 24 hours before or after surgery.   ___ 4.  Do Not Smoke or use e-cigarettes For 24 Hours Prior to Your Surgery.                 Do not use any chewable tobacco products for at least 6 hours prior to                 surgery.  ____  5.  Bring all medications with you on the day of surgery if instructed.   __x__  6.  Notify your doctor if there is any change in your medical condition      (cold, fever, infections).     Do not wear jewelry, make-up, hairpins, clips or nail polish. Do not wear lotions, powders, or perfumes. You may wear deodorant. Do not shave 48 hours prior to surgery. Men may shave face and neck. Do not bring valuables to the hospital.    [facility] is not responsible for any belongings or valuables.  Contacts, dentures or bridgework may not be worn into surgery. Leave your suitcase in the car. After surgery it may be brought to your room. For patients admitted to the hospital, discharge time is  determined by your treatment team.   Patients discharged the day of surgery will not be allowed to drive home.   Please read over the following fact sheets that you were given:    __x__ Take these medicines the morning of surgery with A SIP OF WATER:    1. May take pain and muscle spasm medication if needed  2. FLUoxetine (PROZAC) 40 MG capsule  3. hydrOXYzine (VISTARIL) 25 MG capsule  4.omeprazole (PRILOSEC) 40 MG capsule take extra dose the night before and one morning of surgery  5.  6.  ____ Fleet Enema (as directed)   ____ Use CHG Soap as directed  __x__ Use inhalers on the day of surgeryalbuterol (PROVENTIL HFA;VENTOLIN HFA) 108 (90 BASE) MCG/ACT inhaler and bring with you , Tiotropium Bromide Monohydrate (SPIRIVA RESPIMAT) 1.25 MCG/ACT AERS  ____ Stop metformin 2 days prior  to surgery    ____ Take 1/2 of usual insulin dose the night before surgery. No insulin the morning          of surgery.   ____ Stop Coumadin/Plavix/aspirin on   __x__ Stop Anti-inflammatories on ibuprofen (ADVIL,MOTRIN) 200 MG tablet, aleve or aspirin until after surgery   ____ Stop supplements until after surgery.    ____ Bring C-Pap to the hospital.

## 2017-10-08 NOTE — Pre-Procedure Instructions (Signed)
Dr. Sonny Masters office notified that we still need an H&P for the upcoming surgery.

## 2017-10-10 ENCOUNTER — Ambulatory Visit: Payer: Medicare Other | Admitting: Licensed Clinical Social Worker

## 2017-11-01 ENCOUNTER — Ambulatory Visit (INDEPENDENT_AMBULATORY_CARE_PROVIDER_SITE_OTHER): Payer: Medicare Other | Admitting: Psychiatry

## 2017-11-01 ENCOUNTER — Encounter: Payer: Self-pay | Admitting: Psychiatry

## 2017-11-01 ENCOUNTER — Other Ambulatory Visit: Payer: Self-pay

## 2017-11-01 VITALS — BP 130/79 | HR 61 | Temp 97.9°F

## 2017-11-01 DIAGNOSIS — F330 Major depressive disorder, recurrent, mild: Secondary | ICD-10-CM

## 2017-11-01 DIAGNOSIS — G3184 Mild cognitive impairment, so stated: Secondary | ICD-10-CM | POA: Diagnosis not present

## 2017-11-01 DIAGNOSIS — F5105 Insomnia due to other mental disorder: Secondary | ICD-10-CM | POA: Diagnosis not present

## 2017-11-01 DIAGNOSIS — F411 Generalized anxiety disorder: Secondary | ICD-10-CM

## 2017-11-01 MED ORDER — RAMELTEON 8 MG PO TABS: 8.0000 mg | ORAL_TABLET | Freq: Every day | ORAL | 1 refills | Status: DC

## 2017-11-01 MED ORDER — ARIPIPRAZOLE 5 MG PO TABS: 5.0000 mg | ORAL_TABLET | Freq: Every day | ORAL | 1 refills | Status: DC

## 2017-11-01 MED ORDER — FLUOXETINE HCL 40 MG PO CAPS: 40.0000 mg | ORAL_CAPSULE | Freq: Every day | ORAL | 1 refills | Status: DC
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
## 2017-11-12 ENCOUNTER — Ambulatory Visit: Payer: Medicare Other | Admitting: Psychiatry

## 2018-01-08 ENCOUNTER — Ambulatory Visit: Payer: Medicare Other | Admitting: Registered Nurse

## 2018-01-08 ENCOUNTER — Ambulatory Visit
Admission: RE | Admit: 2018-01-08 | Discharge: 2018-01-08 | Disposition: A | Discharge status: Home / Self Care | Payer: Medicare Other | Source: Ambulatory Visit | Attending: Otolaryngology | Admitting: Otolaryngology

## 2018-01-08 ENCOUNTER — Other Ambulatory Visit: Payer: Self-pay

## 2018-01-08 ENCOUNTER — Encounter
Admission: RE | Disposition: A | Discharge status: Home / Self Care | Payer: Self-pay | Source: Ambulatory Visit | Attending: Otolaryngology

## 2018-01-08 ENCOUNTER — Encounter: Payer: Self-pay | Admitting: *Deleted

## 2018-01-08 DIAGNOSIS — Z7984 Long term (current) use of oral hypoglycemic drugs: Secondary | ICD-10-CM | POA: Diagnosis not present

## 2018-01-08 DIAGNOSIS — Z79899 Other long term (current) drug therapy: Secondary | ICD-10-CM | POA: Diagnosis not present

## 2018-01-08 DIAGNOSIS — Z96653 Presence of artificial knee joint, bilateral: Secondary | ICD-10-CM | POA: Diagnosis not present

## 2018-01-08 DIAGNOSIS — I10 Essential (primary) hypertension: Secondary | ICD-10-CM | POA: Diagnosis not present

## 2018-01-08 DIAGNOSIS — H7291 Unspecified perforation of tympanic membrane, right ear: Secondary | ICD-10-CM | POA: Insufficient documentation

## 2018-01-08 DIAGNOSIS — K219 Gastro-esophageal reflux disease without esophagitis: Secondary | ICD-10-CM | POA: Diagnosis not present

## 2018-01-08 DIAGNOSIS — G809 Cerebral palsy, unspecified: Secondary | ICD-10-CM | POA: Insufficient documentation

## 2018-01-08 DIAGNOSIS — E119 Type 2 diabetes mellitus without complications: Secondary | ICD-10-CM | POA: Diagnosis not present

## 2018-01-08 DIAGNOSIS — J449 Chronic obstructive pulmonary disease, unspecified: Secondary | ICD-10-CM | POA: Diagnosis not present

## 2018-01-08 HISTORY — PX: TYMPANOPLASTY WITH GRAFT: SHX6567

## 2018-01-08 LAB — GLUCOSE, CAPILLARY
Glucose-Capillary: 90 mg/dL (ref 70–99)
Glucose-Capillary: 150 mg/dL — ABNORMAL HIGH (ref 70–99)

## 2018-01-08 SURGERY — TYMPANOPLASTY, USING GRAFT
Anesthesia: General | Laterality: Right

## 2018-01-08 MED ORDER — MIDAZOLAM HCL 2 MG/2ML IJ SOLN
INTRAMUSCULAR | Status: DC | PRN
  Administered 2018-01-08: 2 mg via INTRAVENOUS

## 2018-01-08 MED ORDER — ONDANSETRON HCL 4 MG/2ML IJ SOLN
INTRAMUSCULAR | Status: AC
  Filled 2018-01-08: qty 2

## 2018-01-08 MED ORDER — ROCURONIUM BROMIDE 100 MG/10ML IV SOLN
INTRAVENOUS | Status: DC | PRN
  Administered 2018-01-08: 5 mg via INTRAVENOUS

## 2018-01-08 MED ORDER — GLYCOPYRROLATE 0.2 MG/ML IJ SOLN
INTRAMUSCULAR | Status: AC
  Filled 2018-01-08: qty 1

## 2018-01-08 MED ORDER — PHENYLEPHRINE HCL 10 MG/ML IJ SOLN
INTRAMUSCULAR | Status: AC
  Filled 2018-01-08: qty 1

## 2018-01-08 MED ORDER — EPINEPHRINE PF 1 MG/ML IJ SOLN
INTRAMUSCULAR | Status: AC
  Filled 2018-01-08: qty 1

## 2018-01-08 MED ORDER — FENTANYL CITRATE (PF) 100 MCG/2ML IJ SOLN: 25.0000 ug | INTRAMUSCULAR | Status: DC | PRN

## 2018-01-08 MED ORDER — FENTANYL CITRATE (PF) 100 MCG/2ML IJ SOLN
INTRAMUSCULAR | Status: AC
  Filled 2018-01-08: qty 2

## 2018-01-08 MED ORDER — MIDAZOLAM HCL 2 MG/2ML IJ SOLN
INTRAMUSCULAR | Status: AC
  Filled 2018-01-08: qty 2

## 2018-01-08 MED ORDER — PROPOFOL 10 MG/ML IV BOLUS
INTRAVENOUS | Status: AC
  Filled 2018-01-08: qty 20

## 2018-01-08 MED ORDER — LIDOCAINE-EPINEPHRINE (PF) 1 %-1:200000 IJ SOLN
INTRAMUSCULAR | Status: DC | PRN
  Administered 2018-01-08: 4 mL

## 2018-01-08 MED ORDER — DEXAMETHASONE SODIUM PHOSPHATE 10 MG/ML IJ SOLN
INTRAMUSCULAR | Status: AC
  Filled 2018-01-08: qty 1

## 2018-01-08 MED ORDER — SODIUM CHLORIDE 0.9 % IV SOLN
INTRAVENOUS | Status: DC
  Administered 2018-01-08: 07:00:00 via INTRAVENOUS

## 2018-01-08 MED ORDER — PHENYLEPHRINE HCL 10 MG/ML IJ SOLN
INTRAMUSCULAR | Status: DC | PRN
  Administered 2018-01-08: 200 ug via INTRAVENOUS
  Administered 2018-01-08: 100 ug via INTRAVENOUS
  Administered 2018-01-08: 200 ug via INTRAVENOUS
  Administered 2018-01-08: 100 ug via INTRAVENOUS
  Administered 2018-01-08: 200 ug via INTRAVENOUS
  Administered 2018-01-08: 100 ug via INTRAVENOUS
  Administered 2018-01-08: 200 ug via INTRAVENOUS
  Administered 2018-01-08: 100 ug via INTRAVENOUS
  Administered 2018-01-08: 200 ug via INTRAVENOUS

## 2018-01-08 MED ORDER — SUCCINYLCHOLINE CHLORIDE 20 MG/ML IJ SOLN
INTRAMUSCULAR | Status: DC | PRN
  Administered 2018-01-08: 100 mg via INTRAVENOUS

## 2018-01-08 MED ORDER — EPHEDRINE SULFATE 50 MG/ML IJ SOLN
INTRAMUSCULAR | Status: AC
  Filled 2018-01-08: qty 1

## 2018-01-08 MED ORDER — OFLOXACIN 0.3 % OP SOLN
OPHTHALMIC | Status: AC
  Filled 2018-01-08: qty 10

## 2018-01-08 MED ORDER — OFLOXACIN 0.3 % OT SOLN
OTIC | Status: DC | PRN
  Administered 2018-01-08: 5 [drp] via OTIC

## 2018-01-08 MED ORDER — CIPROFLOXACIN-DEXAMETHASONE 0.3-0.1 % OT SUSP
OTIC | Status: AC
  Filled 2018-01-08: qty 7.5

## 2018-01-08 MED ORDER — OFLOXACIN 0.3 % OP SOLN: 4.0000 [drp] | Freq: Two times a day (BID) | OPHTHALMIC | 0 refills | Status: DC

## 2018-01-08 MED ORDER — SODIUM CHLORIDE 0.9 % IV SOLN
INTRAVENOUS | Status: DC | PRN
  Administered 2018-01-08: 40 ug/min via INTRAVENOUS

## 2018-01-08 MED ORDER — PROPOFOL 10 MG/ML IV BOLUS
INTRAVENOUS | Status: DC | PRN
  Administered 2018-01-08 (×3): 50 mg via INTRAVENOUS
  Administered 2018-01-08: 150 mg via INTRAVENOUS

## 2018-01-08 MED ORDER — LIDOCAINE HCL (PF) 2 % IJ SOLN
INTRAMUSCULAR | Status: AC
  Filled 2018-01-08: qty 10

## 2018-01-08 MED ORDER — DEXAMETHASONE SODIUM PHOSPHATE 10 MG/ML IJ SOLN
INTRAMUSCULAR | Status: DC | PRN
  Administered 2018-01-08: 10 mg via INTRAVENOUS

## 2018-01-08 MED ORDER — SODIUM CHLORIDE 0.9 % IV SOLN: INTRAVENOUS | Status: DC | PRN

## 2018-01-08 MED ORDER — LIDOCAINE-EPINEPHRINE (PF) 1 %-1:200000 IJ SOLN
INTRAMUSCULAR | Status: AC
  Filled 2018-01-08: qty 30

## 2018-01-08 MED ORDER — OXYCODONE-ACETAMINOPHEN 5-325 MG PO TABS: 1.0000 | ORAL_TABLET | Freq: Four times a day (QID) | ORAL | 0 refills | Status: DC | PRN

## 2018-01-08 MED ORDER — EPHEDRINE SULFATE 50 MG/ML IJ SOLN
INTRAMUSCULAR | Status: DC | PRN
  Administered 2018-01-08 (×4): 10 mg via INTRAVENOUS

## 2018-01-08 MED ORDER — LIDOCAINE HCL (CARDIAC) PF 100 MG/5ML IV SOSY
PREFILLED_SYRINGE | INTRAVENOUS | Status: DC | PRN
  Administered 2018-01-08: 100 mg via INTRAVENOUS

## 2018-01-08 MED ORDER — FENTANYL CITRATE (PF) 100 MCG/2ML IJ SOLN
INTRAMUSCULAR | Status: DC | PRN
  Administered 2018-01-08 (×2): 50 ug via INTRAVENOUS

## 2018-01-08 MED ORDER — ROCURONIUM BROMIDE 50 MG/5ML IV SOLN
INTRAVENOUS | Status: AC
  Filled 2018-01-08: qty 1

## 2018-01-08 MED ORDER — ACETAMINOPHEN 10 MG/ML IV SOLN
INTRAVENOUS | Status: DC | PRN
  Administered 2018-01-08: 1000 mg via INTRAVENOUS

## 2018-01-08 MED ORDER — BACITRACIN ZINC 500 UNIT/GM EX OINT
TOPICAL_OINTMENT | CUTANEOUS | Status: AC
  Filled 2018-01-08: qty 28.35

## 2018-01-08 MED ORDER — GELATIN ABSORBABLE 12-7 MM EX MISC
CUTANEOUS | Status: DC | PRN
  Administered 2018-01-08: 1

## 2018-01-08 MED ORDER — OXYCODONE-ACETAMINOPHEN 5-325 MG PO TABS
ORAL_TABLET | ORAL | Status: AC
  Administered 2018-01-08: 1 via ORAL
  Filled 2018-01-08: qty 1

## 2018-01-08 MED ORDER — EPINEPHRINE 30 MG/30ML IJ SOLN
INTRAMUSCULAR | Status: AC
  Filled 2018-01-08: qty 1

## 2018-01-08 MED ORDER — GELATIN ABSORBABLE 12-7 MM EX MISC
CUTANEOUS | Status: AC
  Filled 2018-01-08: qty 1

## 2018-01-08 MED ORDER — SUCCINYLCHOLINE CHLORIDE 20 MG/ML IJ SOLN
INTRAMUSCULAR | Status: AC
  Filled 2018-01-08: qty 1

## 2018-01-08 MED ORDER — OXYCODONE-ACETAMINOPHEN 5-325 MG PO TABS
1.0000 | ORAL_TABLET | Freq: Once | ORAL | Status: AC
  Administered 2018-01-08: 1 via ORAL

## 2018-01-08 MED ORDER — BACITRACIN 500 UNIT/GM EX OINT
TOPICAL_OINTMENT | CUTANEOUS | Status: DC | PRN
  Administered 2018-01-08: 1 via TOPICAL

## 2018-01-08 MED ORDER — ONDANSETRON HCL 4 MG/2ML IJ SOLN
INTRAMUSCULAR | Status: DC | PRN
  Administered 2018-01-08: 4 mg via INTRAVENOUS

## 2018-01-08 SURGICAL SUPPLY — 39 items
ADH LQ OCL WTPRF AMP STRL LF (MISCELLANEOUS) ×2
ADHESIVE MASTISOL STRL (MISCELLANEOUS) ×6 IMPLANT
BLADE EAR TYMPAN 2.5 60D BEAV (BLADE) ×3 IMPLANT
BLADE EAR TYMPAN 2.5 STR BEAV (BLADE) ×3 IMPLANT
BLADE SURG 15 STRL LF DISP TIS (BLADE) ×1 IMPLANT
BLADE SURG 15 STRL SS (BLADE) ×3
BUR SABER DIAMOND 3.0 (BURR) IMPLANT
CANISTER SUCT 1200ML W/VALVE (MISCELLANEOUS) ×3 IMPLANT
CATH IV ANGIO 16GX3.25 GREY (CATHETERS) ×1 IMPLANT
COTTON BALL STRL MEDIUM (GAUZE/BANDAGES/DRESSINGS) IMPLANT
DRAIN PENROSE 1/4X12 LTX (DRAIN) ×3 IMPLANT
DRAPE MICROSCOPE ZEISS 48X118 (DRAPES) ×3 IMPLANT
DRAPE SURG 17X11 SM STRL (DRAPES) ×6 IMPLANT
DRSG GLASSCOCK MASTOID ADT (GAUZE/BANDAGES/DRESSINGS) ×3 IMPLANT
DRSG GLASSCOCK MASTOID PED (GAUZE/BANDAGES/DRESSINGS) IMPLANT
ELECT EMG 20MM DUAL (MISCELLANEOUS) ×6
ELECTRODE EMG 20MM DUAL (MISCELLANEOUS) ×2 IMPLANT
GLOVE BIO SURGEON STRL SZ7.5 (GLOVE) IMPLANT
GLOVE BIOGEL M 8.0 STRL (GLOVE) IMPLANT
GLOVE BIOGEL PI IND STRL 8 (GLOVE) ×2 IMPLANT
GLOVE BIOGEL PI INDICATOR 8 (GLOVE) ×4
GLOVE PROTEXIS LATEX SZ 7.5 (GLOVE) ×9 IMPLANT
GOWN STRL REUS W/ TWL LRG LVL3 (GOWN DISPOSABLE) ×2 IMPLANT
GOWN STRL REUS W/TWL LRG LVL3 (GOWN DISPOSABLE) ×6
IV CATH ANGIO 16GX3.25 GREY (CATHETERS) ×3
IV NS 500ML (IV SOLUTION) ×3
IV NS 500ML BAXH (IV SOLUTION) ×1 IMPLANT
KIT TURNOVER KIT A (KITS) ×3 IMPLANT
MARKER SKIN DUAL TIP RULER LAB (MISCELLANEOUS) ×3 IMPLANT
NEEDLE FILTER BLUNT 18X 1/2SAF (NEEDLE) ×2
NEEDLE FILTER BLUNT 18X1 1/2 (NEEDLE) ×1 IMPLANT
NS IRRIG 500ML POUR BTL (IV SOLUTION) ×3 IMPLANT
PACK HEAD/NECK (MISCELLANEOUS) ×3 IMPLANT
SLEEVE PROTECTION STRL DISP (MISCELLANEOUS) ×6 IMPLANT
SUT PLAIN GUT FAST 5-0 (SUTURE) ×3 IMPLANT
SUT VIC AB 4-0 RB1 27 (SUTURE) ×3
SUT VIC AB 4-0 RB1 27X BRD (SUTURE) ×1 IMPLANT
SYR 3ML LL SCALE MARK (SYRINGE) ×3 IMPLANT
TUBING IRRIGATION (MISCELLANEOUS) ×3 IMPLANT

## 2018-01-08 NOTE — Op Note (Signed)
01/08/2018  10:00 AM    Traci Davis  [id number]   Pre-Op Diagnosis:  RIGHT TYMPANIC MEMBRANE PERFORATION  Post-op Diagnosis: RIGHT TYMPANIC MEMBRANE PERFORATION  Procedure: Right Tympanoplasty Surgeon:  Riley Nearing  Anesthesia:  General endotracheal anesthesia  EBL:  <40 cc  Complications:  None  Findings: 40% posterior inferior perforation. Ossicles intact with normal mobility  Procedure: The patient was taken to the Operating Room and placed in the supine position.  After induction of general endotracheal anesthesia, the patient was turned 90 degrees. After a time-out confirming the consented procedure and site, 1% lidocaine with epinephrine 1:200,000 was injected into the postauricular region and, under visualization with the operating microscope, into the canal skin in the posterior and inferior canal. The right ear was then prepped and draped in the usual sterile fashion. The ear was evaluated under the operating microscope through an ear speculum, and the canal cleaned and irrigated with saline, and the perforation inspected. The findings were as described above. The margin of the perforation was carefully de-epithelialized with a pick and cup forceps, scraping the undersurface of the margin to remove all epithelium.  An incision was made with a 15 blade just behind the post-auricular crease below the hairline. The incision was carried down through the subcutaneous tissues with the Bovie, and dissection carried down to the temporalis fascia. Scissors were used to harvest an adequately sized fascia graft, which was set aside, pressed and dried for later use. Hemostasis was obtained and the wound closed with 4-0 Vicryl suture for the subcutaneous closure, followed by 5-0 fast absorbing gut suture in a running locked stitch for the skin.  Vertical canal incisions were then made at 12:00 and 6:00, followed by a horizontal incision approximately 33mm posterior to the annulus. A weapon  was used to elevate the canal skin down to the annulus, and the annulus was then carefully elevated from the tympanic ring with a Rosen needle, taking care to avoid injury to the Chorda Tympani nerve, which was preserved. The tympanomeatal flap was elevated and the middle ear inspected. The ossicles were inspected and palpated to confirm mobility. The fascial graft was then trimmed and placed into position under the perforation. Floxin moistened Gelfoam was used to pack the middle ear, supporting the graft against the undersurface of the tympanic membrane. The tympanomeatal flap was then placed back into anatomic position, and good placement of the graft confirmed. The canal was then packed with Floxin moistened Gelfoam. Bacitracin ointment was applied to the postauricular incision. A cotton ball was placed at the external meatus, and a Glasscock dressing applied to the ear.   The patient was then returned to the anesthesiologist for awakening, and was taken to the Recovery Room in stable condition.  Disposition:   PACU then discharge home  Plan: Remove the ear dressing as instructed, then start Floxin ear drops as prescribed and water precautions.  Antibiotic ointment to the postauricular wound. Follow-up at my office as scheduled.  Riley Nearing 01/08/2018 10:00 AM

## 2018-01-08 NOTE — Transfer of Care (Signed)
Immediate Anesthesia Transfer of Care Note  Patient: SOPHEAP BASIC  Procedure(s) Performed: Procedure(s): TYMPANOPLASTY WITH POSSIBLE OSSICULAR CHAIN RECONSTRUCTION (Right)  Patient Location: PACU  Anesthesia Type:General  Level of Consciousness: sedated  Airway & Oxygen Therapy: Patient Spontanous Breathing and Patient connected to face mask oxygen  Post-op Assessment: Report given to RN and Post -op Vital signs reviewed and stable  Post vital signs: Reviewed and stable  Last Vitals:  Vitals:   01/08/18 0628 01/08/18 1022  BP: 121/65 (!) 97/33  Pulse: 74 (!) 112  Resp: 14 19  Temp: (!) 36.2 C (!) 36.4 C  SpO2: 16% 58%    Complications: No apparent anesthesia complications

## 2018-01-08 NOTE — Discharge Instructions (Signed)

## 2018-01-08 NOTE — Anesthesia Procedure Notes (Signed)
Procedure Name: Intubation Date/Time: 01/08/2018 7:48 AM Performed by: Doreen Salvage, CRNA Pre-anesthesia Checklist: Patient identified, Patient being monitored, Timeout performed, Emergency Drugs available and Suction available Patient Re-evaluated:Patient Re-evaluated prior to induction Oxygen Delivery Method: Circle system utilized Preoxygenation: Pre-oxygenation with 100% oxygen Induction Type: IV induction Ventilation: Mask ventilation without difficulty Laryngoscope Size: Mac, 3 and McGraph Grade View: Grade I Tube type: Oral Tube size: 7.0 mm Number of attempts: 1 Airway Equipment and Method: Stylet Placement Confirmation: ETT inserted through vocal cords under direct vision,  positive ETCO2 and breath sounds checked- equal and bilateral Secured at: 21 cm Tube secured with: Tape Dental Injury: Teeth and Oropharynx as per pre-operative assessment

## 2018-01-08 NOTE — Anesthesia Preprocedure Evaluation (Signed)
Anesthesia Evaluation  Patient identified by MRN, date of birth, ID band Patient awake    Reviewed: Allergy & Precautions, H&P , NPO status , Patient's Chart, lab work & pertinent test results  History of Anesthesia Complications Negative for: history of anesthetic complications  Airway Mallampati: III  TM Distance: >3 FB Neck ROM: full   Comment: Large neck Dental  (+) Teeth Intact   Pulmonary asthma , COPD,  COPD inhaler,    breath sounds clear to auscultation       Cardiovascular hypertension, (-) angina(-) Past MI, (-) Cardiac Stents, (-) CABG and (-) CHF (-) dysrhythmias  Rhythm:regular Rate:Normal     Neuro/Psych  Headaches, PSYCHIATRIC DISORDERS Anxiety Depression  Neuromuscular disease (cerebral palsy) negative neurological ROS  negative psych ROS   GI/Hepatic negative GI ROS, Neg liver ROS, GERD  Controlled and Medicated,  Endo/Other  negative endocrine ROSdiabetes  Renal/GU Renal disease     Musculoskeletal  (+) Arthritis ,   Abdominal   Peds  Hematology negative hematology ROS (+)   Anesthesia Other Findings Past Medical History: No date: Anxiety No date: Arthritis No date: Asthma No date: Cerebral palsy (HCC) No date: Complication of anesthesia     Comment:  panic attacks before surgery No date: COPD (chronic obstructive pulmonary disease) (HCC) No date: Depression No date: Diabetes mellitus without complication (HCC) No date: GERD (gastroesophageal reflux disease) No date: Hypertension  Past Surgical History: 1995: CESAREAN SECTION No date: CHOLECYSTECTOMY No date: FOOT CAPSULE RELEASE W/ PERCUTANEOUS HEEL CORD LENGTHENING,  TIBIAL TENDON TRANSFER     Comment:  age 47 ,and 2010-both legs No date: JOINT REPLACEMENT; Right     Comment:  both knees partial replacements dates unknown No date: knee replacment     Comment:  x 5 per pt. last one- left knee 2014. has had 2 on R 3               on  L  BMI    Body Mass Index:  40.97 kg/m      Reproductive/Obstetrics negative OB ROS                             Anesthesia Physical Anesthesia Plan  ASA: III  Anesthesia Plan: General ETT   Post-op Pain Management:    Induction:   PONV Risk Score and Plan: 3 and Ondansetron and Dexamethasone  Airway Management Planned:   Additional Equipment:   Intra-op Plan:   Post-operative Plan:   Informed Consent: I have reviewed the patients History and Physical, chart, labs and discussed the procedure including the risks, benefits and alternatives for the proposed anesthesia with the patient or authorized representative who has indicated his/her understanding and acceptance.   Dental Advisory Given  Plan Discussed with: Anesthesiologist, CRNA and Surgeon  Anesthesia Plan Comments:         Anesthesia Quick Evaluation

## 2018-01-08 NOTE — Progress Notes (Signed)
Called Dr. Ola Spurr about patient sating on room air between 90-92, stated she was fine with patient going home at the level. Updated on BP as well, last BP was 108/58.

## 2018-01-08 NOTE — H&P (Signed)
History and physical reviewed and will be scanned in later. No change in medical status reported by the patient or family, appears stable for surgery. All questions regarding the procedure answered, and patient (or family if a child) expressed understanding of the procedure. Patient reported that her current chronic pain meds were not working well. I explained that this issue was between her and her pain management physician. I can provide short term pain management with Percocet, but that will be a limited quantity, and if she has more extended needs, that will be deferred to her pain doctor. Tympanoplasty typically involves short term pain control for a few days.  Traci Davis @TODAY @

## 2018-01-08 NOTE — Anesthesia Post-op Follow-up Note (Signed)
Anesthesia QCDR form completed.        

## 2018-01-09 NOTE — Anesthesia Postprocedure Evaluation (Signed)
Anesthesia Post Note  Patient: Traci Davis  Procedure(s) Performed: TYMPANOPLASTY WITH POSSIBLE OSSICULAR CHAIN RECONSTRUCTION (Right )  Patient location during evaluation: PACU Anesthesia Type: General Level of consciousness: awake and alert Pain management: pain level controlled Vital Signs Assessment: post-procedure vital signs reviewed and stable Respiratory status: spontaneous breathing, nonlabored ventilation, respiratory function stable and patient connected to nasal cannula oxygen Cardiovascular status: blood pressure returned to baseline and stable Postop Assessment: no apparent nausea or vomiting Anesthetic complications: no     Last Vitals:  Vitals:   01/08/18 1128 01/08/18 1209  BP: 102/60 102/65  Pulse: 96 94  Resp: 16 18  Temp: (!) 36.3 C   SpO2: 93% 97%    Last Pain:  Vitals:   01/08/18 1128  TempSrc: Temporal  PainSc: [place]

## 2018-01-28 ENCOUNTER — Other Ambulatory Visit: Payer: Self-pay | Admitting: Family Medicine

## 2018-01-28 DIAGNOSIS — N63 Unspecified lump in unspecified breast: Secondary | ICD-10-CM

## 2018-02-04 DIAGNOSIS — M7540 Impingement syndrome of unspecified shoulder: Secondary | ICD-10-CM | POA: Insufficient documentation

## 2018-02-07 ENCOUNTER — Ambulatory Visit
Admission: RE | Admit: 2018-02-07 | Discharge: 2018-02-07 | Disposition: A | Discharge status: Home / Self Care | Payer: Medicare Other | Source: Ambulatory Visit | Attending: Family Medicine | Admitting: Family Medicine

## 2018-02-07 DIAGNOSIS — N6323 Unspecified lump in the left breast, lower outer quadrant: Secondary | ICD-10-CM | POA: Diagnosis not present

## 2018-02-07 DIAGNOSIS — N6321 Unspecified lump in the left breast, upper outer quadrant: Secondary | ICD-10-CM | POA: Insufficient documentation

## 2018-02-07 DIAGNOSIS — N63 Unspecified lump in unspecified breast: Secondary | ICD-10-CM

## 2018-02-17 ENCOUNTER — Other Ambulatory Visit (HOSPITAL_COMMUNITY)
Admission: RE | Admit: 2018-02-17 | Discharge: 2018-02-17 | Disposition: A | Discharge status: Home / Self Care | Payer: Medicare Other | Source: Ambulatory Visit | Attending: Obstetrics and Gynecology | Admitting: Obstetrics and Gynecology

## 2018-02-17 ENCOUNTER — Encounter: Payer: Self-pay | Admitting: Obstetrics and Gynecology

## 2018-02-17 ENCOUNTER — Ambulatory Visit (INDEPENDENT_AMBULATORY_CARE_PROVIDER_SITE_OTHER): Payer: Medicare Other | Admitting: Obstetrics and Gynecology

## 2018-02-17 VITALS — BP 128/84 | Ht 62.0 in | Wt 225.0 lb

## 2018-02-17 DIAGNOSIS — N950 Postmenopausal bleeding: Secondary | ICD-10-CM

## 2018-02-17 NOTE — Progress Notes (Signed)
Obstetrics & Gynecology Office Visit   Chief Complaint  Patient presents with  . Follow-up    ER Follow Up  . Menstrual Problem    History of Present Illness: 47 y.o. female who is seen in follow up from an ER visit at [facility] yesterday.  She presented to [facility] yesterday for abdominal pain and vaginal bleeding.  The pain is located in her left lower quadrant and left back.  Her right back also hurts.  She has also had bladder infections for the past 6 weeks.  She is taking levoquin.  She is seeing Threasa Alpha, NP, at [facility].  The vaginal bleeding started about 3 days ago.  The last time she had any bleeding was about 3 years ago.  She states the bleeding is not heavy, she sees pink spots in the toilet.  It seems to be getting worse.  She went through early menopause about 3 years ago.  She has had mild period cramps.  She had a negative pregnancy test yesterday.  She is G1P1001, s/p c-section due to the baby being large.  Her medical history is notable for cerebral palsy.    Past Medical History:  Diagnosis Date  . Anxiety   . Arthritis   . Asthma   . Cerebral palsy ([place])   . Complication of anesthesia    panic attacks before surgery  . COPD (chronic obstructive pulmonary disease) ([place])   . Depression   . Diabetes mellitus without complication ([place])   . GERD (gastroesophageal reflux disease)   . Hypertension     Past Surgical History:  Procedure Laterality Date  . CESAREAN SECTION  1995  . CHOLECYSTECTOMY    . FOOT CAPSULE RELEASE W/ PERCUTANEOUS HEEL CORD LENGTHENING, TIBIAL TENDON TRANSFER     age 68 ,and 2010-both legs  . JOINT REPLACEMENT Right    both knees partial replacements dates unknown  . knee replacment     x 5 per pt. last one- left knee 2014. has had 2 on R 3 on L  . TYMPANOPLASTY WITH GRAFT Right 01/08/2018   Procedure: TYMPANOPLASTY WITH POSSIBLE OSSICULAR CHAIN RECONSTRUCTION;  Surgeon: [name], MD;  Location: [facility] ORS;  Service: ENT;   Laterality: Right;   Gynecologic History: Patient's last menstrual period was 08/20/2014.  Obstetric History: G1P1001, s/p c-section  Family History  Problem Relation Age of Onset  . CAD Father   . Heart attack Brother   . Sexual abuse Paternal Grandfather   . Breast cancer Neg Hx     Social History   Socioeconomic History  . Marital status: Single    Spouse name: Not on file  . Number of children: Not on file  . Years of education: Not on file  . Highest education level: Not on file  Occupational History  . Not on file  Social Needs  . Financial resource strain: Not on file  . Food insecurity:    Worry: Not on file    Inability: Not on file  . Transportation needs:    Medical: Not on file    Non-medical: Not on file  Tobacco Use  . Smoking status: Never Smoker  . Smokeless tobacco: Never Used  Substance and Sexual Activity  . Alcohol use: No  . Drug use: No  . Sexual activity: Yes  Lifestyle  . Physical activity:    Days per week: Not on file    Minutes per session: Not on file  . Stress: Not on file  Relationships  . Social connections:    Talks on phone: Not on file    Gets together: Not on file    Attends religious service: Not on file    Active member of club or organization: Not on file    Attends meetings of clubs or organizations: Not on file    Relationship status: Not on file  . Intimate partner violence:    Fear of current or ex partner: Not on file    Emotionally abused: Not on file    Physically abused: Not on file    Forced sexual activity: Not on file  Other Topics Concern  . Not on file  Social History Narrative  . Not on file    Allergies  Allergen Reactions  . Meloxicam Other (See Comments)    Damage kidney  . Morphine And Related Other (See Comments)    hallucinations  . Vantin [Cefpodoxime] Nausea And Vomiting  . Latex Itching  . Baclofen Other (See Comments)    "makes cerebral palsy do adverse reaction on me"  .  Ciprofloxacin Itching and Nausea And Vomiting  . Tape Rash    skin tears.  Paper tape is ok    Prior to Admission medications   Medication Sig Start Date End Date Taking? Authorizing Provider  albuterol (PROVENTIL HFA;VENTOLIN HFA) 108 (90 BASE) MCG/ACT inhaler Inhale 2 puffs into the lungs 4 (four) times daily as needed. For shortness of breath and/or wheezing 09/22/14   [provider]  ARIPiprazole (ABILIFY) 5 MG tablet Take 1 tablet (5 mg total) by mouth at bedtime. 11/01/17   Ursula Alert, MD  Blood Glucose Monitoring Suppl (GLUCOCOM BLOOD GLUCOSE MONITOR) DEVI Test daily before all meals/snacks and once before bedtime. 09/07/14   [provider]  dicyclomine (BENTYL) 10 MG capsule Take 1 capsule (10 mg total) by mouth 3 (three) times daily as needed for up to 14 days for spasms. 07/14/17 10/08/17  Merlyn Lot, MD  donepezil (ARICEPT) 10 MG tablet Take 10 mg by mouth at bedtime.  05/21/17   [provider]  FLUoxetine (PROZAC) 40 MG capsule Take 1 capsule (40 mg total) by mouth daily. 11/01/17   Ursula Alert, MD  furosemide (LASIX) 40 MG tablet Take 40 mg by mouth daily as needed for fluid.     [provider]  glimepiride (AMARYL) 4 MG tablet Take 4 mg by mouth daily with breakfast.    [provider]  glucose blood (ONE TOUCH ULTRA TEST) test strip USE TO TEST BLOOD SUGAR TWO TIMES A DAY 12/07/14   [provider]  hydrOXYzine (VISTARIL) 25 MG capsule Take 1 capsule (25 mg total) by mouth 3 (three) times daily as needed for anxiety. Only for severe anxiety attacks 09/12/17   Ursula Alert, MD  ibuprofen (ADVIL,MOTRIN) 200 MG tablet Take 600 mg by mouth daily as needed for moderate pain.    [provider]  incobotulinumtoxinA (XEOMIN) 100 units SOLR injection Inject 100 Units into the muscle every 3 (three) months.  03/29/15   [provider]  Lancets (FREESTYLE) lancets Test daily before all meals/snacks 09/07/14    [provider]  lisinopril (PRINIVIL,ZESTRIL) 20 MG tablet Take 20 mg by mouth daily.    [provider]  nystatin (MYCOSTATIN/NYSTOP) powder 1 application daily 97/35/32   [provider]  ofloxacin (FLOXIN) 0.3 % OTIC solution Place 4 drops into the right ear daily.  09/11/17   [provider]  ofloxacin (OCUFLOX) 0.3 % ophthalmic solution  Place 4 drops into the right ear 2 (two) times daily. 01/08/18   [name], MD  omeprazole (PRILOSEC) 40 MG capsule Take 40 mg by mouth daily.     [provider]  oxyCODONE-acetaminophen (PERCOCET/ROXICET) 5-325 MG tablet Take 1-2 tablets by mouth every 6 (six) hours as needed for severe pain. 01/08/18   [name], MD  pioglitazone (ACTOS) 45 MG tablet Take 45 mg by mouth daily.    [provider]  polyethylene glycol (MIRALAX / GLYCOLAX) packet Take 17 gm mixed in liqiud as needed for constipation 09/28/15   [provider]  pregabalin (LYRICA) 50 MG capsule Take 100 mg by mouth once daily at night 05/09/17   [provider]  promethazine (PHENERGAN) 12.5 MG tablet Take 1 tablet (12.5 mg total) by mouth every 6 (six) hours as needed for nausea or vomiting. 07/14/17   Merlyn Lot, MD  ramelteon (ROZEREM) 8 MG tablet Take 1 tablet (8 mg total) by mouth at bedtime. 11/01/17   Ursula Alert, MD  solifenacin (VESICARE) 10 MG tablet Take 10 mg by mouth daily.    [provider]  Tiotropium Bromide Monohydrate (SPIRIVA RESPIMAT) 1.25 MCG/ACT AERS Inhale 1 puff into the lungs daily.    [provider]  tiZANidine (ZANAFLEX) 4 MG tablet Take 4 mg by mouth 3 (three) times daily as needed for muscle spasms.  06/24/17   [provider]  topiramate (TOPAMAX) 100 MG tablet Take 100 mg by mouth 2 (two) times daily.  07/17/16   [provider]    Review of Systems  Constitutional: Negative.   HENT: Negative.   Eyes: Negative.   Respiratory: Negative.     Cardiovascular: Negative.   Gastrointestinal: Positive for abdominal pain and nausea. Negative for blood in stool, constipation, diarrhea, heartburn, melena and vomiting.  Genitourinary: Negative.   Musculoskeletal: Negative.   Skin: Negative.   Neurological: Negative.   Psychiatric/Behavioral: Negative.      Physical Exam BP 128/84   Ht 5\' 2"  (1.575 m)   Wt 225 lb (102.1 kg)   LMP 08/20/2014   BMI 41.15 kg/m  Patient's last menstrual period was 08/20/2014. Physical Exam  Constitutional: She is oriented to person, place, and time. She appears well-developed. No distress.  Genitourinary: Vagina normal. Pelvic exam was performed with patient supine. There is no rash, tenderness, lesion or injury on the right labia.  There is Bartholin's cyst (3 cm, non-tender) on the left labia. There is no rash, tenderness, lesion or injury on the left labia.  Genitourinary Comments: Exam limited by patient's habitus and tolerance of exam. Some limited visualization of cervix. Narrow-caliber speculum was more tolerable to her. However, her vaginal sidewalls obscured visualization. A wider-caliber speculum was used, but the patient did not tolerate this well and the cervix was only marginally seen.  No obvious lesions noted. Blood noted to be scant and dark in color. Due to limited visualization, I was unable to perform an endometrial biopsy. An attempt at obtaining a pap smear was made.  Also, her limited mobility limited the exam.  Her body habitus greatly limited the bimanual exam.   HENT:  Head: Normocephalic and atraumatic.  Eyes: Conjunctivae are normal. No scleral icterus.  Cardiovascular: Normal rate and regular rhythm.  Pulmonary/Chest: Effort normal. No respiratory distress. She has wheezes (mild expiratory only noted on anterior lung fields).  Abdominal: Soft. Bowel sounds are normal. She exhibits no distension and no mass. There is tenderness (mild left flank). There is no  rebound and no  guarding.  Exam limited by body habitus  Musculoskeletal:  Limited range of motion and some weakness  Neurological: She is alert and oriented to person, place, and time. No cranial nerve deficit.  Skin: Skin is warm and dry. No erythema.  Psychiatric: She has a normal mood and affect. Her behavior is normal. Judgment normal.    Female chaperone present for pelvic and breast  portions of the physical exam  Assessment: 46 y.o. No obstetric history on file. female here for  1. Postmenopausal bleeding      Plan: Problem List Items Addressed This Visit    None    Visit Diagnoses    Postmenopausal bleeding    -  Primary   Relevant Orders   US PELVIS TRANSVANGINAL NON-OB (TV ONLY)   Cytology - PAP     I discussed my concern with her regarding menopausal bleeding.  She has not had a recent Pap smear.  Her last one was about 7 years ago and was abnormal.  Her colposcopy records following that Pap smear indicated a normal colposcopy.  However, she has had no follow-up Pap smear since that time.  Discussed my findings today and the limitations of my exam given her intolerance to the exam and limited visualization of her cervix.  Discussed plan for further work-up, which includes a pelvic ultrasound.  If the pelvic ultrasound is not able to rule out cancer of the uterus (endometrial stripe less than 4 mm), it may be necessary to perform an exam under anesthesia to obtain a biopsy of her uterine lining.  She voiced understanding and agreement with that plan.  30 minutes spent in face to face discussion with > 50% spent in counseling,management, and coordination of care of her postmenopausal bleeding.  Prentice Docker, MD 02/17/2018 5:32 PM

## 2018-02-18 ENCOUNTER — Ambulatory Visit (INDEPENDENT_AMBULATORY_CARE_PROVIDER_SITE_OTHER): Payer: Medicare Other | Admitting: Obstetrics and Gynecology

## 2018-02-18 ENCOUNTER — Ambulatory Visit (INDEPENDENT_AMBULATORY_CARE_PROVIDER_SITE_OTHER): Payer: Medicare Other

## 2018-02-18 ENCOUNTER — Encounter: Payer: Self-pay | Admitting: Obstetrics and Gynecology

## 2018-02-18 DIAGNOSIS — N950 Postmenopausal bleeding: Secondary | ICD-10-CM | POA: Diagnosis not present

## 2018-02-18 NOTE — H&P (View-Only) (Signed)
Gynecology Ultrasound Follow Up   Chief Complaint  Patient presents with  . Follow-up  Post-menopausal bleeding  History of Present Illness: Patient is a 47 y.o. female who presents today for ultrasound evaluation of the above .  Ultrasound demonstrates the following findings Adnexa: no masses seen  Uterus: retroverted with endometrial stripe  8.8 mm Additional: no other abnormalities noted.  She has continued to bleed since yesterday.    Past Medical History:  Diagnosis Date  . Anxiety   . Arthritis   . Asthma   . Cerebral palsy ([place])   . Complication of anesthesia    panic attacks before surgery  . COPD (chronic obstructive pulmonary disease) ([place])   . Depression   . Diabetes mellitus without complication ([place])   . GERD (gastroesophageal reflux disease)   . Hypertension     Past Surgical History:  Procedure Laterality Date  . CESAREAN SECTION  1995  . CHOLECYSTECTOMY    . FOOT CAPSULE RELEASE W/ PERCUTANEOUS HEEL CORD LENGTHENING, TIBIAL TENDON TRANSFER     age 34 ,and 2010-both legs  . JOINT REPLACEMENT Right    both knees partial replacements dates unknown  . knee replacment     x 5 per pt. last one- left knee 2014. has had 2 on R 3 on L  . TYMPANOPLASTY WITH GRAFT Right 01/08/2018   Procedure: TYMPANOPLASTY WITH POSSIBLE OSSICULAR CHAIN RECONSTRUCTION;  Surgeon: [name], MD;  Location: [facility] ORS;  Service: ENT;  Laterality: Right;    Family History  Problem Relation Age of Onset  . CAD Father   . Heart attack Brother   . Sexual abuse Paternal Grandfather   . Breast cancer Neg Hx     Social History   Socioeconomic History  . Marital status: Single    Spouse name: Not on file  . Number of children: Not on file  . Years of education: Not on file  . Highest education level: Not on file  Occupational History  . Not on file  Social Needs  . Financial resource strain: Not on file  . Food insecurity:    Worry: Not on file    Inability: Not on  file  . Transportation needs:    Medical: Not on file    Non-medical: Not on file  Tobacco Use  . Smoking status: Never Smoker  . Smokeless tobacco: Never Used  Substance and Sexual Activity  . Alcohol use: No  . Drug use: No  . Sexual activity: Yes  Lifestyle  . Physical activity:    Days per week: Not on file    Minutes per session: Not on file  . Stress: Not on file  Relationships  . Social connections:    Talks on phone: Not on file    Gets together: Not on file    Attends religious service: Not on file    Active member of club or organization: Not on file    Attends meetings of clubs or organizations: Not on file    Relationship status: Not on file  . Intimate partner violence:    Fear of current or ex partner: Not on file    Emotionally abused: Not on file    Physically abused: Not on file    Forced sexual activity: Not on file  Other Topics Concern  . Not on file  Social History Narrative  . Not on file    Allergies  Allergen Reactions  . Meloxicam Other (See Comments)    Damage  kidney  . Morphine And Related Other (See Comments)    hallucinations  . Vantin [Cefpodoxime] Nausea And Vomiting  . Latex Itching  . Baclofen Other (See Comments)    "makes cerebral palsy do adverse reaction on me"  . Ciprofloxacin Itching and Nausea And Vomiting  . Tape Rash    skin tears.  Paper tape is ok    Prior to Admission medications   Medication Sig Start Date End Date Taking? Authorizing Provider  albuterol (PROVENTIL HFA;VENTOLIN HFA) 108 (90 BASE) MCG/ACT inhaler Inhale 2 puffs into the lungs 4 (four) times daily as needed. For shortness of breath and/or wheezing 09/22/14   [provider]  ARIPiprazole (ABILIFY) 5 MG tablet Take 1 tablet (5 mg total) by mouth at bedtime. 11/01/17   Ursula Alert, MD  Blood Glucose Monitoring Suppl (GLUCOCOM BLOOD GLUCOSE MONITOR) DEVI Test daily before all meals/snacks and once before bedtime. 09/07/14   [provider]  dicyclomine (BENTYL) 10 MG capsule Take 1 capsule (10 mg total) by mouth 3 (three) times daily as needed for up to 14 days for spasms. 07/14/17 10/08/17  Merlyn Lot, MD  donepezil (ARICEPT) 10 MG tablet Take 10 mg by mouth at bedtime.  05/21/17   [provider]  FLUoxetine (PROZAC) 40 MG capsule Take 1 capsule (40 mg total) by mouth daily. 11/01/17   Ursula Alert, MD  furosemide (LASIX) 40 MG tablet Take 40 mg by mouth daily as needed for fluid.     [provider]  glimepiride (AMARYL) 4 MG tablet Take 4 mg by mouth daily with breakfast.    [provider]  glucose blood (ONE TOUCH ULTRA TEST) test strip USE TO TEST BLOOD SUGAR TWO TIMES A DAY 12/07/14   [provider]  hydrOXYzine (VISTARIL) 25 MG capsule Take 1 capsule (25 mg total) by mouth 3 (three) times daily as needed for anxiety. Only for severe anxiety attacks 09/12/17   Ursula Alert, MD  ibuprofen (ADVIL,MOTRIN) 200 MG tablet Take 600 mg by mouth daily as needed for moderate pain.    [provider]  incobotulinumtoxinA (XEOMIN) 100 units SOLR injection Inject 100 Units into the muscle every 3 (three) months.  03/29/15   [provider]  Lancets (FREESTYLE) lancets Test daily before all meals/snacks 09/07/14   [provider]  lisinopril (PRINIVIL,ZESTRIL) 20 MG tablet Take 20 mg by mouth daily.    [provider]  nystatin (MYCOSTATIN/NYSTOP) powder 1 application daily 41/32/44   [provider]  ofloxacin (FLOXIN) 0.3 % OTIC solution Place 4 drops into the right ear daily.  09/11/17   [provider]  ofloxacin (OCUFLOX) 0.3 % ophthalmic solution Place 4 drops into the right ear 2 (two) times daily. 01/08/18   [name], MD  omeprazole (PRILOSEC) 40 MG capsule Take 40 mg by mouth daily.     [provider]  oxyCODONE-acetaminophen (PERCOCET/ROXICET) 5-325 MG tablet Take 1-2 tablets by mouth every 6 (six) hours as needed for  severe pain. 01/08/18   [name], MD  pioglitazone (ACTOS) 45 MG tablet Take 45 mg by mouth daily.    [provider]  polyethylene glycol (MIRALAX / GLYCOLAX) packet Take 17 gm mixed in liqiud as needed for constipation 09/28/15   [provider]  pregabalin (LYRICA) 50 MG capsule Take 100 mg by mouth once daily at night 05/09/17   [provider]  promethazine (PHENERGAN) 12.5 MG tablet Take 1 tablet (12.5 mg total) by mouth every 6 (  six) hours as needed for nausea or vomiting. 07/14/17   Merlyn Lot, MD  ramelteon (ROZEREM) 8 MG tablet Take 1 tablet (8 mg total) by mouth at bedtime. 11/01/17   Ursula Alert, MD  solifenacin (VESICARE) 10 MG tablet Take 10 mg by mouth daily.    [provider]  Tiotropium Bromide Monohydrate (SPIRIVA RESPIMAT) 1.25 MCG/ACT AERS Inhale 1 puff into the lungs daily.    [provider]  tiZANidine (ZANAFLEX) 4 MG tablet Take 4 mg by mouth 3 (three) times daily as needed for muscle spasms.  06/24/17   [provider]  topiramate (TOPAMAX) 100 MG tablet Take 100 mg by mouth 2 (two) times daily.  07/17/16   [provider]    Physical Exam BP 126/82   Ht 5\' 2"  (1.575 m)   Wt 225 lb (102.1 kg)   LMP 08/20/2014   BMI 41.15 kg/m    General: NAD HEENT: normocephalic, anicteric Pulmonary: No increased work of breathing Extremities: no edema, erythema, or tenderness Neurologic: Grossly intact, normal gait Psychiatric: mood appropriate, affect full  Imaging Reports: US Pelvis Transvanginal Non-ob (tv Only)  Result Date: 02/18/2018 Patient Name: KHIA DIETERICH DOB: 1970-05-22 MRN: [id number] ULTRASOUND REPORT Location: [facility] Date of Service: 02/18/2018 Indications: postmenopausal bleeding Findings: The uterus is anteverted and measures 6.1 x 3.5 x 3.2cm. Echo texture is homogenous without evidence of focal masses. The Endometrium is thickened and heterogeneous measuring 8.8 mm. Bilateral  ovaries are not seen. Survey of the adnexa demonstrates no adnexal masses. There is no free fluid in the cul de sac. Impression: 1. Thickened, heterogeneous endometrium measuring 8.47mm Vita Barley, RDMS RVT The ultrasound images and findings were reviewed by me and I agree with the above report. Prentice Docker, MD, [facility], [facility] 02/18/2018 4:52 PM      Assessment: 47 y.o. female here for   1. Postmenopausal bleeding      Plan: Problem List Items Addressed This Visit      Other   Postmenopausal bleeding     Discussed findings on ultrasound.  Given her endometrial stripe on ultrasound, atrophic endometrium is unlikely.  Also, given the fact that she has not had a period in over 3 years and has an endometrial stripe of only 8.8 mm, she appears to be menopausal with bleeding.  Discussed options from this point forward.  I strongly recommend endometrial sampling which could be accomplished with another office attempt, which was unsuccessful yesterday but if successful would save her trip to the operating room.  The other option would be to go to the operating room for a hysteroscopy, dilation and curettage.  She adamantly declines another attempt in the office in favor of an operating room attempt.  Given her multiple comorbid conditions, anesthesia will likely require surgical clearance.  I have asked her to obtain this as soon as possible.  I will try to get her into the operating room this week for sampling.  However, if not, I discussed with them that it would have to be in 2 weeks.  Consent forms were signed today and all questions were answered and she agrees to the recommended plan of action.  20 minutes spent in face to face discussion with > 50% spent in counseling,management, and coordination of care of her postmenopausal bleeding.  Prentice Docker, MD, [facility], [facility] 02/18/2018 5:32 PM

## 2018-02-18 NOTE — Progress Notes (Signed)
Gynecology Ultrasound Follow Up   Chief Complaint  Patient presents with  . Follow-up  Post-menopausal bleeding  History of Present Illness: Patient is a 47 y.o. female who presents today for ultrasound evaluation of the above .  Ultrasound demonstrates the following findings Adnexa: no masses seen  Uterus: retroverted with endometrial stripe  8.8 mm Additional: no other abnormalities noted.  She has continued to bleed since yesterday.    Past Medical History:  Diagnosis Date  . Anxiety   . Arthritis   . Asthma   . Cerebral palsy ([place])   . Complication of anesthesia    panic attacks before surgery  . COPD (chronic obstructive pulmonary disease) ([place])   . Depression   . Diabetes mellitus without complication ([place])   . GERD (gastroesophageal reflux disease)   . Hypertension     Past Surgical History:  Procedure Laterality Date  . CESAREAN SECTION  1995  . CHOLECYSTECTOMY    . FOOT CAPSULE RELEASE W/ PERCUTANEOUS HEEL CORD LENGTHENING, TIBIAL TENDON TRANSFER     age 64 ,and 2010-both legs  . JOINT REPLACEMENT Right    both knees partial replacements dates unknown  . knee replacment     x 5 per pt. last one- left knee 2014. has had 2 on R 3 on L  . TYMPANOPLASTY WITH GRAFT Right 01/08/2018   Procedure: TYMPANOPLASTY WITH POSSIBLE OSSICULAR CHAIN RECONSTRUCTION;  Surgeon: [name], MD;  Location: [facility] ORS;  Service: ENT;  Laterality: Right;    Family History  Problem Relation Age of Onset  . CAD Father   . Heart attack Brother   . Sexual abuse Paternal Grandfather   . Breast cancer Neg Hx     Social History   Socioeconomic History  . Marital status: Single    Spouse name: Not on file  . Number of children: Not on file  . Years of education: Not on file  . Highest education level: Not on file  Occupational History  . Not on file  Social Needs  . Financial resource strain: Not on file  . Food insecurity:    Worry: Not on file    Inability: Not on  file  . Transportation needs:    Medical: Not on file    Non-medical: Not on file  Tobacco Use  . Smoking status: Never Smoker  . Smokeless tobacco: Never Used  Substance and Sexual Activity  . Alcohol use: No  . Drug use: No  . Sexual activity: Yes  Lifestyle  . Physical activity:    Days per week: Not on file    Minutes per session: Not on file  . Stress: Not on file  Relationships  . Social connections:    Talks on phone: Not on file    Gets together: Not on file    Attends religious service: Not on file    Active member of club or organization: Not on file    Attends meetings of clubs or organizations: Not on file    Relationship status: Not on file  . Intimate partner violence:    Fear of current or ex partner: Not on file    Emotionally abused: Not on file    Physically abused: Not on file    Forced sexual activity: Not on file  Other Topics Concern  . Not on file  Social History Narrative  . Not on file    Allergies  Allergen Reactions  . Meloxicam Other (See Comments)    Damage  kidney  . Morphine And Related Other (See Comments)    hallucinations  . Vantin [Cefpodoxime] Nausea And Vomiting  . Latex Itching  . Baclofen Other (See Comments)    "makes cerebral palsy do adverse reaction on me"  . Ciprofloxacin Itching and Nausea And Vomiting  . Tape Rash    skin tears.  Paper tape is ok    Prior to Admission medications   Medication Sig Start Date End Date Taking? Authorizing Provider  albuterol (PROVENTIL HFA;VENTOLIN HFA) 108 (90 BASE) MCG/ACT inhaler Inhale 2 puffs into the lungs 4 (four) times daily as needed. For shortness of breath and/or wheezing 09/22/14   [provider]  ARIPiprazole (ABILIFY) 5 MG tablet Take 1 tablet (5 mg total) by mouth at bedtime. 11/01/17   Ursula Alert, MD  Blood Glucose Monitoring Suppl (GLUCOCOM BLOOD GLUCOSE MONITOR) DEVI Test daily before all meals/snacks and once before bedtime. 09/07/14   [provider]  dicyclomine (BENTYL) 10 MG capsule Take 1 capsule (10 mg total) by mouth 3 (three) times daily as needed for up to 14 days for spasms. 07/14/17 10/08/17  Merlyn Lot, MD  donepezil (ARICEPT) 10 MG tablet Take 10 mg by mouth at bedtime.  05/21/17   [provider]  FLUoxetine (PROZAC) 40 MG capsule Take 1 capsule (40 mg total) by mouth daily. 11/01/17   Ursula Alert, MD  furosemide (LASIX) 40 MG tablet Take 40 mg by mouth daily as needed for fluid.     [provider]  glimepiride (AMARYL) 4 MG tablet Take 4 mg by mouth daily with breakfast.    [provider]  glucose blood (ONE TOUCH ULTRA TEST) test strip USE TO TEST BLOOD SUGAR TWO TIMES A DAY 12/07/14   [provider]  hydrOXYzine (VISTARIL) 25 MG capsule Take 1 capsule (25 mg total) by mouth 3 (three) times daily as needed for anxiety. Only for severe anxiety attacks 09/12/17   Ursula Alert, MD  ibuprofen (ADVIL,MOTRIN) 200 MG tablet Take 600 mg by mouth daily as needed for moderate pain.    [provider]  incobotulinumtoxinA (XEOMIN) 100 units SOLR injection Inject 100 Units into the muscle every 3 (three) months.  03/29/15   [provider]  Lancets (FREESTYLE) lancets Test daily before all meals/snacks 09/07/14   [provider]  lisinopril (PRINIVIL,ZESTRIL) 20 MG tablet Take 20 mg by mouth daily.    [provider]  nystatin (MYCOSTATIN/NYSTOP) powder 1 application daily 78/58/85   [provider]  ofloxacin (FLOXIN) 0.3 % OTIC solution Place 4 drops into the right ear daily.  09/11/17   [provider]  ofloxacin (OCUFLOX) 0.3 % ophthalmic solution Place 4 drops into the right ear 2 (two) times daily. 01/08/18   [name], MD  omeprazole (PRILOSEC) 40 MG capsule Take 40 mg by mouth daily.     [provider]  oxyCODONE-acetaminophen (PERCOCET/ROXICET) 5-325 MG tablet Take 1-2 tablets by mouth every 6 (six) hours as needed for  severe pain. 01/08/18   [name], MD  pioglitazone (ACTOS) 45 MG tablet Take 45 mg by mouth daily.    [provider]  polyethylene glycol (MIRALAX / GLYCOLAX) packet Take 17 gm mixed in liqiud as needed for constipation 09/28/15   [provider]  pregabalin (LYRICA) 50 MG capsule Take 100 mg by mouth once daily at night 05/09/17   [provider]  promethazine (PHENERGAN) 12.5 MG tablet Take 1 tablet (12.5 mg total) by mouth every 6 (  six) hours as needed for nausea or vomiting. 07/14/17   Merlyn Lot, MD  ramelteon (ROZEREM) 8 MG tablet Take 1 tablet (8 mg total) by mouth at bedtime. 11/01/17   Ursula Alert, MD  solifenacin (VESICARE) 10 MG tablet Take 10 mg by mouth daily.    [provider]  Tiotropium Bromide Monohydrate (SPIRIVA RESPIMAT) 1.25 MCG/ACT AERS Inhale 1 puff into the lungs daily.    [provider]  tiZANidine (ZANAFLEX) 4 MG tablet Take 4 mg by mouth 3 (three) times daily as needed for muscle spasms.  06/24/17   [provider]  topiramate (TOPAMAX) 100 MG tablet Take 100 mg by mouth 2 (two) times daily.  07/17/16   [provider]    Physical Exam BP 126/82   Ht 5\' 2"  (1.575 m)   Wt 225 lb (102.1 kg)   LMP 08/20/2014   BMI 41.15 kg/m    General: NAD HEENT: normocephalic, anicteric Pulmonary: No increased work of breathing Extremities: no edema, erythema, or tenderness Neurologic: Grossly intact, normal gait Psychiatric: mood appropriate, affect full  Imaging Reports: US Pelvis Transvanginal Non-ob (tv Only)  Result Date: 02/18/2018 Patient Name: WRENLY LAURITSEN DOB: [birth year] MRN: [id number] ULTRASOUND REPORT Location: [facility] Date of Service: 02/18/2018 Indications: postmenopausal bleeding Findings: The uterus is anteverted and measures 6.1 x 3.5 x 3.2cm. Echo texture is homogenous without evidence of focal masses. The Endometrium is thickened and heterogeneous measuring 8.8 mm. Bilateral  ovaries are not seen. Survey of the adnexa demonstrates no adnexal masses. There is no free fluid in the cul de sac. Impression: 1. Thickened, heterogeneous endometrium measuring 8.37mm Vita Barley, RDMS RVT The ultrasound images and findings were reviewed by me and I agree with the above report. Prentice Docker, MD, [facility], [facility] 02/18/2018 4:52 PM      Assessment: 47 y.o. female here for   1. Postmenopausal bleeding      Plan: Problem List Items Addressed This Visit      Other   Postmenopausal bleeding     Discussed findings on ultrasound.  Given her endometrial stripe on ultrasound, atrophic endometrium is unlikely.  Also, given the fact that she has not had a period in over 3 years and has an endometrial stripe of only 8.8 mm, she appears to be menopausal with bleeding.  Discussed options from this point forward.  I strongly recommend endometrial sampling which could be accomplished with another office attempt, which was unsuccessful yesterday but if successful would save her trip to the operating room.  The other option would be to go to the operating room for a hysteroscopy, dilation and curettage.  She adamantly declines another attempt in the office in favor of an operating room attempt.  Given her multiple comorbid conditions, anesthesia will likely require surgical clearance.  I have asked her to obtain this as soon as possible.  I will try to get her into the operating room this week for sampling.  However, if not, I discussed with them that it would have to be in 2 weeks.  Consent forms were signed today and all questions were answered and she agrees to the recommended plan of action.  20 minutes spent in face to face discussion with > 50% spent in counseling,management, and coordination of care of her postmenopausal bleeding.  Prentice Docker, MD, [facility], [facility] 02/18/2018 5:32 PM

## 2018-02-19 ENCOUNTER — Other Ambulatory Visit: Payer: Self-pay

## 2018-02-19 ENCOUNTER — Encounter
Admission: RE | Admit: 2018-02-19 | Discharge: 2018-02-19 | Disposition: A | Discharge status: Home / Self Care | Payer: Medicare Other | Source: Ambulatory Visit | Attending: Obstetrics and Gynecology | Admitting: Obstetrics and Gynecology

## 2018-02-19 NOTE — Patient Instructions (Signed)
Your procedure is scheduled on:02/20/18 Report to Day Surgery.[facility]OND FLOOR To find out your arrival time please call [phone] between 1PM - 3PM on 02/19/18  Remember: Instructions that are not followed completely may result in serious medical risk,  up to and including death, or upon the discretion of your surgeon and anesthesiologist your  surgery may need to be rescheduled.     _X__ 1. Do not eat food after midnight the night before your procedure.                 No gum chewing or hard candies. You may drink clear liquids up to 2 hours                 before you are scheduled to arrive for your surgery- DO not drink clear                 liquids within 2 hours of the start of your surgery.                 Clear Liquids include:  water, apple juice without pulp, clear carbohydrate                 drink such as Clearfast of Gatorade, Black Coffee or Tea (Do not add                 anything to coffee or tea).  __X__2.  On the morning of surgery brush your teeth with toothpaste and water, you                may rinse your mouth with mouthwash if you wish.  Do not swallow any toothpaste of mouthwash.     _X__ 3.  No Alcohol for 24 hours before or after surgery.   _X__ 4.  Do Not Smoke or use e-cigarettes For 24 Hours Prior to Your Surgery.                 Do not use any chewable tobacco products for at least 6 hours prior to                 surgery.  ____  5.  Bring all medications with you on the day of surgery if instructed.   __X__  6.  Notify your doctor if there is any change in your medical condition      (cold, fever, infections).     Do not wear jewelry, make-up, hairpins, clips or nail polish. Do not wear lotions, powders, or perfumes. You may wear deodorant. Do not shave 48 hours prior to surgery. Men may shave face and neck. Do not bring valuables to the hospital.    [facility] is not responsible for any belongings or  valuables.  Contacts, dentures or bridgework may not be worn into surgery. Leave your suitcase in the car. After surgery it may be brought to your room. For patients admitted to the hospital, discharge time is determined by your treatment team.   Patients discharged the day of surgery will not be allowed to drive hom  __X__ Take these medicines the morning of surgery with A SIP OF WATER:    1. CHLORZOXAZONE  2. HYDROXYZINE  3. FLUOXETINE  4.OMEPRAZOLE  5.TOPIRAMATE  6.SOLIFENACIN  ____ Fleet Enema (as directed)   ____ Use CHG Soap as directed   X___ Use inhalers on the day of surgery  AND BRING TO HOSPITAL  ____ Stop metformin 2 days prior to  surgery    ____ Take 1/2 of usual insulin dose the night before surgery. No insulin the morning          of surgery.   ____ Stop Coumadin/Plavix/aspirin on   ____ Stop Anti-inflammatories on  ____ Stop supplements until after surgery.    ____ Bring C-Pap to the hospital.

## 2018-02-20 ENCOUNTER — Ambulatory Visit
Admission: RE | Admit: 2018-02-20 | Discharge: 2018-02-20 | Disposition: A | Discharge status: Home / Self Care | Payer: Medicare Other | Source: Ambulatory Visit | Attending: Obstetrics and Gynecology | Admitting: Obstetrics and Gynecology

## 2018-02-20 ENCOUNTER — Other Ambulatory Visit: Payer: Self-pay

## 2018-02-20 ENCOUNTER — Ambulatory Visit: Payer: Medicare Other | Admitting: Anesthesiology

## 2018-02-20 ENCOUNTER — Encounter
Admission: RE | Disposition: A | Discharge status: Home / Self Care | Payer: Self-pay | Source: Ambulatory Visit | Attending: Obstetrics and Gynecology

## 2018-02-20 DIAGNOSIS — N840 Polyp of corpus uteri: Secondary | ICD-10-CM | POA: Diagnosis not present

## 2018-02-20 DIAGNOSIS — Z7984 Long term (current) use of oral hypoglycemic drugs: Secondary | ICD-10-CM | POA: Diagnosis not present

## 2018-02-20 DIAGNOSIS — F419 Anxiety disorder, unspecified: Secondary | ICD-10-CM | POA: Insufficient documentation

## 2018-02-20 DIAGNOSIS — Z79899 Other long term (current) drug therapy: Secondary | ICD-10-CM | POA: Diagnosis not present

## 2018-02-20 DIAGNOSIS — G809 Cerebral palsy, unspecified: Secondary | ICD-10-CM | POA: Insufficient documentation

## 2018-02-20 DIAGNOSIS — F329 Major depressive disorder, single episode, unspecified: Secondary | ICD-10-CM | POA: Insufficient documentation

## 2018-02-20 DIAGNOSIS — N950 Postmenopausal bleeding: Secondary | ICD-10-CM | POA: Diagnosis present

## 2018-02-20 DIAGNOSIS — I10 Essential (primary) hypertension: Secondary | ICD-10-CM | POA: Diagnosis not present

## 2018-02-20 DIAGNOSIS — Z791 Long term (current) use of non-steroidal anti-inflammatories (NSAID): Secondary | ICD-10-CM | POA: Insufficient documentation

## 2018-02-20 DIAGNOSIS — K219 Gastro-esophageal reflux disease without esophagitis: Secondary | ICD-10-CM | POA: Diagnosis not present

## 2018-02-20 DIAGNOSIS — E119 Type 2 diabetes mellitus without complications: Secondary | ICD-10-CM | POA: Insufficient documentation

## 2018-02-20 DIAGNOSIS — Z79891 Long term (current) use of opiate analgesic: Secondary | ICD-10-CM | POA: Diagnosis not present

## 2018-02-20 DIAGNOSIS — Z96653 Presence of artificial knee joint, bilateral: Secondary | ICD-10-CM | POA: Insufficient documentation

## 2018-02-20 DIAGNOSIS — J449 Chronic obstructive pulmonary disease, unspecified: Secondary | ICD-10-CM | POA: Diagnosis not present

## 2018-02-20 HISTORY — PX: DILATATION & CURETTAGE/HYSTEROSCOPY WITH MYOSURE: SHX6511

## 2018-02-20 LAB — GLUCOSE, CAPILLARY
GLUCOSE-CAPILLARY: 82 mg/dL (ref 70–99)
GLUCOSE-CAPILLARY: 83 mg/dL (ref 70–99)

## 2018-02-20 LAB — COMPREHENSIVE METABOLIC PANEL
ALBUMIN: 3.5 g/dL (ref 3.5–5.0)
ALT: 27 U/L (ref 0–44)
ANION GAP: 9 (ref 5–15)
AST: 17 U/L (ref 15–41)
Alkaline Phosphatase: 68 U/L (ref 38–126)
BILIRUBIN TOTAL: 0.7 mg/dL (ref 0.3–1.2)
BUN: 32 mg/dL — ABNORMAL HIGH (ref 6–20)
CO2: 23 mmol/L (ref 22–32)
Calcium: 8.9 mg/dL (ref 8.9–10.3)
Chloride: 106 mmol/L (ref 98–111)
Creatinine, Ser: 0.9 mg/dL (ref 0.44–1.00)
GFR calc Af Amer: >60 mL/min (ref >60)
GFR calc non Af Amer: >60 mL/min (ref >60)
GLUCOSE: 102 mg/dL — AB (ref 70–99)
POTASSIUM: 4.1 mmol/L (ref 3.5–5.1)
SODIUM: 138 mmol/L (ref 135–145)
TOTAL PROTEIN: 6.6 g/dL (ref 6.5–8.1)

## 2018-02-20 LAB — CBC
HCT: 44.4 % (ref 36.0–46.0)
Hemoglobin: 14 g/dL (ref 12.0–15.0)
MCH: 27.3 pg (ref 26.0–34.0)
MCHC: 31.5 g/dL (ref 30.0–36.0)
MCV: 86.7 fL (ref 80.0–100.0)
PLATELETS: 250 10*3/uL (ref 150–400)
RBC: 5.12 MIL/uL — ABNORMAL HIGH (ref 3.87–5.11)
RDW: 16.5 % — AB (ref 11.5–15.5)
WBC: 12.5 10*3/uL — AB (ref 4.0–10.5)
nRBC: 0 % (ref 0.0–0.2)

## 2018-02-20 LAB — TYPE AND SCREEN
ABO/RH(D): O POS
Antibody Screen: NEGATIVE

## 2018-02-20 LAB — ABO/RH: ABO/RH(D): O POS

## 2018-02-20 SURGERY — DILATATION & CURETTAGE/HYSTEROSCOPY WITH MYOSURE
Anesthesia: General

## 2018-02-20 MED ORDER — SODIUM CHLORIDE 0.9 % IV SOLN
INTRAVENOUS | Status: DC
  Administered 2018-02-20: 12:00:00 via INTRAVENOUS

## 2018-02-20 MED ORDER — FENTANYL CITRATE (PF) 100 MCG/2ML IJ SOLN: 25.0000 ug | INTRAMUSCULAR | Status: DC | PRN

## 2018-02-20 MED ORDER — MIDAZOLAM HCL 2 MG/2ML IJ SOLN
INTRAMUSCULAR | Status: AC
  Filled 2018-02-20: qty 2

## 2018-02-20 MED ORDER — DEXAMETHASONE SODIUM PHOSPHATE 10 MG/ML IJ SOLN
INTRAMUSCULAR | Status: DC | PRN
  Administered 2018-02-20: 6 mg via INTRAVENOUS

## 2018-02-20 MED ORDER — ONDANSETRON HCL 4 MG/2ML IJ SOLN
INTRAMUSCULAR | Status: DC | PRN
  Administered 2018-02-20: 4 mg via INTRAVENOUS

## 2018-02-20 MED ORDER — OXYCODONE HCL 5 MG PO TABS: 5.0000 mg | ORAL_TABLET | Freq: Once | ORAL | Status: DC | PRN

## 2018-02-20 MED ORDER — PROPOFOL 10 MG/ML IV BOLUS
INTRAVENOUS | Status: AC
  Filled 2018-02-20: qty 20

## 2018-02-20 MED ORDER — GLYCOPYRROLATE 0.2 MG/ML IJ SOLN
INTRAMUSCULAR | Status: DC | PRN
  Administered 2018-02-20: 0.2 mg via INTRAVENOUS

## 2018-02-20 MED ORDER — LACTATED RINGERS IV SOLN: INTRAVENOUS | Status: DC

## 2018-02-20 MED ORDER — FENTANYL CITRATE (PF) 100 MCG/2ML IJ SOLN
INTRAMUSCULAR | Status: DC | PRN
  Administered 2018-02-20 (×4): 25 ug via INTRAVENOUS

## 2018-02-20 MED ORDER — GLYCOPYRROLATE 0.2 MG/ML IJ SOLN
INTRAMUSCULAR | Status: AC
  Filled 2018-02-20: qty 1

## 2018-02-20 MED ORDER — SILVER NITRATE-POT NITRATE 75-25 % EX MISC
CUTANEOUS | Status: DC | PRN
  Administered 2018-02-20: 4 via TOPICAL

## 2018-02-20 MED ORDER — LIDOCAINE HCL (CARDIAC) PF 100 MG/5ML IV SOSY
PREFILLED_SYRINGE | INTRAVENOUS | Status: DC | PRN
  Administered 2018-02-20: 100 mg via INTRAVENOUS

## 2018-02-20 MED ORDER — OXYCODONE HCL 5 MG/5ML PO SOLN: 5.0000 mg | Freq: Once | ORAL | Status: DC | PRN

## 2018-02-20 MED ORDER — PROPOFOL 10 MG/ML IV BOLUS
INTRAVENOUS | Status: DC | PRN
  Administered 2018-02-20: 150 mg via INTRAVENOUS

## 2018-02-20 MED ORDER — MIDAZOLAM HCL 2 MG/2ML IJ SOLN
INTRAMUSCULAR | Status: DC | PRN
  Administered 2018-02-20: 2 mg via INTRAVENOUS

## 2018-02-20 MED ORDER — LIDOCAINE HCL (PF) 2 % IJ SOLN
INTRAMUSCULAR | Status: AC
  Filled 2018-02-20: qty 10

## 2018-02-20 MED ORDER — ONDANSETRON HCL 4 MG/2ML IJ SOLN
INTRAMUSCULAR | Status: AC
  Filled 2018-02-20: qty 2

## 2018-02-20 MED ORDER — FENTANYL CITRATE (PF) 100 MCG/2ML IJ SOLN
INTRAMUSCULAR | Status: AC
  Filled 2018-02-20: qty 2

## 2018-02-20 MED ORDER — DEXAMETHASONE SODIUM PHOSPHATE 10 MG/ML IJ SOLN
INTRAMUSCULAR | Status: AC
  Filled 2018-02-20: qty 1

## 2018-02-20 SURGICAL SUPPLY — 26 items
BAG URINE DRAINAGE (UROLOGICAL SUPPLIES) IMPLANT
CANISTER SUC SOCK COL 7IN (MISCELLANEOUS) IMPLANT
CATH FOLEY 2WAY  5CC 16FR (CATHETERS)
CATH FOLEY 2WAY 5CC 16FR (CATHETERS)
CATH ROBINSON RED A/P 16FR (CATHETERS) ×3 IMPLANT
CATH URTH 16FR FL 2W BLN LF (CATHETERS) IMPLANT
COVER WAND RF STERILE (DRAPES) ×3 IMPLANT
DEVICE MYOSURE LITE (MISCELLANEOUS) ×3 IMPLANT
DEVICE MYOSURE REACH (MISCELLANEOUS) IMPLANT
ELECT REM PT RETURN 9FT ADLT (ELECTROSURGICAL) ×3
ELECTRODE REM PT RTRN 9FT ADLT (ELECTROSURGICAL) ×1 IMPLANT
GLOVE BIO SURGEON STRL SZ7 (GLOVE) ×6 IMPLANT
GLOVE BIOGEL PI IND STRL 7.5 (GLOVE) ×2 IMPLANT
GLOVE BIOGEL PI INDICATOR 7.5 (GLOVE) ×4
GOWN STRL REUS W/ TWL LRG LVL3 (GOWN DISPOSABLE) ×2 IMPLANT
GOWN STRL REUS W/TWL LRG LVL3 (GOWN DISPOSABLE) ×6
IV LACTATED RINGER IRRG 3000ML (IV SOLUTION) ×2
IV LR IRRIG 3000ML ARTHROMATIC (IV SOLUTION) ×1 IMPLANT
KIT PROCEDURE FLUENT (KITS) ×3 IMPLANT
KIT TURNOVER CYSTO (KITS) ×3 IMPLANT
PACK DNC HYST (MISCELLANEOUS) ×3 IMPLANT
PAD OB MATERNITY 4.3X12.25 (PERSONAL CARE ITEMS) ×3 IMPLANT
PAD PREP 24X41 OB/GYN DISP (PERSONAL CARE ITEMS) ×3 IMPLANT
TUBING CONNECTING 10 (TUBING) ×2 IMPLANT
TUBING CONNECTING 10' (TUBING) ×1
TUBING HYSTEROSCOPY DOLPHIN (MISCELLANEOUS) ×3 IMPLANT

## 2018-02-20 NOTE — Interval H&P Note (Signed)
History and Physical Interval Note:  02/20/2018 12:05 PM  Traci Davis  has presented today for surgery, with the diagnosis of POSTMENOPAUSAL BLEEDING  The various methods of treatment have been discussed with the patient and family. After consideration of risks, benefits and other options for treatment, the patient has consented to  Procedure(s): [place] (N/A) as a surgical intervention .  The patient's history has been reviewed, patient examined, no change in status, stable for surgery.  I have reviewed the patient's chart and labs.  Questions were answered to the patient's satisfaction.    Prentice Docker, MD, [facility], [facility] 02/20/2018 12:05 PM

## 2018-02-20 NOTE — Anesthesia Postprocedure Evaluation (Signed)
Anesthesia Post Note  Patient: BATINA DOUGAN  Procedure(s) Performed: DILATATION & CURETTAGE/HYSTEROSCOPY WITH MYOSURE ENDOMETRIAL POLYPECTOMY (N/A )  Patient location during evaluation: PACU Anesthesia Type: General Level of consciousness: awake and alert Pain management: pain level controlled Vital Signs Assessment: post-procedure vital signs reviewed and stable Respiratory status: spontaneous breathing, nonlabored ventilation, respiratory function stable and patient connected to nasal cannula oxygen Cardiovascular status: blood pressure returned to baseline and stable Postop Assessment: no apparent nausea or vomiting Anesthetic complications: no     Last Vitals:  Vitals:   02/20/18 1416 02/20/18 1502  BP: (!) 119/96 112/66  Pulse: 76 72  Resp: 18   Temp: (!) 36.2 C   SpO2: 100%     Last Pain:  Vitals:   02/20/18 1502  TempSrc:   PainSc: 0-No pain                 Azusena Erlandson S

## 2018-02-20 NOTE — Anesthesia Post-op Follow-up Note (Signed)
Anesthesia QCDR form completed.        

## 2018-02-20 NOTE — Discharge Instructions (Addendum)
Hysteroscopy, Care After Refer to this sheet in the next few weeks. These instructions provide you with information on caring for yourself after your procedure. Your health care provider may also give you more specific instructions. Your treatment has been planned according to current medical practices, but problems sometimes occur. Call your health care provider if you have any problems or questions after your procedure. What can I expect after the procedure? After your procedure, it is typical to have the following:  You may have some cramping. This normally lasts for a couple days.  You may have bleeding. This can vary from light spotting for a few days to menstrual-like bleeding for 3-7 days.  Follow these instructions at home:  Rest for the first 1-2 days after the procedure.  Only take over-the-counter or prescription medicines as directed by your health care provider. Do not take aspirin. It can increase the chances of bleeding.  Take showers instead of baths for 2 weeks or as directed by your health care provider.  Do not drive for 24 hours or as directed.  Do not drink alcohol while taking pain medicine.  Do not use tampons, douche, or have sexual intercourse for 2 weeks or until your health care provider says it is okay.  Take your temperature twice a day for 4-5 days. Write it down each time.  Follow your health care provider's advice about diet, exercise, and lifting.  If you develop constipation, you may: ? Take a mild laxative if your health care provider approves. ? Add bran foods to your diet. ? Drink enough fluids to keep your urine clear or pale yellow.  Try to have someone with you or available to you for the first 24-48 hours, especially if you were given a general anesthetic.  Follow up with your health care provider as directed. Contact a health care provider if:  You feel dizzy or lightheaded.  You feel sick to your stomach (nauseous).  You have  abnormal vaginal discharge.  You have a rash.  You have pain that is not controlled with medicine. Get help right away if:  You have bleeding that is heavier than a normal menstrual period.  You have a fever.  You have increasing cramps or pain, not controlled with medicine.  You have new belly (abdominal) pain.  You pass out.  You have pain in the tops of your shoulders (shoulder strap areas).  You have shortness of breath. This information is not intended to replace advice given to you by your health care provider. Make sure you discuss any questions you have with your health care provider. Document Released: 02/04/2013 Document Revised: 09/22/2015 Document Reviewed: 11/13/2012 Elsevier Interactive Patient Education  2017 [place]   1) The drugs that you were given will stay in your system until tomorrow so for the next 24 hours you should not:  A) Drive an automobile B) Make any legal decisions C) Drink any alcoholic beverage   2) You may resume regular meals tomorrow.  Today it is better to start with liquids and gradually work up to solid foods.  You may eat anything you prefer, but it is better to start with liquids, then soup and crackers, and gradually work up to solid foods.   3) Please notify your doctor immediately if you have any unusual bleeding, trouble breathing, redness and pain at the surgery site, drainage, fever, or pain not relieved by medication.    4) Additional Instructions:  Please contact your physician with any problems or Same Day Surgery at [phone], Monday through Friday 6 am to 4 pm, or [place] at [facility] number at [phone].  Dilation and Curettage or Vacuum Curettage, Care After These instructions give you information about caring for yourself after your procedure. Your doctor may also give you more specific instructions. Call your doctor if you have any  problems or questions after your procedure. Follow these instructions at home: Activity  Do not drive or use heavy machinery while taking prescription pain medicine.  For 24 hours after your procedure, avoid driving.  Take short walks often, followed by rest periods. Ask your doctor what activities are safe for you. After one or two days, you may be able to return to your normal activities.  Do not lift anything that is heavier than 10 lb (4.5 kg) until your doctor approves.  For at least 2 weeks, or as long as told by your doctor: ? Do not douche. ? Do not use tampons. ? Do not have sex. General instructions  Take over-the-counter and prescription medicines only as told by your doctor. This is very important if you take blood thinning medicine.  Do not take baths, swim, or use a hot tub until your doctor approves. Take showers instead of baths.  Wear compression stockings as told by your doctor.  It is up to you to get the results of your procedure. Ask your doctor when your results will be ready.  Keep all follow-up visits as told by your doctor. This is important. Contact a doctor if:  You have very bad cramps that get worse or do not get better with medicine.  You have very bad pain in your belly (abdomen).  You cannot drink fluids without throwing up (vomiting).  You get pain in a different part of the area between your belly and thighs (pelvis).  You have bad-smelling discharge from your vagina.  You have a rash. Get help right away if:  You are bleeding a lot from your vagina. A lot of bleeding means soaking more than one sanitary pad in an hour, for 2 hours in a row.  You have clumps of blood (blood clots) coming from your vagina.  You have a fever or chills.  Your belly feels very tender or hard.  You have chest pain.  You have trouble breathing.  You cough up blood.  You feel dizzy.  You feel light-headed.  You pass out (faint).  You have pain  in your neck or shoulder area. Summary  Take short walks often, followed by rest periods. Ask your doctor what activities are safe for you. After one or two days, you may be able to return to your normal activities.  Do not lift anything that is heavier than 10 lb (4.5 kg) until your doctor approves.  Do not take baths, swim, or use a hot tub until your doctor approves. Take showers instead of baths.  Contact your doctor if you have any symptoms of infection, like bad-smelling discharge from your vagina. This information is not intended to replace advice given to you by your health care provider. Make sure you discuss any questions you have with your health care provider. Document Released: 01/24/2008 Document Revised: 01/02/2016 Document Reviewed: 01/02/2016 Elsevier Interactive Patient Education  2017 [employer].

## 2018-02-20 NOTE — Transfer of Care (Signed)
Immediate Anesthesia Transfer of Care Note  Patient: Traci Davis  Procedure(s) Performed: DILATATION & CURETTAGE/HYSTEROSCOPY WITH MYOSURE (N/A )  Patient Location: PACU  Anesthesia Type:General  Level of Consciousness: drowsy  Airway & Oxygen Therapy: Patient Spontanous Breathing and Patient connected to face mask oxygen  Post-op Assessment: Report given to RN and Post -op Vital signs reviewed and stable  Post vital signs: Reviewed and stable  Last Vitals:  Vitals Value Taken Time  BP 133/72 02/20/2018  1:25 PM  Temp 36.7 C 02/20/2018  1:25 PM  Pulse 75 02/20/2018  1:27 PM  Resp 16 02/20/2018  1:27 PM  SpO2 99 % 02/20/2018  1:27 PM  Vitals shown include unvalidated device data.  Last Pain:  Vitals:   02/20/18 1103  TempSrc: Tympanic  PainSc: 0-No pain         Complications: No apparent anesthesia complications

## 2018-02-20 NOTE — OR Nursing (Signed)
Anesthesia and dr Glennon Mac asked to review pre op labs.

## 2018-02-20 NOTE — Anesthesia Preprocedure Evaluation (Signed)
Anesthesia Evaluation  Patient identified by MRN, date of birth, ID band Patient awake    Reviewed: Allergy & Precautions, H&P , NPO status , Patient's Chart, lab work & pertinent test results  History of Anesthesia Complications Negative for: history of anesthetic complications  Airway Mallampati: III  TM Distance: <3 FB Neck ROM: full    Dental  (+) Chipped, Poor Dentition   Pulmonary asthma , COPD,           Cardiovascular Exercise Tolerance: Good hypertension, (-) angina(-) Past MI and (-) DOE      Neuro/Psych  Headaches, PSYCHIATRIC DISORDERS    GI/Hepatic Neg liver ROS, GERD  Medicated and Controlled,  Endo/Other  diabetes, Type 2  Renal/GU Renal disease     Musculoskeletal  (+) Arthritis ,   Abdominal   Peds  Hematology negative hematology ROS (+)   Anesthesia Other Findings Weakness in left arm and leg from CP  Past Medical History: No date: Anxiety No date: Arthritis No date: Asthma No date: Cerebral palsy (HCC) No date: Complication of anesthesia     Comment:  panic attacks before surgery No date: COPD (chronic obstructive pulmonary disease) (HCC) No date: Depression No date: Diabetes mellitus without complication (HCC) No date: GERD (gastroesophageal reflux disease) No date: Hypertension  Past Surgical History: 1995: CESAREAN SECTION No date: CHOLECYSTECTOMY No date: FOOT CAPSULE RELEASE W/ PERCUTANEOUS HEEL CORD LENGTHENING,  TIBIAL TENDON TRANSFER     Comment:  age 47 ,and 2010-both legs No date: JOINT REPLACEMENT; Right     Comment:  both knees partial replacements dates unknown No date: knee replacment     Comment:  x 5 per pt. last one- left knee 2014. has had 2 on R 3               on L 01/08/2018: TYMPANOPLASTY WITH GRAFT; Right     Comment:  Procedure: TYMPANOPLASTY WITH POSSIBLE OSSICULAR CHAIN               RECONSTRUCTION;  Surgeon: [name], MD;  Location:   [facility] ORS;  Service: ENT;  Laterality: Right;     Reproductive/Obstetrics negative OB ROS                             Anesthesia Physical Anesthesia Plan  ASA: III  Anesthesia Plan: General LMA   Post-op Pain Management:    Induction: Intravenous  PONV Risk Score and Plan: Ondansetron, Dexamethasone, Midazolam and Treatment may vary due to age or medical condition  Airway Management Planned: LMA  Additional Equipment:   Intra-op Plan:   Post-operative Plan: Extubation in OR  Informed Consent: I have reviewed the patients History and Physical, chart, labs and discussed the procedure including the risks, benefits and alternatives for the proposed anesthesia with the patient or authorized representative who has indicated his/her understanding and acceptance.   Dental Advisory Given  Plan Discussed with: Anesthesiologist, CRNA and Surgeon  Anesthesia Plan Comments: (Patient consented for risks of anesthesia including but not limited to:  - adverse reactions to medications - damage to teeth, lips or other oral mucosa - sore throat or hoarseness - Damage to heart, brain, lungs or loss of life  Patient voiced understanding.)        Anesthesia Quick Evaluation

## 2018-02-20 NOTE — Op Note (Signed)
Operative Note   02/20/2018  PRE-OP DIAGNOSIS: Postmenopausal bleeding   POST-OP DIAGNOSIS: Postmenopausal bleeding   SURGEON: Surgeon(s) and Role:    Will Bonnet, MD - Primary  PROCEDURE: Procedure(s): 1) Hysteroscopy 2) dilation and curettage 3) endometrial polypectomy  ANESTHESIA: general LMA  ESTIMATED BLOOD LOSS: 30 mL  DRAINS: none   TOTAL IV FLUIDS: 550 mL  SPECIMENS:  1) endometrial polyp (in fragments) 2) endometrial curettings  VTE PROPHYLAXIS: SCDs to the bilateral lower extremities  ANTIBIOTICS: none indicated, none given  FLUID DEFICIT: 65 mL  COMPLICATIONS: none  DISPOSITION: PACU - hemodynamically stable.  CONDITION: stable  INDICATION: 47 y.o. female with no menstruation in over 3 years presented to the office with vaginal bleeding.  A pap smear and endometrial biopsy was attempted in the office. The endometrial biopsy was unsuccessful due to patient discomfort.  A pelvic ultrasound showed an endometrial stripe of 8.5 mm. She was taken to the OR for further investigation and endometrial sampling.  FINDINGS: Exam under anesthesia revealed small, mobile uterus with no masses and bilateral adnexa without masses or fullness. However, exam limited by patient's body habitus. Hysteroscopy revealed a right sidewall, medium-sized endometrial polyp. Otherwise, the cavity was grossly normal appearing with bilateral tubal ostia and normal appearing endocervical canal.  PROCEDURE IN DETAIL:  After informed consent was obtained, the patient was taken to the operating room where anesthesia was obtained without difficulty. The patient was positioned in the dorsal lithotomy position in candy cane stirrups.  The patient's bladder was catheterized with an in-and-out foley catheter.  The patient was examined under anesthesia, with the above noted findings.  The bi-valved speculum was placed inside the patient's vagina, and the the anterior lip of the cervix was seen  and grasped with the tenaculum.  The cervix was progressively dilated to a 7 mm He[place].  The hysteroscope was introduced, with the above noted findings.  The MyoSure device was attached and the entire polyp was removed using the MyoSure device.    The hystersocope was removed and the uterine cavity was curetted until a gritty texture was noted, yielding small-moderate endometrial curettings.  The hysteroscope was reintroduced and hemostasis was noted.  The pressure on the fluid management system (intra-uterine pressure) was gradually lowered to a pressure well-below the MAP and hemostasis was again verified.  The hysteroscope was removed and all instruments were removed, with hemostasis noted at the tenaculum entry sites after application of silver nitrate.  She was then taken out of dorsal lithotomy.  The patient tolerated the procedure well.  Sponge, lap and needle counts were correct x2.  The patient was taken to recovery room in excellent condition.  Will Bonnet, MD, [place] 02/20/2018 1:25 PM

## 2018-02-20 NOTE — Anesthesia Procedure Notes (Signed)
Procedure Name: LMA Insertion Date/Time: 02/20/2018 12:25 PM Performed by: Eben Burow, CRNA Pre-anesthesia Checklist: Patient identified, Emergency Drugs available, Suction available, Patient being monitored and Timeout performed Patient Re-evaluated:Patient Re-evaluated prior to induction Oxygen Delivery Method: Circle system utilized Preoxygenation: Pre-oxygenation with 100% oxygen Induction Type: IV induction Ventilation: Mask ventilation without difficulty LMA: LMA inserted LMA Size: 4.0 Number of attempts: 1 Placement Confirmation: positive ETCO2 and breath sounds checked- equal and bilateral Tube secured with: Tape Dental Injury: Teeth and Oropharynx as per pre-operative assessment

## 2018-02-20 NOTE — OR Nursing (Signed)
Urine pregnancy test cancelled per Dr Glennon Mac. Patient had not had a period since 2016 until recent vaginal bleeding.  Dr Amie Critchley.

## 2018-02-21 ENCOUNTER — Encounter: Payer: Self-pay | Admitting: Obstetrics and Gynecology

## 2018-02-21 LAB — CYTOLOGY - PAP
Chlamydia: NEGATIVE
Diagnosis: NEGATIVE
HPV (WINDOPATH): NOT DETECTED
Neisseria Gonorrhea: NEGATIVE

## 2018-02-22 LAB — SURGICAL PATHOLOGY

## 2018-02-24 ENCOUNTER — Telehealth: Payer: Self-pay

## 2018-02-24 NOTE — Telephone Encounter (Signed)
Pt is calling triage today stating she is still having some bleeding from her procedure done on Thursday with SDJ. I spoke with [place] to get his advice on it in SDJ absence, he stated it is normal for 1 to 2 weeks after the procedure unless she is heavily bleeding she should be okay.

## 2018-03-04 ENCOUNTER — Ambulatory Visit (INDEPENDENT_AMBULATORY_CARE_PROVIDER_SITE_OTHER): Payer: Medicare Other | Admitting: Psychiatry

## 2018-03-04 ENCOUNTER — Other Ambulatory Visit: Payer: Self-pay

## 2018-03-04 ENCOUNTER — Encounter: Payer: Self-pay | Admitting: Psychiatry

## 2018-03-04 VITALS — BP 102/71 | HR 70 | Temp 97.8°F

## 2018-03-04 DIAGNOSIS — F330 Major depressive disorder, recurrent, mild: Secondary | ICD-10-CM

## 2018-03-04 DIAGNOSIS — F411 Generalized anxiety disorder: Secondary | ICD-10-CM

## 2018-03-04 DIAGNOSIS — F5105 Insomnia due to other mental disorder: Secondary | ICD-10-CM

## 2018-03-04 DIAGNOSIS — S335XXA Sprain of ligaments of lumbar spine, initial encounter: Secondary | ICD-10-CM | POA: Insufficient documentation

## 2018-03-04 DIAGNOSIS — D493 Neoplasm of unspecified behavior of breast: Secondary | ICD-10-CM | POA: Insufficient documentation

## 2018-03-04 DIAGNOSIS — M6281 Muscle weakness (generalized): Secondary | ICD-10-CM | POA: Insufficient documentation

## 2018-03-04 DIAGNOSIS — S139XXA Sprain of joints and ligaments of unspecified parts of neck, initial encounter: Secondary | ICD-10-CM | POA: Insufficient documentation

## 2018-03-04 DIAGNOSIS — I879 Disorder of vein, unspecified: Secondary | ICD-10-CM | POA: Insufficient documentation

## 2018-03-04 MED ORDER — FLUOXETINE HCL 40 MG PO CAPS: 40.0000 mg | ORAL_CAPSULE | Freq: Every day | ORAL | 1 refills | Status: DC

## 2018-03-04 MED ORDER — HYDROXYZINE PAMOATE 25 MG PO CAPS: 25.0000 mg | ORAL_CAPSULE | Freq: Three times a day (TID) | ORAL | 1 refills | Status: DC | PRN

## 2018-03-04 MED ORDER — RAMELTEON 8 MG PO TABS: 8.0000 mg | ORAL_TABLET | Freq: Every day | ORAL | 1 refills | Status: DC

## 2018-03-04 MED ORDER — ARIPIPRAZOLE 5 MG PO TABS: 5.0000 mg | ORAL_TABLET | Freq: Every day | ORAL | 1 refills | Status: DC

## 2018-03-04 NOTE — Progress Notes (Signed)
[facility] MD OP Progress Note  03/04/2018 4:23 PM Traci Davis  MRN:  [id number]  Chief Complaint: ' I am here for follow up.' Chief Complaint    Follow-up; Medication Refill     HPI: Traci Davis is a 47 year old Caucasian female, lives in [place], is on SSD, never been married, has a history of depression, generalized anxiety disorder, insomnia, cerebral palsy, diabetes mellitus, presented to the clinic today for a follow-up visit.  Patient today presented along with her boyfriend John.  Collateral information was obtained from [place].  Patient reports she is having some relationship struggles with her boyfriend right now.  She reports he does not want to be involved with her daughter who recently gave birth to a baby.  Patient reports that has been distressing to her.  She reports she hence feels confused whether she wants to stay with him or not.  Patient reports that has been distressing to her since she does not know how to move forward.  Patient otherwise denies any significant mood symptoms.  She reports sleep is good on the Rozerem.  She denies any suicidality.  She denies any perceptual disturbances.  Her boyfriend reported that patient has been having struggles recently.  She seems happy however he has seen her texting her old boyfriends which has been causing some conflict in the relationships.  Discussed with patient to start psychotherapy sessions again with Elmyra Ricks.  Patient does not want any medication changes today.   Visit Diagnosis:    ICD-10-CM   1. MDD (major depressive disorder), recurrent episode, mild (HCC) F33.0    in parital remission  2. GAD (generalized anxiety disorder) F41.1 FLUoxetine (PROZAC) 40 MG capsule    hydrOXYzine (VISTARIL) 25 MG capsule  3. Insomnia due to mental condition F51.05     Past Psychiatric History: I have reviewed past psychiatric history from my progress note on 09/12/2017.  Past trials of Xanax, Klonopin, Cymbalta  Past Medical History:  Past  Medical History:  Diagnosis Date  . Anxiety   . Arthritis   . Asthma   . Cerebral palsy ([place])   . Complication of anesthesia    panic attacks before surgery  . COPD (chronic obstructive pulmonary disease) ([place])   . Depression   . Diabetes mellitus without complication ([place])   . GERD (gastroesophageal reflux disease)   . Hypertension     Past Surgical History:  Procedure Laterality Date  . CESAREAN SECTION  1995  . CHOLECYSTECTOMY    . DILATATION & CURETTAGE/HYSTEROSCOPY WITH MYOSURE N/A 02/20/2018   Procedure: DILATATION & CURETTAGE/HYSTEROSCOPY WITH MYOSURE ENDOMETRIAL POLYPECTOMY;  Surgeon: Will Bonnet, MD;  Location: [facility] ORS;  Service: Gynecology;  Laterality: N/A;  . FOOT CAPSULE RELEASE W/ PERCUTANEOUS HEEL CORD LENGTHENING, TIBIAL TENDON TRANSFER     age 73 ,and 2010-both legs  . JOINT REPLACEMENT Right    both knees partial replacements dates unknown  . knee replacment     x 5 per pt. last one- left knee 2014. has had 2 on R 3 on L  . TYMPANOPLASTY WITH GRAFT Right 01/08/2018   Procedure: TYMPANOPLASTY WITH POSSIBLE OSSICULAR CHAIN RECONSTRUCTION;  Surgeon: [name], MD;  Location: [facility] ORS;  Service: ENT;  Laterality: Right;    Family Psychiatric History: I have reviewed family psychiatric history from my progress note on 09/12/2017  Family History:  Family History  Problem Relation Age of Onset  . CAD Father   . Heart attack Brother   . Sexual abuse Paternal Grandfather   .  Breast cancer Neg Hx     Social History: Reviewed social history from my progress note on 09/12/2017. Social History   Socioeconomic History  . Marital status: Single    Spouse name: Not on file  . Number of children: Not on file  . Years of education: Not on file  . Highest education level: Not on file  Occupational History  . Not on file  Social Needs  . Financial resource strain: Not on file  . Food insecurity:    Worry: Not on file    Inability: Not on file  .  Transportation needs:    Medical: Not on file    Non-medical: Not on file  Tobacco Use  . Smoking status: Never Smoker  . Smokeless tobacco: Never Used  Substance and Sexual Activity  . Alcohol use: No  . Drug use: No  . Sexual activity: Yes  Lifestyle  . Physical activity:    Days per week: Not on file    Minutes per session: Not on file  . Stress: Not on file  Relationships  . Social connections:    Talks on phone: Not on file    Gets together: Not on file    Attends religious service: Not on file    Active member of club or organization: Not on file    Attends meetings of clubs or organizations: Not on file    Relationship status: Not on file  Other Topics Concern  . Not on file  Social History Narrative  . Not on file    Allergies:  Allergies  Allergen Reactions  . Meloxicam Other (See Comments)    Damage kidney  . Morphine And Related Other (See Comments)    hallucinations  . Vantin [Cefpodoxime] Nausea And Vomiting  . Latex Itching  . Baclofen Other (See Comments)    "makes cerebral palsy do adverse reaction on me", tightens muscles   . Ciprofloxacin Itching and Nausea And Vomiting  . Tape Rash    skin tears.  Paper tape is ok    Metabolic Disorder Labs: Lab Results  Component Value Date   HGBA1C 6.1 (H) 02/19/2015   No results found for: PROLACTIN No results found for: CHOL, TRIG, HDL, CHOLHDL, VLDL, LDLCALC Lab Results  Component Value Date   TSH 2.198 07/10/2016   TSH 2.891 02/19/2015    Therapeutic Level Labs: No results found for: LITHIUM No results found for: VALPROATE No components found for:  CBMZ  Current Medications: Current Outpatient Medications  Medication Sig Dispense Refill  . albuterol (PROVENTIL HFA;VENTOLIN HFA) 108 (90 BASE) MCG/ACT inhaler Inhale 2 puffs into the lungs 4 (four) times daily as needed. For shortness of breath and/or wheezing    . Blood Glucose Monitoring Suppl (GLUCOCOM BLOOD GLUCOSE MONITOR) DEVI Test daily  before all meals/snacks and once before bedtime.    . chlorzoxazone (PARAFON) 500 MG tablet Take 500 mg by mouth 3 (three) times daily as needed for muscle spasms.    . Cholecalciferol (VITAMIN D3) 5000 units CAPS Take 5,000 Units by mouth daily.    . diclofenac sodium (VOLTAREN) 1 % GEL Apply 1 application topically as needed for pain.    Marland Kitchen donepezil (ARICEPT) 10 MG tablet Take 10 mg by mouth at bedtime.     Marland Kitchen FLUoxetine (PROZAC) 40 MG capsule Take 1 capsule (40 mg total) by mouth daily. 90 capsule 1  . furosemide (LASIX) 40 MG tablet Take 40 mg by mouth daily as needed for fluid.     Marland Kitchen  glimepiride (AMARYL) 4 MG tablet Take 4 mg by mouth daily with breakfast.    . glucose blood (ONE TOUCH ULTRA TEST) test strip USE TO TEST BLOOD SUGAR TWO TIMES A DAY    . HYDROmorphone (DILAUDID) 2 MG tablet Take 2 mg by mouth 3 (three) times daily as needed for severe pain.    . hydrOXYzine (VISTARIL) 25 MG capsule Take 1 capsule (25 mg total) by mouth 3 (three) times daily as needed for anxiety. Only for severe anxiety attacks 270 capsule 1  . incobotulinumtoxinA (XEOMIN) 100 units SOLR injection Inject 100 Units into the muscle every 3 (three) months.     . Lancets (FREESTYLE) lancets Test daily before all meals/snacks    . lisinopril (PRINIVIL,ZESTRIL) 20 MG tablet Take 20 mg by mouth daily.    . mupirocin ointment (BACTROBAN) 2 % Apply 1 application topically 2 (two) times daily.    Marland Kitchen nystatin (MYCOSTATIN/NYSTOP) powder Apply 1 g topically daily as needed (rash).     Marland Kitchen ofloxacin (OCUFLOX) 0.3 % ophthalmic solution Place 4 drops into the right ear 2 (two) times daily. 5 mL 0  . omeprazole (PRILOSEC) 40 MG capsule Take 40 mg by mouth daily.     . pioglitazone (ACTOS) 45 MG tablet Take 45 mg by mouth daily.    . polyethylene glycol (MIRALAX / GLYCOLAX) packet Take 17 gm mixed in liqiud as needed for constipation    . pregabalin (LYRICA) 25 MG capsule Take 50 mg by mouth at bedtime.    . ramelteon (ROZEREM) 8  MG tablet Take 1 tablet (8 mg total) by mouth at bedtime. 90 tablet 1  . solifenacin (VESICARE) 10 MG tablet Take 10 mg by mouth daily.    . Tiotropium Bromide Monohydrate (SPIRIVA RESPIMAT) 1.25 MCG/ACT AERS Inhale 1 puff into the lungs daily.    Marland Kitchen topiramate (TOPAMAX) 100 MG tablet Take 100 mg by mouth 2 (two) times daily.     . ARIPiprazole (ABILIFY) 5 MG tablet Take 1 tablet (5 mg total) by mouth daily. 90 tablet 1  . meloxicam (MOBIC) 15 MG tablet meloxicam 15 mg tablet     No current facility-administered medications for this visit.      Musculoskeletal: Strength & Muscle Tone: within normal limits Gait & Station: in a wheel chair Patient leans: N/A  Psychiatric Specialty Exam: Review of Systems  Psychiatric/Behavioral: The patient is nervous/anxious.   All other systems reviewed and are negative.   Blood pressure 102/71, pulse 70, temperature 97.8 F (36.6 C), temperature source Oral, last menstrual period 08/20/2014.There is no height or weight on file to calculate BMI.  General Appearance: Casual  Eye Contact:  Fair  Speech:  Clear and Coherent  Volume:  Normal  Mood:  Anxious  Affect:  Congruent  Thought Process:  Goal Directed and Descriptions of Associations: Intact  Orientation:  Full (Time, Place, and Person)  Thought Content: Logical   Suicidal Thoughts:  No  Homicidal Thoughts:  No  Memory:  Immediate;   Fair Recent;   Fair Remote;   Fair  Judgement:  Fair  Insight:  Fair  Psychomotor Activity:  Normal  Concentration:  Concentration: Fair and Attention Span: Fair  Recall:  [employer] of Knowledge: Fair  Language: Fair  Akathisia:  No  Handed:  Right  AIMS (if indicated): na  Assets:  Communication Skills Desire for Improvement Social Support  ADL's:  Intact  Cognition: WNL  Sleep:  Fair   Screenings:   Assessment and  Plan: Traci Davis is a 47 year old Caucasian female who has a history of depression, chronic pain, diabetes mellitus, MCI, cerebral  palsy, presented to the clinic today for a follow-up visit.  Patient today reports some relationship conflicts.  She otherwise reports being compliant with medications.  Discussed restarting psychotherapy sessions.  Plan as noted below.  Plan Depression Continue Prozac 40 mg p.o. Daily Abilify 5 mg po daily.  For GAD Prozac 40 mg p.o. daily with breakfast Hydroxyzine as prescribed  Insomnia Rozerem 8 mg p.o. nightly  She will restart psychotherapy sessions with Ms. Peacock.  Follow-up in clinic in 4 weeks or sooner if needed.  More than 50 % of the time was spent for psychoeducation and supportive psychotherapy and care coordination.  This note was generated in part or whole with voice recognition software. Voice recognition is usually quite accurate but there are transcription errors that can and very often do occur. I apologize for any typographical errors that were not detected and corrected.         Ursula Alert, MD 03/04/2018, 4:23 PM

## 2018-03-07 ENCOUNTER — Ambulatory Visit (INDEPENDENT_AMBULATORY_CARE_PROVIDER_SITE_OTHER): Payer: Medicare Other | Admitting: Obstetrics and Gynecology

## 2018-03-07 ENCOUNTER — Encounter: Payer: Self-pay | Admitting: Obstetrics and Gynecology

## 2018-03-07 VITALS — BP 128/88

## 2018-03-07 DIAGNOSIS — Z09 Encounter for follow-up examination after completed treatment for conditions other than malignant neoplasm: Secondary | ICD-10-CM

## 2018-03-07 NOTE — Progress Notes (Signed)
   Postoperative Follow-up Patient presents post op from hysteroscopy, D&C, polypectomy 2 weeks ago for postmenopausal bleeding.  Subjective: Patient reports marked improvement in her preop symptoms. Eating a regular diet without difficulty. The patient is not having any pain.  Activity: normal activities of daily living.  Objective: Vitals:   03/07/18 1341  BP: 128/88   Vital Signs: BP 128/88   LMP 08/20/2014  Constitutional: Well nourished, well developed female in no acute distress.  HEENT: normal Skin: Warm and dry.  Extremity: no edema  Abdomen: Soft, non-tender, normal bowel sounds; no bruits, organomegaly or masses.   Assessment: 47 y.o. s/p hysteroscopy, D&C, polypectomy progressing well  Plan: Patient has done well after surgery with no apparent complications.  I have discussed the post-operative course to date, and the expected progress moving forward.  The patient understands what complications to be concerned about.  I will see the patient in routine follow up, or sooner if needed.    Activity plan: No restriction.  Prentice Docker, MD 03/07/2018, 2:17 PM

## 2018-03-26 ENCOUNTER — Ambulatory Visit: Payer: Medicare Other | Admitting: Licensed Clinical Social Worker

## 2018-03-26 DIAGNOSIS — G3184 Mild cognitive impairment, so stated: Secondary | ICD-10-CM

## 2018-03-26 DIAGNOSIS — F330 Major depressive disorder, recurrent, mild: Secondary | ICD-10-CM

## 2018-04-03 ENCOUNTER — Ambulatory Visit (INDEPENDENT_AMBULATORY_CARE_PROVIDER_SITE_OTHER): Payer: Medicare Other | Admitting: Psychiatry

## 2018-04-03 ENCOUNTER — Encounter: Payer: Self-pay | Admitting: Psychiatry

## 2018-04-03 ENCOUNTER — Other Ambulatory Visit: Payer: Self-pay

## 2018-04-03 VITALS — BP 121/78 | HR 59 | Temp 98.1°F

## 2018-04-03 DIAGNOSIS — F330 Major depressive disorder, recurrent, mild: Secondary | ICD-10-CM | POA: Diagnosis not present

## 2018-04-03 DIAGNOSIS — F411 Generalized anxiety disorder: Secondary | ICD-10-CM

## 2018-04-03 DIAGNOSIS — F5105 Insomnia due to other mental disorder: Secondary | ICD-10-CM

## 2018-04-03 NOTE — Progress Notes (Signed)
[facility] MD OP Progress Note  04/03/2018 3:44 PM Traci Davis  MRN:  [id number]  Chief Complaint: ' I am here for follow up." Chief Complaint    Follow-up; Medication Refill     HPI: Traci Davis is a 47 year old Caucasian female, lives in [place], on [place], never been married, has a history of depression, generalized anxiety disorder, insomnia, cerebral palsy, diabetes mellitus, presented to the clinic today for a follow-up visit.  Patient presented along with her boyfriend Traci Davis.  Patient today reports she is currently making progress on the current medication regimen.  She is compliant on her medications.  She denies any side effects.  She reports the Rozerem is helping with her sleep.  She however reports there are nights when she feels like she does not need it since the combination of medication she is on for her pain along with the Rozerem makes her groggy.  Discussed with her to take the Rozerem only  as needed and she agrees with plan.  Patient reports she continues to struggle with some relationship struggles with her boyfriend.  Discussed with patient to discuss that in psychotherapy sessions.  Patient denies any suicidality.  Patient denies any perceptual disturbances.  Patient denies any other concerns today . Visit Diagnosis:    ICD-10-CM   1. MDD (major depressive disorder), recurrent episode, mild (HCC) F33.0    improving  2. GAD (generalized anxiety disorder) F41.1    improving  3. Insomnia due to mental condition F51.05    improved    Past Psychiatric History: Have reviewed past psychiatric history from my progress note on 09/12/2017.  Past trials of Xanax, Klonopin, Cymbalta  Past Medical History:  Past Medical History:  Diagnosis Date  . Anxiety   . Arthritis   . Asthma   . Cerebral palsy ([place])   . Complication of anesthesia    panic attacks before surgery  . COPD (chronic obstructive pulmonary disease) ([place])   . Depression   . Diabetes mellitus without complication  ([place])   . GERD (gastroesophageal reflux disease)   . Hypertension     Past Surgical History:  Procedure Laterality Date  . CESAREAN SECTION  1995  . CHOLECYSTECTOMY    . DILATATION & CURETTAGE/HYSTEROSCOPY WITH MYOSURE N/A 02/20/2018   Procedure: DILATATION & CURETTAGE/HYSTEROSCOPY WITH MYOSURE ENDOMETRIAL POLYPECTOMY;  Surgeon: Will Bonnet, MD;  Location: [facility] ORS;  Service: Gynecology;  Laterality: N/A;  . FOOT CAPSULE RELEASE W/ PERCUTANEOUS HEEL CORD LENGTHENING, TIBIAL TENDON TRANSFER     age 28 ,and 2010-both legs  . JOINT REPLACEMENT Right    both knees partial replacements dates unknown  . knee replacment     x 5 per pt. last one- left knee 2014. has had 2 on R 3 on L  . TYMPANOPLASTY WITH GRAFT Right 01/08/2018   Procedure: TYMPANOPLASTY WITH POSSIBLE OSSICULAR CHAIN RECONSTRUCTION;  Surgeon: [name], MD;  Location: [facility] ORS;  Service: ENT;  Laterality: Right;    Family Psychiatric History: Have reviewed family psychiatric history from my progress note on 09/12/2017  Family History:  Family History  Problem Relation Age of Onset  . CAD Father   . Heart attack Brother   . Sexual abuse Paternal Grandfather   . Breast cancer Neg Hx     Social History: Have reviewed social history from my progress note on 09/12/2017 Social History   Socioeconomic History  . Marital status: Single    Spouse name: Not on file  . Number of children: Not on  file  . Years of education: Not on file  . Highest education level: Not on file  Occupational History  . Not on file  Social Needs  . Financial resource strain: Not on file  . Food insecurity:    Worry: Not on file    Inability: Not on file  . Transportation needs:    Medical: Not on file    Non-medical: Not on file  Tobacco Use  . Smoking status: Never Smoker  . Smokeless tobacco: Never Used  Substance and Sexual Activity  . Alcohol use: No  . Drug use: No  . Sexual activity: Yes  Lifestyle  . Physical activity:     Days per week: Not on file    Minutes per session: Not on file  . Stress: Not on file  Relationships  . Social connections:    Talks on phone: Not on file    Gets together: Not on file    Attends religious service: Not on file    Active member of club or organization: Not on file    Attends meetings of clubs or organizations: Not on file    Relationship status: Not on file  Other Topics Concern  . Not on file  Social History Narrative  . Not on file    Allergies:  Allergies  Allergen Reactions  . Meloxicam Other (See Comments)    Damage kidney  . Morphine And Related Other (See Comments)    hallucinations  . Vantin [Cefpodoxime] Nausea And Vomiting  . Latex Itching  . Baclofen Other (See Comments)    "makes cerebral palsy do adverse reaction on me", tightens muscles   . Ciprofloxacin Itching and Nausea And Vomiting  . Tape Rash    skin tears.  Paper tape is ok    Metabolic Disorder Labs: Lab Results  Component Value Date   HGBA1C 6.1 (H) 02/19/2015   No results found for: PROLACTIN No results found for: CHOL, TRIG, HDL, CHOLHDL, VLDL, LDLCALC Lab Results  Component Value Date   TSH 2.198 07/10/2016   TSH 2.891 02/19/2015    Therapeutic Level Labs: No results found for: LITHIUM No results found for: VALPROATE No components found for:  CBMZ  Current Medications: Current Outpatient Medications  Medication Sig Dispense Refill  . albuterol (PROVENTIL HFA;VENTOLIN HFA) 108 (90 BASE) MCG/ACT inhaler Inhale 2 puffs into the lungs 4 (four) times daily as needed. For shortness of breath and/or wheezing    . amoxicillin-clavulanate (AUGMENTIN) 500-125 MG tablet     . ARIPiprazole (ABILIFY) 5 MG tablet Take 1 tablet (5 mg total) by mouth daily. 90 tablet 1  . Blood Glucose Monitoring Suppl (GLUCOCOM BLOOD GLUCOSE MONITOR) DEVI Test daily before all meals/snacks and once before bedtime.    . chlorzoxazone (PARAFON) 500 MG tablet Take 500 mg by mouth 3 (three) times  daily as needed for muscle spasms.    . Cholecalciferol (VITAMIN D3) 5000 units CAPS Take 5,000 Units by mouth daily.    Marland Kitchen CREON 36000 units CPEP capsule     . diclofenac sodium (VOLTAREN) 1 % GEL Apply 1 application topically as needed for pain.    Marland Kitchen donepezil (ARICEPT) 10 MG tablet Take 10 mg by mouth at bedtime.     Marland Kitchen FLUoxetine (PROZAC) 40 MG capsule Take 1 capsule (40 mg total) by mouth daily. 90 capsule 1  . furosemide (LASIX) 40 MG tablet Take 40 mg by mouth daily as needed for fluid.     Marland Kitchen glimepiride (AMARYL) 4  MG tablet Take 4 mg by mouth daily with breakfast.    . glucose blood (ONE TOUCH ULTRA TEST) test strip USE TO TEST BLOOD SUGAR TWO TIMES A DAY    . HYDROmorphone (DILAUDID) 2 MG tablet Take 2 mg by mouth 3 (three) times daily as needed for severe pain.    . hydrOXYzine (VISTARIL) 25 MG capsule Take 1 capsule (25 mg total) by mouth 3 (three) times daily as needed for anxiety. Only for severe anxiety attacks 270 capsule 1  . incobotulinumtoxinA (XEOMIN) 100 units SOLR injection Inject 100 Units into the muscle every 3 (three) months.     . Lancets (FREESTYLE) lancets Test daily before all meals/snacks    . lidocaine (LIDODERM) 5 % Place onto the skin.    Marland Kitchen lisinopril (PRINIVIL,ZESTRIL) 20 MG tablet Take 20 mg by mouth daily.    . meloxicam (MOBIC) 15 MG tablet meloxicam 15 mg tablet    . mupirocin ointment (BACTROBAN) 2 % Apply 1 application topically 2 (two) times daily.    . nitrofurantoin (MACRODANTIN) 100 MG capsule     . nystatin (MYCOSTATIN/NYSTOP) powder Apply 1 g topically daily as needed (rash).     Marland Kitchen ofloxacin (OCUFLOX) 0.3 % ophthalmic solution Place 4 drops into the right ear 2 (two) times daily. 5 mL 0  . omeprazole (PRILOSEC) 40 MG capsule Take 40 mg by mouth daily.     . pioglitazone (ACTOS) 45 MG tablet Take 45 mg by mouth daily.    . polyethylene glycol (MIRALAX / GLYCOLAX) packet Take 17 gm mixed in liqiud as needed for constipation    . pregabalin (LYRICA) 25  MG capsule Take 50 mg by mouth at bedtime.    . ramelteon (ROZEREM) 8 MG tablet Take 1 tablet (8 mg total) by mouth at bedtime. 90 tablet 1  . solifenacin (VESICARE) 10 MG tablet Take 10 mg by mouth daily.    . Tiotropium Bromide Monohydrate (SPIRIVA RESPIMAT) 1.25 MCG/ACT AERS Inhale 1 puff into the lungs daily.    Marland Kitchen topiramate (TOPAMAX) 100 MG tablet Take 100 mg by mouth 2 (two) times daily.     . Venlafaxine HCl 37.5 MG TB24 Take by mouth.     No current facility-administered medications for this visit.      Musculoskeletal: Strength & Muscle Tone: limited Gait & Station: wheelchair bound Patient leans: N/A  Psychiatric Specialty Exam: Review of Systems  Psychiatric/Behavioral: Negative for depression. The patient is not nervous/anxious.   All other systems reviewed and are negative.   Blood pressure 121/78, pulse (!) 59, temperature 98.1 F (36.7 C), temperature source Oral, last menstrual period 08/20/2014, SpO2 93 %.There is no height or weight on file to calculate BMI.  General Appearance: Casual  Eye Contact:  Fair  Speech:  Clear and Coherent  Volume:  Normal  Mood:  Euthymic  Affect:  Appropriate  Thought Process:  Goal Directed and Descriptions of Associations: Intact  Orientation:  Full (Time, Place, and Person)  Thought Content: Logical   Suicidal Thoughts:  No  Homicidal Thoughts:  No  Memory:  Immediate;   Fair Recent;   Fair Remote;   Fair  Judgement:  Fair  Insight:  Fair  Psychomotor Activity:  baseline  Concentration:  Concentration: Fair and Attention Span: Fair  Recall:  [employer] of Knowledge: Fair  Language: Fair  Akathisia:  No  Handed:  Right  AIMS (if indicated):0  Assets:  Communication Skills Desire for Improvement Social Support  ADL's:  Intact  Cognition: WNL  Sleep:  Fair   Screenings:   Assessment and Plan: Sameen is a 47 yr old Caucasian female who has a history of depression, chronic pain, diabetes mellitus, MCI, cerebral  palsy, presented to the clinic today for a follow-up visit.  Patient is currently making progress on the current medication regimen.  She continues to struggle with relationship conflicts.  She will continue psychotherapy sessions for the same.  Plan as noted below.  Plan For depression Prozac 40 mg p.o. daily Abilify 5 mg p.o. Daily  For GAD Prozac 40 mg p.o. daily with breakfast  Hydroxyzine as prescribed  For insomnia Rozerem 8 mg p.o. nightly  She will continue psychotherapy sessions with Ms. Peacock.  Reports she is going to be started on Effexor for her migraine headaches.  Discussed with patient that if she is going to be started on Effexor, the Prozac needs to be discontinued.  She reports she would like to continue on the Prozac.  She plans to talk to her neurologist about alternate migraine medication.  Follow-up in clinic in 1 month or sooner if needed.  More than 50 % of the time was spent for psychoeducation and supportive psychotherapy and care coordination.  This note was generated in part or whole with voice recognition software. Voice recognition is usually quite accurate but there are transcription errors that can and very often do occur. I apologize for any typographical errors that were not detected and corrected.       Ursula Alert, MD 04/04/2018, 11:33 AM

## 2018-04-04 ENCOUNTER — Encounter: Payer: Self-pay | Admitting: Psychiatry

## 2018-05-06 ENCOUNTER — Encounter: Payer: Self-pay | Admitting: Psychiatry

## 2018-05-06 ENCOUNTER — Ambulatory Visit (INDEPENDENT_AMBULATORY_CARE_PROVIDER_SITE_OTHER): Payer: Medicare Other | Admitting: Psychiatry

## 2018-05-06 ENCOUNTER — Ambulatory Visit: Payer: Medicare Other | Admitting: Licensed Clinical Social Worker

## 2018-05-06 VITALS — BP 123/70 | HR 66 | Ht 62.0 in | Wt 229.0 lb

## 2018-05-06 DIAGNOSIS — F411 Generalized anxiety disorder: Secondary | ICD-10-CM | POA: Diagnosis not present

## 2018-05-06 DIAGNOSIS — F5105 Insomnia due to other mental disorder: Secondary | ICD-10-CM

## 2018-05-06 DIAGNOSIS — F330 Major depressive disorder, recurrent, mild: Secondary | ICD-10-CM | POA: Diagnosis not present

## 2018-05-06 MED ORDER — BUPROPION HCL 100 MG PO TABS: 100.0000 mg | ORAL_TABLET | Freq: Every morning | ORAL | 0 refills | Status: DC

## 2018-05-06 NOTE — Patient Instructions (Signed)
Bupropion tablets (Depression/Mood Disorders)  What is this medicine?  BUPROPION (byoo PROE pee on) is used to treat depression.  This medicine may be used for other purposes; ask your health care provider or pharmacist if you have questions.  COMMON BRAND NAME(S): Wellbutrin  What should I tell my health care provider before I take this medicine?  They need to know if you have any of these conditions:  -an eating disorder, such as anorexia or bulimia  -bipolar disorder or psychosis  -diabetes or high blood sugar, treated with medication  -glaucoma  -heart disease, previous heart attack, or irregular heart beat  -head injury or brain tumor  -high blood pressure  -kidney or liver disease  -seizures  -suicidal thoughts or a previous suicide attempt  -Tourette's syndrome  -weight loss  -an unusual or allergic reaction to bupropion, other medicines, foods, dyes, or preservatives  -breast-feeding  -pregnant or trying to become pregnant  How should I use this medicine?  Take this medicine by mouth with a glass of water. Follow the directions on the prescription label. You can take it with or without food. If it upsets your stomach, take it with food. Take your medicine at regular intervals. Do not take your medicine more often than directed. Do not stop taking this medicine suddenly except upon the advice of your doctor. Stopping this medicine too quickly may cause serious side effects or your condition may worsen.  A special MedGuide will be given to you by the pharmacist with each prescription and refill. Be sure to read this information carefully each time.  Talk to your pediatrician regarding the use of this medicine in children. Special care may be needed.  Overdosage: If you think you have taken too much of this medicine contact a poison control center or emergency room at once.  NOTE: This medicine is only for you. Do not share this medicine with others.  What if I miss a dose?  If you miss a dose, take it as soon  as you can. If it is less than four hours to your next dose, take only that dose and skip the missed dose. Do not take double or extra doses.  What may interact with this medicine?  Do not take this medicine with any of the following medications:  -linezolid  -MAOIs like Azilect, Carbex, Eldepryl, Marplan, Nardil, and Parnate  -methylene blue (injected into a vein)  -other medicines that contain bupropion like Zyban  This medicine may also interact with the following medications:  -alcohol  -certain medicines for anxiety or sleep  -certain medicines for blood pressure like metoprolol, propranolol  -certain medicines for depression or psychotic disturbances  -certain medicines for HIV or AIDS like efavirenz, lopinavir, nelfinavir, ritonavir  -certain medicines for irregular heart beat like propafenone, flecainide  -certain medicines for Parkinson's disease like amantadine, levodopa  -certain medicines for seizures like carbamazepine, phenytoin, phenobarbital  -cimetidine  -clopidogrel  -cyclophosphamide  -digoxin  -furazolidone  -isoniazid  -nicotine  -orphenadrine  -procarbazine  -steroid medicines like prednisone or cortisone  -stimulant medicines for attention disorders, weight loss, or to stay awake  -tamoxifen  -theophylline  -thiotepa  -ticlopidine  -tramadol  -warfarin  This list may not describe all possible interactions. Give your health care provider a list of all the medicines, herbs, non-prescription drugs, or dietary supplements you use. Also tell them if you smoke, drink alcohol, or use illegal drugs. Some items may interact with your medicine.  What should I watch for   while using this medicine?  Tell your doctor if your symptoms do not get better or if they get worse. Visit your doctor or health care professional for regular checks on your progress. Because it may take several weeks to see the full effects of this medicine, it is important to continue your treatment as prescribed by your  doctor.  Patients and their families should watch out for new or worsening thoughts of suicide or depression. Also watch out for sudden changes in feelings such as feeling anxious, agitated, panicky, irritable, hostile, aggressive, impulsive, severely restless, overly excited and hyperactive, or not being able to sleep. If this happens, especially at the beginning of treatment or after a change in dose, call your health care professional.  Avoid alcoholic drinks while taking this medicine. Drinking excessive alcoholic beverages, using sleeping or anxiety medicines, or quickly stopping the use of these agents while taking this medicine may increase your risk for a seizure.  Do not drive or use heavy machinery until you know how this medicine affects you. This medicine can impair your ability to perform these tasks.  Do not take this medicine close to bedtime. It may prevent you from sleeping.  Your mouth may get dry. Chewing sugarless gum or sucking hard candy, and drinking plenty of water may help. Contact your doctor if the problem does not go away or is severe.  What side effects may I notice from receiving this medicine?  Side effects that you should report to your doctor or health care professional as soon as possible:  -allergic reactions like skin rash, itching or hives, swelling of the face, lips, or tongue  -breathing problems  -changes in vision  -confusion  -elevated mood, decreased need for sleep, racing thoughts, impulsive behavior  -fast or irregular heartbeat  -hallucinations, loss of contact with reality  -increased blood pressure  -redness, blistering, peeling or loosening of the skin, including inside the mouth  -seizures  -suicidal thoughts or other mood changes  -unusually weak or tired  -vomiting  Side effects that usually do not require medical attention (report to your doctor or health care professional if they continue or are bothersome):  -constipation  -headache  -loss of  appetite  -nausea  -tremors  -weight loss  This list may not describe all possible side effects. Call your doctor for medical advice about side effects. You may report side effects to FDA at 1-800-FDA-1088.  Where should I keep my medicine?  Keep out of the reach of children.  Store at room temperature between 20 and 25 degrees C (68 and 77 degrees F), away from direct sunlight and moisture. Keep tightly closed. Throw away any unused medicine after the expiration date.  NOTE: This sheet is a summary. It may not cover all possible information. If you have questions about this medicine, talk to your doctor, pharmacist, or health care provider.  © 2019 [employer] (2015-10-07 13:44:21)

## 2018-05-06 NOTE — Progress Notes (Signed)
[facility] MD OP Progress Note  05/06/2018 5:17 PM Traci Davis  MRN:  [id number]  Chief Complaint: ' I am here for follow up." Chief Complaint    Follow-up     HPI: Traci Davis is a 48 yr old caucasian female , lives in [place] , on [place], never been married , has a history of depression, generlaized anxiety disorder, insomnia, cerebral palsy , DM, presented to the clinic today for a folow up visit.  Pt today reports she has been struggling with some low energy , lack of motivation and hypersomnia. Pt reports she has been compliant with her medications as prescribed . She denies side effects to medications.  Pt reports sleep as good on Rozerem at night.  Patient continues to have good support system from her boyfriend.  She reports the relationship is getting better.  Patient denies any suicidality.  Patient denies any perceptual disturbances.  Discuss changing her medications today to address her tiredness during the day.  Also discussed referral for sleep study.  She reports she had a sleep study done 5 years ago which was normal.  She also has COPD which could also be contributing to some of her problems.   Visit Diagnosis:    ICD-10-CM   1. MDD (major depressive disorder), recurrent episode, mild (HCC) F33.0 buPROPion (WELLBUTRIN) 100 MG tablet  2. GAD (generalized anxiety disorder) F41.1   3. Insomnia due to mental condition F51.05     Past Psychiatric History: I have reviewed past psychiatric history from my progress note on 09/12/2017.  Past trials of Xanax, Klonopin, Cymbalta.  Past Medical History:  Past Medical History:  Diagnosis Date  . Anxiety   . Arthritis   . Asthma   . Cerebral palsy ([place])   . Complication of anesthesia    panic attacks before surgery  . COPD (chronic obstructive pulmonary disease) ([place])   . Depression   . Diabetes mellitus without complication ([place])   . GERD (gastroesophageal reflux disease)   . Hypertension     Past Surgical History:  Procedure  Laterality Date  . CESAREAN SECTION  1995  . CHOLECYSTECTOMY    . DILATATION & CURETTAGE/HYSTEROSCOPY WITH MYOSURE N/A 02/20/2018   Procedure: DILATATION & CURETTAGE/HYSTEROSCOPY WITH MYOSURE ENDOMETRIAL POLYPECTOMY;  Surgeon: Will Bonnet, MD;  Location: [facility] ORS;  Service: Gynecology;  Laterality: N/A;  . FOOT CAPSULE RELEASE W/ PERCUTANEOUS HEEL CORD LENGTHENING, TIBIAL TENDON TRANSFER     age 18 ,and 2010-both legs  . JOINT REPLACEMENT Right    both knees partial replacements dates unknown  . knee replacment     x 5 per pt. last one- left knee 2014. has had 2 on R 3 on L  . TYMPANOPLASTY WITH GRAFT Right 01/08/2018   Procedure: TYMPANOPLASTY WITH POSSIBLE OSSICULAR CHAIN RECONSTRUCTION;  Surgeon: [name], MD;  Location: [facility] ORS;  Service: ENT;  Laterality: Right;    Family Psychiatric History: I have reviewed family psychiatric history from my progress note on 09/12/2017.  Family History:  Family History  Problem Relation Age of Onset  . CAD Father   . Heart attack Brother   . Sexual abuse Paternal Grandfather   . Breast cancer Neg Hx     Social History: Reviewed social history from my progress note on 09/12/2017. Social History   Socioeconomic History  . Marital status: Single    Spouse name: Not on file  . Number of children: Not on file  . Years of education: Not on file  .  Highest education level: Not on file  Occupational History  . Not on file  Social Needs  . Financial resource strain: Not on file  . Food insecurity:    Worry: Not on file    Inability: Not on file  . Transportation needs:    Medical: Not on file    Non-medical: Not on file  Tobacco Use  . Smoking status: Never Smoker  . Smokeless tobacco: Never Used  Substance and Sexual Activity  . Alcohol use: No  . Drug use: No  . Sexual activity: Yes    Partners: Male    Birth control/protection: Condom  Lifestyle  . Physical activity:    Days per week: Not on file    Minutes per  session: Not on file  . Stress: Not on file  Relationships  . Social connections:    Talks on phone: Not on file    Gets together: Not on file    Attends religious service: Not on file    Active member of club or organization: Not on file    Attends meetings of clubs or organizations: Not on file    Relationship status: Not on file  Other Topics Concern  . Not on file  Social History Narrative  . Not on file    Allergies:  Allergies  Allergen Reactions  . Meloxicam Other (See Comments)    Damage kidney  . Morphine And Related Other (See Comments)    hallucinations  . Vantin [Cefpodoxime] Nausea And Vomiting  . Latex Itching  . Baclofen Other (See Comments)    "makes cerebral palsy do adverse reaction on me", tightens muscles   . Ciprofloxacin Itching and Nausea And Vomiting  . Tape Rash    skin tears.  Paper tape is ok    Metabolic Disorder Labs: Lab Results  Component Value Date   HGBA1C 6.1 (H) 02/19/2015   No results found for: PROLACTIN No results found for: CHOL, TRIG, HDL, CHOLHDL, VLDL, LDLCALC Lab Results  Component Value Date   TSH 2.198 07/10/2016   TSH 2.891 02/19/2015    Therapeutic Level Labs: No results found for: LITHIUM No results found for: VALPROATE No components found for:  CBMZ  Current Medications: Current Outpatient Medications  Medication Sig Dispense Refill  . albuterol (PROVENTIL HFA;VENTOLIN HFA) 108 (90 BASE) MCG/ACT inhaler Inhale 2 puffs into the lungs 4 (four) times daily as needed. For shortness of breath and/or wheezing    . Blood Glucose Monitoring Suppl (GLUCOCOM BLOOD GLUCOSE MONITOR) DEVI Test daily before all meals/snacks and once before bedtime.    . chlorzoxazone (PARAFON) 500 MG tablet Take 500 mg by mouth 3 (three) times daily as needed for muscle spasms.    . Cholecalciferol (VITAMIN D3) 5000 units CAPS Take 5,000 Units by mouth daily.    Marland Kitchen CREON 36000 units CPEP capsule     . diclofenac sodium (VOLTAREN) 1 % GEL  Apply 1 application topically as needed for pain.    Marland Kitchen donepezil (ARICEPT) 10 MG tablet Take 10 mg by mouth at bedtime.     Marland Kitchen FLUoxetine (PROZAC) 40 MG capsule Take 1 capsule (40 mg total) by mouth daily. 90 capsule 1  . furosemide (LASIX) 40 MG tablet Take 40 mg by mouth daily as needed for fluid.     Marland Kitchen glimepiride (AMARYL) 4 MG tablet Take 4 mg by mouth daily with breakfast.    . glucose blood (ONE TOUCH ULTRA TEST) test strip USE TO TEST BLOOD SUGAR TWO TIMES  A DAY    . HYDROmorphone (DILAUDID) 2 MG tablet Take 2 mg by mouth 3 (three) times daily as needed for severe pain.    Marland Kitchen incobotulinumtoxinA (XEOMIN) 100 units SOLR injection Inject 100 Units into the muscle every 3 (three) months.     . Lancets (FREESTYLE) lancets Test daily before all meals/snacks    . nystatin (MYCOSTATIN/NYSTOP) powder Apply 1 g topically daily as needed (rash).     Marland Kitchen ofloxacin (OCUFLOX) 0.3 % ophthalmic solution Place 4 drops into the right ear 2 (two) times daily. 5 mL 0  . omeprazole (PRILOSEC) 40 MG capsule Take 40 mg by mouth daily.     . pioglitazone (ACTOS) 45 MG tablet Take 45 mg by mouth daily.    . polyethylene glycol (MIRALAX / GLYCOLAX) packet Take 17 gm mixed in liqiud as needed for constipation    . pregabalin (LYRICA) 25 MG capsule Take 50 mg by mouth at bedtime.    . ramelteon (ROZEREM) 8 MG tablet Take 1 tablet (8 mg total) by mouth at bedtime. 90 tablet 1  . solifenacin (VESICARE) 10 MG tablet Take 10 mg by mouth daily.    Marland Kitchen topiramate (TOPAMAX) 100 MG tablet Take 100 mg by mouth 2 (two) times daily.     Marland Kitchen buPROPion (WELLBUTRIN) 100 MG tablet Take 1 tablet (100 mg total) by mouth every morning. 90 tablet 0  . hydrOXYzine (VISTARIL) 25 MG capsule Take 1 capsule (25 mg total) by mouth 3 (three) times daily as needed for anxiety. Only for severe anxiety attacks (Patient not taking: Reported on 05/06/2018) 270 capsule 1  . lisinopril (PRINIVIL,ZESTRIL) 20 MG tablet Take 20 mg by mouth daily.    .  nitrofurantoin (MACRODANTIN) 100 MG capsule     . Tiotropium Bromide Monohydrate (SPIRIVA RESPIMAT) 1.25 MCG/ACT AERS Inhale 1 puff into the lungs daily.     No current facility-administered medications for this visit.      Musculoskeletal: Strength & Muscle Tone: within normal limits Gait & Station: normal Patient leans: N/A  Psychiatric Specialty Exam: Review of Systems  Psychiatric/Behavioral: Negative for depression. The patient is not nervous/anxious.   All other systems reviewed and are negative.   Blood pressure 123/70, pulse 66, height 5\' 2"  (1.575 m), weight 229 lb (103.9 kg), last menstrual period 08/20/2014.Body mass index is 41.88 kg/m.  General Appearance: Casual  Eye Contact:  Fair  Speech:  Clear and Coherent  Volume:  Normal  Mood:  Euthymic  Affect:  Appropriate  Thought Process:  Goal Directed and Descriptions of Associations: Intact  Orientation:  Full (Time, Place, and Person)  Thought Content: Logical   Suicidal Thoughts:  No  Homicidal Thoughts:  No  Memory:  Immediate;   Fair Recent;   Fair Remote;   Fair  Judgement:  Fair  Insight:  Fair  Psychomotor Activity:  Normal  Concentration:  Concentration: Fair and Attention Span: Fair  Recall:  [employer] of Knowledge: Fair  Language: Fair  Akathisia:  No  Handed:  Right  AIMS (if indicated): 0  Assets:  Communication Skills Desire for Improvement Social Support  ADL's:  Intact  Cognition: WNL  Sleep:  Fair   Screenings:   Assessment and Plan: Teniyah is a 48 year old Caucasian female with history of depression, chronic pain, diabetes mellitus, MCI, cerebral palsy, presented to clinic today for a follow-up visit.  Patient continues to have some depressive symptoms as noted above.  We will continue to make medication changes.  Plan  For depression-worsening Continue Prozac 40 mg p.o. daily Discontinue Abilify 5 mg for lack of efficacy Start Wellbutrin 100 mg p.o. daily in the morning. PHQ 9  equals 9  For GAD Prozac 40 mg p.o. daily with breakfast Hydroxyzine as prescribed GAD 7 equals 3  For insomnia Rozerem 8 mg p.o. nightly.  Patient will continue to follow-up with Ms. Peacock for CBT.  Also discussed possible referral for sleep study, however we will continue to monitor her symptoms prior to doing so .  Follow-up in clinic in 3 to 4 weeks or sooner if needed.  I have spent atleast 15 minutes face to face with patient today. More than 50 % of the time was spent for psychoeducation and supportive psychotherapy and care coordination.  This note was generated in part or whole with voice recognition software. Voice recognition is usually quite accurate but there are transcription errors that can and very often do occur. I apologize for any typographical errors that were not detected and corrected.        Ursula Alert, MD 05/06/2018, 5:17 PM

## 2018-05-15 NOTE — Progress Notes (Signed)
   THERAPIST PROGRESS NOTE  Session Time: 60 min  Participation Level: Active  Behavioral Response: CasualAlertDepressed  Type of Therapy: Individual Therapy  Treatment Goals addressed: Coping  Interventions: Solution Focused  Summary: Traci Davis is a 48 y.o. female who presents with continued symptoms of diagnosis.  Therapist engaged Patient in discussion about her life and what is going well for her. Therapist provided support for Patient as she shared details about her life, her current stressors, mood, coping skills, her past, and her children.  Therapist prompted Patient to discuss her support system and ways that she manages her daily stress, anger, and frustrations.  Therapist and Patient discussed education, long term goals and short term goals.  Therapist encouraged Patient to focus on her strengths and the positive aspects of her life versus the opposite.  Therapist assisted Patient with this task by guiding her through a short list of tasks that she is wants to do. Therapist praised Patient for her effort and encouraged her to go forward in pointing out the positive things about self. Therapist shifted focus of discussion of how she is going to move forward with her relationship or separation.  Therapist and Patient discussed the pros and cons of staying in her relationship. Therapist stressed the importance of mood stabilization through learned coping skills.  Suicidal/Homicidal: No  Plan: Return again in 2 weeks.  Diagnosis: Axis I: Depression    Axis II: No diagnosis    Lubertha South, LCSW 03/26/2018

## 2018-05-20 ENCOUNTER — Other Ambulatory Visit: Payer: Self-pay | Admitting: Specialist

## 2018-05-20 ENCOUNTER — Other Ambulatory Visit (HOSPITAL_COMMUNITY): Payer: Self-pay | Admitting: Specialist

## 2018-05-20 DIAGNOSIS — R9389 Abnormal findings on diagnostic imaging of other specified body structures: Secondary | ICD-10-CM

## 2018-05-21 ENCOUNTER — Other Ambulatory Visit: Payer: Self-pay | Admitting: Specialist

## 2018-05-21 DIAGNOSIS — R9389 Abnormal findings on diagnostic imaging of other specified body structures: Secondary | ICD-10-CM

## 2018-06-03 ENCOUNTER — Ambulatory Visit: Payer: Medicare Other | Admitting: Licensed Clinical Social Worker

## 2018-06-03 ENCOUNTER — Ambulatory Visit: Payer: Medicare Other | Admitting: Psychiatry

## 2018-06-04 ENCOUNTER — Emergency Department
Admission: EM | Admit: 2018-06-04 | Discharge: 2018-06-04 | Disposition: A | Discharge status: Home / Self Care | Payer: Medicare Other | Attending: Emergency Medicine | Admitting: Emergency Medicine

## 2018-06-04 ENCOUNTER — Other Ambulatory Visit: Payer: Self-pay

## 2018-06-04 ENCOUNTER — Ambulatory Visit: Payer: Medicare Other

## 2018-06-04 DIAGNOSIS — Z7984 Long term (current) use of oral hypoglycemic drugs: Secondary | ICD-10-CM | POA: Diagnosis not present

## 2018-06-04 DIAGNOSIS — Z79899 Other long term (current) drug therapy: Secondary | ICD-10-CM | POA: Diagnosis not present

## 2018-06-04 DIAGNOSIS — F419 Anxiety disorder, unspecified: Secondary | ICD-10-CM | POA: Insufficient documentation

## 2018-06-04 DIAGNOSIS — J449 Chronic obstructive pulmonary disease, unspecified: Secondary | ICD-10-CM | POA: Diagnosis not present

## 2018-06-04 DIAGNOSIS — Z96651 Presence of right artificial knee joint: Secondary | ICD-10-CM | POA: Insufficient documentation

## 2018-06-04 DIAGNOSIS — I10 Essential (primary) hypertension: Secondary | ICD-10-CM | POA: Diagnosis not present

## 2018-06-04 DIAGNOSIS — E119 Type 2 diabetes mellitus without complications: Secondary | ICD-10-CM | POA: Insufficient documentation

## 2018-06-04 DIAGNOSIS — G809 Cerebral palsy, unspecified: Secondary | ICD-10-CM | POA: Diagnosis not present

## 2018-06-04 DIAGNOSIS — R079 Chest pain, unspecified: Secondary | ICD-10-CM | POA: Diagnosis present

## 2018-06-04 DIAGNOSIS — J45909 Unspecified asthma, uncomplicated: Secondary | ICD-10-CM | POA: Insufficient documentation

## 2018-06-04 DIAGNOSIS — Z91040 Latex allergy status: Secondary | ICD-10-CM | POA: Diagnosis not present

## 2018-06-04 LAB — TROPONIN I: Troponin I: <0.03 ng/mL (ref <0.03)

## 2018-06-04 LAB — CBC
HCT: 40 % (ref 36.0–46.0)
Hemoglobin: 12.2 g/dL (ref 12.0–15.0)
MCH: 27.6 pg (ref 26.0–34.0)
MCHC: 30.5 g/dL (ref 30.0–36.0)
MCV: 90.5 fL (ref 80.0–100.0)
Platelets: 285 10*3/uL (ref 150–400)
RBC: 4.42 MIL/uL (ref 3.87–5.11)
RDW: 14.6 % (ref 11.5–15.5)
WBC: 10 10*3/uL (ref 4.0–10.5)
nRBC: 0 % (ref 0.0–0.2)

## 2018-06-04 LAB — COMPREHENSIVE METABOLIC PANEL
ALT: 22 U/L (ref 0–44)
AST: 15 U/L (ref 15–41)
Albumin: 3.8 g/dL (ref 3.5–5.0)
Alkaline Phosphatase: 78 U/L (ref 38–126)
Anion gap: 7 (ref 5–15)
BUN: 8 mg/dL (ref 6–20)
CO2: 24 mmol/L (ref 22–32)
Calcium: 8.7 mg/dL — ABNORMAL LOW (ref 8.9–10.3)
Chloride: 109 mmol/L (ref 98–111)
Creatinine, Ser: 0.99 mg/dL (ref 0.44–1.00)
GFR calc Af Amer: >60 mL/min (ref >60)
GFR calc non Af Amer: >60 mL/min (ref >60)
Glucose, Bld: 85 mg/dL (ref 70–99)
Potassium: 3.4 mmol/L — ABNORMAL LOW (ref 3.5–5.1)
Sodium: 140 mmol/L (ref 135–145)
Total Bilirubin: 0.5 mg/dL (ref 0.3–1.2)
Total Protein: 6.8 g/dL (ref 6.5–8.1)

## 2018-06-04 MED ORDER — LORAZEPAM 1 MG PO TABS
1.0000 mg | ORAL_TABLET | Freq: Once | ORAL | Status: AC
  Administered 2018-06-04: 1 mg via ORAL
  Filled 2018-06-04: qty 1

## 2018-06-04 NOTE — ED Provider Notes (Signed)
[facility] Emergency Department Provider Note  ____________________________________________   I have reviewed the triage vital signs and the nursing notes.   HISTORY  Chief Complaint Chest Pain   History limited by: Not Limited   HPI Traci Davis is a 48 y.o. female who presents to the emergency department today because of concerns for chest pain.  Patient states it is been present for the past 4 days.  Is located in the left chest.  She states that it feels like a fluttering.  She denies any pressure type pain.  She has not had any shortness of breath although feels like the pain is slightly worse when she takes deep breaths.  The patient states that she thinks that it might be secondary to anxiety.  Patient states she has been under a lot of stress over the past week.  She has had issues with previous relationships and family issues.  Patient states that she had not been taking her hydralazine as prescribed until today.  Patient states she has been taking her Prozac.  Per medical record review patient has a history of anxiety  Past Medical History:  Diagnosis Date  . Anxiety   . Arthritis   . Asthma   . Cerebral palsy ([place])   . Complication of anesthesia    panic attacks before surgery  . COPD (chronic obstructive pulmonary disease) ([place])   . Depression   . Diabetes mellitus without complication ([place])   . GERD (gastroesophageal reflux disease)   . Hypertension     Patient Active Problem List   Diagnosis Date Noted  . Disorder of vein 03/04/2018  . Lumbar sprain 03/04/2018  . Muscle weakness 03/04/2018  . Neck sprain 03/04/2018  . 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  . Postmenopausal bleeding 02/18/2018  . Impingement syndrome of shoulder region 02/04/2018  . Chest pain with moderate risk for cardiac etiology 05/19/2017  . History of depression 04/15/2017  . Generalized anxiety disorder 04/15/2017  . Chronic daily headache 04/15/2017  . Panic  attack 04/15/2017  . Nocturnal enuresis 04/15/2017  . Mild cognitive impairment 04/15/2017  . Loss of memory 01/14/2017  . Pain medication agreement signed 01/08/2017  . Headache disorder 12/04/2016  . Chronic, continuous use of opioids 07/01/2015  . Vertigo 07/01/2015  . Chronic midline low back pain without sciatica 03/21/2015  . Acute renal failure (ARF) ([place]) 02/19/2015  . Type 2 diabetes mellitus without complication ([place]) 27/09/2374  . Acute pancreatitis 09/04/2014  . Cerebral palsy ([place]) 01/05/2014  . 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 Hip pain, chronic 04/28/2013  . Essential hypertension 01/10/2013  . Irritable colon 01/10/2013  . Noninfectious gastroenteritis and colitis 01/10/2013  . Encounter for long-term (current) use of other medications 12/04/2012  . Esophageal reflux 08/12/2012  . Epigastric pain 08/12/2012  . Diarrhea 08/12/2012  . Primary localized osteoarthrosis, lower leg 05/07/2012  . Difficulty walking 02/06/2012    Past Surgical History:  Procedure Laterality Date  . CESAREAN SECTION  1995  . CHOLECYSTECTOMY    . DILATATION & CURETTAGE/HYSTEROSCOPY WITH MYOSURE N/A 02/20/2018   Procedure: DILATATION & CURETTAGE/HYSTEROSCOPY WITH MYOSURE ENDOMETRIAL POLYPECTOMY;  Surgeon: Will Bonnet, MD;  Location: [facility] ORS;  Service: Gynecology;  Laterality: N/A;  . FOOT CAPSULE RELEASE W/ PERCUTANEOUS HEEL CORD LENGTHENING, TIBIAL TENDON TRANSFER     age 53 ,and 2010-both legs  . JOINT REPLACEMENT Right    both knees partial replacements dates unknown  . knee replacment     x  5 per pt. last one- left knee 2014. has had 2 on R 3 on L  . TYMPANOPLASTY WITH GRAFT Right 01/08/2018   Procedure: TYMPANOPLASTY WITH POSSIBLE OSSICULAR CHAIN RECONSTRUCTION;  Surgeon: [name], MD;  Location: [facility] ORS;  Service: ENT;  Laterality: Right;    Prior to Admission medications   Medication Sig Start Date End Date Taking? Authorizing Provider   albuterol (PROVENTIL HFA;VENTOLIN HFA) 108 (90 BASE) MCG/ACT inhaler Inhale 2 puffs into the lungs 4 (four) times daily as needed. For shortness of breath and/or wheezing 09/22/14   [provider]  Blood Glucose Monitoring Suppl (GLUCOCOM BLOOD GLUCOSE MONITOR) DEVI Test daily before all meals/snacks and once before bedtime. 09/07/14   [provider]  buPROPion (WELLBUTRIN) 100 MG tablet Take 1 tablet (100 mg total) by mouth every morning. 05/06/18   Ursula Alert, MD  chlorzoxazone (PARAFON) 500 MG tablet Take 500 mg by mouth 3 (three) times daily as needed for muscle spasms.    [provider]  Cholecalciferol (VITAMIN D3) 5000 units CAPS Take 5,000 Units by mouth daily.    [provider]  CREON 36000 units CPEP capsule  03/26/18   [provider]  diclofenac sodium (VOLTAREN) 1 % GEL Apply 1 application topically as needed for pain. 12/09/17   [provider]  donepezil (ARICEPT) 10 MG tablet Take 10 mg by mouth at bedtime.  05/21/17   [provider]  FLUoxetine (PROZAC) 40 MG capsule Take 1 capsule (40 mg total) by mouth daily. 03/04/18   Ursula Alert, MD  furosemide (LASIX) 40 MG tablet Take 40 mg by mouth daily as needed for fluid.     [provider]  glimepiride (AMARYL) 4 MG tablet Take 4 mg by mouth daily with breakfast.    [provider]  glucose blood (ONE TOUCH ULTRA TEST) test strip USE TO TEST BLOOD SUGAR TWO TIMES A DAY 12/07/14   [provider]  HYDROmorphone (DILAUDID) 2 MG tablet Take 2 mg by mouth 3 (three) times daily as needed for severe pain.    [provider]  hydrOXYzine (VISTARIL) 25 MG capsule Take 1 capsule (25 mg total) by mouth 3 (three) times daily as needed for anxiety. Only for severe anxiety attacks Patient not taking: Reported on 05/06/2018 03/04/18   Ursula Alert, MD  incobotulinumtoxinA (XEOMIN) 100 units SOLR injection Inject 100 Units into the muscle every 3  (three) months.  03/29/15   [provider]  Lancets (FREESTYLE) lancets Test daily before all meals/snacks 09/07/14   [provider]  lisinopril (PRINIVIL,ZESTRIL) 20 MG tablet Take 20 mg by mouth daily.    [provider]  nitrofurantoin (MACRODANTIN) 100 MG capsule  03/04/18   [provider]  nystatin (MYCOSTATIN/NYSTOP) powder Apply 1 g topically daily as needed (rash).  04/10/13   [provider]  ofloxacin (OCUFLOX) 0.3 % ophthalmic solution Place 4 drops into the right ear 2 (two) times daily. 01/08/18   [name], MD  omeprazole (PRILOSEC) 40 MG capsule Take 40 mg by mouth daily.     [provider]  pioglitazone (ACTOS) 45 MG tablet Take 45 mg by mouth daily.    [provider]  polyethylene glycol (MIRALAX / GLYCOLAX) packet Take 17 gm mixed in liqiud as needed for constipation 09/28/15   [provider]  pregabalin (LYRICA) 25 MG capsule Take 50 mg by mouth at bedtime.    [provider]  ramelteon (ROZEREM) 8 MG tablet Take 1  tablet (8 mg total) by mouth at bedtime. 03/04/18   Ursula Alert, MD  solifenacin (VESICARE) 10 MG tablet Take 10 mg by mouth daily.    [provider]  Tiotropium Bromide Monohydrate (SPIRIVA RESPIMAT) 1.25 MCG/ACT AERS Inhale 1 puff into the lungs daily.    [provider]  topiramate (TOPAMAX) 100 MG tablet Take 100 mg by mouth 2 (two) times daily.  07/17/16   [provider]    Allergies Meloxicam; Morphine and related; Vantin [cefpodoxime]; Latex; Baclofen; Ciprofloxacin; and Tape  Family History  Problem Relation Age of Onset  . CAD Father   . Heart attack Brother   . Sexual abuse Paternal Grandfather   . Breast cancer Neg Hx     Social History Social History   Tobacco Use  . Smoking status: Never Smoker  . Smokeless tobacco: Never Used  Substance Use Topics  . Alcohol use: No  . Drug use: No    Review of Systems Constitutional:  No fever/chills Eyes: No visual changes. ENT: No sore throat. Cardiovascular: Positive for chest pain. Respiratory: Denies shortness of breath. Gastrointestinal: No abdominal pain.  No nausea, no vomiting.  No diarrhea.   Genitourinary: Negative for dysuria. Musculoskeletal: Negative for back pain. Skin: Negative for rash. Neurological: Negative for headaches, focal weakness or numbness.  ____________________________________________   PHYSICAL EXAM:  VITAL SIGNS: ED Triage Vitals  Enc Vitals Group     BP 06/04/18 2000 119/68     Pulse Rate 06/04/18 2000 65     Resp 06/04/18 2000 20     Temp 06/04/18 2000 98 F (36.7 C)     Temp Source 06/04/18 2000 Oral     SpO2 06/04/18 2000 98 %     Weight 06/04/18 1957 227 lb (103 kg)     Height 06/04/18 1957 5\' 2"  (1.575 m)     Head Circumference --      Peak Flow --      Pain Score 06/04/18 1957 8   Constitutional: Alert and oriented.  Eyes: Conjunctivae are normal.  ENT      Head: Normocephalic and atraumatic.      Nose: No congestion/rhinnorhea.      Mouth/Throat: Mucous membranes are moist.      Neck: No stridor. Hematological/Lymphatic/Immunilogical: No cervical lymphadenopathy. Cardiovascular: Normal rate, regular rhythm.  No murmurs, rubs, or gallops.  Respiratory: Normal respiratory effort without tachypnea nor retractions. Breath sounds are clear and equal bilaterally. No wheezes/rales/rhonchi. Gastrointestinal: Soft and non tender. No rebound. No guarding.  Genitourinary: Deferred Musculoskeletal: Normal range of motion in all extremities. No lower extremity edema. Neurologic:  Normal speech and language. No gross focal neurologic deficits are appreciated.  Skin:  Skin is warm, dry and intact. No rash noted. Psychiatric: Mood and affect are normal. Speech and behavior are normal. Patient exhibits appropriate insight and judgment.  ____________________________________________    LABS (pertinent  positives/negatives)  Trop <0.03 CBC wbc 10.0, hgb 12.2, plt 285 CMP wnl except k 3.4, ca 8.7  ____________________________________________   EKG  I, Nance Pear, attending physician, personally viewed and interpreted this EKG  EKG Time: 1957 Rate: 67 Rhythm: sinus rhythm with short pr Axis: normal Intervals: qtc 458 QRS: narrow ST changes: no st elevation Impression: short pr otherwise normal  ___________________________________________   PROCEDURES  Procedures  ____________________________________________   INITIAL IMPRESSION / ASSESSMENT AND PLAN / ED COURSE  Pertinent labs & imaging results that were available during my care of the patient were reviewed by me  and considered in my medical decision making (see chart for details).   Patient presents to the emergency department today because of concern for chest pain and anxiety. Blood work and ekg without concerning findings. Patient does have a significant anxiety history. Think anxiety likely. Will give dose of anti anxiety medication here. Discussed with patient importance of taking home medication.  ____________________________________________   FINAL CLINICAL IMPRESSION(S) / ED DIAGNOSES  Final diagnoses:  Nonspecific chest pain  Anxiety     Note: This dictation was prepared with Dragon dictation. Any transcriptional errors that result from this process are unintentional     Nance Pear, MD 06/04/18 2320

## 2018-06-04 NOTE — ED Triage Notes (Signed)
Pt in with co chest pain x 4 days states hx of panic attacks and feels the same. STates she ran out of anxiety meds recently.

## 2018-06-04 NOTE — Discharge Instructions (Addendum)
Please seek medical attention for any high fevers, chest pain, shortness of breath, change in behavior, persistent vomiting, bloody stool or any other new or concerning symptoms.  

## 2018-06-10 ENCOUNTER — Ambulatory Visit: Payer: Medicare Other | Admitting: Physical Therapy

## 2018-06-12 ENCOUNTER — Ambulatory Visit: Payer: Medicare Other

## 2018-06-17 ENCOUNTER — Ambulatory Visit: Payer: Medicare Other

## 2018-06-19 ENCOUNTER — Ambulatory Visit: Payer: Medicare Other

## 2018-06-24 ENCOUNTER — Ambulatory Visit: Payer: Medicare Other

## 2018-06-24 ENCOUNTER — Ambulatory Visit: Payer: Medicare Other | Admitting: Physical Therapy

## 2018-06-25 ENCOUNTER — Ambulatory Visit: Payer: Medicare Other | Admitting: Licensed Clinical Social Worker

## 2018-06-26 ENCOUNTER — Ambulatory Visit: Payer: Medicare Other

## 2018-06-30 ENCOUNTER — Other Ambulatory Visit: Payer: Self-pay

## 2018-06-30 ENCOUNTER — Ambulatory Visit (INDEPENDENT_AMBULATORY_CARE_PROVIDER_SITE_OTHER): Payer: Medicare Other | Admitting: Psychiatry

## 2018-06-30 ENCOUNTER — Encounter: Payer: Self-pay | Admitting: Psychiatry

## 2018-06-30 VITALS — BP 122/76 | HR 60 | Temp 98.0°F

## 2018-06-30 DIAGNOSIS — F5105 Insomnia due to other mental disorder: Secondary | ICD-10-CM | POA: Diagnosis not present

## 2018-06-30 DIAGNOSIS — F411 Generalized anxiety disorder: Secondary | ICD-10-CM

## 2018-06-30 DIAGNOSIS — F330 Major depressive disorder, recurrent, mild: Secondary | ICD-10-CM | POA: Diagnosis not present

## 2018-06-30 MED ORDER — TRAZODONE HCL 50 MG PO TABS: 25.0000 mg | ORAL_TABLET | Freq: Every day | ORAL | 0 refills | Status: DC

## 2018-06-30 NOTE — Progress Notes (Signed)
[place] MD OP Progress Note  06/30/2018 5:46 PM Traci Davis  MRN:  [id number]  Chief Complaint: ' I am here for follow up." Chief Complaint    Follow-up; Medication Refill     HPI: Traci Davis is a 48 year old Caucasian female, lives in [place], on [place], has a history of depression, generalized anxiety disorder, insomnia, cerebral palsy, diabetes mellitus, presented to clinic today for a follow-up visit.  Patient today reports she continues to struggle with some relationship stressors.  She reports she broke up with her ex-boyfriend Traci Davis.  She however continues to keep in touch with him.  He brought her for this appointment today.  She reports she is back with her previous boyfriend waned at this time.  She continues to be confused about how she wants to continue with and that is a stressor for her.  Discussed with patient to continue psychotherapy session and discussed this in therapy with Ms. Peacock.  Patient reports her health insurance plan will not cover her Rozerem.  It did help her with her sleep.  She would like to change it to something else.  Discussed starting trazodone.  She agrees with plan.  Patient denies any other concerns today. Visit Diagnosis:    ICD-10-CM   1. MDD (major depressive disorder), recurrent episode, mild ([place]) F33.0   2. GAD (generalized anxiety disorder) F41.1   3. Insomnia due to mental condition F51.05 traZODone (DESYREL) 50 MG tablet    Past Psychiatric History: Reviewed past psychiatric history from my progress note on 09/12/2017.  Past trials of Xanax, Klonopin, Cymbalta, Rozerem-costly  Past Medical History:  Past Medical History:  Diagnosis Date  . Anxiety   . Arthritis   . Asthma   . Cerebral palsy ([place])   . Complication of anesthesia    panic attacks before surgery  . COPD (chronic obstructive pulmonary disease) ([place])   . Depression   . Diabetes mellitus without complication ([place])   . GERD (gastroesophageal reflux disease)   . Hypertension      Past Surgical History:  Procedure Laterality Date  . CESAREAN SECTION  1995  . CHOLECYSTECTOMY    . DILATATION & CURETTAGE/HYSTEROSCOPY WITH MYOSURE N/A 02/20/2018   Procedure: DILATATION & CURETTAGE/HYSTEROSCOPY WITH MYOSURE ENDOMETRIAL POLYPECTOMY;  Surgeon: Will Bonnet, MD;  Location: [facility] ORS;  Service: Gynecology;  Laterality: N/A;  . FOOT CAPSULE RELEASE W/ PERCUTANEOUS HEEL CORD LENGTHENING, TIBIAL TENDON TRANSFER     age 35 ,and 2010-both legs  . JOINT REPLACEMENT Right    both knees partial replacements dates unknown  . knee replacment     x 5 per pt. last one- left knee 2014. has had 2 on R 3 on L  . TYMPANOPLASTY WITH GRAFT Right 01/08/2018   Procedure: TYMPANOPLASTY WITH POSSIBLE OSSICULAR CHAIN RECONSTRUCTION;  Surgeon: [name], MD;  Location: [facility] ORS;  Service: ENT;  Laterality: Right;    Family Psychiatric History: Reviewed family psychiatric history from my progress note on 09/12/2017 Family History:  Family History  Problem Relation Age of Onset  . CAD Father   . Heart attack Brother   . Sexual abuse Paternal Grandfather   . Breast cancer Neg Hx     Social History: Reviewed social history from my progress note on 09/12/2017 Social History   Socioeconomic History  . Marital status: Single    Spouse name: Not on file  . Number of children: Not on file  . Years of education: Not on file  . Highest education level: Not  on file  Occupational History  . Not on file  Social Needs  . Financial resource strain: Not on file  . Food insecurity:    Worry: Not on file    Inability: Not on file  . Transportation needs:    Medical: Not on file    Non-medical: Not on file  Tobacco Use  . Smoking status: Never Smoker  . Smokeless tobacco: Never Used  Substance and Sexual Activity  . Alcohol use: No  . Drug use: No  . Sexual activity: Yes    Partners: Male    Birth control/protection: Condom  Lifestyle  . Physical activity:    Days per week: Not on  file    Minutes per session: Not on file  . Stress: Not on file  Relationships  . Social connections:    Talks on phone: Not on file    Gets together: Not on file    Attends religious service: Not on file    Active member of club or organization: Not on file    Attends meetings of clubs or organizations: Not on file    Relationship status: Not on file  Other Topics Concern  . Not on file  Social History Narrative  . Not on file    Allergies:  Allergies  Allergen Reactions  . Meloxicam Other (See Comments)    Damage kidney  . Morphine And Related Other (See Comments)    hallucinations  . Vantin [Cefpodoxime] Nausea And Vomiting  . Latex Itching  . Baclofen Other (See Comments)    "makes cerebral palsy do adverse reaction on me", tightens muscles   . Ciprofloxacin Itching and Nausea And Vomiting  . Tape Rash    skin tears.  Paper tape is ok    Metabolic Disorder Labs: Lab Results  Component Value Date   HGBA1C 6.1 (H) 02/19/2015   No results found for: PROLACTIN No results found for: CHOL, TRIG, HDL, CHOLHDL, VLDL, LDLCALC Lab Results  Component Value Date   TSH 2.198 07/10/2016   TSH 2.891 02/19/2015    Therapeutic Level Labs: No results found for: LITHIUM No results found for: VALPROATE No components found for:  CBMZ  Current Medications: Current Outpatient Medications  Medication Sig Dispense Refill  . albuterol (PROVENTIL HFA;VENTOLIN HFA) 108 (90 BASE) MCG/ACT inhaler Inhale 2 puffs into the lungs 4 (four) times daily as needed. For shortness of breath and/or wheezing    . Blood Glucose Monitoring Suppl (GLUCOCOM BLOOD GLUCOSE MONITOR) DEVI Test daily before all meals/snacks and once before bedtime.    Marland Kitchen buPROPion (WELLBUTRIN) 100 MG tablet Take 1 tablet (100 mg total) by mouth every morning. 90 tablet 0  . chlorzoxazone (PARAFON) 500 MG tablet Take 500 mg by mouth 3 (three) times daily as needed for muscle spasms.    . Cholecalciferol (VITAMIN D3) 5000  units CAPS Take 5,000 Units by mouth daily.    Marland Kitchen CREON 36000 units CPEP capsule     . diclofenac sodium (VOLTAREN) 1 % GEL Apply 1 application topically as needed for pain.    Marland Kitchen donepezil (ARICEPT) 10 MG tablet Take 10 mg by mouth at bedtime.     Marland Kitchen FLUoxetine (PROZAC) 40 MG capsule Take 1 capsule (40 mg total) by mouth daily. 90 capsule 1  . furosemide (LASIX) 40 MG tablet Take 40 mg by mouth daily as needed for fluid.     Marland Kitchen glimepiride (AMARYL) 4 MG tablet Take 4 mg by mouth daily with breakfast.    .  glucose blood (ONE TOUCH ULTRA TEST) test strip USE TO TEST BLOOD SUGAR TWO TIMES A DAY    . HYDROmorphone (DILAUDID) 2 MG tablet Take 2 mg by mouth 3 (three) times daily as needed for severe pain.    . hydrOXYzine (VISTARIL) 25 MG capsule Take 1 capsule (25 mg total) by mouth 3 (three) times daily as needed for anxiety. Only for severe anxiety attacks 270 capsule 1  . incobotulinumtoxinA (XEOMIN) 100 units SOLR injection Inject 100 Units into the muscle every 3 (three) months.     . Lancets (FREESTYLE) lancets Test daily before all meals/snacks    . lisinopril (PRINIVIL,ZESTRIL) 20 MG tablet Take 20 mg by mouth daily.    . nitrofurantoin (MACRODANTIN) 100 MG capsule     . nystatin (MYCOSTATIN/NYSTOP) powder Apply 1 g topically daily as needed (rash).     Marland Kitchen ofloxacin (OCUFLOX) 0.3 % ophthalmic solution Place 4 drops into the right ear 2 (two) times daily. 5 mL 0  . omeprazole (PRILOSEC) 40 MG capsule Take 40 mg by mouth daily.     . pioglitazone (ACTOS) 45 MG tablet Take 45 mg by mouth daily.    . polyethylene glycol (MIRALAX / GLYCOLAX) packet Take 17 gm mixed in liqiud as needed for constipation    . pregabalin (LYRICA) 25 MG capsule Take 50 mg by mouth at bedtime.    . solifenacin (VESICARE) 10 MG tablet Take 10 mg by mouth daily.    . Tiotropium Bromide Monohydrate (SPIRIVA RESPIMAT) 1.25 MCG/ACT AERS Inhale 1 puff into the lungs daily.    Marland Kitchen topiramate (TOPAMAX) 100 MG tablet Take 100 mg by  mouth 2 (two) times daily.     Marland Kitchen atorvastatin (LIPITOR) 10 MG tablet     . traZODone (DESYREL) 50 MG tablet Take 0.5-1 tablets (25-50 mg total) by mouth at bedtime. For sleep 30 tablet 0   No current facility-administered medications for this visit.      Musculoskeletal: Strength & Muscle Tone: within normal limits Gait & Station: in a wheelchair Patient leans: N/A  Psychiatric Specialty Exam: Review of Systems  Psychiatric/Behavioral: Positive for depression. The patient is nervous/anxious and has insomnia.   All other systems reviewed and are negative.   Blood pressure 122/76, pulse 60, temperature 98 F (36.7 C), temperature source Oral, last menstrual period 08/20/2014.There is no height or weight on file to calculate BMI.  General Appearance: Casual  Eye Contact:  Fair  Speech:  Clear and Coherent  Volume:  Normal  Mood:  Anxious and Dysphoric  Affect:  Congruent  Thought Process:  Goal Directed and Descriptions of Associations: Intact  Orientation:  Full (Time, Place, and Person)  Thought Content: Logical   Suicidal Thoughts:  No  Homicidal Thoughts:  No  Memory:  Immediate;   Fair Recent;   Fair Remote;   Fair  Judgement:  Fair  Insight:  Fair  Psychomotor Activity:  Normal  Concentration:  Concentration: Fair and Attention Span: Fair  Recall:  [employer] of Knowledge: Fair  Language: Fair  Akathisia:  No  Handed:  Right  AIMS (if indicated): denies tremors, stiffness  Assets:  Communication Skills Desire for Improvement Social Support  ADL's:  Intact  Cognition: WNL  Sleep:  Poor   Screenings:   Assessment and Plan: Natanya is a 48 yr old Caucasian female with history of depression, chronic pain, diabetes melitis, MCI, cerebral palsy, presented to clinic today for a follow-up visit.  Patient reports her sleep medication will  not be covered by her health insurance plan.  She otherwise reports her other medications is effective.  We will continue plan as  noted below.  Plan For depression- improving Prozac 40 mg p.o. daily Wellbutrin 100 mg p.o. daily in the morning  For GAD- improving Prozac as prescribed Hydroxyzine as prescribed  Insomnia- although improving her medication will not be covered by her health insurance plan. Discontinue Rozerem. Start trazodone 25 to 50 mg p.o. nightly  Discussed with patient to continue psychotherapy sessions with her therapist Ms. Peacock.  Follow-up in clinic in 3 to 4 weeks or sooner if needed.  I have spent atleast 15 minutes face to face with patient today. More than 50 % of the time was spent for psychoeducation and supportive psychotherapy and care coordination.  This note was generated in part or whole with voice recognition software. Voice recognition is usually quite accurate but there are transcription errors that can and very often do occur. I apologize for any typographical errors that were not detected and corrected.        Ursula Alert, MD 06/30/2018, 5:46 PM

## 2018-06-30 NOTE — Patient Instructions (Signed)
Trazodone tablets What is this medicine? TRAZODONE (TRAZ oh done) is used to treat depression. This medicine may be used for other purposes; ask your health care provider or pharmacist if you have questions. COMMON BRAND NAME(S): Desyrel What should I tell my health care provider before I take this medicine? They need to know if you have any of these conditions: -attempted suicide or thinking about it -bipolar disorder -bleeding problems -glaucoma -heart disease, or previous heart attack -irregular heart beat -kidney or liver disease -low levels of sodium in the blood -an unusual or allergic reaction to trazodone, other medicines, foods, dyes or preservatives -pregnant or trying to get pregnant -breast-feeding How should I use this medicine? Take this medicine by mouth with a glass of water. Follow the directions on the prescription label. Take this medicine shortly after a meal or a light snack. Take your medicine at regular intervals. Do not take your medicine more often than directed. Do not stop taking this medicine suddenly except upon the advice of your doctor. Stopping this medicine too quickly may cause serious side effects or your condition may worsen. A special MedGuide will be given to you by the pharmacist with each prescription and refill. Be sure to read this information carefully each time. Talk to your pediatrician regarding the use of this medicine in children. Special care may be needed. Overdosage: If you think you have taken too much of this medicine contact a poison control center or emergency room at once. NOTE: This medicine is only for you. Do not share this medicine with others. What if I miss a dose? If you miss a dose, take it as soon as you can. If it is almost time for your next dose, take only that dose. Do not take double or extra doses. What may interact with this medicine? Do not take this medicine with any of the following medications: -certain medicines  for fungal infections like fluconazole, itraconazole, ketoconazole, posaconazole, voriconazole -cisapride -dofetilide -dronedarone -linezolid -MAOIs like Carbex, Eldepryl, Marplan, Nardil, and Parnate -mesoridazine -methylene blue (injected into a vein) -pimozide -saquinavir -thioridazine This medicine may also interact with the following medications: -alcohol -antiviral medicines for HIV or AIDS -aspirin and aspirin-like medicines -barbiturates like phenobarbital -certain medicines for blood pressure, heart disease, irregular heart beat -certain medicines for depression, anxiety, or psychotic disturbances -certain medicines for migraine headache like almotriptan, eletriptan, frovatriptan, naratriptan, rizatriptan, sumatriptan, zolmitriptan -certain medicines for seizures like carbamazepine and phenytoin -certain medicines for sleep -certain medicines that treat or prevent blood clots like dalteparin, enoxaparin, warfarin -digoxin -fentanyl -lithium -NSAIDS, medicines for pain and inflammation, like ibuprofen or naproxen -other medicines that prolong the QT interval (cause an abnormal heart rhythm) -rasagiline -supplements like St. John's wort, kava kava, valerian -tramadol -tryptophan This list may not describe all possible interactions. Give your health care provider a list of all the medicines, herbs, non-prescription drugs, or dietary supplements you use. Also tell them if you smoke, drink alcohol, or use illegal drugs. Some items may interact with your medicine. What should I watch for while using this medicine? Tell your doctor if your symptoms do not get better or if they get worse. Visit your doctor or health care professional for regular checks on your progress. Because it may take several weeks to see the full effects of this medicine, it is important to continue your treatment as prescribed by your doctor. Patients and their families should watch out for new or worsening  thoughts of suicide or depression. Also watch   out for sudden changes in feelings such as feeling anxious, agitated, panicky, irritable, hostile, aggressive, impulsive, severely restless, overly excited and hyperactive, or not being able to sleep. If this happens, especially at the beginning of treatment or after a change in dose, call your health care professional. You may get drowsy or dizzy. Do not drive, use machinery, or do anything that needs mental alertness until you know how this medicine affects you. Do not stand or sit up quickly, especially if you are an older patient. This reduces the risk of dizzy or fainting spells. Alcohol may interfere with the effect of this medicine. Avoid alcoholic drinks. This medicine may cause dry eyes and blurred vision. If you wear contact lenses you may feel some discomfort. Lubricating drops may help. See your eye doctor if the problem does not go away or is severe. Your mouth may get dry. Chewing sugarless gum, sucking hard candy and drinking plenty of water may help. Contact your doctor if the problem does not go away or is severe. What side effects may I notice from receiving this medicine? Side effects that you should report to your doctor or health care professional as soon as possible: -allergic reactions like skin rash, itching or hives, swelling of the face, lips, or tongue -elevated mood, decreased need for sleep, racing thoughts, impulsive behavior -confusion -fast, irregular heartbeat -feeling faint or lightheaded, falls -feeling agitated, angry, or irritable -loss of balance or coordination -painful or prolonged erections -restlessness, pacing, inability to keep still -suicidal thoughts or other mood changes -tremors -trouble sleeping -seizures -unusual bleeding or bruising Side effects that usually do not require medical attention (report to your doctor or health care professional if they continue or are bothersome): -change in sex drive or  performance -change in appetite or weight -constipation -headache -muscle aches or pains -nausea This list may not describe all possible side effects. Call your doctor for medical advice about side effects. You may report side effects to FDA at 1-800-FDA-1088. Where should I keep my medicine? Keep out of the reach of children. Store at room temperature between 15 and 30 degrees C (59 to 86 degrees F). Protect from light. Keep container tightly closed. Throw away any unused medicine after the expiration date. NOTE: This sheet is a summary. It may not cover all possible information. If you have questions about this medicine, talk to your doctor, pharmacist, or health care provider.  2019 Elsevier/Gold Standard (2017-06-25 17:51:24)  

## 2018-07-01 ENCOUNTER — Ambulatory Visit: Payer: Medicare Other

## 2018-07-03 ENCOUNTER — Ambulatory Visit: Payer: Medicare Other | Attending: Physical Medicine and Rehabilitation | Admitting: Physical Therapy

## 2018-07-03 ENCOUNTER — Ambulatory Visit: Payer: Medicare Other | Admitting: Physical Therapy

## 2018-07-03 ENCOUNTER — Ambulatory Visit: Payer: Medicare Other

## 2018-07-08 ENCOUNTER — Ambulatory Visit: Payer: Medicare Other | Admitting: Physical Therapy

## 2018-07-10 ENCOUNTER — Ambulatory Visit: Payer: Medicare Other

## 2018-07-15 ENCOUNTER — Ambulatory Visit: Payer: Medicare Other | Admitting: Physical Therapy

## 2018-07-17 ENCOUNTER — Ambulatory Visit: Payer: Medicare Other

## 2018-07-21 ENCOUNTER — Ambulatory Visit: Payer: Self-pay | Admitting: Psychiatry

## 2018-07-21 ENCOUNTER — Telehealth: Payer: Self-pay | Admitting: Psychiatry

## 2018-07-21 DIAGNOSIS — F5105 Insomnia due to other mental disorder: Secondary | ICD-10-CM

## 2018-07-21 DIAGNOSIS — F330 Major depressive disorder, recurrent, mild: Secondary | ICD-10-CM

## 2018-07-21 MED ORDER — TRAZODONE HCL 50 MG PO TABS: 25.0000 mg | ORAL_TABLET | Freq: Every day | ORAL | 0 refills | Status: DC

## 2018-07-21 MED ORDER — BUPROPION HCL 100 MG PO TABS: 100.0000 mg | ORAL_TABLET | Freq: Every morning | ORAL | 0 refills | Status: DC

## 2018-07-21 NOTE — Telephone Encounter (Signed)
Sent Trazodone and wellbutrin to pharmacy

## 2018-07-22 ENCOUNTER — Encounter: Payer: Medicare Other | Admitting: Physical Therapy

## 2018-07-24 ENCOUNTER — Ambulatory Visit: Payer: Medicare Other | Admitting: Licensed Clinical Social Worker

## 2018-08-14 ENCOUNTER — Other Ambulatory Visit: Payer: Self-pay

## 2018-08-14 ENCOUNTER — Ambulatory Visit (INDEPENDENT_AMBULATORY_CARE_PROVIDER_SITE_OTHER): Payer: Medicare Other | Admitting: Licensed Clinical Social Worker

## 2018-08-14 DIAGNOSIS — F411 Generalized anxiety disorder: Secondary | ICD-10-CM | POA: Diagnosis not present

## 2018-08-14 DIAGNOSIS — F330 Major depressive disorder, recurrent, mild: Secondary | ICD-10-CM

## 2018-09-11 ENCOUNTER — Other Ambulatory Visit: Payer: Self-pay

## 2018-09-11 ENCOUNTER — Ambulatory Visit: Payer: Medicare Other | Admitting: Licensed Clinical Social Worker

## 2018-09-19 NOTE — Progress Notes (Signed)
Virtual Visit via Telephone Note  I connected with Traci Davis on 08/14/18 at  1:00 PM EDT by telephone and verified that I am speaking with the correct person using two identifiers.  Location: Patient: home Provider: office   I discussed the limitations, risks, security and privacy concerns of performing an evaluation and management service by telephone and the availability of in person appointments. I also discussed with the patient that there may be a patient responsible charge related to this service. The patient expressed understanding and agreed to proceed.       Participation Level: Active  Type of Therapy: Individual Therapy  Treatment Goals addressed: Anxiety  Interventions: CBT and Motivational Interviewing  Summary: Traci Davis is a 48 y.o. female who presents with continued symptoms.  Therapist facilitated a therapeutic activity with Patient to assist her with recognizing and identifying her negative thinking patterns and to assist with increasing her positive thinking skills. Therapist reviewed, explained and summarized with Patient all objectives learned and asked questions for clarification. Therapist reviewed and educated Patient on tools and coping skills to assist Patient with working towards achieving her treatment goals.   Suicidal/Homicidal: No  Plan: Return again in 2 weeks.  Diagnosis: Axis I: Generalized Anxiety Disorder    Axis II: No diagnosis   The patient was advised to call back or seek an in-person evaluation if the symptoms worsen or if the condition fails to improve as anticipated.  I provided 60 minutes of non-face-to-face time during this encounter.   Lubertha South, LCSW

## 2018-10-05 ENCOUNTER — Other Ambulatory Visit: Payer: Self-pay

## 2018-10-05 ENCOUNTER — Encounter: Payer: Self-pay | Admitting: Emergency Medicine

## 2018-10-05 ENCOUNTER — Emergency Department
Admission: EM | Admit: 2018-10-05 | Discharge: 2018-10-05 | Disposition: A | Discharge status: Home / Self Care | Payer: Medicare Other | Attending: Emergency Medicine | Admitting: Emergency Medicine

## 2018-10-05 ENCOUNTER — Emergency Department: Payer: Medicare Other

## 2018-10-05 DIAGNOSIS — J449 Chronic obstructive pulmonary disease, unspecified: Secondary | ICD-10-CM | POA: Insufficient documentation

## 2018-10-05 DIAGNOSIS — Z96653 Presence of artificial knee joint, bilateral: Secondary | ICD-10-CM | POA: Insufficient documentation

## 2018-10-05 DIAGNOSIS — R0789 Other chest pain: Secondary | ICD-10-CM | POA: Diagnosis present

## 2018-10-05 DIAGNOSIS — I10 Essential (primary) hypertension: Secondary | ICD-10-CM | POA: Diagnosis not present

## 2018-10-05 DIAGNOSIS — G809 Cerebral palsy, unspecified: Secondary | ICD-10-CM | POA: Diagnosis not present

## 2018-10-05 DIAGNOSIS — Z79899 Other long term (current) drug therapy: Secondary | ICD-10-CM | POA: Diagnosis not present

## 2018-10-05 DIAGNOSIS — J45909 Unspecified asthma, uncomplicated: Secondary | ICD-10-CM | POA: Insufficient documentation

## 2018-10-05 DIAGNOSIS — Z91040 Latex allergy status: Secondary | ICD-10-CM | POA: Insufficient documentation

## 2018-10-05 DIAGNOSIS — E119 Type 2 diabetes mellitus without complications: Secondary | ICD-10-CM | POA: Insufficient documentation

## 2018-10-05 LAB — COMPREHENSIVE METABOLIC PANEL
ALT: 23 U/L (ref 0–44)
AST: 17 U/L (ref 15–41)
Albumin: 3.7 g/dL (ref 3.5–5.0)
Alkaline Phosphatase: 103 U/L (ref 38–126)
Anion gap: 9 (ref 5–15)
BUN: 16 mg/dL (ref 6–20)
CO2: 24 mmol/L (ref 22–32)
Calcium: 8.9 mg/dL (ref 8.9–10.3)
Chloride: 106 mmol/L (ref 98–111)
Creatinine, Ser: 1.01 mg/dL — ABNORMAL HIGH (ref 0.44–1.00)
GFR calc Af Amer: >60 mL/min (ref >60)
GFR calc non Af Amer: >60 mL/min (ref >60)
Glucose, Bld: 92 mg/dL (ref 70–99)
Potassium: 3.5 mmol/L (ref 3.5–5.1)
Sodium: 139 mmol/L (ref 135–145)
Total Bilirubin: 0.4 mg/dL (ref 0.3–1.2)
Total Protein: 6.7 g/dL (ref 6.5–8.1)

## 2018-10-05 LAB — CBC WITH DIFFERENTIAL/PLATELET
Abs Immature Granulocytes: 0.03 10*3/uL (ref 0.00–0.07)
Basophils Absolute: 0.1 10*3/uL (ref 0.0–0.1)
Basophils Relative: 1 %
Eosinophils Absolute: 0.3 10*3/uL (ref 0.0–0.5)
Eosinophils Relative: 3 %
HCT: 41 % (ref 36.0–46.0)
Hemoglobin: 12.6 g/dL (ref 12.0–15.0)
Immature Granulocytes: 0 %
Lymphocytes Relative: 28 %
Lymphs Abs: 2.7 10*3/uL (ref 0.7–4.0)
MCH: 25.1 pg — ABNORMAL LOW (ref 26.0–34.0)
MCHC: 30.7 g/dL (ref 30.0–36.0)
MCV: 81.8 fL (ref 80.0–100.0)
Monocytes Absolute: 0.4 10*3/uL (ref 0.1–1.0)
Monocytes Relative: 4 %
Neutro Abs: 6.2 10*3/uL (ref 1.7–7.7)
Neutrophils Relative %: 64 %
Platelets: 263 10*3/uL (ref 150–400)
RBC: 5.01 MIL/uL (ref 3.87–5.11)
RDW: 16.4 % — ABNORMAL HIGH (ref 11.5–15.5)
WBC: 9.7 10*3/uL (ref 4.0–10.5)
nRBC: 0 % (ref 0.0–0.2)

## 2018-10-05 LAB — FIBRIN DERIVATIVES D-DIMER (ARMC ONLY): Fibrin derivatives D-dimer (ARMC): 295.12 ng/mL (FEU) (ref 0.00–499.00)

## 2018-10-05 LAB — TROPONIN I: Troponin I: <0.03 ng/mL (ref <0.03)

## 2018-10-05 LAB — LIPASE, BLOOD: Lipase: 19 U/L (ref 11–51)

## 2018-10-05 MED ORDER — DICLOFENAC SODIUM 1 % TD GEL: 1.0000 "application " | TRANSDERMAL | 0 refills | Status: DC | PRN

## 2018-10-05 MED ORDER — KETOROLAC TROMETHAMINE 30 MG/ML IJ SOLN
15.0000 mg | INTRAMUSCULAR | Status: AC
  Administered 2018-10-05: 15 mg via INTRAVENOUS
  Filled 2018-10-05: qty 1

## 2018-10-05 MED ORDER — ACETAMINOPHEN 325 MG PO TABS: 650.0000 mg | ORAL_TABLET | Freq: Four times a day (QID) | ORAL | 0 refills | Status: AC | PRN

## 2018-10-05 NOTE — ED Triage Notes (Signed)
Pt to ED with c/o of central chest pain that radiates under right breast. Pt also has c/o of SOB.

## 2018-10-05 NOTE — ED Notes (Signed)
MD at bedside. 

## 2018-10-05 NOTE — ED Notes (Signed)
Pt updated by this RN. Pt verbalized MD had been to bedside to talk about results and discharge. Pt denies further questions and is calling ride at this time.

## 2018-10-05 NOTE — ED Provider Notes (Signed)
[facility] Emergency Department Provider Note  ____________________________________________  Time seen: Approximately 8:14 PM  I have reviewed the triage vital signs and the nursing notes.   HISTORY  Chief Complaint Chest pain   HPI Traci Davis is a 48 y.o. female with a history of anxiety COPD cerebral palsy GERD hypertension diabetes who comes the ED complaining of central chest pain for the past 4 days radiating to left arm.  Associated with shortness of breath, hurts to breathe.  Described as aching.  Not exertional.  No cough fevers chills.  No alleviating factors.  Constant over the past 4 days.      Past Medical History:  Diagnosis Date  . Anxiety   . Arthritis   . Asthma   . Cerebral palsy ([place])   . Complication of anesthesia    panic attacks before surgery  . COPD (chronic obstructive pulmonary disease) ([place])   . Depression   . Diabetes mellitus without complication ([place])   . GERD (gastroesophageal reflux disease)   . Hypertension      Patient Active Problem List   Diagnosis Date Noted  . Disorder of vein 03/04/2018  . Lumbar sprain 03/04/2018  . Muscle weakness 03/04/2018  . Neck sprain 03/04/2018  . 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  . Postmenopausal bleeding 02/18/2018  . Impingement syndrome of shoulder region 02/04/2018  . Chest pain with moderate risk for cardiac etiology 05/19/2017  . History of depression 04/15/2017  . Generalized anxiety disorder 04/15/2017  . Chronic daily headache 04/15/2017  . Panic attack 04/15/2017  . Nocturnal enuresis 04/15/2017  . Mild cognitive impairment 04/15/2017  . Loss of memory 01/14/2017  . Pain medication agreement signed 01/08/2017  . Headache disorder 12/04/2016  . Chronic, continuous use of opioids 07/01/2015  . Vertigo 07/01/2015  . Chronic midline low back pain without sciatica 03/21/2015  . Acute renal failure (ARF) ([place]) 02/19/2015  . Type 2 diabetes mellitus without  complication ([place]) 48/54/6270  . Acute pancreatitis 09/04/2014  . Cerebral palsy ([place]) 01/05/2014  . 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 Hip pain, chronic 04/28/2013  . Essential hypertension 01/10/2013  . Irritable colon 01/10/2013  . Noninfectious gastroenteritis and colitis 01/10/2013  . Encounter for long-term (current) use of other medications 12/04/2012  . Esophageal reflux 08/12/2012  . Epigastric pain 08/12/2012  . Diarrhea 08/12/2012  . Primary localized osteoarthrosis, lower leg 05/07/2012  . Difficulty walking 02/06/2012     Past Surgical History:  Procedure Laterality Date  . CESAREAN SECTION  1995  . CHOLECYSTECTOMY    . DILATATION & CURETTAGE/HYSTEROSCOPY WITH MYOSURE N/A 02/20/2018   Procedure: DILATATION & CURETTAGE/HYSTEROSCOPY WITH MYOSURE ENDOMETRIAL POLYPECTOMY;  Surgeon: Will Bonnet, MD;  Location: [facility] ORS;  Service: Gynecology;  Laterality: N/A;  . FOOT CAPSULE RELEASE W/ PERCUTANEOUS HEEL CORD LENGTHENING, TIBIAL TENDON TRANSFER     age 38 ,and 2010-both legs  . JOINT REPLACEMENT Right    both knees partial replacements dates unknown  . knee replacment     x 5 per pt. last one- left knee 2014. has had 2 on R 3 on L  . TYMPANOPLASTY WITH GRAFT Right 01/08/2018   Procedure: TYMPANOPLASTY WITH POSSIBLE OSSICULAR CHAIN RECONSTRUCTION;  Surgeon: [name], MD;  Location: [facility] ORS;  Service: ENT;  Laterality: Right;     Prior to Admission medications   Medication Sig Start Date End Date Taking? Authorizing Provider  albuterol (PROVENTIL HFA;VENTOLIN HFA) 108 (90 BASE) MCG/ACT inhaler Inhale 2 puffs into  the lungs 4 (four) times daily as needed. For shortness of breath and/or wheezing 09/22/14   [provider]  atorvastatin (LIPITOR) 10 MG tablet  05/26/18   [provider]  Blood Glucose Monitoring Suppl (GLUCOCOM BLOOD GLUCOSE MONITOR) DEVI Test daily before all meals/snacks and once before bedtime. 09/07/14    [provider]  buPROPion (WELLBUTRIN) 100 MG tablet Take 1 tablet (100 mg total) by mouth every morning. 07/21/18   Ursula Alert, MD  chlorzoxazone (PARAFON) 500 MG tablet Take 500 mg by mouth 3 (three) times daily as needed for muscle spasms.    [provider]  Cholecalciferol (VITAMIN D3) 5000 units CAPS Take 5,000 Units by mouth daily.    [provider]  CREON 36000 units CPEP capsule  03/26/18   [provider]  diclofenac sodium (VOLTAREN) 1 % GEL Apply 1 application topically as needed for pain. 12/09/17   [provider]  donepezil (ARICEPT) 10 MG tablet Take 10 mg by mouth at bedtime.  05/21/17   [provider]  FLUoxetine (PROZAC) 40 MG capsule Take 1 capsule (40 mg total) by mouth daily. 03/04/18   Ursula Alert, MD  furosemide (LASIX) 40 MG tablet Take 40 mg by mouth daily as needed for fluid.     [provider]  glimepiride (AMARYL) 4 MG tablet Take 4 mg by mouth daily with breakfast.    [provider]  glucose blood (ONE TOUCH ULTRA TEST) test strip USE TO TEST BLOOD SUGAR TWO TIMES A DAY 12/07/14   [provider]  HYDROmorphone (DILAUDID) 2 MG tablet Take 2 mg by mouth 3 (three) times daily as needed for severe pain.    [provider]  hydrOXYzine (VISTARIL) 25 MG capsule Take 1 capsule (25 mg total) by mouth 3 (three) times daily as needed for anxiety. Only for severe anxiety attacks 03/04/18   Ursula Alert, MD  incobotulinumtoxinA (XEOMIN) 100 units SOLR injection Inject 100 Units into the muscle every 3 (three) months.  03/29/15   [provider]  Lancets (FREESTYLE) lancets Test daily before all meals/snacks 09/07/14   [provider]  lisinopril (PRINIVIL,ZESTRIL) 20 MG tablet Take 20 mg by mouth daily.    [provider]  nitrofurantoin (MACRODANTIN) 100 MG capsule  03/04/18   [provider]  nystatin (MYCOSTATIN/NYSTOP) powder Apply 1 g topically  daily as needed (rash).  04/10/13   [provider]  ofloxacin (OCUFLOX) 0.3 % ophthalmic solution Place 4 drops into the right ear 2 (two) times daily. 01/08/18   [name], MD  omeprazole (PRILOSEC) 40 MG capsule Take 40 mg by mouth daily.     [provider]  pioglitazone (ACTOS) 45 MG tablet Take 45 mg by mouth daily.    [provider]  polyethylene glycol (MIRALAX / GLYCOLAX) packet Take 17 gm mixed in liqiud as needed for constipation 09/28/15   [provider]  pregabalin (LYRICA) 25 MG capsule Take 50 mg by mouth at bedtime.    [provider]  solifenacin (VESICARE) 10 MG tablet Take 10 mg by mouth daily.    [provider]  Tiotropium Bromide Monohydrate (SPIRIVA RESPIMAT) 1.25 MCG/ACT AERS Inhale 1 puff into the lungs daily.    [provider]  topiramate (TOPAMAX) 100 MG tablet Take 100 mg by mouth 2 (two) times daily.  07/17/16   [provider]  traZODone (DESYREL) 50 MG tablet Take 0.5-1 tablets (25-50 mg total) by mouth at bedtime. For sleep  07/21/18   Ursula Alert, MD     Allergies Meloxicam; Morphine and related; Vantin [cefpodoxime]; Latex; Baclofen; Ciprofloxacin; and Tape   Family History  Problem Relation Age of Onset  . CAD Father   . Heart attack Brother   . Sexual abuse Paternal Grandfather   . Breast cancer Neg Hx     Social History Social History   Tobacco Use  . Smoking status: Never Smoker  . Smokeless tobacco: Never Used  Substance Use Topics  . Alcohol use: No  . Drug use: No    Review of Systems  Constitutional:   No fever or chills.  ENT:   No sore throat. No rhinorrhea. Cardiovascular:   Positive as above chest pain without syncope. Respiratory:   Positive shortness of breath without cough. Gastrointestinal:   Negative for abdominal pain, vomiting and diarrhea.  Musculoskeletal:   Negative for focal pain or swelling All other systems reviewed and are negative  except as documented above in ROS and HPI.  ____________________________________________   PHYSICAL EXAM:  VITAL SIGNS: ED Triage Vitals  Enc Vitals Group     BP 10/05/18 1827 110/66     Pulse Rate 10/05/18 1827 71     Resp 10/05/18 1827 18     Temp --      Temp src --      SpO2 10/05/18 1827 95 %     Weight 10/05/18 1846 217 lb (98.4 kg)     Height 10/05/18 1846 5\' 2"  (1.575 m)     Head Circumference --      Peak Flow --      Pain Score 10/05/18 1844 8     Pain Loc --      Pain Edu? --      Excl. in [place]? --     Vital signs reviewed, nursing assessments reviewed.   Constitutional:   Alert and oriented. Non-toxic appearance. Eyes:   Conjunctivae are normal. EOMI. PERRL. ENT      Head:   Normocephalic and atraumatic.      Nose:   No congestion/rhinnorhea.       Mouth/Throat:   MMM, no pharyngeal erythema. No peritonsillar mass.       Neck:   No meningismus. Full ROM. Hematological/Lymphatic/Immunilogical:   No cervical lymphadenopathy. Cardiovascular:   RRR. Symmetric bilateral radial and DP pulses.  No murmurs. Cap refill less than 2 seconds. Respiratory:   Normal respiratory effort without tachypnea/retractions. Breath sounds are clear and equal bilaterally. No wheezes/rales/rhonchi. Gastrointestinal:   Soft and nontender. Non distended. There is no CVA tenderness.  No rebound, rigidity, or guarding.  Musculoskeletal:   Normal range of motion in all extremities. No joint effusions.  No lower extremity tenderness.  No edema.  Palpation of the central chest wall and right chest wall is very tender to the touch reproducing her pain. Neurologic:   Normal speech and language.  Motor grossly intact. No acute focal neurologic deficits are appreciated.  Skin:    Skin is warm, dry and intact. No rash noted.  No petechiae, purpura, or bullae.  ____________________________________________    LABS (pertinent positives/negatives) (all labs ordered are listed, but only abnormal  results are displayed) Labs Reviewed  COMPREHENSIVE METABOLIC PANEL - Abnormal; Notable for the following components:      Result Value   Creatinine, Ser 1.01 (*)    All other components within normal limits  CBC WITH DIFFERENTIAL/PLATELET - Abnormal; Notable for the following components:   MCH 25.1 (*)  RDW 16.4 (*)    All other components within normal limits  LIPASE, BLOOD  FIBRIN DERIVATIVES D-DIMER ([facility] ONLY)  TROPONIN I   ____________________________________________   EKG  Interpreted by me Sinus rhythm rate of 70, normal axis and intervals.  Normal QRS ST segments and T waves.  ____________________________________________    [id number]  Dg Chest Portable 1 View  Result Date: 10/05/2018 CLINICAL DATA:  48 year old female with history of central chest pain radiating into the right breast. EXAM: PORTABLE CHEST 1 VIEW COMPARISON:  Chest x-ray 04/08/2017. FINDINGS: Lung volumes are normal. No consolidative airspace disease. No pleural effusions. No pneumothorax. No pulmonary nodule or mass noted. Pulmonary vasculature and the cardiomediastinal silhouette are within normal limits. IMPRESSION: No radiographic evidence of acute cardiopulmonary disease. Electronically Signed   By: Vinnie Langton M.D.   On: 10/05/2018 19:08    ____________________________________________   PROCEDURES Procedures  ____________________________________________  DIFFERENTIAL DIAGNOSIS   Pleurisy, chest wall pain, less likely PE/non-STEMI/pneumothorax/pneumonia  CLINICAL IMPRESSION / ASSESSMENT AND PLAN / ED COURSE  Medications ordered in the ED: Medications - No data to display  Pertinent labs & imaging results that were available during my care of the patient were reviewed by me and considered in my medical decision making (see chart for details).  Traci Davis was evaluated in Emergency Department on 10/05/2018 for the symptoms described in the history of present illness. She was  evaluated in the context of the global COVID-19 pandemic, which necessitated consideration that the patient might be at risk for infection with the SARS-CoV-2 virus that causes COVID-19. Institutional protocols and algorithms that pertain to the evaluation of patients at risk for COVID-19 are in a state of rapid change based on information released by regulatory bodies including the CDC and federal and state organizations. These policies and algorithms were followed during the patient's care in the ED.   Patient presents with chest pain most consistent with musculoskeletal source.  Due to her comorbidities, will check labs including troponin d-dimer and chest x-ray, although with normal vital signs, unremarkable EKG, and physical exam findings, these other more serious possibilities are pretty unlikely.  Clinical Course as of Oct 04 2012  Sun Oct 05, 2018  2012 Labs and chest x-ray all unremarkable.  Vital signs are normal.  Exam is reassuring and consistent with musculoskeletal chest wall pain.  Patient can be discharged outpatient follow-up.  Low suspicion of ACS PE dissection AAA myocarditis pneumonia.   [PS]    Clinical Course User Index [PS] Carrie Mew, MD     ____________________________________________   FINAL CLINICAL IMPRESSION(S) / ED DIAGNOSES    Final diagnoses:  Chest wall pain     ED Discharge Orders    None      Portions of this note were generated with dragon dictation software. Dictation errors may occur despite best attempts at proofreading.   Carrie Mew, MD 10/05/18 2017

## 2018-10-05 NOTE — Discharge Instructions (Addendum)
Your chest xray and lab tests today were all okay.  Your exam shows that the pain is coming from painful chest wall muscles.  Take tylenol and use voltaren gel as needed to control pain.  Intermittent use of a heating pad can also be helpful.

## 2018-11-17 ENCOUNTER — Other Ambulatory Visit: Payer: Self-pay

## 2018-11-19 ENCOUNTER — Telehealth: Payer: Self-pay

## 2018-11-19 ENCOUNTER — Other Ambulatory Visit (HOSPITAL_COMMUNITY): Payer: Self-pay | Admitting: Psychiatry

## 2018-11-19 DIAGNOSIS — F5105 Insomnia due to other mental disorder: Secondary | ICD-10-CM

## 2018-11-19 MED ORDER — TRAZODONE HCL 50 MG PO TABS: 25.0000 mg | ORAL_TABLET | Freq: Every day | ORAL | 0 refills | Status: DC

## 2018-11-19 NOTE — Telephone Encounter (Signed)
This is a patient of Dr. Shea Evans. Patient called requesting a refill on her Trazodone 50mg . She has a scheduled appointment for 12/29/18 and uses [facility] in [place]. Please review and advise. Thank you.

## 2018-11-19 NOTE — Telephone Encounter (Signed)
ordered

## 2018-12-01 ENCOUNTER — Other Ambulatory Visit: Payer: Self-pay | Admitting: Physician Assistant

## 2018-12-01 DIAGNOSIS — H9201 Otalgia, right ear: Secondary | ICD-10-CM

## 2018-12-08 ENCOUNTER — Other Ambulatory Visit: Payer: Self-pay

## 2018-12-08 ENCOUNTER — Ambulatory Visit
Admission: RE | Admit: 2018-12-08 | Discharge: 2018-12-08 | Disposition: A | Discharge status: Home / Self Care | Payer: Medicare Other | Source: Ambulatory Visit | Attending: Physician Assistant | Admitting: Physician Assistant

## 2018-12-08 DIAGNOSIS — H9201 Otalgia, right ear: Secondary | ICD-10-CM | POA: Diagnosis not present

## 2018-12-08 LAB — POCT I-STAT CREATININE: Creatinine, Ser: 1.1 mg/dL — ABNORMAL HIGH (ref 0.44–1.00)

## 2018-12-08 MED ORDER — IOHEXOL 300 MG/ML  SOLN
75.0000 mL | Freq: Once | INTRAMUSCULAR | Status: AC | PRN
  Administered 2018-12-08: 13:00:00 75 mL via INTRAVENOUS

## 2018-12-11 ENCOUNTER — Ambulatory Visit (INDEPENDENT_AMBULATORY_CARE_PROVIDER_SITE_OTHER): Payer: Medicare Other | Admitting: Gastroenterology

## 2018-12-11 ENCOUNTER — Other Ambulatory Visit: Payer: Self-pay

## 2018-12-11 ENCOUNTER — Encounter: Payer: Self-pay | Admitting: Gastroenterology

## 2018-12-11 VITALS — BP 102/67 | HR 67 | Temp 98.1°F | Resp 16 | Ht 62.0 in | Wt 220.2 lb

## 2018-12-11 DIAGNOSIS — R1310 Dysphagia, unspecified: Secondary | ICD-10-CM

## 2018-12-11 DIAGNOSIS — K219 Gastro-esophageal reflux disease without esophagitis: Secondary | ICD-10-CM

## 2018-12-11 NOTE — Progress Notes (Signed)
Cephas Darby, MD [address]  [place]  [place], [place] [address]  Main: [phone]  Fax: [phone]    Gastroenterology Consultation  Referring Provider:     Arturo Morton, [place]* Primary Care Physician:  Remi Haggard, FNP Primary Gastroenterologist:  Dr. Cephas Darby Reason for Consultation:     Dysphagia        HPI:   Traci Davis is a 48 y.o. female referred by Dr. Lavena Bullion, Jordan Likes, FNP  for consultation & management of dysphagia.  Patient has history of generalized anxiety disorder, depression, mild cognitive impairment, diabetes who is morbidly obese.  For the last 2 weeks, patient has been suffering from bilateral leg pain, predominantly on the right side.  Whenever she tries to eat solid food she feels it is getting stuck in her throat, has to drink liquids.  She has been taking omeprazole 40 mg daily since age of 42 for reflux symptoms.  Predominantly heartburn and regurgitation.  She does drink sodas, sweetened tea regularly  Patient underwent CT soft tissue of the neck on 8/10 which was unremarkable  NSAIDs: None  Antiplts/Anticoagulants/Anti thrombotics: None  GI Procedures: She reports having had an endoscopy several years ago She denies family history of GI malignancy  Past Medical History:  Diagnosis Date  . Anxiety   . Arthritis   . Asthma   . Cerebral palsy ([place])   . Complication of anesthesia    panic attacks before surgery  . COPD (chronic obstructive pulmonary disease) ([place])   . Depression   . Diabetes mellitus without complication ([place])   . GERD (gastroesophageal reflux disease)   . Hypertension     Past Surgical History:  Procedure Laterality Date  . CESAREAN SECTION  1995  . CHOLECYSTECTOMY    . DILATATION & CURETTAGE/HYSTEROSCOPY WITH MYOSURE N/A 02/20/2018   Procedure: DILATATION & CURETTAGE/HYSTEROSCOPY WITH MYOSURE ENDOMETRIAL POLYPECTOMY;  Surgeon: Will Bonnet, MD;  Location: [facility] ORS;  Service:  Gynecology;  Laterality: N/A;  . FOOT CAPSULE RELEASE W/ PERCUTANEOUS HEEL CORD LENGTHENING, TIBIAL TENDON TRANSFER     age 10 ,and 2010-both legs  . JOINT REPLACEMENT Right    both knees partial replacements dates unknown  . knee replacment     x 5 per pt. last one- left knee 2014. has had 2 on R 3 on L  . TYMPANOPLASTY WITH GRAFT Right 01/08/2018   Procedure: TYMPANOPLASTY WITH POSSIBLE OSSICULAR CHAIN RECONSTRUCTION;  Surgeon: [name], MD;  Location: [facility] ORS;  Service: ENT;  Laterality: Right;    Current Outpatient Medications:  .  albuterol (PROVENTIL HFA;VENTOLIN HFA) 108 (90 BASE) MCG/ACT inhaler, Inhale 2 puffs into the lungs 4 (four) times daily as needed. For shortness of breath and/or wheezing, Disp: , Rfl:  .  AMITIZA 24 MCG capsule, , Disp: , Rfl:  .  amoxicillin-clavulanate (AUGMENTIN) 875-125 MG tablet, , Disp: , Rfl:  .  atorvastatin (LIPITOR) 10 MG tablet, , Disp: , Rfl:  .  Blood Glucose Monitoring Suppl (GLUCOCOM BLOOD GLUCOSE MONITOR) DEVI, Test daily before all meals/snacks and once before bedtime., Disp: , Rfl:  .  buPROPion (WELLBUTRIN) 100 MG tablet, Take 1 tablet (100 mg total) by mouth every morning., Disp: 90 tablet, Rfl: 0 .  chlorzoxazone (PARAFON) 500 MG tablet, Take 500 mg by mouth 3 (three) times daily as needed for muscle spasms., Disp: , Rfl:  .  CREON 36000 units CPEP capsule, , Disp: , Rfl:  .  diclofenac sodium (VOLTAREN)  1 % GEL, Apply 1 application topically as needed (apply to painful areas of chest wall)., Disp: 100 g, Rfl: 0 .  dicyclomine (BENTYL) 10 MG capsule, , Disp: , Rfl:  .  donepezil (ARICEPT) 10 MG tablet, Take 10 mg by mouth at bedtime. , Disp: , Rfl:  .  FLUoxetine (PROZAC) 40 MG capsule, Take 1 capsule (40 mg total) by mouth daily., Disp: 90 capsule, Rfl: 1 .  furosemide (LASIX) 40 MG tablet, Take 40 mg by mouth daily as needed for fluid. , Disp: , Rfl:  .  glimepiride (AMARYL) 4 MG tablet, Take 4 mg by mouth daily with breakfast.,  Disp: , Rfl:  .  glucose blood (ONE TOUCH ULTRA TEST) test strip, USE TO TEST BLOOD SUGAR TWO TIMES A DAY, Disp: , Rfl:  .  hydrOXYzine (VISTARIL) 25 MG capsule, Take 1 capsule (25 mg total) by mouth 3 (three) times daily as needed for anxiety. Only for severe anxiety attacks, Disp: 270 capsule, Rfl: 1 .  incobotulinumtoxinA (XEOMIN) 100 units SOLR injection, Inject 100 Units into the muscle every 3 (three) months. , Disp: , Rfl:  .  Lancets (FREESTYLE) lancets, Test daily before all meals/snacks, Disp: , Rfl:  .  lidocaine (LIDODERM) 5 %, , Disp: , Rfl:  .  lisinopril (PRINIVIL,ZESTRIL) 20 MG tablet, Take 20 mg by mouth daily., Disp: , Rfl:  .  nitrofurantoin (MACRODANTIN) 100 MG capsule, , Disp: , Rfl:  .  nystatin (MYCOSTATIN/NYSTOP) powder, Apply 1 g topically daily as needed (rash). , Disp: , Rfl:  .  omeprazole (PRILOSEC) 40 MG capsule, Take 40 mg by mouth daily. , Disp: , Rfl:  .  oxyCODONE-acetaminophen (PERCOCET/ROXICET) 5-325 MG tablet, oxycodone-acetaminophen 5 mg-325 mg tablet, Disp: , Rfl:  .  pioglitazone (ACTOS) 45 MG tablet, Take 45 mg by mouth daily., Disp: , Rfl:  .  pregabalin (LYRICA) 25 MG capsule, Take 50 mg by mouth at bedtime., Disp: , Rfl:  .  solifenacin (VESICARE) 10 MG tablet, Take 10 mg by mouth daily., Disp: , Rfl:  .  traZODone (DESYREL) 50 MG tablet, Take 0.5-1 tablets (25-50 mg total) by mouth at bedtime. For sleep, Disp: 90 tablet, Rfl: 0 .  acetaminophen (TYLENOL) 325 MG tablet, Take 2 tablets (650 mg total) by mouth every 6 (six) hours as needed. (Patient not taking: Reported on 12/11/2018), Disp: 60 tablet, Rfl: 0 .  Azelastine-Fluticasone 137-50 MCG/ACT SUSP, Dymista 137 mcg-50 mcg/spray nasal spray, Disp: , Rfl:  .  Cholecalciferol (VITAMIN D3) 5000 units CAPS, Take 5,000 Units by mouth daily., Disp: , Rfl:  .  HYDROmorphone (DILAUDID) 2 MG tablet, Take 2 mg by mouth 3 (three) times daily as needed for severe pain., Disp: , Rfl:  .  ofloxacin (OCUFLOX) 0.3 %  ophthalmic solution, Place 4 drops into the right ear 2 (two) times daily. (Patient not taking: Reported on 12/11/2018), Disp: 5 mL, Rfl: 0 .  polyethylene glycol (MIRALAX / GLYCOLAX) packet, Take 17 gm mixed in liqiud as needed for constipation, Disp: , Rfl:  .  Tiotropium Bromide Monohydrate (SPIRIVA RESPIMAT) 1.25 MCG/ACT AERS, Inhale 1 puff into the lungs daily., Disp: , Rfl:  .  topiramate (TOPAMAX) 100 MG tablet, Take 100 mg by mouth 2 (two) times daily. , Disp: , Rfl:    Family History  Problem Relation Age of Onset  . CAD Father   . Heart attack Brother   . Sexual abuse Paternal Grandfather   . Breast cancer Neg Hx  Social History   Tobacco Use  . Smoking status: Never Smoker  . Smokeless tobacco: Never Used  Substance Use Topics  . Alcohol use: No  . Drug use: No    Allergies as of 12/11/2018 - Review Complete 12/11/2018  Allergen Reaction Noted  . Meloxicam Other (See Comments) 02/19/2015  . Morphine and related Other (See Comments) 02/19/2015  . Vantin [cefpodoxime] Nausea And Vomiting 02/19/2015  . Latex Itching 01/08/2018  . Baclofen Other (See Comments) 02/19/2015  . Ciprofloxacin Itching and Nausea And Vomiting 02/19/2015  . Tape Rash 02/19/2015    Review of Systems:    All systems reviewed and negative except where noted in HPI.   Physical Exam:  BP 102/67 (BP Location: Left Arm, Patient Position: Sitting, Cuff Size: Large)   Pulse 67   Temp 98.1 F (36.7 C)   Resp 16   Ht 5\' 2"  (1.575 m)   Wt 220 lb 3.2 oz (99.9 kg)   LMP 08/20/2014   BMI 40.28 kg/m  Patient's last menstrual period was 08/20/2014.  General:   Alert,  Well-developed, well-nourished, pleasant and cooperative in NAD Head:  Normocephalic and atraumatic. Eyes:  Sclera clear, no icterus.   Conjunctiva pink. Ears:  Normal auditory acuity. Nose:  No deformity, discharge, or lesions. Mouth:  No deformity or lesions,oropharynx pink & moist. Neck:  Supple; no masses or thyromegaly.  Lungs:  Respirations even and unlabored.  Clear throughout to auscultation.   No wheezes, crackles, or rhonchi. No acute distress. Heart:  Regular rate and rhythm; no murmurs, clicks, rubs, or gallops. Abdomen:  Normal bowel sounds. Soft, obese, non-tender and non-distended without masses, hepatosplenomegaly or hernias noted.  No guarding or rebound tenderness.   Rectal: Not performed Msk:  Symmetrical without gross deformities. Pulses:  Normal pulses noted. Extremities:  No clubbing or edema.  No cyanosis. Neurologic:  Alert and oriented x3;  grossly normal neurologically. Skin:  Intact without significant lesions or rashes. No jaundice. Psych:  Alert and cooperative. Normal mood and affect.  Imaging Studies: Reviewed  Assessment and Plan:   Traci Davis is a 48 y.o. female with metabolic syndrome, chronic GERD maintained on PPI seen in consultation for right-sided neck pain, worse with swallowing and possible dysphagia  Recommend EGD for further evaluation and also to screen for Barrett's esophagus Continue omeprazole at current dose Strongly advised her to completely eliminate sodas and sweetened tea   Follow up in 2 months   Cephas Darby, MD

## 2018-12-29 ENCOUNTER — Encounter: Payer: Self-pay | Admitting: Psychiatry

## 2018-12-29 ENCOUNTER — Other Ambulatory Visit
Admission: RE | Admit: 2018-12-29 | Discharge: 2018-12-29 | Disposition: A | Discharge status: Home / Self Care | Payer: Medicare Other | Source: Ambulatory Visit | Attending: Gastroenterology | Admitting: Gastroenterology

## 2018-12-29 ENCOUNTER — Ambulatory Visit (INDEPENDENT_AMBULATORY_CARE_PROVIDER_SITE_OTHER): Payer: Medicare Other | Admitting: Psychiatry

## 2018-12-29 ENCOUNTER — Other Ambulatory Visit: Payer: Self-pay

## 2018-12-29 DIAGNOSIS — F330 Major depressive disorder, recurrent, mild: Secondary | ICD-10-CM | POA: Diagnosis not present

## 2018-12-29 DIAGNOSIS — F5105 Insomnia due to other mental disorder: Secondary | ICD-10-CM | POA: Diagnosis not present

## 2018-12-29 DIAGNOSIS — F411 Generalized anxiety disorder: Secondary | ICD-10-CM

## 2018-12-29 DIAGNOSIS — Z20828 Contact with and (suspected) exposure to other viral communicable diseases: Secondary | ICD-10-CM | POA: Diagnosis not present

## 2018-12-29 DIAGNOSIS — Z01812 Encounter for preprocedural laboratory examination: Secondary | ICD-10-CM | POA: Diagnosis present

## 2018-12-29 LAB — SARS CORONAVIRUS 2 (TAT 6-24 HRS): SARS Coronavirus 2: NEGATIVE

## 2018-12-29 MED ORDER — FLUOXETINE HCL 40 MG PO CAPS: 40.0000 mg | ORAL_CAPSULE | Freq: Every day | ORAL | 1 refills | Status: DC

## 2018-12-29 MED ORDER — TRAZODONE HCL 100 MG PO TABS: 100.0000 mg | ORAL_TABLET | Freq: Every day | ORAL | 0 refills | Status: DC

## 2018-12-29 MED ORDER — BUPROPION HCL 100 MG PO TABS: 100.0000 mg | ORAL_TABLET | Freq: Every morning | ORAL | 1 refills | Status: DC

## 2018-12-29 NOTE — Progress Notes (Signed)
Virtual Visit via Telephone Note  I connected with [name] on 12/29/18 at  2:30 PM EDT by telephone and verified that I am speaking with the correct person using two identifiers.   I discussed the limitations, risks, security and privacy concerns of performing an evaluation and management service by telephone and the availability of in person appointments. I also discussed with the patient that there may be a patient responsible charge related to this service. The patient expressed understanding and agreed to proceed.   I discussed the assessment and treatment plan with the patient. The patient was provided an opportunity to ask questions and all were answered. The patient agreed with the plan and demonstrated an understanding of the instructions.   The patient was advised to call back or seek an in-person evaluation if the symptoms worsen or if the condition fails to improve as anticipated.   [name] OP Progress Note  12/29/2018 5:21 PM VERSA Davis  MRN:  [id number]  Chief Complaint:  Chief Complaint    Follow-up     HPI: Traci Davis is a 48 year old Caucasian female, lives in [place], on [place], has a history of depression, GAD, insomnia, cerebral palsy, diabetes melitis was evaluated by phone today.  Patient preferred to do a phone call.  Patient today reports she continues to struggle with sleep.  She reports it takes her around 3 hours to fall asleep.  She has been taking trazodone 50 mg which helps to some extent.  Discussed readjusting her dosage.  Patient otherwise denies any significant mood symptoms.  She reports depression and anxiety is stable on the Prozac and the Wellbutrin.  She denies any side effects to the medications.  She denies any suicidality, homicidality or perceptual disturbances.  Her boyfriend continues to be supportive.  She denies any other concerns today. Visit Diagnosis:    ICD-10-CM   1. MDD (major depressive disorder), recurrent episode, mild (HCC)   F33.0 buPROPion (WELLBUTRIN) 100 MG tablet  2. GAD (generalized anxiety disorder)  F41.1 FLUoxetine (PROZAC) 40 MG capsule  3. Insomnia due to mental condition  F51.05 traZODone (DESYREL) 100 MG tablet    Past Psychiatric History: I have reviewed past psychiatric history from my progress note on 09/12/2017.  Past trials of Xanax, Klonopin, Cymbalta, rozerem-costly.  Past Medical History:  Past Medical History:  Diagnosis Date  . Anxiety   . Arthritis   . Asthma   . Cerebral palsy ([place])   . Complication of anesthesia    panic attacks before surgery  . COPD (chronic obstructive pulmonary disease) ([place])   . Depression   . Diabetes mellitus without complication ([place])   . GERD (gastroesophageal reflux disease)   . Hypertension     Past Surgical History:  Procedure Laterality Date  . CESAREAN SECTION  1995  . CHOLECYSTECTOMY    . DILATATION & CURETTAGE/HYSTEROSCOPY WITH MYOSURE N/A 02/20/2018   Procedure: DILATATION & CURETTAGE/HYSTEROSCOPY WITH MYOSURE ENDOMETRIAL POLYPECTOMY;  Surgeon: Will Bonnet, MD;  Location: [facility] ORS;  Service: Gynecology;  Laterality: N/A;  . FOOT CAPSULE RELEASE Traci/ PERCUTANEOUS HEEL CORD LENGTHENING, TIBIAL TENDON TRANSFER     age 59 ,and 2010-both legs  . JOINT REPLACEMENT Right    both knees partial replacements dates unknown  . knee replacment     x 5 per pt. last one- left knee 2014. has had 2 on R 3 on L  . TYMPANOPLASTY WITH GRAFT Right 01/08/2018   Procedure: TYMPANOPLASTY WITH POSSIBLE OSSICULAR CHAIN RECONSTRUCTION;  Surgeon:  [name], MD;  Location: [facility] ORS;  Service: ENT;  Laterality: Right;    Family Psychiatric History: Reviewed family psychiatric history from my progress note on 09/12/2017.  Family History:  Family History  Problem Relation Age of Onset  . CAD Father   . Heart attack Brother   . Sexual abuse Paternal Grandfather   . Breast cancer Neg Hx     Social History: Reviewed social history from my progress note on  09/12/2017. Social History   Socioeconomic History  . Marital status: Single    Spouse name: Not on file  . Number of children: Not on file  . Years of education: Not on file  . Highest education level: Not on file  Occupational History  . Not on file  Social Needs  . Financial resource strain: Not on file  . Food insecurity    Worry: Not on file    Inability: Not on file  . Transportation needs    Medical: Not on file    Non-medical: Not on file  Tobacco Use  . Smoking status: Never Smoker  . Smokeless tobacco: Never Used  Substance and Sexual Activity  . Alcohol use: No  . Drug use: No  . Sexual activity: Yes    Partners: Male    Birth control/protection: Condom  Lifestyle  . Physical activity    Days per week: Not on file    Minutes per session: Not on file  . Stress: Not on file  Relationships  . Social Herbalist on phone: Not on file    Gets together: Not on file    Attends religious service: Not on file    Active member of club or organization: Not on file    Attends meetings of clubs or organizations: Not on file    Relationship status: Not on file  Other Topics Concern  . Not on file  Social History Narrative  . Not on file    Allergies:  Allergies  Allergen Reactions  . Meloxicam Other (See Comments)    Damage kidney  . Morphine And Related Other (See Comments)    hallucinations  . Vantin [Cefpodoxime] Nausea And Vomiting  . Latex Itching  . Baclofen Other (See Comments)    "makes cerebral palsy do adverse reaction on me", tightens muscles   . Ciprofloxacin Itching and Nausea And Vomiting  . Tape Rash    skin tears.  Paper tape is ok    Metabolic Disorder Labs: Lab Results  Component Value Date   HGBA1C 6.1 (H) 02/19/2015   No results found for: PROLACTIN No results found for: CHOL, TRIG, HDL, CHOLHDL, VLDL, LDLCALC Lab Results  Component Value Date   TSH 2.198 07/10/2016   TSH 2.891 02/19/2015    Therapeutic Level  Labs: No results found for: LITHIUM No results found for: VALPROATE No components found for:  CBMZ  Current Medications: Current Outpatient Medications  Medication Sig Dispense Refill  . acetaminophen (TYLENOL) 325 MG tablet Take 2 tablets (650 mg total) by mouth every 6 (six) hours as needed. (Patient not taking: Reported on 12/11/2018) 60 tablet 0  . albuterol (PROVENTIL HFA;VENTOLIN HFA) 108 (90 BASE) MCG/ACT inhaler Inhale 2 puffs into the lungs 4 (four) times daily as needed. For shortness of breath and/or wheezing    . AMITIZA 24 MCG capsule     . amoxicillin (AMOXIL) 500 MG capsule     . amoxicillin-clavulanate (AUGMENTIN) 875-125 MG tablet     . atorvastatin (  LIPITOR) 10 MG tablet     . Azelastine-Fluticasone 137-50 MCG/ACT SUSP Dymista 137 mcg-50 mcg/spray nasal spray    . Blood Glucose Monitoring Suppl (GLUCOCOM BLOOD GLUCOSE MONITOR) DEVI Test daily before all meals/snacks and once before bedtime.    Marland Kitchen buPROPion (WELLBUTRIN) 100 MG tablet Take 1 tablet (100 mg total) by mouth every morning. 90 tablet 1  . chlorzoxazone (PARAFON) 500 MG tablet Take 500 mg by mouth 3 (three) times daily as needed for muscle spasms.    . Cholecalciferol (VITAMIN D3) 5000 units CAPS Take 5,000 Units by mouth daily.    Marland Kitchen CIPRODEX OTIC suspension     . CREON 36000 units CPEP capsule     . diclofenac sodium (VOLTAREN) 1 % GEL Apply 1 application topically as needed (apply to painful areas of chest wall). 100 g 0  . dicyclomine (BENTYL) 10 MG capsule     . donepezil (ARICEPT) 10 MG tablet Take 10 mg by mouth at bedtime.     Marland Kitchen FLUoxetine (PROZAC) 40 MG capsule Take 1 capsule (40 mg total) by mouth daily. 90 capsule 1  . furosemide (LASIX) 40 MG tablet Take 40 mg by mouth daily as needed for fluid.     Marland Kitchen glimepiride (AMARYL) 4 MG tablet Take 4 mg by mouth daily with breakfast.    . glucose blood (ONE TOUCH ULTRA TEST) test strip USE TO TEST BLOOD SUGAR TWO TIMES A DAY    . HYDROmorphone (DILAUDID) 2 MG  tablet Take 2 mg by mouth 3 (three) times daily as needed for severe pain.    . hydrOXYzine (VISTARIL) 25 MG capsule Take 1 capsule (25 mg total) by mouth 3 (three) times daily as needed for anxiety. Only for severe anxiety attacks 270 capsule 1  . incobotulinumtoxinA (XEOMIN) 100 units SOLR injection Inject 100 Units into the muscle every 3 (three) months.     . Lancets (FREESTYLE) lancets Test daily before all meals/snacks    . lidocaine (LIDODERM) 5 %     . lisinopril (PRINIVIL,ZESTRIL) 20 MG tablet Take 20 mg by mouth daily.    . nitrofurantoin (MACRODANTIN) 100 MG capsule     . nystatin (MYCOSTATIN/NYSTOP) powder Apply 1 g topically daily as needed (rash).     Marland Kitchen ofloxacin (OCUFLOX) 0.3 % ophthalmic solution Place 4 drops into the right ear 2 (two) times daily. (Patient not taking: Reported on 12/11/2018) 5 mL 0  . omeprazole (PRILOSEC) 40 MG capsule Take 40 mg by mouth daily.     Marland Kitchen oxyCODONE (OXY IR/ROXICODONE) 5 MG immediate release tablet     . oxyCODONE-acetaminophen (PERCOCET/ROXICET) 5-325 MG tablet oxycodone-acetaminophen 5 mg-325 mg tablet    . pioglitazone (ACTOS) 45 MG tablet Take 45 mg by mouth daily.    . polyethylene glycol (MIRALAX / GLYCOLAX) packet Take 17 gm mixed in liqiud as needed for constipation    . pregabalin (LYRICA) 25 MG capsule Take 50 mg by mouth at bedtime.    . solifenacin (VESICARE) 10 MG tablet Take 10 mg by mouth daily.    . Tiotropium Bromide Monohydrate (SPIRIVA RESPIMAT) 1.25 MCG/ACT AERS Inhale 1 puff into the lungs daily.    Marland Kitchen topiramate (TOPAMAX) 100 MG tablet Take 100 mg by mouth 2 (two) times daily.     . traZODone (DESYREL) 100 MG tablet Take 1 tablet (100 mg total) by mouth at bedtime. 90 tablet 0   No current facility-administered medications for this visit.      Musculoskeletal: Strength & Muscle Tone:  UTA Gait & Station: Wheelchair bound Patient leans: N/A  Psychiatric Specialty Exam: Review of Systems  Psychiatric/Behavioral: The  patient has insomnia.   All other systems reviewed and are negative.   Last menstrual period 08/20/2014.There is no height or weight on file to calculate BMI.  General Appearance: UTA  Eye Contact:  UTA  Speech:  Clear and Coherent  Volume:  Normal  Mood:  Euthymic  Affect:  Congruent  Thought Process:  Goal Directed and Descriptions of Associations: Intact  Orientation:  Full (Time, Place, and Person)  Thought Content: Logical   Suicidal Thoughts:  No  Homicidal Thoughts:  No  Memory:  Immediate;   Fair Recent;   Fair Remote;   Fair  Judgement:  Fair  Insight:  Fair  Psychomotor Activity:  UTA  Concentration:  Concentration: Fair and Attention Span: Fair  Recall:  [employer] of Knowledge: Fair  Language: Fair  Akathisia:  No  Handed:  Right  AIMS (if indicated):UTA  Assets:  Communication Skills Desire for Improvement Housing Intimacy  ADL's:  Intact  Cognition: WNL  Sleep:  Restless   Screenings:   Assessment and Plan: Libertie is a 48 year old Caucasian female who has a history of depression, chronic pain, diabetes melitis, MCI, cerebral palsy, was evaluated by phone today.  Patient preferred to do a phone call.  Patient is currently doing well on current medication regimen except for sleep.  She is on trazodone and discussed increasing the dosage.  Plan as noted below.  Plan Depression-improving Prozac 40 mg p.o. daily Wellbutrin 100 mg p.o. daily in the morning  GAD-improving Prozac as prescribed Hydroxyzine as needed.  She rarely takes it.  For insomnia-unstable Increase trazodone to 100 mg p.o. nightly.  Follow-up in clinic in 6 weeks  or sooner if needed.  October 29 at 4 PM  I have spent atleast 15 minutes non  face to face with patient today. More than 50 % of the time was spent for psychoeducation and supportive psychotherapy and care coordination. This note was generated in part or whole with voice recognition software. Voice recognition is usually  quite accurate but there are transcription errors that can and very often do occur. I apologize for any typographical errors that were not detected and corrected.         Ursula Alert, MD 12/29/2018, 5:21 PM

## 2018-12-31 ENCOUNTER — Encounter: Payer: Self-pay | Admitting: *Deleted

## 2019-01-01 ENCOUNTER — Encounter: Payer: Self-pay | Admitting: *Deleted

## 2019-01-01 ENCOUNTER — Other Ambulatory Visit: Payer: Self-pay

## 2019-01-01 ENCOUNTER — Ambulatory Visit: Payer: Medicare Other | Admitting: Anesthesiology

## 2019-01-01 ENCOUNTER — Ambulatory Visit
Admission: RE | Admit: 2019-01-01 | Discharge: 2019-01-01 | Disposition: A | Discharge status: Home / Self Care | Payer: Medicare Other | Attending: Gastroenterology | Admitting: Gastroenterology

## 2019-01-01 ENCOUNTER — Encounter
Admission: RE | Disposition: A | Discharge status: Home / Self Care | Payer: Self-pay | Source: Home / Self Care | Attending: Gastroenterology

## 2019-01-01 DIAGNOSIS — Z888 Allergy status to other drugs, medicaments and biological substances status: Secondary | ICD-10-CM | POA: Insufficient documentation

## 2019-01-01 DIAGNOSIS — E119 Type 2 diabetes mellitus without complications: Secondary | ICD-10-CM | POA: Insufficient documentation

## 2019-01-01 DIAGNOSIS — K219 Gastro-esophageal reflux disease without esophagitis: Secondary | ICD-10-CM

## 2019-01-01 DIAGNOSIS — Z91040 Latex allergy status: Secondary | ICD-10-CM | POA: Insufficient documentation

## 2019-01-01 DIAGNOSIS — M1990 Unspecified osteoarthritis, unspecified site: Secondary | ICD-10-CM | POA: Insufficient documentation

## 2019-01-01 DIAGNOSIS — R1310 Dysphagia, unspecified: Secondary | ICD-10-CM | POA: Diagnosis present

## 2019-01-01 DIAGNOSIS — F419 Anxiety disorder, unspecified: Secondary | ICD-10-CM | POA: Insufficient documentation

## 2019-01-01 DIAGNOSIS — G809 Cerebral palsy, unspecified: Secondary | ICD-10-CM | POA: Diagnosis not present

## 2019-01-01 DIAGNOSIS — J45909 Unspecified asthma, uncomplicated: Secondary | ICD-10-CM | POA: Diagnosis not present

## 2019-01-01 DIAGNOSIS — Z881 Allergy status to other antibiotic agents status: Secondary | ICD-10-CM | POA: Insufficient documentation

## 2019-01-01 DIAGNOSIS — Z8249 Family history of ischemic heart disease and other diseases of the circulatory system: Secondary | ICD-10-CM | POA: Insufficient documentation

## 2019-01-01 DIAGNOSIS — I10 Essential (primary) hypertension: Secondary | ICD-10-CM | POA: Insufficient documentation

## 2019-01-01 DIAGNOSIS — Z91048 Other nonmedicinal substance allergy status: Secondary | ICD-10-CM | POA: Insufficient documentation

## 2019-01-01 DIAGNOSIS — F329 Major depressive disorder, single episode, unspecified: Secondary | ICD-10-CM | POA: Insufficient documentation

## 2019-01-01 DIAGNOSIS — Z9049 Acquired absence of other specified parts of digestive tract: Secondary | ICD-10-CM | POA: Insufficient documentation

## 2019-01-01 DIAGNOSIS — Z8489 Family history of other specified conditions: Secondary | ICD-10-CM | POA: Insufficient documentation

## 2019-01-01 DIAGNOSIS — Z7984 Long term (current) use of oral hypoglycemic drugs: Secondary | ICD-10-CM | POA: Diagnosis not present

## 2019-01-01 DIAGNOSIS — Z79899 Other long term (current) drug therapy: Secondary | ICD-10-CM | POA: Insufficient documentation

## 2019-01-01 DIAGNOSIS — Z885 Allergy status to narcotic agent status: Secondary | ICD-10-CM | POA: Insufficient documentation

## 2019-01-01 DIAGNOSIS — J449 Chronic obstructive pulmonary disease, unspecified: Secondary | ICD-10-CM | POA: Diagnosis not present

## 2019-01-01 DIAGNOSIS — Z96653 Presence of artificial knee joint, bilateral: Secondary | ICD-10-CM | POA: Insufficient documentation

## 2019-01-01 DIAGNOSIS — Z7951 Long term (current) use of inhaled steroids: Secondary | ICD-10-CM | POA: Diagnosis not present

## 2019-01-01 HISTORY — PX: ESOPHAGOGASTRODUODENOSCOPY (EGD) WITH PROPOFOL: SHX5813

## 2019-01-01 LAB — GLUCOSE, CAPILLARY: Glucose-Capillary: 97 mg/dL (ref 70–99)

## 2019-01-01 SURGERY — ESOPHAGOGASTRODUODENOSCOPY (EGD) WITH PROPOFOL
Anesthesia: General

## 2019-01-01 MED ORDER — PROPOFOL 500 MG/50ML IV EMUL
INTRAVENOUS | Status: AC
  Filled 2019-01-01: qty 50

## 2019-01-01 MED ORDER — LIDOCAINE HCL (CARDIAC) PF 100 MG/5ML IV SOSY
PREFILLED_SYRINGE | INTRAVENOUS | Status: DC | PRN
  Administered 2019-01-01: 50 mg via INTRAVENOUS

## 2019-01-01 MED ORDER — LIDOCAINE HCL (PF) 2 % IJ SOLN
INTRAMUSCULAR | Status: AC
  Filled 2019-01-01: qty 10

## 2019-01-01 MED ORDER — LIDOCAINE HCL (PF) 1 % IJ SOLN
INTRAMUSCULAR | Status: AC
  Filled 2019-01-01: qty 2

## 2019-01-01 MED ORDER — MIDAZOLAM HCL 2 MG/2ML IJ SOLN
INTRAMUSCULAR | Status: AC
  Filled 2019-01-01: qty 2

## 2019-01-01 MED ORDER — PROPOFOL 10 MG/ML IV BOLUS
INTRAVENOUS | Status: DC | PRN
  Administered 2019-01-01: 30 mg via INTRAVENOUS
  Administered 2019-01-01: 80 mg via INTRAVENOUS

## 2019-01-01 MED ORDER — LABETALOL HCL 5 MG/ML IV SOLN
INTRAVENOUS | Status: AC
  Filled 2019-01-01: qty 4

## 2019-01-01 MED ORDER — SODIUM CHLORIDE 0.9 % IV SOLN
INTRAVENOUS | Status: DC
  Administered 2019-01-01: 09:00:00 via INTRAVENOUS

## 2019-01-01 MED ORDER — MIDAZOLAM HCL 2 MG/2ML IJ SOLN
INTRAMUSCULAR | Status: DC | PRN
  Administered 2019-01-01: 2 mg via INTRAVENOUS

## 2019-01-01 NOTE — Anesthesia Post-op Follow-up Note (Signed)
Anesthesia QCDR form completed.        

## 2019-01-01 NOTE — Op Note (Signed)
[facility] Patient Name: Traci Davis Procedure Date: 01/01/2019 8:17 AM MRN: [id number] Account #: [id number] Date of Birth: December 17, 1970 Admit Type: Outpatient Age: 48 Room: [facility] ENDO ROOM 3 Gender: Female Note Status: Finalized Procedure:            Upper GI endoscopy Indications:          Dysphagia, Heartburn Providers:            Lin Landsman MD, MD Referring MD:         Jordan Likes. Lavena Bullion (Referring MD) Medicines:            Monitored Anesthesia Care Complications:        No immediate complications. Estimated blood loss: None. Procedure:            Pre-Anesthesia Assessment:                       - Prior to the procedure, a History and Physical was                        performed, and patient medications and allergies were                        reviewed. The patient is competent. The risks and                        benefits of the procedure and the sedation options and                        risks were discussed with the patient. All questions                        were answered and informed consent was obtained.                        Patient identification and proposed procedure were                        verified by the physician, the nurse, the                        anesthesiologist, the anesthetist and the technician in                        the pre-procedure area in the procedure room in the                        endoscopy suite. Mental Status Examination: alert and                        oriented. Airway Examination: normal oropharyngeal                        airway and neck mobility. Respiratory Examination:                        clear to auscultation. CV Examination: normal.                        Prophylactic Antibiotics: The patient does not require  prophylactic antibiotics. Prior Anticoagulants: The                        patient has taken no previous anticoagulant or   antiplatelet agents. ASA Grade Assessment: III - A                        patient with severe systemic disease. After reviewing                        the risks and benefits, the patient was deemed in                        satisfactory condition to undergo the procedure. The                        anesthesia plan was to use monitored anesthesia care                        (MAC). Immediately prior to administration of                        medications, the patient was re-assessed for adequacy                        to receive sedatives. The heart rate, respiratory rate,                        oxygen saturations, blood pressure, adequacy of                        pulmonary ventilation, and response to care were                        monitored throughout the procedure. The physical status                        of the patient was re-assessed after the procedure.                       After obtaining informed consent, the endoscope was                        passed under direct vision. Throughout the procedure,                        the patient's blood pressure, pulse, and oxygen                        saturations were monitored continuously. The Endoscope                        was introduced through the mouth, and advanced to the                        second part of duodenum. The upper GI endoscopy was                        accomplished without difficulty. The patient tolerated  the procedure fairly well. Findings:      The duodenal bulb and second portion of the duodenum were normal.      The entire examined stomach was normal.      The cardia and gastric fundus were normal on retroflexion.      The gastroesophageal junction and examined esophagus were normal.       Biopsies were taken with a cold forceps for histology.      Esophagogastric landmarks were identified: the gastroesophageal junction       was found at 36 cm from the incisors. Impression:            - Normal duodenal bulb and second portion of the                        duodenum.                       - Normal stomach.                       - Normal gastroesophageal junction and esophagus.                        Biopsied.                       - Esophagogastric landmarks identified. Recommendation:       - Await pathology results.                       - Discharge patient to home (with escort).                       - Diabetic (ADA) diet, low fat diet and low sodium diet.                       - Continue present medications.                       - Follow an antireflux regimen. Procedure Code(s):    --- Professional ---                       [phone], Esophagogastroduodenoscopy, flexible, transoral;                        with biopsy, single or multiple Diagnosis Code(s):    --- Professional ---                       R12, Heartburn                       R13.10, Dysphagia, unspecified CPT copyright 2019 American Medical Association. All rights reserved. The codes documented in this report are preliminary and upon coder review may  be revised to meet current compliance requirements. Dr. Ulyess Mort Lin Landsman MD, MD 01/01/2019 9:54:23 AM This report has been signed electronically. Number of Addenda: 0 Note Initiated On: 01/01/2019 8:17 AM Estimated Blood Loss: Estimated blood loss: none.      [facility]

## 2019-01-01 NOTE — Transfer of Care (Signed)
Immediate Anesthesia Transfer of Care Note  Patient: Traci Davis  Procedure(s) Performed: ESOPHAGOGASTRODUODENOSCOPY (EGD) WITH PROPOFOL (N/A )  Patient Location: Endoscopy Unit  Anesthesia Type:General  Level of Consciousness: sedated and responds to stimulation  Airway & Oxygen Therapy: Patient Spontanous Breathing and Patient connected to nasal cannula oxygen  Post-op Assessment: Report given to RN and Post -op Vital signs reviewed and stable  Post vital signs: Reviewed and stable  Last Vitals:  Vitals Value Taken Time  BP 133/76 01/01/19 0958  Temp 36.2 C 01/01/19 0957  Pulse 54 01/01/19 0959  Resp 21 01/01/19 0959  SpO2 98 % 01/01/19 0959  Vitals shown include unvalidated device data.  Last Pain:  Vitals:   01/01/19 0957  TempSrc: Tympanic  PainSc: Asleep         Complications: No apparent anesthesia complications

## 2019-01-01 NOTE — H&P (Signed)
Traci Darby, MD [address]  [place], [place] [address]  Main: [phone]  Fax: [phone] Pager: [phone]  Primary Care Physician:  Remi Haggard, FNP Primary Gastroenterologist:  Dr. Cephas Davis  Pre-Procedure History & Physical: HPI:  Traci Davis is a 48 y.o. female is here for an endoscopy.   Past Medical History:  Diagnosis Date  . Anxiety   . Arthritis   . Asthma   . Cerebral palsy ([place])   . Complication of anesthesia    panic attacks before surgery  . COPD (chronic obstructive pulmonary disease) ([place])   . Depression   . Diabetes mellitus without complication ([place])   . GERD (gastroesophageal reflux disease)   . Hypertension     Past Surgical History:  Procedure Laterality Date  . CESAREAN SECTION  1995  . CHOLECYSTECTOMY    . DILATATION & CURETTAGE/HYSTEROSCOPY WITH MYOSURE N/A 02/20/2018   Procedure: DILATATION & CURETTAGE/HYSTEROSCOPY WITH MYOSURE ENDOMETRIAL POLYPECTOMY;  Surgeon: Will Bonnet, MD;  Location: [facility];  Service: Gynecology;  Laterality: N/A;  . FOOT CAPSULE RELEASE W/ PERCUTANEOUS HEEL CORD LENGTHENING, TIBIAL TENDON TRANSFER     age 30 ,and 2010-both legs  . JOINT REPLACEMENT Right    both knees partial replacements dates unknown  . knee replacment     x 5 per pt. last one- left knee 2014. has had 2 on R 3 on L  . TYMPANOPLASTY WITH GRAFT Right 01/08/2018   Procedure: TYMPANOPLASTY WITH POSSIBLE OSSICULAR CHAIN RECONSTRUCTION;  Surgeon: [name], MD;  Location: [facility];  Service: ENT;  Laterality: Right;    Prior to Admission medications   Medication Sig Start Date End Date Taking? Authorizing Provider  albuterol (PROVENTIL HFA;VENTOLIN HFA) 108 (90 BASE) MCG/ACT inhaler Inhale 2 puffs into the lungs 4 (four) times daily as needed. For shortness of breath and/or wheezing 09/22/14  Yes [provider]  buPROPion (WELLBUTRIN) 100 MG tablet Take 1 tablet (100 mg total) by mouth every  morning. 12/29/18  Yes Ursula Alert, MD  chlorzoxazone (PARAFON) 500 MG tablet Take 500 mg by mouth 3 (three) times daily as needed for muscle spasms.   Yes [provider]  Cholecalciferol (VITAMIN D3) 5000 units CAPS Take 5,000 Units by mouth daily.   Yes [provider]  clonazePAM (KLONOPIN) 0.5 MG tablet Take 0.5 mg by mouth 2 (two) times daily as needed for anxiety.   Yes [provider]  diclofenac sodium (VOLTAREN) 1 % GEL Apply 1 application topically as needed (apply to painful areas of chest wall). 10/05/18  Yes Carrie Mew, MD  dicyclomine (BENTYL) 10 MG capsule  07/15/17  Yes [provider]  donepezil (ARICEPT) 10 MG tablet Take 10 mg by mouth at bedtime.  05/21/17  Yes [provider]  FLUoxetine (PROZAC) 40 MG capsule Take 1 capsule (40 mg total) by mouth daily. 12/29/18  Yes Ursula Alert, MD  furosemide (LASIX) 40 MG tablet Take 40 mg by mouth daily as needed for fluid.    Yes [provider]  glimepiride (AMARYL) 4 MG tablet Take 4 mg by mouth daily with breakfast.   Yes [provider]  lisinopril (PRINIVIL,ZESTRIL) 20 MG tablet Take 20 mg by mouth daily.   Yes [provider]  omeprazole (PRILOSEC) 40 MG capsule Take 40 mg by mouth daily.    Yes [provider]  oxyCODONE-acetaminophen (PERCOCET/ROXICET) 5-325 MG tablet oxycodone-acetaminophen 5 mg-325 mg tablet   Yes [provider]  traZODone (DESYREL) 100 MG tablet Take 1 tablet (100 mg total) by mouth at bedtime. 12/29/18  Yes Ursula Alert, MD  acetaminophen (TYLENOL) 325 MG tablet Take 2 tablets (650 mg total) by mouth every 6 (six) hours as needed. Patient not taking: Reported on 12/11/2018 10/05/18   Carrie Mew, MD  AMITIZA 24 MCG capsule  10/28/18   [provider]  amoxicillin (AMOXIL) 500 MG capsule  12/16/18   [provider]  amoxicillin-clavulanate (AUGMENTIN) 875-125 MG tablet  12/09/18   [provider]  atorvastatin (LIPITOR) 10 MG tablet  05/26/18   [provider]  Azelastine-Fluticasone 137-50 MCG/ACT SUSP Dymista 137 mcg-50 mcg/spray nasal spray    [provider]  Blood Glucose Monitoring Suppl (GLUCOCOM BLOOD GLUCOSE MONITOR) DEVI Test daily before all meals/snacks and once before bedtime. 09/07/14   [provider]  [place] suspension  12/16/18   [provider]  CREON 36000 units CPEP capsule  03/26/18   [provider]  glucose blood (ONE TOUCH ULTRA TEST) test strip USE TO TEST BLOOD SUGAR TWO TIMES A DAY 12/07/14   [provider]  HYDROmorphone (DILAUDID) 2 MG tablet Take 2 mg by mouth 3 (three) times daily as needed for severe pain.    [provider]  hydrOXYzine (VISTARIL) 25 MG capsule Take 1 capsule (25 mg total) by mouth 3 (three) times daily as needed for anxiety. Only for severe anxiety attacks Patient not taking: Reported on 01/01/2019 03/04/18   Ursula Alert, MD  incobotulinumtoxinA (XEOMIN) 100 units SOLR injection Inject 100 Units into the muscle every 3 (three) months.  03/29/15   [provider]  Lancets (FREESTYLE) lancets Test daily before all meals/snacks 09/07/14   [provider]  lidocaine (LIDODERM) 5 %  08/21/18   [provider]  nitrofurantoin (MACRODANTIN) 100 MG capsule  03/04/18   [provider]  nystatin (MYCOSTATIN/NYSTOP) powder Apply 1 g topically daily as needed (rash).  04/10/13   [provider]  ofloxacin (OCUFLOX) 0.3 % ophthalmic solution Place 4 drops into the right ear 2 (two) times daily. Patient not taking: Reported on 12/11/2018 01/08/18   [name], MD  oxyCODONE (OXY IR/ROXICODONE) 5 MG immediate release tablet  12/19/18   [provider]  pioglitazone (ACTOS) 45 MG tablet Take 45 mg by mouth daily.    [provider]  polyethylene glycol (MIRALAX / GLYCOLAX) packet Take 17 gm mixed in liqiud as needed  for constipation 09/28/15   [provider]  pregabalin (LYRICA) 25 MG capsule Take 50 mg by mouth at bedtime.    [provider]  solifenacin (VESICARE) 10 MG tablet Take 10 mg by mouth daily.    [provider]  Tiotropium Bromide Monohydrate (SPIRIVA RESPIMAT) 1.25 MCG/ACT AERS Inhale 1 puff into the lungs daily.    [provider]  topiramate (TOPAMAX) 100 MG tablet Take 100 mg by mouth 2 (two) times daily.  07/17/16   [provider]    Allergies as of 12/11/2018 - Review Complete 12/11/2018  Allergen Reaction Noted  . Meloxicam Other (See Comments) 02/19/2015  . Morphine and related Other (See Comments) 02/19/2015  . Vantin [cefpodoxime] Nausea And Vomiting 02/19/2015  . Latex Itching 01/08/2018  . Baclofen Other (See Comments) 02/19/2015  . Ciprofloxacin Itching and Nausea And Vomiting 02/19/2015  . Tape Rash 02/19/2015    Family History  Problem Relation Age of Onset  . CAD Father   . Heart attack Brother   .  Sexual abuse Paternal Grandfather   . Breast cancer Neg Hx     Social History   Socioeconomic History  . Marital status: Single    Spouse name: Not on file  . Number of children: Not on file  . Years of education: Not on file  . Highest education level: Not on file  Occupational History  . Not on file  Social Needs  . Financial resource strain: Not on file  . Food insecurity    Worry: Not on file    Inability: Not on file  . Transportation needs    Medical: Not on file    Non-medical: Not on file  Tobacco Use  . Smoking status: Never Smoker  . Smokeless tobacco: Never Used  Substance and Sexual Activity  . Alcohol use: No  . Drug use: No  . Sexual activity: Yes    Partners: Male    Birth control/protection: Condom  Lifestyle  . Physical activity    Days per week: Not on file    Minutes per session: Not on file  . Stress: Not on file  Relationships  . Social Herbalist on phone: Not on file     Gets together: Not on file    Attends religious service: Not on file    Active member of club or organization: Not on file    Attends meetings of clubs or organizations: Not on file    Relationship status: Not on file  . Intimate partner violence    Fear of current or ex partner: Not on file    Emotionally abused: Not on file    Physically abused: Not on file    Forced sexual activity: Not on file  Other Topics Concern  . Not on file  Social History Narrative  . Not on file    Review of Systems: See HPI, otherwise negative ROS  Physical Exam: BP (!) 132/92   Pulse 66   Temp 97.8 F (36.6 C) (Tympanic)   Resp 16   Ht 5\' 2"  (1.575 m)   Wt 99.8 kg   LMP 08/20/2014 Comment: missed cup when trying to obtain urine spec  BMI 40.24 kg/m  General:   Alert,  pleasant and cooperative in NAD Head:  Normocephalic and atraumatic. Neck:  Supple; no masses or thyromegaly. Lungs:  Clear throughout to auscultation.    Heart:  Regular rate and rhythm. Abdomen:  Soft, nontender and nondistended. Normal bowel sounds, without guarding, and without rebound.   Neurologic:  Alert and  oriented x4;  grossly normal neurologically.  Impression/Plan: Traci Davis is here for an endoscopy to be performed for dysphagia, Barrett's screening  Risks, benefits, limitations, and alternatives regarding  endoscopy have been reviewed with the patient.  Questions have been answered.  All parties agreeable.   Sherri Sear, MD  01/01/2019, 8:42 AM

## 2019-01-01 NOTE — Anesthesia Preprocedure Evaluation (Signed)
Anesthesia Evaluation  Patient identified by MRN, date of birth, ID band Patient awake    Reviewed: Allergy & Precautions, H&P , NPO status , Patient's Chart, lab work & pertinent test results, reviewed documented beta blocker date and time   History of Anesthesia Complications (+) history of anesthetic complications  Airway Mallampati: II   Neck ROM: full    Dental  (+) Poor Dentition   Pulmonary neg pulmonary ROS, asthma , COPD,    Pulmonary exam normal        Cardiovascular Exercise Tolerance: Poor hypertension, On Medications negative cardio ROS Normal cardiovascular exam Rhythm:regular Rate:Normal     Neuro/Psych  Headaches, PSYCHIATRIC DISORDERS Anxiety Depression negative neurological ROS  negative psych ROS   GI/Hepatic negative GI ROS, Neg liver ROS, GERD  Medicated,  Endo/Other  diabetes, Well Controlled, Type 2, Oral Hypoglycemic AgentsMorbid obesity  Renal/GU Renal diseasenegative Renal ROS  negative genitourinary   Musculoskeletal   Abdominal   Peds  Hematology negative hematology ROS (+)   Anesthesia Other Findings Past Medical History: No date: Anxiety No date: Arthritis No date: Asthma No date: Cerebral palsy ([facility]) No date: Complication of anesthesia     Comment:  panic attacks before surgery No date: COPD (chronic obstructive pulmonary disease) ([facility]) No date: Depression No date: Diabetes mellitus without complication ([facility]) No date: GERD (gastroesophageal reflux disease) No date: Hypertension Past Surgical History: 1995: CESAREAN SECTION No date: CHOLECYSTECTOMY 02/20/2018: DILATATION & CURETTAGE/HYSTEROSCOPY WITH MYOSURE; N/A     Comment:  Procedure: DILATATION & CURETTAGE/HYSTEROSCOPY WITH               MYOSURE ENDOMETRIAL POLYPECTOMY;  Surgeon: Will Bonnet, MD;  Location: [facility];  Service: Gynecology;              Laterality: N/A; No date: FOOT CAPSULE RELEASE  W/ PERCUTANEOUS HEEL CORD LENGTHENING,  TIBIAL TENDON TRANSFER     Comment:  age 48 ,and 2010-both legs No date: JOINT REPLACEMENT; Right     Comment:  both knees partial replacements dates unknown No date: knee replacment     Comment:  x 5 per pt. last one- left knee 2014. has had 2 on R 3               on L 01/08/2018: TYMPANOPLASTY WITH GRAFT; Right     Comment:  Procedure: TYMPANOPLASTY WITH POSSIBLE OSSICULAR CHAIN               RECONSTRUCTION;  Surgeon: [name], MD;  Location:               [facility];  Service: ENT;  Laterality: Right; BMI    Body Mass Index: 40.24 kg/m     Reproductive/Obstetrics negative OB ROS                             Anesthesia Physical Anesthesia Plan  ASA: III  Anesthesia Plan: General   Post-op Pain Management:    Induction:   PONV Risk Score and Plan:   Airway Management Planned:   Additional Equipment:   Intra-op Plan:   Post-operative Plan:   Informed Consent: I have reviewed the patients History and Physical, chart, labs and discussed the procedure including the risks, benefits and alternatives for the proposed anesthesia with the patient or authorized representative who has indicated his/her understanding and acceptance.  Dental Advisory Given  Plan Discussed with: CRNA  Anesthesia Plan Comments:         Anesthesia Quick Evaluation

## 2019-01-02 ENCOUNTER — Encounter: Payer: Self-pay | Admitting: Gastroenterology

## 2019-01-02 LAB — SURGICAL PATHOLOGY

## 2019-01-06 NOTE — Anesthesia Postprocedure Evaluation (Signed)
Anesthesia Post Note  Patient: Traci Davis  Procedure(s) Performed: ESOPHAGOGASTRODUODENOSCOPY (EGD) WITH PROPOFOL (N/A )  Patient location during evaluation: PACU Anesthesia Type: General Level of consciousness: patient uncooperative Pain management: pain level controlled Vital Signs Assessment: post-procedure vital signs reviewed and stable Respiratory status: spontaneous breathing, nonlabored ventilation, respiratory function stable and patient connected to nasal cannula oxygen Cardiovascular status: blood pressure returned to baseline and stable Postop Assessment: no apparent nausea or vomiting Anesthetic complications: no     Last Vitals:  Vitals:   01/01/19 1017 01/01/19 1027  BP: (!) 144/82 (!) 159/75  Pulse: 74 62  Resp: 19 15  Temp:    SpO2: 96% 96%    Last Pain:  Vitals:   01/01/19 1027  TempSrc:   PainSc: 0-No pain                 Molli Barrows

## 2019-01-07 ENCOUNTER — Encounter: Payer: Self-pay | Admitting: Gastroenterology

## 2019-01-09 ENCOUNTER — Other Ambulatory Visit: Payer: Self-pay | Admitting: Family Medicine

## 2019-01-09 DIAGNOSIS — Z1231 Encounter for screening mammogram for malignant neoplasm of breast: Secondary | ICD-10-CM

## 2019-02-02 ENCOUNTER — Ambulatory Visit: Payer: Medicare Other | Attending: Physician Assistant | Admitting: Physical Therapy

## 2019-02-02 ENCOUNTER — Other Ambulatory Visit: Payer: Self-pay

## 2019-02-02 ENCOUNTER — Encounter: Payer: Self-pay | Admitting: Physical Therapy

## 2019-02-02 DIAGNOSIS — M6281 Muscle weakness (generalized): Secondary | ICD-10-CM | POA: Insufficient documentation

## 2019-02-02 DIAGNOSIS — R262 Difficulty in walking, not elsewhere classified: Secondary | ICD-10-CM | POA: Insufficient documentation

## 2019-02-02 DIAGNOSIS — M25561 Pain in right knee: Secondary | ICD-10-CM | POA: Diagnosis not present

## 2019-02-02 DIAGNOSIS — M25511 Pain in right shoulder: Secondary | ICD-10-CM | POA: Insufficient documentation

## 2019-02-02 DIAGNOSIS — G8929 Other chronic pain: Secondary | ICD-10-CM | POA: Diagnosis present

## 2019-02-02 DIAGNOSIS — M62838 Other muscle spasm: Secondary | ICD-10-CM | POA: Diagnosis present

## 2019-02-02 NOTE — Therapy (Signed)
[facility] [address], [place], [address] Phone: [phone]   Fax:  [phone]  Physical Therapy Evaluation  Patient Details  Name: Traci Davis MRN: [id number] Date of Birth: [birth year] Referring Provider (PT): Gearldine Shown, [place]   Encounter Date: 02/02/2019  PT End of Session - 02/03/19 [id number]    Visit Number  1    Number of Visits  24    Date for PT Re-Evaluation  04/27/19    Authorization Type  [employer] MEDICARE reporting from 02/02/2019    Authorization Time Period  certification period: 02/02/2019 - 04/27/2019    Authorization - Visit Number  1    Authorization - Number of Visits  10    PT Start Time  1608    PT Stop Time  1702    PT Time Calculation (min)  54 min    Activity Tolerance  Patient tolerated treatment well;Patient limited by pain    Behavior During Therapy  [facility] for tasks assessed/performed       Past Medical History:  Diagnosis Date  . Anxiety   . Arthritis   . Asthma   . Cerebral palsy ([place])   . Complication of anesthesia    panic attacks before surgery  . COPD (chronic obstructive pulmonary disease) ([place])   . Depression   . Diabetes mellitus without complication ([place])   . GERD (gastroesophageal reflux disease)   . Hypertension     Past Surgical History:  Procedure Laterality Date  . CESAREAN SECTION  1995  . CHOLECYSTECTOMY    . DILATATION & CURETTAGE/HYSTEROSCOPY WITH MYOSURE N/A 02/20/2018   Procedure: DILATATION & CURETTAGE/HYSTEROSCOPY WITH MYOSURE ENDOMETRIAL POLYPECTOMY;  Surgeon: Will Bonnet, MD;  Location: [facility] ORS;  Service: Gynecology;  Laterality: N/A;  . ESOPHAGOGASTRODUODENOSCOPY (EGD) WITH PROPOFOL N/A 01/01/2019   Procedure: ESOPHAGOGASTRODUODENOSCOPY (EGD) WITH PROPOFOL;  Surgeon: Lin Landsman, MD;  Location: [facility] ENDOSCOPY;  Service: Gastroenterology;  Laterality: N/A;  . FOOT CAPSULE RELEASE W/ PERCUTANEOUS HEEL CORD LENGTHENING,  TIBIAL TENDON TRANSFER     age 48 ,and 2010-both legs  . JOINT REPLACEMENT Right    both knees partial replacements dates unknown  . knee replacment     x 5 per pt. last one- left knee 2014. has had 2 on R 3 on L  . TYMPANOPLASTY WITH GRAFT Right 01/08/2018   Procedure: TYMPANOPLASTY WITH POSSIBLE OSSICULAR CHAIN RECONSTRUCTION;  Surgeon: [name], MD;  Location: [facility] ORS;  Service: ENT;  Laterality: Right;    There were no vitals filed for this visit.   Subjective Assessment - 02/02/19 1630    Subjective  Patient reports the onset of pain since many years ago located at R knee after partial knee replacement. The recent flare up starting around march 2020 and has been gradually worsening. The pain is currently 6/10, and the worse pain can go up to 10/10; lowest pain is 3/10. Patient reports aggravating factors including: prolonged sitting/standing/walking. Patient has been trying the following methods to relief the pain: multiple resting breaks and taping methods taught by her cerebral palsy doctor(reduce pain to 3/10). However, none of the above methods had long lasting effect towards pain relief. Patient is currently not working. Patient enjoys playing with her grandchildren.    Pertinent History  Patient is a 48 y.o. female who presents to outpatient physical therapy with a referral for medical diagnosis of R osteoarthritis knee pain. This patient's chief complaints consist of constant pain R knee  that has been gradually increasing in severity since many years ago, leading to the following functional deficits: difficulty with housekeeping activities, prolonged sitting, quality of life, lifting, etc.. Relevant past medical history and comorbidities include anxiety, COPD, cerebral palsy, depression, diabetes mellitus; surgeries include: bilateral partial knee replacement, foot capsule release with percutaneous heel cord lengthening,    Limitations  Sitting;Lifting;Standing;Walking;House hold  activities    How long can you sit comfortably?  45 minutes    How long can you stand comfortably?  3-5 minutes    How long can you walk comfortably?  10-15 minutes    Diagnostic tests  X-rays    Patient Stated Goals  To get up and walking    Currently in Pain?  Yes    Pain Score  6     Pain Location  Knee    Pain Orientation  Right    Pain Descriptors / Indicators  Aching    Pain Onset  More than a month ago    Pain Frequency  Constant    Aggravating Factors   Prolonged walking, sitting, washing dishies, folding clothes.    Pain Relieving Factors  knee bandage wraping; rest    Effect of Pain on Daily Activities  limits daily tasks personal care, household activities, lifting, using walker for ambulation, difficulties walking to garage      .    [facility] PT Assessment - 02/03/19 0001      Assessment   Medical Diagnosis   osteoarthritis of R knee.     Referring Provider (PT)  covington, Tessie Eke, PA    Onset Date/Surgical Date  --   Most recent flare up of R knee pain around 06/2018   Hand Dominance  Right    Prior Therapy  R shoulder pain in 2018-2019      Precautions   Precautions  None      Restrictions   Weight Bearing Restrictions  No      Home Environment   Living Environment  Private residence    Living Arrangements  Spouse/significant other   boyfriend   Available Help at Discharge  Home health   Monday to Friday 10-1pm     Prior Function   Level of Independence  Needs assistance with ADLs;Needs assistance with homemaking    Dressing  Moderate    Meal Prep  Moderate    Laundry  Maximal    Vacuuming  Maximal      Cognition   Overall Cognitive Status  Within Functional Limits for tasks assessed   Anxious      Observation/Other Assessments   Observations  see note from 02/02/2019 for latest objective data    Focus on Therapeutic Outcomes (FOTO)   FOTO = 33       OBJECTIVE  MUSCULOSKELETAL: Tremor: Absent  Posture Difficulty standing upright,  requires rolling walker as baseline. Sitting with slouched posture, able to correct but difficult to maintain due to lack of muscle endurance and history of cerebral palsy.   Gait       Difficulties walking with abnormal gait knee valgus at R LE due to history of cerebral palsy. L LE using KAFO as baseline.   Palpation Significant pain at posterior R knee joint line. Palpable mass   Strength R LE tested only.  2+/5 Hip flexion 4/5 Hip abduction (modified testing position in sitting) 4/5 Hip adduction (modified testing position in sitting) 4/5 Knee extension 3+/5 Knee flexion 4+/5 Ankle Dorsiflexion 4+/5 Ankle Plantarflexion 4+/5 Ankle Inversion 4-/5  Ankle Eversion *indicates pain  AROM Knee R: WFL   FUNCTIONAL TESTS Sit to stand: 5x 43 s from 18.5 inch tall green chair. With BUE support and L lE knee locked KAFO.  6 minutes walking test: deferred to next visit.        Objective measurements completed on examination: See above findings.     Therapeutic exercise: to centralize symptoms and improve ROM, strength, muscular endurance, and activity tolerance required for successful completion of functional activities.  - seated March x 10 reps. Cuing provided to improve TA activation.  - Education on HEP including handout  - Patient was educated on diagnosis, anatomy and pathology involved, prognosis, role of PT, and was given an HEP, demonstrating exercise with proper form following verbal and tactile cues, and was given a paper hand out to continue exercise at home. Pt was educated on and agreed to plan of care.     HOME EXERCISE PROGRAM  Access Code: [email or web address]  URL: [email or web address]  Date: 02/03/2019  Prepared by: Rosita Kea   Exercises Seated March - 3 sets - 14min hold - 1x daily - 7x weekly    PT Education - 02/03/19 [id number]    Education provided  Yes    Education Details  Education: Exercise purpose/form. Self management techniques. Education on  diagnosis, prognosis, POC, anatomy and physiology of current condition Education on HEP including handout    Person(s) Educated  Patient    Methods  Explanation;Demonstration;Tactile cues;Verbal cues;Handout    Comprehension  Verbalized understanding;Returned demonstration;Verbal cues required;Tactile cues required       PT Short Term Goals - 02/03/19 0859      PT SHORT TERM GOAL #1   Title  Be independent with initial home exercise program for self-management of symptoms    Baseline  initial HEP provided at IE (02/02/2019);    Time  3    Period  Weeks    Status  New    Target Date  02/23/19        PT Long Term Goals - 02/03/19 0901      PT LONG TERM GOAL #1   Title  Be independent with a long-term home exercise program for self-management of symptoms.    Baseline  initial HEP provided at IE (02/02/2019);    Time  12    Period  Weeks    Status  New    Target Date  04/27/19      PT LONG TERM GOAL #2   Title  Demonstrate improved FOTO score by 10 units to demonstrate improvement in overall condition and self-reported functional ability.    Baseline  FOTO = 33(02/02/2019);    Time  12    Period  Weeks    Status  New    Target Date  04/27/19      PT LONG TERM GOAL #3   Title  Reduce pain with functional activities to equal or less than 1/10 to allow patient to complete usual activities including ADLs, IADLs, and social engagement with less difficulty.    Baseline  6/10 and 10/10 with prolonged walking (02/02/2019);    Time  12    Period  Weeks    Status  New    Target Date  04/27/19      PT LONG TERM GOAL #4   Title  Improved 6 minute walk test by 10% indicating improved LEs functional mobility and strength.    Baseline  Deferred to later session (02/02/2019)    Time  12    Period  Weeks    Status  New    Target Date  04/27/19      PT LONG TERM GOAL #5   Title  Patient will demonstrated 5 time sit to stand with BUEs support within 20s to reduce fall risks and  improve LE functional strength.    Baseline  43s with BUEs (from 18.5inch chair) (02/02/2019);    Time  12    Period  Weeks    Status  New    Target Date  04/27/19             Plan - 02/03/19 0845    Clinical Impression Statement  Patient is a 48 y.o. female who presents to outpatient physical therapy with a referral for medical diagnosis of osteoarthritis of R knee. This patient presents with the sign and symptoms consistent with R knee pain, stiffness, muscle spasm with functional activities/recreational activities. Upon assessment, pt demonstrated deficits such as significant tenderness to palpation of R posterior knee, as well as deficits in strength, weakness, significant pain with mobility. These deficits limit the patient ability to perform things such as ADLs, IADLs, social participation, caring for and playing with children, engaging in hobbies (playing with kids), and impairs their quality of life. The pt will benefit from skilled PT services to address deficits and return to PLOF and independence, recreational activity and work.    Personal Factors and Comorbidities  Comorbidity 3+    Comorbidities  anxiety, COPD, cerebral palsy, depression, diabetes mellitus, multiple knee surgeries    Examination-Activity Limitations  Bathing;Continence;Stairs;Stand;Dressing;Bend;Sit;Toileting;Squat;Locomotion Level;Transfers;Lift;Caring for Others    Examination-Participation Restrictions  Driving;Yard Work;Cleaning;Community Activity;Meal Prep;Laundry    Stability/Clinical Decision Making  Evolving/Moderate complexity    Clinical Decision Making  Moderate    Rehab Potential  Fair    PT Frequency  2x / week    PT Duration  12 weeks    PT Treatment/Interventions  ADLs/Self Care Home Management;Cryotherapy;Electrical Stimulation;Iontophoresis 4mg /ml Dexamethasone;Gait training;Stair training;Functional mobility training;Therapeutic activities;Therapeutic exercise;Balance training;Neuromuscular  re-education;Patient/family education;Orthotic Fit/Training;Manual techniques;Scar mobilization;Dry needling;Energy conservation;Joint Manipulations;Other (comment)    PT Next Visit Plan  pain control, strengthening, HEP update    PT Home Exercise Plan  Medbridge: [email or web address]    Consulted and Agree with Plan of Care  Patient       Patient will benefit from skilled therapeutic intervention in order to improve the following deficits and impairments:  Impaired UE functional use, Impaired perceived functional ability, Decreased strength, Increased muscle spasms, Pain, Abnormal gait, Decreased activity tolerance, Decreased endurance, Decreased balance, Decreased coordination, Difficulty walking, Decreased mobility  Visit Diagnosis: Chronic pain of right knee  Muscle weakness (generalized)  Difficulty in walking, not elsewhere classified     Problem List Patient Active Problem List   Diagnosis Date Noted  . MDD (major depressive disorder), recurrent episode, mild ([place]) 12/29/2018  . Insomnia due to mental condition 12/29/2018  . Disorder of vein 03/04/2018  . Lumbar sprain 03/04/2018  . Muscle weakness 03/04/2018  . Neck sprain 03/04/2018  . 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  . Postmenopausal bleeding 02/18/2018  . Impingement syndrome of shoulder region 02/04/2018  . Chest pain with moderate risk for cardiac etiology 05/19/2017  . History of depression 04/15/2017  . GAD (generalized anxiety disorder) 04/15/2017  . Chronic daily headache 04/15/2017  . Panic attack 04/15/2017  . Nocturnal enuresis 04/15/2017  . Mild cognitive impairment 04/15/2017  . Loss of memory 01/14/2017  . Pain medication agreement signed 01/08/2017  . Headache disorder 12/04/2016  .  Chronic, continuous use of opioids 07/01/2015  . Vertigo 07/01/2015  . Chronic midline low back pain without sciatica 03/21/2015  . Acute renal failure (ARF) ([place]) 02/19/2015  . Type 2 diabetes mellitus without complication ([place])  [id number]  . Acute pancreatitis 09/04/2014  . Cerebral palsy ([place]) 01/05/2014  . 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 Hip pain, chronic 04/28/2013  . Essential hypertension 01/10/2013  . Irritable colon 01/10/2013  . Noninfectious gastroenteritis and colitis 01/10/2013  . Encounter for long-term (current) use of other medications 12/04/2012  . Chronic GERD 08/12/2012  . Epigastric pain 08/12/2012  . Diarrhea 08/12/2012  . Primary localized osteoarthrosis, lower leg 05/07/2012  . Difficulty walking 02/06/2012   .Sherrilyn Rist, SPT 02/03/19, 1:14 PM  Everlean Alstrom. Graylon Good, PT, DPT 02/03/19, 1:14 PM   [facility] [address] [place], [address] Phone: [phone]   Fax:  [phone]  Name: Traci Davis MRN: [id number] Date of Birth: 06-26-1970

## 2019-02-04 ENCOUNTER — Other Ambulatory Visit: Payer: Self-pay

## 2019-02-04 ENCOUNTER — Encounter: Payer: Self-pay | Admitting: Physical Therapy

## 2019-02-04 ENCOUNTER — Ambulatory Visit: Payer: Medicare Other | Admitting: Physical Therapy

## 2019-02-04 DIAGNOSIS — R262 Difficulty in walking, not elsewhere classified: Secondary | ICD-10-CM

## 2019-02-04 DIAGNOSIS — M25561 Pain in right knee: Secondary | ICD-10-CM | POA: Diagnosis not present

## 2019-02-04 DIAGNOSIS — G8929 Other chronic pain: Secondary | ICD-10-CM

## 2019-02-04 DIAGNOSIS — M6281 Muscle weakness (generalized): Secondary | ICD-10-CM

## 2019-02-04 NOTE — Therapy (Signed)
[facility] [address] [place], [address] Phone: [phone]   Fax:  [phone]  Physical Therapy Treatment  Patient Details  Name: Traci Davis MRN: [id number] Date of Birth: March 03, 1971 Referring Provider (PT): Gearldine Shown, [place]   Encounter Date: 02/04/2019  PT End of Session - 02/04/19 1652    Visit Number  2    Number of Visits  24    Date for PT Re-Evaluation  04/27/19    Authorization Type  [employer] MEDICARE reporting from 02/02/2019    Authorization Time Period  certification period: 02/02/2019 - 04/27/2019    Authorization - Visit Number  2    Authorization - Number of Visits  10    PT Start Time  1652    PT Stop Time  1732    PT Time Calculation (min)  40 min    Equipment Utilized During Treatment  Gait belt    Activity Tolerance  Patient tolerated treatment well;Patient limited by pain    Behavior During Therapy  [facility] for tasks assessed/performed       Past Medical History:  Diagnosis Date  . Anxiety   . Arthritis   . Asthma   . Cerebral palsy ([place])   . Complication of anesthesia    panic attacks before surgery  . COPD (chronic obstructive pulmonary disease) ([place])   . Depression   . Diabetes mellitus without complication ([place])   . GERD (gastroesophageal reflux disease)   . Hypertension     Past Surgical History:  Procedure Laterality Date  . CESAREAN SECTION  1995  . CHOLECYSTECTOMY    . DILATATION & CURETTAGE/HYSTEROSCOPY WITH MYOSURE N/A 02/20/2018   Procedure: DILATATION & CURETTAGE/HYSTEROSCOPY WITH MYOSURE ENDOMETRIAL POLYPECTOMY;  Surgeon: Will Bonnet, MD;  Location: [facility] ORS;  Service: Gynecology;  Laterality: N/A;  . ESOPHAGOGASTRODUODENOSCOPY (EGD) WITH PROPOFOL N/A 01/01/2019   Procedure: ESOPHAGOGASTRODUODENOSCOPY (EGD) WITH PROPOFOL;  Surgeon: Lin Landsman, MD;  Location: [facility] ENDOSCOPY;  Service: Gastroenterology;  Laterality: N/A;  . FOOT CAPSULE  RELEASE W/ PERCUTANEOUS HEEL CORD LENGTHENING, TIBIAL TENDON TRANSFER     age 48 ,and 2010-both legs  . JOINT REPLACEMENT Right    both knees partial replacements dates unknown  . knee replacment     x 5 per pt. last one- left knee 2014. has had 2 on R 3 on L  . TYMPANOPLASTY WITH GRAFT Right 01/08/2018   Procedure: TYMPANOPLASTY WITH POSSIBLE OSSICULAR CHAIN RECONSTRUCTION;  Surgeon: [name], MD;  Location: [facility] ORS;  Service: ENT;  Laterality: Right;    There were no vitals filed for this visit.  Subjective Assessment - 02/04/19 1651    Subjective  Patient reports pain at R knee today is 5/10 and she put her bandage on to reduce the pain. Patient states she would be late for following appointments due to excessive time spent transfer from room to her car and from her car to the clinic.    Pertinent History  Patient is a 48 y.o. female who presents to outpatient physical therapy with a referral for medical diagnosis of R osteoarthritis knee pain. This patient's chief complaints consist of constant pain R knee that has been gradually increasing in severity since many years ago, leading to the following functional deficits: difficulty with housekeeping activities, prolonged sitting, quality of life, lifting, etc.. Relevant past medical history and comorbidities include anxiety, COPD, cerebral palsy, depression, diabetes mellitus; surgeries include: bilateral partial knee replacement, foot capsule release  with percutaneous heel cord lengthening,    Limitations  Sitting;Lifting;Standing;Walking;House hold activities    How long can you sit comfortably?  45 minutes    How long can you stand comfortably?  3-5 minutes    How long can you walk comfortably?  10-15 minutes    Diagnostic tests  X-rays    Patient Stated Goals  To get up and walking    Currently in Pain?  Yes    Pain Location  Knee    Pain Orientation  Right    Pain Onset  More than a month ago         [facility] PT Assessment -  02/04/19 0001      6 Minute Walk- Baseline   6 Minute Walk- Baseline  yes   330 feet with L orthosis and rolling walking.      TREATMENT:   Therapeutic exercise: to centralize symptoms and improve ROM, strength, muscular endurance, and activity tolerance required for successful completion of functional activities.  - 6 minutes walk test: 330 feet with rolling walker as patient's baseline. Multiple standing rest breaks due to patient's fatigue on BUEs support on RW.   - L Knee extension x5 AROM/PROM lack of 5 degrees to neutral, no pain - L Knee flexion AROM/PROM 98 degrees of flexion. Limited due to pain. Report tightness   - Side stepping with BUEs support at stationary bar at treadmill 8 feet x 4 reps CGA. Cuing to improve body posture.  - Extensive resting breaks due to patient low tolerance of activities.  -Education on HEP including updated handout.  -Patient was educated on diagnosis, anatomy and pathology involved, and prognosis. Pt was educated on and agreed to plan of care.   Manual therapy: to reduce pain and tissue tension, improve range of motion, neuromodulation, in order to promote improved ability to complete functional activities. - STM at anterior and posterior of the knee, traction of the R knee, PROM of R knee flexion/extension to improve ROM, pain control and prime for prescribed exercises. Patient reports reduced R knee pain at the end of manual therapy.    HOME EXERCISE PROGRAM Access Code: [email or web address]  URL: [email or web address]  Date: 02/04/2019  Prepared by: Rosita Kea   Exercises Seated March - 3 sets - 84min hold - 1x daily - 7x weekly Side Stepping with Counter Support - 5 minutes - 1x daily - 7x weekly  Clinical impression:   Patient tolerate the session well with reduced R knee pain at the end of the session. Patient able to complete 6 minutes walking test but requires multiple resting breaks which requires additional muscle endurance training  to improve patient's activity tolerance. Patient also able to complete prescribed HEP at clinic and demonstrated good forms and techniques with cuing. Will continue focus on muscle strengthening and endurance training, and pain management at R knee. The pt will benefit from skilled PT services to address deficits and return to PLOF and independence, recreational activity and work.    PT Education - 02/04/19 1652    Education provided  Yes    Education Details  Education: Exercise purpose/form. Self management techniques.    Person(s) Educated  Patient    Methods  Explanation;Demonstration;Tactile cues;Verbal cues    Comprehension  Verbalized understanding;Returned demonstration;Verbal cues required;Tactile cues required       PT Short Term Goals - 02/03/19 0859      PT SHORT TERM GOAL #1   Title  Be independent with initial home exercise program for  self-management of symptoms    Baseline  initial HEP provided at IE (02/02/2019);    Time  3    Period  Weeks    Status  New    Target Date  02/23/19        PT Long Term Goals - 02/03/19 0901      PT LONG TERM GOAL #1   Title  Be independent with a long-term home exercise program for self-management of symptoms.    Baseline  initial HEP provided at IE (02/02/2019);    Time  12    Period  Weeks    Status  New    Target Date  04/27/19      PT LONG TERM GOAL #2   Title  Demonstrate improved FOTO score by 10 units to demonstrate improvement in overall condition and self-reported functional ability.    Baseline  FOTO = 33(02/02/2019);    Time  12    Period  Weeks    Status  New    Target Date  04/27/19      PT LONG TERM GOAL #3   Title  Reduce pain with functional activities to equal or less than 1/10 to allow patient to complete usual activities including ADLs, IADLs, and social engagement with less difficulty.    Baseline  6/10 and 10/10 with prolonged walking (02/02/2019);    Time  12    Period  Weeks    Status  New     Target Date  04/27/19      PT LONG TERM GOAL #4   Title  Improved 6 minute walk test by 10% indicating improved LEs functional mobility and strength.    Baseline  Deferred to later session (02/02/2019)    Time  12    Period  Weeks    Status  New    Target Date  04/27/19      PT LONG TERM GOAL #5   Title  Patient will demonstrated 5 time sit to stand with BUEs support within 20s to reduce fall risks and improve LE functional strength.    Baseline  43s with BUEs (from 18.5inch chair) (02/02/2019);    Time  12    Period  Weeks    Status  New    Target Date  04/27/19            Plan - 02/04/19 2003    Clinical Impression Statement  Patient tolerate the session well with reduced R knee pain at the end of the session. Patient able to complete 6 minutes walking test but requires multiple resting breaks which requires additional muscle endurance training to improve patient's activity tolerance. Patient also able to complete prescribed HEP at clinic and demonstrated good forms and techniques with cuing. Will continue focus on muscle strengthening and endurance training, and pain management at R knee. The pt will benefit from skilled PT services to address deficits and return to PLOF and independence, recreational activity and work    Personal Factors and Comorbidities  Comorbidity 3+    Comorbidities  anxiety, COPD, cerebral palsy, depression, diabetes mellitus, multiple knee surgeries    Examination-Activity Limitations  Bathing;Continence;Stairs;Stand;Dressing;Bend;Sit;Toileting;Squat;Locomotion Level;Transfers;Lift;Caring for Others    Examination-Participation Restrictions  Driving;Yard Work;Cleaning;Community Activity;Meal Prep;Laundry    Stability/Clinical Decision Making  Evolving/Moderate complexity    Rehab Potential  Fair    PT Frequency  2x / week    PT Duration  12 weeks    PT Treatment/Interventions  ADLs/Self Care Home Management;Cryotherapy;Electrical  Stimulation;Iontophoresis 4mg /ml  Dexamethasone;Gait training;Stair training;Functional mobility training;Therapeutic activities;Therapeutic exercise;Balance training;Neuromuscular re-education;Patient/family education;Orthotic Fit/Training;Manual techniques;Scar mobilization;Dry needling;Energy conservation;Joint Manipulations;Other (comment)    PT Next Visit Plan  pain control, strengthening, HEP update    PT Home Exercise Plan  Medbridge: [email or web address]    Consulted and Agree with Plan of Care  Patient       Patient will benefit from skilled therapeutic intervention in order to improve the following deficits and impairments:  Impaired UE functional use, Impaired perceived functional ability, Decreased strength, Increased muscle spasms, Pain, Abnormal gait, Decreased activity tolerance, Decreased endurance, Decreased balance, Decreased coordination, Difficulty walking, Decreased mobility  Visit Diagnosis: Chronic pain of right knee  Muscle weakness (generalized)  Difficulty in walking, not elsewhere classified     Problem List Patient Active Problem List   Diagnosis Date Noted  . MDD (major depressive disorder), recurrent episode, mild ([place]) 12/29/2018  . Insomnia due to mental condition 12/29/2018  . Disorder of vein 03/04/2018  . Lumbar sprain 03/04/2018  . Muscle weakness 03/04/2018  . Neck sprain 03/04/2018  . 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  . Postmenopausal bleeding 02/18/2018  . Impingement syndrome of shoulder region 02/04/2018  . Chest pain with moderate risk for cardiac etiology 05/19/2017  . History of depression 04/15/2017  . GAD (generalized anxiety disorder) 04/15/2017  . Chronic daily headache 04/15/2017  . Panic attack 04/15/2017  . Nocturnal enuresis 04/15/2017  . Mild cognitive impairment 04/15/2017  . Loss of memory 01/14/2017  . Pain medication agreement signed 01/08/2017  . Headache disorder 12/04/2016  . Chronic, continuous use of opioids 07/01/2015  . Vertigo  07/01/2015  . Chronic midline low back pain without sciatica 03/21/2015  . Acute renal failure (ARF) ([place]) 02/19/2015  . Type 2 diabetes mellitus without complication ([place]) [id number]  . Acute pancreatitis 09/04/2014  . Cerebral palsy ([place]) 01/05/2014  . 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 Hip pain, chronic 04/28/2013  . Essential hypertension 01/10/2013  . Irritable colon 01/10/2013  . Noninfectious gastroenteritis and colitis 01/10/2013  . Encounter for long-term (current) use of other medications 12/04/2012  . Chronic GERD 08/12/2012  . Epigastric pain 08/12/2012  . Diarrhea 08/12/2012  . Primary localized osteoarthrosis, lower leg 05/07/2012  . Difficulty walking 02/06/2012    Sherrilyn Rist, SPT 02/04/19, 8:20 PM  Everlean Alstrom. Graylon Good, PT, DPT 02/04/19, 8:30 PM    [facility] [address], [place], [address] Phone: [phone]   Fax:  [phone]  Name: HISAKO JEFFERIS MRN: [id number] Date of Birth: 1971/01/09

## 2019-02-11 ENCOUNTER — Ambulatory Visit: Payer: Medicare Other | Admitting: Physical Therapy

## 2019-02-11 ENCOUNTER — Other Ambulatory Visit: Payer: Self-pay

## 2019-02-11 ENCOUNTER — Encounter: Payer: Self-pay | Admitting: Physical Therapy

## 2019-02-11 DIAGNOSIS — G8929 Other chronic pain: Secondary | ICD-10-CM

## 2019-02-11 DIAGNOSIS — M25561 Pain in right knee: Secondary | ICD-10-CM | POA: Diagnosis not present

## 2019-02-11 DIAGNOSIS — R262 Difficulty in walking, not elsewhere classified: Secondary | ICD-10-CM

## 2019-02-11 DIAGNOSIS — M6281 Muscle weakness (generalized): Secondary | ICD-10-CM

## 2019-02-11 NOTE — Therapy (Signed)
[name] [address] [place], [address] Phone: [phone]   Fax:  [phone]  Physical Therapy Treatment  Patient Details  Name: Traci Davis MRN: [id number] Date of Birth: 22-Dec-1970 Referring Provider (PT): Gearldine Shown, [place]   Encounter Date: 02/11/2019  PT End of Session - 02/11/19 1800    Visit Number  3    Number of Visits  24    Date for PT Re-Evaluation  04/27/19    Authorization Type  [employer] MEDICARE reporting from 02/02/2019    Authorization Time Period  certification period: 02/02/2019 - 04/27/2019    Authorization - Visit Number  3    Authorization - Number of Visits  10    PT Start Time  [id number]    PT Stop Time  1830    PT Time Calculation (min)  45 min    Equipment Utilized During Treatment  Gait belt    Activity Tolerance  Patient tolerated treatment well;Patient limited by pain    Behavior During Therapy  [facility] for tasks assessed/performed       Past Medical History:  Diagnosis Date  . Anxiety   . Arthritis   . Asthma   . Cerebral palsy ([place])   . Complication of anesthesia    panic attacks before surgery  . COPD (chronic obstructive pulmonary disease) ([place])   . Depression   . Diabetes mellitus without complication ([place])   . GERD (gastroesophageal reflux disease)   . Hypertension     Past Surgical History:  Procedure Laterality Date  . CESAREAN SECTION  1995  . CHOLECYSTECTOMY    . DILATATION & CURETTAGE/HYSTEROSCOPY WITH MYOSURE N/A 02/20/2018   Procedure: DILATATION & CURETTAGE/HYSTEROSCOPY WITH MYOSURE ENDOMETRIAL POLYPECTOMY;  Surgeon: Will Bonnet, MD;  Location: [facility] ORS;  Service: Gynecology;  Laterality: N/A;  . ESOPHAGOGASTRODUODENOSCOPY (EGD) WITH PROPOFOL N/A 01/01/2019   Procedure: ESOPHAGOGASTRODUODENOSCOPY (EGD) WITH PROPOFOL;  Surgeon: Lin Landsman, MD;  Location: [facility] ENDOSCOPY;  Service: Gastroenterology;  Laterality: N/A;  . FOOT CAPSULE  RELEASE W/ PERCUTANEOUS HEEL CORD LENGTHENING, TIBIAL TENDON TRANSFER     age 58 ,and 2010-both legs  . JOINT REPLACEMENT Right    both knees partial replacements dates unknown  . knee replacment     x 5 per pt. last one- left knee 2014. has had 2 on R 3 on L  . TYMPANOPLASTY WITH GRAFT Right 01/08/2018   Procedure: TYMPANOPLASTY WITH POSSIBLE OSSICULAR CHAIN RECONSTRUCTION;  Surgeon: [name], MD;  Location: [facility] ORS;  Service: ENT;  Laterality: Right;    There were no vitals filed for this visit.  Subjective Assessment - 02/11/19 1753    Subjective  Patient reports significant muscle spasms and pain at R LE hamstring and calf post therapy last time but she think it was a good sign because it means she started moving and doing more exercises physically. Patient reports 5/10 pain at posterior R knee today.    Pertinent History  Patient is a 48 y.o. female who presents to outpatient physical therapy with a referral for medical diagnosis of R osteoarthritis knee pain. This patient's chief complaints consist of constant pain R knee that has been gradually increasing in severity since many years ago, leading to the following functional deficits: difficulty with housekeeping activities, prolonged sitting, quality of life, lifting, etc.. Relevant past medical history and comorbidities include anxiety, COPD, cerebral palsy, depression, diabetes mellitus; surgeries include: bilateral partial knee replacement, foot capsule release with  percutaneous heel cord lengthening,    Limitations  Sitting;Lifting;Standing;Walking;House hold activities    How long can you sit comfortably?  45 minutes    How long can you stand comfortably?  3-5 minutes    How long can you walk comfortably?  10-15 minutes    Diagnostic tests  X-rays    Patient Stated Goals  To get up and walking    Currently in Pain?  Yes    Pain Score  5     Pain Location  Knee    Pain Orientation  Right    Pain Onset  More than a month ago        TREATMENT:   Therapeutic exercise:to centralize symptoms and improve ROM, strength, muscular endurance, and activity tolerance required for successful completion of functional activities.  -NuStep level 3 using B lower extremities. Setting 7. For improved extremity mobility, muscular endurance, and activity tolerance; and to induce the analgesic effect of aerobic exercise, stimulate improved joint nutrition, and prepare body structures and systems for following interventions. x 10 minutes.  - R Knee extension 3x10; L knee extension x 10  - Extra resting breaks due to patient low tolerance of activities.  - R knee abduction 2x10 reps with red theraband.  - R knee resisted flexion with red theraband - Patient was educated on diagnosis, anatomy and pathology involved, and prognosis. Pt was educated on and agreed to plan of care.   Manual therapy: to reduce pain and tissue tension, improve range of motion, neuromodulation, in order to promote improved ability to complete functional activities. - STM at anterior and posterior of bilateral knee, passive hamstring stretching bilaterally, pain control and prime for prescribed exercises.   HOME EXERCISE PROGRAM Access Code: [email or web address]     URL: [email or web address]    Date: 02/04/2019  Prepared by: Rosita Kea   Exercises Seated March - 3 sets - 32min hold - 1x daily - 7x weekly Side Stepping with Counter Support - 5 minutes - 1x daily - 7x weekly  Clinical impression:  Patient tolerate the session well with reduced R knee pain at the end of the session. Patient responded well with manual passive hamstring stretching at bilateral lower extremities. Patient also demonstrated good muscle activation pattern with therapy band for hip strengthening exercises. Will continue focus on muscle strengthening and endurance training, and pain management at R knee. The pt will benefit from skilled PT services to address deficits and return to  PLOF and independence, recreational activity and work.    PT Education - 02/11/19 1759    Education provided  Yes    Education Details  Education: Exercise purpose/form. Self management techniques.    Person(s) Educated  Patient    Methods  Explanation;Demonstration;Tactile cues;Verbal cues    Comprehension  Verbalized understanding;Returned demonstration;Verbal cues required;Tactile cues required       PT Short Term Goals - 02/03/19 0859      PT SHORT TERM GOAL #1   Title  Be independent with initial home exercise program for self-management of symptoms    Baseline  initial HEP provided at IE (02/02/2019);    Time  3    Period  Weeks    Status  New    Target Date  02/23/19        PT Long Term Goals - 02/03/19 0901      PT LONG TERM GOAL #1   Title  Be independent with a long-term home exercise program for self-management of symptoms.  Baseline  initial HEP provided at IE (02/02/2019);    Time  12    Period  Weeks    Status  New    Target Date  04/27/19      PT LONG TERM GOAL #2   Title  Demonstrate improved FOTO score by 10 units to demonstrate improvement in overall condition and self-reported functional ability.    Baseline  FOTO = 33(02/02/2019);    Time  12    Period  Weeks    Status  New    Target Date  04/27/19      PT LONG TERM GOAL #3   Title  Reduce pain with functional activities to equal or less than 1/10 to allow patient to complete usual activities including ADLs, IADLs, and social engagement with less difficulty.    Baseline  6/10 and 10/10 with prolonged walking (02/02/2019);    Time  12    Period  Weeks    Status  New    Target Date  04/27/19      PT LONG TERM GOAL #4   Title  Improved 6 minute walk test by 10% indicating improved LEs functional mobility and strength.    Baseline  Deferred to later session (02/02/2019)    Time  12    Period  Weeks    Status  New    Target Date  04/27/19      PT LONG TERM GOAL #5   Title  Patient will  demonstrated 5 time sit to stand with BUEs support within 20s to reduce fall risks and improve LE functional strength.    Baseline  43s with BUEs (from 18.5inch chair) (02/02/2019);    Time  12    Period  Weeks    Status  New    Target Date  04/27/19            Plan - 02/11/19 1958    Clinical Impression Statement  Patient tolerate the session well with reduced R knee pain at the end of the session. Patient responded well with manual passive hamstring stretching at bilateral lower extremities. Patient also demonstrated good muscle activation pattern with therapy band for hip strengthening exercises. Will continue focus on muscle strengthening and endurance training, and pain management at R knee. The pt will benefit from skilled PT services to address deficits and return to PLOF and independence, recreational activity and work.    Personal Factors and Comorbidities  Comorbidity 3+    Comorbidities  anxiety, COPD, cerebral palsy, depression, diabetes mellitus, multiple knee surgeries    Examination-Activity Limitations  Bathing;Continence;Stairs;Stand;Dressing;Bend;Sit;Toileting;Squat;Locomotion Level;Transfers;Lift;Caring for Others    Examination-Participation Restrictions  Driving;Yard Work;Cleaning;Community Activity;Meal Prep;Laundry    Stability/Clinical Decision Making  Evolving/Moderate complexity    Rehab Potential  Fair    PT Frequency  2x / week    PT Duration  12 weeks    PT Treatment/Interventions  ADLs/Self Care Home Management;Cryotherapy;Electrical Stimulation;Iontophoresis 4mg /ml Dexamethasone;Gait training;Stair training;Functional mobility training;Therapeutic activities;Therapeutic exercise;Balance training;Neuromuscular re-education;Patient/family education;Orthotic Fit/Training;Manual techniques;Scar mobilization;Dry needling;Energy conservation;Joint Manipulations;Other (comment)    PT Next Visit Plan  pain control, strengthening, HEP update    PT Home Exercise Plan   Medbridge: [email or web address]    Consulted and Agree with Plan of Care  Patient       Patient will benefit from skilled therapeutic intervention in order to improve the following deficits and impairments:  Impaired UE functional use, Impaired perceived functional ability, Decreased strength, Increased muscle spasms, Pain, Abnormal gait, Decreased activity tolerance, Decreased endurance, Decreased  balance, Decreased coordination, Difficulty walking, Decreased mobility  Visit Diagnosis: Chronic pain of right knee  Muscle weakness (generalized)  Difficulty in walking, not elsewhere classified     Problem List Patient Active Problem List   Diagnosis Date Noted  . MDD (major depressive disorder), recurrent episode, mild ([place]) 12/29/2018  . Insomnia due to mental condition 12/29/2018  . Disorder of vein 03/04/2018  . Lumbar sprain 03/04/2018  . Muscle weakness 03/04/2018  . Neck sprain 03/04/2018  . 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  . Postmenopausal bleeding 02/18/2018  . Impingement syndrome of shoulder region 02/04/2018  . Chest pain with moderate risk for cardiac etiology 05/19/2017  . History of depression 04/15/2017  . GAD (generalized anxiety disorder) 04/15/2017  . Chronic daily headache 04/15/2017  . Panic attack 04/15/2017  . Nocturnal enuresis 04/15/2017  . Mild cognitive impairment 04/15/2017  . Loss of memory 01/14/2017  . Pain medication agreement signed 01/08/2017  . Headache disorder 12/04/2016  . Chronic, continuous use of opioids 07/01/2015  . Vertigo 07/01/2015  . Chronic midline low back pain without sciatica 03/21/2015  . Acute renal failure (ARF) ([place]) 02/19/2015  . Type 2 diabetes mellitus without complication ([place]) [id number]  . Acute pancreatitis 09/04/2014  . Cerebral palsy ([place]) 01/05/2014  . 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 Hip pain, chronic 04/28/2013  . Essential hypertension 01/10/2013  . Irritable colon 01/10/2013  . Noninfectious  gastroenteritis and colitis 01/10/2013  . Encounter for long-term (current) use of other medications 12/04/2012  . Chronic GERD 08/12/2012  . Epigastric pain 08/12/2012  . Diarrhea 08/12/2012  . Primary localized osteoarthrosis, lower leg 05/07/2012  . Difficulty walking 02/06/2012    Sherrilyn Rist, SPT 02/11/19, 8:37 PM  Everlean Alstrom. Graylon Good, PT, DPT 02/11/19, 8:37 PM   [facility] [address] [place], [address] Phone: [phone]   Fax:  [phone]  Name: Traci Davis MRN: [id number] Date of Birth: 11/13/1970

## 2019-02-17 ENCOUNTER — Other Ambulatory Visit: Payer: Self-pay

## 2019-02-17 ENCOUNTER — Encounter: Payer: Self-pay | Admitting: Physical Therapy

## 2019-02-17 ENCOUNTER — Ambulatory Visit: Payer: Medicare Other | Admitting: Physical Therapy

## 2019-02-17 DIAGNOSIS — M6281 Muscle weakness (generalized): Secondary | ICD-10-CM

## 2019-02-17 DIAGNOSIS — M25561 Pain in right knee: Secondary | ICD-10-CM | POA: Diagnosis not present

## 2019-02-17 DIAGNOSIS — M62838 Other muscle spasm: Secondary | ICD-10-CM

## 2019-02-17 DIAGNOSIS — M25511 Pain in right shoulder: Secondary | ICD-10-CM

## 2019-02-17 DIAGNOSIS — G8929 Other chronic pain: Secondary | ICD-10-CM

## 2019-02-17 DIAGNOSIS — R262 Difficulty in walking, not elsewhere classified: Secondary | ICD-10-CM

## 2019-02-17 NOTE — Therapy (Signed)
[facility] [address] [place], [address] Phone: [phone]   Fax:  [phone]  Physical Therapy Treatment  Patient Details  Name: Traci Davis MRN: [id number] Date of Birth: Mar 24, 1971 Referring Provider (PT): Gearldine Shown, [place]   Encounter Date: 02/17/2019  PT End of Session - 02/17/19 1651    Visit Number  4    Number of Visits  24    Date for PT Re-Evaluation  04/27/19    Authorization Type  [employer] MEDICARE reporting from 02/02/2019    Authorization Time Period  certification period: 02/02/2019 - 04/27/2019    Authorization - Visit Number  4    Authorization - Number of Visits  10    PT Start Time  [id number]    PT Stop Time  1430    PT Time Calculation (min)  33 min    Equipment Utilized During Treatment  Gait belt    Activity Tolerance  Patient tolerated treatment well;Patient limited by pain    Behavior During Therapy  [facility] for tasks assessed/performed       Past Medical History:  Diagnosis Date  . Anxiety   . Arthritis   . Asthma   . Cerebral palsy ([place])   . Complication of anesthesia    panic attacks before surgery  . COPD (chronic obstructive pulmonary disease) ([place])   . Depression   . Diabetes mellitus without complication ([place])   . GERD (gastroesophageal reflux disease)   . Hypertension     Past Surgical History:  Procedure Laterality Date  . CESAREAN SECTION  1995  . CHOLECYSTECTOMY    . DILATATION & CURETTAGE/HYSTEROSCOPY WITH MYOSURE N/A 02/20/2018   Procedure: DILATATION & CURETTAGE/HYSTEROSCOPY WITH MYOSURE ENDOMETRIAL POLYPECTOMY;  Surgeon: Will Bonnet, MD;  Location: [facility] ORS;  Service: Gynecology;  Laterality: N/A;  . ESOPHAGOGASTRODUODENOSCOPY (EGD) WITH PROPOFOL N/A 01/01/2019   Procedure: ESOPHAGOGASTRODUODENOSCOPY (EGD) WITH PROPOFOL;  Surgeon: Lin Landsman, MD;  Location: [facility] ENDOSCOPY;  Service: Gastroenterology;  Laterality: N/A;  . FOOT CAPSULE  RELEASE W/ PERCUTANEOUS HEEL CORD LENGTHENING, TIBIAL TENDON TRANSFER     age 59 ,and 2010-both legs  . JOINT REPLACEMENT Right    both knees partial replacements dates unknown  . knee replacment     x 5 per pt. last one- left knee 2014. has had 2 on R 3 on L  . TYMPANOPLASTY WITH GRAFT Right 01/08/2018   Procedure: TYMPANOPLASTY WITH POSSIBLE OSSICULAR CHAIN RECONSTRUCTION;  Surgeon: [name], MD;  Location: [facility] ORS;  Service: ENT;  Laterality: Right;    There were no vitals filed for this visit.  Subjective Assessment - 02/17/19 1402    Subjective  Patient reports only slight discomfort at R posterior knee and rated as 5/10. Patient stated she felt better overall with prescribed exercises but her neck pain has been really bothered her.    Pertinent History  Patient is a 48 y.o. female who presents to outpatient physical therapy with a referral for medical diagnosis of R osteoarthritis knee pain. This patient's chief complaints consist of constant pain R knee that has been gradually increasing in severity since many years ago, leading to the following functional deficits: difficulty with housekeeping activities, prolonged sitting, quality of life, lifting, etc.. Relevant past medical history and comorbidities include anxiety, COPD, cerebral palsy, depression, diabetes mellitus; surgeries include: bilateral partial knee replacement, foot capsule release with percutaneous heel cord lengthening,    Limitations  Sitting;Lifting;Standing;Walking;House hold activities  How long can you sit comfortably?  45 minutes    How long can you stand comfortably?  3-5 minutes    How long can you walk comfortably?  10-15 minutes    Diagnostic tests  X-rays    Patient Stated Goals  To get up and walking    Currently in Pain?  Yes    Pain Score  5     Pain Location  Knee    Pain Orientation  Right    Pain Onset  More than a month ago        TREATMENT:  Therapeutic exercise:to centralize symptoms  and improve ROM, strength, muscular endurance, and activity tolerance required for successful completion of functional activities. -NuStep level 3 using B lower extremities. Setting 7. For improved extremity mobility, muscular endurance, and activity tolerance; and to induce the analgesic effect of aerobic exercise, stimulate improved joint nutrition, and prepare body structures and systems for following interventions. x 8 minutes.  - RKnee extension2x10; L knee extension 2x10  - Extra resting breaks due to patient low tolerance of activities.  - Extra time spend for orthosis donn off and on with assistance  Manual therapy:to reduce pain and tissue tension, improve range of motion, neuromodulation, in order to promote improved ability to complete functional activities. - STM at anterior and posterior of bilateral knee, passive hamstring stretching bilaterally, pain control and prime for prescribed exercises.  - Assisted hamstring stretching in seating bilaterally.    HOME EXERCISE PROGRAM Access Code: [email or web address] URL: [email or web address] Date: 02/04/2019  Prepared by: Rosita Kea   Exercises Seated March - 3 sets - 1min hold - 1x daily - 7x weekly Side Stepping with Counter Support - 5 minutes - 1x daily - 7x weekly  Clinical impression:  Patient tolerated the session well and reduced pain at the end. Patient presented good responses for manual therapy including hamstrings stretching and STM for bilateral knee pain but still had palpable cysts at posterior knee bilaterally which appears to be a major source of the pain. Patient had difficulties getting to her appointment on time which will slow the rehab session progression. Will continue focus on muscle strengthening and endurance training, and pain management at R knee.The pt will benefit from skilled PT services to address deficits and return to PLOF and independence, recreational activity and work.    PT  Education - 02/17/19 1405    Education provided  Yes    Education Details  Exercise purpose/form. Self management techniques.    Person(s) Educated  Patient    Methods  Explanation;Demonstration;Tactile cues;Verbal cues    Comprehension  Verbalized understanding;Returned demonstration;Verbal cues required;Tactile cues required       PT Short Term Goals - 02/03/19 0859      PT SHORT TERM GOAL #1   Title  Be independent with initial home exercise program for self-management of symptoms    Baseline  initial HEP provided at IE (02/02/2019);    Time  3    Period  Weeks    Status  New    Target Date  02/23/19        PT Long Term Goals - 02/03/19 0901      PT LONG TERM GOAL #1   Title  Be independent with a long-term home exercise program for self-management of symptoms.    Baseline  initial HEP provided at IE (02/02/2019);    Time  12    Period  Weeks    Status  New  Target Date  04/27/19      PT LONG TERM GOAL #2   Title  Demonstrate improved FOTO score by 10 units to demonstrate improvement in overall condition and self-reported functional ability.    Baseline  FOTO = 33(02/02/2019);    Time  12    Period  Weeks    Status  New    Target Date  04/27/19      PT LONG TERM GOAL #3   Title  Reduce pain with functional activities to equal or less than 1/10 to allow patient to complete usual activities including ADLs, IADLs, and social engagement with less difficulty.    Baseline  6/10 and 10/10 with prolonged walking (02/02/2019);    Time  12    Period  Weeks    Status  New    Target Date  04/27/19      PT LONG TERM GOAL #4   Title  Improved 6 minute walk test by 10% indicating improved LEs functional mobility and strength.    Baseline  Deferred to later session (02/02/2019)    Time  12    Period  Weeks    Status  New    Target Date  04/27/19      PT LONG TERM GOAL #5   Title  Patient will demonstrated 5 time sit to stand with BUEs support within 20s to reduce fall  risks and improve LE functional strength.    Baseline  43s with BUEs (from 18.5inch chair) (02/02/2019);    Time  12    Period  Weeks    Status  New    Target Date  04/27/19            Plan - 02/17/19 1405    Clinical Impression Statement  Patient tolerated the session well and reduced pain at the end. Patient presented good responses for manual therapy including hamstrings stretching and STM for bilateral knee pain but still had palpable cysts at posterior knee bilaterally which appears to be a major source of the pain. Patient had difficulties getting to her appointment on time which will slow the rehab session progression. Will continue focus on muscle strengthening and endurance training, and pain management at R knee. The pt will benefit from skilled PT services to address deficits and return to PLOF and independence, recreational activity and work.    Personal Factors and Comorbidities  Comorbidity 3+    Comorbidities  anxiety, COPD, cerebral palsy, depression, diabetes mellitus, multiple knee surgeries    Examination-Activity Limitations  Bathing;Continence;Stairs;Stand;Dressing;Bend;Sit;Toileting;Squat;Locomotion Level;Transfers;Lift;Caring for Others    Examination-Participation Restrictions  Driving;Yard Work;Cleaning;Community Activity;Meal Prep;Laundry    Stability/Clinical Decision Making  Evolving/Moderate complexity    Rehab Potential  Fair    PT Frequency  2x / week    PT Duration  12 weeks    PT Treatment/Interventions  ADLs/Self Care Home Management;Cryotherapy;Electrical Stimulation;Iontophoresis 4mg /ml Dexamethasone;Gait training;Stair training;Functional mobility training;Therapeutic activities;Therapeutic exercise;Balance training;Neuromuscular re-education;Patient/family education;Orthotic Fit/Training;Manual techniques;Scar mobilization;Dry needling;Energy conservation;Joint Manipulations;Other (comment)    PT Next Visit Plan  pain control, strengthening, HEP update     PT Home Exercise Plan  Medbridge: [email or web address]    Consulted and Agree with Plan of Care  Patient       Patient will benefit from skilled therapeutic intervention in order to improve the following deficits and impairments:  Impaired UE functional use, Impaired perceived functional ability, Decreased strength, Increased muscle spasms, Pain, Abnormal gait, Decreased activity tolerance, Decreased endurance, Decreased balance, Decreased coordination, Difficulty walking, Decreased mobility  Visit  Diagnosis: Chronic pain of right knee  Muscle weakness (generalized)  Difficulty in walking, not elsewhere classified  Acute pain of right shoulder  Other muscle spasm     Problem List Patient Active Problem List   Diagnosis Date Noted  . MDD (major depressive disorder), recurrent episode, mild ([place]) 12/29/2018  . Insomnia due to mental condition 12/29/2018  . Disorder of vein 03/04/2018  . Lumbar sprain 03/04/2018  . Muscle weakness 03/04/2018  . Neck sprain 03/04/2018  . 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  . Postmenopausal bleeding 02/18/2018  . Impingement syndrome of shoulder region 02/04/2018  . Chest pain with moderate risk for cardiac etiology 05/19/2017  . History of depression 04/15/2017  . GAD (generalized anxiety disorder) 04/15/2017  . Chronic daily headache 04/15/2017  . Panic attack 04/15/2017  . Nocturnal enuresis 04/15/2017  . Mild cognitive impairment 04/15/2017  . Loss of memory 01/14/2017  . Pain medication agreement signed 01/08/2017  . Headache disorder 12/04/2016  . Chronic, continuous use of opioids 07/01/2015  . Vertigo 07/01/2015  . Chronic midline low back pain without sciatica 03/21/2015  . Acute renal failure (ARF) ([place]) 02/19/2015  . Type 2 diabetes mellitus without complication ([place]) [id number]  . Acute pancreatitis 09/04/2014  . Cerebral palsy ([place]) 01/05/2014  . 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 Hip pain, chronic 04/28/2013  .  Essential hypertension 01/10/2013  . Irritable colon 01/10/2013  . Noninfectious gastroenteritis and colitis 01/10/2013  . Encounter for long-term (current) use of other medications 12/04/2012  . Chronic GERD 08/12/2012  . Epigastric pain 08/12/2012  . Diarrhea 08/12/2012  . Primary localized osteoarthrosis, lower leg 05/07/2012  . Difficulty walking 02/06/2012    Sherrilyn Rist, SPT 02/17/19, 5:18 PM  Everlean Alstrom. Graylon Good, PT, DPT 02/17/19, 5:19 PM  [facility] [address] [place], [address] Phone: [phone]   Fax:  [phone]  Name: Traci Davis MRN: [id number] Date of Birth: Jan 21, 1971

## 2019-02-19 ENCOUNTER — Ambulatory Visit: Payer: Medicare Other | Admitting: Physical Therapy

## 2019-02-19 ENCOUNTER — Encounter: Payer: Self-pay | Admitting: Physical Therapy

## 2019-02-19 ENCOUNTER — Other Ambulatory Visit: Payer: Self-pay

## 2019-02-19 DIAGNOSIS — R262 Difficulty in walking, not elsewhere classified: Secondary | ICD-10-CM

## 2019-02-19 DIAGNOSIS — G8929 Other chronic pain: Secondary | ICD-10-CM

## 2019-02-19 DIAGNOSIS — M25561 Pain in right knee: Secondary | ICD-10-CM

## 2019-02-19 DIAGNOSIS — M6281 Muscle weakness (generalized): Secondary | ICD-10-CM

## 2019-02-19 NOTE — Therapy (Signed)
[facility] [address], [place], [address] Phone: [phone]   Fax:  [phone]  Physical Therapy Treatment  Patient Details  Name: Traci Davis MRN: [id number] Date of Birth: Feb 13, 1971 Referring Provider (PT): Gearldine Shown, [place]   Encounter Date: 02/19/2019  PT End of Session - 02/19/19 1420    Visit Number  5    Number of Visits  24    Date for PT Re-Evaluation  04/27/19    Authorization Type  [employer] MEDICARE reporting from 02/02/2019    Authorization Time Period  certification period: 02/02/2019 - 04/27/2019    Authorization - Visit Number  5    Authorization - Number of Visits  10    PT Start Time  [id number]    PT Stop Time  1510    PT Time Calculation (min)  45 min    Equipment Utilized During Treatment  Gait belt    Activity Tolerance  Patient tolerated treatment well;Patient limited by pain    Behavior During Therapy  [facility] for tasks assessed/performed       Past Medical History:  Diagnosis Date  . Anxiety   . Arthritis   . Asthma   . Cerebral palsy ([place])   . Complication of anesthesia    panic attacks before surgery  . COPD (chronic obstructive pulmonary disease) ([place])   . Depression   . Diabetes mellitus without complication ([place])   . GERD (gastroesophageal reflux disease)   . Hypertension     Past Surgical History:  Procedure Laterality Date  . CESAREAN SECTION  1995  . CHOLECYSTECTOMY    . DILATATION & CURETTAGE/HYSTEROSCOPY WITH MYOSURE N/A 02/20/2018   Procedure: DILATATION & CURETTAGE/HYSTEROSCOPY WITH MYOSURE ENDOMETRIAL POLYPECTOMY;  Surgeon: Will Bonnet, MD;  Location: [facility] ORS;  Service: Gynecology;  Laterality: N/A;  . ESOPHAGOGASTRODUODENOSCOPY (EGD) WITH PROPOFOL N/A 01/01/2019   Procedure: ESOPHAGOGASTRODUODENOSCOPY (EGD) WITH PROPOFOL;  Surgeon: Lin Landsman, MD;  Location: [facility] ENDOSCOPY;  Service: Gastroenterology;  Laterality: N/A;  . FOOT CAPSULE  RELEASE W/ PERCUTANEOUS HEEL CORD LENGTHENING, TIBIAL TENDON TRANSFER     age 83 ,and 2010-both legs  . JOINT REPLACEMENT Right    both knees partial replacements dates unknown  . knee replacment     x 5 per pt. last one- left knee 2014. has had 2 on R 3 on L  . TYMPANOPLASTY WITH GRAFT Right 01/08/2018   Procedure: TYMPANOPLASTY WITH POSSIBLE OSSICULAR CHAIN RECONSTRUCTION;  Surgeon: [name], MD;  Location: [facility] ORS;  Service: ENT;  Laterality: Right;    There were no vitals filed for this visit.  Subjective Assessment - 02/19/19 1420    Subjective  Patient reports discomfort at R posterior knee and rated as 6-7/10. Patient states her back and neck as been bother her a lot.    Pertinent History  Patient is a 48 y.o. female who presents to outpatient physical therapy with a referral for medical diagnosis of R osteoarthritis knee pain. This patient's chief complaints consist of constant pain R knee that has been gradually increasing in severity since many years ago, leading to the following functional deficits: difficulty with housekeeping activities, prolonged sitting, quality of life, lifting, etc.. Relevant past medical history and comorbidities include anxiety, COPD, cerebral palsy, depression, diabetes mellitus; surgeries include: bilateral partial knee replacement, foot capsule release with percutaneous heel cord lengthening,    Limitations  Sitting;Lifting;Standing;Walking;House hold activities    How long can you sit comfortably?  45 minutes    How long can you stand comfortably?  3-5 minutes    How long can you walk comfortably?  10-15 minutes    Diagnostic tests  X-rays    Patient Stated Goals  To get up and walking    Currently in Pain?  Yes    Pain Score  7     Pain Location  Knee    Pain Orientation  Right    Pain Onset  More than a month ago       TREATMENT:    Therapeutic exercise: to centralize symptoms and improve ROM, strength, muscular endurance, and activity  tolerance required for successful completion of functional activities.  -NuStep level 3-4 (76minutes each) using B lower extremities. Setting 8. For improved extremity mobility, muscular endurance, and activity tolerance; and to induce the analgesic effect of aerobic exercise, stimulate improved joint nutrition, and prepare body structures and systems for following interventions. x 10 minutes.  All the following exercise performed with red theraband.  - R/L Knee extension 3x10 - R/L knee abduction 3x10  - R/L knee resisted flexion 3x10 - Patient was educated on diagnosis, anatomy and pathology involved, and prognosis. Pt was educated on and agreed to plan of care. - Extra resting breaks due to patient low tolerance of activities.   Manual therapy: to reduce pain and tissue tension, improve range of motion, neuromodulation, in order to promote improved ability to complete functional activities. - STM at anterior and posterior of bilateral knee, passive hamstring stretching bilaterally, pain control and prime for prescribed exercises.  - Manual traction at bilateral LEs x 10s each   HOME EXERCISE PROGRAM Access Code: [email or web address]     URL: [email or web address]    Date: 02/04/2019  Prepared by: Rosita Kea    Exercises Seated March - 3 sets - 68min hold - 1x daily - 7x weekly Side Stepping with Counter Support - 5 minutes - 1x daily - 7x weekly   Clinical impression:  Patient tolerated the session fair and presented significant increased pain at bilateral posterior knees during manual stretching and light palpation but the pain easily coming down to her baseline once palpation stopped. Patient able to perform strengthening exercises with therapy band at seating. Upon palpation, patient had symmetrical soft fatty pad located at posterior knees and very painful to touch. Patient also demonstrated tight hamstrings and painful for palpation bilaterally. Patient referred her knees pain may  due to cysts per her MD visit but this therapist did not find medical documentation from patient record can prove the cause. Patient was very emotional (cry) during the session due to palpation from manual therapy and stated she cannot tolerated such high pain and need to go to hospital before December to find out why it hurts. Patient, however, still commented that physical therapy helped her for strength, flexibility and gait. Will continue focus on muscle strengthening and endurance training, and pain management at R knee. The pt will benefit from skilled PT services to address deficits and return to PLOF and independence, recreational activity and work.    Completed Yellow Flag Risk Form following treatment session.    Yellow Flag Risk Form  1. Please indicate your usual level of pain during the past week. No Pain     Worst pain possible 0    1    2    3    4    5    6    7     8  9    10  2. Does pain, numbness, tingling, or weakness, extend into your leg (from back) &/or arm (from neck)? None of the time    All of the time 0    1    2    3    4    5    6    7    8    9    10   3. How would you rate your general health? Poor     Excellent  (score 10-X) 0    1    2    3    4    5    6    7    8    9    10   4. If you had to spend the rest of your life with your condition as it is right now, how would you feel about it? Delighted     Terrible 0    1    2    3    4    5    6    7    8    9    10   5. How anxious (eg. Tense, uptight, irritable, fearful, difficulty in concentrating / relaxing) have you been feeling during the past week? Not at all     Extremely anxious 0    1    2    3    4    5    6    7    8    9    10   6. How much have you been able to control (ie. Reduce / help) your pain / complaint on your own during the past week? I can reduce it    I can't reduce it at all 0    1    2    3    4    5    6    7    8    9    10   7. Please indicate how depressed (eg. down in dumps,  sad, downhearted, in low spiritis, pessimistic, feelings of hopelessness) have you been feeling in the past week. Not depressed at all   Extremely depressed 0    1    2    3    4    5    6    7    8    9    10   8. One a scale of [id number], how certain are you that you will be doing normal activities or working in six months? Very certain    Not certain at all 0    1    2    3    4    5    6    7    8    9    10   9. I can do light work for an hour. Completely agree   Completely disagree 0    1    2    3    4    5    6    7    8    9    10   10. I can sleep at night. Completely agree   Completely disagree 0    1    2    3    4     5  6    7    8    9    10   11. An increase in pain is an indication that I should stop what I am doing until the pain decreases. Completely agree   Completely disagree 0    1    2    3    4    5    6    7    8    9    10   12. Physical activity makes my pain worse. Completely agree   Completely disagree 0    1    2    3    4    5    6    7    8    9    10   13. I should not do my normal activities including work, with my pain present. Completely agree   Completely disagree 0    1    2    3    4    5    6    7    8    9    10   Total score: 76/130  (score question 3 as 10-X)  Interpretation:  65+ high risk for psychosocial factors (red)  Three main domains related to slower recovery and delay of normal activity:  1. Fear/escape avoidance behavior towards physical activity (#9, 11, 12, 13)  Pt score: 28/40 2. Confidence in general health and condition (#3, 4) and pain control and normal ability (#6, 8)  Pt score: 10/20 3. Emotion and social well being - depression and anxiety (#5, 7)  10/20  YFRF Pattern 1 = Central sensitivity pain mechanism (high questions = 9, 11, 12, 13)  Pt score: 28/40 YFRF Patter 2 = Affective pain mechanism (high questions = 3, 4, 5, 6, 7, 8)  Pt score: 32/60 YFRF Pattern 3 = Peripheral neurogenic pain mechanism (high questions = 1,  2, 6,12)  Pt score: 18/40    PT Education - 02/19/19 1420    Education provided  Yes    Education Details  Exercise purpose/form. Self management techniques.    Person(s) Educated  Patient    Methods  Explanation;Demonstration;Verbal cues;Tactile cues    Comprehension  Verbalized understanding;Returned demonstration;Verbal cues required;Tactile cues required       PT Short Term Goals - 02/03/19 0859      PT SHORT TERM GOAL #1   Title  Be independent with initial home exercise program for self-management of symptoms    Baseline  initial HEP provided at IE (02/02/2019);    Time  3    Period  Weeks    Status  New    Target Date  02/23/19        PT Long Term Goals - 02/03/19 0901      PT LONG TERM GOAL #1   Title  Be independent with a long-term home exercise program for self-management of symptoms.    Baseline  initial HEP provided at IE (02/02/2019);    Time  12    Period  Weeks    Status  New    Target Date  04/27/19      PT LONG TERM GOAL #2   Title  Demonstrate improved FOTO score by 10 units to demonstrate improvement in overall condition and self-reported functional ability.    Baseline  FOTO = 33(02/02/2019);    Time  12    Period  Weeks  Status  New    Target Date  04/27/19      PT LONG TERM GOAL #3   Title  Reduce pain with functional activities to equal or less than 1/10 to allow patient to complete usual activities including ADLs, IADLs, and social engagement with less difficulty.    Baseline  6/10 and 10/10 with prolonged walking (02/02/2019);    Time  12    Period  Weeks    Status  New    Target Date  04/27/19      PT LONG TERM GOAL #4   Title  Improved 6 minute walk test by 10% indicating improved LEs functional mobility and strength.    Baseline  Deferred to later session (02/02/2019)    Time  12    Period  Weeks    Status  New    Target Date  04/27/19      PT LONG TERM GOAL #5   Title  Patient will demonstrated 5 time sit to stand with BUEs  support within 20s to reduce fall risks and improve LE functional strength.    Baseline  43s with BUEs (from 18.5inch chair) (02/02/2019);    Time  12    Period  Weeks    Status  New    Target Date  04/27/19            Plan - 02/19/19 1421    Clinical Impression Statement  Patient tolerated the session fair and presented significant increased pain at bilateral posterior knees during manual stretching and light palpation but the pain easily coming down to her baseline once palpation stopped. Patient able to perform strengthening exercises with therapy band at seating. Upon palpation, patient had symmetrical soft fatty pad located at posterior knees and very painful to touch. Patient also demonstrated tight hamstrings and painful for palpation bilaterally. Patient referred her knees pain may due to cysts per her MD visit but this therapist did not find medical documentation from patient record can prove the cause. Patient was very emotional (cry) during the session due to palpation from manual therapy and stated she cannot tolerated such high pain and need to go to hospital before December to find out why it hurts. Patient, however, still commented that physical therapy helped her for strength, flexibility and gait. Will continue focus on muscle strengthening and endurance training, and pain management at R knee. The pt will benefit from skilled PT services to address deficits and return to PLOF and independence, recreational activity and work.    Personal Factors and Comorbidities  Comorbidity 3+    Comorbidities  anxiety, COPD, cerebral palsy, depression, diabetes mellitus, multiple knee surgeries    Examination-Activity Limitations  Bathing;Continence;Stairs;Stand;Dressing;Bend;Sit;Toileting;Squat;Locomotion Level;Transfers;Lift;Caring for Others    Examination-Participation Restrictions  Driving;Yard Work;Cleaning;Community Activity;Meal Prep;Laundry    Stability/Clinical Decision Making   Evolving/Moderate complexity    Rehab Potential  Fair    PT Frequency  2x / week    PT Duration  12 weeks    PT Treatment/Interventions  ADLs/Self Care Home Management;Cryotherapy;Electrical Stimulation;Iontophoresis 4mg /ml Dexamethasone;Gait training;Stair training;Functional mobility training;Therapeutic activities;Therapeutic exercise;Balance training;Neuromuscular re-education;Patient/family education;Orthotic Fit/Training;Manual techniques;Scar mobilization;Dry needling;Energy conservation;Joint Manipulations;Other (comment)    PT Next Visit Plan  pain control, strengthening, HEP update    PT Home Exercise Plan  Medbridge: [email or web address]    Consulted and Agree with Plan of Care  Patient       Patient will benefit from skilled therapeutic intervention in order to improve the following deficits and impairments:  Impaired UE functional  use, Impaired perceived functional ability, Decreased strength, Increased muscle spasms, Pain, Abnormal gait, Decreased activity tolerance, Decreased endurance, Decreased balance, Decreased coordination, Difficulty walking, Decreased mobility  Visit Diagnosis: Chronic pain of right knee  Difficulty in walking, not elsewhere classified  Muscle weakness (generalized)     Problem List Patient Active Problem List   Diagnosis Date Noted  . MDD (major depressive disorder), recurrent episode, mild ([place]) 12/29/2018  . Insomnia due to mental condition 12/29/2018  . Disorder of vein 03/04/2018  . Lumbar sprain 03/04/2018  . Muscle weakness 03/04/2018  . Neck sprain 03/04/2018  . 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  . Postmenopausal bleeding 02/18/2018  . Impingement syndrome of shoulder region 02/04/2018  . Chest pain with moderate risk for cardiac etiology 05/19/2017  . History of depression 04/15/2017  . GAD (generalized anxiety disorder) 04/15/2017  . Chronic daily headache 04/15/2017  . Panic attack 04/15/2017  . Nocturnal enuresis 04/15/2017  . Mild cognitive  impairment 04/15/2017  . Loss of memory 01/14/2017  . Pain medication agreement signed 01/08/2017  . Headache disorder 12/04/2016  . Chronic, continuous use of opioids 07/01/2015  . Vertigo 07/01/2015  . Chronic midline low back pain without sciatica 03/21/2015  . Acute renal failure (ARF) ([place]) 02/19/2015  . Type 2 diabetes mellitus without complication ([place]) [id number]  . Acute pancreatitis 09/04/2014  . Cerebral palsy ([place]) 01/05/2014  . 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 Hip pain, chronic 04/28/2013  . Essential hypertension 01/10/2013  . Irritable colon 01/10/2013  . Noninfectious gastroenteritis and colitis 01/10/2013  . Encounter for long-term (current) use of other medications 12/04/2012  . Chronic GERD 08/12/2012  . Epigastric pain 08/12/2012  . Diarrhea 08/12/2012  . Primary localized osteoarthrosis, lower leg 05/07/2012  . Difficulty walking 02/06/2012    Sherrilyn Rist, SPT 02/19/19, 4:01 PM  Everlean Alstrom. Graylon Good, PT, DPT 02/19/19, 6:44 PM   [facility] [address] [place], [address] Phone: [phone]   Fax:  [phone]  Name: ERWIN BARBATI MRN: [id number] Date of Birth: [birth year]

## 2019-02-23 ENCOUNTER — Ambulatory Visit: Payer: Medicare Other | Admitting: Physical Therapy

## 2019-02-25 NOTE — Progress Notes (Signed)
Virtual Visit via Telephone Note  I connected with Traci Davis on 02/26/19 at  4:00 PM EDT by telephone and verified that I am speaking with the correct person using two identifiers.   I discussed the limitations, risks, security and privacy concerns of performing an evaluation and management service by telephone and the availability of in person appointments. I also discussed with the patient that there may be a patient responsible charge related to this service. The patient expressed understanding and agreed to proceed. I discussed the assessment and treatment plan with the patient. The patient was provided an opportunity to ask questions and all were answered. The patient agreed with the plan and demonstrated an understanding of the instructions.   The patient was advised to call back or seek an in-person evaluation if the symptoms worsen or if the condition fails to improve as anticipated.  [place] MD OP Progress Note  02/26/2019 5:16 PM Traci Davis  MRN:  [id number]  Chief Complaint:  Chief Complaint    Follow-up     [id number] is a 48 year old Caucasian female, lives in [place], has a history of depression, GAD, insomnia, cerebral palsy, diabetes melitis was evaluated by phone today.  Patient preferred to do a phone call.  Patient today reports she is currently making progress with regards to her mood.  She does not feel depressed or anxious as she used to before.  She reports its likely due to having her granddaughter around.  She reports that is a positive factor in her life at this time.  She reports sleep is fair.  Patient denies any suicidality, homicidality or perceptual disturbances.  She reports her boyfriend continues to be supportive.  Patient denies any other concerns today.   Visit Diagnosis:    ICD-10-CM   1. MDD (major depressive disorder), recurrent episode, mild ([place])  F33.0   2. GAD (generalized anxiety disorder)  F41.1   3. Insomnia due to mental  condition  F51.05 traZODone (DESYREL) 100 MG tablet    Past Psychiatric History: I have reviewed past psychiatric history from my progress note on 09/12/2017.  Past trials of Xanax, Klonopin, Cymbalta, Rozerem-costly.  Past Medical History:  Past Medical History:  Diagnosis Date  . Anxiety   . Arthritis   . Asthma   . Cerebral palsy ([place])   . Complication of anesthesia    panic attacks before surgery  . COPD (chronic obstructive pulmonary disease) ([place])   . Depression   . Diabetes mellitus without complication ([place])   . GERD (gastroesophageal reflux disease)   . Hypertension     Past Surgical History:  Procedure Laterality Date  . CESAREAN SECTION  1995  . CHOLECYSTECTOMY    . DILATATION & CURETTAGE/HYSTEROSCOPY WITH MYOSURE N/A 02/20/2018   Procedure: DILATATION & CURETTAGE/HYSTEROSCOPY WITH MYOSURE ENDOMETRIAL POLYPECTOMY;  Surgeon: Will Bonnet, MD;  Location: [facility] ORS;  Service: Gynecology;  Laterality: N/A;  . ESOPHAGOGASTRODUODENOSCOPY (EGD) WITH PROPOFOL N/A 01/01/2019   Procedure: ESOPHAGOGASTRODUODENOSCOPY (EGD) WITH PROPOFOL;  Surgeon: Lin Landsman, MD;  Location: [facility] ENDOSCOPY;  Service: Gastroenterology;  Laterality: N/A;  . FOOT CAPSULE RELEASE W/ PERCUTANEOUS HEEL CORD LENGTHENING, TIBIAL TENDON TRANSFER     age 34 ,and 2010-both legs  . JOINT REPLACEMENT Right    both knees partial replacements dates unknown  . knee replacment     x 5 per pt. last one- left knee 2014. has had 2 on R 3 on L  . TYMPANOPLASTY WITH GRAFT Right 01/08/2018  Procedure: TYMPANOPLASTY WITH POSSIBLE OSSICULAR CHAIN RECONSTRUCTION;  Surgeon: [name], MD;  Location: [facility] ORS;  Service: ENT;  Laterality: Right;    Family Psychiatric History: I have reviewed family psychiatric history from my progress note on 09/12/2017  Family History:  Family History  Problem Relation Age of Onset  . CAD Father   . Heart attack Brother   . Sexual abuse Paternal Grandfather   . Breast  cancer Neg Hx     Social History: I have reviewed social history from my progress note on 09/12/2017 Social History   Socioeconomic History  . Marital status: Single    Spouse name: Not on file  . Number of children: Not on file  . Years of education: Not on file  . Highest education level: Not on file  Occupational History  . Not on file  Social Needs  . Financial resource strain: Not on file  . Food insecurity    Worry: Not on file    Inability: Not on file  . Transportation needs    Medical: Not on file    Non-medical: Not on file  Tobacco Use  . Smoking status: Never Smoker  . Smokeless tobacco: Never Used  Substance and Sexual Activity  . Alcohol use: No  . Drug use: No  . Sexual activity: Yes    Partners: Male    Birth control/protection: Condom  Lifestyle  . Physical activity    Days per week: Not on file    Minutes per session: Not on file  . Stress: Not on file  Relationships  . Social Herbalist on phone: Not on file    Gets together: Not on file    Attends religious service: Not on file    Active member of club or organization: Not on file    Attends meetings of clubs or organizations: Not on file    Relationship status: Not on file  Other Topics Concern  . Not on file  Social History Narrative  . Not on file    Allergies:  Allergies  Allergen Reactions  . Meloxicam Other (See Comments)    Damage kidney  . Morphine And Related Other (See Comments)    hallucinations  . Vantin [Cefpodoxime] Nausea And Vomiting  . Latex Itching  . Baclofen Other (See Comments)    "makes cerebral palsy do adverse reaction on me", tightens muscles   . Ciprofloxacin Itching and Nausea And Vomiting  . Tape Rash    skin tears.  Paper tape is ok    Metabolic Disorder Labs: Lab Results  Component Value Date   HGBA1C 6.1 (H) 02/19/2015   No results found for: PROLACTIN No results found for: CHOL, TRIG, HDL, CHOLHDL, VLDL, LDLCALC Lab Results   Component Value Date   TSH 2.198 07/10/2016   TSH 2.891 02/19/2015    Therapeutic Level Labs: No results found for: LITHIUM No results found for: VALPROATE No components found for:  CBMZ  Current Medications: Current Outpatient Medications  Medication Sig Dispense Refill  . Chlorzoxazone 750 MG TABS Take by mouth.    . clarithromycin (BIAXIN) 500 MG tablet     . glucose blood test strip USE TO TEST BLOOD SUGAR TWO TIMES A DAY    . oxyCODONE (OXY IR/ROXICODONE) 5 MG immediate release tablet Take by mouth.    Marland Kitchen acetaminophen (TYLENOL) 325 MG tablet Take 2 tablets (650 mg total) by mouth every 6 (six) hours as needed. (Patient not taking: Reported on 12/11/2018)  60 tablet 0  . albuterol (PROVENTIL HFA;VENTOLIN HFA) 108 (90 BASE) MCG/ACT inhaler Inhale 2 puffs into the lungs 4 (four) times daily as needed. For shortness of breath and/or wheezing    . AMITIZA 24 MCG capsule     . atorvastatin (LIPITOR) 10 MG tablet     . Azelastine-Fluticasone 137-50 MCG/ACT SUSP Dymista 137 mcg-50 mcg/spray nasal spray    . Blood Glucose Monitoring Suppl (GLUCOCOM BLOOD GLUCOSE MONITOR) DEVI Test daily before all meals/snacks and once before bedtime.    Marland Kitchen buPROPion (WELLBUTRIN) 100 MG tablet Take 1 tablet (100 mg total) by mouth every morning. 90 tablet 1  . Cholecalciferol (VITAMIN D3) 5000 units CAPS Take 5,000 Units by mouth daily.    Marland Kitchen CIPRODEX OTIC suspension     . clonazePAM (KLONOPIN) 0.5 MG tablet Take 0.5 mg by mouth 2 (two) times daily as needed for anxiety.    Marland Kitchen CREON 36000 units CPEP capsule     . diclofenac sodium (VOLTAREN) 1 % GEL Apply 1 application topically as needed (apply to painful areas of chest wall). 100 g 0  . dicyclomine (BENTYL) 10 MG capsule     . donepezil (ARICEPT) 10 MG tablet Take 10 mg by mouth at bedtime.     Marland Kitchen FLUoxetine (PROZAC) 40 MG capsule Take 1 capsule (40 mg total) by mouth daily. 90 capsule 1  . furosemide (LASIX) 40 MG tablet Take 40 mg by mouth daily as needed  for fluid.     Marland Kitchen glimepiride (AMARYL) 4 MG tablet Take 4 mg by mouth daily with breakfast.    . glucose blood (ONE TOUCH ULTRA TEST) test strip USE TO TEST BLOOD SUGAR TWO TIMES A DAY    . incobotulinumtoxinA (XEOMIN) 100 units SOLR injection Inject 100 Units into the muscle every 3 (three) months.     . Lancets (FREESTYLE) lancets Test daily before all meals/snacks    . lidocaine (LIDODERM) 5 %     . lisinopril (PRINIVIL,ZESTRIL) 20 MG tablet Take 20 mg by mouth daily.    . nitrofurantoin (MACRODANTIN) 100 MG capsule     . nystatin (MYCOSTATIN/NYSTOP) powder Apply 1 g topically daily as needed (rash).     Marland Kitchen omeprazole (PRILOSEC) 40 MG capsule Take 40 mg by mouth daily.     Marland Kitchen oxyCODONE (OXY IR/ROXICODONE) 5 MG immediate release tablet     . [START ON 04/18/2019] oxyCODONE (OXY IR/ROXICODONE) 5 MG immediate release tablet Take by mouth.    . oxyCODONE-acetaminophen (PERCOCET/ROXICET) 5-325 MG tablet oxycodone-acetaminophen 5 mg-325 mg tablet    . pioglitazone (ACTOS) 45 MG tablet Take 45 mg by mouth daily.    . polyethylene glycol (MIRALAX / GLYCOLAX) packet Take 17 gm mixed in liqiud as needed for constipation    . pregabalin (LYRICA) 25 MG capsule Take 50 mg by mouth at bedtime.    . solifenacin (VESICARE) 10 MG tablet Take 10 mg by mouth daily.    . Tiotropium Bromide Monohydrate (SPIRIVA RESPIMAT) 1.25 MCG/ACT AERS Inhale 1 puff into the lungs daily.    . traZODone (DESYREL) 100 MG tablet Take 1 tablet (100 mg total) by mouth at bedtime. 90 tablet 1   No current facility-administered medications for this visit.      Musculoskeletal: Strength & Muscle Tone: UTA Gait & Station: Uses a wheel chair  Patient leans: N/A  Psychiatric Specialty Exam: Review of Systems  Psychiatric/Behavioral: Negative for depression, hallucinations, substance abuse and suicidal ideas. The patient is not nervous/anxious and does not  have insomnia.   All other systems reviewed and are negative.   Last  menstrual period 08/20/2014.There is no height or weight on file to calculate BMI.  General Appearance: UTA  Eye Contact:  UTA  Speech:  Clear and Coherent  Volume:  Normal  Mood:  Euthymic  Affect:  UTA  Thought Process:  Goal Directed and Descriptions of Associations: Intact  Orientation:  Full (Time, Place, and Person)  Thought Content: Logical   Suicidal Thoughts:  No  Homicidal Thoughts:  No  Memory:  Immediate;   Fair Recent;   Fair Remote;   Fair  Judgement:  Fair  Insight:  Fair  Psychomotor Activity:  UTA  Concentration:  Concentration: Fair and Attention Span: Fair  Recall:  [employer] of Knowledge: Fair  Language: Fair  Akathisia:  No  Handed:  Right  AIMS (if indicated): Denies tremors, rigidity  Assets:  Communication Skills Desire for Improvement Social Support  ADL's:  Intact  Cognition: WNL  Sleep:  Fair   Screenings:   Assessment and Plan: Traci Davis is a 48 year old Caucasian female who has a history of depression, chronic pain, diabetes melitis, MCI, cerebral palsy was evaluated by phone today.  Patient is currently doing well on current medication regimen.  Plan as noted below.  Plan Depression-stable Prozac 40 mg p.o. daily Wellbutrin 100 mg p.o. in the morning  GAD-stable Prozac as prescribed Hydroxyzine as needed.  She rarely takes it.  Insomnia-stable Trazodone 100 mg p.o. nightly  Follow-up in clinic in 3 months or sooner if needed.  January 28 at 4 PM  I have spent atleast 15 minutes non face to face with patient today. More than 50 % of the time was spent for psychoeducation and supportive psychotherapy and care coordination. This note was generated in part or whole with voice recognition software. Voice recognition is usually quite accurate but there are transcription errors that can and very often do occur. I apologize for any typographical errors that were not detected and corrected.      Ursula Alert, MD 02/26/2019, 5:16 PM

## 2019-02-26 ENCOUNTER — Ambulatory Visit (INDEPENDENT_AMBULATORY_CARE_PROVIDER_SITE_OTHER): Payer: Medicare Other | Admitting: Psychiatry

## 2019-02-26 ENCOUNTER — Encounter: Payer: Self-pay | Admitting: Psychiatry

## 2019-02-26 ENCOUNTER — Other Ambulatory Visit: Payer: Self-pay

## 2019-02-26 ENCOUNTER — Ambulatory Visit: Payer: Medicare Other | Admitting: Physical Therapy

## 2019-02-26 DIAGNOSIS — F5105 Insomnia due to other mental disorder: Secondary | ICD-10-CM

## 2019-02-26 DIAGNOSIS — F330 Major depressive disorder, recurrent, mild: Secondary | ICD-10-CM

## 2019-02-26 DIAGNOSIS — F411 Generalized anxiety disorder: Secondary | ICD-10-CM

## 2019-02-26 MED ORDER — TRAZODONE HCL 100 MG PO TABS: 100.0000 mg | ORAL_TABLET | Freq: Every day | ORAL | 1 refills | Status: DC

## 2019-03-02 ENCOUNTER — Encounter: Payer: Self-pay | Admitting: Physical Therapy

## 2019-03-02 ENCOUNTER — Other Ambulatory Visit: Payer: Self-pay

## 2019-03-02 ENCOUNTER — Ambulatory Visit: Payer: Medicare Other | Attending: Physician Assistant | Admitting: Physical Therapy

## 2019-03-02 DIAGNOSIS — R262 Difficulty in walking, not elsewhere classified: Secondary | ICD-10-CM | POA: Insufficient documentation

## 2019-03-02 DIAGNOSIS — G8929 Other chronic pain: Secondary | ICD-10-CM

## 2019-03-02 DIAGNOSIS — M25561 Pain in right knee: Secondary | ICD-10-CM | POA: Insufficient documentation

## 2019-03-02 DIAGNOSIS — M6281 Muscle weakness (generalized): Secondary | ICD-10-CM | POA: Insufficient documentation

## 2019-03-02 NOTE — Therapy (Signed)
[facility] [address] [place], [address] Phone: [phone]   Fax:  [phone]  Physical Therapy Treatment  Patient Details  Name: Traci Davis MRN: [id number] Date of Birth: [birth year] Referring Provider (PT): Gearldine Shown, [place]   Encounter Date: 03/02/2019  PT End of Session - 03/02/19 1858    Visit Number  6    Number of Visits  24    Date for PT Re-Evaluation  04/27/19    Authorization Type  [employer] MEDICARE reporting from 02/02/2019    Authorization Time Period  certification period: 02/02/2019 - 04/27/2019    Authorization - Visit Number  6    Authorization - Number of Visits  10    PT Start Time  1750    PT Stop Time  1840    PT Time Calculation (min)  50 min    Activity Tolerance  Patient tolerated treatment well    Behavior During Therapy  [facility] for tasks assessed/performed       Past Medical History:  Diagnosis Date  . Anxiety   . Arthritis   . Asthma   . Cerebral palsy ([place])   . Complication of anesthesia    panic attacks before surgery  . COPD (chronic obstructive pulmonary disease) ([place])   . Depression   . Diabetes mellitus without complication ([place])   . GERD (gastroesophageal reflux disease)   . Hypertension     Past Surgical History:  Procedure Laterality Date  . CESAREAN SECTION  1995  . CHOLECYSTECTOMY    . DILATATION & CURETTAGE/HYSTEROSCOPY WITH MYOSURE N/A 02/20/2018   Procedure: DILATATION & CURETTAGE/HYSTEROSCOPY WITH MYOSURE ENDOMETRIAL POLYPECTOMY;  Surgeon: Will Bonnet, MD;  Location: [facility] ORS;  Service: Gynecology;  Laterality: N/A;  . ESOPHAGOGASTRODUODENOSCOPY (EGD) WITH PROPOFOL N/A 01/01/2019   Procedure: ESOPHAGOGASTRODUODENOSCOPY (EGD) WITH PROPOFOL;  Surgeon: Lin Landsman, MD;  Location: [facility] ENDOSCOPY;  Service: Gastroenterology;  Laterality: N/A;  . FOOT CAPSULE RELEASE W/ PERCUTANEOUS HEEL CORD LENGTHENING, TIBIAL TENDON TRANSFER     age 48 ,and 2010-both legs  . JOINT REPLACEMENT Right    both knees partial replacements dates unknown  . knee replacment     x 5 per pt. last one- left knee 2014. has had 2 on R 3 on L  . TYMPANOPLASTY WITH GRAFT Right 01/08/2018   Procedure: TYMPANOPLASTY WITH POSSIBLE OSSICULAR CHAIN RECONSTRUCTION;  Surgeon: [name], MD;  Location: [facility] ORS;  Service: ENT;  Laterality: Right;    There were no vitals filed for this visit.  Subjective Assessment - 03/02/19 1759    Subjective  Patient reports she is feeling a bit better this week than last week. She reports that she went to the hospital ED two days following her last PT treatment session due to continued intolerable pain in her R posterolateral knee. The ED performed a ultrasound and ruled out DVT and patient reports she was advised she had an inflamed muscle, although this clinician cannot find this diagnosis in ED documentation. Patient missed her appointments last week due to ongoing pain. She is feeling better today, although she continues to bt bothered by pain at the posterior R knee (points to over the distal portion of R biceps femoris and lateral posterior joint line). States she saw her PCP today who has ordered an MRI for the knee since she has had two ultrasounds that were negative for DVT and PCP discussed a referral to the orthopedist since no one has  been able to determine a cause for her pain. Reports the pain at the back of her knee is different than her usual chronic pain and she feels strongly that something is causing it. Upon arrival, reports 5/10 in the low back. States R knee pain is 6-7/10 and knee keeps buckling on her, but no falls. She has been continuing to do her HEP consistently at home. She has also been walking more and her significant other accompany her for safety and companionship. She has been walking about every other day and is unsure how far or long she walks.    Pertinent History  Patient is a 48 y.o. female  who presents to outpatient physical therapy with a referral for medical diagnosis of R osteoarthritis knee pain. This patient's chief complaints consist of constant pain R knee that has been gradually increasing in severity since many years ago, leading to the following functional deficits: difficulty with housekeeping activities, prolonged sitting, quality of life, lifting, etc.. Relevant past medical history and comorbidities include anxiety, COPD, cerebral palsy, depression, diabetes mellitus; surgeries include: bilateral partial knee replacement, foot capsule release with percutaneous heel cord lengthening,    Limitations  Sitting;Lifting;Standing;Walking;House hold activities    How long can you sit comfortably?  45 minutes    How long can you stand comfortably?  3-5 minutes    How long can you walk comfortably?  10-15 minutes    Diagnostic tests  X-rays    Patient Stated Goals  To get up and walking    Currently in Pain?  Yes    Pain Score  7     Pain Location  Knee    Pain Orientation  Right;Lateral;Posterior   worst when standing   Pain Radiating Towards  also has 5/10 pain in low back    Pain Onset  More than a month ago        Patient reports she is feeling a bit better this week than last week. She reports that she went to the hospital ED two days following her last PT treatment session due to continued intolerable pain in her R posterolateral knee. The ED performed a ultrasound and ruled out DVT and patient reports she was advised she had an inflamed muscle, although this clinician cannot find this diagnosis in ED documentation. Patient missed her appointments last week due to ongoing pain. She is feeling better today, although she continues to bt bothered by pain at the posterior R knee (points to over the distal portion of R biceps femoris and lateral posterior joint line). States she saw her PCP today who has ordered an MRI for the knee since she has had two ultrasounds that were  negative for DVT and PCP discussed a referral to the orthopedist since no one has been able to determine a cause for her pain. Reports the pain at the back of her knee is different than her usual chronic pain and she feels strongly that something is causing it. Upon arrival, reports 5/10 in the low back. States R knee pain is 6-7/10 and knee keeps buckling on her, but no falls. She has been continuing to do her HEP consistently at home. She has also been walking more and her significant other accompany her for safety and companionship. She has been walking about every other day and is unsure how far or long she walks.  TREATMENT:  Therapeutic exercise:to centralize symptoms and improve ROM, strength, muscular endurance, and activity tolerance required for successful completion of functional  activities. - extended time for education including discussion of patient's experience so far, diagnostic tests, POC, diagnoses, use of manual therapy, communication, chronic pain, and potential pain generators, role of physical therapy.  -NuStep (large electronic machine) level1 (3.5  Min) level 4(6.5 minutes) using Blower extremities. Seat setting 7 (large machine). For improved extremity mobility, muscular endurance/strength, and activity tolerance; and to induce the analgesic effect of aerobic exercise, stimulate improved joint nutrition, and prepare body structures and systems for following interventions. x8minutes.  - doff/donn R KAFO with assistance from clinician.  - ambulated 40 feet with RW and SBA without L KAFO donned.  - supine SAQ 2x10 each side with 3 second holds. R with 5# ankle weight, L no weight and intermittent AAROM. Required constant cuing on L LE to activate knee extensors instead of hip flexors.  - hooklying manual global extension and flexion of L LE starting from PROM to mildly resisted motion. 2x10 (second set D2 pattern). To improve knee extensor strength for improved ambulation  and less pressure on R knee.  - transfer sit to supine (mod I) and supine to sit (min A for trunk control) on mat table.   Manual therapy: to reduce pain and tissue tension, improve range of motion, neuromodulation, in order to promote improved ability to complete functional activities. - hooklying hamstring passive stretch (SLR position), 3x30-40 seconds each side with time for transitions. Patient unable to get good stretch independently due to difficulty getting in to position for proper stretching.   HOME EXERCISE PROGRAM Access Code: [email or web address] URL: [email or web address] Date: 02/04/2019  Prepared by: Rosita Kea   Exercises Seated March - 3 sets - 37min hold - 1x daily - 7x weekly Side Stepping with Counter Support - 5 minutes - 1x daily - 7x weekly  Clinical impression:  Pt tolerated treatment well and was able to complete all exercises with minimal to no lasting increase in pain or discomfort. Started session with extended period for education and discussion to help guide future care after patient had expressed increased pain and frustration over the phone prior to this visit. Educated patient about importance of good communication, role of physical therapy, limitations of palpation, chronic pain and how it may affect her symptoms, and how physical therapy could help. Patient agreed she would prefer not to have soft tissue mobilization performed at the painful areas at her R posterior lateral knee at this point, given recently it seemed to aggravate her symptoms and not help. Patient and clinician agreed to focus on improved ROM, muscle performance, and functional ability for walking in future visits and to improve communication of pain to better engage in shared decision making for improved teamwork between clinician and patient for optimal outcomes. Patient appeared to respond well to this and some of her anxiety was relieved. Pt required multimodal cuing for proper  technique and to facilitate improved neuromuscular control, strength, range of motion, and functional ability resulting in improved performance and form. Patient continues to have limitations related to weakness, pain, poor endurance, and difficulty with motor control that is limiting her functional mobility. Patient would benefit from continued physical therapy to address remaining impairments and functional limitations to work towards stated goals and return to PLOF or maximal functional independence.     PT Education - 03/02/19 1858    Education provided  Yes    Education Details  Exercise purpose/form. Self management techniques. Education on purpose and role of physical therapy. Improved communication.    Person(s) Educated  Patient  Methods  Explanation;Demonstration;Tactile cues;Verbal cues    Comprehension  Verbalized understanding;Returned demonstration       PT Short Term Goals - 03/02/19 1912      PT SHORT TERM GOAL #1   Title  Be independent with initial home exercise program for self-management of symptoms    Baseline  initial HEP provided at IE (02/02/2019);    Time  3    Period  Weeks    Status  Achieved    Target Date  02/23/19        PT Long Term Goals - 02/03/19 0901      PT LONG TERM GOAL #1   Title  Be independent with a long-term home exercise program for self-management of symptoms.    Baseline  initial HEP provided at IE (02/02/2019);    Time  12    Period  Weeks    Status  New    Target Date  04/27/19      PT LONG TERM GOAL #2   Title  Demonstrate improved FOTO score by 10 units to demonstrate improvement in overall condition and self-reported functional ability.    Baseline  FOTO = 33(02/02/2019);    Time  12    Period  Weeks    Status  New    Target Date  04/27/19      PT LONG TERM GOAL #3   Title  Reduce pain with functional activities to equal or less than 1/10 to allow patient to complete usual activities including ADLs, IADLs, and social  engagement with less difficulty.    Baseline  6/10 and 10/10 with prolonged walking (02/02/2019);    Time  12    Period  Weeks    Status  New    Target Date  04/27/19      PT LONG TERM GOAL #4   Title  Improved 6 minute walk test by 10% indicating improved LEs functional mobility and strength.    Baseline  Deferred to later session (02/02/2019)    Time  12    Period  Weeks    Status  New    Target Date  04/27/19      PT LONG TERM GOAL #5   Title  Patient will demonstrated 5 time sit to stand with BUEs support within 20s to reduce fall risks and improve LE functional strength.    Baseline  43s with BUEs (from 18.5inch chair) (02/02/2019);    Time  12    Period  Weeks    Status  New    Target Date  04/27/19            Plan - 03/02/19 1912    Clinical Impression Statement  Pt tolerated treatment well and was able to complete all exercises with minimal to no lasting increase in pain or discomfort. Started session with extended period for education and discussion to help guide future care after patient had expressed increased pain and frustration over the phone prior to this visit. Educated patient about importance of good communication, role of physical therapy, limitations of palpation, chronic pain and how it may affect her symptoms, and how physical therapy could help. Patient agreed she would prefer not to have soft tissue mobilization performed at the painful areas at her R posterior lateral knee at this point, given recently it seemed to aggravate her symptoms and not help. Patient and clinician agreed to focus on improved ROM, muscle performance, and functional ability for walking in future visits and to  improve communication of pain to better engage in shared decision making for improved teamwork between clinician and patient for optimal outcomes. Patient appeared to respond well to this and some of her anxiety was relieved. Pt required multimodal cuing for proper technique and to  facilitate improved neuromuscular control, strength, range of motion, and functional ability resulting in improved performance and form. Patient continues to have limitations related to weakness, pain, poor endurance, and difficulty with motor control that is limiting her functional mobility. Patient would benefit from continued physical therapy to address remaining impairments and functional limitations to work towards stated goals and return to PLOF or maximal functional independence.    Personal Factors and Comorbidities  Comorbidity 3+    Comorbidities  anxiety, COPD, cerebral palsy, depression, diabetes mellitus, multiple knee surgeries    Examination-Activity Limitations  Bathing;Continence;Stairs;Stand;Dressing;Bend;Sit;Toileting;Squat;Locomotion Level;Transfers;Lift;Caring for Others    Examination-Participation Restrictions  Driving;Yard Work;Cleaning;Community Activity;Meal Prep;Laundry    Stability/Clinical Decision Making  Evolving/Moderate complexity    Rehab Potential  Fair    PT Frequency  2x / week    PT Duration  12 weeks    PT Treatment/Interventions  ADLs/Self Care Home Management;Cryotherapy;Electrical Stimulation;Iontophoresis 4mg /ml Dexamethasone;Gait training;Stair training;Functional mobility training;Therapeutic activities;Therapeutic exercise;Balance training;Neuromuscular re-education;Patient/family education;Orthotic Fit/Training;Manual techniques;Scar mobilization;Dry needling;Energy conservation;Joint Manipulations;Other (comment)    PT Next Visit Plan  pain control, strengthening, HEP update    PT Home Exercise Plan  Medbridge: [email or web address]    Consulted and Agree with Plan of Care  Patient       Patient will benefit from skilled therapeutic intervention in order to improve the following deficits and impairments:  Impaired UE functional use, Impaired perceived functional ability, Decreased strength, Increased muscle spasms, Pain, Abnormal gait, Decreased activity tolerance,  Decreased endurance, Decreased balance, Decreased coordination, Difficulty walking, Decreased mobility  Visit Diagnosis: Chronic pain of right knee  Difficulty in walking, not elsewhere classified  Muscle weakness (generalized)     Problem List Patient Active Problem List   Diagnosis Date Noted  . MDD (major depressive disorder), recurrent episode, mild ([place]) 12/29/2018  . Insomnia due to mental condition 12/29/2018  . Disorder of vein 03/04/2018  . Lumbar sprain 03/04/2018  . Muscle weakness 03/04/2018  . Neck sprain 03/04/2018  . 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  . Postmenopausal bleeding 02/18/2018  . Impingement syndrome of shoulder region 02/04/2018  . Chest pain with moderate risk for cardiac etiology 05/19/2017  . History of depression 04/15/2017  . GAD (generalized anxiety disorder) 04/15/2017  . Chronic daily headache 04/15/2017  . Panic attack 04/15/2017  . Nocturnal enuresis 04/15/2017  . Mild cognitive impairment 04/15/2017  . Loss of memory 01/14/2017  . Pain medication agreement signed 01/08/2017  . Headache disorder 12/04/2016  . Chronic, continuous use of opioids 07/01/2015  . Vertigo 07/01/2015  . Chronic midline low back pain without sciatica 03/21/2015  . Acute renal failure (ARF) ([place]) 02/19/2015  . Type 2 diabetes mellitus without complication ([place]) [id number]  . Acute pancreatitis 09/04/2014  . Cerebral palsy ([place]) 01/05/2014  . 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 Hip pain, chronic 04/28/2013  . Essential hypertension 01/10/2013  . Irritable colon 01/10/2013  . Noninfectious gastroenteritis and colitis 01/10/2013  . Encounter for long-term (current) use of other medications 12/04/2012  . Chronic GERD 08/12/2012  . Epigastric pain 08/12/2012  . Diarrhea 08/12/2012  . Primary localized osteoarthrosis, lower leg 05/07/2012  . Difficulty walking 02/06/2012    Everlean Alstrom. Graylon Good, PT, DPT 03/02/19, 7:18 PM  [facility] [address] [place], [address] Phone: [phone]   Fax:  [phone]  Name: MODUPE HURTADO MRN: [id number] Date of Birth: Aug 19, 1970

## 2019-03-04 ENCOUNTER — Encounter: Payer: Self-pay | Admitting: Physical Therapy

## 2019-03-04 ENCOUNTER — Other Ambulatory Visit: Payer: Self-pay

## 2019-03-04 ENCOUNTER — Ambulatory Visit: Payer: Medicare Other | Admitting: Physical Therapy

## 2019-03-04 DIAGNOSIS — M25561 Pain in right knee: Secondary | ICD-10-CM | POA: Diagnosis not present

## 2019-03-04 DIAGNOSIS — G8929 Other chronic pain: Secondary | ICD-10-CM

## 2019-03-04 DIAGNOSIS — M6281 Muscle weakness (generalized): Secondary | ICD-10-CM

## 2019-03-04 DIAGNOSIS — R262 Difficulty in walking, not elsewhere classified: Secondary | ICD-10-CM

## 2019-03-04 NOTE — Therapy (Signed)
[facility] [address], [place], [address] Phone: [phone]   Fax:  [phone]  Physical Therapy Treatment  Patient Details  Name: Traci Davis MRN: [id number] Date of Birth: 1970/10/27 Referring Provider (PT): Gearldine Shown, [place]   Encounter Date: 03/04/2019  PT End of Session - 03/05/19 1019    Visit Number  7    Number of Visits  24    Date for PT Re-Evaluation  04/27/19    Authorization Type  [employer] MEDICARE reporting from 02/02/2019    Authorization Time Period  certification period: 02/02/2019 - 04/27/2019    Authorization - Visit Number  7    Authorization - Number of Visits  10    PT Start Time  1700    PT Stop Time  1745    PT Time Calculation (min)  45 min    Activity Tolerance  Patient tolerated treatment well    Behavior During Therapy  [facility] for tasks assessed/performed       Past Medical History:  Diagnosis Date  . Anxiety   . Arthritis   . Asthma   . Cerebral palsy ([place])   . Complication of anesthesia    panic attacks before surgery  . COPD (chronic obstructive pulmonary disease) ([place])   . Depression   . Diabetes mellitus without complication ([place])   . GERD (gastroesophageal reflux disease)   . Hypertension     Past Surgical History:  Procedure Laterality Date  . CESAREAN SECTION  1995  . CHOLECYSTECTOMY    . DILATATION & CURETTAGE/HYSTEROSCOPY WITH MYOSURE N/A 02/20/2018   Procedure: DILATATION & CURETTAGE/HYSTEROSCOPY WITH MYOSURE ENDOMETRIAL POLYPECTOMY;  Surgeon: Will Bonnet, MD;  Location: [facility] ORS;  Service: Gynecology;  Laterality: N/A;  . ESOPHAGOGASTRODUODENOSCOPY (EGD) WITH PROPOFOL N/A 01/01/2019   Procedure: ESOPHAGOGASTRODUODENOSCOPY (EGD) WITH PROPOFOL;  Surgeon: Lin Landsman, MD;  Location: [facility] ENDOSCOPY;  Service: Gastroenterology;  Laterality: N/A;  . FOOT CAPSULE RELEASE W/ PERCUTANEOUS HEEL CORD LENGTHENING, TIBIAL TENDON TRANSFER     age 1 ,and 2010-both legs  . JOINT REPLACEMENT Right    both knees partial replacements dates unknown  . knee replacment     x 5 per pt. last one- left knee 2014. has had 2 on R 3 on L  . TYMPANOPLASTY WITH GRAFT Right 01/08/2018   Procedure: TYMPANOPLASTY WITH POSSIBLE OSSICULAR CHAIN RECONSTRUCTION;  Surgeon: [name], MD;  Location: [facility] ORS;  Service: ENT;  Laterality: Right;    There were no vitals filed for this visit.  Subjective Assessment - 03/04/19 1704    Subjective  Patient reports she is feeling okay today. She states that yesterday when she was walking to the bathroom without her KAFO on her L ankle "went out" and it hurt but today it feels better. She has pain on both sides and proximal to R patella upon arrival today rated 5/10. States the back of her knee hurts when she preses on it, but not as much as before. Reports she felt okay following last treatment session until the next day and she was hurting a bit, but she states she usually does the day after PT. States the pain is in different spots. She reports her HEP is going well and she was able to do "a little bit" since last treatment session.    Pertinent History  Patient is a 48 y.o. female who presents to outpatient physical therapy with a referral for medical diagnosis of R  osteoarthritis knee pain. This patient's chief complaints consist of constant pain R knee that has been gradually increasing in severity since many years ago, leading to the following functional deficits: difficulty with housekeeping activities, prolonged sitting, quality of life, lifting, etc.. Relevant past medical history and comorbidities include anxiety, COPD, cerebral palsy, depression, diabetes mellitus; surgeries include: bilateral partial knee replacement, foot capsule release with percutaneous heel cord lengthening,    Limitations  Sitting;Lifting;Standing;Walking;House hold activities    How long can you sit comfortably?  45 minutes    How  long can you stand comfortably?  3-5 minutes    How long can you walk comfortably?  10-15 minutes    Diagnostic tests  X-rays    Patient Stated Goals  To get up and walking    Pain Score  5     Pain Location  Knee    Pain Orientation  Right    Pain Onset  More than a month ago           TREATMENT:  Therapeutic exercise:to centralize symptoms and improve ROM, strength, muscular endurance, and activity tolerance required for successful completion of functional activities. - extended time for education including discussion of patient's experience so far, diagnostic tests, POC, diagnoses, use of manual therapy, communication, chronic pain, and potential pain generators, role of physical therapy.  -NuStep (large electronic machine) level4-1 (5 Min) level 1(5 minutes with use of metronome to improve SPM to 50)using Blower extremities. Seat setting7. For improved extremity mobility, muscular endurance/strength, and activity tolerance; and to induce the analgesic effect of aerobic exercise, stimulate improved joint nutrition, and prepare body structures and systems for following interventions. x4minutes total plus time for transfers on/off machine. Consistent cuing to improve SPM throughout exercise including visual and auditory feedback with metronome.  - doff/donn R KAFO with assistance from clinician.  - ambulated 40 feet with RW and SBA without L KAFO donned.  - seated LAQ 2x10 each side with 1 second holds. R with 5# ankle weight on. Cuing for full range. CGA - min A to keep from falling back at trunk.  - seated heel slides on 4 wheeled rolling stool, 2x 10 each side.  min A to keep from falling back at trunk. - supine SAQ 2x10 each side with 1 second holds. R with 7.5# ankle weight, L no weight. Required constant cuing on L LE to activate knee extensors instead of hip flexors.  - hooklying bridges (therapist holding feet to prevent sliding)  x10 with no band  2x10 with band (red)  around knees and isometric hip abduction - hooklying hip abduction with contralateral isometric hip abduction, 2x10 each side.  - transfer sit to supine (mod I) and supine to sit (min A for trunk control) on mat table.   Multimodal cuing throughout exercises to improve form, coordination, and proper muscle activation.   Manual therapy: to reduce pain and tissue tension, improve range of motion, neuromodulation, in order to promote improved ability to complete functional activities. - hooklying hamstring passive stretch (SLR position), 3x30-40 seconds each side with time for transitions. Patient unable to get good stretch independently due to difficulty getting in to position for proper stretching.  - hooklying manual global extension and flexion of L LE starting from PROM to mildly resisted motion. 2x10 (second set D2 pattern). To improve knee extensor strength for improved ambulation and less pressure on R knee.   HOME EXERCISE PROGRAM Access Code: [email or web address] URL: [email or web address] Date: 02/04/2019  Prepared by: Rosita Kea  Exercises Seated March - 3 sets - 17min hold - 1x daily - 7x weekly Side Stepping with Counter Support - 5 minutes - 1x daily - 7x weekly    PT Education - 03/05/19 1019    Education provided  Yes    Education Details  Exercise purpose/form.    Person(s) Educated  Patient    Methods  Explanation;Demonstration;Tactile cues;Verbal cues    Comprehension  Verbalized understanding;Returned demonstration       PT Short Term Goals - 03/02/19 1912      PT SHORT TERM GOAL #1   Title  Be independent with initial home exercise program for self-management of symptoms    Baseline  initial HEP provided at IE (02/02/2019);    Time  3    Period  Weeks    Status  Achieved    Target Date  02/23/19        PT Long Term Goals - 02/03/19 0901      PT LONG TERM GOAL #1   Title  Be independent with a long-term home exercise program for  self-management of symptoms.    Baseline  initial HEP provided at IE (02/02/2019);    Time  12    Period  Weeks    Status  New    Target Date  04/27/19      PT LONG TERM GOAL #2   Title  Demonstrate improved FOTO score by 10 units to demonstrate improvement in overall condition and self-reported functional ability.    Baseline  FOTO = 33(02/02/2019);    Time  12    Period  Weeks    Status  New    Target Date  04/27/19      PT LONG TERM GOAL #3   Title  Reduce pain with functional activities to equal or less than 1/10 to allow patient to complete usual activities including ADLs, IADLs, and social engagement with less difficulty.    Baseline  6/10 and 10/10 with prolonged walking (02/02/2019);    Time  12    Period  Weeks    Status  New    Target Date  04/27/19      PT LONG TERM GOAL #4   Title  Improved 6 minute walk test by 10% indicating improved LEs functional mobility and strength.    Baseline  Deferred to later session (02/02/2019)    Time  12    Period  Weeks    Status  New    Target Date  04/27/19      PT LONG TERM GOAL #5   Title  Patient will demonstrated 5 time sit to stand with BUEs support within 20s to reduce fall risks and improve LE functional strength.    Baseline  43s with BUEs (from 18.5inch chair) (02/02/2019);    Time  12    Period  Weeks    Status  New    Target Date  04/27/19            Plan - 03/05/19 1023    Clinical Impression Statement  Patient tolerated treatment well with no complaint of increased pain by end of session. She demonstrated improvements in motor control, quad, glute, abdominal, and lateral hip activation with exercises and required intermittent to constant cuing for full effort and proper form. Patient required constant encouragement on the nu-step to improve her SPM for improved muscular endurance required for walking longer distances and for generalized health and pain control. Patient continues to be limited by  pain,  tightness, joint stiffness, weakness, and decreased activity tolerance that is affecting her ability to complete basic mobility, donning shoes and socks, and quality of life. Future visits will continue to work on strength, activity tolerance, ROM, and improved self-efficacy for self care. Patient would benefit from continued physical therapy to address remaining impairments and functional limitations to work towards stated goals and return to PLOF or maximal functional independence    Personal Factors and Comorbidities  Comorbidity 3+    Comorbidities  anxiety, COPD, cerebral palsy, depression, diabetes mellitus, multiple knee surgeries    Examination-Activity Limitations  Bathing;Continence;Stairs;Stand;Dressing;Bend;Sit;Toileting;Squat;Locomotion Level;Transfers;Lift;Caring for Others    Examination-Participation Restrictions  Driving;Yard Work;Cleaning;Community Activity;Meal Prep;Laundry    Stability/Clinical Decision Making  Evolving/Moderate complexity    Rehab Potential  Fair    PT Frequency  2x / week    PT Duration  12 weeks    PT Treatment/Interventions  ADLs/Self Care Home Management;Cryotherapy;Electrical Stimulation;Iontophoresis 4mg /ml Dexamethasone;Gait training;Stair training;Functional mobility training;Therapeutic activities;Therapeutic exercise;Balance training;Neuromuscular re-education;Patient/family education;Orthotic Fit/Training;Manual techniques;Scar mobilization;Dry needling;Energy conservation;Joint Manipulations;Other (comment)    PT Next Visit Plan  pain control, strengthening, HEP update    PT Home Exercise Plan  Medbridge: [email or web address]    Consulted and Agree with Plan of Care  Patient       Patient will benefit from skilled therapeutic intervention in order to improve the following deficits and impairments:  Impaired UE functional use, Impaired perceived functional ability, Decreased strength, Increased muscle spasms, Pain, Abnormal gait, Decreased activity tolerance,  Decreased endurance, Decreased balance, Decreased coordination, Difficulty walking, Decreased mobility  Visit Diagnosis: Chronic pain of right knee  Muscle weakness (generalized)  Difficulty in walking, not elsewhere classified     Problem List Patient Active Problem List   Diagnosis Date Noted  . MDD (major depressive disorder), recurrent episode, mild ([place]) 12/29/2018  . Insomnia due to mental condition 12/29/2018  . Disorder of vein 03/04/2018  . Lumbar sprain 03/04/2018  . Muscle weakness 03/04/2018  . Neck sprain 03/04/2018  . 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  . Postmenopausal bleeding 02/18/2018  . Impingement syndrome of shoulder region 02/04/2018  . Chest pain with moderate risk for cardiac etiology 05/19/2017  . History of depression 04/15/2017  . GAD (generalized anxiety disorder) 04/15/2017  . Chronic daily headache 04/15/2017  . Panic attack 04/15/2017  . Nocturnal enuresis 04/15/2017  . Mild cognitive impairment 04/15/2017  . Loss of memory 01/14/2017  . Pain medication agreement signed 01/08/2017  . Headache disorder 12/04/2016  . Chronic, continuous use of opioids 07/01/2015  . Vertigo 07/01/2015  . Chronic midline low back pain without sciatica 03/21/2015  . Acute renal failure (ARF) ([place]) 02/19/2015  . Type 2 diabetes mellitus without complication ([place]) [id number]  . Acute pancreatitis 09/04/2014  . Cerebral palsy ([place]) 01/05/2014  . 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 Hip pain, chronic 04/28/2013  . Essential hypertension 01/10/2013  . Irritable colon 01/10/2013  . Noninfectious gastroenteritis and colitis 01/10/2013  . Encounter for long-term (current) use of other medications 12/04/2012  . Chronic GERD 08/12/2012  . Epigastric pain 08/12/2012  . Diarrhea 08/12/2012  . Primary localized osteoarthrosis, lower leg 05/07/2012  . Difficulty walking 02/06/2012    Everlean Alstrom. Graylon Good, PT, DPT 03/05/19, 10:25 AM  [facility] [address], [place], [address] Phone: [phone]   Fax:  [phone]  Name: Traci Davis MRN: [id number] Date of Birth: 1970-10-26

## 2019-03-09 ENCOUNTER — Ambulatory Visit: Payer: Medicare Other | Admitting: Physical Therapy

## 2019-03-11 ENCOUNTER — Ambulatory Visit: Payer: Medicare Other | Admitting: Physical Therapy

## 2019-03-17 ENCOUNTER — Encounter: Payer: Self-pay | Admitting: Physical Therapy

## 2019-03-17 ENCOUNTER — Ambulatory Visit: Payer: Medicare Other | Admitting: Physical Therapy

## 2019-03-17 ENCOUNTER — Other Ambulatory Visit: Payer: Self-pay

## 2019-03-17 DIAGNOSIS — G8929 Other chronic pain: Secondary | ICD-10-CM

## 2019-03-17 DIAGNOSIS — M25561 Pain in right knee: Secondary | ICD-10-CM | POA: Diagnosis not present

## 2019-03-17 DIAGNOSIS — R262 Difficulty in walking, not elsewhere classified: Secondary | ICD-10-CM

## 2019-03-17 DIAGNOSIS — M6281 Muscle weakness (generalized): Secondary | ICD-10-CM

## 2019-03-17 NOTE — Therapy (Signed)
[facility] [address], [place], [address] Phone: [phone]   Fax:  [phone]  Physical Therapy Treatment  Patient Details  Name: Traci Davis MRN: [id number] Date of Birth: 08/15/1970 Referring Provider (PT): Gearldine Shown, [place]   Encounter Date: 03/17/2019  PT End of Session - 03/17/19 1744    Visit Number  8    Number of Visits  24    Date for PT Re-Evaluation  04/27/19    Authorization Type  [employer] MEDICARE reporting from 02/02/2019    Authorization Time Period  certification period: 02/02/2019 - 04/27/2019    Authorization - Visit Number  8    Authorization - Number of Visits  10    PT Start Time  8502    PT Stop Time  1810    PT Time Calculation (min)  40 min    Activity Tolerance  Patient tolerated treatment well    Behavior During Therapy  [facility] for tasks assessed/performed       Past Medical History:  Diagnosis Date  . Anxiety   . Arthritis   . Asthma   . Cerebral palsy ([place])   . Complication of anesthesia    panic attacks before surgery  . COPD (chronic obstructive pulmonary disease) ([place])   . Depression   . Diabetes mellitus without complication ([place])   . GERD (gastroesophageal reflux disease)   . Hypertension     Past Surgical History:  Procedure Laterality Date  . CESAREAN SECTION  1995  . CHOLECYSTECTOMY    . DILATATION & CURETTAGE/HYSTEROSCOPY WITH MYOSURE N/A 02/20/2018   Procedure: DILATATION & CURETTAGE/HYSTEROSCOPY WITH MYOSURE ENDOMETRIAL POLYPECTOMY;  Surgeon: Will Bonnet, MD;  Location: [facility] ORS;  Service: Gynecology;  Laterality: N/A;  . ESOPHAGOGASTRODUODENOSCOPY (EGD) WITH PROPOFOL N/A 01/01/2019   Procedure: ESOPHAGOGASTRODUODENOSCOPY (EGD) WITH PROPOFOL;  Surgeon: Lin Landsman, MD;  Location: [facility] ENDOSCOPY;  Service: Gastroenterology;  Laterality: N/A;  . FOOT CAPSULE RELEASE W/ PERCUTANEOUS HEEL CORD LENGTHENING, TIBIAL TENDON TRANSFER     age 48 ,and 2010-both legs  . JOINT REPLACEMENT Right    both knees partial replacements dates unknown  . knee replacment     x 5 per pt. last one- left knee 2014. has had 2 on R 3 on L  . TYMPANOPLASTY WITH GRAFT Right 01/08/2018   Procedure: TYMPANOPLASTY WITH POSSIBLE OSSICULAR CHAIN RECONSTRUCTION;  Surgeon: [name], MD;  Location: [facility] ORS;  Service: ENT;  Laterality: Right;    There were no vitals filed for this visit.  OBJECTIVE 6MWT: 392 feet with KAFO and RW, no seated breaks.  Subjective Assessment - 03/17/19 1741    Subjective  Patient reports she was sick last week after she took a flu shot. She did not do anything last week because she was hurting so bad and feeling weak. She is feeling better today. She rates her pain 5/10 in both knees today. She states she got a radiograph for her neck that shows bone spurs and attributes her neck pain to that. States her physician reccomended PT for her neck but it is a different doctor than her current prescriber and patient correctly believes that the neck would need to be prescried by the same person as the legs in oder to address both by PT and have insurance cover it. She prefers to continue working on LEs at this point.    Pertinent History  Patient is a 48 y.o. female who presents to outpatient  physical therapy with a referral for medical diagnosis of R osteoarthritis knee pain. This patient's chief complaints consist of constant pain R knee that has been gradually increasing in severity since many years ago, leading to the following functional deficits: difficulty with housekeeping activities, prolonged sitting, quality of life, lifting, etc.. Relevant past medical history and comorbidities include anxiety, COPD, cerebral palsy, depression, diabetes mellitus; surgeries include: bilateral partial knee replacement, foot capsule release with percutaneous heel cord lengthening,    Limitations  Sitting;Lifting;Standing;Walking;House hold  activities    How long can you sit comfortably?  45 minutes    How long can you stand comfortably?  3-5 minutes    How long can you walk comfortably?  10-15 minutes    Diagnostic tests  X-rays    Patient Stated Goals  To get up and walking    Currently in Pain?  Yes    Pain Score  5     Pain Onset  More than a month ago      TREATMENT:  Therapeutic exercise:to centralize symptoms and improve ROM, strength, muscular endurance, and activity tolerance required for successful completion of functional activities. -NuStep(large electronic machine) level 1(65mnutes with use of metronome to improve SPM to 50)using Blower extremities.Seat setting7. For improved extremity mobility, muscular endurance/strength, and activity tolerance; and to induce the analgesic effect of aerobic exercise, stimulate improved joint nutrition, and prepare body structures and systems for following interventions. x122mutes total plus time for transfers on/off machine. Consistent cuing to improve SPM throughout exercise including visual and auditory feedback with metronome. No improvement even with persistent cuing.  Average SPM: 34.  - 6MWT with RW and KAFO with SBA for safety. To improve endurance and gait pattern. 3 standing breaks.  - seated marching 3x1 min without back support to improve trunk stability and LE strength and endurance. Cuing for improved form.  - sidestepping at treadmill bar x 2 min back and forth. Cuing for improved form  Multimodal cuing throughout exercises to improve form, coordination, and proper muscle activation.     HOME EXERCISE PROGRAM Access Code: [email or web address] URL: [email or web address] Date: 02/04/2019  Prepared by: SaRosita Kea Exercises Seated March - 3 sets - 46m72mhold - 1x daily - 7x weekly Side Stepping with Counter Support - 5 minutes - 1x daily - 7x weekly    Patient response to treatment:  Pt tolerated treatment well with some difficulty  due to fatigue. Focused on patient's goals of improving walking tolerance and functional endurance while also re-initiating HEP after patient has not been doing it while sick.  Pt was able to complete all exercises with minimal to no lasting increase in pain or discomfort. Pt required multimodal cuing for proper technique and to facilitate improved neuromuscular control, strength, range of motion, and functional ability resulting in improved performance and form. Patient would benefit from continued physical therapy to address remaining impairments and functional limitations to work towards stated goals and return to PLOF or maximal functional independence.      PT Education - 03/17/19 1744    Education provided  Yes    Education Details  Exercise purpose/form.    Person(s) Educated  Patient    Methods  Explanation    Comprehension  Verbalized understanding;Returned demonstration;Verbal cues required;Tactile cues required       PT Short Term Goals - 03/02/19 1912      PT SHORT TERM GOAL #1   Title  Be independent with initial home exercise program for self-management of symptoms  Baseline  initial HEP provided at IE (02/02/2019);    Time  3    Period  Weeks    Status  Achieved    Target Date  02/23/19        PT Long Term Goals - 03/17/19 1754      PT LONG TERM GOAL #1   Title  Be independent with a long-term home exercise program for self-management of symptoms.    Baseline  initial HEP provided at IE (02/02/2019);    Time  12    Period  Weeks    Status  On-going    Target Date  04/27/19      PT LONG TERM GOAL #2   Title  Demonstrate improved FOTO score by 10 units to demonstrate improvement in overall condition and self-reported functional ability.    Baseline  FOTO = 33(02/02/2019);    Time  12    Period  Weeks    Status  On-going    Target Date  04/27/19      PT LONG TERM GOAL #3   Title  Reduce pain with functional activities to equal or less than 1/10 to allow  patient to complete usual activities including ADLs, IADLs, and social engagement with less difficulty.    Baseline  6/10 and 10/10 with prolonged walking (02/02/2019); 7-8/10 (03/17/2019);    Time  12    Period  Weeks    Status  Partially Met    Target Date  04/27/19      PT LONG TERM GOAL #4   Title  Improved 6 minute walk test by 10% indicating improved LEs functional mobility and strength.    Baseline  Deferred to later session (02/02/2019); 330 ft (02/04/2019); 392 ft (03/17/2019);    Time  12    Period  Weeks    Status  Partially Met    Target Date  04/27/19      PT LONG TERM GOAL #5   Title  Patient will demonstrated 5 time sit to stand with BUEs support within 20s to reduce fall risks and improve LE functional strength.    Baseline  43s with BUEs (from 18.5inch chair) (02/02/2019);    Time  12    Period  Weeks    Status  New    Target Date  03/28/19            Plan - 03/17/19 1810    Clinical Impression Statement  Pt tolerated treatment well with some difficulty due to fatigue. Focused on patient's goals of improving walking tolerance and functional endurance while also re-initiating HEP after patient has not been doing it while sick.  Pt was able to complete all exercises with minimal to no lasting increase in pain or discomfort. Pt required multimodal cuing for proper technique and to facilitate improved neuromuscular control, strength, range of motion, and functional ability resulting in improved performance and form. Patient would benefit from continued physical therapy to address remaining impairments and functional limitations to work towards stated goals and return to PLOF or maximal functional independence.    Personal Factors and Comorbidities  Comorbidity 3+    Comorbidities  anxiety, COPD, cerebral palsy, depression, diabetes mellitus, multiple knee surgeries    Examination-Activity Limitations   Bathing;Continence;Stairs;Stand;Dressing;Bend;Sit;Toileting;Squat;Locomotion Level;Transfers;Lift;Caring for Others    Examination-Participation Restrictions  Driving;Yard Work;Cleaning;Community Activity;Meal Prep;Laundry    Stability/Clinical Decision Making  Evolving/Moderate complexity    Rehab Potential  Fair    PT Frequency  2x / week    PT  Duration  12 weeks    PT Treatment/Interventions  ADLs/Self Care Home Management;Cryotherapy;Electrical Stimulation;Iontophoresis 60m/ml Dexamethasone;Gait training;Stair training;Functional mobility training;Therapeutic activities;Therapeutic exercise;Balance training;Neuromuscular re-education;Patient/family education;Orthotic Fit/Training;Manual techniques;Scar mobilization;Dry needling;Energy conservation;Joint Manipulations;Other (comment)    PT Next Visit Plan  pain control, strengthening, stretching    PT Home Exercise Plan  Medbridge: [email or web address]    Consulted and Agree with Plan of Care  Patient       Patient will benefit from skilled therapeutic intervention in order to improve the following deficits and impairments:  Impaired UE functional use, Impaired perceived functional ability, Decreased strength, Increased muscle spasms, Pain, Abnormal gait, Decreased activity tolerance, Decreased endurance, Decreased balance, Decreased coordination, Difficulty walking, Decreased mobility  Visit Diagnosis: Chronic pain of right knee  Muscle weakness (generalized)  Difficulty in walking, not elsewhere classified     Problem List Patient Active Problem List   Diagnosis Date Noted  . MDD (major depressive disorder), recurrent episode, mild ([place] 12/29/2018  . Insomnia due to mental condition 12/29/2018  . Disorder of vein 03/04/2018  . Lumbar sprain 03/04/2018  . Muscle weakness 03/04/2018  . Neck sprain 03/04/2018  . 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  . Postmenopausal bleeding 02/18/2018  . Impingement syndrome of shoulder region 02/04/2018  .  Chest pain with moderate risk for cardiac etiology 05/19/2017  . History of depression 04/15/2017  . GAD (generalized anxiety disorder) 04/15/2017  . Chronic daily headache 04/15/2017  . Panic attack 04/15/2017  . Nocturnal enuresis 04/15/2017  . Mild cognitive impairment 04/15/2017  . Loss of memory 01/14/2017  . Pain medication agreement signed 01/08/2017  . Headache disorder 12/04/2016  . Chronic, continuous use of opioids 07/01/2015  . Vertigo 07/01/2015  . Chronic midline low back pain without sciatica 03/21/2015  . Acute renal failure (ARF) ([place] 02/19/2015  . Type 2 diabetes mellitus without complication ([place] 019/62/2297 . Acute pancreatitis 09/04/2014  . Cerebral palsy ([place] 01/05/2014  . 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 Hip pain, chronic 04/28/2013  . Essential hypertension 01/10/2013  . Irritable colon 01/10/2013  . Noninfectious gastroenteritis and colitis 01/10/2013  . Encounter for long-term (current) use of other medications 12/04/2012  . Chronic GERD 08/12/2012  . Epigastric pain 08/12/2012  . Diarrhea 08/12/2012  . Primary localized osteoarthrosis, lower leg 05/07/2012  . Difficulty walking 02/06/2012    SEverlean Alstrom SGraylon Good PT, DPT 03/17/19, 6:11 PM  [facility] [address] [place] [address]Phone: [phone]  Fax:  [phone] Name: SGLADIS SOLEYMRN: [id number] of Birth: 107/24/1972

## 2019-03-19 ENCOUNTER — Encounter: Payer: Self-pay | Admitting: Gastroenterology

## 2019-03-19 ENCOUNTER — Encounter (INDEPENDENT_AMBULATORY_CARE_PROVIDER_SITE_OTHER): Payer: Self-pay

## 2019-03-19 ENCOUNTER — Other Ambulatory Visit: Payer: Self-pay

## 2019-03-19 ENCOUNTER — Ambulatory Visit (INDEPENDENT_AMBULATORY_CARE_PROVIDER_SITE_OTHER): Payer: Medicare Other | Admitting: Gastroenterology

## 2019-03-19 VITALS — BP 146/84 | HR 62 | Temp 98.4°F | Resp 16 | Ht 62.0 in | Wt 210.0 lb

## 2019-03-19 DIAGNOSIS — K219 Gastro-esophageal reflux disease without esophagitis: Secondary | ICD-10-CM

## 2019-03-19 NOTE — Progress Notes (Signed)
Cephas Darby, MD [address]  [place]  [place], [place] [address]  Main: [phone]  Fax: [phone]    Gastroenterology Consultation  Referring Provider:     Remi Haggard, FNP Primary Care Physician:  Remi Haggard, FNP Primary Gastroenterologist:  Dr. Cephas Darby Reason for Consultation:     Dysphagia        HPI:   Traci Davis is a 48 y.o. female referred by Dr. Lavena Bullion, Jordan Likes, FNP  for consultation & management of dysphagia.  Patient has history of generalized anxiety disorder, depression, mild cognitive impairment, diabetes who is morbidly obese.  For the last 2 weeks, patient has been suffering from bilateral leg pain, predominantly on the right side.  Whenever she tries to eat solid food she feels it is getting stuck in her throat, has to drink liquids.  She has been taking omeprazole 40 mg daily since age of 23 for reflux symptoms.  Predominantly heartburn and regurgitation.  She does drink sodas, sweetened tea regularly  Patient underwent CT soft tissue of the neck on 8/10 which was unremarkable  Follow-up visit 11 9020 Patient underwent EGD which was unremarkable except for reflux esophagitis.  She continues to take omeprazole 40 mg twice daily and feels significantly better.  She denies any reflux or dysphagia symptoms after increasing it to 2 times a day.  She lost about 10 pounds since last visit intentionally.  She thinks her diabetes is under control.  NSAIDs: None  Antiplts/Anticoagulants/Anti thrombotics: None  GI Procedures: She reports having had an endoscopy several years ago She denies family history of GI malignancy  EGD 01/01/2019 - Normal duodenal bulb and second portion of the duodenum. - Normal stomach. - Normal gastroesophageal junction and esophagus. Biopsied. - Esophagogastric landmarks identified.  DIAGNOSIS:  A. ESOPHAGUS, RANDOM; COLD BIOPSY:  - FRAGMENTS OF SQUAMOUS MUCOSA WITH FOCAL CHANGES SUGGESTIVE OF  REFLUX.  - INTRAEPITHELIAL EOSINOPHILS ARE NOT IDENTIFIED.  - NEGATIVE FOR DYSPLASIA AND MALIGNANCY.   Past Medical History:  Diagnosis Date  . Anxiety   . Arthritis   . Asthma   . Cerebral palsy ([place])   . Complication of anesthesia    panic attacks before surgery  . COPD (chronic obstructive pulmonary disease) ([place])   . Depression   . Diabetes mellitus without complication ([place])   . GERD (gastroesophageal reflux disease)   . Hypertension     Past Surgical History:  Procedure Laterality Date  . CESAREAN SECTION  1995  . CHOLECYSTECTOMY    . DILATATION & CURETTAGE/HYSTEROSCOPY WITH MYOSURE N/A 02/20/2018   Procedure: DILATATION & CURETTAGE/HYSTEROSCOPY WITH MYOSURE ENDOMETRIAL POLYPECTOMY;  Surgeon: Will Bonnet, MD;  Location: [facility] ORS;  Service: Gynecology;  Laterality: N/A;  . ESOPHAGOGASTRODUODENOSCOPY (EGD) WITH PROPOFOL N/A 01/01/2019   Procedure: ESOPHAGOGASTRODUODENOSCOPY (EGD) WITH PROPOFOL;  Surgeon: Lin Landsman, MD;  Location: [facility] ENDOSCOPY;  Service: Gastroenterology;  Laterality: N/A;  . FOOT CAPSULE RELEASE W/ PERCUTANEOUS HEEL CORD LENGTHENING, TIBIAL TENDON TRANSFER     age 73 ,and 2010-both legs  . JOINT REPLACEMENT Right    both knees partial replacements dates unknown  . knee replacment     x 5 per pt. last one- left knee 2014. has had 2 on R 3 on L  . TYMPANOPLASTY WITH GRAFT Right 01/08/2018   Procedure: TYMPANOPLASTY WITH POSSIBLE OSSICULAR CHAIN RECONSTRUCTION;  Surgeon: [name], MD;  Location: [facility] ORS;  Service: ENT;  Laterality: Right;    Current Outpatient Medications:  .  albuterol (PROVENTIL HFA;VENTOLIN HFA) 108 (90 BASE) MCG/ACT inhaler, Inhale 2 puffs into the lungs 4 (four) times daily as needed. For shortness of breath and/or wheezing, Disp: , Rfl:  .  AMITIZA 24 MCG capsule, , Disp: , Rfl:  .  atorvastatin (LIPITOR) 10 MG tablet, , Disp: , Rfl:  .  Blood Glucose Monitoring Suppl (GLUCOCOM BLOOD GLUCOSE MONITOR) DEVI, Test  daily before all meals/snacks and once before bedtime., Disp: , Rfl:  .  buPROPion (WELLBUTRIN) 100 MG tablet, Take 1 tablet (100 mg total) by mouth every morning., Disp: 90 tablet, Rfl: 1 .  Chlorzoxazone 750 MG TABS, Take by mouth., Disp: , Rfl:  .  Cholecalciferol (VITAMIN D3) 5000 units CAPS, Take 5,000 Units by mouth daily., Disp: , Rfl:  .  CREON 36000 units CPEP capsule, , Disp: , Rfl:  .  diclofenac sodium (VOLTAREN) 1 % GEL, Apply 1 application topically as needed (apply to painful areas of chest wall)., Disp: 100 g, Rfl: 0 .  dicyclomine (BENTYL) 10 MG capsule, , Disp: , Rfl:  .  donepezil (ARICEPT) 10 MG tablet, Take 10 mg by mouth at bedtime. , Disp: , Rfl:  .  FLUoxetine (PROZAC) 40 MG capsule, Take 1 capsule (40 mg total) by mouth daily., Disp: 90 capsule, Rfl: 1 .  furosemide (LASIX) 40 MG tablet, Take 40 mg by mouth daily as needed for fluid. , Disp: , Rfl:  .  glimepiride (AMARYL) 4 MG tablet, Take 4 mg by mouth daily with breakfast., Disp: , Rfl:  .  glucose blood (ONE TOUCH ULTRA TEST) test strip, USE TO TEST BLOOD SUGAR TWO TIMES A DAY, Disp: , Rfl:  .  glucose blood test strip, USE TO TEST BLOOD SUGAR TWO TIMES A DAY, Disp: , Rfl:  .  incobotulinumtoxinA (XEOMIN) 100 units SOLR injection, Inject 100 Units into the muscle every 3 (three) months. , Disp: , Rfl:  .  Lancets (FREESTYLE) lancets, Test daily before all meals/snacks, Disp: , Rfl:  .  lidocaine (LIDODERM) 5 %, , Disp: , Rfl:  .  lisinopril (PRINIVIL,ZESTRIL) 20 MG tablet, Take 20 mg by mouth daily., Disp: , Rfl:  .  nystatin (MYCOSTATIN/NYSTOP) powder, Apply 1 g topically daily as needed (rash). , Disp: , Rfl:  .  omeprazole (PRILOSEC) 40 MG capsule, Take 40 mg by mouth daily. , Disp: , Rfl:  .  oxyCODONE (OXY IR/ROXICODONE) 5 MG immediate release tablet, , Disp: , Rfl:  .  pioglitazone (ACTOS) 45 MG tablet, Take 45 mg by mouth daily., Disp: , Rfl:  .  solifenacin (VESICARE) 10 MG tablet, Take 10 mg by mouth daily.,  Disp: , Rfl:  .  Tiotropium Bromide Monohydrate (SPIRIVA RESPIMAT) 1.25 MCG/ACT AERS, Inhale 1 puff into the lungs daily., Disp: , Rfl:  .  traZODone (DESYREL) 100 MG tablet, Take 1 tablet (100 mg total) by mouth at bedtime., Disp: 90 tablet, Rfl: 1 .  acetaminophen (TYLENOL) 325 MG tablet, Take 2 tablets (650 mg total) by mouth every 6 (six) hours as needed. (Patient not taking: Reported on 12/11/2018), Disp: 60 tablet, Rfl: 0 .  Azelastine-Fluticasone 137-50 MCG/ACT SUSP, Dymista 137 mcg-50 mcg/spray nasal spray, Disp: , Rfl:  .  CIPRODEX OTIC suspension, , Disp: , Rfl:  .  clarithromycin (BIAXIN) 500 MG tablet, , Disp: , Rfl:  .  clonazePAM (KLONOPIN) 0.5 MG tablet, Take 0.5 mg by mouth 2 (two) times daily as needed for anxiety., Disp: , Rfl:  .  nitrofurantoin (MACRODANTIN) 100 MG capsule, ,  Disp: , Rfl:  .  oxyCODONE (OXY IR/ROXICODONE) 5 MG immediate release tablet, Take by mouth., Disp: , Rfl:  .  [START ON 04/18/2019] oxyCODONE (OXY IR/ROXICODONE) 5 MG immediate release tablet, Take by mouth., Disp: , Rfl:  .  oxyCODONE-acetaminophen (PERCOCET/ROXICET) 5-325 MG tablet, oxycodone-acetaminophen 5 mg-325 mg tablet, Disp: , Rfl:  .  polyethylene glycol (MIRALAX / GLYCOLAX) packet, Take 17 gm mixed in liqiud as needed for constipation, Disp: , Rfl:  .  pregabalin (LYRICA) 25 MG capsule, Take 50 mg by mouth at bedtime., Disp: , Rfl:    Family History  Problem Relation Age of Onset  . CAD Father   . Heart attack Brother   . Sexual abuse Paternal Grandfather   . Breast cancer Neg Hx      Social History   Tobacco Use  . Smoking status: Never Smoker  . Smokeless tobacco: Never Used  Substance Use Topics  . Alcohol use: No  . Drug use: No    Allergies as of 03/19/2019 - Review Complete 03/19/2019  Allergen Reaction Noted  . Meloxicam Other (See Comments) 02/19/2015  . Morphine and related Other (See Comments) 02/19/2015  . Vantin [cefpodoxime] Nausea And Vomiting 02/19/2015  .  Latex Itching 01/08/2018  . Baclofen Other (See Comments) 02/19/2015  . Ciprofloxacin Itching and Nausea And Vomiting 02/19/2015  . Tape Rash 02/19/2015    Review of Systems:    All systems reviewed and negative except where noted in HPI.   Physical Exam:  BP (!) 146/84 (BP Location: Left Arm, Patient Position: Sitting, Cuff Size: Large)   Pulse 62   Temp 98.4 F (36.9 C)   Resp 16   Ht 5\' 2"  (1.575 m)   Wt 210 lb (95.3 kg)   LMP 08/20/2014 Comment: missed cup when trying to obtain urine spec  BMI 38.41 kg/m  Patient's last menstrual period was 08/20/2014.  General:   Alert,  Well-developed, well-nourished, pleasant and cooperative in NAD Head:  Normocephalic and atraumatic. Eyes:  Sclera clear, no icterus.   Conjunctiva pink. Ears:  Normal auditory acuity. Nose:  No deformity, discharge, or lesions. Mouth:  No deformity or lesions,oropharynx pink & moist. Neck:  Supple; no masses or thyromegaly. Lungs:  Respirations even and unlabored.  Clear throughout to auscultation.   No wheezes, crackles, or rhonchi. No acute distress. Heart:  Regular rate and rhythm; no murmurs, clicks, rubs, or gallops. Abdomen:  Normal bowel sounds. Soft, obese, non-tender and non-distended without masses, hepatosplenomegaly or hernias noted.  No guarding or rebound tenderness.   Rectal: Not performed Msk:  Symmetrical without gross deformities. Pulses:  Normal pulses noted. Extremities:  No clubbing or edema.  No cyanosis. Neurologic:  Alert and oriented x3;  grossly normal neurologically. Skin:  Intact without significant lesions or rashes. No jaundice. Psych:  Alert and cooperative. Normal mood and affect.  Imaging Studies: Reviewed  Assessment and Plan:   Traci Davis is a 48 y.o. female with metabolic syndrome, chronic GERD maintained on PPI seen in consultation for right-sided neck pain, worse with swallowing and possible dysphagia.  EGD was unremarkable with no evidence of stricture or  Barrett's esophagus.  Encouraged her to lose extra 10 to 20 pounds Continue antireflux lifestyle Decrease omeprazole to 40 mg daily before meals Tight control of diabetes No further work-up from GI standpoint at this time   Follow up as needed   Cephas Darby, MD

## 2019-03-25 ENCOUNTER — Ambulatory Visit: Payer: Medicare Other | Admitting: Physical Therapy

## 2019-04-01 ENCOUNTER — Other Ambulatory Visit: Payer: Self-pay

## 2019-04-01 ENCOUNTER — Ambulatory Visit: Payer: Medicare Other | Attending: Physician Assistant | Admitting: Physical Therapy

## 2019-04-01 DIAGNOSIS — M25561 Pain in right knee: Secondary | ICD-10-CM | POA: Diagnosis not present

## 2019-04-01 DIAGNOSIS — R262 Difficulty in walking, not elsewhere classified: Secondary | ICD-10-CM

## 2019-04-01 DIAGNOSIS — G8929 Other chronic pain: Secondary | ICD-10-CM | POA: Diagnosis present

## 2019-04-01 DIAGNOSIS — M6281 Muscle weakness (generalized): Secondary | ICD-10-CM | POA: Diagnosis present

## 2019-04-01 NOTE — Therapy (Signed)
[facility] [address], [place], [address] Phone: [phone]   Fax:  [phone]  Physical Therapy Treatment  Patient Details  Name: Traci Davis MRN: [id number] Date of Birth: 1970/12/30 Referring Provider (PT): Traci Davis, [place]   Encounter Date: 04/01/2019  PT End of Session - 04/01/19 1628    Visit Number  9    Number of Visits  24    Date for PT Re-Evaluation  04/27/19    Authorization Type  [employer] MEDICARE reporting from 02/02/2019    Authorization Time Period  certification period: 02/02/2019 - 04/27/2019    Authorization - Visit Number  8    Authorization - Number of Visits  10    PT Start Time  1520    PT Stop Time  1600    PT Time Calculation (min)  40 min    Activity Tolerance  Patient tolerated treatment well    Behavior During Therapy  [facility] for tasks assessed/performed       Past Medical History:  Diagnosis Date  . Anxiety   . Arthritis   . Asthma   . Cerebral palsy ([place])   . Complication of anesthesia    panic attacks before surgery  . COPD (chronic obstructive pulmonary disease) ([place])   . Depression   . Diabetes mellitus without complication ([place])   . GERD (gastroesophageal reflux disease)   . Hypertension     Past Surgical History:  Procedure Laterality Date  . CESAREAN SECTION  1995  . CHOLECYSTECTOMY    . DILATATION & CURETTAGE/HYSTEROSCOPY WITH MYOSURE N/A 02/20/2018   Procedure: DILATATION & CURETTAGE/HYSTEROSCOPY WITH MYOSURE ENDOMETRIAL POLYPECTOMY;  Surgeon: Will Bonnet, MD;  Location: [facility] ORS;  Service: Gynecology;  Laterality: N/A;  . ESOPHAGOGASTRODUODENOSCOPY (EGD) WITH PROPOFOL N/A 01/01/2019   Procedure: ESOPHAGOGASTRODUODENOSCOPY (EGD) WITH PROPOFOL;  Surgeon: Lin Landsman, MD;  Location: [facility] ENDOSCOPY;  Service: Gastroenterology;  Laterality: N/A;  . FOOT CAPSULE RELEASE W/ PERCUTANEOUS HEEL CORD LENGTHENING, TIBIAL TENDON TRANSFER      age 49 ,and 2010-both legs  . JOINT REPLACEMENT Right    both knees partial replacements dates unknown  . knee replacment     x 5 per pt. last one- left knee 2014. has had 2 on R 3 on L  . TYMPANOPLASTY WITH GRAFT Right 01/08/2018   Procedure: TYMPANOPLASTY WITH POSSIBLE OSSICULAR CHAIN RECONSTRUCTION;  Surgeon: [name], MD;  Location: [facility] ORS;  Service: ENT;  Laterality: Right;    There were no vitals filed for this visit.  Subjective Assessment - 04/01/19 1527    Subjective  Patient reports she has been having a lot of pain recently due to the colder weather that makes all of her muscles and joints hurt. She states the heat in her home works okay but her boyfreind likes to keep it no higher than 68 degrees to save money unless she is having a very bad day.  She has been using a heating blanket to improve her comfort.  States anemia causes her to be a lot colder. States the soreness has "came out of behind her knee caps" and "is not as sore anymore." She reports her pain today is 6/10 in her knees and in the back of her neck. The knee pain she has now is different than that behind her knee caps. Denies falls since last session. States she has days "where I can't move just hardly at all because it makes my pain  worse."    Pertinent History  Patient is a 48 y.o. female who presents to outpatient physical therapy with a referral for medical diagnosis of R osteoarthritis knee pain. This patient's chief complaints consist of constant pain R knee that has been gradually increasing in severity since many years ago, leading to the following functional deficits: difficulty with housekeeping activities, prolonged sitting, quality of life, lifting, etc.. Relevant past medical history and comorbidities include anxiety, COPD, cerebral palsy, depression, diabetes mellitus; surgeries include: bilateral partial knee replacement, foot capsule release with percutaneous heel cord lengthening,    Limitations   Sitting;Lifting;Standing;Walking;House hold activities    How long can you sit comfortably?  45 minutes    How long can you stand comfortably?  3-5 minutes    How long can you walk comfortably?  10-15 minutes    Diagnostic tests  X-rays    Patient Stated Goals  To get up and walking    Currently in Pain?  Yes    Pain Score  6     Pain Location  Knee   and back of neck   Pain Orientation  Right;Left;Anterior    Pain Onset  More than a month ago       TREATMENT:  Therapeutic exercise:to centralize symptoms and improve ROM, strength, muscular endurance, and activity tolerance required for successful completion of functional activities.  Assistance to remove KAFO from L LE.   -NuStep(large electronic machine) level1(67mnuteswith instructions to  improve SPM to 40)using Blower extremities.Seat setting7.For improved extremity mobility, muscular endurance/strength, and activity tolerance; and to induce the analgesic effect of aerobic exercise, stimulate improved joint nutrition, and prepare body structures and systems for following interventions. [id number] plus time for transfers on/off machine. Consistent cuing to improve SPM throughout exercise including visual and auditory feedback with metronome.No improvement even with persistent cuing.  Average SPM: 33.   Assistance to don KAFO to L LE.   - 6MWT with RW and KAFO with SBA for safety. To improve endurance and gait pattern. Able to go 300 feet. Cuing for techniques for improved L swing through.   - standing hip flexion, abduction, and extension with BUE support (except flexion just U UE support). x10 each side each direction. Standing at treadmill bar with CGA assist and multimodal cuing for improved form and safety.   - Seated without back support rows with scapular retraction for improved postural and shoulder girdle strengthening and mobility. Required instruction for technique and cuing to retract, posteriorly  tilt, and depress scapulae. x30 with red theraband.   - sit to stand from elevated seat (large nustep), x 10 with Min A blocking L knee and supporting from gait belt to help with forward shift and lift off. Patient very apprehensive to attempt without KAFO but was able to complete safely with assistance as described above. Holding pink physioball to prevent use of BUE for support.   Multimodal cuingthroughout exercisesto improve form, coordination, and proper muscle activation.     HOME EXERCISE PROGRAM Access Code: [email or web address] URL: [email or web address] Date: 02/04/2019  Prepared by: SaRosita Kea Exercises Seated March - 3 sets - 70m370mhold - 1x daily - 7x weekly Side Stepping with Counter Support - 5 minutes - 1x daily - 7x weekly   PT Education - 04/01/19 1628    Education provided  Yes    Education Details  Exercise purpose/form. Self management techniques. HEP    Person(s) Educated  Patient    Methods  Explanation;Tactile cues;Verbal cues  Comprehension  Verbalized understanding;Returned demonstration;Verbal cues required;Tactile cues required;Need further instruction       PT Short Term Goals - 03/02/19 1912      PT SHORT TERM GOAL #1   Title  Be independent with initial home exercise program for self-management of symptoms    Baseline  initial HEP provided at IE (02/02/2019);    Time  3    Period  Weeks    Status  Achieved    Target Date  02/23/19        PT Long Term Goals - 03/17/19 1754      PT LONG TERM GOAL #1   Title  Be independent with a long-term home exercise program for self-management of symptoms.    Baseline  initial HEP provided at IE (02/02/2019);    Time  12    Period  Weeks    Status  On-going    Target Date  04/27/19      PT LONG TERM GOAL #2   Title  Demonstrate improved FOTO score by 10 units to demonstrate improvement in overall condition and self-reported functional ability.    Baseline  FOTO = 33(02/02/2019);     Time  12    Period  Weeks    Status  On-going    Target Date  04/27/19      PT LONG TERM GOAL #3   Title  Reduce pain with functional activities to equal or less than 1/10 to allow patient to complete usual activities including ADLs, IADLs, and social engagement with less difficulty.    Baseline  6/10 and 10/10 with prolonged walking (02/02/2019); 7-8/10 (03/17/2019);    Time  12    Period  Weeks    Status  Partially Met    Target Date  04/27/19      PT LONG TERM GOAL #4   Title  Improved 6 minute walk test by 10% indicating improved LEs functional mobility and strength.    Baseline  Deferred to later session (02/02/2019); 330 ft (02/04/2019); 392 ft (03/17/2019);    Time  12    Period  Weeks    Status  Partially Met    Target Date  04/27/19      PT LONG TERM GOAL #5   Title  Patient will demonstrated 5 time sit to stand with BUEs support within 20s to reduce fall risks and improve LE functional strength.    Baseline  43s with BUEs (from 18.5inch chair) (02/02/2019);    Time  12    Period  Weeks    Status  New    Target Date  03/28/19            Plan - 04/01/19 1627    Clinical Impression Statement  Pt tolerated treatment well with some difficulty due to fatigue but reported no lasting increase in pain. Focused on improving functional activity tolerance, ambulation distance and time, hip strengthening and transfers to help patient complete functional mobility at home with less difficulty. Discussed walking HEP x 5 laps down hallway daily. Pt required multimodal cuing and CGA - min A for proper technique/safety and to facilitate improved neuromuscular control, strength, range of motion, and functional ability resulting in improved performance and form. Patient would benefit from continued physical therapy to address remaining impairments and functional limitations to work towards stated goals and return to PLOF or maximal functional independence    Personal Factors and  Comorbidities  Comorbidity 3+    Comorbidities  anxiety, COPD, cerebral  palsy, depression, diabetes mellitus, multiple knee surgeries    Examination-Activity Limitations  Bathing;Continence;Stairs;Stand;Dressing;Bend;Sit;Toileting;Squat;Locomotion Level;Transfers;Lift;Caring for Others    Examination-Participation Restrictions  Driving;Yard Work;Cleaning;Community Activity;Meal Prep;Laundry    Stability/Clinical Decision Making  Evolving/Moderate complexity    Rehab Potential  Fair    PT Frequency  2x / week    PT Duration  12 weeks    PT Treatment/Interventions  ADLs/Self Care Home Management;Cryotherapy;Electrical Stimulation;Iontophoresis 69m/ml Dexamethasone;Gait training;Stair training;Functional mobility training;Therapeutic activities;Therapeutic exercise;Balance training;Neuromuscular re-education;Patient/family education;Orthotic Fit/Training;Manual techniques;Scar mobilization;Dry needling;Energy conservation;Joint Manipulations;Other (comment)    PT Next Visit Plan  pain control, strengthening, stretching    PT Home Exercise Plan  Medbridge: [email or web address]    Consulted and Agree with Plan of Care  Patient       Patient will benefit from skilled therapeutic intervention in order to improve the following deficits and impairments:  Impaired UE functional use, Impaired perceived functional ability, Decreased strength, Increased muscle spasms, Pain, Abnormal gait, Decreased activity tolerance, Decreased endurance, Decreased balance, Decreased coordination, Difficulty walking, Decreased mobility  Visit Diagnosis: Chronic pain of right knee  Muscle weakness (generalized)  Difficulty in walking, not elsewhere classified     Problem List Patient Active Problem List   Diagnosis Date Noted  . MDD (major depressive disorder), recurrent episode, mild ([place] 12/29/2018  . Insomnia due to mental condition 12/29/2018  . Disorder of vein 03/04/2018  . Lumbar sprain 03/04/2018  . Muscle weakness  03/04/2018  . Neck sprain 03/04/2018  . 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  . Postmenopausal bleeding 02/18/2018  . Impingement syndrome of shoulder region 02/04/2018  . Chest pain with moderate risk for cardiac etiology 05/19/2017  . History of depression 04/15/2017  . GAD (generalized anxiety disorder) 04/15/2017  . Chronic daily headache 04/15/2017  . Panic attack 04/15/2017  . Nocturnal enuresis 04/15/2017  . Mild cognitive impairment 04/15/2017  . Loss of memory 01/14/2017  . Pain medication agreement signed 01/08/2017  . Headache disorder 12/04/2016  . Chronic, continuous use of opioids 07/01/2015  . Vertigo 07/01/2015  . Chronic midline low back pain without sciatica 03/21/2015  . Acute renal failure (ARF) ([place] 02/19/2015  . Type 2 diabetes mellitus without complication ([place] 031/59/4585 . Acute pancreatitis 09/04/2014  . Cerebral palsy ([place] 01/05/2014  . 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 Hip pain, chronic 04/28/2013  . Essential hypertension 01/10/2013  . Irritable colon 01/10/2013  . Noninfectious gastroenteritis and colitis 01/10/2013  . Encounter for long-term (current) use of other medications 12/04/2012  . Chronic GERD 08/12/2012  . Epigastric pain 08/12/2012  . Diarrhea 08/12/2012  . Primary localized osteoarthrosis, lower leg 05/07/2012  . Difficulty walking 02/06/2012    SEverlean Alstrom SGraylon Good PT, DPT 04/01/19, 4:32 PM   [facility] [address] [place] [address]Phone: [phone]  Fax:  [phone] Name: Traci MCINROYMRN: [id number] of Birth: 104/16/1972

## 2019-04-08 ENCOUNTER — Other Ambulatory Visit: Payer: Self-pay

## 2019-04-08 ENCOUNTER — Ambulatory Visit: Payer: Medicare Other | Admitting: Physical Therapy

## 2019-04-08 DIAGNOSIS — M25561 Pain in right knee: Secondary | ICD-10-CM

## 2019-04-08 DIAGNOSIS — M6281 Muscle weakness (generalized): Secondary | ICD-10-CM

## 2019-04-08 DIAGNOSIS — G8929 Other chronic pain: Secondary | ICD-10-CM

## 2019-04-08 DIAGNOSIS — R262 Difficulty in walking, not elsewhere classified: Secondary | ICD-10-CM

## 2019-04-08 NOTE — Therapy (Addendum)
[facility] [address] [place], [address] Phone: [phone]   Fax:  [phone]  Physical Therapy Treatment / Progress Note Reporting period: 02/02/2019 - 04/08/2019  Patient Details  Name: Traci Davis MRN: [id number] Date of Birth: [birth year] Referring Provider (PT): Gearldine Shown, [place]   Encounter Date: 04/08/2019  PT End of Session - 04/08/19 1520    Visit Number  10    Number of Visits  34    Date for PT Re-Evaluation  04/27/19    Authorization Type  [employer] MEDICARE reporting from 02/02/2019    Authorization Time Period  certification period: 02/02/2019 - 04/27/2019    Authorization - Visit Number  10    Authorization - Number of Visits  10    PT Start Time  9211    PT Stop Time  1605    PT Time Calculation (min)  49 min    Activity Tolerance  Patient tolerated treatment well;Patient limited by pain    Behavior During Therapy  [facility] for tasks assessed/performed       Past Medical History:  Diagnosis Date  . Anxiety   . Arthritis   . Asthma   . Cerebral palsy ([place])   . Complication of anesthesia    panic attacks before surgery  . COPD (chronic obstructive pulmonary disease) ([place])   . Depression   . Diabetes mellitus without complication ([place])   . GERD (gastroesophageal reflux disease)   . Hypertension     Past Surgical History:  Procedure Laterality Date  . CESAREAN SECTION  1995  . CHOLECYSTECTOMY    . DILATATION & CURETTAGE/HYSTEROSCOPY WITH MYOSURE N/A 02/20/2018   Procedure: DILATATION & CURETTAGE/HYSTEROSCOPY WITH MYOSURE ENDOMETRIAL POLYPECTOMY;  Surgeon: Will Bonnet, MD;  Location: [facility] ORS;  Service: Gynecology;  Laterality: N/A;  . ESOPHAGOGASTRODUODENOSCOPY (EGD) WITH PROPOFOL N/A 01/01/2019   Procedure: ESOPHAGOGASTRODUODENOSCOPY (EGD) WITH PROPOFOL;  Surgeon: Lin Landsman, MD;  Location: [facility] ENDOSCOPY;  Service: Gastroenterology;  Laterality: N/A;  . FOOT  CAPSULE RELEASE W/ PERCUTANEOUS HEEL CORD LENGTHENING, TIBIAL TENDON TRANSFER     age 5 ,and 2010-both legs  . JOINT REPLACEMENT Right    both knees partial replacements dates unknown  . knee replacment     x 5 per pt. last one- left knee 2014. has had 2 on R 3 on L  . TYMPANOPLASTY WITH GRAFT Right 01/08/2018   Procedure: TYMPANOPLASTY WITH POSSIBLE OSSICULAR CHAIN RECONSTRUCTION;  Surgeon: [name], MD;  Location: [facility] ORS;  Service: ENT;  Laterality: Right;    There were no vitals filed for this visit.  Subjective Assessment - 04/08/19 1519    Subjective  Patient report her neck is really bothering her today rates it 7-8/10 and her legs hurt as well today 6/10 "behind my knee caps" pointing to posterior knee. Patient reports her HEP is going well. She has been standing and walking around the house and trying to do leg exercises from last week. States her knee and neck pain gets up to 8/10 lately. Walked around [employer] 45 mins the other day and found it was very uncomfortable when she got back to the car and her legs were giving out. Patient reports that walking and standing up longer has gotten somewhat easier since starting physical therapy. She is also able to stay up longer without holding the walker (holdling the cabinet).    Pertinent History  Patient is a 48 y.o. female who presents to  outpatient physical therapy with a referral for medical diagnosis of R osteoarthritis knee pain. This patient's chief complaints consist of constant pain R knee that has been gradually increasing in severity since many years ago, leading to the following functional deficits: difficulty with housekeeping activities, prolonged sitting, quality of life, lifting, etc.. Relevant past medical history and comorbidities include anxiety, COPD, cerebral palsy, depression, diabetes mellitus; surgeries include: bilateral partial knee replacement, foot capsule release with percutaneous heel cord lengthening,     Limitations  Sitting;Lifting;Standing;Walking;House hold activities    How long can you sit comfortably?  45 minutes    How long can you stand comfortably?  5 minutes    How long can you walk comfortably?  25 minutes    Diagnostic tests  X-rays    Patient Stated Goals  To get up and walking    Currently in Pain?  Yes    Pain Score  8     Pain Onset  More than a month ago    Effect of Pain on Daily Activities  limits daily tasks personal care, household activities, lifting, using walker for ambulation, difficulties walking to garage         [facility] PT Assessment - 04/08/19 0001      Assessment   Medical Diagnosis   osteoarthritis of R knee.     Referring Provider (PT)  covington, Tessie Eke, PA    Onset Date/Surgical Date  --   Most recent flare up of R knee pain around 06/2018   Hand Dominance  Right    Prior Therapy  R shoulder pain in 2018-2019      Precautions   Precautions  None      Restrictions   Weight Bearing Restrictions  No      Home Environment   Living Environment  Private residence    Living Arrangements  Spouse/significant other   boyfriend   Available Help at Discharge  Home health   Monday to Friday 10-1pm     Prior Function   Level of Independence  Needs assistance with ADLs;Needs assistance with homemaking    Dressing  Moderate    Meal Prep  Moderate    Laundry  Maximal    Vacuuming  Maximal      Cognition   Overall Cognitive Status  Within Functional Limits for tasks assessed   Anxious      Observation/Other Assessments   Observations  see note from 04/08/2019 for latest objective data    Focus on Therapeutic Outcomes (FOTO)   FOTO = 52      OBJECTIVE   Posture Difficulty standing upright, requires rolling walker as baseline. Sitting with slouched posture, able to correct but difficult to maintain due to lack of muscle endurance and history of cerebral palsy.   Gait       Difficulties walking with abnormal gait knee valgus at R LE due to  history of cerebral palsy. L LE using KAFO as baseline. L circumduction gait with crouched and IR R LE lacking hip and knee extension. Use RW.   AROM Knee: mild to moderate flexion contracture bilaterally, improved with manual.   FUNCTIONAL TESTS  Sit to stand: 5x 27 s from 18 inch tall green chair. With R UE support most reps for safety on R arm rest, and touch down support on RW placed in front of chair and L KAFO locked in extension.   6 minutes walk test: 434 feet with rolling walker and L KAFO locked in extension  with SBA for safety. Multiple standing rest breaks due to patient's fatigue on BUEs support on RW.  TREATMENT:  Therapeutic exercise: to centralize symptoms and improve ROM, strength, muscular endurance, and activity tolerance required for successful completion of functional activities.  - completion of FOTO (8 minutes unbilled) - ambulation around clinic for 6 minutes with RW, L KAFO and SBA for safety. Patient had several standing rests and requried max encouragement to continue. Reported feeling legs would give out and BUE fatigue but was able to complete entire 6 minutes without knee buckling.  - sit <> stand x 5 reps from 18 inch chair with intermittant R UE support on chair arm and touchdown BUE support on walker positioned in front of patient.  - seated knee extension stretch with heel on floor with self overpressure at knee. X 45 seconds each side. Educated on how to perform at home and provided handout.  - Seated rows with scapular retraction for improved postural and shoulder girdle strengthening and mobility. Required instruction for technique and cuing to retract, posteriorly tilt, and depress scapulae. Red theraband x10 then x30 with constant cuing.  - Education on HEP including handout  - Education on diagnosis, prognosis, POC, anatomy and physiology of current condition.   Manual therapy: to reduce pain and tissue tension, improve range of motion, neuromodulation,  in order to promote improved ability to complete functional activities. - seated PROM knee extension stretch 3x30 seconds each side. Patient unable to generate as much force independently and benefits from passive stretching. Reported improved pain following.  - seated repeated thoracic extension over back of chair with patient supporting neck with hands and clinician providing overpressure and guidance of movement. P   HOME EXERCISE PROGRAM Access Code: [email or web address]  URL: [email or web address]  Date: 04/08/2019  Prepared by: Rosita Kea   Exercises Seated March - 3 sets - 64mn hold - 1x daily - 7x weekly Side Stepping with Counter Support - 5 minutes - 1x daily - 7x weekly Walking    PT Education - 04/08/19 1624    Education provided  Yes    Education Details  Education on diagnosis, prognosis, POC, anatomy and physiology of current condition. HEP    Person(s) Educated  Patient    Methods  Explanation;Demonstration;Tactile cues;Verbal cues;Handout    Comprehension  Verbalized understanding;Returned demonstration;Verbal cues required;Tactile cues required;Need further instruction       PT Short Term Goals - 03/02/19 1912      PT SHORT TERM GOAL #1   Title  Be independent with initial home exercise program for self-management of symptoms    Baseline  initial HEP provided at IE (02/02/2019);    Time  3    Period  Weeks    Status  Achieved    Target Date  02/23/19        PT Long Term Goals - 04/08/19 1626      PT LONG TERM GOAL #1   Title  Be independent with a long-term home exercise program for self-management of symptoms.    Baseline  initial HEP provided at IE (02/02/2019); currently participating but has not received long term HEP (04/08/2019);    Time  12    Period  Weeks    Status  Partially Met    Target Date  07/01/19      PT LONG TERM GOAL #2   Title  Demonstrate improved FOTO score by 10 units to demonstrate improvement in overall condition and  self-reported functional ability.  Baseline  FOTO = 33(02/02/2019); FOTO = 52 (04/08/2019);    Time  12    Period  Weeks    Status  Partially Met    Target Date  07/01/19      PT LONG TERM GOAL #3   Title  Reduce pain with functional activities to equal or less than 1/10 to allow patient to complete usual activities including ADLs, IADLs, and social engagement with less difficulty.    Baseline  6/10 and 10/10 with prolonged walking (02/02/2019); 7-8/10 (03/17/2019); 8/10 (04/08/2019);    Time  12    Period  Weeks    Status  Partially Met    Target Date  07/01/19      PT LONG TERM GOAL #4   Title  Improved 6 minute walk test by 10% indicating improved LEs functional mobility and strength.    Baseline  Deferred to later session (02/02/2019); 330 ft (02/04/2019); 392 ft (03/17/2019); 434 ft (04/08/2019);    Time  12    Period  Weeks    Status  Achieved    Target Date  07/01/19      PT LONG TERM GOAL #5   Title  Patient will demonstrated 5 time sit to stand with BUEs support within 20s to reduce fall risks and improve LE functional strength.    Baseline  43s with BUEs (from 18.5inch chair) (02/02/2019); 27 s from 18 inch tall chair intermittant UE support (04/08/2019);    Time  12    Period  Weeks    Status  Partially Met    Target Date  07/01/19      Additional Long Term Goals   Additional Long Term Goals  Yes      PT LONG TERM GOAL #6   Title  Improved 6 minute walk test to equal or greater than 550 feet indicating improved LEs functional mobility and strength.    Baseline  434 ft (04/08/2019);    Time  12    Period  Weeks    Status  New    Target Date  07/01/19            Plan - 04/08/19 1623    Clinical Impression Statement  Patient tolerated session well overall and reported reduciton in knee and neck pain by end of session to 4/10. Patient has attended 10 physical therapy treatment sessions and is making progress towards goals. She is engaging in an appropriate HEP at  this point and objectively shows improvements in FOTO score (self-reported function), ambulation distance during 6MWT (endurance, activity tolerance), and 5TSTS test (LE strength, power, balance). Subjectively, she reports improved ability to walk and stand longer and improved balance. Patient continues to present with  knee pain, neck and back pain, stiffness, muscle performance (strength/power/endurance), imbalance, muscle spasm, altered tone, decreased activity tolerance, and altered gait pattern impairments leading to   with functional deficits that limit the patient ability to perform things such as ADLs, IADLs, social participation, caring for and playing with children, engaging in hobbies (playing with kids), and impairs their quality of life. Patient would benefit from continued physical therapy to address remaining impairments and functional limitations to work towards stated goals and return to PLOF or maximal functional independence.    Personal Factors and Comorbidities  Comorbidity 3+    Comorbidities  anxiety, COPD, cerebral palsy, depression, diabetes mellitus, multiple knee surgeries    Examination-Activity Limitations  Bathing;Continence;Stairs;Stand;Dressing;Bend;Sit;Toileting;Squat;Locomotion Level;Transfers;Lift;Caring for Others    Examination-Participation Restrictions  Driving;Yard Work;Cleaning;Community Activity;Meal Prep;Laundry  Stability/Clinical Decision Making  Evolving/Moderate complexity    Rehab Potential  Fair    PT Frequency  2x / week    PT Duration  12 weeks    PT Treatment/Interventions  ADLs/Self Care Home Management;Cryotherapy;Electrical Stimulation;Iontophoresis 41m/ml Dexamethasone;Gait training;Stair training;Functional mobility training;Therapeutic activities;Therapeutic exercise;Balance training;Neuromuscular re-education;Patient/family education;Orthotic Fit/Training;Manual techniques;Scar mobilization;Dry needling;Energy conservation;Joint  Manipulations;Other (comment)    PT Next Visit Plan  pain control, strengthening, stretching    PT Home Exercise Plan  Medbridge: [email or web address]    Consulted and Agree with Plan of Care  Patient       Patient will benefit from skilled therapeutic intervention in order to improve the following deficits and impairments:  Impaired UE functional use, Impaired perceived functional ability, Decreased strength, Increased muscle spasms, Pain, Abnormal gait, Decreased activity tolerance, Decreased endurance, Decreased balance, Decreased coordination, Difficulty walking, Decreased mobility  Visit Diagnosis: Chronic pain of right knee  Muscle weakness (generalized)  Difficulty in walking, not elsewhere classified     Problem List Patient Active Problem List   Diagnosis Date Noted  . MDD (major depressive disorder), recurrent episode, mild ([place] 12/29/2018  . Insomnia due to mental condition 12/29/2018  . Disorder of vein 03/04/2018  . Lumbar sprain 03/04/2018  . Muscle weakness 03/04/2018  . Neck sprain 03/04/2018  . 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  . Postmenopausal bleeding 02/18/2018  . Impingement syndrome of shoulder region 02/04/2018  . Chest pain with moderate risk for cardiac etiology 05/19/2017  . History of depression 04/15/2017  . GAD (generalized anxiety disorder) 04/15/2017  . Chronic daily headache 04/15/2017  . Panic attack 04/15/2017  . Nocturnal enuresis 04/15/2017  . Mild cognitive impairment 04/15/2017  . Loss of memory 01/14/2017  . Pain medication agreement signed 01/08/2017  . Headache disorder 12/04/2016  . Chronic, continuous use of opioids 07/01/2015  . Vertigo 07/01/2015  . Chronic midline low back pain without sciatica 03/21/2015  . Acute renal failure (ARF) ([place] 02/19/2015  . Type 2 diabetes mellitus without complication ([place] 093/81/0175 . Acute pancreatitis 09/04/2014  . Cerebral palsy ([place] 01/05/2014  . Cerebral palsy with spastic/ataxic diplegia  01/05/2014  . Hip pain, chronic 04/28/2013  . Essential hypertension 01/10/2013  . Irritable colon 01/10/2013  . Noninfectious gastroenteritis and colitis 01/10/2013  . Encounter for long-term (current) use of other medications 12/04/2012  . Chronic GERD 08/12/2012  . Epigastric pain 08/12/2012  . Diarrhea 08/12/2012  . Primary localized osteoarthrosis, lower leg 05/07/2012  . Difficulty walking 02/06/2012    SEverlean Alstrom SGraylon Good PT, DPT 04/08/19, 4:30 PM   Addendum to correct title of note and add reporting period.   SEverlean Alstrom SGraylon Good PT, DPT 04/13/19, 6:23 PM  [facility] [address] [place] [address]Phone: [phone]  Fax:  [phone] Name: SCELENE PIPPINSMRN: [id number] of Birth: 101/09/1970

## 2019-04-15 ENCOUNTER — Ambulatory Visit: Payer: Medicare Other | Admitting: Physical Therapy

## 2019-04-28 ENCOUNTER — Telehealth: Payer: Self-pay | Admitting: Psychiatry

## 2019-04-28 DIAGNOSIS — F5105 Insomnia due to other mental disorder: Secondary | ICD-10-CM

## 2019-04-28 DIAGNOSIS — F330 Major depressive disorder, recurrent, mild: Secondary | ICD-10-CM

## 2019-04-28 DIAGNOSIS — F411 Generalized anxiety disorder: Secondary | ICD-10-CM

## 2019-04-28 MED ORDER — FLUOXETINE HCL 20 MG PO CAPS: 20.0000 mg | ORAL_CAPSULE | Freq: Every day | ORAL | 0 refills | Status: DC

## 2019-04-28 MED ORDER — HYDROXYZINE PAMOATE 50 MG PO CAPS: 50.0000 mg | ORAL_CAPSULE | Freq: Every day | ORAL | 0 refills | Status: DC | PRN

## 2019-04-28 MED ORDER — FLUOXETINE HCL 40 MG PO CAPS: 40.0000 mg | ORAL_CAPSULE | Freq: Every day | ORAL | 0 refills | Status: DC

## 2019-04-29 ENCOUNTER — Other Ambulatory Visit: Payer: Self-pay

## 2019-04-29 ENCOUNTER — Encounter: Payer: Self-pay | Admitting: Physical Therapy

## 2019-04-29 ENCOUNTER — Ambulatory Visit: Payer: Medicare Other | Admitting: Physical Therapy

## 2019-04-29 DIAGNOSIS — M25561 Pain in right knee: Secondary | ICD-10-CM | POA: Diagnosis not present

## 2019-04-29 DIAGNOSIS — R262 Difficulty in walking, not elsewhere classified: Secondary | ICD-10-CM

## 2019-04-29 DIAGNOSIS — G8929 Other chronic pain: Secondary | ICD-10-CM

## 2019-04-29 DIAGNOSIS — M6281 Muscle weakness (generalized): Secondary | ICD-10-CM

## 2019-04-29 NOTE — Therapy (Signed)
[place] [facility] [address] [place], [address] Phone: [phone]   Fax:  [phone]  Physical Therapy Treatment  Patient Details  Name: Traci Davis MRN: [id number] Date of Birth: 11-23-1970 Referring Provider (PT): Gearldine Shown, [place]   Encounter Date: 04/29/2019  PT End of Session - 04/29/19 1633    Visit Number  1    Number of Visits  34    Date for PT Re-Evaluation  04/27/19    Authorization Type  [employer] MEDICARE reporting from 02/02/2019    Authorization Time Period  certification period: 02/02/2019 - 04/27/2019    Authorization - Visit Number  1    Authorization - Number of Visits  10    PT Start Time  1620    PT Stop Time  1700    PT Time Calculation (min)  40 min    Activity Tolerance  Patient tolerated treatment well;Patient limited by pain    Behavior During Therapy  [facility] for tasks assessed/performed       Past Medical History:  Diagnosis Date  . Anxiety   . Arthritis   . Asthma   . Cerebral palsy ([place])   . Complication of anesthesia    panic attacks before surgery  . COPD (chronic obstructive pulmonary disease) ([place])   . Depression   . Diabetes mellitus without complication ([place])   . GERD (gastroesophageal reflux disease)   . Hypertension     Past Surgical History:  Procedure Laterality Date  . CESAREAN SECTION  1995  . CHOLECYSTECTOMY    . DILATATION & CURETTAGE/HYSTEROSCOPY WITH MYOSURE N/A 02/20/2018   Procedure: DILATATION & CURETTAGE/HYSTEROSCOPY WITH MYOSURE ENDOMETRIAL POLYPECTOMY;  Surgeon: Will Bonnet, MD;  Location: [facility] ORS;  Service: Gynecology;  Laterality: N/A;  . ESOPHAGOGASTRODUODENOSCOPY (EGD) WITH PROPOFOL N/A 01/01/2019   Procedure: ESOPHAGOGASTRODUODENOSCOPY (EGD) WITH PROPOFOL;  Surgeon: Lin Landsman, MD;  Location: [facility] END[facility]COPY;  Service: Gastroenterology;  Laterality: N/A;  . FOOT CAPSULE RELEASE W/ PERCUTANEOUS HEEL CORD LENGTHENING,  TIBIAL TENDON TRANSFER     age 48 ,and 2010-both legs  . JOINT REPLACEMENT Right    both knees partial replacements dates unknown  . knee replacment     x 5 per pt. last one- left knee 2014. has had 2 on R 3 on L  . TYMPANOPLASTY WITH GRAFT Right 01/08/2018   Procedure: TYMPANOPLASTY WITH POSSIBLE OSSICULAR CHAIN RECONSTRUCTION;  Surgeon: [name], MD;  Location: [facility] ORS;  Service: ENT;  Laterality: Right;    There were no vitals filed for this visit.  TREATMENT:  Therapeutic exercise:to centralize symptoms and improve ROM, strength, muscular endurance, and activity tolerance required for successful completion of functional activities.  -NuStep(large electronic machine) level1using Blower extremities.Seat setting7.For improved extremity mobility, muscular endurance/strength, and activity tolerance; and to induce the analgesic effect of aerobic exercise, stimulate improved joint nutrition, and prepare body structures and systems for following interventions. x71mnutestotal plus time for transfers on/off machine. Consistent cuing to improve SPM throughout exercise including visual and auditory feedback with metronome.No improvement even with persistent cuing. Average SPM: 33.   - sit to stand from elevated seat (large nustep), 2x 10 with touch down support on RW placed in front of patient to for improved comfort.   - standing hip flexion, abduction, and extension with BUE support (except flexion just U UE support). x10 each side each direction. Standing at treadmill bar with CGA assist and multimodal cuing for improved form and safety.     -  Seated without back support rows with scapular retraction for improved postural and shoulder girdle strengthening and mobility. Required instruction for technique and cuing to retract, posteriorly tilt, and depress scapulae. x30 with red theraband.   Multimodal cuingthroughout exercisesto improve form, coordination, and proper muscle  activation.  CGA - min A during standing exercises for safety.   Manual therapy: to reduce pain and tissue tension, improve range of motion, neuromodulation, in order to promote improved ability to complete functional activities - seated PROM knee extension stretch 3x30 seconds each side. Patient unable to generate as much force independently and benefits from passive stretching.   HOME EXERCISE PROGRAM Access Code: [email or web address]  URL: [email or web address]  Date: 04/08/2019  Prepared by: Rosita Kea   Exercises Seated March - 3 sets - 63mn hold - 1x daily - 7x weekly Side Stepping with Counter Support - 5 minutes - 1x daily - 7x weekly Walking    PT Education - 04/29/19 1700    Education provided  Yes    Education Details  Exercise purpose/form. Self management techniques. HEP    Person(s) Educated  Patient    Methods  Explanation;Demonstration;Tactile cues;Verbal cues    Comprehension  Verbalized understanding;Returned demonstration;Verbal cues required;Tactile cues required;Need further instruction       PT Short Term Goals - 03/02/19 1912      PT SHORT TERM GOAL #1   Title  Be independent with initial home exercise program for self-management of symptoms    Baseline  initial HEP provided at IE (02/02/2019);    Time  3    Period  Weeks    Status  Achieved    Target Date  02/23/19        PT Long Term Goals - 04/08/19 1626      PT LONG TERM GOAL #1   Title  Be independent with a long-term home exercise program for self-management of symptoms.    Baseline  initial HEP provided at IE (02/02/2019); currently participating but has not received long term HEP (04/08/2019);    Time  12    Period  Weeks    Status  Partially Met    Target Date  07/01/19      PT LONG TERM GOAL #2   Title  Demonstrate improved FOTO score by 10 units to demonstrate improvement in overall condition and self-reported functional ability.    Baseline  FOTO = 33(02/02/2019); FOTO = 52  (04/08/2019);    Time  12    Period  Weeks    Status  Partially Met    Target Date  07/01/19      PT LONG TERM GOAL #3   Title  Reduce pain with functional activities to equal or less than 1/10 to allow patient to complete usual activities including ADLs, IADLs, and social engagement with less difficulty.    Baseline  6/10 and 10/10 with prolonged walking (02/02/2019); 7-8/10 (03/17/2019); 8/10 (04/08/2019);    Time  12    Period  Weeks    Status  Partially Met    Target Date  07/01/19      PT LONG TERM GOAL #4   Title  Improved 6 minute walk test by 10% indicating improved LEs functional mobility and strength.    Baseline  Deferred to later session (02/02/2019); 330 ft (02/04/2019); 392 ft (03/17/2019); 434 ft (04/08/2019);    Time  12    Period  Weeks    Status  Achieved    Target Date  07/01/19  PT LONG TERM GOAL #5   Title  Patient will demonstrated 5 time sit to stand with BUEs support within 20s to reduce fall risks and improve LE functional strength.    Baseline  43s with BUEs (from 18.5inch chair) (02/02/2019); 27 s from 18 inch tall chair intermittant UE support (04/08/2019);    Time  12    Period  Weeks    Status  Partially Met    Target Date  07/01/19      Additional Long Term Goals   Additional Long Term Goals  Yes      PT LONG TERM GOAL #6   Title  Improved 6 minute walk test to equal or greater than 550 feet indicating improved LEs functional mobility and strength.    Baseline  434 ft (04/08/2019);    Time  12    Period  Weeks    Status  New    Target Date  07/01/19       Plan - 04/29/19 1719    Clinical Impression Statement  Patient tolerated treatment well overall but was limited by fatigue and low mood. Reported at end of session that she is also having neck pain and L wrist pain. Plan to follow up with that next session. Patient has not been to physical therapy for three weeks, so her exercises were not progressed this session. She fatigued quickly and had  difficulty clearing floor with L LE during standing hip exercises. Required frequent cuing to stay on task and for motivation to continue. Patient would benefit from continued management of limiting condition by skilled physical therapist to address remaining impairments and functional limitations to work towards stated goals and return to PLOF or maximal functional independence.    Personal Factors and Comorbidities  Comorbidity 3+    Comorbidities  anxiety, COPD, cerebral palsy, depression, diabetes mellitus, multiple knee surgeries    Examination-Activity Limitations  Bathing;Continence;Stairs;Stand;Dressing;Bend;Sit;Toileting;Squat;Locomotion Level;Transfers;Lift;Caring for Others    Examination-Participation Restrictions  Driving;Yard Work;Cleaning;Community Activity;Meal Prep;Laundry    Stability/Clinical Decision Making  Evolving/Moderate complexity    Rehab Potential  Fair    PT Frequency  2x / week    PT Duration  12 weeks    PT Treatment/Interventions  ADLs/Self Care Home Management;Cryotherapy;Electrical Stimulation;Iontophoresis 67m/ml Dexamethasone;Gait training;Stair training;Functional mobility training;Therapeutic activities;Therapeutic exercise;Balance training;Neuromuscular re-education;Patient/family education;Orthotic Fit/Training;Manual techniques;Scar mobilization;Dry needling;Energy conservation;Joint Manipulations;Other (comment)    PT Next Visit Plan  pain control, strengthening, stretching    PT Home Exercise Plan  Medbridge: [email or web address]    Consulted and Agree with Plan of Care  Patient       Patient will benefit from skilled therapeutic intervention in order to improve the following deficits and impairments:  Impaired UE functional use, Impaired perceived functional ability, Decreased strength, Increased muscle spasms, Pain, Abnormal gait, Decreased activity tolerance, Decreased endurance, Decreased balance, Decreased coordination, Difficulty walking, Decreased  mobility  Visit Diagnosis: Chronic pain of right knee  Muscle weakness (generalized)  Difficulty in walking, not elsewhere classified     Problem List Patient Active Problem List   Diagnosis Date Noted  . MDD (major depressive disorder), recurrent episode, mild ([place] 12/29/2018  . Insomnia due to mental condition 12/29/2018  . Disorder of vein 03/04/2018  . Lumbar sprain 03/04/2018  . Muscle weakness 03/04/2018  . Neck sprain 03/04/2018  . 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  . Postmenopausal bleeding 02/18/2018  . Impingement syndrome of shoulder region 02/04/2018  . Chest pain with moderate risk for cardiac etiology 05/19/2017  . History of depression 04/15/2017  .  GAD (generalized anxiety disorder) 04/15/2017  . Chronic daily headache 04/15/2017  . Panic attack 04/15/2017  . Nocturnal enuresis 04/15/2017  . Mild cognitive impairment 04/15/2017  . Loss of memory 01/14/2017  . Pain medication agreement signed 01/08/2017  . Headache disorder 12/04/2016  . Chronic, continuous use of opioids 07/01/2015  . Vertigo 07/01/2015  . Chronic midline low back pain without sciatica 03/21/2015  . Acute renal failure (ARF) ([place]) 02/19/2015  . Type 2 diabetes mellitus without complication ([place]) 21/79/8102  . Acute pancreatitis 09/04/2014  . Cerebral palsy ([place]) 01/05/2014  . 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 Hip pain, chronic 04/28/2013  . Essential hypertension 01/10/2013  . Irritable colon 01/10/2013  . Noninfectious gastroenteritis and colitis 01/10/2013  . Encounter for long-term (current) use of other medications 12/04/2012  . Chronic GERD 08/12/2012  . Epigastric pain 08/12/2012  . Diarrhea 08/12/2012  . Primary localized osteoarthrosis, lower leg 05/07/2012  . Difficulty walking 02/06/2012    Everlean Alstrom. Graylon Good, PT, DPT 04/29/19, 5:21 PM  [place] [facility] [address] [place],  [address] Phone: [phone]   Fax:  [phone]  Name: Traci Davis MRN: [id number] Date of Birth: Sep 26, 1970

## 2019-05-04 ENCOUNTER — Ambulatory Visit: Payer: Medicare Other | Admitting: Physical Therapy

## 2019-05-04 ENCOUNTER — Ambulatory Visit: Payer: Medicare Other | Attending: Physician Assistant | Admitting: Physical Therapy

## 2019-05-04 ENCOUNTER — Other Ambulatory Visit: Payer: Self-pay

## 2019-05-04 ENCOUNTER — Encounter: Payer: Self-pay | Admitting: Physical Therapy

## 2019-05-04 DIAGNOSIS — R262 Difficulty in walking, not elsewhere classified: Secondary | ICD-10-CM | POA: Insufficient documentation

## 2019-05-04 DIAGNOSIS — M25561 Pain in right knee: Secondary | ICD-10-CM | POA: Insufficient documentation

## 2019-05-04 DIAGNOSIS — M6281 Muscle weakness (generalized): Secondary | ICD-10-CM | POA: Insufficient documentation

## 2019-05-04 DIAGNOSIS — G8929 Other chronic pain: Secondary | ICD-10-CM | POA: Insufficient documentation

## 2019-05-04 NOTE — Therapy (Signed)
[place] [facility] [address] [place], [address] Phone: [phone]   Fax:  [phone]  Physical Therapy Treatment  Patient Details  Name: Traci Davis MRN: [id number] Date of Birth: 02-Feb-1971 Referring Provider (PT): Gearldine Shown, [place]   Encounter Date: 05/04/2019  PT End of Session - 05/04/19 1311    Visit Number  2    Number of Visits  34    Date for PT Re-Evaluation  04/27/19    Authorization Type  [employer] MEDICARE reporting from 02/02/2019    Authorization Time Period  certification period: 02/02/2019 - 04/27/2019    Authorization - Visit Number  2    Authorization - Number of Visits  10    PT Start Time  9622    PT Stop Time  1347    PT Time Calculation (min)  35 min    Activity Tolerance  Patient tolerated treatment well;Patient limited by fatigue    Behavior During Therapy  [facility] for tasks assessed/performed       Past Medical History:  Diagnosis Date  . Anxiety   . Arthritis   . Asthma   . Cerebral palsy ([place])   . Complication of anesthesia    panic attacks before surgery  . COPD (chronic obstructive pulmonary disease) ([place])   . Depression   . Diabetes mellitus without complication ([place])   . GERD (gastroesophageal reflux disease)   . Hypertension     Past Surgical History:  Procedure Laterality Date  . CESAREAN SECTION  1995  . CHOLECYSTECTOMY    . DILATATION & CURETTAGE/HYSTEROSCOPY WITH MYOSURE N/A 02/20/2018   Procedure: DILATATION & CURETTAGE/HYSTEROSCOPY WITH MYOSURE ENDOMETRIAL POLYPECTOMY;  Surgeon: Will Bonnet, MD;  Location: [facility] ORS;  Service: Gynecology;  Laterality: N/A;  . ESOPHAGOGASTRODUODENOSCOPY (EGD) WITH PROPOFOL N/A 01/01/2019   Procedure: ESOPHAGOGASTRODUODENOSCOPY (EGD) WITH PROPOFOL;  Surgeon: Lin Landsman, MD;  Location: [facility] ENDOSCOPY;  Service: Gastroenterology;  Laterality: N/A;  . FOOT CAPSULE RELEASE W/ PERCUTANEOUS HEEL CORD LENGTHENING,  TIBIAL TENDON TRANSFER     age 49 ,and 2010-both legs  . JOINT REPLACEMENT Right    both knees partial replacements dates unknown  . knee replacment     x 5 per pt. last one- left knee 2014. has had 2 on R 3 on L  . TYMPANOPLASTY WITH GRAFT Right 01/08/2018   Procedure: TYMPANOPLASTY WITH POSSIBLE OSSICULAR CHAIN RECONSTRUCTION;  Surgeon: [name], MD;  Location: [facility] ORS;  Service: ENT;  Laterality: Right;    There were no vitals filed for this visit.   OBJECTIVE - 6MWT: 351 feet with RW   TREATMENT:  Therapeutic exercise:to centralize symptoms and improve ROM, strength, muscular endurance, and activity tolerance required for successful completion of functional activities. - ambulation around clinic for 6 minutes with RW, L KAFO and SBA for safety. Patient had several standing rests and requried max encouragement to continue. Required several standing rest breaks complaining of shortness of breath and chest discomfort (mid sternum, decreased with rest; SpO2 measured several times and was 95% or over).  - sit to stand from elevated seat (20.5 inches), 2x 10 with touch down support on RW placed in front of patient to for improved comfort.  Balancing each time.   Seated without back supportfor improved trunk, postural, and LE strength: - rows with scapular retraction for improved postural and shoulder girdle strengthening and mobility. scapulaeRequired instruction for technique and cuing to retract, posteriorly tilt, and depress .x30 with  blue theraband.  - marching 2 x 1 min - long arc quad, 7.5# ankle weights, 2x15 each side - hip abduction with red theraband around distal thighs, 2x15  Multimodal cuingthroughout exercisesto improve form, coordination, and proper muscle activation.  CGA - min A during standing exercises for safety.   HOME EXERCISE PROGRAM Access Code: [email or web address]     URL: [email or web address]    Date: 04/08/2019  Prepared by: Rosita Kea    Exercises Seated March - 3 sets - 45mn hold - 1x daily - 7x weekly Side Stepping with Counter Support - 5 minutes - 1x daily - 7x weekly Walking     PT Education - 05/04/19 1353    Education provided  Yes    Education Details  Exercise purpose/form. Self management techniques.    Person(s) Educated  Patient    Methods  Explanation;Demonstration;Tactile cues;Verbal cues    Comprehension  Verbalized understanding;Returned demonstration;Verbal cues required;Tactile cues required;Need further instruction       PT Short Term Goals - 03/02/19 1912      PT SHORT TERM GOAL #1   Title  Be independent with initial home exercise program for self-management of symptoms    Baseline  initial HEP provided at IE (02/02/2019);    Time  3    Period  Weeks    Status  Achieved    Target Date  02/23/19        PT Long Term Goals - 04/08/19 1626      PT LONG TERM GOAL #1   Title  Be independent with a long-term home exercise program for self-management of symptoms.    Baseline  initial HEP provided at IE (02/02/2019); currently participating but has not received long term HEP (04/08/2019);    Time  12    Period  Weeks    Status  Partially Met    Target Date  07/01/19      PT LONG TERM GOAL #2   Title  Demonstrate improved FOTO score by 10 units to demonstrate improvement in overall condition and self-reported functional ability.    Baseline  FOTO = 33(02/02/2019); FOTO = 52 (04/08/2019);    Time  12    Period  Weeks    Status  Partially Met    Target Date  07/01/19      PT LONG TERM GOAL #3   Title  Reduce pain with functional activities to equal or less than 1/10 to allow patient to complete usual activities including ADLs, IADLs, and social engagement with less difficulty.    Baseline  6/10 and 10/10 with prolonged walking (02/02/2019); 7-8/10 (03/17/2019); 8/10 (04/08/2019);    Time  12    Period  Weeks    Status  Partially Met    Target Date  07/01/19      PT LONG TERM GOAL #4    Title  Improved 6 minute walk test by 10% indicating improved LEs functional mobility and strength.    Baseline  Deferred to later session (02/02/2019); 330 ft (02/04/2019); 392 ft (03/17/2019); 434 ft (04/08/2019);    Time  12    Period  Weeks    Status  Achieved    Target Date  07/01/19      PT LONG TERM GOAL #5   Title  Patient will demonstrated 5 time sit to stand with BUEs support within 20s to reduce fall risks and improve LE functional strength.    Baseline  43s with BUEs (from 18.5inch chair) (02/02/2019); 27 s from  18 inch tall chair intermittant UE support (04/08/2019);    Time  12    Period  Weeks    Status  Partially Met    Target Date  07/01/19      Additional Long Term Goals   Additional Long Term Goals  Yes      PT LONG TERM GOAL #6   Title  Improved 6 minute walk test to equal or greater than 550 feet indicating improved LEs functional mobility and strength.    Baseline  434 ft (04/08/2019);    Time  12    Period  Weeks    Status  New    Target Date  07/01/19        Plan - 05/04/19 1356    Clinical Impression Statement  Patient tolerated treatment well overall and was able to complete more exercises than at some sessions. She was running behind today so her session was shorter. She fatigued quickly and continues to be concerned that her pulmonary function is limiting her. Her SpO2 remained at or above 95% when measured. She required consistent encouragement and cuing for improved form and participation. Requested she bring in both her walkers next session so it can be determined if the wheels from her old walker are compatible with her new walker and if they are still in good condition. Patient continues to be limited by pain, weakness, stiffness, spacticity, and decreased activity tolerance. Patient would benefit from continued management of limiting condition by skilled physical therapist to address remaining impairments and functional limitations to work towards stated  goals and return to PLOF or maximal functional independence.    Personal Factors and Comorbidities  Comorbidity 3+    Comorbidities  anxiety, COPD, cerebral palsy, depression, diabetes mellitus, multiple knee surgeries    Examination-Activity Limitations  Bathing;Continence;Stairs;Stand;Dressing;Bend;Sit;Toileting;Squat;Locomotion Level;Transfers;Lift;Caring for Others    Examination-Participation Restrictions  Driving;Yard Work;Cleaning;Community Activity;Meal Prep;Laundry    Stability/Clinical Decision Making  Evolving/Moderate complexity    Rehab Potential  Fair    PT Frequency  2x / week    PT Duration  12 weeks    PT Treatment/Interventions  ADLs/Self Care Home Management;Cryotherapy;Electrical Stimulation;Iontophoresis 74m/ml Dexamethasone;Gait training;Stair training;Functional mobility training;Therapeutic activities;Therapeutic exercise;Balance training;Neuromuscular re-education;Patient/family education;Orthotic Fit/Training;Manual techniques;Scar mobilization;Dry needling;Energy conservation;Joint Manipulations;Other (comment)    PT Next Visit Plan  pain control, strengthening, stretching    PT Home Exercise Plan  Medbridge: [email or web address]    Consulted and Agree with Plan of Care  Patient       Patient will benefit from skilled therapeutic intervention in order to improve the following deficits and impairments:  Impaired UE functional use, Impaired perceived functional ability, Decreased strength, Increased muscle spasms, Pain, Abnormal gait, Decreased activity tolerance, Decreased endurance, Decreased balance, Decreased coordination, Difficulty walking, Decreased mobility  Visit Diagnosis: Chronic pain of right knee  Muscle weakness (generalized)  Difficulty in walking, not elsewhere classified     Problem List Patient Active Problem List   Diagnosis Date Noted  . MDD (major depressive disorder), recurrent episode, mild ([place] 12/29/2018  . Insomnia due to mental condition  12/29/2018  . Disorder of vein 03/04/2018  . Lumbar sprain 03/04/2018  . Muscle weakness 03/04/2018  . Neck sprain 03/04/2018  . 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  . Postmenopausal bleeding 02/18/2018  . Impingement syndrome of shoulder region 02/04/2018  . Chest pain with moderate risk for cardiac etiology 05/19/2017  . History of depression 04/15/2017  . GAD (generalized anxiety disorder) 04/15/2017  . Chronic daily headache 04/15/2017  . Panic attack  04/15/2017  . Nocturnal enuresis 04/15/2017  . Mild cognitive impairment 04/15/2017  . Loss of memory 01/14/2017  . Pain medication agreement signed 01/08/2017  . Headache disorder 12/04/2016  . Chronic, continuous use of opioids 07/01/2015  . Vertigo 07/01/2015  . Chronic midline low back pain without sciatica 03/21/2015  . Acute renal failure (ARF) ([place]) 02/19/2015  . Type 2 diabetes mellitus without complication ([place]) 82/65/8718  . Acute pancreatitis 09/04/2014  . Cerebral palsy ([place]) 01/05/2014  . 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 Hip pain, chronic 04/28/2013  . Essential hypertension 01/10/2013  . Irritable colon 01/10/2013  . Noninfectious gastroenteritis and colitis 01/10/2013  . Encounter for long-term (current) use of other medications 12/04/2012  . Chronic GERD 08/12/2012  . Epigastric pain 08/12/2012  . Diarrhea 08/12/2012  . Primary localized osteoarthrosis, lower leg 05/07/2012  . Difficulty walking 02/06/2012    Everlean Alstrom. Graylon Good, PT, DPT 05/04/19, 1:57 PM  [place] [facility] [address] [place], [address] Phone: [phone]   Fax:  [phone]  Name: Traci Davis MRN: [id number] Date of Birth: 1970-05-25

## 2019-05-04 NOTE — Telephone Encounter (Signed)
Called patient - sent vistaril to pharmacy for anxiety

## 2019-05-06 ENCOUNTER — Encounter: Payer: Self-pay | Admitting: Physical Therapy

## 2019-05-06 ENCOUNTER — Ambulatory Visit: Payer: Medicare Other | Admitting: Physical Therapy

## 2019-05-06 ENCOUNTER — Encounter: Payer: Medicare Other | Admitting: Physical Therapy

## 2019-05-06 ENCOUNTER — Other Ambulatory Visit: Payer: Self-pay

## 2019-05-06 VITALS — BP 124/76 | HR 70

## 2019-05-06 DIAGNOSIS — M6281 Muscle weakness (generalized): Secondary | ICD-10-CM

## 2019-05-06 DIAGNOSIS — M25561 Pain in right knee: Secondary | ICD-10-CM | POA: Diagnosis not present

## 2019-05-06 DIAGNOSIS — G8929 Other chronic pain: Secondary | ICD-10-CM

## 2019-05-06 DIAGNOSIS — R262 Difficulty in walking, not elsewhere classified: Secondary | ICD-10-CM

## 2019-05-06 NOTE — Therapy (Signed)
[facility] [address], [place], [address] Phone: [phone]   Fax:  [phone]  Physical Therapy Treatment  Patient Details  Name: Traci Davis MRN: [id number] Date of Birth: February 25, 1971 Referring Provider (PT): Gearldine Shown, [place]   Encounter Date: 05/06/2019  PT End of Session - 05/06/19 1513    Visit Number  13    Number of Visits  34    Date for PT Re-Evaluation  07/01/19    Authorization Type  [employer] MEDICARE reporting from 04/29/2019    Authorization - Visit Number  3    Authorization - Number of Visits  10    PT Start Time  5053    PT Stop Time  1554    PT Time Calculation (min)  40 min    Activity Tolerance  Patient tolerated treatment well;Patient limited by fatigue    Behavior During Therapy  [facility] for tasks assessed/performed       Past Medical History:  Diagnosis Date  . Anxiety   . Arthritis   . Asthma   . Cerebral palsy ([place])   . Complication of anesthesia    panic attacks before surgery  . COPD (chronic obstructive pulmonary disease) ([place])   . Depression   . Diabetes mellitus without complication ([place])   . GERD (gastroesophageal reflux disease)   . Hypertension     Past Surgical History:  Procedure Laterality Date  . CESAREAN SECTION  1995  . CHOLECYSTECTOMY    . DILATATION & CURETTAGE/HYSTEROSCOPY WITH MYOSURE N/A 02/20/2018   Procedure: DILATATION & CURETTAGE/HYSTEROSCOPY WITH MYOSURE ENDOMETRIAL POLYPECTOMY;  Surgeon: Will Bonnet, MD;  Location: [facility] ORS;  Service: Gynecology;  Laterality: N/A;  . ESOPHAGOGASTRODUODENOSCOPY (EGD) WITH PROPOFOL N/A 01/01/2019   Procedure: ESOPHAGOGASTRODUODENOSCOPY (EGD) WITH PROPOFOL;  Surgeon: Lin Landsman, MD;  Location: [facility] ENDOSCOPY;  Service: Gastroenterology;  Laterality: N/A;  . FOOT CAPSULE RELEASE W/ PERCUTANEOUS HEEL CORD LENGTHENING, TIBIAL TENDON TRANSFER     age 49 ,and 2010-both legs  . JOINT REPLACEMENT  Right    both knees partial replacements dates unknown  . knee replacment     x 5 per pt. last one- left knee 2014. has had 2 on R 3 on L  . TYMPANOPLASTY WITH GRAFT Right 01/08/2018   Procedure: TYMPANOPLASTY WITH POSSIBLE OSSICULAR CHAIN RECONSTRUCTION;  Surgeon: [name], MD;  Location: [facility] ORS;  Service: ENT;  Laterality: Right;    Vitals:   05/06/19 1537 05/06/19 1539  BP: (!) 144/74 124/76  Pulse: 70   SpO2: 98%     Subjective Assessment - 05/06/19 1537    Subjective  Patinet reports she is tired from taking her daughter places when she didn't have a car. States her back is hurting on the R 5/10 "not too bad".  States she had worse pain yesterday. Feels like she has been doing too much lately.    Pertinent History  Patient is a 49 y.o. female who presents to outpatient physical therapy with a referral for medical diagnosis of R osteoarthritis knee pain. This patient's chief complaints consist of constant pain R knee that has been gradually increasing in severity since many years ago, leading to the following functional deficits: difficulty with housekeeping activities, prolonged sitting, quality of life, lifting, etc.. Relevant past medical history and comorbidities include anxiety, COPD, cerebral palsy, depression, diabetes mellitus; surgeries include: bilateral partial knee replacement, foot capsule release with percutaneous heel cord lengthening,    Limitations  Sitting;Lifting;Standing;Walking;House hold activities    How long can you sit comfortably?  45 minutes    How long can you stand comfortably?  5 minutes    How long can you walk comfortably?  25 minutes    Diagnostic tests  X-rays    Patient Stated Goals  To get up and walking    Currently in Pain?  Yes    Pain Score  5     Pain Onset  More than a month ago       OBJECTIVE - 6MWT: 244 feet with RW (discontinued 1 min short due to feeling tired).  TREATMENT:  Therapeutic exercise:to centralize symptoms and  improve ROM, strength, muscular endurance, and activity tolerance required for successful completion of functional activities. - ambulation around clinic for 6 minutes with RW, L KAFO and SBA for safety. Patient had several standing rests and requried max encouragement to continue. Required several standing rest breaks. Eventually stopped and said she felt like she was going to pass out. SpO2 measured at that point was 98% and HR in upper 70s. Upon further questioning stated she felt very tired. Also states she has some chest pain and complains of fluttering in the chest. Pulse at the wrist palpated to be steady and consistent with HR on pulse ox. Provided rest break. 144/74 mmHg with strong steady beat; taken after some rest. Re-taken 2 minutes later (124/76 mmHg) when pt reports her systolic blood pressure is usually lower. Auscultated over heart and did not hear any abnormal sounds. Pain subsided with rest.   - sit to stand from elevated seat (20.5 inches),3x 10with touch down support on RW placed in front of patient to for improved comfort. Balancing each time. No chest pain.   Seated without back supportfor improved trunk, postural, and LE strength: - rows with scapular retraction for improved postural and shoulder girdle strengthening and mobility. Scapulae. Required instruction for technique and cuing to retract, posteriorly tilt, and depress .x30 with blue theraband.  - hip abduction with red theraband around distal thighs, 2x15  Multimodal cuingthroughout exercisesto improve form, coordination, and proper muscle activation.CGA - min A during standing exercises for safety.  HOME EXERCISE PROGRAM Access Code: [email or web address] URL: [email or web address] Date: 04/08/2019  Prepared by: Rosita Kea   Exercises Seated March - 3 sets - 88mn hold - 1x daily - 7x weekly Side Stepping with Counter Support - 5 minutes - 1x daily - 7x weekly Walking    PT Education -  05/06/19 1513    Education provided  Yes    Education Details  Exercise purpose/form. Self management techniques.    Person(s) Educated  Patient    Methods  Explanation;Demonstration;Tactile cues;Verbal cues    Comprehension  Verbalized understanding;Returned demonstration;Verbal cues required;Tactile cues required;Need further instruction       PT Short Term Goals - 03/02/19 1912      PT SHORT TERM GOAL #1   Title  Be independent with initial home exercise program for self-management of symptoms    Baseline  initial HEP provided at IE (02/02/2019);    Time  3    Period  Weeks    Status  Achieved    Target Date  02/23/19        PT Long Term Goals - 04/08/19 1626      PT LONG TERM GOAL #1   Title  Be independent with a long-term home exercise program for self-management of symptoms.    Baseline  initial HEP provided at IE (02/02/2019);  currently participating but has not received long term HEP (04/08/2019);    Time  12    Period  Weeks    Status  Partially Met    Target Date  07/01/19      PT LONG TERM GOAL #2   Title  Demonstrate improved FOTO score by 10 units to demonstrate improvement in overall condition and self-reported functional ability.    Baseline  FOTO = 33(02/02/2019); FOTO = 52 (04/08/2019);    Time  12    Period  Weeks    Status  Partially Met    Target Date  07/01/19      PT LONG TERM GOAL #3   Title  Reduce pain with functional activities to equal or less than 1/10 to allow patient to complete usual activities including ADLs, IADLs, and social engagement with less difficulty.    Baseline  6/10 and 10/10 with prolonged walking (02/02/2019); 7-8/10 (03/17/2019); 8/10 (04/08/2019);    Time  12    Period  Weeks    Status  Partially Met    Target Date  07/01/19      PT LONG TERM GOAL #4   Title  Improved 6 minute walk test by 10% indicating improved LEs functional mobility and strength.    Baseline  Deferred to later session (02/02/2019); 330 ft (02/04/2019);  392 ft (03/17/2019); 434 ft (04/08/2019);    Time  12    Period  Weeks    Status  Achieved    Target Date  07/01/19      PT LONG TERM GOAL #5   Title  Patient will demonstrated 5 time sit to stand with BUEs support within 20s to reduce fall risks and improve LE functional strength.    Baseline  43s with BUEs (from 18.5inch chair) (02/02/2019); 27 s from 18 inch tall chair intermittant UE support (04/08/2019);    Time  12    Period  Weeks    Status  Partially Met    Target Date  07/01/19      Additional Long Term Goals   Additional Long Term Goals  Yes      PT LONG TERM GOAL #6   Title  Improved 6 minute walk test to equal or greater than 550 feet indicating improved LEs functional mobility and strength.    Baseline  434 ft (04/08/2019);    Time  12    Period  Weeks    Status  New    Target Date  07/01/19            Plan - 05/06/19 1604    Clinical Impression Statement  Patient tolerated treatment well overall but was limited by fatigue today and was not able to walk as far. Patient become concerned about fatigue and chest pain, so time was taken for additional rest and to check vitals, which were WNL. Discussed her history of panic attacks and prior cardiovascular work up that came out okay in the past. All objective measures did not suggest any cardiac problems. Advised her to follow up with physician if she continues to have concerns. Patient reported it had been checked before and was able to calm herself and complete remainder of session with no additional complaints of chest pain. Patient would benefit from continued management of limiting condition by skilled physical therapist to address remaining impairments and functional limitations to work towards stated goals and return to PLOF or maximal functional independence.    Personal Factors and Comorbidities  Comorbidity 3+  Comorbidities  anxiety, COPD, cerebral palsy, depression, diabetes mellitus, multiple knee surgeries     Examination-Activity Limitations  Bathing;Continence;Stairs;Stand;Dressing;Bend;Sit;Toileting;Squat;Locomotion Level;Transfers;Lift;Caring for Others    Examination-Participation Restrictions  Driving;Yard Work;Cleaning;Community Activity;Meal Prep;Laundry    Stability/Clinical Decision Making  Evolving/Moderate complexity    Rehab Potential  Fair    PT Frequency  2x / week    PT Duration  12 weeks    PT Treatment/Interventions  ADLs/Self Care Home Management;Cryotherapy;Electrical Stimulation;Iontophoresis 83m/ml Dexamethasone;Gait training;Stair training;Functional mobility training;Therapeutic activities;Therapeutic exercise;Balance training;Neuromuscular re-education;Patient/family education;Orthotic Fit/Training;Manual techniques;Scar mobilization;Dry needling;Energy conservation;Joint Manipulations;Other (comment)    PT Next Visit Plan  pain control, strengthening, stretching    PT Home Exercise Plan  Medbridge: [email or web address]    Consulted and Agree with Plan of Care  Patient       Patient will benefit from skilled therapeutic intervention in order to improve the following deficits and impairments:  Impaired UE functional use, Impaired perceived functional ability, Decreased strength, Increased muscle spasms, Pain, Abnormal gait, Decreased activity tolerance, Decreased endurance, Decreased balance, Decreased coordination, Difficulty walking, Decreased mobility  Visit Diagnosis: Chronic pain of right knee  Muscle weakness (generalized)  Difficulty in walking, not elsewhere classified     Problem List Patient Active Problem List   Diagnosis Date Noted  . MDD (major depressive disorder), recurrent episode, mild ([facility] 12/29/2018  . Insomnia due to mental condition 12/29/2018  . Disorder of vein 03/04/2018  . Lumbar sprain 03/04/2018  . Muscle weakness 03/04/2018  . Neck sprain 03/04/2018  . 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  . Postmenopausal bleeding 02/18/2018  . Impingement syndrome of  shoulder region 02/04/2018  . Chest pain with moderate risk for cardiac etiology 05/19/2017  . History of depression 04/15/2017  . GAD (generalized anxiety disorder) 04/15/2017  . Chronic daily headache 04/15/2017  . Panic attack 04/15/2017  . Nocturnal enuresis 04/15/2017  . Mild cognitive impairment 04/15/2017  . Loss of memory 01/14/2017  . Pain medication agreement signed 01/08/2017  . Headache disorder 12/04/2016  . Chronic, continuous use of opioids 07/01/2015  . Vertigo 07/01/2015  . Chronic midline low back pain without sciatica 03/21/2015  . Acute renal failure (ARF) ([place] 02/19/2015  . Type 2 diabetes mellitus without complication ([place] 050/35/4656 . Acute pancreatitis 09/04/2014  . Cerebral palsy ([place] 01/05/2014  . 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 Hip pain, chronic 04/28/2013  . Essential hypertension 01/10/2013  . Irritable colon 01/10/2013  . Noninfectious gastroenteritis and colitis 01/10/2013  . Encounter for long-term (current) use of other medications 12/04/2012  . Chronic GERD 08/12/2012  . Epigastric pain 08/12/2012  . Diarrhea 08/12/2012  . Primary localized osteoarthrosis, lower leg 05/07/2012  . Difficulty walking 02/06/2012    SEverlean Alstrom SGraylon Good PT, DPT 05/06/19, 4:05 PM  [facility] [address] [place] [id number]: [phone]  Fax:  [phone] Name: Traci BAYNESMRN: [id number] of Birth: 106/19/1972

## 2019-05-13 ENCOUNTER — Ambulatory Visit: Payer: Medicare Other | Admitting: Physical Therapy

## 2019-05-14 ENCOUNTER — Ambulatory Visit: Payer: Medicare Other | Admitting: Physical Therapy

## 2019-05-14 ENCOUNTER — Encounter: Payer: Self-pay | Admitting: Physical Therapy

## 2019-05-14 DIAGNOSIS — M25561 Pain in right knee: Secondary | ICD-10-CM | POA: Diagnosis not present

## 2019-05-14 DIAGNOSIS — M6281 Muscle weakness (generalized): Secondary | ICD-10-CM

## 2019-05-14 DIAGNOSIS — R262 Difficulty in walking, not elsewhere classified: Secondary | ICD-10-CM

## 2019-05-14 DIAGNOSIS — G8929 Other chronic pain: Secondary | ICD-10-CM

## 2019-05-14 NOTE — Therapy (Signed)
[facility] [address] [place], [address] Phone: [phone]   Fax:  [phone]  Physical Therapy Treatment  Patient Details  Name: Traci Davis MRN: [id number] Date of Birth: 10/16/1970 Referring Provider (PT): Gearldine Shown, [place]   Encounter Date: 05/14/2019  PT End of Session - 05/14/19 1804    Visit Number  14    Number of Visits  34    Date for PT Re-Evaluation  07/01/19    Authorization Type  [employer] MEDICARE reporting from 04/29/2019    Authorization - Visit Number  4    Authorization - Number of Visits  10    PT Start Time  1800    PT Stop Time  1840    PT Time Calculation (min)  40 min    Activity Tolerance  Patient tolerated treatment well;Patient limited by fatigue    Behavior During Therapy  [facility] for tasks assessed/performed       Past Medical History:  Diagnosis Date  . Anxiety   . Arthritis   . Asthma   . Cerebral palsy ([place])   . Complication of anesthesia    panic attacks before surgery  . COPD (chronic obstructive pulmonary disease) ([place])   . Depression   . Diabetes mellitus without complication ([place])   . GERD (gastroesophageal reflux disease)   . Hypertension     Past Surgical History:  Procedure Laterality Date  . CESAREAN SECTION  1995  . CHOLECYSTECTOMY    . DILATATION & CURETTAGE/HYSTEROSCOPY WITH MYOSURE N/A 02/20/2018   Procedure: DILATATION & CURETTAGE/HYSTEROSCOPY WITH MYOSURE ENDOMETRIAL POLYPECTOMY;  Surgeon: Will Bonnet, MD;  Location: [facility] ORS;  Service: Gynecology;  Laterality: N/A;  . ESOPHAGOGASTRODUODENOSCOPY (EGD) WITH PROPOFOL N/A 01/01/2019   Procedure: ESOPHAGOGASTRODUODENOSCOPY (EGD) WITH PROPOFOL;  Surgeon: Lin Landsman, MD;  Location: [facility] ENDOSCOPY;  Service: Gastroenterology;  Laterality: N/A;  . FOOT CAPSULE RELEASE W/ PERCUTANEOUS HEEL CORD LENGTHENING, TIBIAL TENDON TRANSFER     age 68 ,and 2010-both legs  . JOINT REPLACEMENT  Right    both knees partial replacements dates unknown  . knee replacment     x 5 per pt. last one- left knee 2014. has had 2 on R 3 on L  . TYMPANOPLASTY WITH GRAFT Right 01/08/2018   Procedure: TYMPANOPLASTY WITH POSSIBLE OSSICULAR CHAIN RECONSTRUCTION;  Surgeon: [name], MD;  Location: [facility] ORS;  Service: ENT;  Laterality: Right;    There were no vitals filed for this visit.  Subjective Assessment - 05/14/19 1801    Subjective  Patient reports she is hurting a lot in her back upon arrival, rated 8/10, after keeping her young grandchild all day. She has a little pain in her knees but not so much. Her granddaughter weighs almost 40 pounds. She reports her knees were pretty sore following last treatment session. She states her left knee is turning in more when she stands up and this hurts. She reports she has not practiced much walking at home because it is cold and she has been having trouble breathing. She has been going back and forth from the bedroom a few times to get some walking in.    Pertinent History  Patient is a 49 y.o. female who presents to outpatient physical therapy with a referral for medical diagnosis of R osteoarthritis knee pain. This patient's chief complaints consist of constant pain R knee that has been gradually increasing in severity since many years ago, leading to the  following functional deficits: difficulty with housekeeping activities, prolonged sitting, quality of life, lifting, etc.. Relevant past medical history and comorbidities include anxiety, COPD, cerebral palsy, depression, diabetes mellitus; surgeries include: bilateral partial knee replacement, foot capsule release with percutaneous heel cord lengthening,    Limitations  Sitting;Lifting;Standing;Walking;House hold activities    How long can you sit comfortably?  45 minutes    How long can you stand comfortably?  5 minutes    How long can you walk comfortably?  25 minutes    Diagnostic tests  X-rays     Patient Stated Goals  To get up and walking    Currently in Pain?  Yes    Pain Score  8     Pain Location  Back    Pain Radiating Towards  to left butt cheeck    Pain Onset  More than a month ago       OBJECTIVE - 6MWT: 244 feet with RW(discontinued 1 min short due to feeling tired).  TREATMENT:  Therapeutic exercise:to centralize symptoms and improve ROM, strength, muscular endurance, and activity tolerance required for successful completion of functional activities.  -NuStep(large electronic machine) level1using Blower extremities.Seat setting7.For improved extremity mobility, muscular endurance/strength, and activity tolerance; and to induce the analgesic effect of aerobic exercise, stimulate improved joint nutrition, and prepare body structures and systems for following interventions. x31mnutestotal plus time for transfers on/off machine. During subjective exam. Discontinued due to increasing pain into left glute.Average SPM: 29. - sit <> prone with min A.  - ambulation x 50 feet with RW to assess response to manual therapy - sit to stand from elevated seat (20.5 inches),3x 10with touch down support on RW placed in front of patient to for improved comfort.Balancing each time. - attempted to perform standing hip ER on the rotation discs with BUE support but unable to complete without unacceptable compensations.   Seated without back supportfor improved trunk, postural, and LE strength: -rows with scapular retraction for improved postural and shoulder girdle strengthening and mobility. Scapulae. Required instruction for technique and cuing to retract, posteriorly tilt, and depress .x30 withbluetheraband.  - hip abduction with red theraband around distal thighs, 2x15 - marching x20 each side.   Multimodal cuingthroughout exercisesto improve form, coordination, and proper muscle activation.CGA - min A during standing exercises for safety.  Manual therapy:  to reduce pain and tissue tension, improve range of motion, neuromodulation, in order to promote improved ability to complete functional activities. Prone lying and prone on elbows during manual: CPA grade I-II along mid thoracic to L5 ~ 10 reps x2 each level to decrease pain.  - STM to lumbar and lower thoracic paraspinals and left glute region to reduce pain and tension.   HOME EXERCISE PROGRAM Access Code: [email or web address] URL: [email or web address] Date: 04/08/2019  Prepared by: SRosita Kea  Exercises Seated March - 3 sets - 125m hold - 1x daily - 7x weekly Side Stepping with Counter Support - 5 minutes - 1x daily - 7x weekly Walking   PT Education - 05/14/19 1804    Education provided  Yes    Education Details  Exercise purpose/form. Self management techniques.    Person(s) Educated  Patient    Methods  Explanation;Demonstration;Tactile cues;Verbal cues    Comprehension  Verbalized understanding;Returned demonstration;Verbal cues required;Tactile cues required;Need further instruction       PT Short Term Goals - 03/02/19 1912      PT SHORT TERM GOAL #1   Title  Be independent with initial home exercise  program for self-management of symptoms    Baseline  initial HEP provided at IE (02/02/2019);    Time  3    Period  Weeks    Status  Achieved    Target Date  02/23/19        PT Long Term Goals - 04/08/19 1626      PT LONG TERM GOAL #1   Title  Be independent with a long-term home exercise program for self-management of symptoms.    Baseline  initial HEP provided at IE (02/02/2019); currently participating but has not received long term HEP (04/08/2019);    Time  12    Period  Weeks    Status  Partially Met    Target Date  07/01/19      PT LONG TERM GOAL #2   Title  Demonstrate improved FOTO score by 10 units to demonstrate improvement in overall condition and self-reported functional ability.    Baseline  FOTO = 33(02/02/2019); FOTO = 52  (04/08/2019);    Time  12    Period  Weeks    Status  Partially Met    Target Date  07/01/19      PT LONG TERM GOAL #3   Title  Reduce pain with functional activities to equal or less than 1/10 to allow patient to complete usual activities including ADLs, IADLs, and social engagement with less difficulty.    Baseline  6/10 and 10/10 with prolonged walking (02/02/2019); 7-8/10 (03/17/2019); 8/10 (04/08/2019);    Time  12    Period  Weeks    Status  Partially Met    Target Date  07/01/19      PT LONG TERM GOAL #4   Title  Improved 6 minute walk test by 10% indicating improved LEs functional mobility and strength.    Baseline  Deferred to later session (02/02/2019); 330 ft (02/04/2019); 392 ft (03/17/2019); 434 ft (04/08/2019);    Time  12    Period  Weeks    Status  Achieved    Target Date  07/01/19      PT LONG TERM GOAL #5   Title  Patient will demonstrated 5 time sit to stand with BUEs support within 20s to reduce fall risks and improve LE functional strength.    Baseline  43s with BUEs (from 18.5inch chair) (02/02/2019); 27 s from 18 inch tall chair intermittant UE support (04/08/2019);    Time  12    Period  Weeks    Status  Partially Met    Target Date  07/01/19      Additional Long Term Goals   Additional Long Term Goals  Yes      PT LONG TERM GOAL #6   Title  Improved 6 minute walk test to equal or greater than 550 feet indicating improved LEs functional mobility and strength.    Baseline  434 ft (04/08/2019);    Time  12    Period  Weeks    Status  New    Target Date  07/01/19            Plan - 05/14/19 1846    Clinical Impression Statement  Patient tolerated treatment well overall and reported reduction in back pain from 8/10 to 6/10 when walking by end of session. Appeared to benefit from prone positioning and manual to back. Also reports reduction in knee pain by end of session. Continues to have difficulty in walking due to knee and back pain that limits her  function. Patient would benefit from continued management of limiting condition by skilled physical therapist to address remaining impairments and functional limitations to work towards stated goals and return to PLOF or maximal functional independence.    Personal Factors and Comorbidities  Comorbidity 3+    Comorbidities  anxiety, COPD, cerebral palsy, depression, diabetes mellitus, multiple knee surgeries    Examination-Activity Limitations  Bathing;Continence;Stairs;Stand;Dressing;Bend;Sit;Toileting;Squat;Locomotion Level;Transfers;Lift;Caring for Others    Examination-Participation Restrictions  Driving;Yard Work;Cleaning;Community Activity;Meal Prep;Laundry    Stability/Clinical Decision Making  Evolving/Moderate complexity    Rehab Potential  Fair    PT Frequency  2x / week    PT Duration  12 weeks    PT Treatment/Interventions  ADLs/Self Care Home Management;Cryotherapy;Electrical Stimulation;Iontophoresis 37m/ml Dexamethasone;Gait training;Stair training;Functional mobility training;Therapeutic activities;Therapeutic exercise;Balance training;Neuromuscular re-education;Patient/family education;Orthotic Fit/Training;Manual techniques;Scar mobilization;Dry needling;Energy conservation;Joint Manipulations;Other (comment)    PT Next Visit Plan  pain control, strengthening, stretching    PT Home Exercise Plan  Medbridge: [email or web address]    Consulted and Agree with Plan of Care  Patient       Patient will benefit from skilled therapeutic intervention in order to improve the following deficits and impairments:  Impaired UE functional use, Impaired perceived functional ability, Decreased strength, Increased muscle spasms, Pain, Abnormal gait, Decreased activity tolerance, Decreased endurance, Decreased balance, Decreased coordination, Difficulty walking, Decreased mobility  Visit Diagnosis: Chronic pain of right knee  Muscle weakness (generalized)  Difficulty in walking, not elsewhere  classified     Problem List Patient Active Problem List   Diagnosis Date Noted  . MDD (major depressive disorder), recurrent episode, mild ([place] 12/29/2018  . Insomnia due to mental condition 12/29/2018  . Disorder of vein 03/04/2018  . Lumbar sprain 03/04/2018  . Muscle weakness 03/04/2018  . Neck sprain 03/04/2018  . 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  . Postmenopausal bleeding 02/18/2018  . Impingement syndrome of shoulder region 02/04/2018  . Chest pain with moderate risk for cardiac etiology 05/19/2017  . History of depression 04/15/2017  . GAD (generalized anxiety disorder) 04/15/2017  . Chronic daily headache 04/15/2017  . Panic attack 04/15/2017  . Nocturnal enuresis 04/15/2017  . Mild cognitive impairment 04/15/2017  . Loss of memory 01/14/2017  . Pain medication agreement signed 01/08/2017  . Headache disorder 12/04/2016  . Chronic, continuous use of opioids 07/01/2015  . Vertigo 07/01/2015  . Chronic midline low back pain without sciatica 03/21/2015  . Acute renal failure (ARF) ([place] 02/19/2015  . Type 2 diabetes mellitus without complication ([place] 021/82/8833 . Acute pancreatitis 09/04/2014  . Cerebral palsy ([place] 01/05/2014  . 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 Hip pain, chronic 04/28/2013  . Essential hypertension 01/10/2013  . Irritable colon 01/10/2013  . Noninfectious gastroenteritis and colitis 01/10/2013  . Encounter for long-term (current) use of other medications 12/04/2012  . Chronic GERD 08/12/2012  . Epigastric pain 08/12/2012  . Diarrhea 08/12/2012  . Primary localized osteoarthrosis, lower leg 05/07/2012  . Difficulty walking 02/06/2012    Traci Davis PT, DPT 05/14/19, 6:49 PM  [facility] [address] [place] [address]Phone: [phone]  Fax:  [phone] Name: Traci BELLSMRN: [id number] of Birth: [birth year]

## 2019-05-19 ENCOUNTER — Encounter: Payer: Medicare Other | Admitting: Physical Therapy

## 2019-05-20 ENCOUNTER — Ambulatory Visit: Payer: Medicare Other | Admitting: Physical Therapy

## 2019-05-21 ENCOUNTER — Telehealth: Payer: Self-pay | Admitting: Physical Therapy

## 2019-05-21 NOTE — Telephone Encounter (Signed)
Called patient back at her request. Patient states she saw her PCP who further examined the place in her back that feels extraordinarily tender to touch. She state her doctor did an internal examination and thought she may need pelvic floor therapy and wanted to know if we offer that. I explained that we have pelvic floor therapy specialists that she could be set up with; all we would need is a referral for pelvic floor therapy from a physician. She said her PCP ordered radiographs to find out what is going on there first, but may refer her to PFT in the future. Patient state she would like to hold physical therapy that she has been doing for now until they find out what is going on with her back and knee. I agreed to take her off the schedule until she gets back to [place]. I also answered her questions about pelvic floor physical therapy.   Updated front office.   Everlean Alstrom. Graylon Good, PT, DPT 05/21/19, 6:42 PM

## 2019-05-26 ENCOUNTER — Ambulatory Visit: Payer: Medicare Other | Admitting: Physical Therapy

## 2019-05-28 ENCOUNTER — Encounter: Payer: Self-pay | Admitting: Psychiatry

## 2019-05-28 ENCOUNTER — Ambulatory Visit (INDEPENDENT_AMBULATORY_CARE_PROVIDER_SITE_OTHER): Payer: Medicare Other | Admitting: Psychiatry

## 2019-05-28 ENCOUNTER — Other Ambulatory Visit: Payer: Self-pay

## 2019-05-28 DIAGNOSIS — F411 Generalized anxiety disorder: Secondary | ICD-10-CM | POA: Diagnosis not present

## 2019-05-28 DIAGNOSIS — F3342 Major depressive disorder, recurrent, in full remission: Secondary | ICD-10-CM

## 2019-05-28 DIAGNOSIS — F330 Major depressive disorder, recurrent, mild: Secondary | ICD-10-CM

## 2019-05-28 DIAGNOSIS — F5105 Insomnia due to other mental disorder: Secondary | ICD-10-CM | POA: Diagnosis not present

## 2019-05-28 MED ORDER — BUPROPION HCL 100 MG PO TABS: 100.0000 mg | ORAL_TABLET | Freq: Every morning | ORAL | 1 refills | Status: DC

## 2019-05-28 NOTE — Progress Notes (Signed)
Provider Location : [facility] Patient Location : Home   Virtual Visit via Telephone Note  I connected with Traci Davis on 05/28/19 at  4:00 PM EST by telephone and verified that I am speaking with the correct person using two identifiers.   I discussed the limitations, risks, security and privacy concerns of performing an evaluation and management service by telephone and the availability of in person appointments. I also discussed with the patient that there may be a patient responsible charge related to this service. The patient expressed understanding and agreed to proceed.      I discussed the assessment and treatment plan with the patient. The patient was provided an opportunity to ask questions and all were answered. The patient agreed with the plan and demonstrated an understanding of the instructions.   The patient was advised to call back or seek an in-person evaluation if the symptoms worsen or if the condition fails to improve as anticipated.  [place] [place] MD OP Progress Note  05/28/2019 5:11 PM Traci Davis  MRN:  [id number]  Chief Complaint:  Chief Complaint    Follow-up     HPI: Traci Davis is a 49 year old Caucasian female, lives in [place], [place], has a history of depression, GAD, insomnia, cerebral palsy, diabetes mellitus was evaluated by phone today.  Patient preferred to do a phone call.  Patient today reports she is currently struggling with a lot of back pain.  She reports she has upcoming appointment for imaging on Monday to evaluate for what is going on.  She reports her mood symptoms are stable.  She denies any depression.  She is anxious about her health otherwise is doing okay.  She reports sleep is good.  She reports she has been noncompliant with her Wellbutrin since she got confused about her medications.  She however reports she has a nurse who is going to help her getting everything straightened out.  Patient denies any suicidality, homicidality or perceptual  disturbances.  Patient denies any other concerns today. Visit Diagnosis:    ICD-10-CM   1. MDD (major depressive disorder), recurrent, in full remission ([place])  F33.42   2. GAD (generalized anxiety disorder)  F41.1   3. Insomnia due to mental condition  F51.05   4. MDD (major depressive disorder), recurrent episode, mild ([facility])  F33.0 buPROPion (WELLBUTRIN) 100 MG tablet    Past Psychiatric History: I have reviewed past psychiatric history from my progress note on 09/12/2017.  Past trials of Xanax, Klonopin, Cymbalta, Rozerem-costly.  Past Medical History:  Past Medical History:  Diagnosis Date  . Anxiety   . Arthritis   . Asthma   . Cerebral palsy ([place])   . Complication of anesthesia    panic attacks before surgery  . COPD (chronic obstructive pulmonary disease) ([place])   . Depression   . Diabetes mellitus without complication ([place])   . GERD (gastroesophageal reflux disease)   . Hypertension     Past Surgical History:  Procedure Laterality Date  . CESAREAN SECTION  1995  . CHOLECYSTECTOMY    . DILATATION & CURETTAGE/HYSTEROSCOPY WITH MYOSURE N/A 02/20/2018   Procedure: DILATATION & CURETTAGE/HYSTEROSCOPY WITH MYOSURE ENDOMETRIAL POLYPECTOMY;  Surgeon: Will Bonnet, MD;  Location: [facility] ORS;  Service: Gynecology;  Laterality: N/A;  . ESOPHAGOGASTRODUODENOSCOPY (EGD) WITH PROPOFOL N/A 01/01/2019   Procedure: ESOPHAGOGASTRODUODENOSCOPY (EGD) WITH PROPOFOL;  Surgeon: Lin Landsman, MD;  Location: [facility] ENDOSCOPY;  Service: Gastroenterology;  Laterality: N/A;  . FOOT CAPSULE RELEASE W/ PERCUTANEOUS HEEL CORD LENGTHENING, TIBIAL  TENDON TRANSFER     age 57 ,and 2010-both legs  . JOINT REPLACEMENT Right    both knees partial replacements dates unknown  . knee replacment     x 5 per pt. last one- left knee 2014. has had 2 on R 3 on L  . TYMPANOPLASTY WITH GRAFT Right 01/08/2018   Procedure: TYMPANOPLASTY WITH POSSIBLE OSSICULAR CHAIN RECONSTRUCTION;  Surgeon: [name], MD;   Location: [facility] ORS;  Service: ENT;  Laterality: Right;    Family Psychiatric History: I have reviewed family psychiatric history from my progress note on 09/12/2017.  Family History:  Family History  Problem Relation Age of Onset  . CAD Father   . Heart attack Brother   . Sexual abuse Paternal Grandfather   . Breast cancer Neg Hx     Social History: I have reviewed social history from my progress note on 09/12/2017. Social History   Socioeconomic History  . Marital status: Single    Spouse name: Not on file  . Number of children: Not on file  . Years of education: Not on file  . Highest education level: Not on file  Occupational History  . Not on file  Tobacco Use  . Smoking status: Never Smoker  . Smokeless tobacco: Never Used  Substance and Sexual Activity  . Alcohol use: No  . Drug use: No  . Sexual activity: Yes    Partners: Male    Birth control/protection: Condom  Other Topics Concern  . Not on file  Social History Narrative  . Not on file   Social Determinants of Health   Financial Resource Strain:   . Difficulty of Paying Living Expenses: Not on file  Food Insecurity:   . Worried About Charity fundraiser in the Last Year: Not on file  . Ran Out of Food in the Last Year: Not on file  Transportation Needs:   . Lack of Transportation (Medical): Not on file  . Lack of Transportation (Non-Medical): Not on file  Physical Activity:   . Days of Exercise per Week: Not on file  . Minutes of Exercise per Session: Not on file  Stress:   . Feeling of Stress : Not on file  Social Connections:   . Frequency of Communication with Friends and Family: Not on file  . Frequency of Social Gatherings with Friends and Family: Not on file  . Attends Religious Services: Not on file  . Active Member of Clubs or Organizations: Not on file  . Attends Archivist Meetings: Not on file  . Marital Status: Not on file    Allergies:  Allergies  Allergen Reactions  .  Meloxicam Other (See Comments)    Damage kidney  . Morphine And Related Other (See Comments)    hallucinations  . Vantin [Cefpodoxime] Nausea And Vomiting  . Latex Itching  . Baclofen Other (See Comments)    "makes cerebral palsy do adverse reaction on me", tightens muscles   . Ciprofloxacin Itching and Nausea And Vomiting  . Tape Rash    skin tears.  Paper tape is ok    Metabolic Disorder Labs: Lab Results  Component Value Date   HGBA1C 6.1 (H) 02/19/2015   No results found for: PROLACTIN No results found for: CHOL, TRIG, HDL, CHOLHDL, VLDL, LDLCALC Lab Results  Component Value Date   TSH 2.198 07/10/2016   TSH 2.891 02/19/2015    Therapeutic Level Labs: No results found for: LITHIUM No results found for: VALPROATE No components  found for:  CBMZ  Current Medications: Current Outpatient Medications  Medication Sig Dispense Refill  . acetaminophen (TYLENOL) 325 MG tablet Take 2 tablets (650 mg total) by mouth every 6 (six) hours as needed. (Patient not taking: Reported on 12/11/2018) 60 tablet 0  . albuterol (PROVENTIL HFA;VENTOLIN HFA) 108 (90 BASE) MCG/ACT inhaler Inhale 2 puffs into the lungs 4 (four) times daily as needed. For shortness of breath and/or wheezing    . AMITIZA 24 MCG capsule     . ANORO ELLIPTA 62.5-25 MCG/INH AEPB     . atorvastatin (LIPITOR) 10 MG tablet     . atorvastatin (LIPITOR) 20 MG tablet Take 20 mg by mouth daily.    . Azelastine-Fluticasone 137-50 MCG/ACT SUSP Dymista 137 mcg-50 mcg/spray nasal spray    . Blood Glucose Monitoring Suppl (GLUCOCOM BLOOD GLUCOSE MONITOR) DEVI Test daily before all meals/snacks and once before bedtime.    Marland Kitchen buPROPion (WELLBUTRIN) 100 MG tablet Take 1 tablet (100 mg total) by mouth every morning. 90 tablet 1  . Chlorzoxazone 750 MG TABS Take by mouth.    . Cholecalciferol (VITAMIN D3) 5000 units CAPS Take 5,000 Units by mouth daily.    Marland Kitchen CIPRODEX OTIC suspension     . clarithromycin (BIAXIN) 500 MG tablet     .  clonazePAM (KLONOPIN) 0.5 MG tablet Take 0.5 mg by mouth 2 (two) times daily as needed for anxiety.    Marland Kitchen CREON 36000 units CPEP capsule     . diclofenac sodium (VOLTAREN) 1 % GEL Apply 1 application topically as needed (apply to painful areas of chest wall). 100 g 0  . dicyclomine (BENTYL) 10 MG capsule     . donepezil (ARICEPT) 10 MG tablet Take 10 mg by mouth at bedtime.     Marland Kitchen FLUoxetine (PROZAC) 20 MG capsule Take 1 capsule (20 mg total) by mouth daily. To be combined with 40 mg 90 capsule 0  . FLUoxetine (PROZAC) 40 MG capsule Take 1 capsule (40 mg total) by mouth daily. To be combined with 20 mg 90 capsule 0  . furosemide (LASIX) 40 MG tablet Take 40 mg by mouth daily as needed for fluid.     Marland Kitchen glimepiride (AMARYL) 4 MG tablet Take 4 mg by mouth daily with breakfast.    . glucose blood (ONE TOUCH ULTRA TEST) test strip USE TO TEST BLOOD SUGAR TWO TIMES A DAY    . glucose blood test strip USE TO TEST BLOOD SUGAR TWO TIMES A DAY    . hydrOXYzine (VISTARIL) 50 MG capsule Take 1-2 capsules (50-100 mg total) by mouth daily as needed. For severe panic attacks only 180 capsule 0  . incobotulinumtoxinA (XEOMIN) 100 units SOLR injection Inject 100 Units into the muscle every 3 (three) months.     . Lancets (FREESTYLE) lancets Test daily before all meals/snacks    . lidocaine (LIDODERM) 5 %     . lisinopril (PRINIVIL,ZESTRIL) 20 MG tablet Take 20 mg by mouth daily.    . mupirocin ointment (BACTROBAN) 2 % mupirocin 2 % topical ointment    . nitrofurantoin (MACRODANTIN) 100 MG capsule     . nystatin (MYCOSTATIN/NYSTOP) powder Apply 1 g topically daily as needed (rash).     Marland Kitchen omeprazole (PRILOSEC) 40 MG capsule Take 40 mg by mouth daily.     Marland Kitchen oxyCODONE (OXY IR/ROXICODONE) 5 MG immediate release tablet     . oxyCODONE-acetaminophen (PERCOCET/ROXICET) 5-325 MG tablet oxycodone-acetaminophen 5 mg-325 mg tablet    . pioglitazone (ACTOS)  45 MG tablet Take 45 mg by mouth daily.    . polyethylene glycol  (MIRALAX / GLYCOLAX) packet Take 17 gm mixed in liqiud as needed for constipation    . pregabalin (LYRICA) 25 MG capsule Take 50 mg by mouth at bedtime.    . solifenacin (VESICARE) 10 MG tablet Take 10 mg by mouth daily.    . Tiotropium Bromide Monohydrate (SPIRIVA RESPIMAT) 1.25 MCG/ACT AERS Inhale 1 puff into the lungs daily.    . traZODone (DESYREL) 100 MG tablet Take 1 tablet (100 mg total) by mouth at bedtime. 90 tablet 1   No current facility-administered medications for this visit.     Musculoskeletal: Strength & Muscle Tone: UTA Gait & Station: Is in a wheelchair Patient leans: N/A  Psychiatric Specialty Exam: Review of Systems  Musculoskeletal: Positive for back pain.  Psychiatric/Behavioral: Negative for agitation, behavioral problems, confusion, decreased concentration, dysphoric mood, hallucinations, self-injury, sleep disturbance and suicidal ideas. The patient is not nervous/anxious and is not hyperactive.   All other systems reviewed and are negative.   Last menstrual period 08/20/2014.There is no height or weight on file to calculate BMI.  General Appearance: UTA  Eye Contact:  UTA  Speech:  Normal Rate  Volume:  Normal  Mood:  Euthymic  Affect:  UTA  Thought Process:  Goal Directed and Descriptions of Associations: Intact  Orientation:  Full (Time, Place, and Person)  Thought Content: Logical   Suicidal Thoughts:  No  Homicidal Thoughts:  No  Memory:  Immediate;   Fair Recent;   Fair Remote;   Fair  Judgement:  Fair  Insight:  Fair  Psychomotor Activity:  UTA  Concentration:  Concentration: Fair and Attention Span: Fair  Recall:  [employer] of Knowledge: Fair  Language: Fair  Akathisia:  No  Handed:  Right  AIMS (if indicated): UTA  Assets:  Communication Skills Desire for Improvement Housing Social Support  ADL's:  Intact  Cognition: WNL  Sleep:  Fair   Screenings:   Assessment and Plan: Tywanda is a 49 year old Caucasian female who has a  history of depression, chronic pain, diabetes melitis, MCI, cerebral palsy was evaluated by phone today.  Patient is currently stable on medication regimen.  Plan as noted below.  Plan Depression-stable Prozac 40 mg p.o. daily Wellbutrin 100 mg p.o. daily in the morning.  Encouraged compliance  GAD-stable Prozac as prescribed Hydroxyzine as needed.  She rarely uses it.  Insomnia-stable Trazodone 100 mg p.o. nightly   Follow-up in clinic in 3 months or sooner if needed.  April 14 at 4 PM  I have spent atleast 20 minutes non face to face with patient today. More than 50 % of the time was spent for  ordering medications and test ,psychoeducation and supportive psychotherapy and care coordination,as well as documenting clinical information in electronic health record. This note was generated in part or whole with voice recognition software. Voice recognition is usually quite accurate but there are transcription errors that can and very often do occur. I apologize for any typographical errors that were not detected and corrected.      Ursula Alert, MD 05/28/2019, 5:11 PM

## 2019-07-08 DIAGNOSIS — R569 Unspecified convulsions: Secondary | ICD-10-CM | POA: Insufficient documentation

## 2019-07-22 ENCOUNTER — Encounter: Payer: Self-pay | Admitting: Physical Therapy

## 2019-07-22 DIAGNOSIS — R262 Difficulty in walking, not elsewhere classified: Secondary | ICD-10-CM

## 2019-07-22 DIAGNOSIS — G8929 Other chronic pain: Secondary | ICD-10-CM

## 2019-07-22 DIAGNOSIS — M6281 Muscle weakness (generalized): Secondary | ICD-10-CM

## 2019-07-22 NOTE — Therapy (Signed)
[facility] [address], [place], [address] Phone: [phone]   Fax:  [phone]  Physical Therapy No-Visit Discharge Summary Reporting period: 02/02/2019 - 07/22/2019   Patient Details  Name: Traci Davis MRN: [id number] Date of Birth: 1970/08/17 Referring Provider (PT): Gearldine Shown, [place]   Encounter Date: 07/22/2019    Past Medical History:  Diagnosis Date  . Anxiety   . Arthritis   . Asthma   . Cerebral palsy ([place])   . Complication of anesthesia    panic attacks before surgery  . COPD (chronic obstructive pulmonary disease) ([place])   . Depression   . Diabetes mellitus without complication ([place])   . GERD (gastroesophageal reflux disease)   . Hypertension     Past Surgical History:  Procedure Laterality Date  . CESAREAN SECTION  1995  . CHOLECYSTECTOMY    . DILATATION & CURETTAGE/HYSTEROSCOPY WITH MYOSURE N/A 02/20/2018   Procedure: DILATATION & CURETTAGE/HYSTEROSCOPY WITH MYOSURE ENDOMETRIAL POLYPECTOMY;  Surgeon: Will Bonnet, MD;  Location: [facility] ORS;  Service: Gynecology;  Laterality: N/A;  . ESOPHAGOGASTRODUODENOSCOPY (EGD) WITH PROPOFOL N/A 01/01/2019   Procedure: ESOPHAGOGASTRODUODENOSCOPY (EGD) WITH PROPOFOL;  Surgeon: Lin Landsman, MD;  Location[facility] ENDOSCOPY;  Service: Gastroenterology;  Laterality: N/A;  . FOOT CAPSULE RELEASE W/ PERCUTANEOUS HEEL CORD LENGTHENING, TIBIAL TENDON TRANSFER     age 50 ,and 2010-both legs  . JOINT REPLACEMENT Right    both knees partial replacements dates unknown  . knee replacment     x 5 per pt. last one- left knee 2014. has had 2 on R 3 on L  . TYMPANOPLASTY WITH GRAFT Right 01/08/2018   Procedure: TYMPANOPLASTY WITH POSSIBLE OSSICULAR CHAIN RECONSTRUCTION;  Surgeon: [name], MD;  Location: [facility] ORS;  Service: ENT;  Laterality: Right;    There were no vitals filed for this visit.  Subjective Assessment - 07/22/19 1259     Subjective  Patient requested to hold physical therapy until she was able to be further evaluated by her medical team. She has not returned for further PT since her last visit 3/66/2947 and her certification period has expired. She will now be discharged due to lack of participation.    Pertinent History  Patient is a 49 y.o. female who presents to outpatient physical therapy with a referral for medical diagnosis of R osteoarthritis knee pain. This patient's chief complaints consist of constant pain R knee that has been gradually increasing in severity since many years ago, leading to the following functional deficits: difficulty with housekeeping activities, prolonged sitting, quality of life, lifting, etc.. Relevant past medical history and comorbidities include anxiety, COPD, cerebral palsy, depression, diabetes mellitus; surgeries include: bilateral partial knee replacement, foot capsule release with percutaneous heel cord lengthening,    Limitations  Sitting;Lifting;Standing;Walking;House hold activities    How long can you sit comfortably?  45 minutes    How long can you stand comfortably?  5 minutes    How long can you walk comfortably?  25 minutes    Diagnostic tests  X-rays    Patient Stated Goals  To get up and walking    Pain Onset  More than a month ago      OBJECTIVE Patient is not present for examination at this time. Please see previous documentation for latest objective data.      PT Short Term Goals - 03/02/19 1912      PT SHORT TERM GOAL #1   Title  Be  independent with initial home exercise program for self-management of symptoms    Baseline  initial HEP provided at IE (02/02/2019);    Time  3    Period  Weeks    Status  Achieved    Target Date  02/23/19        PT Long Term Goals - 07/22/19 1302      PT LONG TERM GOAL #1   Title  Be independent with a long-term home exercise program for self-management of symptoms.    Baseline  initial HEP provided at IE  (02/02/2019); currently participating but has not received long term HEP (04/08/2019);    Time  12    Period  Weeks    Status  Partially Met    Target Date  07/01/19      PT LONG TERM GOAL #2   Title  Demonstrate improved FOTO score by 10 units to demonstrate improvement in overall condition and self-reported functional ability.    Baseline  FOTO = 33(02/02/2019); FOTO = 52 (04/08/2019);    Time  12    Period  Weeks    Status  Partially Met    Target Date  07/01/19      PT LONG TERM GOAL #3   Title  Reduce pain with functional activities to equal or less than 1/10 to allow patient to complete usual activities including ADLs, IADLs, and social engagement with less difficulty.    Baseline  6/10 and 10/10 with prolonged walking (02/02/2019); 7-8/10 (03/17/2019); 8/10 (04/08/2019);    Time  12    Period  Weeks    Status  Partially Met    Target Date  07/01/19      PT LONG TERM GOAL #4   Title  Improved 6 minute walk test by 10% indicating improved LEs functional mobility and strength.    Baseline  Deferred to later session (02/02/2019); 330 ft (02/04/2019); 392 ft (03/17/2019); 434 ft (04/08/2019);    Time  12    Period  Weeks    Status  Achieved    Target Date  07/01/19      PT LONG TERM GOAL #5   Title  Patient will demonstrated 5 time sit to stand with BUEs support within 20s to reduce fall risks and improve LE functional strength.    Baseline  43s with BUEs (from 18.5inch chair) (02/02/2019); 27 s from 18 inch tall chair intermittant UE support (04/08/2019);    Time  12    Period  Weeks    Status  Partially Met    Target Date  07/01/19      PT LONG TERM GOAL #6   Title  Improved 6 minute walk test to equal or greater than 550 feet indicating improved LEs functional mobility and strength.    Baseline  434 ft (04/08/2019);    Time  12    Period  Weeks    Status  Not Met    Target Date  07/01/19        Plan - 07/22/19 1309    Clinical Impression Statement  Patient has attended  14 physical therapy treatment sessions over the course of her care. She made progress towards most goals but continued to be limited by back and knee pain. She requested to hold physical therapy after her last visit on 05/14/2019 until she could be further evaluated by the medical team. She has not returned since then and is now being discharged due to lack of participation. During her episode  of care she made progress towards participating in an appropriate HEP for her ability and at last progress note objectively showing improvements in FOTO score (self-reported function), ambulation distance during 6MWT (endurance, activity tolerance), and 5TSTS test (LE strength, power, balance). Subjectively, she reported improved ability to walk and stand longer and improved balance.    Personal Factors and Comorbidities  Comorbidity 3+    Comorbidities  anxiety, COPD, cerebral palsy, depression, diabetes mellitus, multiple knee surgeries    Examination-Activity Limitations  Bathing;Continence;Stairs;Stand;Dressing;Bend;Sit;Toileting;Squat;Locomotion Level;Transfers;Lift;Caring for Others    Examination-Participation Restrictions  Driving;Yard Work;Cleaning;Community Activity;Meal Prep;Laundry    Stability/Clinical Decision Making  Evolving/Moderate complexity    Rehab Potential  Fair    PT Frequency  2x / week    PT Duration  12 weeks    PT Treatment/Interventions  ADLs/Self Care Home Management;Cryotherapy;Electrical Stimulation;Iontophoresis 60m/ml Dexamethasone;Gait training;Stair training;Functional mobility training;Therapeutic activities;Therapeutic exercise;Balance training;Neuromuscular re-education;Patient/family education;Orthotic Fit/Training;Manual techniques;Scar mobilization;Dry needling;Energy conservation;Joint Manipulations;Other (comment)    PT Next Visit Plan  Patient is now discharged due to lack of participatin after requesting to discontinue care until she had further medical evaluation for low  back and knee pain.    PT Home Exercise Plan  [employer]: [email or web address]    Consulted and Agree with Plan of Care  Patient       Patient will benefit from skilled therapeutic intervention in order to improve the following deficits and impairments:  Impaired UE functional use, Impaired perceived functional ability, Decreased strength, Increased muscle spasms, Pain, Abnormal gait, Decreased activity tolerance, Decreased endurance, Decreased balance, Decreased coordination, Difficulty walking, Decreased mobility  Visit Diagnosis: Chronic pain of right knee  Muscle weakness (generalized)  Difficulty in walking, not elsewhere classified     Problem List Patient Active Problem List   Diagnosis Date Noted  . MDD (major depressive disorder), recurrent episode, mild ([place] 12/29/2018  . Insomnia due to mental condition 12/29/2018  . Disorder of vein 03/04/2018  . Lumbar sprain 03/04/2018  . Muscle weakness 03/04/2018  . Neck sprain 03/04/2018  . 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  . Postmenopausal bleeding 02/18/2018  . Impingement syndrome of shoulder region 02/04/2018  . Chest pain with moderate risk for cardiac etiology 05/19/2017  . History of depression 04/15/2017  . GAD (generalized anxiety disorder) 04/15/2017  . Chronic daily headache 04/15/2017  . Panic attack 04/15/2017  . Nocturnal enuresis 04/15/2017  . Mild cognitive impairment 04/15/2017  . Loss of memory 01/14/2017  . Pain medication agreement signed 01/08/2017  . Headache disorder 12/04/2016  . Chronic, continuous use of opioids 07/01/2015  . Vertigo 07/01/2015  . Chronic midline low back pain without sciatica 03/21/2015  . Acute renal failure (ARF) ([place] 02/19/2015  . Type 2 diabetes mellitus without complication ([place] 067/34/1937 . Acute pancreatitis 09/04/2014  . Cerebral palsy ([place] 01/05/2014  . 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 Hip pain, chronic 04/28/2013  . Essential hypertension 01/10/2013   . Irritable colon 01/10/2013  . Noninfectious gastroenteritis and colitis 01/10/2013  . Encounter for long-term (current) use of other medications 12/04/2012  . Chronic GERD 08/12/2012  . Epigastric pain 08/12/2012  . Diarrhea 08/12/2012  . Primary localized osteoarthrosis, lower leg 05/07/2012  . Difficulty walking 02/06/2012    SEverlean Alstrom SGraylon Good PT, DPT 07/22/19, 1:10 PM   [facility] [address] [place] [address]Phone: [phone]  Fax:  [phone] Name: Traci LIPSKYMRN: [id number] of Birth: [birth year]

## 2019-07-29 ENCOUNTER — Ambulatory Visit: Payer: Medicare Other | Attending: Neurology

## 2019-07-29 DIAGNOSIS — Z6836 Body mass index (BMI) 36.0-36.9, adult: Secondary | ICD-10-CM | POA: Diagnosis not present

## 2019-07-29 DIAGNOSIS — R251 Tremor, unspecified: Secondary | ICD-10-CM | POA: Diagnosis not present

## 2019-07-29 DIAGNOSIS — G4730 Sleep apnea, unspecified: Secondary | ICD-10-CM | POA: Diagnosis not present

## 2019-07-30 ENCOUNTER — Other Ambulatory Visit: Payer: Self-pay

## 2019-08-04 ENCOUNTER — Other Ambulatory Visit: Payer: Self-pay

## 2019-08-04 ENCOUNTER — Ambulatory Visit (INDEPENDENT_AMBULATORY_CARE_PROVIDER_SITE_OTHER): Payer: Medicare Other | Admitting: Cardiovascular Disease

## 2019-08-04 ENCOUNTER — Ambulatory Visit (INDEPENDENT_AMBULATORY_CARE_PROVIDER_SITE_OTHER): Payer: Medicare Other

## 2019-08-04 ENCOUNTER — Encounter: Payer: Self-pay | Admitting: Cardiovascular Disease

## 2019-08-04 VITALS — BP 110/69 | HR 61 | Ht 62.0 in | Wt 211.1 lb

## 2019-08-04 DIAGNOSIS — R55 Syncope and collapse: Secondary | ICD-10-CM

## 2019-08-04 NOTE — Patient Instructions (Addendum)
Medication Instructions:  No changes  If you need a refill on your cardiac medications before your next appointment, please call your pharmacy.    Lab work: No new labs needed   If you have labs (blood work) drawn today and your tests are completely normal, you will receive your results only by: Marland Kitchen MyChart Message (if you have MyChart) OR . A paper copy in the mail If you have any lab test that is abnormal or we need to change your treatment, we will call you to review the results.   Testing/Procedures:  Echo for syncope Zio monitor for syncope Your physician has requested that you have an echocardiogram. Echocardiography is a painless test that uses sound waves to create images of your heart. It provides your doctor with information about the size and shape of your heart and how well your heart's chambers and valves are working. This procedure takes approximately one hour. There are no restrictions for this procedure.  Your physician has recommended that you wear a Zio monitor. This monitor is a medical device that records the heart's electrical activity. Doctors most often use these monitors to diagnose arrhythmias. Arrhythmias are problems with the speed or rhythm of the heartbeat. The monitor is a small device applied to your chest. You can wear one while you do your normal daily activities. While wearing this monitor if you have any symptoms to push the button and record what you felt. Once you have worn this monitor for the period of time provider prescribed (Usually 14 days), you will return the monitor device in the postage paid box. Once it is returned they will download the data collected and provide [place] with a report which the provider will then review and we will call you with those results. Important tips:  1. Avoid showering during the first 24 hours of wearing the monitor. 2. Avoid excessive sweating to help maximize wear time. 3. Do not submerge the device, no hot tubs, and no  swimming pools. 4. Keep any lotions or oils away from the patch. 5. After 24 hours you may shower with the patch on. Take brief showers with your back facing the shower head.  6. Do not remove patch once it has been placed because that will interrupt data and decrease adhesive wear time. 7. Push the button when you have any symptoms and write down what you were feeling. 8. Once you have completed wearing your monitor, remove and place into box which has postage paid and place in your outgoing mailbox.  9. If for some reason you have misplaced your box then call our office and we can provide another box and/or mail it off for you.         Follow-Up: At [facility], you and your health needs are our priority.  As part of our continuing mission to provide you with exceptional heart care, we have created designated Provider Care Teams.  These Care Teams include your primary Cardiologist (physician) and Advanced Practice Providers (APPs -  Physician Assistants and Nurse Practitioners) who all work together to provide you with the care you need, when you need it.  . You will need a follow up appointment as needed  . Providers on your designated Care Team:   . Murray Hodgkins, NP . Christell Faith, PA-C . Marrianne Mood, PA-C  Any Other Special Instructions Will Be Listed Below (If Applicable).  For educational health videos Log in to : [email or web address] Or : [email or web address], password : [email or web address]

## 2019-08-04 NOTE — Progress Notes (Signed)
Cardiology Office Note  Date:  08/04/2019   ID:  Traci Davis, DOB 1971/04/12, MRN [id number]  PCP:  Remi Haggard, FNP   Chief Complaint  Patient presents with  . New Patient (Initial Visit)    Syncope; Meds verbally reviewed with patient.    HPI:  Traci Davis is a 49 year old woman with past medical history of GERD/dysphagia Anxiety Memory loss, followed by neurology History of seizures Referred by [facility] neurology, for syncope    Long discussion concerning recent events 07/04/2019, took 3 pain pills/oxycodone for back pain, knee pain Is also taking lisinopril 20 mg at the time 30 to 45 minutes later, slumped over on the dining room table, was very out of it per her boyfriend who presents with her today He had difficulty arousing her, she was finally able to be aroused 20 to 45 seconds later, He called 911, she continued to be confused, he reported blood pressure 80 systolic  Was taken to the emergency room at [facility] There is no mention of incontinence, tongue biting, cheek biting was noted. Patient denies ever waking up confused.  CT head with no notable changes from previous only noting a point cephalic cyst adjacent to the anterior horn of the right lateral ventricle which was stable.   Orthostatics negative in the emergency department.   She has had EEG with outpatient neurology Prior history of seizures 15 years ago  In follow-up she reports having some rare episodes of tachycardia but these seem to happen when she is anxious, has a panic attack  She is on chronic pain medications for Chronic back pain  EKG personally reviewed by myself on todays visit Shows normal sinus rhythm rate 61 bpm no significant ST-T wave changes  No prior echocardiogram available that we could find   PMH:   has a past medical history of Anxiety, Arthritis, Asthma, Cerebral palsy ([place]), Complication of anesthesia, COPD (chronic obstructive pulmonary disease) ([place]),  Depression, Diabetes mellitus without complication ([place]), GERD (gastroesophageal reflux disease), and Hypertension.  PSH:    Past Surgical History:  Procedure Laterality Date  . CESAREAN SECTION  1995  . CHOLECYSTECTOMY    . DILATATION & CURETTAGE/HYSTEROSCOPY WITH MYOSURE N/A 02/20/2018   Procedure: DILATATION & CURETTAGE/HYSTEROSCOPY WITH MYOSURE ENDOMETRIAL POLYPECTOMY;  Surgeon: Will Bonnet, MD;  Location: [facility] ORS;  Service: Gynecology;  Laterality: N/A;  . ESOPHAGOGASTRODUODENOSCOPY (EGD) WITH PROPOFOL N/A 01/01/2019   Procedure: ESOPHAGOGASTRODUODENOSCOPY (EGD) WITH PROPOFOL;  Surgeon: Lin Landsman, MD;  Location: [facility] ENDOSCOPY;  Service: Gastroenterology;  Laterality: N/A;  . FOOT CAPSULE RELEASE W/ PERCUTANEOUS HEEL CORD LENGTHENING, TIBIAL TENDON TRANSFER     age 80 ,and 2010-both legs  . JOINT REPLACEMENT Right    both knees partial replacements dates unknown  . knee replacment     x 5 per pt. last one- left knee 2014. has had 2 on R 3 on L  . TYMPANOPLASTY WITH GRAFT Right 01/08/2018   Procedure: TYMPANOPLASTY WITH POSSIBLE OSSICULAR CHAIN RECONSTRUCTION;  Surgeon: [name], MD;  Location: [facility] ORS;  Service: ENT;  Laterality: Right;    Current Outpatient Medications  Medication Sig Dispense Refill  . albuterol (PROVENTIL HFA;VENTOLIN HFA) 108 (90 BASE) MCG/ACT inhaler Inhale 2 puffs into the lungs 4 (four) times daily as needed. For shortness of breath and/or wheezing    . AMITIZA 24 MCG capsule     . ANORO ELLIPTA 62.5-25 MCG/INH AEPB     . atorvastatin (LIPITOR) 10 MG tablet     .  atorvastatin (LIPITOR) 20 MG tablet Take 20 mg by mouth daily.    . Blood Glucose Monitoring Suppl (GLUCOCOM BLOOD GLUCOSE MONITOR) DEVI Test daily before all meals/snacks and once before bedtime.    Marland Kitchen buPROPion (WELLBUTRIN) 100 MG tablet Take 1 tablet (100 mg total) by mouth every morning. 90 tablet 1  . Chlorzoxazone 750 MG TABS Take by mouth in the morning and at bedtime. 1  1/2 tabs BID    . Cholecalciferol (VITAMIN D3) 5000 units CAPS Take 5,000 Units by mouth daily.    Marland Kitchen CREON 36000 units CPEP capsule     . diclofenac sodium (VOLTAREN) 1 % GEL Apply 1 application topically as needed (apply to painful areas of chest wall). 100 g 0  . dicyclomine (BENTYL) 10 MG capsule Take 10 mg by mouth as needed.     . donepezil (ARICEPT) 10 MG tablet Take 10 mg by mouth at bedtime.     Marland Kitchen FLUoxetine (PROZAC) 20 MG capsule Take 1 capsule (20 mg total) by mouth daily. To be combined with 40 mg 90 capsule 0  . FLUoxetine (PROZAC) 40 MG capsule Take 1 capsule (40 mg total) by mouth daily. To be combined with 20 mg 90 capsule 0  . furosemide (LASIX) 40 MG tablet Take 40 mg by mouth daily as needed for fluid.     Marland Kitchen glimepiride (AMARYL) 4 MG tablet Take 4 mg by mouth daily with breakfast.    . glucose blood (ONE TOUCH ULTRA TEST) test strip USE TO TEST BLOOD SUGAR TWO TIMES A DAY    . glucose blood test strip USE TO TEST BLOOD SUGAR TWO TIMES A DAY    . hydrOXYzine (VISTARIL) 50 MG capsule Take 1-2 capsules (50-100 mg total) by mouth daily as needed. For severe panic attacks only 180 capsule 0  . incobotulinumtoxinA (XEOMIN) 100 units SOLR injection Inject 100 Units into the muscle every 3 (three) months.     . Lancets (FREESTYLE) lancets Test daily before all meals/snacks    . lidocaine (LIDODERM) 5 % Place onto the skin as needed.     Marland Kitchen lisinopril (PRINIVIL,ZESTRIL) 20 MG tablet Take 20 mg by mouth daily.    . mupirocin ointment (BACTROBAN) 2 % as needed.     . nystatin (MYCOSTATIN/NYSTOP) powder Apply 1 g topically daily as needed (rash).     Marland Kitchen omeprazole (PRILOSEC) 40 MG capsule Take 40 mg by mouth daily.     Marland Kitchen oxyCODONE (OXY IR/ROXICODONE) 5 MG immediate release tablet Take 5 mg by mouth every 6 (six) hours as needed.     . pioglitazone (ACTOS) 45 MG tablet Take 45 mg by mouth daily.    . polyethylene glycol (MIRALAX / GLYCOLAX) packet Take 17 gm mixed in liqiud as needed for  constipation    . solifenacin (VESICARE) 10 MG tablet Take 10 mg by mouth daily.    . traZODone (DESYREL) 100 MG tablet Take 1 tablet (100 mg total) by mouth at bedtime. 90 tablet 1  . acetaminophen (TYLENOL) 325 MG tablet Take 2 tablets (650 mg total) by mouth every 6 (six) hours as needed. (Patient not taking: Reported on 12/11/2018) 60 tablet 0  . Azelastine-Fluticasone 137-50 MCG/ACT SUSP Dymista 137 mcg-50 mcg/spray nasal spray    . CIPRODEX OTIC suspension     . clarithromycin (BIAXIN) 500 MG tablet     . clonazePAM (KLONOPIN) 0.5 MG tablet Take 0.5 mg by mouth 2 (two) times daily as needed for anxiety.    Marland Kitchen  nitrofurantoin (MACRODANTIN) 100 MG capsule     . oxyCODONE-acetaminophen (PERCOCET/ROXICET) 5-325 MG tablet oxycodone-acetaminophen 5 mg-325 mg tablet    . pregabalin (LYRICA) 25 MG capsule Take 50 mg by mouth at bedtime.    . Tiotropium Bromide Monohydrate (SPIRIVA RESPIMAT) 1.25 MCG/ACT AERS Inhale 1 puff into the lungs daily.     No current facility-administered medications for this visit.     Allergies:   Meloxicam, Morphine and related, Vantin [cefpodoxime], Latex, Baclofen, Ciprofloxacin, and Tape   Social History:  The patient  reports that she has never smoked. She has never used smokeless tobacco. She reports that she does not drink alcohol or use drugs.   Family History:   family history includes CAD in her father; Heart attack in her brother; Sexual abuse in her paternal grandfather.    Review of Systems: Review of Systems  Constitutional: Negative.   HENT: Negative.   Respiratory: Negative.   Cardiovascular: Negative.   Gastrointestinal: Negative.   Musculoskeletal: Positive for back pain and joint pain.  Neurological: Positive for loss of consciousness.  Psychiatric/Behavioral: Negative.   All other systems reviewed and are negative.   PHYSICAL EXAM: VS:  BP 110/69 (BP Location: Left Arm, Patient Position: Sitting, Cuff Size: Large)   Pulse 61   Ht 5\' 2"   (1.575 m)   Wt 211 lb 2 oz (95.8 kg) Comment: with leg brace  LMP 08/20/2014 Comment: missed cup when trying to obtain urine spec  SpO2 94%   BMI 38.62 kg/m  , BMI Body mass index is 38.62 kg/m. GEN: Well nourished, well developed, in no acute distress HEENT: normal Neck: no JVD, carotid bruits, or masses Cardiac: RRR; no murmurs, rubs, or gallops,no edema  Respiratory:  clear to auscultation bilaterally, normal work of breathing GI: soft, nontender, nondistended, + BS MS: no deformity or atrophy Knee brace in place on the left Skin: warm and dry, no rash Neuro:  Strength and sensation are intact Psych: euthymic mood, full affect  Recent Labs: 10/05/2018: ALT 23; BUN 16; Hemoglobin 12.6; Platelets 263; Potassium 3.5; Sodium 139 12/08/2018: Creatinine, Ser 1.10    Lipid Panel No results found for: CHOL, HDL, LDLCALC, TRIG    Wt Readings from Last 3 Encounters:  08/04/19 211 lb 2 oz (95.8 kg)  03/19/19 210 lb (95.3 kg)  01/01/19 220 lb (99.8 kg)     ASSESSMENT AND PLAN:  Problem List Items Addressed This Visit    None    Visit Diagnoses    Syncope and collapse    -  Primary   Relevant Orders   EKG 12-Lead     Syncope Etiology unclear, I am concerned about her taking 3 pain medications and possibly even her muscle relaxer 30 to 45 minutes prior to the event She reports that she never takes 3, typically only takes 1-2 She was just in such severe pain that she took 3 at that time.  Shortly after with what appeared to the ex-boyfriend as a loss of consciousness, confusion, hypotension.  This potentially could have been caused by the pain medication Since then work-up has been relatively unrevealing.  No preceding events, no events since that time.  Neurologic work-up as far as I can tell has been unrevealing -We have ordered echocardiogram given the event, also unable to exclude arrhythmia, we have ordered a ZIO monitor -Would continue to hold lisinopril, blood pressure is  low on today's visit, the above syncope may have been exacerbated by her lisinopril which she was  taking at the time, 20 mg Recommended she stay hydrated  Chronic pain We recommended she try not to take more than 1-2 of her pain medications, 3 may have been too much Also try not to mix with her muscle relaxer or any benzodiazepines   Total encounter time more than 60 minutes  Greater than 50% was spent in counseling and coordination of care with the patient   Disposition:   F/U as needed We will call her with the results of her echo and ZIO monitor   Signed, Esmond Plants, M.D., Ph.D. Protection, [place]

## 2019-08-12 ENCOUNTER — Encounter: Payer: Self-pay | Admitting: Psychiatry

## 2019-08-12 ENCOUNTER — Ambulatory Visit (INDEPENDENT_AMBULATORY_CARE_PROVIDER_SITE_OTHER): Payer: Medicare Other | Admitting: Psychiatry

## 2019-08-12 ENCOUNTER — Other Ambulatory Visit: Payer: Self-pay

## 2019-08-12 DIAGNOSIS — F5105 Insomnia due to other mental disorder: Secondary | ICD-10-CM

## 2019-08-12 DIAGNOSIS — F411 Generalized anxiety disorder: Secondary | ICD-10-CM

## 2019-08-12 DIAGNOSIS — F3342 Major depressive disorder, recurrent, in full remission: Secondary | ICD-10-CM | POA: Insufficient documentation

## 2019-08-12 DIAGNOSIS — F3341 Major depressive disorder, recurrent, in partial remission: Secondary | ICD-10-CM | POA: Insufficient documentation

## 2019-08-12 MED ORDER — FLUOXETINE HCL 20 MG PO CAPS: 20.0000 mg | ORAL_CAPSULE | Freq: Every day | ORAL | 0 refills | Status: DC

## 2019-08-12 MED ORDER — TRAZODONE HCL 100 MG PO TABS: 150.0000 mg | ORAL_TABLET | Freq: Every day | ORAL | 0 refills | Status: DC

## 2019-08-12 MED ORDER — FLUOXETINE HCL 40 MG PO CAPS: 40.0000 mg | ORAL_CAPSULE | Freq: Every day | ORAL | 0 refills | Status: DC

## 2019-08-12 MED ORDER — HYDROXYZINE PAMOATE 50 MG PO CAPS: 50.0000 mg | ORAL_CAPSULE | Freq: Every day | ORAL | 0 refills | Status: DC | PRN

## 2019-08-12 NOTE — Progress Notes (Signed)
Provider Location : [facility] Patient Location : Home  Virtual Visit via Telephone Note  I connected with Traci Davis on 08/12/19 at  4:00 PM EDT by telephone and verified that I am speaking with the correct person using two identifiers.   I discussed the limitations, risks, security and privacy concerns of performing an evaluation and management service by telephone and the availability of in person appointments. I also discussed with the patient that there may be a patient responsible charge related to this service. The patient expressed understanding and agreed to proceed.     I discussed the assessment and treatment plan with the patient. The patient was provided an opportunity to ask questions and all were answered. The patient agreed with the plan and demonstrated an understanding of the instructions.   The patient was advised to call back or seek an in-person evaluation if the symptoms worsen or if the condition fails to improve as anticipated.   [place] MD OP Progress Note  08/12/2019 4:56 PM Traci Davis  MRN:  [id number]  Chief Complaint:  Chief Complaint    Follow-up     HPI: Traci Davis is a 49 year old Caucasian female, lives in [place], on [place], has a history of depression, GAD, insomnia, cerebral palsy, diabetes melitis was evaluated by phone today.  Patient preferred to do a phone call.  Patient today reports she continues to struggle with severe back pain.  She reports she is currently following up with her providers for the same.  She reports the back pain does make her anxious and sad.  She also feels tired during the day.  The pain does affect her sleep at night.  She tried melatonin however it makes her too groggy when she wakes up in the morning.  She continues to take trazodone.  Discussed readjusting the dosage of trazodone.  Patient reports she is also anxious in social situations.  Her anxiety could also be due to her pain.  Patient reports she is compliant on her  medications as prescribed.  She was not compliant on her Wellbutrin when she was seen last visit.  However today she reports she is compliant on her Wellbutrin also.  She reports she does not want to make any changes with her antidepressants.  Patient denies any suicidality, homicidality or perceptual disturbances.  Patient denies any other concerns today. Visit Diagnosis:    ICD-10-CM   1. MDD (major depressive disorder), recurrent, in full remission ([place])  F33.42   2. GAD (generalized anxiety disorder)  F41.1 FLUoxetine (PROZAC) 20 MG capsule    FLUoxetine (PROZAC) 40 MG capsule    hydrOXYzine (VISTARIL) 50 MG capsule  3. Insomnia due to mental condition  F51.05 traZODone (DESYREL) 100 MG tablet    Past Psychiatric History: I have reviewed past psychiatric history from my progress note on 09/12/2017.  Past trials of Xanax, Klonopin, Cymbalta, Rozerem-costly.  Past Medical History:  Past Medical History:  Diagnosis Date  . Anxiety   . Arthritis   . Asthma   . Cerebral palsy ([place])   . Complication of anesthesia    panic attacks before surgery  . COPD (chronic obstructive pulmonary disease) ([place])   . Depression   . Diabetes mellitus without complication ([place])   . GERD (gastroesophageal reflux disease)   . Hypertension     Past Surgical History:  Procedure Laterality Date  . CESAREAN SECTION  1995  . CHOLECYSTECTOMY    . DILATATION & CURETTAGE/HYSTEROSCOPY WITH MYOSURE N/A 02/20/2018   Procedure:  DILATATION & CURETTAGE/HYSTEROSCOPY WITH MYOSURE ENDOMETRIAL POLYPECTOMY;  Surgeon: Will Bonnet, MD;  Location: [facility] ORS;  Service: Gynecology;  Laterality: N/A;  . ESOPHAGOGASTRODUODENOSCOPY (EGD) WITH PROPOFOL N/A 01/01/2019   Procedure: ESOPHAGOGASTRODUODENOSCOPY (EGD) WITH PROPOFOL;  Surgeon: Lin Landsman, MD;  Location: [facility] ENDOSCOPY;  Service: Gastroenterology;  Laterality: N/A;  . FOOT CAPSULE RELEASE W/ PERCUTANEOUS HEEL CORD LENGTHENING, TIBIAL TENDON TRANSFER      age 15 ,and 2010-both legs  . JOINT REPLACEMENT Right    both knees partial replacements dates unknown  . knee replacment     x 5 per pt. last one- left knee 2014. has had 2 on R 3 on L  . TYMPANOPLASTY WITH GRAFT Right 01/08/2018   Procedure: TYMPANOPLASTY WITH POSSIBLE OSSICULAR CHAIN RECONSTRUCTION;  Surgeon: [name], MD;  Location: [facility] ORS;  Service: ENT;  Laterality: Right;    Family Psychiatric History: I have reviewed family psychiatric history from my progress note on 09/12/2017  Family History:  Family History  Problem Relation Age of Onset  . CAD Father   . Heart attack Brother   . Sexual abuse Paternal Grandfather   . Breast cancer Neg Hx     Social History: Reviewed social history from my progress note on 09/12/2017 Social History   Socioeconomic History  . Marital status: Single    Spouse name: Not on file  . Number of children: Not on file  . Years of education: Not on file  . Highest education level: Not on file  Occupational History  . Not on file  Tobacco Use  . Smoking status: Never Smoker  . Smokeless tobacco: Never Used  Substance and Sexual Activity  . Alcohol use: No  . Drug use: No  . Sexual activity: Yes    Partners: Male    Birth control/protection: Condom  Other Topics Concern  . Not on file  Social History Narrative  . Not on file   Social Determinants of Health   Financial Resource Strain:   . Difficulty of Paying Living Expenses:   Food Insecurity:   . Worried About Charity fundraiser in the Last Year:   . Arboriculturist in the Last Year:   Transportation Needs:   . Film/video editor (Medical):   Marland Kitchen Lack of Transportation (Non-Medical):   Physical Activity:   . Days of Exercise per Week:   . Minutes of Exercise per Session:   Stress:   . Feeling of Stress :   Social Connections:   . Frequency of Communication with Friends and Family:   . Frequency of Social Gatherings with Friends and Family:   . Attends Religious  Services:   . Active Member of Clubs or Organizations:   . Attends Archivist Meetings:   Marland Kitchen Marital Status:     Allergies:  Allergies  Allergen Reactions  . Meloxicam Other (See Comments)    Damage kidney  . Morphine And Related Other (See Comments)    hallucinations  . Vantin [Cefpodoxime] Nausea And Vomiting  . Latex Itching  . Baclofen Other (See Comments)    "makes cerebral palsy do adverse reaction on me", tightens muscles   . Ciprofloxacin Itching and Nausea And Vomiting  . Tape Rash    skin tears.  Paper tape is ok    Metabolic Disorder Labs: Lab Results  Component Value Date   HGBA1C 6.1 (H) 02/19/2015   No results found for: PROLACTIN No results found for: CHOL, TRIG, HDL, CHOLHDL, VLDL,  [facility] Lab Results  Component Value Date   TSH 2.198 07/10/2016   TSH 2.891 02/19/2015    Therapeutic Level Labs: No results found for: LITHIUM No results found for: VALPROATE No components found for:  CBMZ  Current Medications: Current Outpatient Medications  Medication Sig Dispense Refill  . acetaminophen (TYLENOL) 325 MG tablet Take 2 tablets (650 mg total) by mouth every 6 (six) hours as needed. (Patient not taking: Reported on 12/11/2018) 60 tablet 0  . albuterol (PROVENTIL HFA;VENTOLIN HFA) 108 (90 BASE) MCG/ACT inhaler Inhale 2 puffs into the lungs 4 (four) times daily as needed. For shortness of breath and/or wheezing    . AMITIZA 24 MCG capsule     . ANORO ELLIPTA 62.5-25 MCG/INH AEPB     . atorvastatin (LIPITOR) 10 MG tablet     . atorvastatin (LIPITOR) 20 MG tablet Take 20 mg by mouth daily.    . Azelastine-Fluticasone 137-50 MCG/ACT SUSP Dymista 137 mcg-50 mcg/spray nasal spray    . Blood Glucose Monitoring Suppl (GLUCOCOM BLOOD GLUCOSE MONITOR) DEVI Test daily before all meals/snacks and once before bedtime.    Marland Kitchen buPROPion (WELLBUTRIN) 100 MG tablet Take 1 tablet (100 mg total) by mouth every morning. 90 tablet 1  . Chlorzoxazone 750 MG TABS Take by  mouth in the morning and at bedtime. 1 1/2 tabs BID    . Cholecalciferol (VITAMIN D3) 5000 units CAPS Take 5,000 Units by mouth daily.    Marland Kitchen CIPRODEX OTIC suspension     . clarithromycin (BIAXIN) 500 MG tablet     . clonazePAM (KLONOPIN) 0.5 MG tablet Take 0.5 mg by mouth 2 (two) times daily as needed for anxiety.    Marland Kitchen CREON 36000 units CPEP capsule     . diclofenac sodium (VOLTAREN) 1 % GEL Apply 1 application topically as needed (apply to painful areas of chest wall). 100 g 0  . dicyclomine (BENTYL) 10 MG capsule Take 10 mg by mouth as needed.     . donepezil (ARICEPT) 10 MG tablet Take 10 mg by mouth at bedtime.     Marland Kitchen FLUoxetine (PROZAC) 20 MG capsule Take 1 capsule (20 mg total) by mouth daily. To be combined with 40 mg 90 capsule 0  . FLUoxetine (PROZAC) 40 MG capsule Take 1 capsule (40 mg total) by mouth daily. To be combined with 20 mg 90 capsule 0  . furosemide (LASIX) 40 MG tablet Take 40 mg by mouth daily as needed for fluid.     Marland Kitchen glimepiride (AMARYL) 4 MG tablet Take 4 mg by mouth daily with breakfast.    . glucose blood (ONE TOUCH ULTRA TEST) test strip USE TO TEST BLOOD SUGAR TWO TIMES A DAY    . glucose blood test strip USE TO TEST BLOOD SUGAR TWO TIMES A DAY    . hydrOXYzine (VISTARIL) 50 MG capsule Take 1-2 capsules (50-100 mg total) by mouth daily as needed. For severe panic attacks only 180 capsule 0  . incobotulinumtoxinA (XEOMIN) 100 units SOLR injection Inject 100 Units into the muscle every 3 (three) months.     . Lancets (FREESTYLE) lancets Test daily before all meals/snacks    . lidocaine (LIDODERM) 5 % Place onto the skin as needed.     . mupirocin ointment (BACTROBAN) 2 % as needed.     . nitrofurantoin (MACRODANTIN) 100 MG capsule     . nystatin (MYCOSTATIN/NYSTOP) powder Apply 1 g topically daily as needed (rash).     Marland Kitchen omeprazole (PRILOSEC) 40 MG  capsule Take 40 mg by mouth daily.     Marland Kitchen oxyCODONE (OXY IR/ROXICODONE) 5 MG immediate release tablet Take 5 mg by mouth  every 6 (six) hours as needed.     Marland Kitchen oxyCODONE-acetaminophen (PERCOCET/ROXICET) 5-325 MG tablet oxycodone-acetaminophen 5 mg-325 mg tablet    . pioglitazone (ACTOS) 45 MG tablet Take 45 mg by mouth daily.    . polyethylene glycol (MIRALAX / GLYCOLAX) packet Take 17 gm mixed in liqiud as needed for constipation    . pregabalin (LYRICA) 25 MG capsule Take 50 mg by mouth at bedtime.    . solifenacin (VESICARE) 10 MG tablet Take 10 mg by mouth daily.    . Tiotropium Bromide Monohydrate (SPIRIVA RESPIMAT) 1.25 MCG/ACT AERS Inhale 1 puff into the lungs daily.    . traZODone (DESYREL) 100 MG tablet Take 1.5 tablets (150 mg total) by mouth at bedtime. 135 tablet 0   No current facility-administered medications for this visit.     Musculoskeletal: Strength & Muscle Tone: UTA Gait & Station: UTA Patient leans: N/A  Psychiatric Specialty Exam: Review of Systems  Constitutional: Positive for fatigue.  Musculoskeletal: Positive for back pain and myalgias.  Psychiatric/Behavioral: Positive for dysphoric mood and sleep disturbance. Negative for agitation, behavioral problems, confusion, decreased concentration, hallucinations, self-injury and suicidal ideas. The patient is nervous/anxious. The patient is not hyperactive.   All other systems reviewed and are negative.   Last menstrual period 08/20/2014.There is no height or weight on file to calculate BMI.  General Appearance: UTA  Eye Contact:  UTA  Speech:  Clear and Coherent  Volume:  Normal  Mood:  Anxious and Depressed  Affect:  UTA  Thought Process:  Goal Directed and Descriptions of Associations: Intact  Orientation:  Full (Time, Place, and Person)  Thought Content: Logical   Suicidal Thoughts:  No  Homicidal Thoughts:  No  Memory:  Immediate;   Fair Recent;   Fair Remote;   Fair  Judgement:  Fair  Insight:  Fair  Psychomotor Activity:  UTA  Concentration:  Concentration: Fair and Attention Span: Fair  Recall:  [employer] of  Knowledge: Fair  Language: Fair  Akathisia:  No  Handed:  Right  AIMS (if indicated): UTA  Assets:  Communication Skills Desire for Improvement Housing Social Support  ADL's:  Intact  Cognition: WNL  Sleep:  Poor   Screenings:   Assessment and Plan: Eponine is a 49 year old Caucasian female who has a history of depression, chronic pain, diabetes melitis, MCI, cerebral palsy was evaluated by phone today.  Patient is currently struggling with pain which does have an impact on her mood.  She also reports sleep issues.  Patient however would like to stay on the current dosage of antidepressants however is interested in dosage readjustment of her sleep medication trazodone.  Plan as noted below.  Plan Depression-stable Prozac 60 mg p.o. daily Wellbutrin 100 mg p.o. daily in the morning.  GAD-stable Prozac as prescribed Hydroxyzine as needed.  Encourage patient to make use of her hydroxyzine for her anxiety symptoms  Insomnia-unstable Increase trazodone to 150 mg p.o. nightly Discussed sleep hygiene techniques Discontinue melatonin.  Follow-up in clinic in 3 to 4 weeks or sooner if needed.  I have spent atleast 20 minutes non face to face with patient today. More than 50 % of the time was spent for preparing to see the patient ( e.g., review of test, records ), ordering medications and test ,psychoeducation and supportive psychotherapy and care coordination,as well as  documenting clinical information in electronic health record. This note was generated in part or whole with voice recognition software. Voice recognition is usually quite accurate but there are transcription errors that can and very often do occur. I apologize for any typographical errors that were not detected and corrected.      Ursula Alert, MD 08/12/2019, 4:56 PM

## 2019-08-12 NOTE — Patient Instructions (Signed)
Follow-up in clinic on May 11 at 3:30 PM

## 2019-09-08 ENCOUNTER — Other Ambulatory Visit: Payer: Self-pay

## 2019-09-08 ENCOUNTER — Telehealth (INDEPENDENT_AMBULATORY_CARE_PROVIDER_SITE_OTHER): Payer: Medicare Other | Admitting: Psychiatry

## 2019-09-08 DIAGNOSIS — F411 Generalized anxiety disorder: Secondary | ICD-10-CM

## 2019-09-08 DIAGNOSIS — F3342 Major depressive disorder, recurrent, in full remission: Secondary | ICD-10-CM

## 2019-09-08 DIAGNOSIS — F5105 Insomnia due to other mental disorder: Secondary | ICD-10-CM

## 2019-09-08 NOTE — Progress Notes (Signed)
Patient presented with 5 minutes into the appointment, said she will call back to reschedule.

## 2019-09-10 ENCOUNTER — Ambulatory Visit: Payer: Medicare Other

## 2019-09-10 ENCOUNTER — Other Ambulatory Visit: Payer: Self-pay

## 2019-09-10 DIAGNOSIS — R55 Syncope and collapse: Secondary | ICD-10-CM

## 2019-09-15 ENCOUNTER — Telehealth: Payer: Self-pay | Admitting: *Deleted

## 2019-09-15 NOTE — Telephone Encounter (Signed)
Spoke with patient and reviewed echocardiogram results and preliminary results of monitor. She verbalized understanding with no further questions at this time.

## 2019-09-15 NOTE — Telephone Encounter (Signed)
Left voicemail message to call back for results.  

## 2019-09-15 NOTE — Telephone Encounter (Signed)
Patient calling to discuss recent testing results  ° °Please call  ° °

## 2019-09-15 NOTE — Telephone Encounter (Signed)
-----   Message from Minna Merritts, MD sent at 09/11/2019  5:44 PM EDT ----- Normal echocardiogram Ejection fraction normal, no valve disease No indication of high pressures

## 2019-09-16 ENCOUNTER — Telehealth: Payer: Self-pay

## 2019-09-16 NOTE — Telephone Encounter (Signed)
-----   Message from Minna Merritts, MD sent at 09/16/2019  2:35 PM EDT ----- Event monitor No significant arrhythmia noted Triggered events not associated with significant arrhythmia

## 2019-09-16 NOTE — Telephone Encounter (Signed)
Call to patient to review heart monitor results.    Pt verbalized understanding and has no further questions at this time.    Advised pt to call for any further questions or concerns.  No further orders.   

## 2019-10-15 ENCOUNTER — Telehealth: Payer: Medicare Other | Admitting: Psychiatry

## 2019-10-15 ENCOUNTER — Other Ambulatory Visit: Payer: Self-pay

## 2019-10-15 ENCOUNTER — Telehealth (INDEPENDENT_AMBULATORY_CARE_PROVIDER_SITE_OTHER): Payer: Medicare Other | Admitting: Psychiatry

## 2019-10-15 ENCOUNTER — Encounter: Payer: Self-pay | Admitting: Psychiatry

## 2019-10-15 DIAGNOSIS — F5105 Insomnia due to other mental disorder: Secondary | ICD-10-CM

## 2019-10-15 DIAGNOSIS — F3342 Major depressive disorder, recurrent, in full remission: Secondary | ICD-10-CM

## 2019-10-15 DIAGNOSIS — F411 Generalized anxiety disorder: Secondary | ICD-10-CM | POA: Diagnosis not present

## 2019-10-15 MED ORDER — FLUOXETINE HCL 20 MG PO CAPS: 20.0000 mg | ORAL_CAPSULE | Freq: Every day | ORAL | 0 refills | Status: DC

## 2019-10-15 MED ORDER — FLUOXETINE HCL 40 MG PO CAPS: 40.0000 mg | ORAL_CAPSULE | Freq: Every day | ORAL | 0 refills | Status: DC

## 2019-10-15 NOTE — Progress Notes (Signed)
Provider Location : [facility] Patient Location : Home  Virtual Visit via Video Note  I connected with SHIRLE PROVENCAL on 10/15/19 at  4:45 PM EDT by a video enabled telemedicine application and verified that I am speaking with the correct person using two identifiers.   I discussed the limitations of evaluation and management by telemedicine and the availability of in person appointments. The patient expressed understanding and agreed to proceed.    I discussed the assessment and treatment plan with the patient. The patient was provided an opportunity to ask questions and all were answered. The patient agreed with the plan and demonstrated an understanding of the instructions.   The patient was advised to call back or seek an in-person evaluation if the symptoms worsen or if the condition fails to improve as anticipated.   [facility] MD OP Progress Note  10/15/2019 5:04 PM KIRAT MEZQUITA  MRN:  [id number]  Chief Complaint:  Chief Complaint    Follow-up     HPI: DANEILLE DESILVA is a 49 year old Caucasian female, lives in [place], on [place], has a history of depression, GAD, insomnia, cerebral palsy, diabetes melitis was evaluated by telemedicine today.  A video call was initiated however due to connection problem it had to be changed to a phone call.  Patient today reports she is currently doing well with regards to her mood.  She is compliant on medications as prescribed.  She denies side effects.  She reports she stopped taking the trazodone for sleep since it was not effective.  She reports she started taking melatonin however it did not work at a low dosage of 5 mg.  She however reports she currently takes 10 mg which is effective.  She wants to stay on the melatonin and does not want to make any changes with her sleep medications yet.  Patient denies any suicidality, homicidality or perceptual disturbances.  Patient denies any other concerns today.  Visit Diagnosis:    ICD-10-CM   1. MDD (major  depressive disorder), recurrent, in full remission ([place])  F33.42   2. GAD (generalized anxiety disorder)  F41.1 FLUoxetine (PROZAC) 20 MG capsule    FLUoxetine (PROZAC) 40 MG capsule  3. Insomnia due to mental condition  F51.05     Past Psychiatric History: I have reviewed past psychiatric history from my progress note on 09/12/2017.  Past trials of Xanax, Klonopin, Cymbalta, Rozerem-costly.  Past Medical History:  Past Medical History:  Diagnosis Date  . Anxiety   . Arthritis   . Asthma   . Cerebral palsy ([place])   . Complication of anesthesia    panic attacks before surgery  . COPD (chronic obstructive pulmonary disease) ([place])   . Depression   . Diabetes mellitus without complication ([place])   . GERD (gastroesophageal reflux disease)   . Hypertension     Past Surgical History:  Procedure Laterality Date  . CESAREAN SECTION  1995  . CHOLECYSTECTOMY    . DILATATION & CURETTAGE/HYSTEROSCOPY WITH MYOSURE N/A 02/20/2018   Procedure: DILATATION & CURETTAGE/HYSTEROSCOPY WITH MYOSURE ENDOMETRIAL POLYPECTOMY;  Surgeon: Will Bonnet, MD;  Location: [facility] ORS;  Service: Gynecology;  Laterality: N/A;  . ESOPHAGOGASTRODUODENOSCOPY (EGD) WITH PROPOFOL N/A 01/01/2019   Procedure: ESOPHAGOGASTRODUODENOSCOPY (EGD) WITH PROPOFOL;  Surgeon: Lin Landsman, MD;  Location[facility] ENDOSCOPY;  Service: Gastroenterology;  Laterality: N/A;  . FOOT CAPSULE RELEASE W/ PERCUTANEOUS HEEL CORD LENGTHENING, TIBIAL TENDON TRANSFER     age 89 ,and 2010-both legs  . JOINT REPLACEMENT Right  both knees partial replacements dates unknown  . knee replacment     x 5 per pt. last one- left knee 2014. has had 2 on R 3 on L  . TYMPANOPLASTY WITH GRAFT Right 01/08/2018   Procedure: TYMPANOPLASTY WITH POSSIBLE OSSICULAR CHAIN RECONSTRUCTION;  Surgeon: [name], MD;  Location: [facility] ORS;  Service: ENT;  Laterality: Right;    Family Psychiatric History: I have reviewed family psychiatric history from my progress  note on 09/12/2017.  Family History:  Family History  Problem Relation Age of Onset  . CAD Father   . Heart attack Brother   . Sexual abuse Paternal Grandfather   . Breast cancer Neg Hx     Social History: I have reviewed social history from my progress note on 09/12/2017. Social History   Socioeconomic History  . Marital status: Single    Spouse name: Not on file  . Number of children: Not on file  . Years of education: Not on file  . Highest education level: Not on file  Occupational History  . Not on file  Tobacco Use  . Smoking status: Never Smoker  . Smokeless tobacco: Never Used  Vaping Use  . Vaping Use: Never used  Substance and Sexual Activity  . Alcohol use: No  . Drug use: No  . Sexual activity: Yes    Partners: Male    Birth control/protection: Condom  Other Topics Concern  . Not on file  Social History Narrative  . Not on file   Social Determinants of Health   Financial Resource Strain:   . Difficulty of Paying Living Expenses:   Food Insecurity:   . Worried About Charity fundraiser in the Last Year:   . Arboriculturist in the Last Year:   Transportation Needs:   . Film/video editor (Medical):   Marland Kitchen Lack of Transportation (Non-Medical):   Physical Activity:   . Days of Exercise per Week:   . Minutes of Exercise per Session:   Stress:   . Feeling of Stress :   Social Connections:   . Frequency of Communication with Friends and Family:   . Frequency of Social Gatherings with Friends and Family:   . Attends Religious Services:   . Active Member of Clubs or Organizations:   . Attends Archivist Meetings:   Marland Kitchen Marital Status:     Allergies:  Allergies  Allergen Reactions  . Meloxicam Other (See Comments)    Damage kidney  . Morphine And Related Other (See Comments)    hallucinations  . Vantin [Cefpodoxime] Nausea And Vomiting  . Latex Itching  . Baclofen Other (See Comments)    "makes cerebral palsy do adverse reaction on me",  tightens muscles   . Ciprofloxacin Itching and Nausea And Vomiting  . Tape Rash    skin tears.  Paper tape is ok    Metabolic Disorder Labs: Lab Results  Component Value Date   HGBA1C 6.1 (H) 02/19/2015   No results found for: PROLACTIN No results found for: CHOL, TRIG, HDL, CHOLHDL, VLDL, LDLCALC Lab Results  Component Value Date   TSH 2.198 07/10/2016   TSH 2.891 02/19/2015    Therapeutic Level Labs: No results found for: LITHIUM No results found for: VALPROATE No components found for:  CBMZ  Current Medications: Current Outpatient Medications  Medication Sig Dispense Refill  . albuterol (PROVENTIL HFA;VENTOLIN HFA) 108 (90 BASE) MCG/ACT inhaler Inhale 2 puffs into the lungs 4 (four) times daily as needed.  For shortness of breath and/or wheezing    . AMITIZA 24 MCG capsule     . ANORO ELLIPTA 62.5-25 MCG/INH AEPB     . atorvastatin (LIPITOR) 20 MG tablet Take 20 mg by mouth daily.    . Azelastine-Fluticasone 137-50 MCG/ACT SUSP Dymista 137 mcg-50 mcg/spray nasal spray    . Blood Glucose Monitoring Suppl (GLUCOCOM BLOOD GLUCOSE MONITOR) DEVI Test daily before all meals/snacks and once before bedtime.    Marland Kitchen buPROPion (WELLBUTRIN) 100 MG tablet Take 1 tablet (100 mg total) by mouth every morning. 90 tablet 1  . Chlorzoxazone 750 MG TABS Take by mouth in the morning and at bedtime. 1 1/2 tabs BID    . CREON 36000 units CPEP capsule     . diclofenac sodium (VOLTAREN) 1 % GEL Apply 1 application topically as needed (apply to painful areas of chest wall). 100 g 0  . Diclofenac Sodium CR 100 MG 24 hr tablet Take 100 mg by mouth daily.    Marland Kitchen dicyclomine (BENTYL) 10 MG capsule Take 10 mg by mouth as needed.     . donepezil (ARICEPT) 10 MG tablet Take 10 mg by mouth at bedtime.     Marland Kitchen FLUoxetine (PROZAC) 20 MG capsule Take 1 capsule (20 mg total) by mouth daily. To be combined with 40 mg 90 capsule 0  . FLUoxetine (PROZAC) 40 MG capsule Take 1 capsule (40 mg total) by mouth daily. To be  combined with 20 mg 90 capsule 0  . glimepiride (AMARYL) 4 MG tablet Take 4 mg by mouth daily with breakfast.    . glucose blood (ONE TOUCH ULTRA TEST) test strip USE TO TEST BLOOD SUGAR TWO TIMES A DAY    . glucose blood test strip USE TO TEST BLOOD SUGAR TWO TIMES A DAY    . hydrOXYzine (ATARAX/VISTARIL) 50 MG tablet Take 50 mg by mouth 2 (two) times daily.    Marland Kitchen incobotulinumtoxinA (XEOMIN) 100 units SOLR injection Inject 100 Units into the muscle every 3 (three) months.     . Lancets (FREESTYLE) lancets Test daily before all meals/snacks    . lidocaine (LIDODERM) 5 % Place onto the skin as needed.     . mupirocin ointment (BACTROBAN) 2 % as needed.     . nitrofurantoin (MACRODANTIN) 100 MG capsule     . nystatin (MYCOSTATIN/NYSTOP) powder Apply 1 g topically daily as needed (rash).     Marland Kitchen omeprazole (PRILOSEC) 40 MG capsule Take 40 mg by mouth daily.     Marland Kitchen oxyCODONE (OXY IR/ROXICODONE) 5 MG immediate release tablet Take 5 mg by mouth every 6 (six) hours as needed.     Marland Kitchen oxyCODONE-acetaminophen (PERCOCET/ROXICET) 5-325 MG tablet oxycodone-acetaminophen 5 mg-325 mg tablet    . pioglitazone (ACTOS) 45 MG tablet Take 45 mg by mouth daily.    . polyethylene glycol (MIRALAX / GLYCOLAX) packet Take 17 gm mixed in liqiud as needed for constipation    . solifenacin (VESICARE) 10 MG tablet Take 10 mg by mouth daily.    . Tiotropium Bromide Monohydrate (SPIRIVA RESPIMAT) 1.25 MCG/ACT AERS Inhale 1 puff into the lungs daily.    Marland Kitchen acetaminophen (TYLENOL) 325 MG tablet Take 2 tablets (650 mg total) by mouth every 6 (six) hours as needed. (Patient not taking: Reported on 10/15/2019) 60 tablet 0  . atorvastatin (LIPITOR) 10 MG tablet  (Patient not taking: Reported on 10/15/2019)    . Cholecalciferol (VITAMIN D3) 5000 units CAPS Take 5,000 Units by mouth daily. (Patient not  taking: Reported on 10/15/2019)    . CIPRODEX OTIC suspension  (Patient not taking: Reported on 10/15/2019)    . clarithromycin (BIAXIN) 500  MG tablet  (Patient not taking: Reported on 10/15/2019)    . diclofenac Sodium (VOLTAREN) 1 % GEL Apply 1 application topically 4 (four) times daily. (Patient not taking: Reported on 10/15/2019)    . furosemide (LASIX) 40 MG tablet Take 40 mg by mouth daily as needed for fluid.  (Patient not taking: Reported on 10/15/2019)    . hydrOXYzine (VISTARIL) 50 MG capsule Take 1-2 capsules (50-100 mg total) by mouth daily as needed. For severe panic attacks only (Patient not taking: Reported on 10/15/2019) 180 capsule 0  . methylPREDNISolone (MEDROL DOSEPAK) 4 MG TBPK tablet  (Patient not taking: Reported on 10/15/2019)     No current facility-administered medications for this visit.     Musculoskeletal: Strength & Muscle Tone: UTA Gait & Station: UTA Patient leans: N/A  Psychiatric Specialty Exam: Review of Systems  Psychiatric/Behavioral: Positive for sleep disturbance (Improving).  All other systems reviewed and are negative.   Last menstrual period 08/20/2014.There is no height or weight on file to calculate BMI.  General Appearance: UTA  Eye Contact:  UTA  Speech:  Clear and Coherent  Volume:  Normal  Mood:  Euthymic  Affect:  UTA  Thought Process:  Goal Directed and Descriptions of Associations: Intact  Orientation:  Full (Time, Place, and Person)  Thought Content: Logical   Suicidal Thoughts:  No  Homicidal Thoughts:  No  Memory:  Immediate;   Fair Recent;   Fair Remote;   Fair  Judgement:  Fair  Insight:  Fair  Psychomotor Activity:  UTA  Concentration:  Concentration: Fair and Attention Span: Fair  Recall:  [employer] of Knowledge: Fair  Language: Fair  Akathisia:  No  Handed:  Right  AIMS (if indicated): UTA  Assets:  Communication Skills Desire for Improvement Housing Social Support  ADL's:  Intact  Cognition: WNL  Sleep:  Improving   Screenings:   Assessment and Plan: AZIZA STUCKERT is a 49 year old Caucasian female who has a history of depression, chronic pain,  diabetes melitis, MCI, cerebral palsy was evaluated by telemedicine today.  Patient is currently making progress with regards to her sleep on melatonin.  Discussed plan as noted below.  Plan Depression in remission Prozac 60 mg p.o. daily Wellbutrin 100 mg p.o. daily in the morning  GAD-stable Prozac as prescribed Hydroxyzine as needed.    Insomnia-improving Melatonin 5 to 10 mg p.o. nightly. Discontinue trazodone for noncompliance.  Follow-up in clinic in 2 to 3 months or sooner if needed.  I have spent atleast 20 minutes non face to face with patient today. More than 50 % of the time was spent for preparing to see the patient ( e.g., review of test, records ),  ordering medications and test ,psychoeducation and supportive psychotherapy and care coordination,as well as documenting clinical information in electronic health record. This note was generated in part or whole with voice recognition software. Voice recognition is usually quite accurate but there are transcription errors that can and very often do occur. I apologize for any typographical errors that were not detected and corrected.       Ursula Alert, MD 10/15/2019, 5:04 PM

## 2020-01-18 ENCOUNTER — Other Ambulatory Visit: Payer: Self-pay

## 2020-01-18 ENCOUNTER — Telehealth (INDEPENDENT_AMBULATORY_CARE_PROVIDER_SITE_OTHER): Payer: Medicare Other | Admitting: Psychiatry

## 2020-01-18 ENCOUNTER — Encounter: Payer: Self-pay | Admitting: Psychiatry

## 2020-01-18 DIAGNOSIS — F330 Major depressive disorder, recurrent, mild: Secondary | ICD-10-CM | POA: Diagnosis not present

## 2020-01-18 DIAGNOSIS — F411 Generalized anxiety disorder: Secondary | ICD-10-CM

## 2020-01-18 DIAGNOSIS — F5105 Insomnia due to other mental disorder: Secondary | ICD-10-CM

## 2020-01-18 MED ORDER — HYDROXYZINE PAMOATE 50 MG PO CAPS
50.0000 mg | ORAL_CAPSULE | Freq: Every day | ORAL | 1 refills | Status: DC | PRN
Start: 2020-01-18 — End: 2020-06-30

## 2020-01-18 MED ORDER — BUPROPION HCL 100 MG PO TABS: 100.0000 mg | ORAL_TABLET | Freq: Every morning | ORAL | 1 refills | Status: DC

## 2020-01-18 MED ORDER — FLUOXETINE HCL 20 MG PO CAPS: 20.0000 mg | ORAL_CAPSULE | Freq: Every day | ORAL | 1 refills | Status: DC

## 2020-01-18 MED ORDER — FLUOXETINE HCL 40 MG PO CAPS: 40.0000 mg | ORAL_CAPSULE | Freq: Every day | ORAL | 1 refills | Status: DC

## 2020-01-18 MED ORDER — TRAZODONE HCL 100 MG PO TABS: 100.0000 mg | ORAL_TABLET | Freq: Every day | ORAL | 0 refills | Status: DC

## 2020-01-18 NOTE — Progress Notes (Signed)
Provider Location : [facility] Patient Location : Home  Participants: Patient , Provider  Virtual Visit via Telephone Note  I connected with Traci Davis on 01/18/20 at  4:40 PM EDT by telephone and verified that I am speaking with the correct person using two identifiers.   I discussed the limitations, risks, security and privacy concerns of performing an evaluation and management service by telephone and the availability of in person appointments. I also discussed with the patient that there may be a patient responsible charge related to this service. The patient expressed understanding and agreed to proceed.      I discussed the assessment and treatment plan with the patient. The patient was provided an opportunity to ask questions and all were answered. The patient agreed with the plan and demonstrated an understanding of the instructions.   The patient was advised to call back or seek an in-person evaluation if the symptoms worsen or if the condition fails to improve as anticipated.    [place] MD OP Progress Note  01/18/2020 5:01 PM Traci Davis  MRN:  [id number]  Chief Complaint:  Chief Complaint    Follow-up     HPI: Traci Davis is a 49 year old Caucasian female, lives in [place], has a history of depression, GAD, insomnia, cerebral palsy, diabetes melitis was evaluated by phone today. Patient today reports she is currently doing okay on the current medication regimen.  She continues to take her Prozac, Wellbutrin and the hydroxyzine as needed.  She denies side effects.  She reports she had trouble sleeping and has been back on the trazodone and currently takes 150 mg at bedtime which does help.  She does continue to struggle with a lot of neck pain, back pain and is currently under the treatment of her pain providers.  She reports the trazodone has definitely helped even though sleep is restless due to her pain.  Patient denies any suicidality, homicidality or perceptual  disturbances.  Patient denies any other concerns today.    Visit Diagnosis:    ICD-10-CM   1. MDD (major depressive disorder), recurrent episode, mild (HCC)  F33.0 buPROPion (WELLBUTRIN) 100 MG tablet    traZODone (DESYREL) 100 MG tablet  2. GAD (generalized anxiety disorder)  F41.1 FLUoxetine (PROZAC) 20 MG capsule    FLUoxetine (PROZAC) 40 MG capsule    hydrOXYzine (VISTARIL) 50 MG capsule  3. Insomnia due to mental condition  F51.05     Past Psychiatric History: I have reviewed past psychiatric history from my progress note on 09/12/2017.  Past trials of Xanax, Klonopin, Cymbalta, Rozerem-costly.  Past Medical History:  Past Medical History:  Diagnosis Date  . Anxiety   . Arthritis   . Asthma   . Cerebral palsy ([place])   . Complication of anesthesia    panic attacks before surgery  . COPD (chronic obstructive pulmonary disease) ([place])   . Depression   . Diabetes mellitus without complication ([place])   . GERD (gastroesophageal reflux disease)   . Hypertension     Past Surgical History:  Procedure Laterality Date  . CESAREAN SECTION  1995  . CHOLECYSTECTOMY    . DILATATION & CURETTAGE/HYSTEROSCOPY WITH MYOSURE N/A 02/20/2018   Procedure: DILATATION & CURETTAGE/HYSTEROSCOPY WITH MYOSURE ENDOMETRIAL POLYPECTOMY;  Surgeon: Will Bonnet, MD;  Location: [facility];  Service: Gynecology;  Laterality: N/A;  . ESOPHAGOGASTRODUODENOSCOPY (EGD) WITH PROPOFOL N/A 01/01/2019   Procedure: ESOPHAGOGASTRODUODENOSCOPY (EGD) WITH PROPOFOL;  Surgeon: Lin Landsman, MD;  Location: [facility];  Service: Gastroenterology;  Laterality: N/A;  . FOOT CAPSULE RELEASE W/ PERCUTANEOUS HEEL CORD LENGTHENING, TIBIAL TENDON TRANSFER     age 12 ,and 2010-both legs  . JOINT REPLACEMENT Right    both knees partial replacements dates unknown  . knee replacment     x 5 per pt. last one- left knee 2014. has had 2 on R 3 on L  . TYMPANOPLASTY WITH GRAFT Right 01/08/2018   Procedure: TYMPANOPLASTY  WITH POSSIBLE OSSICULAR CHAIN RECONSTRUCTION;  Surgeon: [name], MD;  Location: [facility];  Service: ENT;  Laterality: Right;    Family Psychiatric History: I have reviewed family psychiatric history from my progress note on 09/12/2017.  Family History:  Family History  Problem Relation Age of Onset  . CAD Father   . Heart attack Brother   . Sexual abuse Paternal Grandfather   . Breast cancer Neg Hx     Social History: I have reviewed social history from my progress note on 09/12/2017. Social History   Socioeconomic History  . Marital status: Single    Spouse name: Not on file  . Number of children: Not on file  . Years of education: Not on file  . Highest education level: Not on file  Occupational History  . Not on file  Tobacco Use  . Smoking status: Never Smoker  . Smokeless tobacco: Never Used  Vaping Use  . Vaping Use: Never used  Substance and Sexual Activity  . Alcohol use: No  . Drug use: No  . Sexual activity: Yes    Partners: Male    Birth control/protection: Condom  Other Topics Concern  . Not on file  Social History Narrative  . Not on file   Social Determinants of Health   Financial Resource Strain:   . Difficulty of Paying Living Expenses: Not on file  Food Insecurity:   . Worried About Charity fundraiser in the Last Year: Not on file  . Ran Out of Food in the Last Year: Not on file  Transportation Needs:   . Lack of Transportation (Medical): Not on file  . Lack of Transportation (Non-Medical): Not on file  Physical Activity:   . Days of Exercise per Week: Not on file  . Minutes of Exercise per Session: Not on file  Stress:   . Feeling of Stress : Not on file  Social Connections:   . Frequency of Communication with Friends and Family: Not on file  . Frequency of Social Gatherings with Friends and Family: Not on file  . Attends Religious Services: Not on file  . Active Member of Clubs or Organizations: Not on file  . Attends Theatre manager Meetings: Not on file  . Marital Status: Not on file    Allergies:  Allergies  Allergen Reactions  . Meloxicam Other (See Comments)    Damage kidney  . Morphine And Related Other (See Comments)    hallucinations  . Vantin [Cefpodoxime] Nausea And Vomiting  . Latex Itching  . Baclofen Other (See Comments)    "makes cerebral palsy do adverse reaction on me", tightens muscles   . Ciprofloxacin Itching and Nausea And Vomiting  . Tape Rash    skin tears.  Paper tape is ok  . Tramadol Nausea Only    Metabolic Disorder Labs: Lab Results  Component Value Date   HGBA1C 6.1 (H) 02/19/2015   No results found for: PROLACTIN No results found for: CHOL, TRIG, HDL, CHOLHDL, VLDL, LDLCALC Lab Results  Component  Value Date   TSH 2.198 07/10/2016   TSH 2.891 02/19/2015    Therapeutic Level Labs: No results found for: LITHIUM No results found for: VALPROATE No components found for:  CBMZ  Current Medications: Current Outpatient Medications  Medication Sig Dispense Refill  . gabapentin (NEURONTIN) 300 MG capsule Take by mouth.    . lidocaine (LIDODERM) 5 % Place onto the skin.    . Misc. Devices (ROLLATOR ULTRA-LIGHT) MISC Rollator with breaks. CPT [phone] or CPT [phone]    . naloxone (NARCAN) nasal spray 4 mg/0.1 mL One spray in either nostril once for known/suspected opioid overdose. May repeat every 2-3 minutes in alternating nostril til EMS arrives    . oxyCODONE (OXY IR/ROXICODONE) 5 MG immediate release tablet Take by mouth.    . oxyCODONE (OXYCONTIN) 10 mg 12 hr tablet Take by mouth.    . traZODone (DESYREL) 100 MG tablet Take 1 tablet (100 mg total) by mouth at bedtime. 135 tablet 0  . acetaminophen (TYLENOL) 325 MG tablet Take 2 tablets (650 mg total) by mouth every 6 (six) hours as needed. (Patient not taking: Reported on 10/15/2019) 60 tablet 0  . albuterol (PROVENTIL HFA;VENTOLIN HFA) 108 (90 BASE) MCG/ACT inhaler Inhale 2 puffs into the lungs 4 (four) times daily as  needed. For shortness of breath and/or wheezing    . AMITIZA 24 MCG capsule     . ANORO ELLIPTA 62.5-25 MCG/INH AEPB     . atorvastatin (LIPITOR) 10 MG tablet  (Patient not taking: Reported on 10/15/2019)    . atorvastatin (LIPITOR) 20 MG tablet Take 20 mg by mouth daily.    . Azelastine-Fluticasone 137-50 MCG/ACT SUSP Dymista 137 mcg-50 mcg/spray nasal spray    . Blood Glucose Monitoring Suppl (GLUCOCOM BLOOD GLUCOSE MONITOR) DEVI Test daily before all meals/snacks and once before bedtime.    Marland Kitchen buPROPion (WELLBUTRIN) 100 MG tablet Take 1 tablet (100 mg total) by mouth every morning. 90 tablet 1  . Chlorzoxazone 750 MG TABS Take by mouth in the morning and at bedtime. 1 1/2 tabs BID    . Cholecalciferol (VITAMIN D3) 5000 units CAPS Take 5,000 Units by mouth daily. (Patient not taking: Reported on 10/15/2019)    . CIPRODEX OTIC suspension  (Patient not taking: Reported on 10/15/2019)    . clarithromycin (BIAXIN) 500 MG tablet  (Patient not taking: Reported on 10/15/2019)    . CREON 36000 units CPEP capsule     . diclofenac sodium (VOLTAREN) 1 % GEL Apply 1 application topically as needed (apply to painful areas of chest wall). 100 g 0  . diclofenac Sodium (VOLTAREN) 1 % GEL Apply 1 application topically 4 (four) times daily. (Patient not taking: Reported on 10/15/2019)    . Diclofenac Sodium CR 100 MG 24 hr tablet Take 100 mg by mouth daily.    Marland Kitchen dicyclomine (BENTYL) 10 MG capsule Take 10 mg by mouth as needed.     . donepezil (ARICEPT) 10 MG tablet Take 10 mg by mouth at bedtime.     Marland Kitchen FLUoxetine (PROZAC) 20 MG capsule Take 1 capsule (20 mg total) by mouth daily. To be combined with 40 mg 90 capsule 1  . FLUoxetine (PROZAC) 40 MG capsule Take 1 capsule (40 mg total) by mouth daily. To be combined with 20 mg 90 capsule 1  . furosemide (LASIX) 40 MG tablet Take 40 mg by mouth daily as needed for fluid.  (Patient not taking: Reported on 10/15/2019)    . glimepiride (AMARYL) 4 MG  tablet Take 4 mg by mouth  daily with breakfast.    . glucose blood (ONE TOUCH ULTRA TEST) test strip USE TO TEST BLOOD SUGAR TWO TIMES A DAY    . glucose blood test strip USE TO TEST BLOOD SUGAR TWO TIMES A DAY    . hydrOXYzine (VISTARIL) 50 MG capsule Take 1-2 capsules (50-100 mg total) by mouth daily as needed. For severe panic attacks only 180 capsule 1  . incobotulinumtoxinA (XEOMIN) 100 units SOLR injection Inject 100 Units into the muscle every 3 (three) months.     . Lancets (FREESTYLE) lancets Test daily before all meals/snacks    . lidocaine (LIDODERM) 5 % Place onto the skin as needed.     . methylPREDNISolone (MEDROL DOSEPAK) 4 MG TBPK tablet  (Patient not taking: Reported on 10/15/2019)    . mupirocin ointment (BACTROBAN) 2 % as needed.     . nitrofurantoin (MACRODANTIN) 100 MG capsule     . nystatin (MYCOSTATIN/NYSTOP) powder Apply 1 g topically daily as needed (rash).     Marland Kitchen omeprazole (PRILOSEC) 40 MG capsule Take 40 mg by mouth daily.     Marland Kitchen oxyCODONE (OXY IR/ROXICODONE) 5 MG immediate release tablet Take 5 mg by mouth every 6 (six) hours as needed.     Marland Kitchen oxyCODONE-acetaminophen (PERCOCET/ROXICET) 5-325 MG tablet oxycodone-acetaminophen 5 mg-325 mg tablet    . pioglitazone (ACTOS) 45 MG tablet Take 45 mg by mouth daily.    . polyethylene glycol (MIRALAX / GLYCOLAX) packet Take 17 gm mixed in liqiud as needed for constipation    . solifenacin (VESICARE) 10 MG tablet Take 10 mg by mouth daily.    . Tiotropium Bromide Monohydrate (SPIRIVA RESPIMAT) 1.25 MCG/ACT AERS Inhale 1 puff into the lungs daily.     No current facility-administered medications for this visit.     Musculoskeletal: Strength & Muscle Tone: UTA Gait & Station: Wheel chair bound Patient leans: N/A  Psychiatric Specialty Exam: Review of Systems  Musculoskeletal: Positive for arthralgias, back pain and neck pain.  Psychiatric/Behavioral: Positive for sleep disturbance (improving). Negative for agitation, behavioral problems, confusion,  decreased concentration, dysphoric mood, hallucinations and suicidal ideas. The patient is not nervous/anxious and is not hyperactive.   All other systems reviewed and are negative.   Last menstrual period 08/20/2014.There is no height or weight on file to calculate BMI.  General Appearance: UTA  Eye Contact:  UTA  Speech:  Clear and Coherent  Volume:  Normal  Mood:  Euthymic  Affect:  UTA  Thought Process:  Goal Directed and Descriptions of Associations: Intact  Orientation:  Full (Time, Place, and Person)  Thought Content: Logical   Suicidal Thoughts:  No  Homicidal Thoughts:  No  Memory:  Immediate;   Fair Recent;   Fair Remote;   Fair  Judgement:  Fair  Insight:  Fair  Psychomotor Activity:  UTA  Concentration:  Concentration: Fair and Attention Span: Fair  Recall:  [employer] of Knowledge: Fair  Language: Fair  Akathisia:  No  Handed:  Right  AIMS (if indicated): UTA  Assets:  Communication Skills Desire for Improvement Housing Social Support  ADL's:  Intact  Cognition: WNL  Sleep:  Fair   Screenings:   Assessment and Plan: Traci Davis is a 49 year old Caucasian female who has a history of depression and multiple medical problems was evaluated by phone today.  Patient is currently stable on the current medication regimen.  Plan as noted below.  Plan Depression in remission  Prozac 60 mg p.o. daily Wellbutrin 100 mg p.o. daily in the morning  GAD-stable Prozac as prescribed Hydroxyzine as needed  Insomnia-improving Trazodone 150 mg p.o. nightly.  Follow-up in clinic in 2 months or sooner if needed.  I have spent atleast 20 minutes non face to face with patient today. More than 50 % of the time was spent for preparing to see the patient ( e.g., review of test, records ),  ordering medications and test ,psychoeducation and supportive psychotherapy and care coordination,as well as documenting clinical information in electronic health record. This note was  generated in part or whole with voice recognition software. Voice recognition is usually quite accurate but there are transcription errors that can and very often do occur. I apologize for any typographical errors that were not detected and corrected.        Ursula Alert, MD 01/19/2020, 8:20 AM

## 2020-01-20 ENCOUNTER — Ambulatory Visit: Payer: Medicare Other | Admitting: Physical Therapy

## 2020-01-26 ENCOUNTER — Ambulatory Visit: Payer: Medicare Other | Admitting: Physical Therapy

## 2020-01-29 DIAGNOSIS — M76899 Other specified enthesopathies of unspecified lower limb, excluding foot: Secondary | ICD-10-CM | POA: Insufficient documentation

## 2020-02-01 ENCOUNTER — Ambulatory Visit: Payer: Medicare Other | Admitting: Physical Therapy

## 2020-02-04 ENCOUNTER — Ambulatory Visit: Payer: Medicare Other | Admitting: Physical Therapy

## 2020-02-04 ENCOUNTER — Encounter: Payer: Medicare Other | Admitting: Physical Therapy

## 2020-02-10 ENCOUNTER — Encounter: Payer: Medicare Other | Admitting: Physical Therapy

## 2020-02-17 ENCOUNTER — Encounter: Payer: Medicare Other | Admitting: Physical Therapy

## 2020-02-19 ENCOUNTER — Encounter: Payer: Self-pay | Admitting: Emergency Medicine

## 2020-02-19 ENCOUNTER — Other Ambulatory Visit: Payer: Self-pay

## 2020-02-19 ENCOUNTER — Emergency Department
Admission: EM | Admit: 2020-02-19 | Discharge: 2020-02-19 | Disposition: A | Discharge status: Home / Self Care | Payer: Medicare Other | Attending: Emergency Medicine | Admitting: Emergency Medicine

## 2020-02-19 DIAGNOSIS — Z7984 Long term (current) use of oral hypoglycemic drugs: Secondary | ICD-10-CM | POA: Diagnosis not present

## 2020-02-19 DIAGNOSIS — R739 Hyperglycemia, unspecified: Secondary | ICD-10-CM

## 2020-02-19 DIAGNOSIS — E1165 Type 2 diabetes mellitus with hyperglycemia: Secondary | ICD-10-CM | POA: Insufficient documentation

## 2020-02-19 DIAGNOSIS — J449 Chronic obstructive pulmonary disease, unspecified: Secondary | ICD-10-CM | POA: Diagnosis not present

## 2020-02-19 DIAGNOSIS — Z7951 Long term (current) use of inhaled steroids: Secondary | ICD-10-CM | POA: Diagnosis not present

## 2020-02-19 DIAGNOSIS — Z91040 Latex allergy status: Secondary | ICD-10-CM | POA: Insufficient documentation

## 2020-02-19 DIAGNOSIS — Z96653 Presence of artificial knee joint, bilateral: Secondary | ICD-10-CM | POA: Diagnosis not present

## 2020-02-19 DIAGNOSIS — Z79899 Other long term (current) drug therapy: Secondary | ICD-10-CM | POA: Insufficient documentation

## 2020-02-19 DIAGNOSIS — I10 Essential (primary) hypertension: Secondary | ICD-10-CM | POA: Diagnosis not present

## 2020-02-19 LAB — BASIC METABOLIC PANEL
Anion gap: 11 (ref 5–15)
BUN: 11 mg/dL (ref 6–20)
CO2: 25 mmol/L (ref 22–32)
Calcium: 9.3 mg/dL (ref 8.9–10.3)
Chloride: 103 mmol/L (ref 98–111)
Creatinine, Ser: 1 mg/dL (ref 0.44–1.00)
GFR, Estimated: >60 mL/min (ref >60)
Glucose, Bld: 68 mg/dL — ABNORMAL LOW (ref 70–99)
Potassium: 3.4 mmol/L — ABNORMAL LOW (ref 3.5–5.1)
Sodium: 139 mmol/L (ref 135–145)

## 2020-02-19 LAB — CBC
HCT: 44 % (ref 36.0–46.0)
Hemoglobin: 14.6 g/dL (ref 12.0–15.0)
MCH: 27.1 pg (ref 26.0–34.0)
MCHC: 33.2 g/dL (ref 30.0–36.0)
MCV: 81.6 fL (ref 80.0–100.0)
Platelets: 240 10*3/uL (ref 150–400)
RBC: 5.39 MIL/uL — ABNORMAL HIGH (ref 3.87–5.11)
RDW: 15.2 % (ref 11.5–15.5)
WBC: 9.1 10*3/uL (ref 4.0–10.5)
nRBC: 0 % (ref 0.0–0.2)

## 2020-02-19 LAB — GLUCOSE, CAPILLARY
Glucose-Capillary: 65 mg/dL — ABNORMAL LOW (ref 70–99)
Glucose-Capillary: 72 mg/dL (ref 70–99)
Glucose-Capillary: 62 mg/dL — ABNORMAL LOW (ref 70–99)
Glucose-Capillary: 78 mg/dL (ref 70–99)
Glucose-Capillary: 114 mg/dL — ABNORMAL HIGH (ref 70–99)

## 2020-02-19 LAB — URINALYSIS, COMPLETE (UACMP) WITH MICROSCOPIC
Bilirubin Urine: NEGATIVE
Glucose, UA: NEGATIVE mg/dL
Hgb urine dipstick: NEGATIVE
Ketones, ur: NEGATIVE mg/dL
Leukocytes,Ua: NEGATIVE
Nitrite: NEGATIVE
Protein, ur: NEGATIVE mg/dL
Specific Gravity, Urine: 1.005 (ref 1.005–1.030)
pH: 7 (ref 5.0–8.0)

## 2020-02-19 NOTE — ED Notes (Signed)
Handoff given to [employer]

## 2020-02-19 NOTE — ED Notes (Signed)
Pt asking for something to eat, states feels like her blood sugar is dropping and hasn't eaten all day. CBG rechecked by Threasa Beards, EDT. CBG 62, pt given OJ and peanut butter crackers.

## 2020-02-19 NOTE — ED Provider Notes (Signed)
[facility] Emergency Department Provider Note   ____________________________________________   First MD Initiated Contact with Patient 02/19/20 1819     (approximate)  I have reviewed the triage vital signs and the nursing notes.   HISTORY  Chief Complaint Hyperglycemia    HPI Traci Davis is a 49 y.o. female with past medical history of hypertension, diabetes, cerebral palsy, COPD, GERD, and asthma who presents to the ED complaining of hyperglycemia.  Patient reports that she checked her blood glucose earlier today and that it was 204.  She started to feel dizzy and weak, which she was concerned could be related to her blood glucose as she does not usually run that high.  She reports feeling better following arrival to the ED, denies any further dizziness or weakness.  She denies any recent fevers, cough, chest pain, or shortness of breath.  She denies any recent changes to her diabetic regimen and she has been taking medicine as prescribed.        Past Medical History:  Diagnosis Date  . Anxiety   . Arthritis   . Asthma   . Cerebral palsy ([place])   . Complication of anesthesia    panic attacks before surgery  . COPD (chronic obstructive pulmonary disease) ([place])   . Depression   . Diabetes mellitus without complication ([place])   . GERD (gastroesophageal reflux disease)   . Hypertension     Patient Active Problem List   Diagnosis Date Noted  . MDD (major depressive disorder), recurrent, in full remission ([place]) 08/12/2019  . Seizure-like activity ([place]) 07/08/2019  . MDD (major depressive disorder), recurrent episode, mild ([place]) 12/29/2018  . Insomnia due to mental condition 12/29/2018  . Disorder of vein 03/04/2018  . Lumbar sprain 03/04/2018  . Muscle weakness 03/04/2018  . Neck sprain 03/04/2018  . 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  . Postmenopausal bleeding 02/18/2018  . Impingement syndrome of shoulder region 02/04/2018  . Chest pain with  moderate risk for cardiac etiology 05/19/2017  . History of depression 04/15/2017  . GAD (generalized anxiety disorder) 04/15/2017  . Chronic daily headache 04/15/2017  . Panic attack 04/15/2017  . Nocturnal enuresis 04/15/2017  . Mild cognitive impairment 04/15/2017  . Loss of memory 01/14/2017  . Pain medication agreement signed 01/08/2017  . Headache disorder 12/04/2016  . Chronic, continuous use of opioids 07/01/2015  . Vertigo 07/01/2015  . Chronic midline low back pain without sciatica 03/21/2015  . Acute renal failure (ARF) ([place]) 02/19/2015  . Type 2 diabetes mellitus without complication ([place]) 25/00/3704  . Acute pancreatitis 09/04/2014  . Cerebral palsy ([place]) 01/05/2014  . 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 Hip pain, chronic 04/28/2013  . Essential hypertension 01/10/2013  . Irritable colon 01/10/2013  . Noninfectious gastroenteritis and colitis 01/10/2013  . Encounter for long-term (current) use of other medications 12/04/2012  . Chronic GERD 08/12/2012  . Epigastric pain 08/12/2012  . Diarrhea 08/12/2012  . Primary localized osteoarthrosis, lower leg 05/07/2012  . Difficulty walking 02/06/2012    Past Surgical History:  Procedure Laterality Date  . CESAREAN SECTION  1995  . CHOLECYSTECTOMY    . DILATATION & CURETTAGE/HYSTEROSCOPY WITH MYOSURE N/A 02/20/2018   Procedure: DILATATION & CURETTAGE/HYSTEROSCOPY WITH MYOSURE ENDOMETRIAL POLYPECTOMY;  Surgeon: Will Bonnet, MD;  Location: [facility] ORS;  Service: Gynecology;  Laterality: N/A;  . ESOPHAGOGASTRODUODENOSCOPY (EGD) WITH PROPOFOL N/A 01/01/2019   Procedure: ESOPHAGOGASTRODUODENOSCOPY (EGD) WITH PROPOFOL;  Surgeon: Lin Landsman, MD;  Location: [facility];  Service: Gastroenterology;  Laterality: N/A;  . FOOT CAPSULE RELEASE W/ PERCUTANEOUS HEEL CORD LENGTHENING, TIBIAL TENDON TRANSFER     age 77 ,and 2010-both legs  . JOINT REPLACEMENT Right    both knees partial replacements  dates unknown  . knee replacment     x 5 per pt. last one- left knee 2014. has had 2 on R 3 on L  . TYMPANOPLASTY WITH GRAFT Right 01/08/2018   Procedure: TYMPANOPLASTY WITH POSSIBLE OSSICULAR CHAIN RECONSTRUCTION;  Surgeon: [name], MD;  Location: [facility] OR[facility];  Service: ENT;  Laterality: Right;    Prior to Admission medications   Medication Sig Start Date End Date Taking? Authorizing Provider  acetaminophen (TYLENOL) 325 MG tablet Take 2 tablets (650 mg total) by mouth every 6 (six) hours as needed. Patient not taking: Reported on 10/15/2019 10/05/18   Carrie Mew, MD  albuterol (PROVENTIL HFA;VENTOLIN HFA) 108 (90 BASE) MCG/ACT inhaler Inhale 2 puffs into the lungs 4 (four) times daily as needed. For shortness of breath and/or wheezing 09/22/14   [provider]  AMITIZA 24 MCG capsule  10/28/18   [provider]  Celedonio Miyamoto 62.5-25 MCG/INH AEPB  05/07/19   [provider]  atorvastatin (LIPITOR) 10 MG tablet  05/26/18   [provider]  atorvastatin (LIPITOR) 20 MG tablet Take 20 mg by mouth daily. 04/16/19   [provider]  Azelastine-Fluticasone 137-50 MCG/ACT SUSP Dymista 137 mcg-50 mcg/spray nasal spray    [provider]  Blood Glucose Monitoring Suppl (GLUCOCOM BLOOD GLUCOSE MONITOR) DEVI Test daily before all meals/snacks and once before bedtime. 09/07/14   [provider]  buPROPion (WELLBUTRIN) 100 MG tablet Take 1 tablet (100 mg total) by mouth every morning. 01/18/20   Ursula Alert, MD  Chlorzoxazone 750 MG TABS Take by mouth in the morning and at bedtime. 1 1/2 tabs BID 02/05/19   [provider]  Cholecalciferol (VITAMIN D3) 5000 units CAPS Take 5,000 Units by mouth daily. Patient not taking: Reported on 10/15/2019    [provider]  [place] suspension  12/16/18   [provider]  clarithromycin Binnie Rail) 500 MG tablet  01/08/19   [provider]  CREON 36000 units CPEP  capsule  03/26/18   [provider]  diclofenac sodium (VOLTAREN) 1 % GEL Apply 1 application topically as needed (apply to painful areas of chest wall). 10/05/18   Carrie Mew, MD  diclofenac Sodium (VOLTAREN) 1 % GEL Apply 1 application topically 4 (four) times daily. Patient not taking: Reported on 10/15/2019 09/01/19   [provider]  Diclofenac Sodium CR 100 MG 24 hr tablet Take 100 mg by mouth daily. 09/09/19   [provider]  dicyclomine (BENTYL) 10 MG capsule Take 10 mg by mouth as needed.  07/15/17   [provider]  donepezil (ARICEPT) 10 MG tablet Take 10 mg by mouth at bedtime.  05/21/17   [provider]  FLUoxetine (PROZAC) 20 MG capsule Take 1 capsule (20 mg total) by mouth daily. To be combined with 40 mg 01/18/20   Ursula Alert, MD  FLUoxetine (PROZAC) 40 MG capsule Take 1 capsule (40 mg total) by mouth daily. To be combined with 20 mg 01/18/20   Ursula Alert, MD  furosemide (LASIX) 40 MG tablet Take 40 mg by mouth daily as needed for fluid.  Patient not taking: Reported on 10/15/2019    [provider]  gabapentin (NEURONTIN) 300 MG capsule Take by mouth. 12/15/19 12/14/20  [provider]  glimepiride (AMARYL) 4 MG tablet Take 4 mg by mouth daily with breakfast.    [provider]  glucose blood (ONE TOUCH ULTRA TEST) test strip USE TO TEST BLOOD SUGAR TWO TIMES A DAY 12/07/14   [provider]  glucose blood test strip USE TO TEST BLOOD SUGAR TWO TIMES A DAY 12/07/14   [provider]  hydrOXYzine (VISTARIL) 50 MG capsule Take 1-2 capsules (50-100 mg total) by mouth daily as needed. For severe panic attacks only 01/18/20   Ursula Alert, MD  incobotulinumtoxinA (XEOMIN) 100 units SOLR injection Inject 100 Units into the muscle every 3 (three) months.  03/29/15   [provider]  Lancets (FREESTYLE) lancets Test daily before all meals/snacks 09/07/14   [provider]   lidocaine (LIDODERM) 5 % Place onto the skin as needed.  08/21/18   [provider]  lidocaine (LIDODERM) 5 % Place onto the skin. 12/15/19   [provider]  methylPREDNISolone (MEDROL DOSEPAK) 4 MG TBPK tablet  08/27/19   [provider]  Misc. Devices (ROLLATOR ULTRA-LIGHT) MISC Rollator with breaks. CPT [phone] or CPT [phone] 12/15/19   [provider]  mupirocin ointment (BACTROBAN) 2 % as needed.     [provider]  naloxone [facility]) nasal spray 4 mg/0.1 mL One spray in either nostril once for known/suspected opioid overdose. May repeat every 2-3 minutes in alternating nostril til EMS arrives 12/07/19   [provider]  nitrofurantoin (MACRODANTIN) 100 MG capsule  03/04/18   [provider]  nystatin (MYCOSTATIN/NYSTOP) powder Apply 1 g topically daily as needed (rash).  04/10/13   [provider]  omeprazole (PRILOSEC) 40 MG capsule Take 40 mg by mouth daily.     [provider]  oxyCODONE (OXY IR/ROXICODONE) 5 MG immediate release tablet Take 5 mg by mouth every 6 (six) hours as needed.  12/19/18   [provider]  oxyCODONE (OXY IR/ROXICODONE) 5 MG immediate release tablet Take by mouth. 01/13/20 03/13/20  [provider]  oxyCODONE (OXYCONTIN) 10 mg 12 hr tablet Take by mouth. 12/15/19   [provider]  oxyCODONE-acetaminophen (PERCOCET/ROXICET) 5-325 MG tablet oxycodone-acetaminophen 5 mg-325 mg tablet    [provider]  pioglitazone (ACTOS) 45 MG tablet Take 45 mg by mouth daily.    [provider]  polyethylene glycol (MIRALAX / GLYCOLAX) packet Take 17 gm mixed in liqiud as needed for constipation 09/28/15   [provider]  solifenacin (VESICARE) 10 MG tablet Take 10 mg by mouth daily.    [provider]  Tiotropium Bromide Monohydrate (SPIRIVA RESPIMAT) 1.25 MCG/ACT AERS Inhale 1 puff into the lungs daily.    [provider]  traZODone  (DESYREL) 100 MG tablet Take 1 tablet (100 mg total) by mouth at bedtime. 01/18/20   Ursula Alert, MD    Allergies Meloxicam, Morphine and related, Vantin [cefpodoxime], Latex, Baclofen, Ciprofloxacin, Tape, and Tramadol  Family History  Problem Relation Age of Onset  . CAD Father   . Heart attack Brother   . Sexual abuse Paternal Grandfather   . Breast cancer Neg Hx     Social History Social History   Tobacco Use  . Smoking status: Never Smoker  . Smokeless tobacco: Never Used  Vaping Use  . Vaping Use: Never used  Substance Use Topics  . Alcohol use: No  . Drug use: No    Review of Systems  Constitutional: No fever/chills.  Positive for dizziness and weakness. Eyes: No visual  changes. ENT: No sore throat. Cardiovascular: Denies chest pain. Respiratory: Denies shortness of breath. Gastrointestinal: No abdominal pain.  No nausea, no vomiting.  No diarrhea.  No constipation. Genitourinary: Negative for dysuria. Musculoskeletal: Negative for back pain. Skin: Negative for rash. Neurological: Negative for headaches, focal weakness or numbness.  ____________________________________________   PHYSICAL EXAM:  VITAL SIGNS: ED Triage Vitals  Enc Vitals Group     BP 02/19/20 1557 116/69     Pulse Rate 02/19/20 1557 81     Resp 02/19/20 1557 18     Temp 02/19/20 1557 98.6 F (37 C)     Temp Source 02/19/20 1557 Oral     SpO2 02/19/20 1557 98 %     Weight 02/19/20 1558 203 lb (92.1 kg)     Height 02/19/20 1558 5\' 2"  (1.575 m)     Head Circumference --      Peak Flow --      Pain Score --      Pain Loc --      Pain Edu? --      Excl. in [place]? --     Constitutional: Alert and oriented. Eyes: Conjunctivae are normal. Head: Atraumatic. Nose: No congestion/rhinnorhea. Mouth/Throat: Mucous membranes are moist. Neck: Normal ROM Cardiovascular: Normal rate, regular rhythm. Grossly normal heart sounds. Respiratory: Normal respiratory effort.  No retractions. Lungs  CTAB. Gastrointestinal: Soft and nontender. No distention. Genitourinary: deferred Musculoskeletal: No lower extremity tenderness nor edema.  Brace in place to left lower extremity. Neurologic:  Normal speech and language. No gross focal neurologic deficits are appreciated. Skin:  Skin is warm, dry and intact. No rash noted. Psychiatric: Mood and affect are normal. Speech and behavior are normal.  ____________________________________________   LABS (all labs ordered are listed, but only abnormal results are displayed)  Labs Reviewed  GLUCOSE, CAPILLARY - Abnormal; Notable for the following components:      Result Value   Glucose-Capillary 65 (*)    All other components within normal limits  BASIC METABOLIC PANEL - Abnormal; Notable for the following components:   Potassium 3.4 (*)    Glucose, Bld 68 (*)    All other components within normal limits  CBC - Abnormal; Notable for the following components:   RBC 5.39 (*)    All other components within normal limits  URINALYSIS, COMPLETE (UACMP) WITH MICROSCOPIC - Abnormal; Notable for the following components:   Color, Urine STRAW (*)    APPearance CLEAR (*)    Bacteria, UA RARE (*)    All other components within normal limits  GLUCOSE, CAPILLARY - Abnormal; Notable for the following components:   Glucose-Capillary 62 (*)    All other components within normal limits  GLUCOSE, CAPILLARY - Abnormal; Notable for the following components:   Glucose-Capillary 114 (*)    All other components within normal limits  GLUCOSE, CAPILLARY  GLUCOSE, CAPILLARY  CBG MONITORING, ED  CBG MONITORING, ED    PROCEDURES  Procedure(s) performed (including Critical Care):  Procedures   ____________________________________________   INITIAL IMPRESSION / ASSESSMENT AND PLAN / ED COURSE       49 year old female with past medical history of hypertension, diabetes, cerebral palsy, COPD, GERD, and asthma who presents to the ED complaining of  elevated glucose at 204 earlier at home, after which patient started feeling dizzy and weak.  She reports feeling better in the ED and is currently asymptomatic.  Recheck of blood glucose on arrival was in the 60s, but has now improved to 78.  We will have patient eat something and recheck blood glucose.  Remainder of lab work is unremarkable, no electrolyte abnormality noted.  If blood glucose remained stable, she would be appropriate for discharge home with PCP follow-up.  Repeat blood sugar is 114, patient remains asymptomatic here in the ED.  She is appropriate for discharge home with PCP follow-up, patient agrees with plan.      ____________________________________________   FINAL CLINICAL IMPRESSION(S) / ED DIAGNOSES  Final diagnoses:  Hyperglycemia     ED Discharge Orders    None       Note:  This document was prepared using Dragon voice recognition software and may include unintentional dictation errors.   Blake Divine, MD 02/19/20 1946

## 2020-02-19 NOTE — ED Triage Notes (Signed)
Pt comes into the ED via POV c/o hyperglycemia at 204.  Pt states she is type 2 and she states her CBG is normally around 115.  Pt c/o that she feels dizzy and weak compared to normal.   PT has increased thirst but denies any increased urination or nausea. Pt in NAD at this time with even and unlabored respirations.

## 2020-02-19 NOTE — ED Notes (Signed)
Pt provided lunch tray and apple juice.

## 2020-02-23 ENCOUNTER — Encounter: Payer: Medicare Other | Admitting: Physical Therapy

## 2020-02-25 ENCOUNTER — Encounter: Payer: Medicare Other | Admitting: Physical Therapy

## 2020-02-29 ENCOUNTER — Encounter: Payer: Medicare Other | Admitting: Physical Therapy

## 2020-03-02 ENCOUNTER — Ambulatory Visit: Payer: Medicare Other | Attending: Physical Medicine and Rehabilitation | Admitting: Physical Therapy

## 2020-03-02 ENCOUNTER — Other Ambulatory Visit: Payer: Self-pay

## 2020-03-02 DIAGNOSIS — G801 Spastic diplegic cerebral palsy: Secondary | ICD-10-CM | POA: Diagnosis present

## 2020-03-02 DIAGNOSIS — G8929 Other chronic pain: Secondary | ICD-10-CM | POA: Diagnosis present

## 2020-03-02 DIAGNOSIS — M6281 Muscle weakness (generalized): Secondary | ICD-10-CM | POA: Diagnosis present

## 2020-03-02 DIAGNOSIS — M545 Low back pain, unspecified: Secondary | ICD-10-CM | POA: Diagnosis present

## 2020-03-02 DIAGNOSIS — M25561 Pain in right knee: Secondary | ICD-10-CM | POA: Insufficient documentation

## 2020-03-02 DIAGNOSIS — R262 Difficulty in walking, not elsewhere classified: Secondary | ICD-10-CM

## 2020-03-02 DIAGNOSIS — R2681 Unsteadiness on feet: Secondary | ICD-10-CM

## 2020-03-02 DIAGNOSIS — R2689 Other abnormalities of gait and mobility: Secondary | ICD-10-CM

## 2020-03-02 DIAGNOSIS — M542 Cervicalgia: Secondary | ICD-10-CM

## 2020-03-02 NOTE — Therapy (Signed)
[facility] [address], [place], [address] Phone: [phone]   Fax:  [phone]  Physical Therapy Evaluation  Patient Details  Name: Traci Davis MRN: [id number] Date of Birth: 12/05/1970 Referring Provider (PT): Leretha Dykes, [place]   Encounter Date: 03/02/2020   PT End of Session - 03/03/20 1547    Visit Number 1    Number of Visits 24    Date for PT Re-Evaluation 05/25/20    Authorization Type [employer] MEDICARE reporting period from 03/02/2020    PT Start Time 1836    PT Stop Time 1944    PT Time Calculation (min) 68 min    Activity Tolerance Patient limited by fatigue    Behavior During Therapy [facility] for tasks assessed/performed           Past Medical History:  Diagnosis Date  . Anxiety   . Arthritis   . Asthma   . Cerebral palsy ([place])   . Complication of anesthesia    panic attacks before surgery  . COPD (chronic obstructive pulmonary disease) ([place])   . Depression   . Diabetes mellitus without complication ([place])   . GERD (gastroesophageal reflux disease)   . Hypertension     Past Surgical History:  Procedure Laterality Date  . CESAREAN SECTION  1995  . CHOLECYSTECTOMY    . DILATATION & CURETTAGE/HYSTEROSCOPY WITH MYOSURE N/A 02/20/2018   Procedure: DILATATION & CURETTAGE/HYSTEROSCOPY WITH MYOSURE ENDOMETRIAL POLYPECTOMY;  Surgeon: Will Bonnet, MD;  Location: [facility] ORS;  Service: Gynecology;  Laterality: N/A;  . ESOPHAGOGASTRODUODENOSCOPY (EGD) WITH PROPOFOL N/A 01/01/2019   Procedure: ESOPHAGOGASTRODUODENOSCOPY (EGD) WITH PROPOFOL;  Surgeon: Lin Landsman, MD;  Location: [facility] ENDOSCOPY;  Service: Gastroenterology;  Laterality: N/A;  . FOOT CAPSULE RELEASE W/ PERCUTANEOUS HEEL CORD LENGTHENING, TIBIAL TENDON TRANSFER     age 5 ,and 2010-both legs  . JOINT REPLACEMENT Right    both knees partial replacements dates unknown  . knee replacment     x 5 per pt. last one- left  knee 2014. has had 2 on R 3 on L  . TYMPANOPLASTY WITH GRAFT Right 01/08/2018   Procedure: TYMPANOPLASTY WITH POSSIBLE OSSICULAR CHAIN RECONSTRUCTION;  Surgeon: [name], MD;  Location: [facility] ORS;  Service: ENT;  Laterality: Right;    There were no vitals filed for this visit.    Subjective Assessment - 03/02/20 1912    Subjective Patient is known to this office and PT and is returning for further rehabilitation after discontinuing care about 7 months ago due to wanting to seek further medical assessment for various painful regions. She reports she had an knee MRI 2 weeks ago at a [facility] facility that shows a pulled hamstring at her distal R lateral hamstring. Review of documentation shows a radiograph report from [place] on 01/29/2020 that shows "Status post patellofemoral arthroplasty and medial femoral condyle interferent screw. Hardware is intact without evidence of new or increasing periprosthetic lucency. No acute fracture. No dislocation. No joint effusion. Mild arthrosis of the medial and lateral compartments with marginal osteophytosis. Included views of the contralateral left hip demonstrate degenerative changes as well has a patellofemoral arthroplasty with tibial cerclage wires. "  R knee non-contrast MRI report at [facility] from 05/12/2019 reports "1. Artifact from patient motion degrades image quality and limits sensitivity of this exam. 2. Postsurgical changes of patellofemoral arthroplasty and screw fixation of the medial femoral condyle for ligamentous reconstruction. Associated metal artifact limits regional evaluation. 3. Small  effusion with large intra-articular ossified body within the posterolateral femorotibial joint, measuring up to 17 mm. 4. Multifocal low-grade partial-thickness chondral loss throughout the central medial and lateral compartments. 5. Attenuation and laxity of the anterior cruciate ligament may represent a partial tear." Patient reports she has not done anything to injure this  region except participate in exercise last time she was at physical therapy and thinks the "pull" is from doing PT last time she was here. States she cannot straighten her R knee all the way out while sleeping. She would like to focus on the R hamstring at first if possible. Does admit to two falls in the last 6 months, one requiring EMS to assist her up. States she was unable to get her botox injection (she usually gets every 3 months) in the right hamstring region on Oct 28 due to the pulled hamstring. Dr. Clabe Seal from [place] who did the recent radiograph at [facility] gave her another referral for PT for diagnosis of primary OA of right knee and hamstring tendinitis that she brought in today as well. She denies any other major procedures or changes in health status since she was last here. Patient wears a KAFO with knee extension drop locks on the left LE for mobility and ambulates with a RW. She has a new RW that her boyfriend gave her but her R shoulder has been hurting more since she started using it and thinks it many need to be adjusted.    Pertinent History Patient is a 49 y.o. female who presents to outpatient physical therapy with a referral for medical diagnosis of spastic diplegic cerebral palsy, gait abnormality, right knee gives way, lumbar spondylosis, gait intability. This patient's chief complaints consist of distal R lateral hamstring "pull"/pain and difficulty walking as well as chronic pain throughout the body affecting R > L side leading to the following functional deficits: difficulty with basic ADLs, IADLs, community and household mobility, community participation, social participation with her family and friends, sleeping, getting around her home, quality of life Relevant past medical history and comorbidities include spastic diplegic CP, neck pain, back pain, R shoulder pain, R knee pain, anxiety, COPD, depression, diabetes mellitus; surgeries include: bilateral  partial knee replacement, hypertension, foot capsule release with percutaneous heel cord lengthening B as child, GERD, chronic pain, cholecystectomy, hysterectomy. Patient denies hx of cancer, stroke, seizures, major cardiac events, unexplained weight loss.    Limitations Lifting;House hold activities;Other (comment);Walking;Standing   sleeping   Diagnostic tests radiograph report from [place] on 01/29/2020 that shows "Status post patellofemoral arthroplasty and medial femoral condyle interferent screw. Hardware is intact without evidence of new or increasing periprosthetic lucency. No acute fracture. No dislocation. No joint effusion. Mild arthrosis of the medial and lateral compartments with marginal osteophytosis. Included views of the contralateral left hip demonstrate degenerative changes as well has a patellofemoral arthroplasty with tibial cerclage wires. "  R knee non-contrast MRI report at [facility] from 05/12/2019 reports "1. Artifact from patient motion degrades image quality and limits sensitivity of this exam. 2. Postsurgical changes of patellofemoral arthroplasty and screw fixation of the medial femoral condyle for ligamentous reconstruction. Associated metal artifact limits regional evaluation. 3. Small effusion with large intra-articular ossified body within the posterolateral femorotibial joint, measuring up to 17 mm. 4. Multifocal low-grade partial-thickness chondral loss throughout the central medial and lateral compartments. 5. Attenuation and laxity of the anterior cruciate ligament may represent a partial tear." Lumbar MRI report 01/10/2020: "IMPRESSION: 1. Minimal for  age noncompressive disc bulging at L2-3 and L3-4 without stenosis or neural impingement. 2. Mild to moderate bilateral facet hypertrophy at L2-3 through L4-5, which could contribute to underlying back pain. 3. Lower lumbar epidural lipomatosis. "    Patient Stated Goals get in and out of house and walk around house and yard.    Currently  in Pain? Yes    Pain Score 6    W: 8/10; B: 3/10   Pain Location --   everywhere except her arms and stomach. Worst pain from right side of neck down to her right foot, specifically reports pain significant near R distal thigh over hamstring tendon region   Pain Type Chronic pain    Pain Onset More than a month ago   October 2018   Pain Frequency Constant    Aggravating Factors  cold, doing too much/overload    Pain Relieving Factors diclofinac on legs, lidocain patches, pain medicatoin, rest, heat    Effect of Pain on Daily Activities difficulty with basic ADLs, IADLs, community and household mobility, community participation, social participation with her family and friends, sleeping, getting around her home, quality of life           [facility] PT Assessment - 03/03/20 0001      Assessment   Medical Diagnosis spastic diplegic cerebral palsy, gait abnormality, right knee gives way, lumbar spondylosis, gait intability    Referring Provider (PT) Leretha Dykes, DO    Onset Date/Surgical Date --   chronic   Hand Dominance Right    Next MD Visit January    Prior Therapy previously discontinued due to wanting to pursue more evaluation of other problems.       Precautions   Precautions Fall    Required Braces or Orthoses --   uses left locking KAFO     Restrictions   Weight Bearing Restrictions No      Home Environment   Living Environment Private residence    Living Arrangements Spouse/significant other    Type of [place] One level    [place] - 2 wheels;Walker - standard;Bedside commode;Tub bench;Toilet riser;Hand held shower head;Wheelchair - power;Wheelchair - manual      Prior Function   Level of Independence Independent with household mobility with device;Independent with gait;Independent with transfers;Requires assistive device for independence;Needs assistance with ADLs;Needs assistance with homemaking    has a nurse that comes in daily (6 hrs a day)   Vocation On disability    Leisure Spending time with family outside.      Cognition   Overall Cognitive Status Within Functional Limits for tasks assessed   some inconsistencies with timeline, tests           OBJECTIVE  OBSERVATION/INSPECTION . Posture: forward head, rounded shoulders, slumped in sitting. B knee flexion contracture L > R. KAFO on L LE with drop lock for extension at knee.  Mildly crouched posture in standing with B knee flexion contractures and dependent on RW for balance. L LE appears slightly longer than R.  . Tremor: none . Muscle bulk: generally overall mildly decreased consistent with lack of regular adequate exercise or physical activity.  . Skin: appears WFL . Bed mobility: supine <> sit and rolling mod I with increased time  . Transfers: sit <> stand from 18.5 inch surface mod I with BUE support to RW for stability.  . Gait: ambulates up to 100 feet  in clinic or out to car with RW and L KAFO with supervision. Both feet drag at times, excessive IR at the hip on right side. Difficulty clearing left LE in swing phase related to R LE appearing shorter than L LE and L LE locked in extension while ambulating. Heavy reliance on B UE. Improved mechanics with RW adjusted to proper height.    PERIPHERAL JOINT MOTION (in degrees)  Passive Range of Motion (PROM) *Indicates pain 03/02/20 Date Date  Joint/Motion R/L R/L R/L  Knee     Extension -5*/-15 / /  Flexoin 115*/93 / /  Comments: B hips WFL with more R hip ER/IR than left. L ankle not tested but functionally static at neutral DF while in KAFO. R ankle PROM [facility] for basic mobility.    MUSCLE PERFORMANCE (MMT):  *Indicates pain 03/02/20 Date Date  Joint/Motion R/L R/L R/L  Hip     Flexion 3/3 / /  Abduction 4/2 / /  Knee     Extension 4+/2 / /  Flexion 3/2- / /  Ankle/Foot     Dorsiflexion  3/ / /  Eversion 3-/ / /  Plantarflexion 3/ / /  Comments: B hip  extension at at least 4/5  FUNCTIONAL/BALANCE TESTS:  Six Minute Walk Test (6MWT): attempted but discontinued due to need to adjust walker height and concerns over causing severe sorenes later due to attempting to ambulate further than she has for months.   Two Minute Walk Test (2MWT): 73 feet with RW and SBA   EDUCATION/COGNITION: Patient is alert and oriented X 4. Does seem to have some inconsistencies in recall about recent tests she has had and the results.   Objective measurements completed on examination: See above findings.      PT Education - 03/03/20 1547    Education Details Exercise purpose/form. Self management techniques. Education on diagnosis, prognosis, POC, anatomy and physiology of current condition    Person(s) Educated Patient    Methods Explanation;Demonstration;Tactile cues;Verbal cues    Comprehension Verbalized understanding;Returned demonstration;Verbal cues required;Tactile cues required;Need further instruction            PT Short Term Goals - 03/03/20 1604      PT SHORT TERM GOAL #1   Title Be independent with initial home exercise program for self-management of symptoms    Baseline too be initiated at visit 2 as appropriate (03/02/2020):    Time 3    Period Weeks    Status New    Target Date 03/24/20             PT Long Term Goals - 03/03/20 1943      PT LONG TERM GOAL #1   Title Be independent with a long-term home exercise program for self-management of symptoms.    Baseline Initial HEP to be establised at visit 2 as appropriate (03/02/2020);    Time 12    Period Weeks    Status New   TARGET DATE FOR ALL LONG TERM GOALS: 05/25/2020     PT LONG TERM GOAL #2   Title Demonstrate improved FOTO score by 10 units to demonstrate improvement in overall condition and self-reported functional ability.    Baseline to be taken at visit 2 (03/02/2020);    Time 12    Period Weeks    Status New      PT LONG TERM GOAL #3   Title Reduce pain with  functional activities to equal or less than 3/10 to allow patient to  complete usual activities including ADLs, IADLs, and social engagement with less difficulty.    Baseline 8/10 (03/02/2020);    Time 12    Period Weeks    Status New      PT LONG TERM GOAL #4   Title Ambulate equal or greater than 600 feet in 6 Minute Walk Test to demonstrate improved community and household mobility.    Baseline 2 min walk test 73 feet (6MWT deferred due to needing to stop to adjust height of RW to decrease pain, 03/02/2020);    Time 12    Period Weeks    Status New      PT LONG TERM GOAL #5   Title Complete community, work and/or recreational activities with 50% decrease in limitation due to current condition.    Baseline Difficulty with basic ADLs, IADLs, community and household mobility, community participation, social participation with her family and friends, sleeping, quality of life (03/02/2020);    Time 12    Period Weeks    Status New                  Plan - 03/03/20 2001    Clinical Impression Statement Patient is a 49 y.o. female referred to outpatient physical therapy with a medical diagnosis of spastic diplegic cerebral palsy, gait abnormality, right knee gives way, lumbar spondylosis, gait intability who presents with signs and symptoms consistent with spastic diplegic cerebral palsy with widespread chronic pain that especially bothers her at her right knee at the distal biceps femoris. Also has significant pain at the neck and low back. Pain likely has multifactorial contributors and patient may be limited by her focus on pathoanatomic explanations and solutions for her pain. Will continue to assess for and address all factors contributing to pain as appropriate and with appropriate referrals as needed. Patient presents with significant gait, tone, balance, ROM, joint stiffness, activity tolerance, muscle performance (power/strength/endurance), coping skill impairments that are limiting  ability to complete her usual activities including  basic ADLs, IADLs, community and household mobility, community participation, social participation with her family and friends, sleeping, quality of life without difficulty. Patient will benefit from skilled physical therapy intervention to address current body structure impairments and activity limitations to improve function and work towards goals set in current POC in order to return to prior level of function or maximal functional improvement    Personal Factors and Comorbidities Age;Behavior Pattern;Comorbidity 3+;Education;Social Background;Past/Current Experience;Fitness;Time since onset of injury/illness/exacerbation    Comorbidities Relevant past medical history and comorbidities include spastic diplegic CP, neck pain, back pain, R shoulder pain, R knee pain, anxiety, COPD, depression, diabetes mellitus; surgeries include: bilateral partial knee replacement, hypertension, foot capsule release with percutaneous heel cord lengthening B as child, GERD, chronic pain, cholecystectomy, hysterectomy.    Examination-Activity Limitations Bathing;Hygiene/Grooming;Squat;Bed Mobility;Lift;Stairs;Bend;Locomotion Level;Stand;Caring for Others;Carry;Dressing;Sleep;Transfers    Examination-Participation Restrictions Laundry;Cleaning;Shop;Community Activity;Interpersonal Relationship    Stability/Clinical Decision Making Evolving/Moderate complexity    Clinical Decision Making Moderate    Rehab Potential Good    PT Frequency 2x / week    PT Duration 12 weeks    PT Treatment/Interventions Cryotherapy;Moist Heat;Therapeutic exercise;Patient/family education;Neuromuscular re-education;Manual techniques;ADLs/Self Care Home Management;Aquatic Therapy;Electrical Stimulation;Functional mobility training;Stair training;Gait training;DME Instruction;Therapeutic activities;Balance training;Passive range of motion;Dry needling;Joint Manipulations;Spinal Manipulations;Energy  conservation    PT Next Visit Plan establish HEP, complete FOTO    PT Home Exercise Plan to be established at visit 2 as appropriate    Consulted and Agree with Plan of Care Patient  Patient will benefit from skilled therapeutic intervention in order to improve the following deficits and impairments:  Impaired perceived functional ability, Decreased strength, Decreased range of motion, Increased muscle spasms, Pain, Abnormal gait, Decreased knowledge of use of DME, Increased fascial restricitons, Improper body mechanics, Cardiopulmonary status limiting activity, Decreased coordination, Decreased mobility, Impaired tone, Decreased endurance, Decreased activity tolerance, Hypomobility, Decreased balance, Difficulty walking, Impaired flexibility, Obesity  Visit Diagnosis: 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(HCC)  Chronic pain of right knee  Chronic bilateral low back pain, unspecified whether sciatica present  Other abnormalities of gait and mobility  Difficulty in walking, not elsewhere classified  Unsteadiness on feet  Muscle weakness (generalized)  Cervicalgia     Problem List Patient Active Problem List   Diagnosis Date Noted  . MDD (major depressive disorder), recurrent, in full remission ([place]) 08/12/2019  . Seizure-like activity ([place]) 07/08/2019  . MDD (major depressive disorder), recurrent episode, mild ([place]) 12/29/2018  . Insomnia due to mental condition 12/29/2018  . Disorder of vein 03/04/2018  . Lumbar sprain 03/04/2018  . Muscle weakness 03/04/2018  . Neck sprain 03/04/2018  . 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  . Postmenopausal bleeding 02/18/2018  . Impingement syndrome of shoulder region 02/04/2018  . Chest pain with moderate risk for cardiac etiology 05/19/2017  . History of depression 04/15/2017  . GAD (generalized anxiety disorder) 04/15/2017  . Chronic daily headache 04/15/2017  . Panic attack 04/15/2017  . Nocturnal enuresis 04/15/2017  . Mild  cognitive impairment 04/15/2017  . Loss of memory 01/14/2017  . Pain medication agreement signed 01/08/2017  . Headache disorder 12/04/2016  . Chronic, continuous use of opioids 07/01/2015  . Vertigo 07/01/2015  . Chronic midline low back pain without sciatica 03/21/2015  . Acute renal failure (ARF) ([place]) 02/19/2015  . Type 2 diabetes mellitus without complication ([place]) 30/94/0768  . Acute pancreatitis 09/04/2014  . Cerebral palsy ([place]) 01/05/2014  . 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 Hip pain, chronic 04/28/2013  . Essential hypertension 01/10/2013  . Irritable colon 01/10/2013  . Noninfectious gastroenteritis and colitis 01/10/2013  . Encounter for long-term (current) use of other medications 12/04/2012  . Chronic GERD 08/12/2012  . Epigastric pain 08/12/2012  . Diarrhea 08/12/2012  . Primary localized osteoarthrosis, lower leg 05/07/2012  . Difficulty walking 02/06/2012    Everlean Alstrom. Graylon Good, PT, DPT 03/03/20, 8:05 PM  [facility] [address] [place], [address] Phone: [phone]   Fax:  [phone]  Name: Traci Davis MRN: [id number] Date of Birth: [birth year]

## 2020-03-03 ENCOUNTER — Encounter: Payer: Medicare Other | Admitting: Physical Therapy

## 2020-03-03 ENCOUNTER — Encounter: Payer: Self-pay | Admitting: Physical Therapy

## 2020-03-08 ENCOUNTER — Encounter: Payer: Medicare Other | Admitting: Physical Therapy

## 2020-03-08 ENCOUNTER — Ambulatory Visit: Payer: Medicare Other | Admitting: Physical Therapy

## 2020-03-10 ENCOUNTER — Other Ambulatory Visit: Payer: Self-pay

## 2020-03-10 ENCOUNTER — Ambulatory Visit: Payer: Medicare Other | Admitting: Physical Therapy

## 2020-03-10 ENCOUNTER — Encounter: Payer: Self-pay | Admitting: Physical Therapy

## 2020-03-10 ENCOUNTER — Encounter: Payer: Medicare Other | Admitting: Physical Therapy

## 2020-03-10 DIAGNOSIS — R262 Difficulty in walking, not elsewhere classified: Secondary | ICD-10-CM

## 2020-03-10 DIAGNOSIS — G8929 Other chronic pain: Secondary | ICD-10-CM

## 2020-03-10 DIAGNOSIS — M6281 Muscle weakness (generalized): Secondary | ICD-10-CM

## 2020-03-10 DIAGNOSIS — M25561 Pain in right knee: Secondary | ICD-10-CM

## 2020-03-10 DIAGNOSIS — R2681 Unsteadiness on feet: Secondary | ICD-10-CM

## 2020-03-10 DIAGNOSIS — M542 Cervicalgia: Secondary | ICD-10-CM

## 2020-03-10 DIAGNOSIS — G801 Spastic diplegic cerebral palsy: Secondary | ICD-10-CM | POA: Diagnosis not present

## 2020-03-10 DIAGNOSIS — M545 Low back pain, unspecified: Secondary | ICD-10-CM

## 2020-03-10 DIAGNOSIS — R2689 Other abnormalities of gait and mobility: Secondary | ICD-10-CM

## 2020-03-10 NOTE — Therapy (Signed)
[facility] [address] [place], [address] Phone: [phone]   Fax:  [phone]  Physical Therapy Treatment  Patient Details  Name: Traci Davis MRN: [id number] Date of Birth: Dec 22, 1970 Referring Provider (PT): Leretha Dykes, [place]   Encounter Date: 03/10/2020   PT End of Session - 03/10/20 1926    Visit Number 2    Number of Visits 24    Date for PT Re-Evaluation 05/25/20    Authorization Type [employer] MEDICARE reporting period from 03/02/2020    PT Start Time 1550    PT Stop Time 1629    PT Time Calculation (min) 39 min    Activity Tolerance Patient limited by fatigue    Behavior During Therapy [facility] for tasks assessed/performed           Past Medical History:  Diagnosis Date  . Anxiety   . Arthritis   . Asthma   . Cerebral palsy ([place])   . Complication of anesthesia    panic attacks before surgery  . COPD (chronic obstructive pulmonary disease) ([place])   . Depression   . Diabetes mellitus without complication ([place])   . GERD (gastroesophageal reflux disease)   . Hypertension     Past Surgical History:  Procedure Laterality Date  . CESAREAN SECTION  1995  . CHOLECYSTECTOMY    . DILATATION & CURETTAGE/HYSTEROSCOPY WITH MYOSURE N/A 02/20/2018   Procedure: DILATATION & CURETTAGE/HYSTEROSCOPY WITH MYOSURE ENDOMETRIAL POLYPECTOMY;  Surgeon: Will Bonnet, MD;  Location: [facility] ORS;  Service: Gynecology;  Laterality: N/A;  . ESOPHAGOGASTRODUODENOSCOPY (EGD) WITH PROPOFOL N/A 01/01/2019   Procedure: ESOPHAGOGASTRODUODENOSCOPY (EGD) WITH PROPOFOL;  Surgeon: Lin Landsman, MD;  Location: [facility] ENDOSCOPY;  Service: Gastroenterology;  Laterality: N/A;  . FOOT CAPSULE RELEASE W/ PERCUTANEOUS HEEL CORD LENGTHENING, TIBIAL TENDON TRANSFER     age 49 ,and 2010-both legs  . JOINT REPLACEMENT Right    both knees partial replacements dates unknown  . knee replacment     x 5 per pt. last one- left  knee 2014. has had 2 on R 3 on L  . TYMPANOPLASTY WITH GRAFT Right 01/08/2018   Procedure: TYMPANOPLASTY WITH POSSIBLE OSSICULAR CHAIN RECONSTRUCTION;  Surgeon: [name], MD;  Location: [facility] ORS;  Service: ENT;  Laterality: Right;    There were no vitals filed for this visit.   Subjective Assessment - 03/10/20 1556    Subjective Patient reports her pain is 5/10 in her bilateral upper trap region R > L. Low back and legs are "just a little sore from walking" but "other than that it's fine." She is concerned that manual therapy or stretching of the hamstrings will be painful or are what hurt her hamstring in the past.    Pertinent History Patient is a 49 y.o. female who presents to outpatient physical therapy with a referral for medical diagnosis of spastic diplegic cerebral palsy, gait abnormality, right knee gives way, lumbar spondylosis, gait intability. This patient's chief complaints consist of distal R lateral hamstring "pull"/pain and difficulty walking as well as chronic pain throughout the body affecting R > L side leading to the following functional deficits: difficulty with basic ADLs, IADLs, community and household mobility, community participation, social participation with her family and friends, sleeping, getting around her home, quality of life Relevant past medical history and comorbidities include spastic diplegic CP, neck pain, back pain, R shoulder pain, R knee pain, anxiety, COPD, depression, diabetes mellitus; surgeries include: bilateral partial knee replacement,  hypertension, foot capsule release with percutaneous heel cord lengthening B as child, GERD, chronic pain, cholecystectomy, hysterectomy. Patient denies hx of cancer, stroke, seizures, major cardiac events, unexplained weight loss.    Limitations Lifting;House hold activities;Other (comment);Walking;Standing   sleeping   Diagnostic tests radiograph report from [facility] on 01/29/2020 that shows "Status post patellofemoral  arthroplasty and medial femoral condyle interferent screw. Hardware is intact without evidence of new or increasing periprosthetic lucency. No acute fracture. No dislocation. No joint effusion. Mild arthrosis of the medial and lateral compartments with marginal osteophytosis. Included views of the contralateral left hip demonstrate degenerative changes as well has a patellofemoral arthroplasty with tibial cerclage wires. "  R knee non-contrast MRI report at [facility] from 05/12/2019 reports "1. Artifact from patient motion degrades image quality and limits sensitivity of this exam. 2. Postsurgical changes of patellofemoral arthroplasty and screw fixation of the medial femoral condyle for ligamentous reconstruction. Associated metal artifact limits regional evaluation. 3. Small effusion with large intra-articular ossified body within the posterolateral femorotibial joint, measuring up to 17 mm. 4. Multifocal low-grade partial-thickness chondral loss throughout the central medial and lateral compartments. 5. Attenuation and laxity of the anterior cruciate ligament may represent a partial tear." Lumbar MRI report 01/10/2020: "IMPRESSION: 1. Minimal for age noncompressive disc bulging at L2-3 and L3-4 without stenosis or neural impingement. 2. Mild to moderate bilateral facet hypertrophy at L2-3 through L4-5, which could contribute to underlying back pain. 3. Lower lumbar epidural lipomatosis. "    Patient Stated Goals get in and out of house and walk around house and yard.    Currently in Pain? Yes    Pain Score 5     Pain Onset More than a month ago   October 2018         FOTO = 41 (03/10/2020)  TREATMENT:   Manual therapy: to reduce pain and tissue tension, improve range of motion, neuromodulation, in order to promote improved ability to complete functional activities. Supine with feet elevated:  - STM to bilateral upper traps. Very light to desensitize region.   Therapeutic exercise: to centralize symptoms  and improve ROM, strength, muscular endurance, and activity tolerance required for successful completion of functional activities.  - supine <> sit with min A  - seated shoulder shrugs 1x10 - seated scapular rows with red theraband, 3x10 - seated right knee long arc quad, 3x10 with 5# ankle weight.  - Education on HEP including handout   HOME EXERCISE PROGRAM Access Code: [email or web address] URL: [email or web address] Date: 03/10/2020 Prepared by: Rosita Kea  Exercises Seated Shoulder Shrug Circles AROM Backward - 1 x daily - 2 sets - 10 reps Seated Long Arc Quad - 1 x daily - 3 sets - 10 reps     PT Education - 03/10/20 1926    Education Details Exercise purpose/form. Self management techniques.    Person(s) Educated Patient    Methods Explanation;Demonstration;Tactile cues;Verbal cues    Comprehension Verbalized understanding;Returned demonstration;Verbal cues required;Tactile cues required;Need further instruction            PT Short Term Goals - 03/03/20 1604      PT SHORT TERM GOAL #1   Title Be independent with initial home exercise program for self-management of symptoms    Baseline too be initiated at visit 2 as appropriate (03/02/2020):    Time 3    Period Weeks    Status New    Target Date 03/24/20  PT Long Term Goals - 03/03/20 1943      PT LONG TERM GOAL #1   Title Be independent with a long-term home exercise program for self-management of symptoms.    Baseline Initial HEP to be establised at visit 2 as appropriate (03/02/2020);    Time 12    Period Weeks    Status New   TARGET DATE FOR ALL LONG TERM GOALS: 05/25/2020     PT LONG TERM GOAL #2   Title Demonstrate improved FOTO score by 10 units to demonstrate improvement in overall condition and self-reported functional ability.    Baseline to be taken at visit 2 (03/02/2020);    Time 12    Period Weeks    Status New      PT LONG TERM GOAL #3   Title Reduce pain with functional  activities to equal or less than 3/10 to allow patient to complete usual activities including ADLs, IADLs, and social engagement with less difficulty.    Baseline 8/10 (03/02/2020);    Time 12    Period Weeks    Status New      PT LONG TERM GOAL #4   Title Ambulate equal or greater than 600 feet in 6 Minute Walk Test to demonstrate improved community and household mobility.    Baseline 2 min walk test 73 feet (6MWT deferred due to needing to stop to adjust height of RW to decrease pain, 03/02/2020);    Time 12    Period Weeks    Status New      PT LONG TERM GOAL #5   Title Complete community, work and/or recreational activities with 50% decrease in limitation due to current condition.    Baseline Difficulty with basic ADLs, IADLs, community and household mobility, community participation, social participation with her family and friends, sleeping, quality of life (03/02/2020);    Time 12    Period Weeks    Status New                 Plan - 03/10/20 1933    Clinical Impression Statement Patient apprehensive today about PT as proposed interventions for the right hamstring including gentle manual therapy to decrease tension and sensitization to the region or gentle stretching for the same reasons.  Attention was turned to patient's upper back region due to it being the most bothersome problem for her today.  Patient reported mild improvement in upper back pain by end of session.  Patient provided with gentle home exercise program for bilateral lower extremities and improved posture.Patient would benefit from continued management of limiting condition by skilled physical therapist to address remaining impairments and functional limitations to work towards stated goals and return to PLOF or maximal functional independence.    Personal Factors and Comorbidities Age;Behavior Pattern;Comorbidity 3+;Education;Social Background;Past/Current Experience;Fitness;Time since onset of  injury/illness/exacerbation    Comorbidities Relevant past medical history and comorbidities include spastic diplegic CP, neck pain, back pain, R shoulder pain, R knee pain, anxiety, COPD, depression, diabetes mellitus; surgeries include: bilateral partial knee replacement, hypertension, foot capsule release with percutaneous heel cord lengthening B as child, GERD, chronic pain, cholecystectomy, hysterectomy.    Examination-Activity Limitations Bathing;Hygiene/Grooming;Squat;Bed Mobility;Lift;Stairs;Bend;Locomotion Level;Stand;Caring for Others;Carry;Dressing;Sleep;Transfers    Examination-Participation Restrictions Laundry;Cleaning;Shop;Community Activity;Interpersonal Relationship    Stability/Clinical Decision Making Evolving/Moderate complexity    Rehab Potential Good    PT Frequency 2x / week    PT Duration 12 weeks    PT Treatment/Interventions Cryotherapy;Moist Heat;Therapeutic exercise;Patient/family education;Neuromuscular re-education;Manual techniques;ADLs/Self Care Home Management;Aquatic Therapy;Dealer  Stimulation;Functional mobility training;Stair training;Gait training;DME Instruction;Therapeutic activities;Balance training;Passive range of motion;Dry needling;Joint Manipulations;Spinal Manipulations;Energy conservation    PT Next Visit Plan [id number]    PT Home Exercise Plan Medbridge Access Code: [email or web address]    Consulted and Agree with Plan of Care Patient           Patient will benefit from skilled therapeutic intervention in order to improve the following deficits and impairments:  Impaired perceived functional ability, Decreased strength, Decreased range of motion, Increased muscle spasms, Pain, Abnormal gait, Decreased knowledge of use of DME, Increased fascial restricitons, Improper body mechanics, Cardiopulmonary status limiting activity, Decreased coordination, Decreased mobility, Impaired tone, Decreased endurance, Decreased activity tolerance, Hypomobility, Decreased balance,  Difficulty walking, Impaired flexibility, Obesity  Visit Diagnosis: 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(HCC)  Chronic pain of right knee  Chronic bilateral low back pain, unspecified whether sciatica present  Other abnormalities of gait and mobility  Difficulty in walking, not elsewhere classified  Unsteadiness on feet  Muscle weakness (generalized)  Cervicalgia     Problem List Patient Active Problem List   Diagnosis Date Noted  . MDD (major depressive disorder), recurrent, in full remission ([place]) 08/12/2019  . Seizure-like activity ([place]) 07/08/2019  . MDD (major depressive disorder), recurrent episode, mild ([place]) 12/29/2018  . Insomnia due to mental condition 12/29/2018  . Disorder of vein 03/04/2018  . Lumbar sprain 03/04/2018  . Muscle weakness 03/04/2018  . Neck sprain 03/04/2018  . 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  . Postmenopausal bleeding 02/18/2018  . Impingement syndrome of shoulder region 02/04/2018  . Chest pain with moderate risk for cardiac etiology 05/19/2017  . History of depression 04/15/2017  . GAD (generalized anxiety disorder) 04/15/2017  . Chronic daily headache 04/15/2017  . Panic attack 04/15/2017  . Nocturnal enuresis 04/15/2017  . Mild cognitive impairment 04/15/2017  . Loss of memory 01/14/2017  . Pain medication agreement signed 01/08/2017  . Headache disorder 12/04/2016  . Chronic, continuous use of opioids 07/01/2015  . Vertigo 07/01/2015  . Chronic midline low back pain without sciatica 03/21/2015  . Acute renal failure (ARF) ([place]) 02/19/2015  . Type 2 diabetes mellitus without complication ([place]) 34/19/3790  . Acute pancreatitis 09/04/2014  . Cerebral palsy ([place]) 01/05/2014  . 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 Hip pain, chronic 04/28/2013  . Essential hypertension 01/10/2013  . Irritable colon 01/10/2013  . Noninfectious gastroenteritis and colitis 01/10/2013  . Encounter for long-term (current) use of other  medications 12/04/2012  . Chronic GERD 08/12/2012  . Epigastric pain 08/12/2012  . Diarrhea 08/12/2012  . Primary localized osteoarthrosis, lower leg 05/07/2012  . Difficulty walking 02/06/2012    Everlean Alstrom. Graylon Good, PT, DPT 03/10/20, 7:34 PM  [facility] [address] [place], [address] Phone: [phone]   Fax:  [phone]  Name: KATHERINE TOUT MRN: [id number] Date of Birth: Mar 26, 1971

## 2020-03-14 ENCOUNTER — Ambulatory Visit: Payer: Medicare Other | Admitting: Physical Therapy

## 2020-03-16 ENCOUNTER — Ambulatory Visit: Payer: Medicare Other | Admitting: Physical Therapy

## 2020-03-22 ENCOUNTER — Other Ambulatory Visit: Payer: Self-pay

## 2020-03-22 ENCOUNTER — Encounter: Payer: Self-pay | Admitting: Psychiatry

## 2020-03-22 ENCOUNTER — Telehealth (INDEPENDENT_AMBULATORY_CARE_PROVIDER_SITE_OTHER): Payer: Medicare Other | Admitting: Psychiatry

## 2020-03-22 DIAGNOSIS — F411 Generalized anxiety disorder: Secondary | ICD-10-CM

## 2020-03-22 DIAGNOSIS — F3342 Major depressive disorder, recurrent, in full remission: Secondary | ICD-10-CM

## 2020-03-22 DIAGNOSIS — J449 Chronic obstructive pulmonary disease, unspecified: Secondary | ICD-10-CM | POA: Insufficient documentation

## 2020-03-22 DIAGNOSIS — G4701 Insomnia due to medical condition: Secondary | ICD-10-CM | POA: Diagnosis not present

## 2020-03-22 MED ORDER — TRAZODONE HCL 100 MG PO TABS: 150.0000 mg | ORAL_TABLET | Freq: Every day | ORAL | 1 refills | Status: DC

## 2020-03-22 NOTE — Progress Notes (Signed)
Virtual Visit via Telephone Note  I connected with Traci Davis on 03/22/20 at  4:40 PM EST by telephone and verified that I am speaking with the correct person using two identifiers.  Location Provider Location : [facility] Patient Location : Home  Participants: Patient , Provider   I discussed the limitations, risks, security and privacy concerns of performing an evaluation and management service by telephone and the availability of in person appointments. I also discussed with the patient that there may be a patient responsible charge related to this service. The patient expressed understanding and agreed to proceed.   I discussed the assessment and treatment plan with the patient. The patient was provided an opportunity to ask questions and all were answered. The patient agreed with the plan and demonstrated an understanding of the instructions.   The patient was advised to call back or seek an in-person evaluation if the symptoms worsen or if the condition fails to improve as anticipated.   [place] MD OP Progress Note  03/22/2020 5:22 PM Traci Davis  MRN:  [id number]  Chief Complaint:  Chief Complaint    Follow-up     HPI: Traci Davis is a 49 year old Caucasian female, lives in [place], on [place], has a history of MDD, GAD, insomnia, cerebral palsy, diabetes melitis was evaluated by phone today.  Patient today reports she is currently struggling with a lot of back pain.  She reports she had several lidocaine injections done however she continues to be in pain.  The pain does affect her sleep.  She has to find a comfortable position to sleep and that takes a while.  She is currently taking trazodone 150 mg.  Patient reports she is anxious about her pain however is coping okay.  Her mood symptoms are currently stable.  Patient denies any suicidality, homicidality or perceptual disturbances.  Patient denies any other concerns today.  Visit Diagnosis:    ICD-10-CM   1. MDD (major  depressive disorder), recurrent, in full remission ([place])  F33.42 traZODone (DESYREL) 100 MG tablet  2. GAD (generalized anxiety disorder)  F41.1   3. Insomnia due to medical condition  G47.01    pain    Past Psychiatric History: I have reviewed past psychiatric history from my progress note on 09/12/2017.  Past trials of Xanax, Klonopin, Cymbalta, Rozerem-costly  Past Medical History:  Past Medical History:  Diagnosis Date  . Anxiety   . Arthritis   . Asthma   . Cerebral palsy ([place])   . Complication of anesthesia    panic attacks before surgery  . COPD (chronic obstructive pulmonary disease) ([place])   . Depression   . Diabetes mellitus without complication ([place])   . GERD (gastroesophageal reflux disease)   . Hypertension     Past Surgical History:  Procedure Laterality Date  . CESAREAN SECTION  1995  . CHOLECYSTECTOMY    . DILATATION & CURETTAGE/HYSTEROSCOPY WITH MYOSURE N/A 02/20/2018   Procedure: DILATATION & CURETTAGE/HYSTEROSCOPY WITH MYOSURE ENDOMETRIAL POLYPECTOMY;  Surgeon: Will Bonnet, MD;  Location: [facility] ORS;  Service: Gynecology;  Laterality: N/A;  . ESOPHAGOGASTRODUODENOSCOPY (EGD) WITH PROPOFOL N/A 01/01/2019   Procedure: ESOPHAGOGASTRODUODENOSCOPY (EGD) WITH PROPOFOL;  Surgeon: Lin Landsman, MD;  Location[facility] ENDOSCOPY;  Service: Gastroenterology;  Laterality: N/A;  . FOOT CAPSULE RELEASE W/ PERCUTANEOUS HEEL CORD LENGTHENING, TIBIAL TENDON TRANSFER     age 41 ,and 2010-both legs  . JOINT REPLACEMENT Right    both knees partial replacements dates unknown  . knee replacment  x 5 per pt. last one- left knee 2014. has had 2 on R 3 on L  . TYMPANOPLASTY WITH GRAFT Right 01/08/2018   Procedure: TYMPANOPLASTY WITH POSSIBLE OSSICULAR CHAIN RECONSTRUCTION;  Surgeon: [name], MD;  Location: [facility] ORS;  Service: ENT;  Laterality: Right;    Family Psychiatric History: I have reviewed family psychiatric history from my progress note on 09/12/2017.  Family  History:  Family History  Problem Relation Age of Onset  . CAD Father   . Heart attack Brother   . Sexual abuse Paternal Grandfather   . Breast cancer Neg Hx     Social History: I have reviewed social history from my progress note on 09/12/2017 Social History   Socioeconomic History  . Marital status: Single    Spouse name: Not on file  . Number of children: Not on file  . Years of education: Not on file  . Highest education level: Not on file  Occupational History  . Not on file  Tobacco Use  . Smoking status: Never Smoker  . Smokeless tobacco: Never Used  Vaping Use  . Vaping Use: Never used  Substance and Sexual Activity  . Alcohol use: No  . Drug use: No  . Sexual activity: Yes    Partners: Male    Birth control/protection: Condom  Other Topics Concern  . Not on file  Social History Narrative  . Not on file   Social Determinants of Health   Financial Resource Strain:   . Difficulty of Paying Living Expenses: Not on file  Food Insecurity:   . Worried About Charity fundraiser in the Last Year: Not on file  . Ran Out of Food in the Last Year: Not on file  Transportation Needs:   . Lack of Transportation (Medical): Not on file  . Lack of Transportation (Non-Medical): Not on file  Physical Activity:   . Days of Exercise per Week: Not on file  . Minutes of Exercise per Session: Not on file  Stress:   . Feeling of Stress : Not on file  Social Connections:   . Frequency of Communication with Friends and Family: Not on file  . Frequency of Social Gatherings with Friends and Family: Not on file  . Attends Religious Services: Not on file  . Active Member of Clubs or Organizations: Not on file  . Attends Archivist Meetings: Not on file  . Marital Status: Not on file    Allergies:  Allergies  Allergen Reactions  . Meloxicam Other (See Comments)    Damage kidney  . Morphine And Related Other (See Comments)    hallucinations  . Vantin [Cefpodoxime]  Nausea And Vomiting  . Latex Itching  . Baclofen Other (See Comments)    "makes cerebral palsy do adverse reaction on me", tightens muscles   . Ciprofloxacin Itching and Nausea And Vomiting  . Tape Rash    skin tears.  Paper tape is ok  . Tramadol Nausea Only    Metabolic Disorder Labs: Lab Results  Component Value Date   HGBA1C 6.1 (H) 02/19/2015   No results found for: PROLACTIN No results found for: CHOL, TRIG, HDL, CHOLHDL, VLDL, LDLCALC Lab Results  Component Value Date   TSH 2.198 07/10/2016   TSH 2.891 02/19/2015    Therapeutic Level Labs: No results found for: LITHIUM No results found for: VALPROATE No components found for:  CBMZ  Current Medications: Current Outpatient Medications  Medication Sig Dispense Refill  . albuterol (PROVENTIL  HFA;VENTOLIN HFA) 108 (90 BASE) MCG/ACT inhaler Inhale 2 puffs into the lungs 4 (four) times daily as needed. For shortness of breath and/or wheezing    . atorvastatin (LIPITOR) 20 MG tablet Take 20 mg by mouth daily.    . diazepam (VALIUM) 5 MG tablet Take by mouth.    . diclofenac sodium (VOLTAREN) 1 % GEL Apply 1 application topically as needed (apply to painful areas of chest wall). 100 g 0  . lidocaine (LIDODERM) 5 % Place onto the skin.     . metFORMIN (GLUCOPHAGE-XR) 500 MG 24 hr tablet Take by mouth.    Marland Kitchen acetaminophen (TYLENOL) 325 MG tablet Take 2 tablets (650 mg total) by mouth every 6 (six) hours as needed. (Patient not taking: Reported on 03/22/2020) 60 tablet 0  . ANORO ELLIPTA 62.5-25 MCG/INH AEPB     . Blood Glucose Monitoring Suppl (GLUCOCOM BLOOD GLUCOSE MONITOR) DEVI Test daily before all meals/snacks and once before bedtime.    Marland Kitchen buPROPion (WELLBUTRIN) 100 MG tablet Take 1 tablet (100 mg total) by mouth every morning. 90 tablet 1  . Chlorzoxazone 750 MG TABS Take by mouth in the morning and at bedtime. 1 1/2 tabs BID    . CIPRODEX OTIC suspension     . CREON 36000 units CPEP capsule     . diclofenac Sodium  (VOLTAREN) 1 % GEL Apply 1 application topically 4 (four) times daily. (Patient not taking: Reported on 10/15/2019)    . dicyclomine (BENTYL) 10 MG capsule Take 10 mg by mouth as needed.     . donepezil (ARICEPT) 10 MG tablet Take 10 mg by mouth at bedtime.     Marland Kitchen FLUoxetine (PROZAC) 20 MG capsule Take 1 capsule (20 mg total) by mouth daily. To be combined with 40 mg 90 capsule 1  . FLUoxetine (PROZAC) 40 MG capsule Take 1 capsule (40 mg total) by mouth daily. To be combined with 20 mg 90 capsule 1  . furosemide (LASIX) 40 MG tablet Take 40 mg by mouth daily as needed for fluid.     Marland Kitchen glimepiride (AMARYL) 4 MG tablet Take 4 mg by mouth daily with breakfast. (Patient not taking: Reported on 03/02/2020)    . glucose blood (ONE TOUCH ULTRA TEST) test strip USE TO TEST BLOOD SUGAR TWO TIMES A DAY    . glucose blood test strip USE TO TEST BLOOD SUGAR TWO TIMES A DAY    . hydrOXYzine (VISTARIL) 50 MG capsule Take 1-2 capsules (50-100 mg total) by mouth daily as needed. For severe panic attacks only 180 capsule 1  . incobotulinumtoxinA (XEOMIN) 100 units SOLR injection Inject 100 Units into the muscle every 3 (three) months.     . Lancets (FREESTYLE) lancets Test daily before all meals/snacks    . lidocaine (LIDODERM) 5 % Place onto the skin as needed.     . naloxone (NARCAN) nasal spray 4 mg/0.1 mL One spray in either nostril once for known/suspected opioid overdose. May repeat every 2-3 minutes in alternating nostril til EMS arrives (Patient not taking: Reported on 03/02/2020)    . nitrofurantoin (MACRODANTIN) 100 MG capsule     . nystatin (MYCOSTATIN/NYSTOP) powder Apply 1 g topically daily as needed (rash).     Marland Kitchen omeprazole (PRILOSEC) 40 MG capsule Take 40 mg by mouth daily.     Marland Kitchen oxyCODONE (OXY IR/ROXICODONE) 5 MG immediate release tablet Take 5 mg by mouth every 6 (six) hours as needed.     Marland Kitchen oxyCODONE ER (XTAMPZA ER)  9 MG C12A Take 9 mg by mouth daily.    . solifenacin (VESICARE) 10 MG tablet Take 10  mg by mouth daily.    . traZODone (DESYREL) 100 MG tablet Take 1.5-2 tablets (150-200 mg total) by mouth at bedtime. 135 tablet 1   No current facility-administered medications for this visit.     Musculoskeletal: Strength & Muscle Tone: UTA Gait & Station: UTA Patient leans: N/A  Psychiatric Specialty Exam: Review of Systems  Musculoskeletal: Positive for back pain.  Psychiatric/Behavioral: Positive for sleep disturbance. The patient is nervous/anxious.   All other systems reviewed and are negative.   Last menstrual period 08/20/2014.There is no height or weight on file to calculate BMI.  General Appearance: UTA  Eye Contact:  UTA  Speech:  Clear and Coherent  Volume:  Normal  Mood:  Anxious coping well  Affect:  UTA  Thought Process:  Goal Directed and Descriptions of Associations: Intact  Orientation:  Full (Time, Place, and Person)  Thought Content: Logical   Suicidal Thoughts:  No  Homicidal Thoughts:  No  Memory:  Immediate;   Fair Recent;   Fair Remote;   Fair  Judgement:  Fair  Insight:  Fair  Psychomotor Activity:  UTA  Concentration:  Concentration: Fair and Attention Span: Fair  Recall:  [employer] of Knowledge: Fair  Language: Fair  Akathisia:  No  Handed:  Right  AIMS (if indicated): UTA  Assets:  Communication Skills Desire for Improvement Housing Social Support  ADL's:  Intact  Cognition: WNL  Sleep:  Poor   Screenings:   Assessment and Plan: Traci Davis is a 49 year old Caucasian female who has a history of depression, GAD, multiple medical problems including pain was evaluated by phone today.  Patient is currently struggling with sleep problems more so because of her pain.  Plan as noted below.  Plan Depression in remission Prozac 60 mg p.o. daily Wellbutrin 100 mg p.o. daily in the morning  GAD-stable Prozac as prescribed Hydroxyzine as needed  Insomnia-unstable due to pain Increase trazodone to 150 to 200 mg p.o. nightly as  needed Patient will need sufficient pain management, she will talk to her pain provider.  Follow-up in clinic in 2 months or sooner if needed.  I have spent atleast 20 minutes non face to face with patient today. More than 50 % of the time was spent for preparing to see the patient ( e.g., review of test, records ),  ordering medications and test ,psychoeducation and supportive psychotherapy and care coordination,as well as documenting clinical information in electronic health record. This note was generated in part or whole with voice recognition software. Voice recognition is usually quite accurate but there are transcription errors that can and very often do occur. I apologize for any typographical errors that were not detected and corrected.        Ursula Alert, MD 03/23/2020, 8:26 AM

## 2020-03-23 ENCOUNTER — Ambulatory Visit: Payer: Medicare Other | Admitting: Physical Therapy

## 2020-03-23 ENCOUNTER — Encounter: Payer: Self-pay | Admitting: Physical Therapy

## 2020-03-23 DIAGNOSIS — M25561 Pain in right knee: Secondary | ICD-10-CM

## 2020-03-23 DIAGNOSIS — G8929 Other chronic pain: Secondary | ICD-10-CM

## 2020-03-23 DIAGNOSIS — R262 Difficulty in walking, not elsewhere classified: Secondary | ICD-10-CM

## 2020-03-23 DIAGNOSIS — G801 Spastic diplegic cerebral palsy: Secondary | ICD-10-CM | POA: Diagnosis not present

## 2020-03-23 DIAGNOSIS — M6281 Muscle weakness (generalized): Secondary | ICD-10-CM

## 2020-03-23 DIAGNOSIS — M545 Low back pain, unspecified: Secondary | ICD-10-CM

## 2020-03-23 DIAGNOSIS — R2689 Other abnormalities of gait and mobility: Secondary | ICD-10-CM

## 2020-03-23 DIAGNOSIS — R2681 Unsteadiness on feet: Secondary | ICD-10-CM

## 2020-03-23 DIAGNOSIS — M542 Cervicalgia: Secondary | ICD-10-CM

## 2020-03-23 NOTE — Therapy (Signed)
[facility] [address], [place], [address] Phone: [phone]   Fax:  [phone]  Physical Therapy Treatment  Patient Details  Name: Traci Davis MRN: [id number] Date of Birth: 08-19-1970 Referring Provider (PT): Leretha Dykes, [place]   Encounter Date: 03/23/2020   PT End of Session - 03/23/20 1757    Visit Number 3    Number of Visits 24    Date for PT Re-Evaluation 05/25/20    Authorization Type [employer] MEDICARE reporting period from 03/02/2020    PT Start Time 1324    PT Stop Time 1815    PT Time Calculation (min) 30 min    Activity Tolerance Patient limited by fatigue    Behavior During Therapy [facility] for tasks assessed/performed           Past Medical History:  Diagnosis Date  . Anxiety   . Arthritis   . Asthma   . Cerebral palsy ([place])   . Complication of anesthesia    panic attacks before surgery  . COPD (chronic obstructive pulmonary disease) ([place])   . Depression   . Diabetes mellitus without complication ([place])   . GERD (gastroesophageal reflux disease)   . Hypertension     Past Surgical History:  Procedure Laterality Date  . CESAREAN SECTION  1995  . CHOLECYSTECTOMY    . DILATATION & CURETTAGE/HYSTEROSCOPY WITH MYOSURE N/A 02/20/2018   Procedure: DILATATION & CURETTAGE/HYSTEROSCOPY WITH MYOSURE ENDOMETRIAL POLYPECTOMY;  Surgeon: Will Bonnet, MD;  Location: [facility] ORS;  Service: Gynecology;  Laterality: N/A;  . ESOPHAGOGASTRODUODENOSCOPY (EGD) WITH PROPOFOL N/A 01/01/2019   Procedure: ESOPHAGOGASTRODUODENOSCOPY (EGD) WITH PROPOFOL;  Surgeon: Lin Landsman, MD;  Location: [facility] ENDOSCOPY;  Service: Gastroenterology;  Laterality: N/A;  . FOOT CAPSULE RELEASE W/ PERCUTANEOUS HEEL CORD LENGTHENING, TIBIAL TENDON TRANSFER     age 49 ,and 2010-both legs  . JOINT REPLACEMENT Right    both knees partial replacements dates unknown  . knee replacment     x 5 per pt. last one- left  knee 2014. has had 2 on R 3 on L  . TYMPANOPLASTY WITH GRAFT Right 01/08/2018   Procedure: TYMPANOPLASTY WITH POSSIBLE OSSICULAR CHAIN RECONSTRUCTION;  Surgeon: [name], MD;  Location: [facility] ORS;  Service: ENT;  Laterality: Right;    There were no vitals filed for this visit.   Subjective Assessment - 03/23/20 1749    Subjective Patient arrived 15 min late, stating interstate was moving very slowly. Patient reports she is having a lot of pain in the right low back and glute region. She reports the pain there is 7/10. She states no one can touch it without her screaming. She is afraid to have PT touch that region and declined suggestion of manual therapy. She has missed several PT visits due to pain in her back. She is afraid to get injections. She reports there was no apparent reason she started having flair of back pain. States she would like her appointments to be no earlier than 4:30 because her nurse stays until then now. States her hamstring has been a little better. Arrived using RW and L drop lock KAFO    Pertinent History Patient is a 49 y.o. female who presents to outpatient physical therapy with a referral for medical diagnosis of spastic diplegic cerebral palsy, gait abnormality, right knee gives way, lumbar spondylosis, gait intability. This patient's chief complaints consist of distal R lateral hamstring "pull"/pain and difficulty walking as well as chronic  pain throughout the body affecting R > L side leading to the following functional deficits: difficulty with basic ADLs, IADLs, community and household mobility, community participation, social participation with her family and friends, sleeping, getting around her home, quality of life Relevant past medical history and comorbidities include spastic diplegic CP, neck pain, back pain, R shoulder pain, R knee pain, anxiety, COPD, depression, diabetes mellitus; surgeries include: bilateral partial knee replacement, hypertension, foot capsule  release with percutaneous heel cord lengthening B as child, GERD, chronic pain, cholecystectomy, hysterectomy. Patient denies hx of cancer, stroke, seizures, major cardiac events, unexplained weight loss.    Limitations Lifting;House hold activities;Other (comment);Walking;Standing   sleeping   Diagnostic tests radiograph report from [facility] on 01/29/2020 that shows "Status post patellofemoral arthroplasty and medial femoral condyle interferent screw. Hardware is intact without evidence of new or increasing periprosthetic lucency. No acute fracture. No dislocation. No joint effusion. Mild arthrosis of the medial and lateral compartments with marginal osteophytosis. Included views of the contralateral left hip demonstrate degenerative changes as well has a patellofemoral arthroplasty with tibial cerclage wires. "  R knee non-contrast MRI report at [facility] from 05/12/2019 reports "1. Artifact from patient motion degrades image quality and limits sensitivity of this exam. 2. Postsurgical changes of patellofemoral arthroplasty and screw fixation of the medial femoral condyle for ligamentous reconstruction. Associated metal artifact limits regional evaluation. 3. Small effusion with large intra-articular ossified body within the posterolateral femorotibial joint, measuring up to 17 mm. 4. Multifocal low-grade partial-thickness chondral loss throughout the central medial and lateral compartments. 5. Attenuation and laxity of the anterior cruciate ligament may represent a partial tear." Lumbar MRI report 01/10/2020: "IMPRESSION: 1. Minimal for age noncompressive disc bulging at L2-3 and L3-4 without stenosis or neural impingement. 2. Mild to moderate bilateral facet hypertrophy at L2-3 through L4-5, which could contribute to underlying back pain. 3. Lower lumbar epidural lipomatosis. "    Patient Stated Goals get in and out of house and walk around house and yard.    Currently in Pain? Yes    Pain Score 7     Pain Location  Back    Pain Orientation Right    Pain Onset More than a month ago   October 2018          TREATMENT:   Therapeutic exercise:to centralize symptoms and improve ROM, strength, muscular endurance, and activity tolerance required for successful completion of functional activities.  - supine <> sit with min A  - hooklying lower trunk rotation x 20 each direction - posterior pelvic tilt (PPT), 1x20, 1x5 with self palpation at ASIS to help feel correct way to do it independently. Extra time and reps to learn how to move correct direction.  - PPT with marching x 10 each side (very difficult and required max encouragement).  - Education on HEP including handout   HOME EXERCISE PROGRAM Access Code: [email or web address] URL: [email or web address] Date: 03/23/2020 Prepared by: Rosita Kea  Exercises Seated Shoulder Shrug Circles AROM Backward - 1 x daily - 2 sets - 10 reps Seated Long Arc Quad - 1 x daily - 3 sets - 10 reps Supine Posterior Pelvic Tilt - 1 x daily - 20 reps    PT Education - 03/23/20 1757    Education Details Exercise purpose/form. Self management techniques.    Person(s) Educated Patient    Methods Explanation    Comprehension Verbalized understanding;Returned demonstration;Verbal cues required;Tactile cues required;Need further instruction  PT Short Term Goals - 03/23/20 1832      PT SHORT TERM GOAL #1   Title Be independent with initial home exercise program for self-management of symptoms    Baseline too be initiated at visit 2 as appropriate (03/02/2020):    Time 3    Period Weeks    Status Achieved    Target Date 03/24/20             PT Long Term Goals - 03/03/20 1943      PT LONG TERM GOAL #1   Title Be independent with a long-term home exercise program for self-management of symptoms.    Baseline Initial HEP to be establised at visit 2 as appropriate (03/02/2020);    Time 12    Period Weeks    Status New   TARGET DATE FOR ALL LONG  TERM GOALS: 05/25/2020     PT LONG TERM GOAL #2   Title Demonstrate improved FOTO score by 10 units to demonstrate improvement in overall condition and self-reported functional ability.    Baseline to be taken at visit 2 (03/02/2020);    Time 12    Period Weeks    Status New      PT LONG TERM GOAL #3   Title Reduce pain with functional activities to equal or less than 3/10 to allow patient to complete usual activities including ADLs, IADLs, and social engagement with less difficulty.    Baseline 8/10 (03/02/2020);    Time 12    Period Weeks    Status New      PT LONG TERM GOAL #4   Title Ambulate equal or greater than 600 feet in 6 Minute Walk Test to demonstrate improved community and household mobility.    Baseline 2 min walk test 73 feet (6MWT deferred due to needing to stop to adjust height of RW to decrease pain, 03/02/2020);    Time 12    Period Weeks    Status New      PT LONG TERM GOAL #5   Title Complete community, work and/or recreational activities with 50% decrease in limitation due to current condition.    Baseline Difficulty with basic ADLs, IADLs, community and household mobility, community participation, social participation with her family and friends, sleeping, quality of life (03/02/2020);    Time 12    Period Weeks    Status New                 Plan - 03/23/20 1831    Clinical Impression Statement Patient appeared to tolerate treatment well with some complaint of increased R low back pain at end range LTR with legs to the right and left sided low back discomfort with posterior pelvic tilt that was no worse once she moved out of the position. Patient declined any manual therapy to the lumbar spine even after PT explained desensitization benefits that it could have. Patient did agree to do some exercises as an alternate treatment option. Moved slowly and required heavy to max tactile and verbal cuing for posterior pelvic tilt with and without marching and low  trunk rotation. Required frequent cuing to start exercise again after stopping after every one to two reps for marching exercise. Stated it was "too hard" for her brain. Did have difficulty activating abdominal muscles and could only partially hold the tilt with march. Did not complain of increased R back pain with marching. Decided it was too far to ambulate to the bathroom at the other side  of the gym but ambulated mod I with RW into clinic and out to car, declining any assistance. Drove herself here and mentioned interstate traffic was very bad. Patient would benefit from continued management of limiting condition by skilled physical therapist to address remaining impairments and functional limitations to work towards stated goals and return to PLOF or maximal functional independence.    Personal Factors and Comorbidities Age;Behavior Pattern;Comorbidity 3+;Education;Social Background;Past/Current Experience;Fitness;Time since onset of injury/illness/exacerbation    Comorbidities Relevant past medical history and comorbidities include spastic diplegic CP, neck pain, back pain, R shoulder pain, R knee pain, anxiety, COPD, depression, diabetes mellitus; surgeries include: bilateral partial knee replacement, hypertension, foot capsule release with percutaneous heel cord lengthening B as child, GERD, chronic pain, cholecystectomy, hysterectomy.    Examination-Activity Limitations Bathing;Hygiene/Grooming;Squat;Bed Mobility;Lift;Stairs;Bend;Locomotion Level;Stand;Caring for Others;Carry;Dressing;Sleep;Transfers    Examination-Participation Restrictions Laundry;Cleaning;Shop;Community Activity;Interpersonal Relationship    Stability/Clinical Decision Making Evolving/Moderate complexity    Rehab Potential Good    PT Frequency 2x / week    PT Duration 12 weeks    PT Treatment/Interventions Cryotherapy;Moist Heat;Therapeutic exercise;Patient/family education;Neuromuscular re-education;Manual techniques;ADLs/Self  Care Home Management;Aquatic Therapy;Electrical Stimulation;Functional mobility training;Stair training;Gait training;DME Instruction;Therapeutic activities;Balance training;Passive range of motion;Dry needling;Joint Manipulations;Spinal Manipulations;Energy conservation    PT Next Visit Plan 6MWT as able, core, LE, functional strengthening and mobility    PT Home Exercise Plan Medbridge Access Code: [email or web address]    Consulted and Agree with Plan of Care Patient           Patient will benefit from skilled therapeutic intervention in order to improve the following deficits and impairments:  Impaired perceived functional ability, Decreased strength, Decreased range of motion, Increased muscle spasms, Pain, Abnormal gait, Decreased knowledge of use of DME, Increased fascial restricitons, Improper body mechanics, Cardiopulmonary status limiting activity, Decreased coordination, Decreased mobility, Impaired tone, Decreased endurance, Decreased activity tolerance, Hypomobility, Decreased balance, Difficulty walking, Impaired flexibility, Obesity  Visit Diagnosis: 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(HCC)  Chronic pain of right knee  Chronic bilateral low back pain, unspecified whether sciatica present  Other abnormalities of gait and mobility  Difficulty in walking, not elsewhere classified  Unsteadiness on feet  Muscle weakness (generalized)  Cervicalgia     Problem List Patient Active Problem List   Diagnosis Date Noted  . COPD (chronic obstructive pulmonary disease) ([place]) 03/22/2020  . Hamstring tendonitis 01/29/2020  . MDD (major depressive disorder), recurrent, in full remission ([place]) 08/12/2019  . Seizure-like activity ([place]) 07/08/2019  . MDD (major depressive disorder), recurrent episode, mild ([place]) 12/29/2018  . Insomnia due to mental condition 12/29/2018  . Disorder of vein 03/04/2018  . Lumbar sprain 03/04/2018  . Muscle weakness 03/04/2018  . Neck sprain 03/04/2018  . Neoplasm  of breast 03/04/2018  . Postmenopausal bleeding 02/18/2018  . Impingement syndrome of shoulder region 02/04/2018  . Chest pain with moderate risk for cardiac etiology 05/19/2017  . History of depression 04/15/2017  . GAD (generalized anxiety disorder) 04/15/2017  . Chronic daily headache 04/15/2017  . Panic attack 04/15/2017  . Nocturnal enuresis 04/15/2017  . Mild cognitive impairment 04/15/2017  . Loss of memory 01/14/2017  . Pain medication agreement signed 01/08/2017  . Headache disorder 12/04/2016  . Chronic, continuous use of opioids 07/01/2015  . Vertigo 07/01/2015  . Chronic midline low back pain without sciatica 03/21/2015  . Acute renal failure (ARF) ([place]) 02/19/2015  . Type 2 diabetes mellitus without complication ([place]) 19/37/9024  . Acute pancreatitis 09/04/2014  . Cerebral palsy ([place]) 01/05/2014  . Cerebral palsy with spastic/ataxic diplegia  01/05/2014  . Hip pain, chronic 04/28/2013  . Essential hypertension 01/10/2013  . Irritable colon 01/10/2013  . Noninfectious gastroenteritis and colitis 01/10/2013  . Encounter for long-term (current) use of other medications 12/04/2012  . Chronic GERD 08/12/2012  . Epigastric pain 08/12/2012  . Diarrhea 08/12/2012  . Primary localized osteoarthrosis, lower leg 05/07/2012  . Difficulty walking 02/06/2012    Everlean Alstrom. Graylon Good, PT, DPT 03/23/20, 6:32 PM  [facility] [address] [place], [address] Phone: [phone]   Fax:  [phone]  Name: Traci Davis MRN: [id number] Date of Birth: 07-03-1970

## 2020-03-29 ENCOUNTER — Other Ambulatory Visit: Payer: Self-pay

## 2020-03-29 ENCOUNTER — Ambulatory Visit: Payer: Medicare Other | Admitting: Physical Therapy

## 2020-03-29 ENCOUNTER — Encounter: Payer: Self-pay | Admitting: Physical Therapy

## 2020-03-29 DIAGNOSIS — G8929 Other chronic pain: Secondary | ICD-10-CM

## 2020-03-29 DIAGNOSIS — G801 Spastic diplegic cerebral palsy: Secondary | ICD-10-CM

## 2020-03-29 DIAGNOSIS — M6281 Muscle weakness (generalized): Secondary | ICD-10-CM

## 2020-03-29 DIAGNOSIS — R262 Difficulty in walking, not elsewhere classified: Secondary | ICD-10-CM

## 2020-03-29 DIAGNOSIS — R2689 Other abnormalities of gait and mobility: Secondary | ICD-10-CM

## 2020-03-29 DIAGNOSIS — R2681 Unsteadiness on feet: Secondary | ICD-10-CM

## 2020-03-29 DIAGNOSIS — M542 Cervicalgia: Secondary | ICD-10-CM

## 2020-03-29 DIAGNOSIS — M545 Low back pain, unspecified: Secondary | ICD-10-CM

## 2020-03-29 DIAGNOSIS — M25561 Pain in right knee: Secondary | ICD-10-CM

## 2020-03-29 NOTE — Therapy (Signed)
[facility] [address] [place], [address] Phone: [phone]   Fax:  [phone]  Physical Therapy Treatment  Patient Details  Name: Traci Davis MRN: [id number] Date of Birth: March 30, 1971 Referring Provider (PT): Leretha Dykes, [place]   Encounter Date: 03/29/2020   PT End of Session - 03/29/20 1940    Visit Number 4    Number of Visits 24    Date for PT Re-Evaluation 05/25/20    Authorization Type [employer] MEDICARE reporting period from 03/02/2020    PT Start Time 0092    PT Stop Time 1730    PT Time Calculation (min) 40 min    Equipment Utilized During Treatment Other (comment)   RW, L TAFO   Activity Tolerance Patient limited by fatigue;Patient limited by pain;Patient tolerated treatment well    Behavior During Therapy [facility] for tasks assessed/performed           Past Medical History:  Diagnosis Date   Anxiety    Arthritis    Asthma    Cerebral palsy ([place])    Complication of anesthesia    panic attacks before surgery   COPD (chronic obstructive pulmonary disease) ([place])    Depression    Diabetes mellitus without complication ([place])    GERD (gastroesophageal reflux disease)    Hypertension     Past Surgical History:  Procedure Laterality Date   CESAREAN SECTION  1995   CHOLECYSTECTOMY     DILATATION & CURETTAGE/HYSTEROSCOPY WITH MYOSURE N/A 02/20/2018   Procedure: [place] ENDOMETRIAL POLYPECTOMY;  Surgeon: Will Bonnet, MD;  Location: [facility];  Service: Gynecology;  Laterality: N/A;   ESOPHAGOGASTRODUODENOSCOPY (EGD) WITH PROPOFOL N/A 01/01/2019   Procedure: ESOPHAGOGASTRODUODENOSCOPY (EGD) WITH PROPOFOL;  Surgeon: Lin Landsman, MD;  Location: [facility];  Service: Gastroenterology;  Laterality: N/A;   FOOT CAPSULE RELEASE W/ PERCUTANEOUS HEEL CORD LENGTHENING, TIBIAL TENDON TRANSFER     age 49 ,and 2010-both legs    JOINT REPLACEMENT Right    both knees partial replacements dates unknown   knee replacment     x 5 per pt. last one- left knee 2014. has had 2 on R 3 on L   TYMPANOPLASTY WITH GRAFT Right 01/08/2018   Procedure: TYMPANOPLASTY WITH POSSIBLE OSSICULAR CHAIN RECONSTRUCTION;  Surgeon: [name], MD;  Location: [facility];  Service: ENT;  Laterality: Right;    There were no vitals filed for this visit.   Subjective Assessment - 03/29/20 1652    Subjective Patient reports her pain is better and rates her back pain 6/10 near the right low back. Is here iwth her RW and locking L KAFO. R posterior knee is still tender to touch. Felt okay following last session.    Pertinent History Patient is a 49 y.o. female who presents to outpatient physical therapy with a referral for medical diagnosis of spastic diplegic cerebral palsy, gait abnormality, right knee gives way, lumbar spondylosis, gait intability. This patient's chief complaints consist of distal R lateral hamstring "pull"/pain and difficulty walking as well as chronic pain throughout the body affecting R > L side leading to the following functional deficits: difficulty with basic ADLs, IADLs, community and household mobility, community participation, social participation with her family and friends, sleeping, getting around her home, quality of life Relevant past medical history and comorbidities include spastic diplegic CP, neck pain, back pain, R shoulder pain, R knee pain, anxiety, COPD, depression, diabetes mellitus; surgeries include: bilateral  partial knee replacement, hypertension, foot capsule release with percutaneous heel cord lengthening B as child, GERD, chronic pain, cholecystectomy, hysterectomy. Patient denies hx of cancer, stroke, seizures, major cardiac events, unexplained weight loss.    Limitations Lifting;House hold activities;Other (comment);Walking;Standing   sleeping   Diagnostic tests radiograph report from [facility] on 01/29/2020 that  shows "Status post patellofemoral arthroplasty and medial femoral condyle interferent screw. Hardware is intact without evidence of new or increasing periprosthetic lucency. No acute fracture. No dislocation. No joint effusion. Mild arthrosis of the medial and lateral compartments with marginal osteophytosis. Included views of the contralateral left hip demonstrate degenerative changes as well has a patellofemoral arthroplasty with tibial cerclage wires. "  R knee non-contrast MRI report at [facility] from 05/12/2019 reports "1. Artifact from patient motion degrades image quality and limits sensitivity of this exam. 2. Postsurgical changes of patellofemoral arthroplasty and screw fixation of the medial femoral condyle for ligamentous reconstruction. Associated metal artifact limits regional evaluation. 3. Small effusion with large intra-articular ossified body within the posterolateral femorotibial joint, measuring up to 17 mm. 4. Multifocal low-grade partial-thickness chondral loss throughout the central medial and lateral compartments. 5. Attenuation and laxity of the anterior cruciate ligament may represent a partial tear." Lumbar MRI report 01/10/2020: "IMPRESSION: 1. Minimal for age noncompressive disc bulging at L2-3 and L3-4 without stenosis or neural impingement. 2. Mild to moderate bilateral facet hypertrophy at L2-3 through L4-5, which could contribute to underlying back pain. 3. Lower lumbar epidural lipomatosis. "    Patient Stated Goals get in and out of house and walk around house and yard.    Currently in Pain? Yes    Pain Score 6     Pain Location Back    Pain Onset More than a month ago   October 2018         OBJECTIVE: 6 Minute Walk Test: 100 feet in 4 min with RW and locked L KAFO.     TREATMENT:  Therapeutic exercise:to centralize symptoms and improve ROM, strength, muscular endurance, and activity tolerance required for successful completion of functional activities. - ambulation 2x  100 feet with RW.  - addition of velcro to left KAFO to keep strap in place - seated long arc quad, 3x10 each side with 4# ankle weight - standing hip abduction with B UE support, 1x10 each side - standing hip extension with B UE support, 1x10 each side - seated hamstring curl sliding furniture slider on floor, 3x10 each side.  - seated R hamstring stretch, 5 second holds x 30.  -Education on HEP including handout  HOME EXERCISE PROGRAM Access Code: [email or web address] URL: [email or web address] Date: 03/29/2020 Prepared by: Rosita Kea  Exercises Seated Shoulder Shrug Circles AROM Backward - 1 x daily - 2 sets - 10 reps Seated Long Arc Quad - 1 x daily - 3 sets - 10 reps Supine Posterior Pelvic Tilt - 1 x daily - 20 reps Seated Quad Set - 1 x daily - 3 sets - 30 seconds hold    PT Education - 03/29/20 1940    Education Details Exercise purpose/form. Self management techniques.    Person(s) Educated Patient    Methods Explanation;Demonstration;Tactile cues;Verbal cues;Handout    Comprehension Verbalized understanding;Returned demonstration;Verbal cues required;Tactile cues required;Need further instruction            PT Short Term Goals - 03/23/20 1832      PT SHORT TERM GOAL #1   Title Be independent with initial home exercise program for self-management of symptoms  Baseline too be initiated at visit 2 as appropriate (03/02/2020):    Time 3    Period Weeks    Status Achieved    Target Date 03/24/20             PT Long Term Goals - 03/03/20 1943      PT LONG TERM GOAL #1   Title Be independent with a long-term home exercise program for self-management of symptoms.    Baseline Initial HEP to be establised at visit 2 as appropriate (03/02/2020);    Time 12    Period Weeks    Status New   TARGET DATE FOR ALL LONG TERM GOALS: 05/25/2020     PT LONG TERM GOAL #2   Title Demonstrate improved FOTO score by 10 units to demonstrate improvement in overall  condition and self-reported functional ability.    Baseline to be taken at visit 2 (03/02/2020);    Time 12    Period Weeks    Status New      PT LONG TERM GOAL #3   Title Reduce pain with functional activities to equal or less than 3/10 to allow patient to complete usual activities including ADLs, IADLs, and social engagement with less difficulty.    Baseline 8/10 (03/02/2020);    Time 12    Period Weeks    Status New      PT LONG TERM GOAL #4   Title Ambulate equal or greater than 600 feet in 6 Minute Walk Test to demonstrate improved community and household mobility.    Baseline 2 min walk test 73 feet (6MWT deferred due to needing to stop to adjust height of RW to decrease pain, 03/02/2020);    Time 12    Period Weeks    Status New      PT LONG TERM GOAL #5   Title Complete community, work and/or recreational activities with 50% decrease in limitation due to current condition.    Baseline Difficulty with basic ADLs, IADLs, community and household mobility, community participation, social participation with her family and friends, sleeping, quality of life (03/02/2020);    Time 12    Period Weeks    Status New                 Plan - 03/29/20 1944    Clinical Impression Statement Patient tolerated treatment session today better than at last session and was able to complete a lot more interventions.  Today session focused on improving ambulation ability and lower extremity strengthening and range of motion.  Patient did complain of some discomfort in her hips right more than left during standing hip abduction and extension.  Continue to educate patient on role of physical therapy and exercise/activity to improve her function.  Patient would benefit from continued management of limiting condition by skilled physical therapist to address remaining impairments and functional limitations to work towards stated goals and return to PLOF or maximal functional independence.    Personal  Factors and Comorbidities Age;Behavior Pattern;Comorbidity 3+;Education;Social Background;Past/Current Experience;Fitness;Time since onset of injury/illness/exacerbation    Comorbidities Relevant past medical history and comorbidities include spastic diplegic CP, neck pain, back pain, R shoulder pain, R knee pain, anxiety, COPD, depression, diabetes mellitus; surgeries include: bilateral partial knee replacement, hypertension, foot capsule release with percutaneous heel cord lengthening B as child, GERD, chronic pain, cholecystectomy, hysterectomy.    Examination-Activity Limitations Bathing;Hygiene/Grooming;Squat;Bed Mobility;Lift;Stairs;Bend;Locomotion Level;Stand;Caring for Others;Carry;Dressing;Sleep;Transfers    Examination-Participation Restrictions Laundry;Cleaning;Shop;Community Activity;Interpersonal Relationship    Stability/Clinical Decision Making Evolving/Moderate  complexity    Rehab Potential Good    PT Frequency 2x / week    PT Duration 12 weeks    PT Treatment/Interventions Cryotherapy;Moist Heat;Therapeutic exercise;Patient/family education;Neuromuscular re-education;Manual techniques;ADLs/Self Care Home Management;Aquatic Therapy;Electrical Stimulation;Functional mobility training;Stair training;Gait training;DME Instruction;Therapeutic activities;Balance training;Passive range of motion;Dry needling;Joint Manipulations;Spinal Manipulations;Energy conservation    PT Next Visit Plan core, LE, functional strengthening and mobility    PT Home Exercise Plan Medbridge Access Code: [email or web address]    Consulted and Agree with Plan of Care Patient           Patient will benefit from skilled therapeutic intervention in order to improve the following deficits and impairments:  Impaired perceived functional ability, Decreased strength, Decreased range of motion, Increased muscle spasms, Pain, Abnormal gait, Decreased knowledge of use of DME, Increased fascial restricitons, Improper body mechanics,  Cardiopulmonary status limiting activity, Decreased coordination, Decreased mobility, Impaired tone, Decreased endurance, Decreased activity tolerance, Hypomobility, Decreased balance, Difficulty walking, Impaired flexibility, Obesity  Visit Diagnosis: 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(HCC)  Chronic pain of right knee  Chronic bilateral low back pain, unspecified whether sciatica present  Other abnormalities of gait and mobility  Difficulty in walking, not elsewhere classified  Unsteadiness on feet  Muscle weakness (generalized)  Cervicalgia     Problem List Patient Active Problem List   Diagnosis Date Noted   COPD (chronic obstructive pulmonary disease) ([place]) 03/22/2020   Hamstring tendonitis 01/29/2020   MDD (major depressive disorder), recurrent, in full remission ([place]) 08/12/2019   Seizure-like activity ([place]) 07/08/2019   MDD (major depressive disorder), recurrent episode, mild ([place]) 12/29/2018   Insomnia due to mental condition 12/29/2018   Disorder of vein 03/04/2018   Lumbar sprain 03/04/2018   Muscle weakness 03/04/2018   Neck sprain 03/04/2018   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   Postmenopausal bleeding 02/18/2018   Impingement syndrome of shoulder region 02/04/2018   Chest pain with moderate risk for cardiac etiology 05/19/2017   History of depression 04/15/2017   GAD (generalized anxiety disorder) 04/15/2017   Chronic daily headache 04/15/2017   Panic attack 04/15/2017   Nocturnal enuresis 04/15/2017   Mild cognitive impairment 04/15/2017   Loss of memory 01/14/2017   Pain medication agreement signed 01/08/2017   Headache disorder 12/04/2016   Chronic, continuous use of opioids 07/01/2015   Vertigo 07/01/2015   Chronic midline low back pain without sciatica 03/21/2015   Acute renal failure (ARF) ([place]) 02/19/2015   Type 2 diabetes mellitus without complication ([place]) 82/88/3374   Acute pancreatitis 09/04/2014   Cerebral palsy  ([place]) 01/05/2014   Cerebral palsy with spastic/ataxic diplegia 01/05/2014   Hip pain, chronic 04/28/2013   Essential hypertension 01/10/2013   Irritable colon 01/10/2013   Noninfectious gastroenteritis and colitis 01/10/2013   Encounter for long-term (current) use of other medications 12/04/2012   Chronic GERD 08/12/2012   Epigastric pain 08/12/2012   Diarrhea 08/12/2012   Primary localized osteoarthrosis, lower leg 05/07/2012   Difficulty walking 02/06/2012    Everlean Alstrom. Graylon Good, PT, DPT 03/29/20, 7:44 PM  [facility] [address], [place], [address] Phone: [phone]   Fax:  [phone]  Name: TASHENA IBACH MRN: [id number] Date of Birth: 10-Oct-1970

## 2020-03-31 ENCOUNTER — Ambulatory Visit: Payer: Medicare Other | Admitting: Physical Therapy

## 2020-04-04 ENCOUNTER — Ambulatory Visit: Payer: Medicare Other | Admitting: Physical Therapy

## 2020-04-06 ENCOUNTER — Encounter: Payer: Medicare Other | Admitting: Physical Therapy

## 2020-04-11 ENCOUNTER — Encounter: Payer: Self-pay | Admitting: Physical Therapy

## 2020-04-11 ENCOUNTER — Ambulatory Visit: Payer: Medicare Other | Attending: Physical Medicine and Rehabilitation | Admitting: Physical Therapy

## 2020-04-11 ENCOUNTER — Telehealth: Payer: Self-pay | Admitting: Physical Therapy

## 2020-04-11 ENCOUNTER — Other Ambulatory Visit: Payer: Self-pay

## 2020-04-11 DIAGNOSIS — R2681 Unsteadiness on feet: Secondary | ICD-10-CM | POA: Diagnosis present

## 2020-04-11 DIAGNOSIS — R2689 Other abnormalities of gait and mobility: Secondary | ICD-10-CM | POA: Diagnosis present

## 2020-04-11 DIAGNOSIS — M545 Low back pain, unspecified: Secondary | ICD-10-CM | POA: Diagnosis present

## 2020-04-11 DIAGNOSIS — R262 Difficulty in walking, not elsewhere classified: Secondary | ICD-10-CM | POA: Diagnosis present

## 2020-04-11 DIAGNOSIS — M542 Cervicalgia: Secondary | ICD-10-CM | POA: Diagnosis present

## 2020-04-11 DIAGNOSIS — M6281 Muscle weakness (generalized): Secondary | ICD-10-CM | POA: Diagnosis present

## 2020-04-11 DIAGNOSIS — M25561 Pain in right knee: Secondary | ICD-10-CM | POA: Insufficient documentation

## 2020-04-11 DIAGNOSIS — G8929 Other chronic pain: Secondary | ICD-10-CM | POA: Diagnosis present

## 2020-04-11 DIAGNOSIS — G801 Spastic diplegic cerebral palsy: Secondary | ICD-10-CM | POA: Insufficient documentation

## 2020-04-11 NOTE — Therapy (Signed)
[facility] [address], [place], [address] Phone: [phone]   Fax:  [phone]  Physical Therapy Treatment  Patient Details  Name: Traci Davis MRN: [id number] Date of Birth: 1970/10/17 Referring Provider (PT): Leretha Dykes, [place]   Encounter Date: 04/11/2020   PT End of Session - 04/11/20 1607    Visit Number 5    Number of Visits 24    Date for PT Re-Evaluation 05/25/20    Authorization Type [employer] MEDICARE reporting period from 03/02/2020    PT Start Time 1603    PT Stop Time 1643    PT Time Calculation (min) 40 min    Equipment Utilized During Treatment Other (comment)   RW, L KAFO   Activity Tolerance Patient limited by fatigue;Patient limited by pain;Patient tolerated treatment well    Behavior During Therapy [facility] for tasks assessed/performed           Past Medical History:  Diagnosis Date   Anxiety    Arthritis    Asthma    Cerebral palsy ([place])    Complication of anesthesia    panic attacks before surgery   COPD (chronic obstructive pulmonary disease) ([place])    Depression    Diabetes mellitus without complication ([place])    GERD (gastroesophageal reflux disease)    Hypertension     Past Surgical History:  Procedure Laterality Date   CESAREAN SECTION  1995   CHOLECYSTECTOMY     DILATATION & CURETTAGE/HYSTEROSCOPY WITH MYOSURE N/A 02/20/2018   Procedure: [place] ENDOMETRIAL POLYPECTOMY;  Surgeon: Will Bonnet, MD;  Location: [facility];  Service: Gynecology;  Laterality: N/A;   ESOPHAGOGASTRODUODENOSCOPY (EGD) WITH PROPOFOL N/A 01/01/2019   Procedure: ESOPHAGOGASTRODUODENOSCOPY (EGD) WITH PROPOFOL;  Surgeon: Lin Landsman, MD;  Location: [place];  Service: Gastroenterology;  Laterality: N/A;   FOOT CAPSULE RELEASE W/ PERCUTANEOUS HEEL CORD LENGTHENING, TIBIAL TENDON TRANSFER     age 49 ,and 2010-both legs    JOINT REPLACEMENT Right    both knees partial replacements dates unknown   knee replacment     x 5 per pt. last one- left knee 2014. has had 2 on R 3 on L   TYMPANOPLASTY WITH GRAFT Right 01/08/2018   Procedure: TYMPANOPLASTY WITH POSSIBLE OSSICULAR CHAIN RECONSTRUCTION;  Surgeon: [name], MD;  Location: [facility];  Service: ENT;  Laterality: Right;    There were no vitals filed for this visit.   Subjective Assessment - 04/11/20 1605    Subjective Patient reports she is feeling pretty good today and rates her pain 7/10 R > L in the low back. She was a bit sore following last treatment session in her legs. She missed a couple of appointments because her granddaughter got injured by her dog's tie out and then she had too much pain while it was raining.    Pertinent History Patient is a 49 y.o. female who presents to outpatient physical therapy with a referral for medical diagnosis of spastic diplegic cerebral palsy, gait abnormality, right knee gives way, lumbar spondylosis, gait intability. This patient's chief complaints consist of distal R lateral hamstring "pull"/pain and difficulty walking as well as chronic pain throughout the body affecting R > L side leading to the following functional deficits: difficulty with basic ADLs, IADLs, community and household mobility, community participation, social participation with her family and friends, sleeping, getting around her home, quality of life Relevant past medical history and comorbidities include spastic  diplegic CP, neck pain, back pain, R shoulder pain, R knee pain, anxiety, COPD, depression, diabetes mellitus; surgeries include: bilateral partial knee replacement, hypertension, foot capsule release with percutaneous heel cord lengthening B as child, GERD, chronic pain, cholecystectomy, hysterectomy. Patient denies hx of cancer, stroke, seizures, major cardiac events, unexplained weight loss.    Limitations Lifting;House hold activities;Other  (comment);Walking;Standing   sleeping   Diagnostic tests radiograph report from [facility] on 01/29/2020 that shows "Status post patellofemoral arthroplasty and medial femoral condyle interferent screw. Hardware is intact without evidence of new or increasing periprosthetic lucency. No acute fracture. No dislocation. No joint effusion. Mild arthrosis of the medial and lateral compartments with marginal osteophytosis. Included views of the contralateral left hip demonstrate degenerative changes as well has a patellofemoral arthroplasty with tibial cerclage wires. "  R knee non-contrast MRI report at [facility] from 05/12/2019 reports "1. Artifact from patient motion degrades image quality and limits sensitivity of this exam. 2. Postsurgical changes of patellofemoral arthroplasty and screw fixation of the medial femoral condyle for ligamentous reconstruction. Associated metal artifact limits regional evaluation. 3. Small effusion with large intra-articular ossified body within the posterolateral femorotibial joint, measuring up to 17 mm. 4. Multifocal low-grade partial-thickness chondral loss throughout the central medial and lateral compartments. 5. Attenuation and laxity of the anterior cruciate ligament may represent a partial tear." Lumbar MRI report 01/10/2020: "IMPRESSION: 1. Minimal for age noncompressive disc bulging at L2-3 and L3-4 without stenosis or neural impingement. 2. Mild to moderate bilateral facet hypertrophy at L2-3 through L4-5, which could contribute to underlying back pain. 3. Lower lumbar epidural lipomatosis. "    Patient Stated Goals get in and out of house and walk around house and yard.    Currently in Pain? Yes    Pain Score 7     Pain Location Back    Pain Onset More than a month ago   October 2018          TREATMENT:  Therapeutic exercise:to centralize symptoms and improve ROM, strength, muscular endurance, and activity tolerance required for successful completion of functional  activities. - ambulation 3x 100 feet with RW (adjusted RW one notch lower after first 100 feet for improved comfort of R shoulder).  - seated long arc quad, 3x10 each side with 4# ankle weight - standing hip abduction with B UE support, 1x10 each side, contralateral leg slightly elevated on 1 inch lift to allow moving leg to move more freely.  - seated hamstring curl sliding furniture slider on floor, 3x10 each side.  -Education on HEP including handout  HOME EXERCISE PROGRAM Access Code: [email or web address] URL: [email or web address] Date: 03/29/2020 Prepared by: Rosita Kea  Exercises Seated Shoulder Shrug Circles AROM Backward - 1 x daily - 2 sets - 10 reps Seated Long Arc Quad - 1 x daily - 3 sets - 10 reps Supine Posterior Pelvic Tilt - 1 x daily - 20 reps Seated Quad Set - 1 x daily - 3 sets - 30 seconds hold     PT Education - 04/11/20 1607    Education Details Exercise purpose/form. Self management techniques.    Person(s) Educated Patient    Methods Explanation;Demonstration;Tactile cues;Verbal cues    Comprehension Verbalized understanding;Returned demonstration;Tactile cues required;Verbal cues required;Need further instruction            PT Short Term Goals - 03/23/20 1832      PT SHORT TERM GOAL #1   Title Be independent with initial home exercise program for self-management of symptoms  Baseline too be initiated at visit 2 as appropriate (03/02/2020):    Time 3    Period Weeks    Status Achieved    Target Date 03/24/20             PT Long Term Goals - 03/03/20 1943      PT LONG TERM GOAL #1   Title Be independent with a long-term home exercise program for self-management of symptoms.    Baseline Initial HEP to be establised at visit 2 as appropriate (03/02/2020);    Time 12    Period Weeks    Status New   TARGET DATE FOR ALL LONG TERM GOALS: 05/25/2020     PT LONG TERM GOAL #2   Title Demonstrate improved FOTO score by 10 units to  demonstrate improvement in overall condition and self-reported functional ability.    Baseline to be taken at visit 2 (03/02/2020);    Time 12    Period Weeks    Status New      PT LONG TERM GOAL #3   Title Reduce pain with functional activities to equal or less than 3/10 to allow patient to complete usual activities including ADLs, IADLs, and social engagement with less difficulty.    Baseline 8/10 (03/02/2020);    Time 12    Period Weeks    Status New      PT LONG TERM GOAL #4   Title Ambulate equal or greater than 600 feet in 6 Minute Walk Test to demonstrate improved community and household mobility.    Baseline 2 min walk test 73 feet (6MWT deferred due to needing to stop to adjust height of RW to decrease pain, 03/02/2020);    Time 12    Period Weeks    Status New      PT LONG TERM GOAL #5   Title Complete community, work and/or recreational activities with 50% decrease in limitation due to current condition.    Baseline Difficulty with basic ADLs, IADLs, community and household mobility, community participation, social participation with her family and friends, sleeping, quality of life (03/02/2020);    Time 12    Period Weeks    Status New                 Plan - 04/11/20 1629    Clinical Impression Statement Patient tolerated treatment with some limitations due to pain and fatigue. Had difficulty with ambulation in clinic using RW due to R shoulder and UE pain and standing exercises due to R hip pain. Patient stated she felt comfortable ambulating to her car independently with her RW at the end of the treatment session. However, when PT came to check on her progress to her car patient was falling off the edge of the curb of the walkway into the parking lot, sideways with another person there. PT immediately went to patient to assist to safe  position on the ground until her condition for attempting to get up was assessed and more help arrived to boost her max A +2 into a  chair. The person with her when she fell reported no head contact during fall. Patient complained of soreness on her buttocks where she fell and her neck and right shoulder. Was able to ambulate 20 feet with RW with same gait pattern as previously, had no obvious restriction beyond baseline to cervical spine ROM, and able to move R UE as previously. No skin injuries visible and all joints appeared to be moving as previously  up on examination. Cognition appeared to be baseline. Patient into her care on her own power and declined offer for further examination in the clinic or for further medical care. Follow up by phone revealed she was home safe and feeling her usual low back and R arm pain, with right arm pain more elevated than usual. Patient continues to be limited in function and mobility by spastic diplegia and deconditioning. Patient would benefit from continued management of limiting condition by skilled physical therapist to address remaining impairments and functional limitations to work towards stated goals and return to PLOF or maximal functional independence.    Personal Factors and Comorbidities Age;Behavior Pattern;Comorbidity 3+;Education;Social Background;Past/Current Experience;Fitness;Time since onset of injury/illness/exacerbation    Comorbidities Relevant past medical history and comorbidities include spastic diplegic CP, neck pain, back pain, R shoulder pain, R knee pain, anxiety, COPD, depression, diabetes mellitus; surgeries include: bilateral partial knee replacement, hypertension, foot capsule release with percutaneous heel cord lengthening B as child, GERD, chronic pain, cholecystectomy, hysterectomy.    Examination-Activity Limitations Bathing;Hygiene/Grooming;Squat;Bed Mobility;Lift;Stairs;Bend;Locomotion Level;Stand;Caring for Others;Carry;Dressing;Sleep;Transfers    Examination-Participation Restrictions Laundry;Cleaning;Shop;Community Activity;Interpersonal Relationship     Stability/Clinical Decision Making Evolving/Moderate complexity    Rehab Potential Good    PT Frequency 2x / week    PT Duration 12 weeks    PT Treatment/Interventions Cryotherapy;Moist Heat;Therapeutic exercise;Patient/family education;Neuromuscular re-education;Manual techniques;ADLs/Self Care Home Management;Aquatic Therapy;Electrical Stimulation;Functional mobility training;Stair training;Gait training;DME Instruction;Therapeutic activities;Balance training;Passive range of motion;Dry needling;Joint Manipulations;Spinal Manipulations;Energy conservation    PT Next Visit Plan core, LE, functional strengthening and mobility    PT Home Exercise Plan Medbridge Access Code: [email or web address]    Consulted and Agree with Plan of Care Patient           Patient will benefit from skilled therapeutic intervention in order to improve the following deficits and impairments:  Impaired perceived functional ability,Decreased strength,Decreased range of motion,Increased muscle spasms,Pain,Abnormal gait,Decreased knowledge of use of DME,Increased fascial restricitons,Improper body mechanics,Cardiopulmonary status limiting activity,Decreased coordination,Decreased mobility,Impaired tone,Decreased endurance,Decreased activity tolerance,Hypomobility,Decreased balance,Difficulty walking,Impaired flexibility,Obesity  Visit Diagnosis: 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(HCC)  Chronic pain of right knee  Chronic bilateral low back pain, unspecified whether sciatica present  Other abnormalities of gait and mobility  Difficulty in walking, not elsewhere classified  Unsteadiness on feet  Muscle weakness (generalized)  Cervicalgia     Problem List Patient Active Problem List   Diagnosis Date Noted   COPD (chronic obstructive pulmonary disease) ([place]) 03/22/2020   Hamstring tendonitis 01/29/2020   MDD (major depressive disorder), recurrent, in full remission ([place]) 08/12/2019   Seizure-like activity ([place])  07/08/2019   MDD (major depressive disorder), recurrent episode, mild ([place]) 12/29/2018   Insomnia due to mental condition 12/29/2018   Disorder of vein 03/04/2018   Lumbar sprain 03/04/2018   Muscle weakness 03/04/2018   Neck sprain 03/04/2018   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   Postmenopausal bleeding 02/18/2018   Impingement syndrome of shoulder region 02/04/2018   Chest pain with moderate risk for cardiac etiology 05/19/2017   History of depression 04/15/2017   GAD (generalized anxiety disorder) 04/15/2017   Chronic daily headache 04/15/2017   Panic attack 04/15/2017   Nocturnal enuresis 04/15/2017   Mild cognitive impairment 04/15/2017   Loss of memory 01/14/2017   Pain medication agreement signed 01/08/2017   Headache disorder 12/04/2016   Chronic, continuous use of opioids 07/01/2015   Vertigo 07/01/2015   Chronic midline low back pain without sciatica 03/21/2015   Acute renal failure (ARF) ([place]) 02/19/2015   Type 2 diabetes mellitus without  complication ([place]) 22/84/0698   Acute pancreatitis 09/04/2014   Cerebral palsy ([place]) 01/05/2014   Cerebral palsy with spastic/ataxic diplegia 01/05/2014   Hip pain, chronic 04/28/2013   Essential hypertension 01/10/2013   Irritable colon 01/10/2013   Noninfectious gastroenteritis and colitis 01/10/2013   Encounter for long-term (current) use of other medications 12/04/2012   Chronic GERD 08/12/2012   Epigastric pain 08/12/2012   Diarrhea 08/12/2012   Primary localized osteoarthrosis, lower leg 05/07/2012   Difficulty walking 02/06/2012   Everlean Alstrom. Graylon Good, PT, DPT 04/11/20, 7:58 PM  [facility] [address], [place], [address] Phone: [phone]   Fax:  [phone]  Name: DOREATHA OFFER MRN: [id number] Date of Birth: 1970-09-18

## 2020-04-11 NOTE — Telephone Encounter (Signed)
Called patient to check on her after she had fallen in the clinic parking lot before going home today. Patient answered and said she is having her usual pain in her low back and R arm, but R arm is a bit more than usual. Discussed that she would likely be more sore tomorrow and she agreed and said she has not fallen for a long time, but that she is expecting that. Reported no new problems since leaving the clinic.   Everlean Alstrom. Graylon Good, PT, DPT 04/11/20, 7:09 PM

## 2020-04-12 ENCOUNTER — Telehealth: Payer: Self-pay | Admitting: Physical Therapy

## 2020-04-12 NOTE — Telephone Encounter (Signed)
Called patient to check on her after she had fallen outside of the clinic yesterday. patient answered and said she is doing okay and feels up to coming to her next PT appointment tomorrow at 4pm. States her "butt bone" and right shoulder/arm are a bit sore from the fall but overall she is doing okay. Reports butt pain in the middle where she fell and broke it a long time ago.   Everlean Alstrom. Graylon Good, PT, DPT 04/12/20, 12:22 PM

## 2020-04-13 ENCOUNTER — Ambulatory Visit: Payer: Medicare Other | Admitting: Physical Therapy

## 2020-04-13 ENCOUNTER — Telehealth: Payer: Self-pay | Admitting: Physical Therapy

## 2020-04-13 NOTE — Telephone Encounter (Signed)
Called patient when she did not show up for her 4pm appointment today. She said she had just woken up and had forgot all about it. Offered 4:45pm today (latest available) and 9:45 am tomorrow morning. She said it takes her at least 2 hours to get ready and that she would be unable to come at 4:45 and she cannot do mornings. Confirmed her next appointment at 4pm Monday 04/18/20. States she is a bit sore but doing okay.   Traci Davis. Graylon Good, PT, DPT 04/13/20, 4:22 PM

## 2020-04-18 ENCOUNTER — Other Ambulatory Visit: Payer: Self-pay

## 2020-04-18 ENCOUNTER — Ambulatory Visit: Payer: Medicare Other | Admitting: Physical Therapy

## 2020-04-18 ENCOUNTER — Encounter: Payer: Self-pay | Admitting: Physical Therapy

## 2020-04-18 DIAGNOSIS — M542 Cervicalgia: Secondary | ICD-10-CM

## 2020-04-18 DIAGNOSIS — G8929 Other chronic pain: Secondary | ICD-10-CM

## 2020-04-18 DIAGNOSIS — M25561 Pain in right knee: Secondary | ICD-10-CM

## 2020-04-18 DIAGNOSIS — R2681 Unsteadiness on feet: Secondary | ICD-10-CM

## 2020-04-18 DIAGNOSIS — M6281 Muscle weakness (generalized): Secondary | ICD-10-CM

## 2020-04-18 DIAGNOSIS — R262 Difficulty in walking, not elsewhere classified: Secondary | ICD-10-CM

## 2020-04-18 DIAGNOSIS — G801 Spastic diplegic cerebral palsy: Secondary | ICD-10-CM

## 2020-04-18 DIAGNOSIS — R2689 Other abnormalities of gait and mobility: Secondary | ICD-10-CM

## 2020-04-18 NOTE — Therapy (Signed)
[facility] [address] [place], [address] Phone: [phone]   Fax:  [phone]  Physical Therapy Treatment  Patient Details  Name: Traci Davis MRN: [id number] Date of Birth: 1970-12-01 Referring Provider (PT): Jason Fila [place], [place]   Encounter Date: 04/18/2020   PT End of Session - 04/18/20 1934    Visit Number 6    Number of Visits 24    Date for PT Re-Evaluation 05/25/20    Authorization Type [employer] MEDICARE reporting period from 03/02/2020    PT Start Time 1600    PT Stop Time 1645    PT Time Calculation (min) 45 min    Equipment Utilized During Treatment Other (comment)   RW, L KAFO   Activity Tolerance Patient limited by fatigue;Patient limited by pain    Behavior During Therapy [facility] for tasks assessed/performed           Past Medical History:  Diagnosis Date   Anxiety    Arthritis    Asthma    Cerebral palsy ([place])    Complication of anesthesia    panic attacks before surgery   COPD (chronic obstructive pulmonary disease) ([place])    Depression    Diabetes mellitus without complication ([place])    GERD (gastroesophageal reflux disease)    Hypertension     Past Surgical History:  Procedure Laterality Date   [place] N/A 02/20/2018   Procedure: DILATATION & CURETTAGE/HYSTEROSCOPY WITH MYOSURE ENDOMETRIAL POLYPECTOMY;  Surgeon: Will Bonnet, MD;  Location: [facility];  Service: Gynecology;  Laterality: N/A;   ESOPHAGOGASTRODUODENOSCOPY (EGD) WITH PROPOFOL N/A 01/01/2019   Procedure: ESOPHAGOGASTRODUODENOSCOPY (EGD) WITH PROPOFOL;  Surgeon: Lin Landsman, MD;  Location: [place];  Service: Gastroenterology;  Laterality: N/A;   FOOT CAPSULE RELEASE W/ PERCUTANEOUS HEEL CORD LENGTHENING, TIBIAL TENDON TRANSFER     age 3 ,and 2010-both legs   JOINT REPLACEMENT Right    both  knees partial replacements dates unknown   knee replacment     x 5 per pt. last one- left knee 2014. has had 2 on R 3 on L   TYMPANOPLASTY WITH GRAFT Right 01/08/2018   Procedure: TYMPANOPLASTY WITH POSSIBLE OSSICULAR CHAIN RECONSTRUCTION;  Surgeon: [name], MD;  Location: [facility];  Service: ENT;  Laterality: Right;    There were no vitals filed for this visit.   Subjective Assessment - 04/18/20 1603    Subjective Pateint report her R shoulder is hurting so much she can't touch it and hurts all the way down her hand. She currently rates at 5/10. She states she thinks this is from using her walker so much. She states she is not using her W/C because her boyfriend is using it. She states when this happens in the past she has to stop using the walker for 2 weeks and use the W/C and it gets better. She has been to [facility] who reccomended this with a sling in the past. She has pain in her neck and right arm but she did not get the injection in her neck from the pain clinic she usually has gotten and helped in the past. She state her shoulder is bothering her more since she fell last week. She also has a bit of pain in her right "butt muscle" where it feels like a bruise after the fall last week.    Pertinent History Patient is  a 49 y.o. female who presents to outpatient physical therapy with a referral for medical diagnosis of spastic diplegic cerebral palsy, gait abnormality, right knee gives way, lumbar spondylosis, gait intability. This patient's chief complaints consist of distal R lateral hamstring "pull"/pain and difficulty walking as well as chronic pain throughout the body affecting R > L side leading to the following functional deficits: difficulty with basic ADLs, IADLs, community and household mobility, community participation, social participation with her family and friends, sleeping, getting around her home, quality of life Relevant past medical history and comorbidities include  spastic diplegic CP, neck pain, back pain, R shoulder pain, R knee pain, anxiety, COPD, depression, diabetes mellitus; surgeries include: bilateral partial knee replacement, hypertension, foot capsule release with percutaneous heel cord lengthening B as child, GERD, chronic pain, cholecystectomy, hysterectomy. Patient denies hx of cancer, stroke, seizures, major cardiac events, unexplained weight loss.    Limitations Lifting;House hold activities;Other (comment);Walking;Standing   sleeping   Diagnostic tests radiograph report from [place] on 01/29/2020 that shows "Status post patellofemoral arthroplasty and medial femoral condyle interferent screw. Hardware is intact without evidence of new or increasing periprosthetic lucency. No acute fracture. No dislocation. No joint effusion. Mild arthrosis of the medial and lateral compartments with marginal osteophytosis. Included views of the contralateral left hip demonstrate degenerative changes as well has a patellofemoral arthroplasty with tibial cerclage wires. "  R knee non-contrast MRI report at [facility] from 05/12/2019 reports "1. Artifact from patient motion degrades image quality and limits sensitivity of this exam. 2. Postsurgical changes of patellofemoral arthroplasty and screw fixation of the medial femoral condyle for ligamentous reconstruction. Associated metal artifact limits regional evaluation. 3. Small effusion with large intra-articular ossified body within the posterolateral femorotibial joint, measuring up to 17 mm. 4. Multifocal low-grade partial-thickness chondral loss throughout the central medial and lateral compartments. 5. Attenuation and laxity of the anterior cruciate ligament may represent a partial tear." Lumbar MRI report 01/10/2020: "IMPRESSION: 1. Minimal for age noncompressive disc bulging at L2-3 and L3-4 without stenosis or neural impingement. 2. Mild to moderate bilateral facet hypertrophy at L2-3 through L4-5, which could contribute to  underlying back pain. 3. Lower lumbar epidural lipomatosis. "    Patient Stated Goals get in and out of house and walk around house and yard.    Currently in Pain? Yes    Pain Score 5     Pain Onset More than a month ago   October 2018           TREATMENT:  Manual therapy: to reduce pain and tissue tension, improve range of motion, neuromodulation, in order to promote improved ability to complete functional activities. - supine STM to posterior neck musculature and B UT, R > L to desensitize pain.   Therapeutic exercise:to centralize symptoms and improve ROM, strength, muscular endurance, and activity tolerance required for successful completion of functional activities. - seated long arc quad, 3x10 each side with 4# ankle weight - seated hamstring curl sliding furniture slider on floor, 3x10 each side., red theraband on R side.  - standing hip abduction and extension with B UE support, 1x10 each side, contralateral leg slightly elevated on 1 inch lift to allow moving leg to move more freely.  - ambulation ~ 100 feet to the vehicle with SBA for safety. Stand to sit car transfer with min A for help unlocking brace.  HOME EXERCISE PROGRAM Access Code: [email or web address] URL: [email or web address] Date: 03/29/2020 Prepared by: Rosita Kea  Exercises Seated Shoulder Shrug Circles AROM  Backward - 1 x daily - 2 sets - 10 reps Seated Long Arc Quad - 1 x daily - 3 sets - 10 reps Supine Posterior Pelvic Tilt - 1 x daily - 20 reps Seated Quad Set - 1 x daily - 3 sets - 30 seconds hold     PT Education - 04/18/20 1933    Education Details Exercise purpose/form. Self management techniques.    Person(s) Educated Patient    Methods Explanation;Demonstration;Tactile cues;Verbal cues    Comprehension Verbalized understanding;Returned demonstration;Verbal cues required;Tactile cues required;Need further instruction            PT Short Term Goals - 03/23/20 1832      PT SHORT  TERM GOAL #1   Title Be independent with initial home exercise program for self-management of symptoms    Baseline too be initiated at visit 2 as appropriate (03/02/2020):    Time 3    Period Weeks    Status Achieved    Target Date 03/24/20             PT Long Term Goals - 03/03/20 1943      PT LONG TERM GOAL #1   Title Be independent with a long-term home exercise program for self-management of symptoms.    Baseline Initial HEP to be establised at visit 2 as appropriate (03/02/2020);    Time 12    Period Weeks    Status New   TARGET DATE FOR ALL LONG TERM GOALS: 05/25/2020     PT LONG TERM GOAL #2   Title Demonstrate improved FOTO score by 10 units to demonstrate improvement in overall condition and self-reported functional ability.    Baseline to be taken at visit 2 (03/02/2020);    Time 12    Period Weeks    Status New      PT LONG TERM GOAL #3   Title Reduce pain with functional activities to equal or less than 3/10 to allow patient to complete usual activities including ADLs, IADLs, and social engagement with less difficulty.    Baseline 8/10 (03/02/2020);    Time 12    Period Weeks    Status New      PT LONG TERM GOAL #4   Title Ambulate equal or greater than 600 feet in 6 Minute Walk Test to demonstrate improved community and household mobility.    Baseline 2 min walk test 73 feet (6MWT deferred due to needing to stop to adjust height of RW to decrease pain, 03/02/2020);    Time 12    Period Weeks    Status New      PT LONG TERM GOAL #5   Title Complete community, work and/or recreational activities with 50% decrease in limitation due to current condition.    Baseline Difficulty with basic ADLs, IADLs, community and household mobility, community participation, social participation with her family and friends, sleeping, quality of life (03/02/2020);    Time 12    Period Weeks    Status New                 Plan - 04/18/20 1936    Clinical Impression Statement  Patient tolerated treatment with some difficulty due to complaints of R sided neck and arm pain. She fatigued quickly and did use her R UE to lean on during seated exercises and reported it was really bothering her as she ambulated out to the car at the end of the session. Reported the manual therapy helped slightly but  she continues to be limited by it. Patient moved slowly today and had difficulty moving through full range of motion with LE exercises. Patient continues to be very functionally limited. Patient would benefit from continued management of limiting condition by skilled physical therapist to address remaining impairments and functional limitations to work towards stated goals and return to PLOF or maximal functional independence.    Personal Factors and Comorbidities Age;Behavior Pattern;Comorbidity 3+;Education;Social Background;Past/Current Experience;Fitness;Time since onset of injury/illness/exacerbation    Comorbidities Relevant past medical history and comorbidities include spastic diplegic CP, neck pain, back pain, R shoulder pain, R knee pain, anxiety, COPD, depression, diabetes mellitus; surgeries include: bilateral partial knee replacement, hypertension, foot capsule release with percutaneous heel cord lengthening B as child, GERD, chronic pain, cholecystectomy, hysterectomy.    Examination-Activity Limitations Bathing;Hygiene/Grooming;Squat;Bed Mobility;Lift;Stairs;Bend;Locomotion Level;Stand;Caring for Others;Carry;Dressing;Sleep;Transfers    Examination-Participation Restrictions Laundry;Cleaning;Shop;Community Activity;Interpersonal Relationship    Stability/Clinical Decision Making Evolving/Moderate complexity    Rehab Potential Good    PT Frequency 2x / week    PT Duration 12 weeks    PT Treatment/Interventions Cryotherapy;Moist Heat;Therapeutic exercise;Patient/family education;Neuromuscular re-education;Manual techniques;ADLs/Self Care Home Management;Aquatic  Therapy;Electrical Stimulation;Functional mobility training;Stair training;Gait training;DME Instruction;Therapeutic activities;Balance training;Passive range of motion;Dry needling;Joint Manipulations;Spinal Manipulations;Energy conservation    PT Next Visit Plan core, LE, functional strengthening and mobility    PT Home Exercise Plan Medbridge Access Code: [email or web address]    Consulted and Agree with Plan of Care Patient           Patient will benefit from skilled therapeutic intervention in order to improve the following deficits and impairments:  Impaired perceived functional ability,Decreased strength,Decreased range of motion,Increased muscle spasms,Pain,Abnormal gait,Decreased knowledge of use of DME,Increased fascial restricitons,Improper body mechanics,Cardiopulmonary status limiting activity,Decreased coordination,Decreased mobility,Impaired tone,Decreased endurance,Decreased activity tolerance,Hypomobility,Decreased balance,Difficulty walking,Impaired flexibility,Obesity  Visit Diagnosis: 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(HCC)  Chronic pain of right knee  Chronic bilateral low back pain, unspecified whether sciatica present  Other abnormalities of gait and mobility  Difficulty in walking, not elsewhere classified  Unsteadiness on feet  Muscle weakness (generalized)  Cervicalgia     Problem List Patient Active Problem List   Diagnosis Date Noted   COPD (chronic obstructive pulmonary disease) ([place]) 03/22/2020   Hamstring tendonitis 01/29/2020   MDD (major depressive disorder), recurrent, in full remission ([place]) 08/12/2019   Seizure-like activity ([place]) 07/08/2019   MDD (major depressive disorder), recurrent episode, mild ([place]) 12/29/2018   Insomnia due to mental condition 12/29/2018   Disorder of vein 03/04/2018   Lumbar sprain 03/04/2018   Muscle weakness 03/04/2018   Neck sprain 03/04/2018   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   Postmenopausal bleeding 02/18/2018    Impingement syndrome of shoulder region 02/04/2018   Chest pain with moderate risk for cardiac etiology 05/19/2017   History of depression 04/15/2017   GAD (generalized anxiety disorder) 04/15/2017   Chronic daily headache 04/15/2017   Panic attack 04/15/2017   Nocturnal enuresis 04/15/2017   Mild cognitive impairment 04/15/2017   Loss of memory 01/14/2017   Pain medication agreement signed 01/08/2017   Headache disorder 12/04/2016   Chronic, continuous use of opioids 07/01/2015   Vertigo 07/01/2015   Chronic midline low back pain without sciatica 03/21/2015   Acute renal failure (ARF) ([place]) 02/19/2015   Type 2 diabetes mellitus without complication ([place]) 81/04/7508   Acute pancreatitis 09/04/2014   Cerebral palsy ([place]) 01/05/2014   Cerebral palsy with spastic/ataxic diplegia 01/05/2014   Hip pain, chronic 04/28/2013   Essential hypertension 01/10/2013   Irritable colon 01/10/2013   Noninfectious  gastroenteritis and colitis 01/10/2013   Encounter for long-term (current) use of other medications 12/04/2012   Chronic GERD 08/12/2012   Epigastric pain 08/12/2012   Diarrhea 08/12/2012   Primary localized osteoarthrosis, lower leg 05/07/2012   Difficulty walking 02/06/2012   Everlean Alstrom. Graylon Good, PT, DPT 04/18/20, 7:37 PM  [facility] [address] [place], [address] Phone: [phone]   Fax:  [phone]  Name: Traci Davis MRN: [id number] Date of Birth: [birth year]

## 2020-04-20 ENCOUNTER — Ambulatory Visit: Payer: Medicare Other | Admitting: Physical Therapy

## 2020-05-03 ENCOUNTER — Encounter: Payer: Medicare Other | Admitting: Physical Therapy

## 2020-05-05 ENCOUNTER — Encounter: Payer: Medicare Other | Admitting: Physical Therapy

## 2020-05-10 ENCOUNTER — Ambulatory Visit: Payer: Medicare Other | Admitting: Psychiatry

## 2020-05-11 ENCOUNTER — Telehealth (INDEPENDENT_AMBULATORY_CARE_PROVIDER_SITE_OTHER): Payer: Medicare Other | Admitting: Psychiatry

## 2020-05-11 ENCOUNTER — Encounter: Payer: Self-pay | Admitting: Psychiatry

## 2020-05-11 ENCOUNTER — Other Ambulatory Visit: Payer: Self-pay

## 2020-05-11 DIAGNOSIS — F330 Major depressive disorder, recurrent, mild: Secondary | ICD-10-CM | POA: Diagnosis not present

## 2020-05-11 DIAGNOSIS — F411 Generalized anxiety disorder: Secondary | ICD-10-CM | POA: Diagnosis not present

## 2020-05-11 DIAGNOSIS — G4701 Insomnia due to medical condition: Secondary | ICD-10-CM | POA: Insufficient documentation

## 2020-05-11 MED ORDER — MIRTAZAPINE 15 MG PO TABS: 15.0000 mg | ORAL_TABLET | Freq: Every day | ORAL | 0 refills | Status: DC

## 2020-05-11 MED ORDER — BUPROPION HCL 100 MG PO TABS: 100.0000 mg | ORAL_TABLET | Freq: Every morning | ORAL | 1 refills | Status: DC

## 2020-05-11 MED ORDER — TRAZODONE HCL 100 MG PO TABS: 100.0000 mg | ORAL_TABLET | Freq: Every day | ORAL | 0 refills | Status: DC

## 2020-05-11 NOTE — Progress Notes (Signed)
Virtual Visit via Telephone Note  I connected with Traci Davis on 05/11/20 at  2:00 PM EST by telephone and verified that I am speaking with the correct person using two identifiers.  Location Provider Location : [facility] Patient Location : Home  Participants: Patient , Provider   I discussed the limitations, risks, security and privacy concerns of performing an evaluation and management service by telephone and the availability of in person appointments. I also discussed with the patient that there may be a patient responsible charge related to this service. The patient expressed understanding and agreed to proceed.   I discussed the assessment and treatment plan with the patient. The patient was provided an opportunity to ask questions and all were answered. The patient agreed with the plan and demonstrated an understanding of the instructions.   The patient was advised to call back or seek an in-person evaluation if the symptoms worsen or if the condition fails to improve as anticipated.   [place] MD OP Progress Note  05/11/2020 5:30 PM Traci Davis  MRN:  [id number]  Chief Complaint:  Chief Complaint    Follow-up     HPI: Traci Davis is a 50 year old Caucasian female, lives in [place], on [place], has a history of MDD, GAD, insomnia, cerebral palsy, diabetes mellitus was evaluated by internal medicine today.  Patient today reports she is currently struggling with fatigue, low energy during the day.  She also reports feeling sad often.  This has been going on since the past few weeks and getting worse.  She reports she lacks motivation to do anything since she is tired all the time.  Patient reports sleep as restless.  She did not increase the trazodone as discussed last visit.  The trazodone gives her vivid dreams or nightmares and that affects her sleep as well.  Since she is not able to sleep at night that also could be contributing to her tiredness and fatigue.  Patient continues to  have multiple medical problems including pain.  She is on multiple medications including opioid medication as well as muscle relaxant.  Patient is aware that polypharmacy could also be contributing to her tiredness.  Patient denies any suicidality, homicidality or perceptual disturbances.  Patient denies any other concerns today.  Visit Diagnosis:    ICD-10-CM   1. Mild episode of recurrent major depressive disorder (HCC)  F33.0 mirtazapine (REMERON) 15 MG tablet    buPROPion (WELLBUTRIN) 100 MG tablet    DISCONTINUED: traZODone (DESYREL) 100 MG tablet  2. GAD (generalized anxiety disorder)  F41.1 mirtazapine (REMERON) 15 MG tablet  3. Insomnia due to medical condition  G47.01 mirtazapine (REMERON) 15 MG tablet   pain, mood    Past Psychiatric History: I have reviewed past psychiatric history from my progress note on 09/12/2017.  Past trials of Xanax, Klonopin, Cymbalta, Rozerem, Ambien, trazodone  Past Medical History:  Past Medical History:  Diagnosis Date  . Anxiety   . Arthritis   . Asthma   . Cerebral palsy ([place])   . Complication of anesthesia    panic attacks before surgery  . COPD (chronic obstructive pulmonary disease) ([place])   . Depression   . Diabetes mellitus without complication ([place])   . GERD (gastroesophageal reflux disease)   . Hypertension     Past Surgical History:  Procedure Laterality Date  . CESAREAN SECTION  1995  . CHOLECYSTECTOMY    . DILATATION & CURETTAGE/HYSTEROSCOPY WITH MYOSURE N/A 02/20/2018   Procedure: DILATATION & CURETTAGE/HYSTEROSCOPY WITH  MYOSURE ENDOMETRIAL POLYPECTOMY;  Surgeon: Conard Novak, MD;  Location: [facility] ORS;  Service: Gynecology;  Laterality: N/A;  . ESOPHAGOGASTRODUODENOSCOPY (EGD) WITH PROPOFOL N/A 01/01/2019   Procedure: ESOPHAGOGASTRODUODENOSCOPY (EGD) WITH PROPOFOL;  Surgeon: Toney Reil, MD;  Location[facility] ENDOSCOPY;  Service: Gastroenterology;  Laterality: N/A;  . FOOT CAPSULE RELEASE W/ PERCUTANEOUS HEEL CORD  LENGTHENING, TIBIAL TENDON TRANSFER     age 68 ,and 2010-both legs  . JOINT REPLACEMENT Right    both knees partial replacements dates unknown  . knee replacment     x 5 per pt. last one- left knee 2014. has had 2 on R 3 on L  . TYMPANOPLASTY WITH GRAFT Right 01/08/2018   Procedure: TYMPANOPLASTY WITH POSSIBLE OSSICULAR CHAIN RECONSTRUCTION;  Surgeon: Geanie Logan, MD;  Location: [facility] ORS;  Service: ENT;  Laterality: Right;    Family Psychiatric History: I have reviewed family psychiatric history from my progress note on 09/12/2017  Family History:  Family History  Problem Relation Age of Onset  . CAD Father   . Heart attack Brother   . Sexual abuse Paternal Grandfather   . Breast cancer Neg Hx     Social History: I have reviewed social history from my progress note on 09/12/2017 Social History   Socioeconomic History  . Marital status: Single    Spouse name: Not on file  . Number of children: Not on file  . Years of education: Not on file  . Highest education level: Not on file  Occupational History  . Not on file  Tobacco Use  . Smoking status: Never Smoker  . Smokeless tobacco: Never Used  Vaping Use  . Vaping Use: Never used  Substance and Sexual Activity  . Alcohol use: No  . Drug use: No  . Sexual activity: Yes    Partners: Male    Birth control/protection: Condom  Other Topics Concern  . Not on file  Social History Narrative  . Not on file   Social Determinants of Health   Financial Resource Strain: Not on file  Food Insecurity: Not on file  Transportation Needs: Not on file  Physical Activity: Not on file  Stress: Not on file  Social Connections: Not on file    Allergies:  Allergies  Allergen Reactions  . Meloxicam Other (See Comments)    Damage kidney  . Morphine And Related Other (See Comments)    hallucinations  . Vantin [Cefpodoxime] Nausea And Vomiting  . Latex Itching  . Baclofen Other (See Comments)    "makes cerebral palsy do adverse  reaction on me", tightens muscles   . Ciprofloxacin Itching and Nausea And Vomiting  . Tape Rash    skin tears.  Paper tape is ok  . Tramadol Nausea Only    Metabolic Disorder Labs: Lab Results  Component Value Date   HGBA1C 6.1 (H) 02/19/2015   No results found for: PROLACTIN No results found for: CHOL, TRIG, HDL, CHOLHDL, VLDL, LDLCALC Lab Results  Component Value Date   TSH 2.198 07/10/2016   TSH 2.891 02/19/2015    Therapeutic Level Labs: No results found for: LITHIUM No results found for: VALPROATE No components found for:  CBMZ  Current Medications: Current Outpatient Medications  Medication Sig Dispense Refill  . buPROPion (WELLBUTRIN) 100 MG tablet Take 1 tablet (100 mg total) by mouth every morning. 90 tablet 1  . mirtazapine (REMERON) 15 MG tablet Take 1 tablet (15 mg total) by mouth at bedtime. For sleep 30 tablet 0  .  oxyCODONE ER 9 MG C12A Take by mouth.    Marland Kitchen acetaminophen (TYLENOL) 325 MG tablet Take 2 tablets (650 mg total) by mouth every 6 (six) hours as needed. (Patient not taking: Reported on 03/22/2020) 60 tablet 0  . albuterol (PROVENTIL HFA;VENTOLIN HFA) 108 (90 BASE) MCG/ACT inhaler Inhale 2 puffs into the lungs 4 (four) times daily as needed. For shortness of breath and/or wheezing    . ANORO ELLIPTA 62.5-25 MCG/INH AEPB     . atorvastatin (LIPITOR) 20 MG tablet Take 20 mg by mouth daily.    . Blood Glucose Monitoring Suppl (GLUCOCOM BLOOD GLUCOSE MONITOR) DEVI Test daily before all meals/snacks and once before bedtime.    . Chlorzoxazone 750 MG TABS Take by mouth in the morning and at bedtime. 1 1/2 tabs BID    . CIPRODEX OTIC suspension     . CREON 36000 units CPEP capsule     . diazepam (VALIUM) 5 MG tablet Take by mouth.    . diclofenac sodium (VOLTAREN) 1 % GEL Apply 1 application topically as needed (apply to painful areas of chest wall). 100 g 0  . diclofenac Sodium (VOLTAREN) 1 % GEL Apply 1 application topically 4 (four) times daily. (Patient  not taking: Reported on 10/15/2019)    . dicyclomine (BENTYL) 10 MG capsule Take 10 mg by mouth as needed.     . donepezil (ARICEPT) 10 MG tablet Take 10 mg by mouth at bedtime.     Marland Kitchen FLUoxetine (PROZAC) 20 MG capsule Take 1 capsule (20 mg total) by mouth daily. To be combined with 40 mg 90 capsule 1  . FLUoxetine (PROZAC) 40 MG capsule Take 1 capsule (40 mg total) by mouth daily. To be combined with 20 mg 90 capsule 1  . furosemide (LASIX) 40 MG tablet Take 40 mg by mouth daily as needed for fluid.     Marland Kitchen glimepiride (AMARYL) 4 MG tablet Take 4 mg by mouth daily with breakfast. (Patient not taking: Reported on 03/02/2020)    . glucose blood (ONE TOUCH ULTRA TEST) test strip USE TO TEST BLOOD SUGAR TWO TIMES A DAY    . glucose blood test strip USE TO TEST BLOOD SUGAR TWO TIMES A DAY    . hydrOXYzine (VISTARIL) 50 MG capsule Take 1-2 capsules (50-100 mg total) by mouth daily as needed. For severe panic attacks only 180 capsule 1  . incobotulinumtoxinA (XEOMIN) 100 units SOLR injection Inject 100 Units into the muscle every 3 (three) months.     . Lancets (FREESTYLE) lancets Test daily before all meals/snacks    . lidocaine (LIDODERM) 5 % Place onto the skin as needed.     . lidocaine (LIDODERM) 5 % Place onto the skin.     . metFORMIN (GLUCOPHAGE) 500 MG tablet Take 500 mg by mouth daily.    . metFORMIN (GLUCOPHAGE-XR) 500 MG 24 hr tablet Take by mouth.    . naloxone (NARCAN) nasal spray 4 mg/0.1 mL One spray in either nostril once for known/suspected opioid overdose. May repeat every 2-3 minutes in alternating nostril til EMS arrives (Patient not taking: Reported on 03/02/2020)    . nitrofurantoin (MACRODANTIN) 100 MG capsule     . nystatin (MYCOSTATIN/NYSTOP) powder Apply 1 g topically daily as needed (rash).     Marland Kitchen omeprazole (PRILOSEC) 40 MG capsule Take 40 mg by mouth daily.     . ondansetron (ZOFRAN-ODT) 4 MG disintegrating tablet Take 4 mg by mouth every 8 (eight) hours as needed.    Marland Kitchen  oxyCODONE (OXY IR/ROXICODONE) 5 MG immediate release tablet Take 5 mg by mouth every 6 (six) hours as needed.     Marland Kitchen oxyCODONE ER (XTAMPZA ER) 9 MG C12A Take 9 mg by mouth daily.    . solifenacin (VESICARE) 10 MG tablet Take 10 mg by mouth daily.     No current facility-administered medications for this visit.     Musculoskeletal: Strength & Muscle Tone: UTA Gait & Station: UTA Patient leans: N/A  Psychiatric Specialty Exam: Review of Systems  Constitutional: Positive for fatigue.  Musculoskeletal: Positive for arthralgias.  Psychiatric/Behavioral: Positive for dysphoric mood and sleep disturbance.  All other systems reviewed and are negative.   Last menstrual period 08/20/2014.There is no height or weight on file to calculate BMI.  General Appearance: UTA  Eye Contact:  UTA  Speech:  Clear and Coherent  Volume:  Normal  Mood:  Dysphoric  Affect:  UTA  Thought Process:  Goal Directed and Descriptions of Associations: Intact  Orientation:  Full (Time, Place, and Person)  Thought Content: Logical   Suicidal Thoughts:  No  Homicidal Thoughts:  No  Memory:  Immediate;   Fair Recent;   Fair Remote;   Fair  Judgement:  Fair  Insight:  Fair  Psychomotor Activity:  UTA  Concentration:  Concentration: Fair and Attention Span: Fair  Recall:  [employer] of Knowledge: Fair  Language: Fair  Akathisia:  No  Handed:  Right  AIMS (if indicated): UTA  Assets:  Communication Skills Desire for Improvement Housing Social Support  ADL's:  Intact  Cognition: WNL  Sleep:  Poor   Screenings: PHQ2-9   Flowsheet Row Video Visit from 05/11/2020 in [place]  PHQ-2 Total Score 4  PHQ-9 Total Score 14       Assessment and Plan: Traci Davis is a 50 year old Caucasian female who has a history of depression, GAD, multiple medical problems including pain was evaluated by internal medicine today.  Patient is currently struggling with sleep problems,  fatigue, tiredness and lack of motivation.  She will benefit from the following plan.  Plan Depression-unstable Continue Prozac 60 mg p.o. daily Continue Wellbutrin 100 mg p.o. daily in the morning. Will consider increasing the Wellbutrin in the future. Start mirtazapine 15 mg p.o. nightly for sleep Discontinue trazodone for side effects and lack of benefit.  GAD- stable Continue Prozac as prescribed. Continue hydroxyzine 50 to 100 mg p.o. nightly as needed  Insomnia-unstable Discontinue trazodone. Start mirtazapine 15 mg p.o. nightly.  Patient's  sleep problems likely multifactorial including not having a good sleep hygiene, pain, frequent urination.  Discussed sleep hygiene techniques.  We will continue to reevaluate.  Follow-up in clinic in 3 to 4 weeks or sooner if needed.  I have spent atleast 20 minutes non face to face  with patient today. More than 50 % of the time was spent for preparing to see the patient ( e.g., review of test, records ),ordering medications and test ,psychoeducation and supportive psychotherapy and care coordination,as well as documenting clinical information in electronic health record. This note was generated in part or whole with voice recognition software. Voice recognition is usually quite accurate but there are transcription errors that can and very often do occur. I apologize for any typographical errors that were not detected and corrected.           Ursula Alert, MD 05/11/2020, 5:30 PM

## 2020-05-11 NOTE — Patient Instructions (Signed)
Mirtazapine tablets What is this medicine? MIRTAZAPINE (mir TAZ a peen) is used to treat depression. This medicine may be used for other purposes; ask your health care provider or pharmacist if you have questions. COMMON BRAND NAME(S): Remeron What should I tell my health care provider before I take this medicine? They need to know if you have any of these conditions:  bipolar disorder  glaucoma  kidney disease  liver disease  suicidal thoughts  an unusual or allergic reaction to mirtazapine, other medicines, foods, dyes, or preservatives  pregnant or trying to get pregnant  breast-feeding How should I use this medicine? Take this medicine by mouth with a glass of water. Follow the directions on the prescription label. Take your medicine at regular intervals. Do not take your medicine more often than directed. Do not stop taking this medicine suddenly except upon the advice of your doctor. Stopping this medicine too quickly may cause serious side effects or your condition may worsen. A special MedGuide will be given to you by the pharmacist with each prescription and refill. Be sure to read this information carefully each time. Talk to your pediatrician regarding the use of this medicine in children. Special care may be needed. Overdosage: If you think you have taken too much of this medicine contact a poison control center or emergency room at once. NOTE: This medicine is only for you. Do not share this medicine with others. What if I miss a dose? If you miss a dose, take it as soon as you can. If it is almost time for your next dose, take only that dose. Do not take double or extra doses. What may interact with this medicine? Do not take this medicine with any of the following medications:  linezolid  MAOIs like Carbex, Eldepryl, Marplan, Nardil, and Parnate  methylene blue (injected into a vein) This medicine may also interact with the following  medications:  alcohol  antiviral medicines for HIV or AIDS  certain medicines that treat or prevent blood clots like warfarin  certain medicines for depression, anxiety, or psychotic disturbances  certain medicines for fungal infections like ketoconazole and itraconazole  certain medicines for migraine headache like almotriptan, eletriptan, frovatriptan, naratriptan, rizatriptan, sumatriptan, zolmitriptan  certain medicines for seizures like carbamazepine or phenytoin  certain medicines for sleep  cimetidine  erythromycin  fentanyl  lithium  medicines for blood pressure  nefazodone  rasagiline  rifampin  supplements like St. John's wort, kava kava, valerian  tramadol  tryptophan This list may not describe all possible interactions. Give your health care provider a list of all the medicines, herbs, non-prescription drugs, or dietary supplements you use. Also tell them if you smoke, drink alcohol, or use illegal drugs. Some items may interact with your medicine. What should I watch for while using this medicine? Tell your doctor if your symptoms do not get better or if they get worse. Visit your doctor or health care professional for regular checks on your progress. Because it may take several weeks to see the full effects of this medicine, it is important to continue your treatment as prescribed by your doctor. Patients and their families should watch out for new or worsening thoughts of suicide or depression. Also watch out for sudden changes in feelings such as feeling anxious, agitated, panicky, irritable, hostile, aggressive, impulsive, severely restless, overly excited and hyperactive, or not being able to sleep. If this happens, especially at the beginning of treatment or after a change in dose, call your health   care professional. Dennis Bast may get drowsy or dizzy. Do not drive, use machinery, or do anything that needs mental alertness until you know how this medicine  affects you. Do not stand or sit up quickly, especially if you are an older patient. This reduces the risk of dizzy or fainting spells. Alcohol may interfere with the effect of this medicine. Avoid alcoholic drinks. This medicine may cause dry eyes and blurred vision. If you wear contact lenses you may feel some discomfort. Lubricating drops may help. See your eye doctor if the problem does not go away or is severe. Your mouth may get dry. Chewing sugarless gum or sucking hard candy, and drinking plenty of water may help. Contact your doctor if the problem does not go away or is severe. What side effects may I notice from receiving this medicine? Side effects that you should report to your doctor or health care professional as soon as possible:  allergic reactions like skin rash, itching or hives, swelling of the face, lips, or tongue  anxious  changes in vision  chest pain  confusion  elevated mood, decreased need for sleep, racing thoughts, impulsive behavior  eye pain  fast, irregular heartbeat  feeling faint or lightheaded, falls  feeling agitated, angry, or irritable  fever or chills, sore throat  hallucination, loss of contact with reality  loss of balance or coordination  mouth sores  redness, blistering, peeling or loosening of the skin, including inside the mouth  restlessness, pacing, inability to keep still  seizures  stiff muscles  suicidal thoughts or other mood changes  trouble passing urine or change in the amount of urine  trouble sleeping  unusual bleeding or bruising  unusually weak or tired  vomiting Side effects that usually do not require medical attention (report to your doctor or health care professional if they continue or are bothersome):  change in appetite  constipation  dizziness  dry mouth  muscle aches or pains  nausea  tired  weight gain This list may not describe all possible side effects. Call your doctor for  medical advice about side effects. You may report side effects to FDA at 1-800-FDA-1088. Where should I keep my medicine? Keep out of the reach of children. Store at room temperature between 15 and 30 degrees C (59 and 86 degrees F) Protect from light and moisture. Throw away any unused medicine after the expiration date. NOTE: This sheet is a summary. It may not cover all possible information. If you have questions about this medicine, talk to your doctor, pharmacist, or health care provider.  2021 Elsevier/Gold Standard (2015-09-15 17:30:45)   Telehealth [email or web address]

## 2020-05-12 ENCOUNTER — Encounter: Payer: Medicare Other | Admitting: Physical Therapy

## 2020-05-18 ENCOUNTER — Encounter: Payer: Medicare Other | Admitting: Physical Therapy

## 2020-05-25 ENCOUNTER — Encounter: Payer: Medicare Other | Admitting: Physical Therapy

## 2020-05-30 ENCOUNTER — Encounter: Payer: Medicare Other | Admitting: Physical Therapy

## 2020-06-01 ENCOUNTER — Encounter: Payer: Self-pay | Admitting: Psychiatry

## 2020-06-01 ENCOUNTER — Other Ambulatory Visit: Payer: Self-pay

## 2020-06-01 ENCOUNTER — Encounter: Payer: Medicare Other | Admitting: Physical Therapy

## 2020-06-01 ENCOUNTER — Telehealth (INDEPENDENT_AMBULATORY_CARE_PROVIDER_SITE_OTHER): Payer: Medicare Other | Admitting: Psychiatry

## 2020-06-01 DIAGNOSIS — G4701 Insomnia due to medical condition: Secondary | ICD-10-CM | POA: Diagnosis not present

## 2020-06-01 DIAGNOSIS — F330 Major depressive disorder, recurrent, mild: Secondary | ICD-10-CM

## 2020-06-01 DIAGNOSIS — F411 Generalized anxiety disorder: Secondary | ICD-10-CM

## 2020-06-01 MED ORDER — MIRTAZAPINE 15 MG PO TABS: 15.0000 mg | ORAL_TABLET | Freq: Every day | ORAL | 0 refills | Status: DC

## 2020-06-01 NOTE — Patient Instructions (Signed)
COVID-19 Quarantine vs. Isolation QUARANTINE keeps someone who was in close contact with someone who has COVID-19 away from others. Quarantine if you have been in close contact with someone who has COVID-19, unless you have been fully vaccinated. If you are fully vaccinated  You do NOT need to quarantine unless they have symptoms  Get tested 3-5 days after your exposure, even if you don't have symptoms  Wear a mask indoors in public for 14 days following exposure or until your test result is negative If you are not fully vaccinated  Stay home for 14 days after your last contact with a person who has COVID-19  Watch for fever (100.4F), cough, shortness of breath, or other symptoms of COVID-19  If possible, stay away from people you live with, especially people who are at higher risk for getting very sick from COVID-19  Contact your local public health department for options in your area to possibly shorten your quarantine ISOLATION keeps someone who is sick or tested positive for COVID-19 without symptoms away from others, even in their own home. People who are in isolation should stay home and stay in a specific "sick room" or area and use a separate bathroom (if available). If you are sick and think or know you have COVID-19 Stay home until after  At least 10 days since symptoms first appeared and  At least 24 hours with no fever without the use of fever-reducing medications and  Symptoms have improved If you tested positive for COVID-19 but do not have symptoms  Stay home until after 10 days have passed since your positive viral test  If you develop symptoms after testing positive, follow the steps above for those who are sick cdc.gov/coronavirus 01/25/2020 This information is not intended to replace advice given to you by your health care provider. Make sure you discuss any questions you have with your health care provider. Document Revised: 02/29/2020 Document Reviewed:  02/29/2020 Elsevier Patient Education  2021 Elsevier Inc.  

## 2020-06-01 NOTE — Progress Notes (Signed)
Virtual Visit via Video Note  I connected with Traci Davis on 06/01/20 at 11:20 AM EST by a video enabled telemedicine application and verified that I am speaking with the correct person using two identifiers.  Location Provider Location : [facility] Patient Location : Home  Participants: Patient , Provider    I discussed the limitations of evaluation and management by telemedicine and the availability of in person appointments. The patient expressed understanding and agreed to proceed.    I discussed the assessment and treatment plan with the patient. The patient was provided an opportunity to ask questions and all were answered. The patient agreed with the plan and demonstrated an understanding of the instructions.   The patient was advised to call back or seek an in-person evaluation if the symptoms worsen or if the condition fails to improve as anticipated. [place] MD OP Progress Note  06/01/2020 1:52 PM Traci Davis  MRN:  [id number]  Chief Complaint:  Chief Complaint    Follow-up     HPI: Traci Davis is a 50 year old Caucasian female, lives in [place] [address], has a history of MDD, GAD, insomnia, cerebral palsy, diabetes melitis was evaluated by telemedicine today.  Patient today reports she is currently struggling with COVID-19 infection.  She reports she got tested positive on January 25.  She reports she currently is struggling with chills, fatigue, nausea, vomiting, headache, congestion.  She reports she is unable to keep anything down due to the nausea.  She has trouble taking her medications at this time.  She reports she was advised by her providers to take zinc and vitamin C.  She however reports since she is nauseous she is unable to take these pills.  She agrees to get in touch with her primary provider for management.  Patient reports she is currently worried and sad about her current health issues.  Patient today reports the mirtazapine as beneficial when she started it.   She did take it for a few days and it helped her to sleep.  However since being infected with COVID-19 she has been having restless sleep.  Patient did not express any suicidality, homicidality or perceptual disturbances.  Patient denies any other concerns today.  Visit Diagnosis:    ICD-10-CM   1. Mild episode of recurrent major depressive disorder (HCC)  F33.0 mirtazapine (REMERON) 15 MG tablet  2. GAD (generalized anxiety disorder)  F41.1 mirtazapine (REMERON) 15 MG tablet  3. Insomnia due to medical condition  G47.01 mirtazapine (REMERON) 15 MG tablet   pain, mood    Past Psychiatric History: I have reviewed past psychiatric history from my progress note on 09/12/2017.  Past trials of Xanax, Klonopin, Cymbalta, Rozerem, Ambien, trazodone  Past Medical History:  Past Medical History:  Diagnosis Date  . Anxiety   . Arthritis   . Asthma   . Cerebral palsy ([place])   . Complication of anesthesia    panic attacks before surgery  . COPD (chronic obstructive pulmonary disease) ([place])   . Depression   . Diabetes mellitus without complication ([place])   . GERD (gastroesophageal reflux disease)   . Hypertension     Past Surgical History:  Procedure Laterality Date  . CESAREAN SECTION  1995  . CHOLECYSTECTOMY    . DILATATION & CURETTAGE/HYSTEROSCOPY WITH MYOSURE N/A 02/20/2018   Procedure: DILATATION & CURETTAGE/HYSTEROSCOPY WITH MYOSURE ENDOMETRIAL POLYPECTOMY;  Surgeon: Will Bonnet, MD;  Location: [facility] ORS;  Service: Gynecology;  Laterality: N/A;  . ESOPHAGOGASTRODUODENOSCOPY (EGD) WITH PROPOFOL  N/A 01/01/2019   Procedure: ESOPHAGOGASTRODUODENOSCOPY (EGD) WITH PROPOFOL;  Surgeon: Lin Landsman, MD;  Location: [facility] ENDOSCOPY;  Service: Gastroenterology;  Laterality: N/A;  . FOOT CAPSULE RELEASE W/ PERCUTANEOUS HEEL CORD LENGTHENING, TIBIAL TENDON TRANSFER     age 48 ,and 2010-both legs  . JOINT REPLACEMENT Right    both knees partial replacements dates unknown  . knee  replacment     x 5 per pt. last one- left knee 2014. has had 2 on R 3 on L  . TYMPANOPLASTY WITH GRAFT Right 01/08/2018   Procedure: TYMPANOPLASTY WITH POSSIBLE OSSICULAR CHAIN RECONSTRUCTION;  Surgeon: [name], MD;  Location: [facility] ORS;  Service: ENT;  Laterality: Right;    Family Psychiatric History: I have reviewed family psychiatric history from my progress note on 09/12/2017.  Family History:  Family History  Problem Relation Age of Onset  . CAD Father   . Heart attack Brother   . Sexual abuse Paternal Grandfather   . Breast cancer Neg Hx     Social History: I have reviewed social history from my progress note on 09/12/2017. Social History   Socioeconomic History  . Marital status: Single    Spouse name: Not on file  . Number of children: Not on file  . Years of education: Not on file  . Highest education level: Not on file  Occupational History  . Not on file  Tobacco Use  . Smoking status: Never Smoker  . Smokeless tobacco: Never Used  Vaping Use  . Vaping Use: Never used  Substance and Sexual Activity  . Alcohol use: No  . Drug use: No  . Sexual activity: Yes    Partners: Male    Birth control/protection: Condom  Other Topics Concern  . Not on file  Social History Narrative  . Not on file   Social Determinants of Health   Financial Resource Strain: Not on file  Food Insecurity: Not on file  Transportation Needs: Not on file  Physical Activity: Not on file  Stress: Not on file  Social Connections: Not on file    Allergies:  Allergies  Allergen Reactions  . Meloxicam Other (See Comments)    Damage kidney  . Morphine And Related Other (See Comments)    hallucinations  . Vantin [Cefpodoxime] Nausea And Vomiting  . Latex Itching  . Baclofen Other (See Comments)    "makes cerebral palsy do adverse reaction on me", tightens muscles   . Ciprofloxacin Itching and Nausea And Vomiting  . Tape Rash    skin tears.  Paper tape is ok  . Tramadol Nausea  Only    Metabolic Disorder Labs: Lab Results  Component Value Date   HGBA1C 6.1 (H) 02/19/2015   No results found for: PROLACTIN No results found for: CHOL, TRIG, HDL, CHOLHDL, VLDL, LDLCALC Lab Results  Component Value Date   TSH 2.198 07/10/2016   TSH 2.891 02/19/2015    Therapeutic Level Labs: No results found for: LITHIUM No results found for: VALPROATE No components found for:  CBMZ  Current Medications: Current Outpatient Medications  Medication Sig Dispense Refill  . acetaminophen (TYLENOL) 325 MG tablet Take 2 tablets (650 mg total) by mouth every 6 (six) hours as needed. (Patient not taking: Reported on 03/22/2020) 60 tablet 0  . albuterol (PROVENTIL HFA;VENTOLIN HFA) 108 (90 BASE) MCG/ACT inhaler Inhale 2 puffs into the lungs 4 (four) times daily as needed. For shortness of breath and/or wheezing    . ANORO ELLIPTA 62.5-25 MCG/INH AEPB     .  atorvastatin (LIPITOR) 20 MG tablet Take 20 mg by mouth daily.    . Blood Glucose Monitoring Suppl (GLUCOCOM BLOOD GLUCOSE MONITOR) DEVI Test daily before all meals/snacks and once before bedtime.    Marland Kitchen buPROPion (WELLBUTRIN) 100 MG tablet Take 1 tablet (100 mg total) by mouth every morning. 90 tablet 1  . Chlorzoxazone 750 MG TABS Take by mouth in the morning and at bedtime. 1 1/2 tabs BID    . CIPRODEX OTIC suspension     . CREON 36000 units CPEP capsule     . diazepam (VALIUM) 5 MG tablet Take by mouth.    . diclofenac sodium (VOLTAREN) 1 % GEL Apply 1 application topically as needed (apply to painful areas of chest wall). 100 g 0  . diclofenac Sodium (VOLTAREN) 1 % GEL Apply 1 application topically 4 (four) times daily. (Patient not taking: Reported on 10/15/2019)    . dicyclomine (BENTYL) 10 MG capsule Take 10 mg by mouth as needed.     . donepezil (ARICEPT) 10 MG tablet Take 10 mg by mouth at bedtime.     Marland Kitchen FLUoxetine (PROZAC) 20 MG capsule Take 1 capsule (20 mg total) by mouth daily. To be combined with 40 mg 90 capsule 1  .  FLUoxetine (PROZAC) 40 MG capsule Take 1 capsule (40 mg total) by mouth daily. To be combined with 20 mg 90 capsule 1  . furosemide (LASIX) 40 MG tablet Take 40 mg by mouth daily as needed for fluid.     Marland Kitchen glimepiride (AMARYL) 4 MG tablet Take 4 mg by mouth daily with breakfast. (Patient not taking: Reported on 03/02/2020)    . glucose blood (ONE TOUCH ULTRA TEST) test strip USE TO TEST BLOOD SUGAR TWO TIMES A DAY    . glucose blood test strip USE TO TEST BLOOD SUGAR TWO TIMES A DAY    . hydrOXYzine (ATARAX/VISTARIL) 50 MG tablet TAKE 1 TABLET 3 TIMES A DAY AS NEEDED FOR ANXIETY    . hydrOXYzine (VISTARIL) 50 MG capsule Take 1-2 capsules (50-100 mg total) by mouth daily as needed. For severe panic attacks only 180 capsule 1  . incobotulinumtoxinA (XEOMIN) 100 units SOLR injection Inject 100 Units into the muscle every 3 (three) months.     . Lancets (FREESTYLE) lancets Test daily before all meals/snacks    . lidocaine (LIDODERM) 5 % Place onto the skin as needed.     . lidocaine (LIDODERM) 5 % Place onto the skin.     Marland Kitchen LIDOPRO 4-4-5 % PTCH 1 patch patch every twelve hours    . LINZESS 145 MCG CAPS capsule Take 1 capsule by mouth every morning  for constipation    . metFORMIN (GLUCOPHAGE) 500 MG tablet Take 500 mg by mouth daily.    . metFORMIN (GLUCOPHAGE-XR) 500 MG 24 hr tablet Take by mouth.    . mirtazapine (REMERON) 15 MG tablet Take 1 tablet (15 mg total) by mouth at bedtime. For sleep 30 tablet 0  . naloxone (NARCAN) nasal spray 4 mg/0.1 mL One spray in either nostril once for known/suspected opioid overdose. May repeat every 2-3 minutes in alternating nostril til EMS arrives (Patient not taking: Reported on 03/02/2020)    . nitrofurantoin (MACRODANTIN) 100 MG capsule     . nystatin (MYCOSTATIN/NYSTOP) powder Apply 1 g topically daily as needed (rash).     Marland Kitchen omeprazole (PRILOSEC) 40 MG capsule Take 40 mg by mouth daily.     . ondansetron (ZOFRAN-ODT) 4 MG disintegrating tablet Take  4 mg by  mouth every 8 (eight) hours as needed.    Marland Kitchen oxyCODONE (OXY IR/ROXICODONE) 5 MG immediate release tablet Take 5 mg by mouth every 6 (six) hours as needed.     Derrill Memo ON 06/13/2020] oxyCODONE (OXY IR/ROXICODONE) 5 MG immediate release tablet Take by mouth.    . oxyCODONE ER (XTAMPZA ER) 9 MG C12A Take 9 mg by mouth daily.    Marland Kitchen oxyCODONE ER 9 MG C12A Take by mouth.    . solifenacin (VESICARE) 10 MG tablet Take 10 mg by mouth daily.     No current facility-administered medications for this visit.     Musculoskeletal: Strength & Muscle Tone: UTA Gait & Station: UTA Patient leans: N/A  Psychiatric Specialty Exam: Review of Systems  Constitutional: Positive for fatigue.  HENT: Positive for congestion.   Respiratory: Positive for cough.   Gastrointestinal: Positive for nausea and vomiting.  Musculoskeletal: Positive for myalgias.  Psychiatric/Behavioral: Positive for sleep disturbance.  All other systems reviewed and are negative.   Last menstrual period 08/20/2014.There is no height or weight on file to calculate BMI.  General Appearance: Casual  Eye Contact:  Fair  Speech:  Clear and Coherent  Volume:  Normal  Mood:  Anxious and Dysphoric  Affect:  Congruent  Thought Process:  Goal Directed and Descriptions of Associations: Intact  Orientation:  Full (Time, Place, and Person)  Thought Content: Logical   Suicidal Thoughts:  No  Homicidal Thoughts:  No  Memory:  Immediate;   Fair Recent;   Fair Remote;   Fair  Judgement:  Fair  Insight:  Fair  Psychomotor Activity:  Normal  Concentration:  Concentration: Fair and Attention Span: Fair  Recall:  [employer] of Knowledge: Fair  Language: Fair  Akathisia:  No  Handed:  Right  AIMS (if indicated): UTA  Assets:  Communication Skills Desire for Improvement Housing Social Support  ADL's:  Intact  Cognition: WNL  Sleep:  Restless since she is currently struggling with COVID 19   Screenings: PHQ2-9   Flowsheet Row Video  Visit from 05/11/2020 in [place]  PHQ-2 Total Score 4  PHQ-9 Total Score 14       Assessment and Plan: MIRREN GEST is a 50 year old Caucasian female who has a history of depression, GAD, medical problems including pain was evaluated by telemedicine today.  Patient is currently struggling with COVID-19 infection.  Patient will benefit from the following plan.  Plan Depression-unstable Continue Prozac 60 mg p.o. daily Wellbutrin 100 mg p.o. daily in the morning We will consider increasing Wellbutrin in the future . Mirtazapine 15 mg p.o. nightly for sleep  GAD-unstable Prozac as prescribed. Hydroxyzine 50 to 100 mg p.o. nightly as needed Patient is currently worried about her health problems.  Insomnia-unstable Mirtazapine 15 mg p.o. nightly  Will not make any changes with her medications today since she is currently having COVID-19 symptoms.  Will reevaluate patient in 3 to 4 weeks.  Patient encouraged to get in touch with her primary care provider for her nausea and vomiting and other symptoms.  Follow-up in clinic in 3 to 4 weeks or sooner if needed.  I have spent atleast 10 minutes face to face by video with patient today. More than 50 % of the time was spent for preparing to see the patient ( e.g., review of test, records ), ordering medications and test ,psychoeducation and supportive psychotherapy and care coordination,as well as documenting clinical information in electronic health  record. This note was generated in part or whole with voice recognition software. Voice recognition is usually quite accurate but there are transcription errors that can and very often do occur. I apologize for any typographical errors that were not detected and corrected.        Ursula Alert, MD 06/01/2020, 1:52 PM

## 2020-06-30 ENCOUNTER — Encounter: Payer: Self-pay | Admitting: Psychiatry

## 2020-06-30 ENCOUNTER — Telehealth (INDEPENDENT_AMBULATORY_CARE_PROVIDER_SITE_OTHER): Payer: Medicare Other | Admitting: Psychiatry

## 2020-06-30 ENCOUNTER — Other Ambulatory Visit: Payer: Self-pay

## 2020-06-30 DIAGNOSIS — G4701 Insomnia due to medical condition: Secondary | ICD-10-CM | POA: Diagnosis not present

## 2020-06-30 DIAGNOSIS — F411 Generalized anxiety disorder: Secondary | ICD-10-CM | POA: Diagnosis not present

## 2020-06-30 DIAGNOSIS — F3342 Major depressive disorder, recurrent, in full remission: Secondary | ICD-10-CM | POA: Diagnosis not present

## 2020-06-30 MED ORDER — FLUOXETINE HCL 20 MG PO CAPS: 20.0000 mg | ORAL_CAPSULE | Freq: Every day | ORAL | 1 refills | Status: DC

## 2020-06-30 MED ORDER — FLUOXETINE HCL 40 MG PO CAPS: 40.0000 mg | ORAL_CAPSULE | Freq: Every day | ORAL | 1 refills | Status: DC

## 2020-06-30 MED ORDER — MIRTAZAPINE 15 MG PO TABS: 15.0000 mg | ORAL_TABLET | Freq: Every day | ORAL | 0 refills | Status: DC

## 2020-06-30 NOTE — Progress Notes (Unsigned)
Virtual Visit via Video Note  I connected with Traci Davis on 06/30/20 at  4:00 PM EST by a video enabled telemedicine application and verified that I am speaking with the correct person using two identifiers.  Location Provider Location : [facility] Patient Location : Home  Participants: Patient , Provider    I discussed the limitations of evaluation and management by telemedicine and the availability of in person appointments. The patient expressed understanding and agreed to proceed.   I discussed the assessment and treatment plan with the patient. The patient was provided an opportunity to ask questions and all were answered. The patient agreed with the plan and demonstrated an understanding of the instructions.   The patient was advised to call back or seek an in-person evaluation if the symptoms worsen or if the condition fails to improve as anticipated.   [facility] MD OP Progress Note  06/30/2020 4:27 PM Traci Davis  MRN:  [id number]  Chief Complaint:  Chief Complaint    Follow-up; Anxiety     HPI: Traci Davis is a 50 year old Caucasian female, lives in [place], on [place], has a history of MDD, GAD, insomnia, cerebral palsy, diabetes melitis was evaluated by telemedicine today.  Patient today reports she is currently making progress with regards to her anxiety symptoms.  She does have her moments however overall is coping okay.  She currently struggles with neck pain.  She reports she recently got an injection to her neck and is on oxycodone.  In spite of that she is currently going through a flareup.  Patient reports that does make her anxious.  She is planning to get in touch with her pain provider-Dr. Hardin Negus.  Patient reports she recovered well from the COVID-19 infection.  Currently does not have any residual symptoms.  Patient reports sleep is overall okay.  Patient denies any suicidality, homicidality or perceptual disturbances.  She reports she feels more motivated since  she is spending 2 to 3 hours with her grandchild daily.  She reports she throws a ball for her sitting on her wheelchair and they play together and that helps her to stay happy.  Patient reports support system from her boyfriend who lives with her.  Patient does report short-term memory loss however was able to answer questions appropriately and appeared to be alert oriented to person place time and situation today.  Patient denies any other concerns today.  Visit Diagnosis:    ICD-10-CM   1. MDD (major depressive disorder), recurrent, in full remission ([place])  F33.42 mirtazapine (REMERON) 15 MG tablet  2. GAD (generalized anxiety disorder)  F41.1 mirtazapine (REMERON) 15 MG tablet    FLUoxetine (PROZAC) 20 MG capsule    FLUoxetine (PROZAC) 40 MG capsule  3. Insomnia due to medical condition  G47.01 mirtazapine (REMERON) 15 MG tablet   pain, mood    Past Psychiatric History: I have reviewed past psychiatric history from my progress note on 09/12/2017.  Past trials of Xanax, Klonopin, Cymbalta, Rozerem, Ambien, trazodone  Past Medical History:  Past Medical History:  Diagnosis Date  . Anxiety   . Arthritis   . Asthma   . Cerebral palsy ([place])   . Complication of anesthesia    panic attacks before surgery  . COPD (chronic obstructive pulmonary disease) ([place])   . Depression   . Diabetes mellitus without complication ([place])   . GERD (gastroesophageal reflux disease)   . Hypertension     Past Surgical History:  Procedure Laterality Date  .  CESAREAN SECTION  1995  . CHOLECYSTECTOMY    . DILATATION & CURETTAGE/HYSTEROSCOPY WITH MYOSURE N/A 02/20/2018   Procedure: DILATATION & CURETTAGE/HYSTEROSCOPY WITH MYOSURE ENDOMETRIAL POLYPECTOMY;  Surgeon: Will Bonnet, MD;  Location: [facility] ORS;  Service: Gynecology;  Laterality: N/A;  . ESOPHAGOGASTRODUODENOSCOPY (EGD) WITH PROPOFOL N/A 01/01/2019   Procedure: ESOPHAGOGASTRODUODENOSCOPY (EGD) WITH PROPOFOL;  Surgeon: Lin Landsman, MD;   Location: [facility] ENDOSCOPY;  Service: Gastroenterology;  Laterality: N/A;  . FOOT CAPSULE RELEASE W/ PERCUTANEOUS HEEL CORD LENGTHENING, TIBIAL TENDON TRANSFER     age 21 ,and 2010-both legs  . JOINT REPLACEMENT Right    both knees partial replacements dates unknown  . knee replacment     x 5 per pt. last one- left knee 2014. has had 2 on R 3 on L  . TYMPANOPLASTY WITH GRAFT Right 01/08/2018   Procedure: TYMPANOPLASTY WITH POSSIBLE OSSICULAR CHAIN RECONSTRUCTION;  Surgeon: [name], MD;  Location: [facility] ORS;  Service: ENT;  Laterality: Right;    Family Psychiatric History: I have reviewed family psychiatric history from my progress note on 09/12/2017.  Family History:  Family History  Problem Relation Age of Onset  . CAD Father   . Heart attack Brother   . Sexual abuse Paternal Grandfather   . Breast cancer Neg Hx     Social History: I have reviewed social history from my progress note on 09/12/2017. Social History   Socioeconomic History  . Marital status: Single    Spouse name: Not on file  . Number of children: Not on file  . Years of education: Not on file  . Highest education level: Not on file  Occupational History  . Not on file  Tobacco Use  . Smoking status: Never Smoker  . Smokeless tobacco: Never Used  Vaping Use  . Vaping Use: Never used  Substance and Sexual Activity  . Alcohol use: No  . Drug use: No  . Sexual activity: Yes    Partners: Male    Birth control/protection: Condom  Other Topics Concern  . Not on file  Social History Narrative  . Not on file   Social Determinants of Health   Financial Resource Strain: Not on file  Food Insecurity: Not on file  Transportation Needs: Not on file  Physical Activity: Not on file  Stress: Not on file  Social Connections: Not on file    Allergies:  Allergies  Allergen Reactions  . Meloxicam Other (See Comments)    Damage kidney  . Morphine And Related Other (See Comments)    hallucinations  . Vantin  [Cefpodoxime] Nausea And Vomiting  . Latex Itching  . Baclofen Other (See Comments)    "makes cerebral palsy do adverse reaction on me", tightens muscles   . Ciprofloxacin Itching and Nausea And Vomiting  . Tape Rash    skin tears.  Paper tape is ok  . Tramadol Nausea Only    Metabolic Disorder Labs: Lab Results  Component Value Date   HGBA1C 6.1 (H) 02/19/2015   No results found for: PROLACTIN No results found for: CHOL, TRIG, HDL, CHOLHDL, VLDL, LDLCALC Lab Results  Component Value Date   TSH 2.198 07/10/2016   TSH 2.891 02/19/2015    Therapeutic Level Labs: No results found for: LITHIUM No results found for: VALPROATE No components found for:  CBMZ  Current Medications: Current Outpatient Medications  Medication Sig Dispense Refill  . acetaminophen (TYLENOL) 325 MG tablet Take 2 tablets (650 mg total) by mouth every 6 (six) hours  as needed. (Patient not taking: Reported on 03/22/2020) 60 tablet 0  . albuterol (PROVENTIL HFA;VENTOLIN HFA) 108 (90 BASE) MCG/ACT inhaler Inhale 2 puffs into the lungs 4 (four) times daily as needed. For shortness of breath and/or wheezing    . ANORO ELLIPTA 62.5-25 MCG/INH AEPB     . atorvastatin (LIPITOR) 20 MG tablet Take 20 mg by mouth daily.    . Blood Glucose Monitoring Suppl (GLUCOCOM BLOOD GLUCOSE MONITOR) DEVI Test daily before all meals/snacks and once before bedtime.    Marland Kitchen buPROPion (WELLBUTRIN) 100 MG tablet Take 1 tablet (100 mg total) by mouth every morning. 90 tablet 1  . Chlorzoxazone 750 MG TABS Take by mouth in the morning and at bedtime. 1 1/2 tabs BID    . CIPRODEX OTIC suspension     . CREON 36000 units CPEP capsule     . diazepam (VALIUM) 5 MG tablet Take by mouth.    . diclofenac sodium (VOLTAREN) 1 % GEL Apply 1 application topically as needed (apply to painful areas of chest wall). 100 g 0  . diclofenac Sodium (VOLTAREN) 1 % GEL Apply 1 application topically 4 (four) times daily. (Patient not taking: Reported on  10/15/2019)    . dicyclomine (BENTYL) 10 MG capsule Take 10 mg by mouth as needed.     . donepezil (ARICEPT) 10 MG tablet Take 10 mg by mouth at bedtime.     Marland Kitchen FLUoxetine (PROZAC) 20 MG capsule Take 1 capsule (20 mg total) by mouth daily. To be combined with 40 mg 90 capsule 1  . FLUoxetine (PROZAC) 40 MG capsule Take 1 capsule (40 mg total) by mouth daily. To be combined with 20 mg 90 capsule 1  . furosemide (LASIX) 40 MG tablet Take 40 mg by mouth daily as needed for fluid.     Marland Kitchen glimepiride (AMARYL) 4 MG tablet Take 4 mg by mouth daily with breakfast. (Patient not taking: Reported on 03/02/2020)    . glucose blood (ONE TOUCH ULTRA TEST) test strip USE TO TEST BLOOD SUGAR TWO TIMES A DAY    . glucose blood test strip USE TO TEST BLOOD SUGAR TWO TIMES A DAY    . hydrOXYzine (ATARAX/VISTARIL) 50 MG tablet TAKE 1 TABLET 3 TIMES A DAY AS NEEDED FOR ANXIETY    . incobotulinumtoxinA (XEOMIN) 100 units SOLR injection Inject 100 Units into the muscle every 3 (three) months.     . Lancets (FREESTYLE) lancets Test daily before all meals/snacks    . lidocaine (LIDODERM) 5 % Place onto the skin as needed.     . lidocaine (LIDODERM) 5 % Place onto the skin.     Marland Kitchen LIDOPRO 4-4-5 % PTCH 1 patch patch every twelve hours    . LINZESS 145 MCG CAPS capsule Take 1 capsule by mouth every morning  for constipation    . metFORMIN (GLUCOPHAGE) 500 MG tablet Take 500 mg by mouth daily.    . metFORMIN (GLUCOPHAGE-XR) 500 MG 24 hr tablet Take by mouth.    . mirtazapine (REMERON) 15 MG tablet Take 1 tablet (15 mg total) by mouth at bedtime. For sleep 90 tablet 0  . naloxone (NARCAN) nasal spray 4 mg/0.1 mL One spray in either nostril once for known/suspected opioid overdose. May repeat every 2-3 minutes in alternating nostril til EMS arrives (Patient not taking: Reported on 03/02/2020)    . nitrofurantoin (MACRODANTIN) 100 MG capsule     . nystatin (MYCOSTATIN/NYSTOP) powder Apply 1 g topically daily as needed (rash).     Marland Kitchen  omeprazole (PRILOSEC) 40 MG capsule Take 40 mg by mouth daily.     . ondansetron (ZOFRAN) 4 MG tablet     . ondansetron (ZOFRAN-ODT) 4 MG disintegrating tablet Take 4 mg by mouth every 8 (eight) hours as needed.    Marland Kitchen oxyCODONE (OXY IR/ROXICODONE) 5 MG immediate release tablet Take 5 mg by mouth every 6 (six) hours as needed.     Marland Kitchen oxyCODONE (OXY IR/ROXICODONE) 5 MG immediate release tablet Take by mouth.    . oxyCODONE ER (XTAMPZA ER) 9 MG C12A Take 9 mg by mouth daily.    . solifenacin (VESICARE) 10 MG tablet Take 10 mg by mouth daily.     No current facility-administered medications for this visit.     Musculoskeletal: Strength & Muscle Tone: UTA Gait & Station: UTA Patient leans: N/A  Psychiatric Specialty Exam: Review of Systems  Musculoskeletal: Positive for neck pain.  Psychiatric/Behavioral: The patient is nervous/anxious.   All other systems reviewed and are negative.   Last menstrual period 08/20/2014.There is no height or weight on file to calculate BMI.  General Appearance: Casual  Eye Contact:  Fair  Speech:  Clear and Coherent  Volume:  Normal  Mood:  Anxious  Affect:  Congruent  Thought Process:  Goal Directed and Descriptions of Associations: Intact  Orientation:  Full (Time, Place, and Person)  Thought Content: Logical   Suicidal Thoughts:  No  Homicidal Thoughts:  No  Memory:  Immediate;   Fair Recent;   Fair Remote;   Fair Short term memory loss  Judgement:  Fair  Insight:  Fair  Psychomotor Activity:  Normal  Concentration:  Concentration: Fair and Attention Span: Fair  Recall:  [employer] of Knowledge: Fair  Language: Fair  Akathisia:  No  Handed:  Right  AIMS (if indicated): UTA  Assets:  Communication Skills Desire for Improvement Housing Intimacy Social Support  ADL's:  Intact  Cognition: WNL  Sleep:  Improving   Screenings: PHQ2-9   Flowsheet Row Video Visit from 06/30/2020 in [facility] Video Visit from  05/11/2020 in [facility]  PHQ-2 Total Score 0 4  PHQ-9 Total Score - 14    Flowsheet Row Video Visit from 06/30/2020 in [facility] No Risk       Assessment and Plan: Traci Davis is a 50 year old Caucasian female who has a history of depression, GAD, medical problems including pain was evaluated by telemedicine today.  Patient is currently making progress with regards to her mood however does have pain and will benefit from continued management of her pain.  Plan as noted below.  Plan Depression-in remission Prozac 60 mg p.o. daily Wellbutrin 100 mg p.o. daily in the morning Mirtazapine 15 mg p.o. nightly for sleep  GAD-improving Prozac as prescribed Hydroxyzine 50 to 100 mg p.o. nightly as needed  Insomnia-improving Mirtazapine 15 mg p.o. nightly  Patient will benefit from sufficient pain management, she will get in touch with her pain provider.  Follow-up in clinic in 2 months or sooner in office.  This note was generated in part or whole with voice recognition software. Voice recognition is usually quite accurate but there are transcription errors that can and very often do occur. I apologize for any typographical errors that were not detected and corrected.         Ursula Alert, MD 07/01/2020, 9:19 AM

## 2020-07-13 ENCOUNTER — Other Ambulatory Visit: Payer: Self-pay | Admitting: Physician Assistant

## 2020-07-13 DIAGNOSIS — Z1231 Encounter for screening mammogram for malignant neoplasm of breast: Secondary | ICD-10-CM

## 2020-07-26 ENCOUNTER — Telehealth: Payer: Self-pay

## 2020-07-26 DIAGNOSIS — G4701 Insomnia due to medical condition: Secondary | ICD-10-CM

## 2020-07-26 MED ORDER — DOXEPIN HCL 10 MG PO CAPS: 10.0000 mg | ORAL_CAPSULE | Freq: Every evening | ORAL | 1 refills | Status: DC | PRN

## 2020-07-26 NOTE — Telephone Encounter (Signed)
Returned call to patient.  She reports she is having sleep problems.  She stays awake till 3 AM or 6 AM and is unable to fall asleep.  She denies any recent medication changes.  She takes Aricept at bedtime.  Aricept does have a side effect of insomnia due to 14 percent cases.  This was discussed with patient.  Advised patient to start taking Aricept in the morning rather than at bedtime.  Discontinue mirtazapine.  Start doxepin.  Start doxepin 10 to 20 mg p.o. nightly as needed.  Provided medication education including side effects of drug to drug interaction, serotonin syndrome, cardiac problems dry mouth blurry vision urinary retention and so on.  She will monitor herself.

## 2020-07-26 NOTE — Telephone Encounter (Signed)
pt called states that she has not been able to sleeps i may be able to sleep for the last 3 days. she wanted to speak with you about medication changes.

## 2020-08-09 DIAGNOSIS — J329 Chronic sinusitis, unspecified: Secondary | ICD-10-CM | POA: Insufficient documentation

## 2020-08-10 ENCOUNTER — Ambulatory Visit: Payer: Medicare Other | Admitting: Obstetrics and Gynecology

## 2020-08-23 ENCOUNTER — Ambulatory Visit (INDEPENDENT_AMBULATORY_CARE_PROVIDER_SITE_OTHER): Payer: Medicare Other | Admitting: Obstetrics and Gynecology

## 2020-08-23 ENCOUNTER — Encounter: Payer: Self-pay | Admitting: Obstetrics and Gynecology

## 2020-08-23 ENCOUNTER — Other Ambulatory Visit: Payer: Self-pay

## 2020-08-23 VITALS — BP 126/84 | Ht 62.0 in | Wt 196.0 lb

## 2020-08-23 DIAGNOSIS — Z01419 Encounter for gynecological examination (general) (routine) without abnormal findings: Secondary | ICD-10-CM | POA: Diagnosis not present

## 2020-08-23 DIAGNOSIS — Z1331 Encounter for screening for depression: Secondary | ICD-10-CM

## 2020-08-23 DIAGNOSIS — N951 Menopausal and female climacteric states: Secondary | ICD-10-CM

## 2020-08-23 DIAGNOSIS — Z1339 Encounter for screening examination for other mental health and behavioral disorders: Secondary | ICD-10-CM | POA: Diagnosis not present

## 2020-08-23 NOTE — Progress Notes (Signed)
Routine Annual Gynecology Examination   PCP: Konrad Saha, MD  Chief Complaint  Patient presents with  . Annual Exam   History of Present Illness: Patient is a 50 y.o. female presents for annual exam. The patient has no complaints today.   Menopausal bleeding: none since her surgery  Menopausal symptoms: hot flashes, not sleeping well.  She will also feel cold.   Breast symptoms: denies  Last pap smear: 01/2018.  Result Normal  Last mammogram: 2.5 years ago.  Result Normal   Colorectal cancer screen: Recent colonoscopy  Past Medical History:  Diagnosis Date  . Anxiety   . Arthritis   . Asthma   . Cerebral palsy ([place])   . Complication of anesthesia    panic attacks before surgery  . COPD (chronic obstructive pulmonary disease) ([place])   . Depression   . Diabetes mellitus without complication ([facility])   . GERD (gastroesophageal reflux disease)   . Hypertension    Past Surgical History:  Procedure Laterality Date  . CESAREAN SECTION  1995  . CHOLECYSTECTOMY    . DILATATION & CURETTAGE/HYSTEROSCOPY WITH MYOSURE N/A 02/20/2018   Procedure: DILATATION & CURETTAGE/HYSTEROSCOPY WITH MYOSURE ENDOMETRIAL POLYPECTOMY;  Surgeon: Will Bonnet, MD;  Location: [facility] ORS;  Service: Gynecology;  Laterality: N/A;  . ESOPHAGOGASTRODUODENOSCOPY (EGD) WITH PROPOFOL N/A 01/01/2019   Procedure: ESOPHAGOGASTRODUODENOSCOPY (EGD) WITH PROPOFOL;  Surgeon: Lin Landsman, MD;  Location: [facility] ENDOSCOPY;  Service: Gastroenterology;  Laterality: N/A;  . FOOT CAPSULE RELEASE W/ PERCUTANEOUS HEEL CORD LENGTHENING, TIBIAL TENDON TRANSFER     age 76 ,and 2010-both legs  . JOINT REPLACEMENT Right    both knees partial replacements dates unknown  . knee replacment     x 5 per pt. last one- left knee 2014. has had 2 on R 3 on L  . TYMPANOPLASTY WITH GRAFT Right 01/08/2018   Procedure: TYMPANOPLASTY WITH POSSIBLE OSSICULAR CHAIN RECONSTRUCTION;  Surgeon: [name], MD;  Location: [facility] ORS;   Service: ENT;  Laterality: Right;   Prior to Admission medications   Medication Sig Start Date End Date Taking? Authorizing Provider  albuterol (PROVENTIL HFA;VENTOLIN HFA) 108 (90 BASE) MCG/ACT inhaler Inhale 2 puffs into the lungs 4 (four) times daily as needed. For shortness of breath and/or wheezing 09/22/14   [provider]  Celedonio Miyamoto 62.5-25 MCG/INH AEPB  05/07/19   [provider]  atorvastatin (LIPITOR) 20 MG tablet Take 20 mg by mouth daily. 04/16/19   [provider]  Blood Glucose Monitoring Suppl (GLUCOCOM BLOOD GLUCOSE MONITOR) DEVI Test daily before all meals/snacks and once before bedtime. 09/07/14   [provider]  buPROPion (WELLBUTRIN) 100 MG tablet Take 1 tablet (100 mg total) by mouth every morning. 05/11/20   Ursula Alert, MD  Chlorzoxazone 750 MG TABS Take by mouth in the morning and at bedtime. 1 1/2 tabs BID 02/05/19   [provider]  [place] suspension  12/16/18   [provider]  CREON 36000 units CPEP capsule  03/26/18   [provider]  diazepam (VALIUM) 5 MG tablet Take by mouth. 03/15/20   [provider]  diclofenac sodium (VOLTAREN) 1 % GEL Apply 1 application topically as needed (apply to painful areas of chest wall). 10/05/18   Carrie Mew, MD  dicyclomine (BENTYL) 10 MG capsule Take 10 mg by mouth as needed.  07/15/17   [provider]  donepezil (ARICEPT) 10 MG tablet Take 10 mg by mouth at bedtime.  05/21/17   [provider]  doxepin (SINEQUAN) 10 MG capsule Take 1-2 capsules (10-20 mg total) by mouth at bedtime as needed. 07/26/20   Ursula Alert, MD  FLUoxetine (PROZAC) 20 MG capsule Take 1 capsule (20 mg total) by mouth daily. To be combined with 40 mg 06/30/20   Ursula Alert, MD  FLUoxetine (PROZAC) 40 MG capsule Take 1 capsule (40 mg total) by mouth daily. To be combined with 20 mg 06/30/20   Ursula Alert, MD  furosemide (LASIX) 40 MG tablet Take 40 mg  by mouth daily as needed for fluid.     [provider]  glucose blood (ONE TOUCH ULTRA TEST) test strip USE TO TEST BLOOD SUGAR TWO TIMES A DAY 12/07/14   [provider]  glucose blood test strip USE TO TEST BLOOD SUGAR TWO TIMES A DAY 12/07/14   [provider]  hydrOXYzine (ATARAX/VISTARIL) 50 MG tablet TAKE 1 TABLET 3 TIMES A DAY AS NEEDED FOR ANXIETY 04/14/20   [provider]  incobotulinumtoxinA (XEOMIN) 100 units SOLR injection Inject 100 Units into the muscle every 3 (three) months.  03/29/15   [provider]  Lancets (FREESTYLE) lancets Test daily before all meals/snacks 09/07/14   [provider]  lidocaine (LIDODERM) 5 % Place onto the skin as needed.  08/21/18   [provider]  lidocaine (LIDODERM) 5 % Place onto the skin.  12/15/19   [provider]  LIDOPRO 4-4-5 % PTCH 1 patch patch every twelve hours 04/14/20   [provider]  LINZESS 145 MCG CAPS capsule Take 1 capsule by mouth every morning  for constipation 04/14/20   [provider]  metFORMIN (GLUCOPHAGE) 500 MG tablet Take 500 mg by mouth daily. 03/17/20   [provider]  metFORMIN (GLUCOPHAGE-XR) 500 MG 24 hr tablet Take by mouth. 02/23/20 02/22/21  [provider]  nitrofurantoin (MACRODANTIN) 100 MG capsule  03/04/18   [provider]  nystatin (MYCOSTATIN/NYSTOP) powder Apply 1 g topically daily as needed (rash).  04/10/13   [provider]  omeprazole (PRILOSEC) 40 MG capsule Take 40 mg by mouth daily.     [provider]  ondansetron (ZOFRAN) 4 MG tablet  06/10/20   [provider]  ondansetron (ZOFRAN-ODT) 4 MG disintegrating tablet Take 4 mg by mouth every 8 (eight) hours as needed. 12/04/19   [provider]  oxyCODONE (OXY IR/ROXICODONE) 5 MG immediate release tablet Take 5 mg by mouth every 6 (six) hours as needed.  12/19/18   [provider]  oxyCODONE (OXY  IR/ROXICODONE) 5 MG immediate release tablet Take by mouth. 06/13/20 09/11/20  [provider]  oxyCODONE ER (XTAMPZA ER) 9 MG C12A Take 9 mg by mouth daily.    [provider]  solifenacin (VESICARE) 10 MG tablet Take 10 mg by mouth daily.    [provider]    Allergies  Allergen Reactions  . Meloxicam Other (See Comments)    Damage kidney  . Morphine And Related Other (See Comments)    hallucinations  . Vantin [Cefpodoxime] Nausea And Vomiting  . Latex Itching  . Baclofen Other (See Comments)    "makes cerebral palsy do adverse reaction on me", tightens muscles   . Ciprofloxacin Itching and Nausea And Vomiting  . Tape Rash    skin tears.  Paper tape is ok  . Tramadol Nausea Only    Social History   Socioeconomic History  . Marital status: Single    Spouse name: Not on file  .  Number of children: Not on file  . Years of education: Not on file  . Highest education level: Not on file  Occupational History  . Not on file  Tobacco Use  . Smoking status: Never Smoker  . Smokeless tobacco: Never Used  Vaping Use  . Vaping Use: Never used  Substance and Sexual Activity  . Alcohol use: No  . Drug use: No  . Sexual activity: Yes    Partners: Male    Birth control/protection: Condom  Other Topics Concern  . Not on file  Social History Narrative  . Not on file   Social Determinants of Health   Financial Resource Strain: Not on file  Food Insecurity: Not on file  Transportation Needs: Not on file  Physical Activity: Not on file  Stress: Not on file  Social Connections: Not on file  Intimate Partner Violence: Not on file    Family History  Problem Relation Age of Onset  . CAD Father   . Heart attack Brother   . Sexual abuse Paternal Grandfather   . Breast cancer Neg Hx    Review of Systems  Constitutional: Negative.   HENT: Negative.   Eyes: Negative.   Respiratory: Negative.   Cardiovascular: Negative.   Gastrointestinal: Negative.    Genitourinary: Negative.   Musculoskeletal: Negative.   Skin: Negative.   Neurological: Negative.   Psychiatric/Behavioral: Negative.    Physical Exam Vitals: BP 126/84   Ht 5\' 2"  (1.575 m)   Wt 196 lb (88.9 kg)   LMP 08/20/2014 Comment: missed cup when trying to obtain urine spec  BMI 35.85 kg/m   Physical Exam Constitutional:      General: She is not in acute distress.    Appearance: Normal appearance. She is well-developed.  Genitourinary:     Vulva and bladder normal.     Right Labia: No rash, tenderness, lesions, skin changes or Bartholin's cyst.    Left Labia: No tenderness, lesions, skin changes, Bartholin's cyst or rash.    No inguinal adenopathy present in the right or left side.    Pelvic Tanner Score: 5/5.    No vaginal discharge, erythema, tenderness or bleeding.      Right Adnexa: not tender, not full and no mass present.    Left Adnexa: not tender, not full and no mass present.    No cervical motion tenderness, discharge, lesion or polyp.     Uterus is not enlarged or tender.     No uterine mass detected.    Pelvic exam was performed with patient in the lithotomy position.  Breasts:     Right: No inverted nipple, mass, nipple discharge, skin change or tenderness.     Left: No inverted nipple, mass, nipple discharge, skin change or tenderness.    HENT:     Head: Normocephalic and atraumatic.  Eyes:     General: No scleral icterus.    Conjunctiva/sclera: Conjunctivae normal.  Neck:     Thyroid: No thyromegaly.  Cardiovascular:     Rate and Rhythm: Normal rate and regular rhythm.     Heart sounds: No murmur heard. No friction rub. No gallop.   Pulmonary:     Effort: Pulmonary effort is normal. No respiratory distress.     Breath sounds: Normal breath sounds. No wheezing or rales.  Abdominal:     General: Bowel sounds are normal. There is no distension.     Palpations: Abdomen is soft. There is no mass.     Tenderness: There  is no abdominal  tenderness. There is no guarding or rebound.     Hernia: There is no hernia in the left inguinal area or right inguinal area.  Musculoskeletal:        General: No swelling or tenderness. Normal range of motion.     Cervical back: Normal range of motion and neck supple.  Lymphadenopathy:     Cervical: No cervical adenopathy.     Lower Body: No right inguinal adenopathy. No left inguinal adenopathy.  Neurological:     General: No focal deficit present.     Mental Status: She is alert and oriented to person, place, and time.     Cranial Nerves: No cranial nerve deficit.  Skin:    General: Skin is warm and dry.     Findings: No erythema or rash.  Psychiatric:        Mood and Affect: Mood normal.        Behavior: Behavior normal.        Judgment: Judgment normal.      Female chaperone present for pelvic and breast  portions of the physical exam  Results: AUDIT Questionnaire (screen for alcoholism): 3 PHQ-9: 1   Assessment and Plan:  50 y.o. female here for routine annual gynecologic examination  Plan: Problem List Items Addressed This Visit   None   Visit Diagnoses    Women's annual routine gynecological examination    -  Primary   Screening for depression       Screening for alcoholism       Climacteric         Screening: -- Blood pressure screen normal -- Colonoscopy - not due -- Mammogram - due. Patient to call [facility] to arrange. She understands that it is her responsibility to arrange this. -- Weight screening: obese: discussed management options, including lifestyle, dietary, and exercise. -- Depression screening negative (PHQ-9) -- Nutrition: normal -- cholesterol screening: per PCP -- osteoporosis screening: not due -- tobacco screening: not using -- alcohol screening: AUDIT questionnaire indicates low-risk usage. -- family history of breast cancer screening: done. not at high risk. -- no evidence of domestic violence or intimate partner violence. -- STD  screening: gonorrhea/chlamydia NAAT not collected per patient request. -- pap smear not collected per ASCCP guidelines  Climacteric: discussed treatment options. After much discussion, she would like to continue with her current symptoms to see if they improve. Overall, I think she'd be reasonable as a candidate. I'd prefer transcutaneous combination HRT at the lowest dose possible. She is quite sedentary and I do have some concerns I relayed to her about VTE risk.   Prentice Docker, MD 08/23/2020 3:46 PM

## 2020-08-25 ENCOUNTER — Encounter: Payer: Self-pay | Admitting: Obstetrics and Gynecology

## 2020-08-30 ENCOUNTER — Ambulatory Visit (INDEPENDENT_AMBULATORY_CARE_PROVIDER_SITE_OTHER): Payer: Medicare Other | Admitting: Psychiatry

## 2020-08-30 ENCOUNTER — Other Ambulatory Visit: Payer: Self-pay

## 2020-08-30 ENCOUNTER — Encounter: Payer: Self-pay | Admitting: Psychiatry

## 2020-08-30 VITALS — BP 149/87 | HR 85 | Temp 97.5°F | Wt 201.0 lb

## 2020-08-30 DIAGNOSIS — F3342 Major depressive disorder, recurrent, in full remission: Secondary | ICD-10-CM

## 2020-08-30 DIAGNOSIS — F411 Generalized anxiety disorder: Secondary | ICD-10-CM

## 2020-08-30 DIAGNOSIS — G4701 Insomnia due to medical condition: Secondary | ICD-10-CM | POA: Diagnosis not present

## 2020-08-30 MED ORDER — AMITRIPTYLINE HCL 10 MG PO TABS: 10.0000 mg | ORAL_TABLET | Freq: Every evening | ORAL | 1 refills | Status: DC | PRN

## 2020-08-30 NOTE — Progress Notes (Signed)
[facility] MD OP Progress Note  08/30/2020 3:57 PM Traci Davis  MRN:  [id number]  Chief Complaint:  Chief Complaint    Follow-up     HPI: Traci Davis is a 50 year old Caucasian female, lives in [place], [place], has a history of MDD, GAD, insomnia, cerebral palsy, diabetes melitis was evaluated in office today.  Patient today reports she is currently struggling with mood swings, crying spells, sleep problems racing thoughts.  She reports she is constantly worrying about herself since she is in a lot of pain.  The pain also affects her mood causing her to have more mood swings.  She struggles with chronic pain of her neck and her back and has a history of cerebral palsy.  She is currently on pain medications and recently got lidocaine injections to her neck.  She may have to go back for injection to the lower back.  In spite of that she continues to be in pain.  This does have an impact on her sleep.  Patient reports the doxepin 10 mg did not help however when she went up to 20 mg it made her groggy.  She reports there are nights when she cannot sleep when she does not have a lot of racing thoughts.  Hence most nights she lays there and worry about everything.  Patient denies any suicidality, homicidality or perceptual disturbances.  She had an appointment with her OB/GYN and was recently started on medications for perimenopausal symptoms.  She reports she will have to go back for follow-up if it does not get better.  Patient denies any other concerns today.  Visit Diagnosis:    ICD-10-CM   1. MDD (major depressive disorder), recurrent, in full remission ([place])  F33.42   2. GAD (generalized anxiety disorder)  F41.1   3. Insomnia due to medical condition  G47.01 amitriptyline (ELAVIL) 10 MG tablet   pain, anxiety    Past Psychiatric History: I have reviewed past psychiatric history from progress note on 09/12/2017.  Past trials of Xanax, Klonopin, Cymbalta, Rozerem, Ambien, trazodone  Past  Medical History:  Past Medical History:  Diagnosis Date  . Anxiety   . Arthritis   . Asthma   . Cerebral palsy ([place])   . Complication of anesthesia    panic attacks before surgery  . COPD (chronic obstructive pulmonary disease) ([place])   . Depression   . Diabetes mellitus without complication ([place])   . GERD (gastroesophageal reflux disease)   . Hypertension     Past Surgical History:  Procedure Laterality Date  . CESAREAN SECTION  1995  . CHOLECYSTECTOMY    . DILATATION & CURETTAGE/HYSTEROSCOPY WITH MYOSURE N/A 02/20/2018   Procedure: DILATATION & CURETTAGE/HYSTEROSCOPY WITH MYOSURE ENDOMETRIAL POLYPECTOMY;  Surgeon: Will Bonnet, MD;  Location: [facility] ORS;  Service: Gynecology;  Laterality: N/A;  . ESOPHAGOGASTRODUODENOSCOPY (EGD) WITH PROPOFOL N/A 01/01/2019   Procedure: ESOPHAGOGASTRODUODENOSCOPY (EGD) WITH PROPOFOL;  Surgeon: Lin Landsman, MD;  Location: [facility] ENDOSCOPY;  Service: Gastroenterology;  Laterality: N/A;  . FOOT CAPSULE RELEASE W/ PERCUTANEOUS HEEL CORD LENGTHENING, TIBIAL TENDON TRANSFER     age 50 ,and 2010-both legs  . JOINT REPLACEMENT Right    both knees partial replacements dates unknown  . knee replacment     x 5 per pt. last one- left knee 2014. has had 2 on R 3 on L  . TYMPANOPLASTY WITH GRAFT Right 01/08/2018   Procedure: TYMPANOPLASTY WITH POSSIBLE OSSICULAR CHAIN RECONSTRUCTION;  Surgeon: [name], MD;  Location: [facility]  ORS;  Service: ENT;  Laterality: Right;    Family Psychiatric History: I have reviewed family psychiatric history from my progress note on 09/12/2017  Family History:  Family History  Problem Relation Age of Onset  . CAD Father   . Heart attack Brother   . Sexual abuse Paternal Grandfather   . Breast cancer Neg Hx     Social History: Reviewed social history from progress note on 09/12/2017 Social History   Socioeconomic History  . Marital status: Single    Spouse name: Not on file  . Number of children: 1  . Years of  education: Not on file  . Highest education level: Associate degree: occupational, Hotel manager, or vocational program  Occupational History  . Not on file  Tobacco Use  . Smoking status: Never Smoker  . Smokeless tobacco: Never Used  Vaping Use  . Vaping Use: Never used  Substance and Sexual Activity  . Alcohol use: No  . Drug use: No  . Sexual activity: Yes    Partners: Male    Birth control/protection: Condom  Other Topics Concern  . Not on file  Social History Narrative  . Not on file   Social Determinants of Health   Financial Resource Strain: Not on file  Food Insecurity: Not on file  Transportation Needs: Not on file  Physical Activity: Not on file  Stress: Not on file  Social Connections: Not on file    Allergies:  Allergies  Allergen Reactions  . Meloxicam Other (See Comments)    Damage kidney  . Morphine And Related Other (See Comments)    hallucinations  . Vantin [Cefpodoxime] Nausea And Vomiting  . Latex Itching  . Baclofen Other (See Comments)    "makes cerebral palsy do adverse reaction on me", tightens muscles   . Ciprofloxacin Itching and Nausea And Vomiting  . Tape Rash    skin tears.  Paper tape is ok  . Tramadol Nausea Only    Metabolic Disorder Labs: Lab Results  Component Value Date   HGBA1C 6.1 (H) 02/19/2015   No results found for: PROLACTIN No results found for: CHOL, TRIG, HDL, CHOLHDL, VLDL, LDLCALC Lab Results  Component Value Date   TSH 2.198 07/10/2016   TSH 2.891 02/19/2015    Therapeutic Level Labs: No results found for: LITHIUM No results found for: VALPROATE No components found for:  CBMZ  Current Medications: Current Outpatient Medications  Medication Sig Dispense Refill  . acetaminophen (TYLENOL) 325 MG tablet Take 2 tablets (650 mg total) by mouth every 6 (six) hours as needed. 60 tablet 0  . albuterol (PROVENTIL HFA;VENTOLIN HFA) 108 (90 BASE) MCG/ACT inhaler Inhale 2 puffs into the lungs 4 (four) times daily as  needed. For shortness of breath and/or wheezing    . amitriptyline (ELAVIL) 10 MG tablet Take 1-2 tablets (10-20 mg total) by mouth at bedtime as needed for sleep. 60 tablet 1  . ANORO ELLIPTA 62.5-25 MCG/INH AEPB     . Blood Glucose Monitoring Suppl (GLUCOCOM BLOOD GLUCOSE MONITOR) DEVI Test daily before all meals/snacks and once before bedtime.    Marland Kitchen buPROPion (WELLBUTRIN) 100 MG tablet Take 1 tablet (100 mg total) by mouth every morning. 90 tablet 1  . chlorzoxazone (PARAFON) 500 MG tablet Take 500 mg by mouth 3 (three) times daily.    Marland Kitchen CREON 36000 units CPEP capsule     . dicyclomine (BENTYL) 10 MG capsule Take 10 mg by mouth as needed.     . donepezil (ARICEPT)  10 MG tablet Take by mouth.    Marland Kitchen FLUoxetine (PROZAC) 20 MG capsule Take 1 capsule (20 mg total) by mouth daily. To be combined with 40 mg 90 capsule 1  . FLUoxetine (PROZAC) 40 MG capsule Take 1 capsule (40 mg total) by mouth daily. To be combined with 20 mg 90 capsule 1  . fluticasone (FLONASE) 50 MCG/ACT nasal spray 1 spray into each nostril two (2) times a day.    . furosemide (LASIX) 40 MG tablet Take 40 mg by mouth daily as needed for fluid.     Marland Kitchen glimepiride (AMARYL) 4 MG tablet Take 4 mg by mouth daily with breakfast.    . glucose blood test strip USE TO TEST BLOOD SUGAR TWO TIMES A DAY    . glucose blood test strip USE TO TEST BLOOD SUGAR TWO TIMES A DAY    . incobotulinumtoxinA (XEOMIN) 100 units SOLR injection Inject 100 Units into the muscle every 3 (three) months.     . Lancets (FREESTYLE) lancets Test daily before all meals/snacks    . lidocaine (LIDODERM) 5 % Place onto the skin as needed.     Marland Kitchen LIDOPRO 4-4-5 % PTCH 1 patch patch every twelve hours    . LINZESS 145 MCG CAPS capsule Take 1 capsule by mouth every morning  for constipation    . metFORMIN (GLUCOPHAGE-XR) 500 MG 24 hr tablet Take by mouth.    . naloxone (NARCAN) nasal spray 4 mg/0.1 mL One spray in either nostril once for known/suspected opioid overdose.  May repeat every 2-3 minutes in alternating nostril til EMS arrives    . nitrofurantoin (MACRODANTIN) 100 MG capsule     . nystatin (MYCOSTATIN/NYSTOP) powder Apply 1 g topically daily as needed (rash).     Marland Kitchen omeprazole (PRILOSEC) 40 MG capsule Take 40 mg by mouth daily.     . ondansetron (ZOFRAN) 4 MG tablet     . oxyCODONE (OXY IR/ROXICODONE) 5 MG immediate release tablet Take by mouth.    . oxyCODONE ER (XTAMPZA ER) 9 MG C12A Take 9 mg by mouth daily.    . solifenacin (VESICARE) 10 MG tablet Take 10 mg by mouth daily.     No current facility-administered medications for this visit.     Musculoskeletal: Strength & Muscle Tone: UTA Gait & Station: Uses walker Patient leans: Front  Psychiatric Specialty Exam: Review of Systems  Musculoskeletal: Positive for back pain and gait problem.  Psychiatric/Behavioral: Positive for sleep disturbance. The patient is nervous/anxious.        Mood swings   All other systems reviewed and are negative.   Blood pressure (!) 149/87, pulse 85, temperature (!) 97.5 F (36.4 C), temperature source Temporal, weight 201 lb (91.2 kg), last menstrual period 08/20/2014.Body mass index is 36.76 kg/m.  General Appearance: Casual  Eye Contact:  Fair  Speech:  Clear and Coherent  Volume:  Normal  Mood:  Anxious and Dysphoric  Affect:  Tearful  Thought Process:  Goal Directed and Descriptions of Associations: Intact  Orientation:  Full (Time, Place, and Person)  Thought Content: Rumination   Suicidal Thoughts:  No  Homicidal Thoughts:  No  Memory:  Immediate;   Fair Recent;   Fair Remote;   Fair  Judgement:  Fair  Insight:  Fair  Psychomotor Activity:  Normal  Concentration:  Concentration: Fair and Attention Span: Fair  Recall:  [employer] of Knowledge: Fair  Language: Fair  Akathisia:  No  Handed:  Right  AIMS (  if indicated): done  Assets:  Communication Skills Desire for Improvement Housing Intimacy Social Support  ADL's:  Intact   Cognition: WNL  Sleep:  Poor   Screenings: PHQ2-9   [place] Office Visit from 08/30/2020 in [facility] Video Visit from 06/30/2020 in [place] Video Visit from 05/11/2020 in [place]  PHQ-2 Total Score 0 0 4  PHQ-9 Total Score -- -- [place] Office Visit from 08/30/2020 in [place] Video Visit from 06/30/2020 in [place] Error: Question 6 not populated No Risk       Assessment and Plan: Traci Davis is a 50 year old Caucasian female who has a history of depression, GAD, chronic pain was evaluated in office today.  Patient is currently struggling with mood swings, sleep problems, racing thoughts likely multifactorial due to pain, her psychiatric problems of depression, anxiety as well as perimenopausal symptoms.  She will continue to benefit from the following plan.  Plan  MDD- remission Prozac 60 mg p.o. daily Wellbutrin 100 mg p.o. daily in the morning  GAD-unstable We will refer for CBT. Patient to be provided with resources to find a therapist in the community.  Provided education. Continue Prozac as prescribed Hydroxyzine 50-100 mg p.o. nightly as needed  Insomnia-unstable Discontinue doxepin. Start Elavil 10-20 mg p.o. nightly as needed Discussed drug to drug interaction including serotonin syndrome.  Provided education.  She will continue to benefit from sufficient pain management.  She will also follow up with OB/GYN for perimenopausal symptoms.  She will definitely need psychotherapy sessions.  Patient to establish care with therapist as soon as possible.  Patient to follow-up with her primary care provider for elevated blood pressure reading.  Follow-up in clinic in 2 to 3 weeks or sooner if needed.  This note was generated in part or whole with voice recognition software. Voice  recognition is usually quite accurate but there are transcription errors that can and very often do occur. I apologize for any typographical errors that were not detected and corrected.       Ursula Alert, MD 08/31/2020, 12:43 PM

## 2020-08-30 NOTE — Patient Instructions (Addendum)
Serotonin Syndrome Serotonin is a chemical in your body (neurotransmitter) that helps to control several functions, such as:  Brain and nerve cell function.  Mood and emotions.  Memory.  Eating.  Sleeping.  Sexual activity.  Stress response. Having too much serotonin in your body can cause serotonin syndrome. This condition can be harmful to your brain and nerve cells. This can be a life-threatening condition. What are the causes? This condition may be caused by taking medicines or drugs that increase the level of serotonin in your body, such as:  Antidepressant medicines.  Migraine medicines.  Certain pain medicines.  Certain drugs, including ecstasy, LSD, cocaine, and amphetamines.  Over-the-counter cough or cold medicines that contain dextromethorphan.  Certain herbal supplements, including St. John's wort, ginseng, and nutmeg. This condition usually occurs when you take these medicines or drugs in combination, but it can also happen with a high dose of a single medicine or drug. What increases the risk? You are more likely to develop this condition if:  You just started taking a medicine or drug that increases the level of serotonin in the body.  You recently increased the dose of a medicine or drug that increases the level of serotonin in the body.  You take more than one medicine or drug that increases the level of serotonin in the body. What are the signs or symptoms? Symptoms of this condition usually start within several hours of taking a medicine or drug. Symptoms may be mild or severe. Mild symptoms include:  Sweating.  Restlessness or agitation.  Muscle twitching or stiffness.  Rapid heart rate.  Nausea and vomiting.  Diarrhea.  Headache.  Shivering or goose bumps.  Confusion. Severe symptoms include:  Irregular heartbeat.  Seizures.  Loss of consciousness.  High fever. How is this diagnosed? This condition may be diagnosed based  on:  Your medical history.  A physical exam.  Your prior use of drugs and medicines.  Blood or urine tests. These may be used to rule out other causes of your symptoms. How is this treated? The treatment for this condition depends on the severity of your symptoms.  For mild cases, stopping the medicine or drug that caused your condition is usually all that is needed.  For moderate to severe cases, treatment in a hospital may be needed to prevent or manage life-threatening symptoms. This may include medicines to control your symptoms, IV fluids, interventions to support your breathing, and treatments to control your body temperature. Follow these instructions at home: Medicines  Take over-the-counter and prescription medicines only as told by your health care provider. This is important.  Check with your health care provider before you start taking any new prescriptions, over-the-counter medicines, herbs, or supplements.  Avoid combining any medicines that can cause this condition to occur.   Lifestyle  Maintain a healthy lifestyle. ? Eat a healthy diet that includes plenty of vegetables, fruits, whole grains, low-fat dairy products, and lean protein. Do not eat a lot of foods that are high in fat, added sugars, or salt. ? Get the right amount and quality of sleep. Most adults need 7-9 hours of sleep each night. ? Make time to exercise, even if it is only for short periods of time. Most adults should exercise for at least 150 minutes each week. ? Do not drink alcohol. ? Do not use illegal drugs, and do not take medicines for reasons other than they are prescribed.   General instructions  Do not use any products that   contain nicotine or tobacco, such as cigarettes and e-cigarettes. If you need help quitting, ask your health care provider.  Keep all follow-up visits as told by your health care provider. This is important. Contact a health care provider if:  Your symptoms do not  improve or they get worse. Get help right away if you:  Have worsening confusion, severe headache, chest pain, high fever, seizures, or loss of consciousness.  Experience serious side effects of medicine, such as swelling of your face, lips, tongue, or throat.  Have serious thoughts about hurting yourself or others. These symptoms may represent a serious problem that is an emergency. Do not wait to see if the symptoms will go away. Get medical help right away. Call your local emergency services (911 in the [place] Do not drive yourself to the hospital. If you ever feel like you may hurt yourself or others, or have thoughts about taking your own life, get help right away. You can go to your nearest emergency department or call:  Your local emergency services (911 in the [place]  A suicide crisis helpline, such as the [place] at 872-470-4392. This is open 24 hours a day. Summary  Serotonin is a brain chemical that helps to regulate the nervous system. High levels of serotonin in the body can cause serotonin syndrome, which is a very dangerous condition.  This condition may be caused by taking medicines or drugs that increase the level of serotonin in your body.  Treatment depends on the severity of your symptoms. For mild cases, stopping the medicine or drug that caused your condition is usually all that is needed.  Check with your health care provider before you start taking any new prescriptions, over-the-counter medicines, herbs, or supplements. This information is not intended to replace advice given to you by your health care provider. Make sure you discuss any questions you have with your health care provider. Document Revised: 05/24/2017 Document Reviewed: 05/24/2017 Elsevier Patient Education  2021 [place]. Amitriptyline tablets What is this medicine? AMITRIPTYLINE (a mee TRIP ti leen) is used to treat depression. This medicine may be used for  other purposes; ask your health care provider or pharmacist if you have questions. COMMON BRAND NAME(S): Elavil, Vanatrip What should I tell my health care provider before I take this medicine? They need to know if you have any of these conditions:  an alcohol problem  asthma, difficulty breathing  bipolar disorder or schizophrenia  difficulty passing urine, prostate trouble  glaucoma  heart disease or previous heart attack  liver disease  over active thyroid  seizures  thoughts or plans of suicide, a previous suicide attempt, or family history of suicide attempt  an unusual or allergic reaction to amitriptyline, other medicines, foods, dyes, or preservatives  pregnant or trying to get pregnant  breast-feeding How should I use this medicine? Take this medicine by mouth with a drink of water. Follow the directions on the prescription label. You can take the tablets with or without food. Take your medicine at regular intervals. Do not take it more often than directed. Do not stop taking this medicine suddenly except upon the advice of your doctor. Stopping this medicine too quickly may cause serious side effects or your condition may worsen. A special MedGuide will be given to you by the pharmacist with each prescription and refill. Be sure to read this information carefully each time. Talk to your pediatrician regarding the use of this medicine in children. Special care may  be needed. Overdosage: If you think you have taken too much of this medicine contact a poison control center or emergency room at once. NOTE: This medicine is only for you. Do not share this medicine with others. What if I miss a dose? If you miss a dose, take it as soon as you can. If it is almost time for your next dose, take only that dose. Do not take double or extra doses. What may interact with this medicine? Do not take this medicine with any of the following medications:  arsenic trioxide  certain  medicines used to regulate abnormal heartbeat or to treat other heart conditions  cisapride  droperidol  halofantrine  linezolid  MAOIs like Carbex, Eldepryl, Marplan, Nardil, and Parnate  methylene blue  other medicines for mental depression  phenothiazines like perphenazine, thioridazine and chlorpromazine  pimozide  probucol  procarbazine  sparfloxacin  St. John's Wort This medicine may also interact with the following medications:  atropine and related drugs like hyoscyamine, scopolamine, tolterodine and others  barbiturate medicines for inducing sleep or treating seizures, like phenobarbital  cimetidine  disulfiram  ethchlorvynol  thyroid hormones such as levothyroxine  ziprasidone This list may not describe all possible interactions. Give your health care provider a list of all the medicines, herbs, non-prescription drugs, or dietary supplements you use. Also tell them if you smoke, drink alcohol, or use illegal drugs. Some items may interact with your medicine. What should I watch for while using this medicine? Tell your doctor if your symptoms do not get better or if they get worse. Visit your doctor or health care professional for regular checks on your progress. Because it may take several weeks to see the full effects of this medicine, it is important to continue your treatment as prescribed by your doctor. Patients and their families should watch out for new or worsening thoughts of suicide or depression. Also watch out for sudden changes in feelings such as feeling anxious, agitated, panicky, irritable, hostile, aggressive, impulsive, severely restless, overly excited and hyperactive, or not being able to sleep. If this happens, especially at the beginning of treatment or after a change in dose, call your health care professional. Dennis Bast may get drowsy or dizzy. Do not drive, use machinery, or do anything that needs mental alertness until you know how this  medicine affects you. Do not stand or sit up quickly, especially if you are an older patient. This reduces the risk of dizzy or fainting spells. Alcohol may interfere with the effect of this medicine. Avoid alcoholic drinks. Do not treat yourself for coughs, colds, or allergies without asking your doctor or health care professional for advice. Some ingredients can increase possible side effects. Your mouth may get dry. Chewing sugarless gum or sucking hard candy, and drinking plenty of water will help. Contact your doctor if the problem does not go away or is severe. This medicine may cause dry eyes and blurred vision. If you wear contact lenses you may feel some discomfort. Lubricating drops may help. See your eye doctor if the problem does not go away or is severe. This medicine can cause constipation. Try to have a bowel movement at least every 2 to 3 days. If you do not have a bowel movement for 3 days, call your doctor or health care professional. This medicine can make you more sensitive to the sun. Keep out of the sun. If you cannot avoid being in the sun, wear protective clothing and use sunscreen. Do not  use sun lamps or tanning beds/booths. What side effects may I notice from receiving this medicine? Side effects that you should report to your doctor or health care professional as soon as possible:  allergic reactions like skin rash, itching or hives, swelling of the face, lips, or tongue  anxious  breathing problems  changes in vision  confusion  elevated mood, decreased need for sleep, racing thoughts, impulsive behavior  eye pain  fast, irregular heartbeat  feeling faint or lightheaded, falls  feeling agitated, angry, or irritable  fever with increased sweating  hallucination, loss of contact with reality  seizures  stiff muscles  suicidal thoughts or other mood changes  tingling, pain, or numbness in the feet or hands  trouble passing urine or change in the  amount of urine  trouble sleeping  unusually weak or tired  vomiting  yellowing of the eyes or skin Side effects that usually do not require medical attention (report to your doctor or health care professional if they continue or are bothersome):  change in sex drive or performance  change in appetite or weight  constipation  dizziness  dry mouth  nausea  tired  tremors  upset stomach This list may not describe all possible side effects. Call your doctor for medical advice about side effects. You may report side effects to FDA at 1-800-FDA-1088. Where should I keep my medicine? Keep out of the reach of children. Store at room temperature between 20 and 25 degrees C (68 and 77 degrees F). Throw away any unused medicine after the expiration date. NOTE: This sheet is a summary. It may not cover all possible information. If you have questions about this medicine, talk to your doctor, pharmacist, or health care provider.  2021 [employer]vier/[employer] Standard (2018-04-08 13:04:32)

## 2020-09-19 ENCOUNTER — Other Ambulatory Visit: Payer: Self-pay

## 2020-09-19 ENCOUNTER — Telehealth (INDEPENDENT_AMBULATORY_CARE_PROVIDER_SITE_OTHER): Payer: Medicare Other | Admitting: Psychiatry

## 2020-09-19 ENCOUNTER — Encounter: Payer: Self-pay | Admitting: Psychiatry

## 2020-09-19 DIAGNOSIS — F3342 Major depressive disorder, recurrent, in full remission: Secondary | ICD-10-CM | POA: Diagnosis not present

## 2020-09-19 DIAGNOSIS — G4701 Insomnia due to medical condition: Secondary | ICD-10-CM

## 2020-09-19 DIAGNOSIS — F411 Generalized anxiety disorder: Secondary | ICD-10-CM

## 2020-09-19 NOTE — Progress Notes (Signed)
Virtual Visit via Video Note  I connected with [name] on 09/19/20 at  4:30 PM EDT by a video enabled telemedicine application and verified that I am speaking with the correct person using two identifiers.  Location Provider Location : [facility] Patient Location : Home  Participants: Patient ,Daughter, Provider    I discussed the limitations of evaluation and management by telemedicine and the availability of in person appointments. The patient expressed understanding and agreed to proceed.    I discussed the assessment and treatment plan with the patient. The patient was provided an opportunity to ask questions and all were answered. The patient agreed with the plan and demonstrated an understanding of the instructions.   The patient was advised to call back or seek an in-person evaluation if the symptoms worsen or if the condition fails to improve as anticipated.   [facility] MD OP Progress Note  09/19/2020 5:06 PM Traci Davis  MRN:  [id number]  Chief Complaint:  Chief Complaint    Follow-up; Anxiety     HPI: Traci Davis is a 50 year old Caucasian female, lives in [place], on [place], has a history of MDD, GAD, insomnia, cerebral palsy, diabetes mellitus was evaluated by telemedicine today.  Patient today reports she is currently struggling with nausea since the past few weeks.  She is also having vomiting.  She reports she had breakfast this morning and felt extremely nauseous after that.  She reports she was recently started on Myrbetriq.  She was also started on Elavil by writer on May 3 when she came into the office for her sleep.  Patient reports she stopped the Elavil however in spite of stopping it for the past 3 days the nausea does not seem to get better.  Patient has not reached out to her primary care provider yet.  She is also in pain, had recent increase in dosage of her pain medication Xtampza ER.  That does not seem to help much.   Patient reports her current  health problems does make her anxious.  She denies any suicidality, homicidality or perceptual disturbances.  Patient denies any other concerns today.  Visit Diagnosis:    ICD-10-CM   1. MDD (major depressive disorder), recurrent, in full remission ([place])  F33.42   2. GAD (generalized anxiety disorder)  F41.1   3. Insomnia due to medical condition  G47.01    pain, anxiety    Past Psychiatric History: I have reviewed past psychiatric history from progress note on 09/12/2017.  Past trials of Xanax, Klonopin, Cymbalta, Rozerem, Ambien, trazodone  Past Medical History:  Past Medical History:  Diagnosis Date  . Anxiety   . Arthritis   . Asthma   . Cerebral palsy ([place])   . Complication of anesthesia    panic attacks before surgery  . COPD (chronic obstructive pulmonary disease) ([place])   . Depression   . Diabetes mellitus without complication ([place])   . GERD (gastroesophageal reflux disease)   . Hypertension     Past Surgical History:  Procedure Laterality Date  . CESAREAN SECTION  1995  . CHOLECYSTECTOMY    . DILATATION & CURETTAGE/HYSTEROSCOPY WITH MYOSURE N/A 02/20/2018   Procedure: DILATATION & CURETTAGE/HYSTEROSCOPY WITH MYOSURE ENDOMETRIAL POLYPECTOMY;  Surgeon: Will Bonnet, MD;  Location: [facility] ORS;  Service: Gynecology;  Laterality: N/A;  . ESOPHAGOGASTRODUODENOSCOPY (EGD) WITH PROPOFOL N/A 01/01/2019   Procedure: ESOPHAGOGASTRODUODENOSCOPY (EGD) WITH PROPOFOL;  Surgeon: Lin Landsman, MD;  Location: [facility] ENDOSCOPY;  Service: Gastroenterology;  Laterality: N/A;  .  FOOT CAPSULE RELEASE W/ PERCUTANEOUS HEEL CORD LENGTHENING, TIBIAL TENDON TRANSFER     age 79 ,and 2010-both legs  . JOINT REPLACEMENT Right    both knees partial replacements dates unknown  . knee replacment     x 5 per pt. last one- left knee 2014. has had 2 on R 3 on L  . TYMPANOPLASTY WITH GRAFT Right 01/08/2018   Procedure: TYMPANOPLASTY WITH POSSIBLE OSSICULAR CHAIN RECONSTRUCTION;  Surgeon: [name], MD;  Location: [facility] ORS;  Service: ENT;  Laterality: Right;    Family Psychiatric History: I have reviewed family psychiatric history from progress note on 09/12/2017  Family History:  Family History  Problem Relation Age of Onset  . CAD Father   . Heart attack Brother   . Sexual abuse Paternal Grandfather   . Breast cancer Neg Hx     Social History: I have reviewed social history from progress note on 09/12/2017 Social History   Socioeconomic History  . Marital status: Single    Spouse name: Not on file  . Number of children: 1  . Years of education: Not on file  . Highest education level: Associate degree: occupational, Hotel manager, or vocational program  Occupational History  . Not on file  Tobacco Use  . Smoking status: Never Smoker  . Smokeless tobacco: Never Used  Vaping Use  . Vaping Use: Never used  Substance and Sexual Activity  . Alcohol use: No  . Drug use: No  . Sexual activity: Yes    Partners: Male    Birth control/protection: Condom  Other Topics Concern  . Not on file  Social History Narrative  . Not on file   Social Determinants of Health   Financial Resource Strain: Not on file  Food Insecurity: Not on file  Transportation Needs: Not on file  Physical Activity: Not on file  Stress: Not on file  Social Connections: Not on file    Allergies:  Allergies  Allergen Reactions  . Meloxicam Other (See Comments)    Damage kidney  . Morphine And Related Other (See Comments)    hallucinations  . Vantin [Cefpodoxime] Nausea And Vomiting  . Latex Itching  . Baclofen Other (See Comments)    "makes cerebral palsy do adverse reaction on me", tightens muscles   . Ciprofloxacin Itching and Nausea And Vomiting  . Tape Rash    skin tears.  Paper tape is ok  . Tramadol Nausea Only    Metabolic Disorder Labs: Lab Results  Component Value Date   HGBA1C 6.1 (H) 02/19/2015   No results found for: PROLACTIN No results found for: CHOL, TRIG, HDL,  CHOLHDL, VLDL, LDLCALC Lab Results  Component Value Date   TSH 2.198 07/10/2016   TSH 2.891 02/19/2015    Therapeutic Level Labs: No results found for: LITHIUM No results found for: VALPROATE No components found for:  CBMZ  Current Medications: Current Outpatient Medications  Medication Sig Dispense Refill  . mirabegron ER (MYRBETRIQ) 25 MG TB24 tablet Take 1 tablet by mouth daily.    Marland Kitchen acetaminophen (TYLENOL) 325 MG tablet Take 2 tablets (650 mg total) by mouth every 6 (six) hours as needed. 60 tablet 0  . albuterol (PROVENTIL HFA;VENTOLIN HFA) 108 (90 BASE) MCG/ACT inhaler Inhale 2 puffs into the lungs 4 (four) times daily as needed. For shortness of breath and/or wheezing    . ANORO ELLIPTA 62.5-25 MCG/INH AEPB     . Blood Glucose Monitoring Suppl (GLUCOCOM BLOOD GLUCOSE MONITOR) DEVI Test daily before  all meals/snacks and once before bedtime.    Marland Kitchen buPROPion (WELLBUTRIN) 100 MG tablet Take 1 tablet (100 mg total) by mouth every morning. 90 tablet 1  . chlorzoxazone (PARAFON) 500 MG tablet Take 500 mg by mouth 3 (three) times daily.    Marland Kitchen CREON 36000 units CPEP capsule     . dicyclomine (BENTYL) 10 MG capsule Take 10 mg by mouth as needed.     . donepezil (ARICEPT) 10 MG tablet Take by mouth.    Marland Kitchen FLUoxetine (PROZAC) 20 MG capsule Take 1 capsule (20 mg total) by mouth daily. To be combined with 40 mg 90 capsule 1  . FLUoxetine (PROZAC) 40 MG capsule Take 1 capsule (40 mg total) by mouth daily. To be combined with 20 mg 90 capsule 1  . fluticasone (FLONASE) 50 MCG/ACT nasal spray 1 spray into each nostril two (2) times a day.    . furosemide (LASIX) 40 MG tablet Take 40 mg by mouth daily as needed for fluid.     Marland Kitchen glimepiride (AMARYL) 4 MG tablet Take 4 mg by mouth daily with breakfast.    . glucose blood test strip USE TO TEST BLOOD SUGAR TWO TIMES A DAY    . glucose blood test strip USE TO TEST BLOOD SUGAR TWO TIMES A DAY    . incobotulinumtoxinA (XEOMIN) 100 units SOLR injection  Inject 100 Units into the muscle every 3 (three) months.     . Lancets (FREESTYLE) lancets Test daily before all meals/snacks    . lidocaine (LIDODERM) 5 % Place onto the skin as needed.     Marland Kitchen LIDOPRO 4-4-5 % PTCH 1 patch patch every twelve hours    . LINZESS 145 MCG CAPS capsule Take 1 capsule by mouth every morning  for constipation    . metFORMIN (GLUCOPHAGE-XR) 500 MG 24 hr tablet Take by mouth.    . naloxone (NARCAN) nasal spray 4 mg/0.1 mL One spray in either nostril once for known/suspected opioid overdose. May repeat every 2-3 minutes in alternating nostril til EMS arrives    . nitrofurantoin (MACRODANTIN) 100 MG capsule     . nystatin (MYCOSTATIN/NYSTOP) powder Apply 1 g topically daily as needed (rash).     Marland Kitchen omeprazole (PRILOSEC) 40 MG capsule Take 40 mg by mouth daily.     . ondansetron (ZOFRAN) 4 MG tablet     . oxyCODONE ER (XTAMPZA ER) 9 MG C12A Take 9 mg by mouth daily.    . solifenacin (VESICARE) 10 MG tablet Take 10 mg by mouth daily.    Marland Kitchen XTAMPZA ER 13.5 MG C12A Take by mouth daily as needed.     No current facility-administered medications for this visit.     Musculoskeletal: Strength & Muscle Tone: UTA Gait & Station: UTA Patient leans: N/A  Psychiatric Specialty Exam: Review of Systems  Gastrointestinal: Positive for nausea.  Musculoskeletal: Positive for back pain.  Psychiatric/Behavioral: Positive for sleep disturbance. The patient is nervous/anxious.   All other systems reviewed and are negative.   Last menstrual period 08/20/2014.There is no height or weight on file to calculate BMI.  General Appearance: Casual  Eye Contact:  Fair  Speech:  Clear and Coherent  Volume:  Normal  Mood:  Anxious  Affect:  Congruent  Thought Process:  Goal Directed and Descriptions of Associations: Intact  Orientation:  Full (Time, Place, and Person)  Thought Content: Logical   Suicidal Thoughts:  No  Homicidal Thoughts:  No  Memory:  Immediate;   Fair  Recent;    Fair Remote;   Fair  Judgement:  Fair  Insight:  Fair  Psychomotor Activity:  Normal  Concentration:  Concentration: Fair and Attention Span: Fair  Recall:  [employer] of Knowledge: Fair  Language: Fair  Akathisia:  No  Handed:  Right  AIMS (if indicated): UTA  Assets:  Communication Skills Desire for Improvement Housing Social Support  ADL's:  Intact  Cognition: WNL  Sleep:  Poor   Screenings: PHQ2-9   [place] Office Visit from 08/30/2020 in [facility] Video Visit from 06/30/2020 in [facility] Video Visit from 05/11/2020 in [facility]  PHQ-2 Total Score 0 0 4  PHQ-9 Total Score -- -- 14    [place] Office Visit from 08/30/2020 in [facility] Video Visit from 06/30/2020 in [facility] Error: Question 6 not populated No Risk       Assessment and Plan: NOLAH KRENZER is a 50 year old Caucasian female who has a history of depression, GAD, chronic pain was evaluated by telemedicine today.  Patient is currently struggling with nausea, unknown what could be contributing to this.  Discussed plan as noted below.  Plan MDD in remission Prozac 60 mg p.o. daily Wellbutrin 100 mg daily in the morning  GAD- unstable Patient was referred for CBT. Continue Prozac as prescribed Hydroxyzine 50-100 mg p.o. nightly as needed  Insomnia-unstable Discontinue Elavil for noncompliance. Patient advised to reach out to her primary care provider since she continues to be nauseous in spite of stopping the Elavil.  She is not interested in reducing prozac dose and reports nausea not due to the same. Will reassess.   Follow-up in clinic in 3 to 4 weeks or sooner if needed.  This note was generated in part or whole with voice recognition software. Voice recognition is usually quite accurate but there are transcription errors  that can and very often do occur. I apologize for any typographical errors that were not detected and corrected.        Ursula Alert, MD 09/19/2020, 5:06 PM

## 2020-09-20 DIAGNOSIS — R110 Nausea: Secondary | ICD-10-CM | POA: Insufficient documentation

## 2020-09-22 DIAGNOSIS — E6609 Other obesity due to excess calories: Secondary | ICD-10-CM | POA: Insufficient documentation

## 2020-09-27 ENCOUNTER — Telehealth: Payer: Medicare Other | Admitting: Psychiatry

## 2020-10-11 ENCOUNTER — Ambulatory Visit (INDEPENDENT_AMBULATORY_CARE_PROVIDER_SITE_OTHER): Payer: Medicare Other | Admitting: Obstetrics & Gynecology

## 2020-10-11 ENCOUNTER — Other Ambulatory Visit: Payer: Self-pay

## 2020-10-11 ENCOUNTER — Encounter: Payer: Self-pay | Admitting: Obstetrics & Gynecology

## 2020-10-11 VITALS — BP 122/80 | Ht 62.0 in | Wt 196.0 lb

## 2020-10-11 DIAGNOSIS — N951 Menopausal and female climacteric states: Secondary | ICD-10-CM

## 2020-10-11 DIAGNOSIS — N950 Postmenopausal bleeding: Secondary | ICD-10-CM | POA: Diagnosis not present

## 2020-10-11 MED ORDER — CLONIDINE 0.1 MG/24HR TD PTWK: 0.1000 mg | MEDICATED_PATCH | TRANSDERMAL | 11 refills | Status: DC
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
## 2020-10-17 ENCOUNTER — Other Ambulatory Visit: Payer: Self-pay

## 2020-10-17 ENCOUNTER — Telehealth (INDEPENDENT_AMBULATORY_CARE_PROVIDER_SITE_OTHER): Payer: Medicare Other | Admitting: Psychiatry

## 2020-10-17 ENCOUNTER — Encounter: Payer: Self-pay | Admitting: Psychiatry

## 2020-10-17 DIAGNOSIS — F411 Generalized anxiety disorder: Secondary | ICD-10-CM

## 2020-10-17 DIAGNOSIS — F3342 Major depressive disorder, recurrent, in full remission: Secondary | ICD-10-CM | POA: Diagnosis not present

## 2020-10-17 DIAGNOSIS — G4701 Insomnia due to medical condition: Secondary | ICD-10-CM

## 2020-10-17 NOTE — Progress Notes (Signed)
Virtual Visit via Video Note  I connected with Traci Davis on 10/17/20 at  3:00 PM EDT by a video enabled telemedicine application and verified that I am speaking with the correct person using two identifiers. Location Provider Location : [facility] Patient Location : Home  Participants: Patient , Provider    I discussed the limitations of evaluation and management by telemedicine and the availability of in person appointments. The patient expressed understanding and agreed to proceed.   I discussed the assessment and treatment plan with the patient. The patient was provided an opportunity to ask questions and all were answered. The patient agreed with the plan and demonstrated an understanding of the instructions.   The patient was advised to call back or seek an in-person evaluation if the symptoms worsen or if the condition fails to improve as anticipated.    [facility] MD OP Progress Note  10/17/2020 3:52 PM Traci Davis  MRN:  [id number]  Chief Complaint:  Chief Complaint   Follow-up; Depression    HPI: Traci Davis is a 50 year old Caucasian female, lives in [place], has a history of MDD, GAD, insomnia, cerebral palsy, diabetes mellitus was evaluated by telemedicine today.  Patient today reports she is currently sleeping better since being on clonidine 0.1 mg patch which she applies weekly.  She reports she tried the amitriptyline again after she spoke to Probation officer last visit.  It still did not help.  However she currently is not interested in further medication changes since sleep is getting better.  Currently anxious about her multiple medical problems including postmenopausal bleeding.  She has further work-up planned by her OB/GYN.  She continues to be nauseous and reports recently started on metoclopramide.  Denies suicidality, homicidality or perceptual disturbances.  Denies any other concerns today. Visit Diagnosis:    ICD-10-CM   1. MDD (major depressive disorder),  recurrent, in full remission ([place])  F33.42     2. GAD (generalized anxiety disorder)  F41.1     3. Insomnia due to medical condition  G47.01    pain, perimenopausal syndrome      Past Psychiatric History: I have reviewed past psychiatric history from progress note on 09/12/2017.  Past trials of Xanax, Klonopin, Cymbalta, Rozerem, Ambien, trazodone  Past Medical History:  Past Medical History:  Diagnosis Date   Anxiety    Arthritis    Asthma    Cerebral palsy ([place])    Complication of anesthesia    panic attacks before surgery   COPD (chronic obstructive pulmonary disease) ([place])    Depression    Diabetes mellitus without complication ([place])    GERD (gastroesophageal reflux disease)    Hypertension     Past Surgical History:  Procedure Laterality Date   CESAREAN SECTION  1995   CHOLECYSTECTOMY     DILATATION & CURETTAGE/HYSTEROSCOPY WITH MYOSURE N/A 02/20/2018   Procedure: DILATATION & CURETTAGE/HYSTEROSCOPY WITH MYOSURE ENDOMETRIAL POLYPECTOMY;  Surgeon: Will Bonnet, MD;  Location: [facility];  Service: Gynecology;  Laterality: N/A;   ESOPHAGOGASTRODUODENOSCOPY (EGD) WITH PROPOFOL N/A 01/01/2019   Procedure: ESOPHAGOGASTRODUODENOSCOPY (EGD) WITH PROPOFOL;  Surgeon: Lin Landsman, MD;  Location: [place];  Service: Gastroenterology;  Laterality: N/A;   FOOT CAPSULE RELEASE W/ PERCUTANEOUS HEEL CORD LENGTHENING, TIBIAL TENDON TRANSFER     age 1 ,and 2010-both legs   JOINT REPLACEMENT Right    both knees partial replacements dates unknown   knee replacment     x 5 per pt. last one- left knee  2014. has had 2 on R 3 on L   TYMPANOPLASTY WITH GRAFT Right 01/08/2018   Procedure: TYMPANOPLASTY WITH POSSIBLE OSSICULAR CHAIN RECONSTRUCTION;  Surgeon: [name], MD;  Location: [facility];  Service: ENT;  Laterality: Right;    Family Psychiatric History: Reviewed family psychiatric history from progress note on 09/12/2017  Family History:  Family History  Problem  Relation Age of Onset   CAD Father    Heart attack Brother    Sexual abuse Paternal Grandfather    Breast cancer Neg Hx     Social History: Reviewed social history from progress note on 09/12/2017 Social History   Socioeconomic History   Marital status: Single    Spouse name: Not on file   Number of children: 1   Years of education: Not on file   Highest education level: Associate degree: occupational, Hotel manager, or vocational program  Occupational History   Not on file  Tobacco Use   Smoking status: Never   Smokeless tobacco: Never  Vaping Use   Vaping Use: Never used  Substance and Sexual Activity   Alcohol use: No   Drug use: No   Sexual activity: Yes    Partners: Male    Birth control/protection: Condom  Other Topics Concern   Not on file  Social History Narrative   Not on file   Social Determinants of Health   Financial Resource Strain: Not on file  Food Insecurity: Not on file  Transportation Needs: Not on file  Physical Activity: Not on file  Stress: Not on file  Social Connections: Not on file    Allergies:  Allergies  Allergen Reactions   Meloxicam Other (See Comments)    Damage kidney   Morphine And Related Other (See Comments)    hallucinations   Vantin [Cefpodoxime] Nausea And Vomiting   Latex Itching   Baclofen Other (See Comments)    "makes cerebral palsy do adverse reaction on me", tightens muscles    Ciprofloxacin Itching and Nausea And Vomiting   Tape Rash    skin tears.  Paper tape is ok   Tramadol Nausea Only    Metabolic Disorder Labs: Lab Results  Component Value Date   HGBA1C 6.1 (H) 02/19/2015   No results found for: PROLACTIN No results found for: CHOL, TRIG, HDL, CHOLHDL, VLDL, LDLCALC Lab Results  Component Value Date   TSH 2.198 07/10/2016   TSH 2.891 02/19/2015    Therapeutic Level Labs: No results found for: LITHIUM No results found for: VALPROATE No components found for:  CBMZ  Current Medications: Current  Outpatient Medications  Medication Sig Dispense Refill   promethazine (PHENERGAN) 25 MG tablet Take by mouth.     acetaminophen (TYLENOL) 325 MG tablet Take 2 tablets (650 mg total) by mouth every 6 (six) hours as needed. 60 tablet 0   albuterol (PROVENTIL HFA;VENTOLIN HFA) 108 (90 BASE) MCG/ACT inhaler Inhale 2 puffs into the lungs 4 (four) times daily as needed. For shortness of breath and/or wheezing     amoxicillin (AMOXIL) 500 MG capsule Take 500 mg by mouth 3 (three) times daily.     ANORO ELLIPTA 62.5-25 MCG/INH AEPB      atorvastatin (LIPITOR) 20 MG tablet Take 1 tablet by mouth daily.     Blood Glucose Monitoring Suppl (GLUCOCOM BLOOD GLUCOSE MONITOR) DEVI Test daily before all meals/snacks and once before bedtime.     buPROPion (WELLBUTRIN) 100 MG tablet Take 1 tablet (100 mg total) by mouth every morning. 90 tablet 1  chlorzoxazone (PARAFON) 500 MG tablet Take 500 mg by mouth 3 (three) times daily.     cloNIDine (CATAPRES - DOSED IN MG/24 HR) 0.1 mg/24hr patch Place 1 patch (0.1 mg total) onto the skin once a week. 4 patch 11   CREON 36000 units CPEP capsule      dicyclomine (BENTYL) 10 MG capsule Take 10 mg by mouth as needed.      donepezil (ARICEPT) 10 MG tablet Take by mouth.     FLUoxetine (PROZAC) 20 MG capsule Take 1 capsule (20 mg total) by mouth daily. To be combined with 40 mg 90 capsule 1   FLUoxetine (PROZAC) 40 MG capsule Take 1 capsule (40 mg total) by mouth daily. To be combined with 20 mg 90 capsule 1   fluticasone (FLONASE) 50 MCG/ACT nasal spray 1 spray into each nostril two (2) times a day.     furosemide (LASIX) 40 MG tablet Take 40 mg by mouth daily as needed for fluid.      glimepiride (AMARYL) 4 MG tablet Take 4 mg by mouth daily with breakfast.     glucose blood test strip USE TO TEST BLOOD SUGAR TWO TIMES A DAY     glucose blood test strip USE TO TEST BLOOD SUGAR TWO TIMES A DAY     incobotulinumtoxinA (XEOMIN) 100 units SOLR injection Inject 100 Units into  the muscle every 3 (three) months.      Lancets (FREESTYLE) lancets Test daily before all meals/snacks     lidocaine (LIDODERM) 5 % Place onto the skin as needed.      LIDOPRO 4-4-5 % PTCH 1 patch patch every twelve hours     LINZESS 145 MCG CAPS capsule Take 1 capsule by mouth every morning  for constipation     metFORMIN (GLUCOPHAGE-XR) 500 MG 24 hr tablet Take by mouth.     mirabegron ER (MYRBETRIQ) 25 MG TB24 tablet Take 1 tablet by mouth daily.     naloxone (NARCAN) nasal spray 4 mg/0.1 mL One spray in either nostril once for known/suspected opioid overdose. May repeat every 2-3 minutes in alternating nostril til EMS arrives     nitrofurantoin (MACRODANTIN) 100 MG capsule      nystatin (MYCOSTATIN/NYSTOP) powder Apply 1 g topically daily as needed (rash).      omeprazole (PRILOSEC) 40 MG capsule Take 40 mg by mouth daily.      ondansetron (ZOFRAN) 4 MG tablet      oxyCODONE (OXY IR/ROXICODONE) 5 MG immediate release tablet Take 5 mg by mouth 4 (four) times daily as needed.     oxyCODONE ER (XTAMPZA ER) 9 MG C12A Take 9 mg by mouth daily.     solifenacin (VESICARE) 10 MG tablet Take 10 mg by mouth daily.     XTAMPZA ER 13.5 MG C12A Take by mouth daily as needed.     No current facility-administered medications for this visit.     Musculoskeletal: Strength & Muscle Tone:  UTA Gait & Station:  UTA Patient leans: N/A  Psychiatric Specialty Exam: Review of Systems  Constitutional:  Positive for fatigue.  Gastrointestinal:  Positive for nausea.  Genitourinary:  Positive for menstrual problem.  Musculoskeletal:  Positive for back pain.  Psychiatric/Behavioral:  Positive for sleep disturbance. The patient is nervous/anxious.   All other systems reviewed and are negative.  Last menstrual period 08/20/2014.There is no height or weight on file to calculate BMI.  General Appearance: Casual  Eye Contact:  Good  Speech:  Clear and  Coherent  Volume:  Normal  Mood:  Anxious  Affect:   Congruent  Thought Process:  Goal Directed and Descriptions of Associations: Intact  Orientation:  Full (Time, Place, and Person)  Thought Content: Logical   Suicidal Thoughts:  No  Homicidal Thoughts:  No  Memory:  Immediate;   Fair Recent;   Fair Remote;   Fair  Judgement:  Fair  Insight:  Fair  Psychomotor Activity:  Normal  Concentration:  Concentration: Fair and Attention Span: Fair  Recall:  [employer] of Knowledge: Fair  Language: Fair  Akathisia:  No  Handed:  Right  AIMS (if indicated): not done  Assets:  Communication Skills Desire for Improvement Financial Resources/Insurance Social Support  ADL's:  Intact  Cognition: WNL  Sleep:   Improving   Screenings: PHQ2-9    [place] Office Visit from 08/30/2020 in [place] Video Visit from 06/30/2020 in [place] Video Visit from 05/11/2020 in [place]  PHQ-2 Total Score 0 0 4  PHQ-9 Total Score -- -- 14      [place] Office Visit from 08/30/2020 in [place] Video Visit from 06/30/2020 in [place] Error: Question 6 not populated No Risk        Assessment and Plan: Traci Davis is a 50 year old Caucasian female who has a history of depression, GAD, chronic pain was evaluated by telemedicine today.  Patient with multiple medical problems including recent perimenopausal symptoms, uterine bleeding.  Patient will benefit from the following plan.  Plan MDD in remission Prozac 60 mg p.o. daily Wellbutrin 100 mg p.o. daily in the morning  GAD-improving Patient was referred for CBT.  Provided community resources. Prozac as prescribed Hydroxyzine 50-100 mg p.o. nightly as needed  Insomnia-improving Since being on clonidine patch weekly for perimenopausal symptoms she is sleeping better.   Discussed with patient drug to drug interaction  between metoclopramide, Prozac, Wellbutrin.  Advised to stop the metoclopramide.  Follow-up in clinic in 2 months or sooner if needed.  Advised patient to sign a release to obtain medical records from her providers due to multiple medical problems and work-up being plan.  This note was generated in part or whole with voice recognition software. Voice recognition is usually quite accurate but there are transcription errors that can and very often do occur. I apologize for any typographical errors that were not detected and corrected.       Ursula Alert, MD 10/18/2020, 9:35 AM

## 2020-10-20 ENCOUNTER — Ambulatory Visit (HOSPITAL_COMMUNITY): Payer: Medicare Other

## 2020-10-26 ENCOUNTER — Ambulatory Visit: Payer: Medicare Other | Admitting: Obstetrics & Gynecology

## 2020-11-03 ENCOUNTER — Other Ambulatory Visit: Payer: Self-pay

## 2020-11-03 ENCOUNTER — Encounter: Payer: Self-pay | Admitting: Obstetrics & Gynecology

## 2020-11-03 ENCOUNTER — Ambulatory Visit (INDEPENDENT_AMBULATORY_CARE_PROVIDER_SITE_OTHER): Payer: Medicare Other | Admitting: Obstetrics & Gynecology

## 2020-11-03 VITALS — BP 140/90 | Ht 62.0 in | Wt 190.0 lb

## 2020-11-03 DIAGNOSIS — N950 Postmenopausal bleeding: Secondary | ICD-10-CM | POA: Diagnosis not present

## 2020-11-03 DIAGNOSIS — N951 Menopausal and female climacteric states: Secondary | ICD-10-CM | POA: Diagnosis not present

## 2020-11-03 MED ORDER — FLUCONAZOLE 150 MG PO TABS: 150.0000 mg | ORAL_TABLET | Freq: Once | ORAL | 3 refills | Status: AC

## 2020-11-03 MED ORDER — SULFAMETHOXAZOLE-TRIMETHOPRIM 800-160 MG PO TABS: 1.0000 | ORAL_TABLET | Freq: Two times a day (BID) | ORAL | 0 refills | Status: AC
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
## 2020-11-07 ENCOUNTER — Telehealth: Payer: Self-pay

## 2020-11-07 NOTE — Telephone Encounter (Signed)
Pt calling for refill of clonidine; [facility] in [place].  [phone]  Adv pt she should have refills for a year on file at her pharm.

## 2020-11-16 ENCOUNTER — Other Ambulatory Visit: Payer: Self-pay | Admitting: Psychiatry

## 2020-11-16 DIAGNOSIS — F330 Major depressive disorder, recurrent, mild: Secondary | ICD-10-CM

## 2020-11-16 NOTE — Telephone Encounter (Signed)
received fax requesting a refill on the bupropion hcl 100mg 

## 2020-11-21 ENCOUNTER — Telehealth: Payer: Self-pay

## 2020-11-21 DIAGNOSIS — F330 Major depressive disorder, recurrent, mild: Secondary | ICD-10-CM

## 2020-11-21 DIAGNOSIS — F411 Generalized anxiety disorder: Secondary | ICD-10-CM

## 2020-11-21 MED ORDER — BUPROPION HCL 100 MG PO TABS: 100.0000 mg | ORAL_TABLET | Freq: Every morning | ORAL | 0 refills | Status: DC

## 2020-11-21 MED ORDER — FLUOXETINE HCL 20 MG PO CAPS: 20.0000 mg | ORAL_CAPSULE | Freq: Every day | ORAL | 1 refills | Status: DC

## 2020-11-21 MED ORDER — FLUOXETINE HCL 40 MG PO CAPS: 40.0000 mg | ORAL_CAPSULE | Freq: Every day | ORAL | 1 refills | Status: DC

## 2020-11-21 NOTE — Telephone Encounter (Signed)
pt called states that she has to use mail order now so she needs her medications to go to [facility]

## 2020-11-21 NOTE — Telephone Encounter (Signed)
I have sent medications-Prozac, bupropion to [facility]

## 2020-12-05 ENCOUNTER — Other Ambulatory Visit: Payer: Self-pay | Admitting: Obstetrics & Gynecology

## 2020-12-05 MED ORDER — CLONIDINE 0.1 MG/24HR TD PTWK: 0.1000 mg | MEDICATED_PATCH | TRANSDERMAL | 11 refills | Status: DC

## 2020-12-06 ENCOUNTER — Other Ambulatory Visit: Payer: Self-pay | Admitting: Obstetrics & Gynecology

## 2020-12-06 DIAGNOSIS — N950 Postmenopausal bleeding: Secondary | ICD-10-CM

## 2020-12-22 ENCOUNTER — Other Ambulatory Visit: Payer: Self-pay

## 2020-12-22 ENCOUNTER — Telehealth (HOSPITAL_BASED_OUTPATIENT_CLINIC_OR_DEPARTMENT_OTHER): Payer: Self-pay | Admitting: Psychiatry

## 2020-12-22 DIAGNOSIS — Z5329 Procedure and treatment not carried out because of patient's decision for other reasons: Secondary | ICD-10-CM

## 2020-12-22 DIAGNOSIS — Z91199 Patient's noncompliance with other medical treatment and regimen due to unspecified reason: Secondary | ICD-10-CM | POA: Insufficient documentation

## 2020-12-22 NOTE — Progress Notes (Signed)
No response to call or text or video invite  Attempted to contact the phone number she provided last [phone] to leave voicemail.  Sent a text message with the video invite to this phone number also.

## 2020-12-29 ENCOUNTER — Other Ambulatory Visit: Payer: Self-pay

## 2020-12-29 ENCOUNTER — Encounter: Payer: Self-pay | Admitting: Psychiatry

## 2020-12-29 ENCOUNTER — Telehealth (INDEPENDENT_AMBULATORY_CARE_PROVIDER_SITE_OTHER): Payer: Medicare Other | Admitting: Psychiatry

## 2020-12-29 DIAGNOSIS — G4701 Insomnia due to medical condition: Secondary | ICD-10-CM

## 2020-12-29 DIAGNOSIS — F411 Generalized anxiety disorder: Secondary | ICD-10-CM

## 2020-12-29 DIAGNOSIS — F3342 Major depressive disorder, recurrent, in full remission: Secondary | ICD-10-CM

## 2020-12-29 NOTE — Progress Notes (Signed)
Virtual Visit via Video Note  I connected with Traci Davis on 12/29/20 at  2:00 PM EDT by a video enabled telemedicine application and verified that I am speaking with the correct person using two identifiers.  Location Provider Location : [facility] Patient Location : Home  Participants: Patient , Provider   I discussed the limitations of evaluation and management by telemedicine and the availability of in person appointments. The patient expressed understanding and agreed to proceed.    I discussed the assessment and treatment plan with the patient. The patient was provided an opportunity to ask questions and all were answered. The patient agreed with the plan and demonstrated an understanding of the instructions.   The patient was advised to call back or seek an in-person evaluation if the symptoms worsen or if the condition fails to improve as anticipated.  [facility] MD OP Progress Note  12/30/2020 8:24 AM Traci Davis  MRN:  [id number]  Chief Complaint:  Chief Complaint   Follow-up; Anxiety; Depression    HPI: Traci Davis is a 50 year old Caucasian female, lives in [place], on [place], has a history of MDD, GAD, insomnia, cerebral palsy, diabetes melitis was evaluated by telemedicine today.  Patient today reports she is currently recovering from COVID-19 infection.  She reports she was treated with Paxlovid.  She also went through a COPD flareup and is currently under the treatment of her primary care provider who is managing it for her.  She continues to struggle with fatigue, sore throat, postnasal drip.  She reports she is having a difficult time with her current symptoms.  Patient does report anxiety about her current medical problems.  Otherwise reports her current medications like fluoxetine, Wellbutrin is beneficial.  She is not interested in dosage change.  She is compliant on them.  Reports sleep is overall getting better.  She currently takes an over-the-counter medication called  Relaxium.  That has been helpful.  She denies any suicidality, homicidality or perceptual disturbances.  Patient denies any other concerns today.  Visit Diagnosis:    ICD-10-CM   1. MDD (major depressive disorder), recurrent, in full remission ([place])  F33.42     2. GAD (generalized anxiety disorder)  F41.1     3. Insomnia due to medical condition  G47.01    pain, mood, perimenopausal symptoms      Past Psychiatric History: Reviewed past psychiatric history from progress note on 09/12/2017.  Past trials of Xanax, Klonopin, Cymbalta, Rozerem, Ambien, trazodone  Past Medical History:  Past Medical History:  Diagnosis Date   Anxiety    Arthritis    Asthma    Cerebral palsy ([place])    Complication of anesthesia    panic attacks before surgery   COPD (chronic obstructive pulmonary disease) ([place])    Depression    Diabetes mellitus without complication ([place])    GERD (gastroesophageal reflux disease)    Hypertension     Past Surgical History:  Procedure Laterality Date   CESAREAN SECTION  1995   CHOLECYSTECTOMY     DILATATION & CURETTAGE/HYSTEROSCOPY WITH MYOSURE N/A 02/20/2018   Procedure: DILATATION & CURETTAGE/HYSTEROSCOPY WITH MYOSURE ENDOMETRIAL POLYPECTOMY;  Surgeon: Will Bonnet, MD;  Location: [facility];  Service: Gynecology;  Laterality: N/A;   ESOPHAGOGASTRODUODENOSCOPY (EGD) WITH PROPOFOL N/A 01/01/2019   Procedure: ESOPHAGOGASTRODUODENOSCOPY (EGD) WITH PROPOFOL;  Surgeon: Lin Landsman, MD;  Location: [facility];  Service: Gastroenterology;  Laterality: N/A;   FOOT CAPSULE RELEASE W/ PERCUTANEOUS HEEL CORD LENGTHENING, TIBIAL TENDON TRANSFER  age 16 ,and 2010-both legs   JOINT REPLACEMENT Right    both knees partial replacements dates unknown   knee replacment     x 5 per pt. last one- left knee 2014. has had 2 on R 3 on L   TYMPANOPLASTY WITH GRAFT Right 01/08/2018   Procedure: TYMPANOPLASTY WITH POSSIBLE OSSICULAR CHAIN RECONSTRUCTION;  Surgeon:  [name], MD;  Location: [facility];  Service: ENT;  Laterality: Right;    Family Psychiatric History: Reviewed family psychiatric history from progress note on 09/12/2017  Family History:  Family History  Problem Relation Age of Onset   CAD Father    Heart attack Brother    Sexual abuse Paternal Grandfather    Breast cancer Neg Hx     Social History: Reviewed social history from progress note on 09/12/2017 Social History   Socioeconomic History   Marital status: Single    Spouse name: Not on file   Number of children: 1   Years of education: Not on file   Highest education level: Associate degree: occupational, Hotel manager, or vocational program  Occupational History   Not on file  Tobacco Use   Smoking status: Never   Smokeless tobacco: Never  Vaping Use   Vaping Use: Never used  Substance and Sexual Activity   Alcohol use: No   Drug use: No   Sexual activity: Yes    Partners: Male    Birth control/protection: Condom  Other Topics Concern   Not on file  Social History Narrative   Not on file   Social Determinants of Health   Financial Resource Strain: Not on file  Food Insecurity: Not on file  Transportation Needs: Not on file  Physical Activity: Not on file  Stress: Not on file  Social Connections: Not on file    Allergies:  Allergies  Allergen Reactions   Meloxicam Other (See Comments)    Damage kidney   Morphine And Related Other (See Comments)    hallucinations   Vantin [Cefpodoxime] Nausea And Vomiting   Latex Itching   Baclofen Other (See Comments)    "makes cerebral palsy do adverse reaction on me", tightens muscles    Ciprofloxacin Itching and Nausea And Vomiting   Tape Rash    skin tears.  Paper tape is ok   Tramadol Nausea Only    Metabolic Disorder Labs: Lab Results  Component Value Date   HGBA1C 6.1 (H) 02/19/2015   No results found for: PROLACTIN No results found for: CHOL, TRIG, HDL, CHOLHDL, VLDL, LDLCALC Lab Results   Component Value Date   TSH 2.198 07/10/2016   TSH 2.891 02/19/2015    Therapeutic Level Labs: No results found for: LITHIUM No results found for: VALPROATE No components found for:  CBMZ  Current Medications: Current Outpatient Medications  Medication Sig Dispense Refill   cyclobenzaprine (FLEXERIL) 10 MG tablet Take by mouth.     diazepam (VALIUM) 5 MG tablet Take by mouth.     metFORMIN (GLUCOPHAGE-XR) 500 MG 24 hr tablet Take by mouth.     nystatin (MYCOSTATIN/NYSTOP) powder Apply topically.     omeprazole (PRILOSEC) 40 MG capsule Take by mouth.     oxyCODONE (OXY IR/ROXICODONE) 5 MG immediate release tablet Take by mouth.     oxyCODONE ER (XTAMPZA ER) 13.5 MG C12A Take by mouth.     promethazine (PHENERGAN) 25 MG tablet Take by mouth.     acetaminophen (TYLENOL) 325 MG tablet Take 2 tablets (650 mg total) by mouth every 6 (  six) hours as needed. 60 tablet 0   albuterol (PROVENTIL HFA;VENTOLIN HFA) 108 (90 BASE) MCG/ACT inhaler Inhale 2 puffs into the lungs 4 (four) times daily as needed. For shortness of breath and/or wheezing     ANORO ELLIPTA 62.5-25 MCG/INH AEPB      atorvastatin (LIPITOR) 20 MG tablet Take 1 tablet by mouth daily.     Blood Glucose Monitoring Suppl (GLUCOCOM BLOOD GLUCOSE MONITOR) DEVI Test daily before all meals/snacks and once before bedtime.     buPROPion (WELLBUTRIN) 100 MG tablet Take 1 tablet (100 mg total) by mouth every morning. 90 tablet 0   chlorzoxazone (PARAFON) 500 MG tablet Take 500 mg by mouth 3 (three) times daily.     cloNIDine (CATAPRES - DOSED IN MG/24 HR) 0.1 mg/24hr patch Place 1 patch (0.1 mg total) onto the skin once a week. 4 patch 11   CREON 36000 units CPEP capsule      diclofenac Sodium (VOLTAREN) 1 % GEL Apply 1 application topically 4 (four) times daily.     dicyclomine (BENTYL) 10 MG capsule Take 10 mg by mouth as needed.      donepezil (ARICEPT) 10 MG tablet Take by mouth.     FLUoxetine (PROZAC) 20 MG capsule Take 1 capsule  (20 mg total) by mouth daily. To be combined with 40 mg 90 capsule 1   FLUoxetine (PROZAC) 40 MG capsule Take 1 capsule (40 mg total) by mouth daily. To be combined with 20 mg 90 capsule 1   fluticasone (FLONASE) 50 MCG/ACT nasal spray 1 spray into each nostril two (2) times a day.     furosemide (LASIX) 40 MG tablet Take 40 mg by mouth daily as needed for fluid.      glimepiride (AMARYL) 4 MG tablet Take 4 mg by mouth daily with breakfast.     glucose blood test strip USE TO TEST BLOOD SUGAR TWO TIMES A DAY     glucose blood test strip USE TO TEST BLOOD SUGAR TWO TIMES A DAY     incobotulinumtoxinA (XEOMIN) 100 units SOLR injection Inject 100 Units into the muscle every 3 (three) months.      Lancets (FREESTYLE) lancets Test daily before all meals/snacks     lidocaine (LIDODERM) 5 % Place onto the skin as needed.      LIDOPRO 4-4-5 % PTCH 1 patch patch every twelve hours     LINZESS 145 MCG CAPS capsule Take 1 capsule by mouth every morning  for constipation     metFORMIN (GLUCOPHAGE-XR) 500 MG 24 hr tablet Take by mouth.     mirabegron ER (MYRBETRIQ) 25 MG TB24 tablet Take 1 tablet by mouth daily.     naloxone (NARCAN) nasal spray 4 mg/0.1 mL One spray in either nostril once for known/suspected opioid overdose. May repeat every 2-3 minutes in alternating nostril til EMS arrives     nystatin (MYCOSTATIN/NYSTOP) powder Apply 1 g topically daily as needed (rash).      omeprazole (PRILOSEC) 40 MG capsule Take 40 mg by mouth daily.      ondansetron (ZOFRAN) 4 MG tablet      oxyCODONE (OXY IR/ROXICODONE) 5 MG immediate release tablet Take 5 mg by mouth 4 (four) times daily as needed.     oxyCODONE ER (XTAMPZA ER) 9 MG C12A Take 9 mg by mouth daily.     PAXLOVID, 300/100, 20 x 150 MG & 10 x '100MG'$  TBPK SMARTSIG:3 By Mouth Twice Daily     promethazine (PHENERGAN) 25  MG tablet Take by mouth.     solifenacin (VESICARE) 10 MG tablet Take 10 mg by mouth daily.     XTAMPZA ER 13.5 MG C12A Take by mouth  daily as needed.     No current facility-administered medications for this visit.     Musculoskeletal: Strength & Muscle Tone:  UTA Gait & Station:  Wheel chair Patient leans:  UTA  Psychiatric Specialty Exam: Review of Systems  Constitutional:  Positive for fatigue.  HENT:  Positive for postnasal drip and sore throat.   Psychiatric/Behavioral:  The patient is nervous/anxious.   All other systems reviewed and are negative.  Last menstrual period 08/20/2014.There is no height or weight on file to calculate BMI.  General Appearance: Casual  Eye Contact:  Fair  Speech:  Clear and Coherent  Volume:  Normal  Mood:  Anxious  Affect:  Congruent  Thought Process:  Goal Directed and Descriptions of Associations: Intact  Orientation:  Full (Time, Place, and Person)  Thought Content: Logical   Suicidal Thoughts:  No  Homicidal Thoughts:  No  Memory:  Immediate;   Fair Recent;   Fair Remote;   Fair  Judgement:  Fair  Insight:  Fair  Psychomotor Activity:  Normal  Concentration:  Concentration: Fair and Attention Span: Fair  Recall:  [employer] of Knowledge: Fair  Language: Fair  Akathisia:  No  Handed:  Right  AIMS (if indicated): not done  Assets:  Communication Skills Housing Social Support  ADL's:  Intact  Cognition: WNL  Sleep:  Fair   Screenings: PHQ2-9    [place] Office Visit from 08/30/2020 in [facility] Video Visit from 06/30/2020 in [facility] Video Visit from 05/11/2020 in [place] [facility]  PHQ-2 Total Score 0 0 4  PHQ-9 Total Score -- -- 14      [place] Office Visit from 08/30/2020 in [facility] Video Visit from 06/30/2020 in [place] Error: Question 6 not populated No Risk        Assessment and Plan: AAMYA KIENTZ is a 50 year old Caucasian female who has a history of depression,  GAD, chronic pain was evaluated by telemedicine today.  Patient is currently going through COPD flareup, as well as recovering from COVID-19 infection.  Patient will benefit from following plan.  Plan MDD in remission Prozac 60 mg p.o. daily Wellbutrin 100 mg p.o. daily in the morning  GAD-stable Prozac as prescribed  Insomnia-stable Patient is currently on clonidine patch weekly for perimenopausal symptoms. She is also on over-the-counter medication Relaxium -which is a combination of magnesium, melatonin,valerian, ashwagandha.  Patient to continue to follow-up with primary care provider for management of COVID-19 as well as COPD flareup.  Follow-up in clinic in 2 months or sooner if needed.  This note was generated in part or whole with voice recognition software. Voice recognition is usually quite accurate but there are transcription errors that can and very often do occur. I apologize for any typographical errors that were not detected and corrected.        Ursula Alert, MD 12/30/2020, 8:24 AM

## 2021-01-17 ENCOUNTER — Telehealth: Payer: Self-pay | Admitting: Psychiatry

## 2021-01-17 DIAGNOSIS — F330 Major depressive disorder, recurrent, mild: Secondary | ICD-10-CM

## 2021-01-17 MED ORDER — BUPROPION HCL 100 MG PO TABS: 100.0000 mg | ORAL_TABLET | Freq: Every morning | ORAL | 0 refills | Status: DC

## 2021-01-17 NOTE — Telephone Encounter (Signed)
I have sent a prescription clarification for Wellbutrin to [facility].

## 2021-03-02 ENCOUNTER — Encounter: Payer: Self-pay | Admitting: Psychiatry

## 2021-03-02 ENCOUNTER — Telehealth (INDEPENDENT_AMBULATORY_CARE_PROVIDER_SITE_OTHER): Payer: Medicare Other | Admitting: Psychiatry

## 2021-03-02 ENCOUNTER — Other Ambulatory Visit: Payer: Self-pay

## 2021-03-02 DIAGNOSIS — G4701 Insomnia due to medical condition: Secondary | ICD-10-CM

## 2021-03-02 DIAGNOSIS — F3342 Major depressive disorder, recurrent, in full remission: Secondary | ICD-10-CM | POA: Diagnosis not present

## 2021-03-02 DIAGNOSIS — F411 Generalized anxiety disorder: Secondary | ICD-10-CM | POA: Diagnosis not present

## 2021-03-02 NOTE — Progress Notes (Signed)
Virtual Visit via Video Note  I connected with Traci Davis on 03/02/21 at  4:00 PM EDT by a video enabled telemedicine application and verified that I am speaking with the correct person using two identifiers.  Location Provider Location : [facility] Patient Location : Home  Participants: Patient , Provider   I discussed the limitations of evaluation and management by telemedicine and the availability of in person appointments. The patient expressed understanding and agreed to proceed.   I discussed the assessment and treatment plan with the patient. The patient was provided an opportunity to ask questions and all were answered. The patient agreed with the plan and demonstrated an understanding of the instructions.   The patient was advised to call back or seek an in-person evaluation if the symptoms worsen or if the condition fails to improve as anticipated.                                                         [place] MD OP Progress Note  03/02/2021 4:18 PM TATAYANA BESHEARS  MRN:  [id number]  Chief Complaint:  Chief Complaint   Follow-up; Anxiety    HPI: Traci Davis is a 50 year old Caucasian female, lives in [place], on [place], has a history of MDD, GAD, insomnia, cerebral palsy, diabetes melitis was evaluated by telemedicine today.  Patient today reports she is currently struggling with uncontrolled diabetes mellitus, was recently started on insulin.  She has been trying to watch what she eats however continues to have problems with her diabetes not being under control and that is frustrating for her.  Patient reports she is currently compliant on her Prozac and Wellbutrin.  Denies any significant depression.  Reports sleep is good.  Patient continues to struggle with pain and is trying to get physical therapy at home.  Patient denies any suicidality, homicidality or perceptual disturbances.  Patient denies any other concerns today.  Visit Diagnosis:    ICD-10-CM   1. MDD  (major depressive disorder), recurrent, in full remission ([place])  F33.42     2. GAD (generalized anxiety disorder)  F41.1     3. Insomnia due to medical condition  G47.01    pain, mood      Past Psychiatric History: Reviewed past psychiatric history from progress note on 09/12/2017.  Past trials of Xanax, Klonopin, Cymbalta, Rozerem, Ambien, trazodone  Past Medical History:  Past Medical History:  Diagnosis Date   Anxiety    Arthritis    Asthma    Cerebral palsy ([place])    Complication of anesthesia    panic attacks before surgery   COPD (chronic obstructive pulmonary disease) ([place])    Depression    Diabetes mellitus without complication ([place])    GERD (gastroesophageal reflux disease)    Hypertension     Past Surgical History:  Procedure Laterality Date   CESAREAN SECTION  1995   CHOLECYSTECTOMY     DILATATION & CURETTAGE/HYSTEROSCOPY WITH MYOSURE N/A 02/20/2018   Procedure: DILATATION & CURETTAGE/HYSTEROSCOPY WITH MYOSURE ENDOMETRIAL POLYPECTOMY;  Surgeon: Will Bonnet, MD;  Location: [facility];  Service: Gynecology;  Laterality: N/A;   ESOPHAGOGASTRODUODENOSCOPY (EGD) WITH PROPOFOL N/A 01/01/2019   Procedure: ESOPHAGOGASTRODUODENOSCOPY (EGD) WITH PROPOFOL;  Surgeon: Lin Landsman, MD;  Location: [facility];  Service: Gastroenterology;  Laterality: N/A;   FOOT  CAPSULE RELEASE W/ PERCUTANEOUS HEEL CORD LENGTHENING, TIBIAL TENDON TRANSFER     age 86 ,and 2010-both legs   JOINT REPLACEMENT Right    both knees partial replacements dates unknown   knee replacment     x 5 per pt. last one- left knee 2014. has had 2 on R 3 on L   TYMPANOPLASTY WITH GRAFT Right 01/08/2018   Procedure: TYMPANOPLASTY WITH POSSIBLE OSSICULAR CHAIN RECONSTRUCTION;  Surgeon: [name], MD;  Location: [facility];  Service: ENT;  Laterality: Right;    Family Psychiatric History: Reviewed family psychiatric history from progress note on 09/12/2017  Family History:  Family History  Problem  Relation Age of Onset   CAD Father    Heart attack Brother    Sexual abuse Paternal Grandfather    Breast cancer Neg Hx     Social History: Reviewed social history from progress note on 09/12/2017 Social History   Socioeconomic History   Marital status: Single    Spouse name: Not on file   Number of children: 1   Years of education: Not on file   Highest education level: Associate degree: occupational, Hotel manager, or vocational program  Occupational History   Not on file  Tobacco Use   Smoking status: Never   Smokeless tobacco: Never  Vaping Use   Vaping Use: Never used  Substance and Sexual Activity   Alcohol use: No   Drug use: No   Sexual activity: Yes    Partners: Male    Birth control/protection: Condom  Other Topics Concern   Not on file  Social History Narrative   Not on file   Social Determinants of Health   Financial Resource Strain: Not on file  Food Insecurity: Not on file  Transportation Needs: Not on file  Physical Activity: Not on file  Stress: Not on file  Social Connections: Not on file    Allergies:  Allergies  Allergen Reactions   Meloxicam Other (See Comments)    Damage kidney   Morphine And Related Other (See Comments)    hallucinations   Vantin [Cefpodoxime] Nausea And Vomiting   Latex Itching   Baclofen Other (See Comments)    "makes cerebral palsy do adverse reaction on me", tightens muscles    Ciprofloxacin Itching and Nausea And Vomiting   Tape Rash    skin tears.  Paper tape is ok   Tramadol Nausea Only    Metabolic Disorder Labs: Lab Results  Component Value Date   HGBA1C 6.1 (H) 02/19/2015   No results found for: PROLACTIN No results found for: CHOL, TRIG, HDL, CHOLHDL, VLDL, LDLCALC Lab Results  Component Value Date   TSH 2.198 07/10/2016   TSH 2.891 02/19/2015    Therapeutic Level Labs: No results found for: LITHIUM No results found for: VALPROATE No components found for:  CBMZ  Current Medications: Current  Outpatient Medications  Medication Sig Dispense Refill   insulin glargine (LANTUS) 100 UNIT/ML Solostar Pen Inject into the skin.     Insulin Pen Needle (PEN NEEDLES 31GX5/16") 31G X 8 MM MISC Injection Frequency is 1 time per day; Dx Code: Type 2 Diabetes uncontrolled (E11.65)     lidocaine (LIDODERM) 5 % Place onto the skin.     metFORMIN (GLUCOPHAGE-XR) 500 MG 24 hr tablet Take by mouth.     acetaminophen (TYLENOL) 325 MG tablet Take 2 tablets (650 mg total) by mouth every 6 (six) hours as needed. 60 tablet 0   albuterol (PROVENTIL HFA;VENTOLIN HFA) 108 (90 BASE)  MCG/ACT inhaler Inhale 2 puffs into the lungs 4 (four) times daily as needed. For shortness of breath and/or wheezing     ANORO ELLIPTA 62.5-25 MCG/INH AEPB      atorvastatin (LIPITOR) 20 MG tablet Take 1 tablet by mouth daily.     Blood Glucose Monitoring Suppl (GLUCOCOM BLOOD GLUCOSE MONITOR) DEVI Test daily before all meals/snacks and once before bedtime.     buPROPion (WELLBUTRIN) 100 MG tablet Take 1 tablet (100 mg total) by mouth every morning. 90 tablet 0   chlorzoxazone (PARAFON) 500 MG tablet Take 500 mg by mouth 3 (three) times daily.     cloNIDine (CATAPRES - DOSED IN MG/24 HR) 0.1 mg/24hr patch Place 1 patch (0.1 mg total) onto the skin once a week. 4 patch 11   CREON 36000 units CPEP capsule      cyclobenzaprine (FLEXERIL) 10 MG tablet Take by mouth.     diazepam (VALIUM) 5 MG tablet Take by mouth.     diclofenac Sodium (VOLTAREN) 1 % GEL Apply 1 application topically 4 (four) times daily.     dicyclomine (BENTYL) 10 MG capsule Take 10 mg by mouth as needed.      donepezil (ARICEPT) 10 MG tablet Take by mouth.     fluconazole (DIFLUCAN) 150 MG tablet Take 150 mg by mouth once.     FLUoxetine (PROZAC) 20 MG capsule Take 1 capsule (20 mg total) by mouth daily. To be combined with 40 mg 90 capsule 1   FLUoxetine (PROZAC) 40 MG capsule Take 1 capsule (40 mg total) by mouth daily. To be combined with 20 mg 90 capsule 1    fluticasone (FLONASE) 50 MCG/ACT nasal spray 1 spray into each nostril two (2) times a day.     furosemide (LASIX) 40 MG tablet Take 40 mg by mouth daily as needed for fluid.      glimepiride (AMARYL) 4 MG tablet Take 4 mg by mouth daily with breakfast.     GLOBAL EASE INJECT PEN NEEDLES 31G X 5 MM MISC Inject into the skin.     glucose blood test strip USE TO TEST BLOOD SUGAR TWO TIMES A DAY     HYDROmorphone (DILAUDID) 2 MG tablet Take 2 mg by mouth 4 (four) times daily.     incobotulinumtoxinA (XEOMIN) 100 units SOLR injection Inject 100 Units into the muscle every 3 (three) months.      Lancets (FREESTYLE) lancets Test daily before all meals/snacks     LANTUS SOLOSTAR 100 UNIT/ML Solostar Pen Inject into the skin.     LIDOPRO 4-4-5 % PTCH 1 patch patch every twelve hours     LINZESS 145 MCG CAPS capsule Take 1 capsule by mouth every morning  for constipation     metFORMIN (GLUCOPHAGE-XR) 500 MG 24 hr tablet Take by mouth.     mirabegron ER (MYRBETRIQ) 25 MG TB24 tablet Take 1 tablet by mouth daily.     naloxone (NARCAN) nasal spray 4 mg/0.1 mL One spray in either nostril once for known/suspected opioid overdose. May repeat every 2-3 minutes in alternating nostril til EMS arrives     nystatin (MYCOSTATIN/NYSTOP) powder Apply topically.     omeprazole (PRILOSEC) 40 MG capsule Take by mouth.     ondansetron (ZOFRAN) 4 MG tablet      oxyCODONE (OXY IR/ROXICODONE) 5 MG immediate release tablet Take 5 mg by mouth 4 (four) times daily as needed.     oxyCODONE ER (XTAMPZA ER) 9 MG C12A Take 9 mg  by mouth daily.     PAXLOVID, 300/100, 20 x 150 MG & 10 x 100MG  TBPK SMARTSIG:3 By Mouth Twice Daily     predniSONE (DELTASONE) 20 MG tablet Take 20 mg by mouth 2 (two) times daily.     promethazine (PHENERGAN) 25 MG tablet Take by mouth.     solifenacin (VESICARE) 10 MG tablet Take 10 mg by mouth daily.     XTAMPZA ER 13.5 MG C12A Take by mouth daily as needed.     No current facility-administered  medications for this visit.     Musculoskeletal: Strength & Muscle Tone:  UTA Gait & Station:  Seated Patient leans: N/A  Psychiatric Specialty Exam: Review of Systems  Musculoskeletal:  Positive for arthralgias and back pain (chronic).  Psychiatric/Behavioral:  The patient is nervous/anxious.   All other systems reviewed and are negative.  Last menstrual period 08/20/2014.There is no height or weight on file to calculate BMI.  General Appearance: Casual  Eye Contact:  Fair  Speech:  Clear and Coherent  Volume:  Normal  Mood:  Anxious able to cope  Affect:  Congruent  Thought Process:  Goal Directed and Descriptions of Associations: Intact  Orientation:  Full (Time, Place, and Person)  Thought Content: Logical   Suicidal Thoughts:  No  Homicidal Thoughts:  No  Memory:  Immediate;   Fair Recent;   Fair Remote;   Fair  Judgement:  Fair  Insight:  Fair  Psychomotor Activity:  Normal  Concentration:  Concentration: Fair and Attention Span: Fair  Recall:  [employer] of Knowledge: Fair  Language: Fair  Akathisia:  No  Handed:  Right  AIMS (if indicated): done, 0  Assets:  Communication Skills Housing Social Support  ADL's:  Intact  Cognition: WNL  Sleep:  Fair   Screenings: PHQ2-9    [place] Office Visit from 08/30/2020 in [facility] Video Visit from 06/30/2020 in [facility] Video Visit from 05/11/2020 in [facility]  PHQ-2 Total Score 0 0 4  PHQ-9 Total Score -- -- 14      [place] [facility] Visit from 08/30/2020 in [facility] Video Visit from 06/30/2020 in [facility] Error: Question 6 not populated No Risk        Assessment and Plan: AMORY ZBIKOWSKI is a 50 year old Caucasian female who has a history of depression, GAD, chronic pain was evaluated by telemedicine today.  Patient with  uncontrolled diabetes mellitus, chronic pain continues to struggle with anxiety due to the same however overall reports mood symptoms are stable on the current medication regimen.  Plan as noted below.  Plan MDD in remission Prozac 60 mg p.o. daily Wellbutrin 100 mg p.o. daily in the morning  GAD-stable Prozac as prescribed  Insomnia-stable Continue Relaxium -a combination of magnesium, melatonin, valerian, ashwagandha. Patient will need sufficient pain management.  Follow-up in clinic in 3 months or sooner in person.  This note was generated in part or whole with voice recognition software. Voice recognition is usually quite accurate but there are transcription errors that can and very often do occur. I apologize for any typographical errors that were not detected and corrected.      Ursula Alert, MD 03/02/2021, 4:18 PM

## 2021-05-10 ENCOUNTER — Ambulatory Visit: Payer: Medicare Other | Attending: Orthopaedic Surgery

## 2021-05-10 DIAGNOSIS — G8929 Other chronic pain: Secondary | ICD-10-CM

## 2021-05-10 DIAGNOSIS — R262 Difficulty in walking, not elsewhere classified: Secondary | ICD-10-CM | POA: Diagnosis present

## 2021-05-10 DIAGNOSIS — M6281 Muscle weakness (generalized): Secondary | ICD-10-CM

## 2021-05-10 DIAGNOSIS — M25561 Pain in right knee: Secondary | ICD-10-CM | POA: Insufficient documentation

## 2021-05-10 NOTE — Therapy (Signed)
[facility] [address] [place], [address] Phone: [phone]   Fax:  [phone]  Physical Therapy Evaluation  Patient Details  Name: Traci Davis MRN: [id number] Date of Birth: [birth year] Referring Provider (PT): Vonna Kotyk, MD   Encounter Date: 05/10/2021   PT End of Session - 05/10/21 1625     Visit Number 1    Number of Visits 17    Date for PT Re-Evaluation 07/06/21    PT Start Time 1625    PT Stop Time 0175    PT Time Calculation (min) 49 min    Equipment Utilized During Treatment Other (comment)   rw, L KAFO   Activity Tolerance Patient tolerated treatment well    Behavior During Therapy [facility] for tasks assessed/performed             Past Medical History:  Diagnosis Date   Anxiety    Arthritis    Asthma    Cerebral palsy ([place])    Complication of anesthesia    panic attacks before surgery   COPD (chronic obstructive pulmonary disease) ([place])    Depression    Diabetes mellitus without complication ([place])    GERD (gastroesophageal reflux disease)    Hypertension     Past Surgical History:  Procedure Laterality Date   [place] N/A 02/20/2018   Procedure: DILATATION & CURETTAGE/HYSTEROSCOPY WITH MYOSURE ENDOMETRIAL POLYPECTOMY;  Surgeon: Will Bonnet, MD;  Location: [facility];  Service: Gynecology;  Laterality: N/A;   ESOPHAGOGASTRODUODENOSCOPY (EGD) WITH PROPOFOL N/A 01/01/2019   Procedure: ESOPHAGOGASTRODUODENOSCOPY (EGD) WITH PROPOFOL;  Surgeon: Lin Landsman, MD;  Location: [facility];  Service: Gastroenterology;  Laterality: N/A;   FOOT CAPSULE RELEASE W/ PERCUTANEOUS HEEL CORD LENGTHENING, TIBIAL TENDON TRANSFER     age 68 ,and 2010-both legs   JOINT REPLACEMENT Right    both knees partial replacements dates unknown   knee replacment     x 5 per pt. last one- left knee  2014. has had 2 on R 3 on L   TYMPANOPLASTY WITH GRAFT Right 01/08/2018   Procedure: TYMPANOPLASTY WITH POSSIBLE OSSICULAR CHAIN RECONSTRUCTION;  Surgeon: [name], MD;  Location: [facility];  Service: ENT;  Laterality: Right;    There were no vitals filed for this visit.    Subjective Assessment - 05/10/21 1632     Subjective R knee 7/10 currently, 9/10 at worst for the past 3 months.    Pertinent History R lateral knee pain. Was told that her pain was due to her Iliotibial band. Has R lateral knee and lateral patellar soreness. R LE is the stronger leg due to cerebral palsy L LE. L LE needs KAFO. R knee feels like it buckles at times when she first gets up off the bed. R knee cap feels like it pops up. Pt has to straighten out her leg a little prior to stand up from sitting (standing up from a 90 degree knee flexed position makes her knee buckle. Knee pain has been occuring almost a year, at least 9 months.Had home health PT for 3 visits which helped decrease R knee pain. Falls is a regular occurrence per pt. Pt states R lateral hip has been bothering her as well.    Limitations Lifting;House hold activities;Other (comment);Walking;Standing   sleeping   Patient Stated Goals Decrease pain. Wants to get back to walking outside.  Currently in Pain? Yes    Pain Score 7     Pain Location Knee    Pain Orientation Right    Pain Descriptors / Indicators Sore;Sharp;Shooting    Pain Type Chronic pain    Pain Radiating Towards R lateral knee to patella    Pain Onset More than a month ago   October 2018   Pain Frequency Constant    Aggravating Factors  standing on her R LE, sitting for too long.    Pain Relieving Factors R ankle DF/PF (ankle pumps), sitting from standing, heating blanket.                [facility] PT Assessment - 05/10/21 1630       Assessment   Medical Diagnosis R iliotibial band syndrome    Referring Provider (PT) Vonna Kotyk, MD    Onset Date/Surgical Date  04/18/21   Date PT referral signed. Chronic condition.   Hand Dominance Right    Prior Therapy yes since about 51 years old for cerebral palsy      Precautions   Precautions Fall    Required Braces or Orthoses --   uses left locking KAFO     Restrictions   Weight Bearing Restrictions No      Balance Screen   Has the patient fallen in the past 6 months Yes    How many times? 1   fell last weekend. Pt was standing up and wiping the tub and her R foot slipped and landed on both knees. Difficulty getting up and putting pressure on her knees due to pain.   Has the patient had a decrease in activity level because of a fear of falling?  Yes    Is the patient reluctant to leave their home because of a fear of falling?  Yes      Prior Function   Vocation On disability    Leisure Spending time with family outside.      Observation/Other Assessments   Observations decreased femoral control bilaterally. Difficulty with B hip flexion during gait    Focus on Therapeutic Outcomes (FOTO)  R knee FOTO 41      Strength   Right Hip Flexion 4-/5   in sitting; cues needed to decrease L trunk lateral shift compensation.   Right Hip Extension 4/5   seated manually resisted   Right Hip External Rotation  4-/5    Right Hip ABduction 4-/5   seated manually resisted clamshell   Left Hip Flexion 3-/5   in sitting   Left Hip Extension 3/5   seated manually resisted   Left Hip ABduction 3+/5   seated manually resisted clamshell   Right Knee Flexion 4/5    Right Knee Extension 4+/5    Left Knee Flexion 3/5    Left Knee Extension 2+/5      Palpation   Palpation comment muscle tension R lateral and medial  hamstrings, TTP R lateral knee joint line. No TTP R gerdy's tubercle. TTP R glute med and greater trochanter.      Ambulation/Gait   Gait Comments Gait: ambulates with rw, L KAFO locked in extension. Decreased B hip flexion, Decreased B ankle DF. Difficulty advancing L > R LE secondary to hip flexor  weakness observed. R knee genu valgus. R hip IR during stance phase                        Objective measurements completed on examination:  See above findings.      Gait: ambulates with rw, L KAFO locked in extension. Decreased B hip flexion, Decreased B ankle DF. Difficulty advancing L > R LE secondary to hip flexor weakness observed. R knee genu valgus. R hip IR during stance phase    Manual therapy  Seated STM R lateral hamstrings, vastus lateralis, IT band to decrease fascial restrictions and muscle tension.    Accompanied pt from clinic to car for safety due to fall risk    Response to treatment Decreased R lateral knee pain to 4-5/10 after session.    Clinical impression Pt is a 51 year old female who came to physical therapy secondary to R knee pain. She also presents with altered gait pattern and posture, decreased bilateral femoral control, B LE weakness, TTP R lateral knee and R lateral hip, and difficulty performing tasks such as ambulation and transfers. Pt will benefit from skilled physical therapy services to address the aforementioned deficits.                       PT Education - 05/10/21 1808     Education Details Plan of care    Person(s) Educated Patient    Methods Explanation    Comprehension Verbalized understanding              PT Short Term Goals - 05/10/21 1746       PT SHORT TERM GOAL #1   Title Pt will be independent with her initial HEP to decrease pain, improve strength and ability to perform standing tasks more comfortably.    Baseline Pt has not yet started her HEP (05/10/2021)    Time 3    Period Weeks    Status New    Target Date 06/01/21               PT Long Term Goals - 05/10/21 1751       PT LONG TERM GOAL #1   Title Pt will have a decrease in R knee pain to 5/10 or less at worst to promote ability to ambulate, as well as perform standing tasks more comfortably.    Baseline 9/10 R knee  pain at worst for the past 3 months (05/10/2021)    Time 8    Period Weeks    Status New    Target Date 07/06/21      PT LONG TERM GOAL #2   Title Pt will improve bilateral hip flexion, extension, abduction and R hip ER strength by at least 1/2 MMT grade to promote ability to ambulate with less difficulty, and more comfortably for her R knee.    Baseline Hip flexion 4-/5 R, 3-/5 L, hip extension 4/5 R 3/5 L, hip abduction 4-/5 R, 3+/5 L, R hip ER 4-/5 (05/10/2021)    Time 8    Period Weeks    Status New    Target Date 07/06/21      PT LONG TERM GOAL #3   Title Pt will improve her R knee FOTO score by at least 10 points as a demonstration of improved function.    Baseline R knee FOTO 41 (05/10/2021)    Time 8    Period Weeks    Status New    Target Date 07/06/21                    Plan - 05/10/21 1730     Clinical Impression Statement Pt is a 51  year old female who came to physical therapy secondary to R knee pain. She also presents with altered gait pattern and posture, decreased bilateral femoral control, B LE weakness, TTP R lateral knee and R lateral hip, and difficulty performing tasks such as ambulation and transfers. Pt will benefit from skilled physical therapy services to address the aforementioned deficits.    Personal Factors and Comorbidities Comorbidity 3+;Finances;Education;Fitness;Past/Current Experience;Social Background;Time since onset of injury/illness/exacerbation;Transportation    Comorbidities Anxiety, hx of falls, cerebral palsy, depression, DM, HTN    Examination-Activity Limitations Squat;Stairs;Locomotion Level;Stand;Transfers;Carry    Stability/Clinical Decision Making Stable/Uncomplicated    Clinical Decision Making Low    Rehab Potential Fair    PT Frequency 2x / week    PT Duration 8 weeks    PT Treatment/Interventions Therapeutic activities;Therapeutic exercise;Balance training;Electrical Stimulation;Iontophoresis 4mg /ml Dexamethasone;Gait  training;Neuromuscular re-education;Patient/family education;Manual techniques;Dry needling    PT Next Visit Plan trunk, hip and knee strengthening, femoral control, manual techniques, modalities PRN    Consulted and Agree with Plan of Care Patient             Patient will benefit from skilled therapeutic intervention in order to improve the following deficits and impairments:  Pain, Postural dysfunction, Improper body mechanics, Difficulty walking, Decreased strength, Decreased endurance, Decreased balance, Decreased activity tolerance, Abnormal gait  Visit Diagnosis: Chronic pain of right knee - Plan: PT plan of care cert/re-cert  Muscle weakness (generalized) - Plan: PT plan of care cert/re-cert  Difficulty in walking, not elsewhere classified - Plan: PT plan of care cert/re-cert     Problem List Patient Active Problem List   Diagnosis Date Noted   No-show for appointment 12/22/2020   Obesity due to excess calories 09/22/2020   Nausea 09/20/2020   Sinusitis 08/09/2020   Insomnia due to medical condition 05/11/2020   COPD (chronic obstructive pulmonary disease) ([place]) 03/22/2020   Hamstring tendonitis 01/29/2020   MDD (major depressive disorder), recurrent, in full remission ([place]) 08/12/2019   Seizure-like activity ([place]) 07/08/2019   Mild episode of recurrent major depressive disorder ([place]) 12/29/2018   Insomnia due to mental condition 12/29/2018   Disorder of vein 03/04/2018   Lumbar sprain 03/04/2018   Muscle weakness 03/04/2018   Neck sprain 03/04/2018   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   Postmenopausal bleeding 02/18/2018   Impingement syndrome of shoulder region 02/04/2018   Chest pain with moderate risk for cardiac etiology 05/19/2017   History of depression 04/15/2017   GAD (generalized anxiety disorder) 04/15/2017   Chronic daily headache 04/15/2017   Panic attack 04/15/2017   Nocturnal enuresis 04/15/2017   Mild cognitive impairment 04/15/2017   Loss of memory  01/14/2017   Pain medication agreement signed 01/08/2017   Headache disorder 12/04/2016   Chronic, continuous use of opioids 07/01/2015   Vertigo 07/01/2015   Chronic midline low back pain without sciatica 03/21/2015   Acute renal failure (ARF) ([place]) 02/19/2015   Type 2 diabetes mellitus without complication ([place]) 54/27/0623   Acute pancreatitis 09/04/2014   Cerebral palsy ([place]) 01/05/2014   Cerebral palsy with spastic/ataxic diplegia 01/05/2014   Hip pain, chronic 04/28/2013   Essential hypertension 01/10/2013   Irritable colon 01/10/2013   Noninfectious gastroenteritis and colitis 01/10/2013   Encounter for long-term (current) use of other medications 12/04/2012   Chronic GERD 08/12/2012   Epigastric pain 08/12/2012   Diarrhea 08/12/2012   Primary localized osteoarthrosis, lower leg 05/07/2012   Difficulty walking 02/06/2012    Joneen Boers PT, DPT  05/10/2021, 6:12 PM  [place]  [place] [address] [place], [address] Phone: [phone]   Fax:  [phone]  Name: Traci Davis MRN: [id number] Date of Birth: [birth year]

## 2021-05-15 ENCOUNTER — Ambulatory Visit: Payer: Medicare Other

## 2021-05-17 ENCOUNTER — Ambulatory Visit: Payer: Medicare Other

## 2021-05-17 DIAGNOSIS — G8929 Other chronic pain: Secondary | ICD-10-CM

## 2021-05-17 DIAGNOSIS — M6281 Muscle weakness (generalized): Secondary | ICD-10-CM

## 2021-05-17 DIAGNOSIS — M25561 Pain in right knee: Secondary | ICD-10-CM | POA: Diagnosis not present

## 2021-05-17 DIAGNOSIS — R262 Difficulty in walking, not elsewhere classified: Secondary | ICD-10-CM

## 2021-05-17 NOTE — Therapy (Signed)
[facility] [address] [place], [address] Phone: [phone]   Fax:  [phone]  Physical Therapy Treatment  Patient Details  Name: Traci Davis MRN: [id number] Date of Birth: [birth year] Referring Provider (PT): Vonna Kotyk, MD   Encounter Date: 05/17/2021   PT End of Session - 05/17/21 1634     Visit Number 2    Number of Visits 17    Date for PT Re-Evaluation 07/06/21    PT Start Time 1634    PT Stop Time 5573    PT Time Calculation (min) 30 min    Equipment Utilized During Treatment Other (comment)   rw, L KAFO   Activity Tolerance Patient tolerated treatment well    Behavior During Therapy [facility] for tasks assessed/performed             Past Medical History:  Diagnosis Date   Anxiety    Arthritis    Asthma    Cerebral palsy ([place])    Complication of anesthesia    panic attacks before surgery   COPD (chronic obstructive pulmonary disease) ([place])    Depression    Diabetes mellitus without complication ([place])    GERD (gastroesophageal reflux disease)    Hypertension     Past Surgical History:  Procedure Laterality Date   [place] N/A 02/20/2018   Procedure: DILATATION & CURETTAGE/HYSTEROSCOPY WITH MYOSURE ENDOMETRIAL POLYPECTOMY;  Surgeon: Will Bonnet, MD;  Location: [facility];  Service: Gynecology;  Laterality: N/A;   ESOPHAGOGASTRODUODENOSCOPY (EGD) WITH PROPOFOL N/A 01/01/2019   Procedure: ESOPHAGOGASTRODUODENOSCOPY (EGD) WITH PROPOFOL;  Surgeon: Lin Landsman, MD;  Location: [facility];  Service: Gastroenterology;  Laterality: N/A;   FOOT CAPSULE RELEASE W/ PERCUTANEOUS HEEL CORD LENGTHENING, TIBIAL TENDON TRANSFER     age 51 ,and 2010-both legs   JOINT REPLACEMENT Right    both knees partial replacements dates unknown   knee replacment     x 5 per pt. last one- left knee  2014. has had 2 on R 51 on L   TYMPANOPLASTY WITH GRAFT Right 01/08/2018   Procedure: TYMPANOPLASTY WITH POSSIBLE OSSICULAR CHAIN RECONSTRUCTION;  Surgeon: [name], MD;  Location: [facility];  Service: ENT;  Laterality: Right;    There were no vitals filed for this visit.   Subjective Assessment - 05/17/21 1640     Subjective Has a difficult time bringing her L leg forward when walking. Also slipped last Saturday at the bath tub taking care of her grandchild. No R knee pain currently. Has been massaging it.    Pertinent History R lateral knee pain. Was told that her pain was due to her Iliotibial band. Has R lateral knee and lateral patellar soreness. R LE is the stronger leg due to cerebral palsy L LE. L LE needs KAFO. R knee feels like it buckles at times when she first gets up off the bed. R knee cap feels like it pops up. Pt has to straighten out her leg a little prior to stand up from sitting (standing up from a 90 degree knee flexed position makes her knee buckle. Knee pain has been occuring almost a year, at least 9 months.Had home health PT for 3 visits which helped decrease R knee pain. Falls is a regular occurrence per pt. Pt states R lateral hip has been bothering her as well.    Limitations Lifting;House hold  activities;Other (comment);Walking;Standing   sleeping   Patient Stated Goals Decrease pain. Wants to get back to walking outside.    Currently in Pain? No/denies    Pain Onset More than a month ago   October 2018                                       PT Education - 05/17/21 1824     Education Details ther-ex    Person(s) Educated Patient    Methods Explanation;Demonstration;Tactile cues;Verbal cues    Comprehension Returned demonstration;Verbalized understanding            Objective           Therapeutic exercise  Seated hip flexion, emphasis on minimal to no trunk lateral lean compensation   R 10x  L 10x  Seated hip extension  isometrics  R 5x5 seconds for 3 sets  L 5x5 seconds for 3 sets  Seated knee extension   L 3x5 with 5 second holds   Improved exercise technique, movement at target joints, use of target muscles after mod verbal, visual, tactile cues.      Manual therapy  Seated STM R lateral hamstrings,  IT band to decrease fascial restrictions and muscle tension.      Accompanied pt from clinic to car for safety due to fall risk. Difficulty advancing L LE secondary to L knee locked in extension secondary to quad weakness as well as no L ankle DF observed for foot clearance during swing phase     Response to treatment No pain in R knee     Clinical impression Good carry over of decreased R knee pain compared to previous session with reports of no knee pain. Worked on bilateral hip flexion, hip extension and L knee extension strengthening to promote ability to ambulate with less difficulty. Difficulty advancing L LE secondary to L knee locked in extension secondary to quad weakness as well as no L ankle DF observed for foot clearance during swing phase. Pt tolerated session well without aggravation of symptoms. Pt will benefit from continued skilled physical therapy services to decrease pain, improve strength and function.      PT Short Term Goals - 05/10/21 1746       PT SHORT TERM GOAL #1   Title Pt will be independent with her initial HEP to decrease pain, improve strength and ability to perform standing tasks more comfortably.    Baseline Pt has not yet started her HEP (05/10/2021)    Time 3    Period Weeks    Status New    Target Date 06/01/21               PT Long Term Goals - 05/10/21 1751       PT LONG TERM GOAL #1   Title Pt will have a decrease in R knee pain to 5/10 or less at worst to promote ability to ambulate, as well as perform standing tasks more comfortably.    Baseline 9/10 R knee pain at worst for the past 3 months (05/10/2021)    Time 8    Period Weeks    Status  New    Target Date 07/06/21      PT LONG TERM GOAL #2   Title Pt will improve bilateral hip flexion, extension, abduction and R hip ER strength by at least 1/2 MMT grade to promote ability to  ambulate with less difficulty, and more comfortably for her R knee.    Baseline Hip flexion 4-/5 R, 3-/5 L, hip extension 4/5 R 3/5 L, hip abduction 4-/5 R, 3+/5 L, R hip ER 4-/5 (05/10/2021)    Time 8    Period Weeks    Status New    Target Date 07/06/21      PT LONG TERM GOAL #3   Title Pt will improve her R knee FOTO score by at least 10 points as a demonstration of improved function.    Baseline R knee FOTO 41 (05/10/2021)    Time 8    Period Weeks    Status New    Target Date 07/06/21                   Plan - 05/17/21 1648     Clinical Impression Statement Good carry over of decreased R knee pain compared to previous session with reports of no knee pain. Worked on bilateral hip flexion, hip extension and L knee extension strengthening to promote ability to ambulate with less difficulty. Difficulty advancing L LE secondary to L knee locked in extension secondary to quad weakness as well as no L ankle DF observed for foot clearance during swing phase. Pt tolerated session well without aggravation of symptoms. Pt will benefit from continued skilled physical therapy services to decrease pain, improve strength and function.    Personal Factors and Comorbidities Comorbidity 3+;Finances;Education;Fitness;Past/Current Experience;Social Background;Time since onset of injury/illness/exacerbation;Transportation    Comorbidities Anxiety, hx of falls, cerebral palsy, depression, DM, HTN    Examination-Activity Limitations Squat;Stairs;Locomotion Level;Stand;Transfers;Carry    Stability/Clinical Decision Making Stable/Uncomplicated    Rehab Potential Fair    PT Frequency 2x / week    PT Duration 8 weeks    PT Treatment/Interventions Therapeutic activities;Therapeutic exercise;Balance  training;Electrical Stimulation;Iontophoresis 4mg /ml Dexamethasone;Gait training;Neuromuscular re-education;Patient/family education;Manual techniques;Dry needling    PT Next Visit Plan trunk, hip and knee strengthening, femoral control, manual techniques, modalities PRN    Consulted and Agree with Plan of Care Patient             Patient will benefit from skilled therapeutic intervention in order to improve the following deficits and impairments:  Pain, Postural dysfunction, Improper body mechanics, Difficulty walking, Decreased strength, Decreased endurance, Decreased balance, Decreased activity tolerance, Abnormal gait  Visit Diagnosis: Chronic pain of right knee  Muscle weakness (generalized)  Difficulty in walking, not elsewhere classified     Problem List Patient Active Problem List   Diagnosis Date Noted   No-show for appointment 12/22/2020   Obesity due to excess calories 09/22/2020   Nausea 09/20/2020   Sinusitis 08/09/2020   Insomnia due to medical condition 05/11/2020   COPD (chronic obstructive pulmonary disease) ([place]) 03/22/2020   Hamstring tendonitis 01/29/2020   MDD (major depressive disorder), recurrent, in full remission ([place]) 08/12/2019   Seizure-like activity ([place]) 07/08/2019   Mild episode of recurrent major depressive disorder ([place]) 12/29/2018   Insomnia due to mental condition 12/29/2018   Disorder of vein 03/04/2018   Lumbar sprain 03/04/2018   Muscle weakness 03/04/2018   Neck sprain 03/04/2018   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   Postmenopausal bleeding 02/18/2018   Impingement syndrome of shoulder region 02/04/2018   Chest pain with moderate risk for cardiac etiology 05/19/2017   History of depression 04/15/2017   GAD (generalized anxiety disorder) 04/15/2017   Chronic daily headache 04/15/2017   Panic attack 04/15/2017   Nocturnal enuresis 04/15/2017   Mild cognitive impairment 04/15/2017  Loss of memory 01/14/2017   Pain medication  agreement signed 01/08/2017   Headache disorder 12/04/2016   Chronic, continuous use of opioids 07/01/2015   Vertigo 07/01/2015   Chronic midline low back pain without sciatica 03/21/2015   Acute renal failure (ARF) ([place]) 02/19/2015   Type 2 diabetes mellitus without complication ([place]) 63/78/5885   Acute pancreatitis 09/04/2014   Cerebral palsy ([place]) 01/05/2014   Cerebral palsy with spastic/ataxic diplegia 01/05/2014   Hip pain, chronic 04/28/2013   Essential hypertension 01/10/2013   Irritable colon 01/10/2013   Noninfectious gastroenteritis and colitis 01/10/2013   Encounter for long-term (current) use of other medications 12/04/2012   Chronic GERD 08/12/2012   Epigastric pain 08/12/2012   Diarrhea 08/12/2012   Primary localized osteoarthrosis, lower leg 05/07/2012   Difficulty walking 02/06/2012    Joneen Boers PT, DPT   05/17/2021, 6:29 PM  [facility] [address], [place], [address] Phone: [phone]   Fax:  [phone]  Name: Traci Davis MRN: [id number] Date of Birth: 1971-03-12

## 2021-05-22 ENCOUNTER — Ambulatory Visit: Payer: Medicare Other

## 2021-05-23 ENCOUNTER — Other Ambulatory Visit: Payer: Self-pay

## 2021-05-23 ENCOUNTER — Encounter: Payer: Self-pay | Admitting: Psychiatry

## 2021-05-23 ENCOUNTER — Ambulatory Visit (INDEPENDENT_AMBULATORY_CARE_PROVIDER_SITE_OTHER): Payer: Medicare Other | Admitting: Psychiatry

## 2021-05-23 VITALS — BP 165/91 | HR 97 | Temp 97.3°F | Wt 195.4 lb

## 2021-05-23 DIAGNOSIS — F3342 Major depressive disorder, recurrent, in full remission: Secondary | ICD-10-CM | POA: Diagnosis not present

## 2021-05-23 DIAGNOSIS — G4701 Insomnia due to medical condition: Secondary | ICD-10-CM | POA: Diagnosis not present

## 2021-05-23 DIAGNOSIS — F411 Generalized anxiety disorder: Secondary | ICD-10-CM

## 2021-05-23 MED ORDER — MIRTAZAPINE 7.5 MG PO TABS: 7.5000 mg | ORAL_TABLET | Freq: Every evening | ORAL | 1 refills | Status: DC | PRN

## 2021-05-23 MED ORDER — BUPROPION HCL 100 MG PO TABS: 100.0000 mg | ORAL_TABLET | Freq: Every morning | ORAL | 1 refills | Status: DC

## 2021-05-23 MED ORDER — FLUOXETINE HCL 40 MG PO CAPS: 40.0000 mg | ORAL_CAPSULE | Freq: Every day | ORAL | 1 refills | Status: DC

## 2021-05-23 NOTE — Patient Instructions (Signed)
Mirtazapine Tablets What is this medication? MIRTAZAPINE (mir TAZ a peen) treats depression. It increases the amount of serotonin and norepinephrine in the brain, hormones that help regulate mood. This medicine may be used for other purposes; ask your health care provider or pharmacist if you have questions. COMMON BRAND NAME(S): Remeron What should I tell my care team before I take this medication? They need to know if you have any of these conditions: Bipolar disorder Glaucoma Kidney disease Liver disease Suicidal thoughts An unusual or allergic reaction to mirtazapine, other medications, foods, dyes, or preservatives Pregnant or trying to get pregnant Breast-feeding How should I use this medication? Take this medication by mouth with a glass of water. Follow the directions on the prescription label. Take your medication at regular intervals. Do not take your medication more often than directed. Do not stop taking this medication suddenly except upon the advice of your care team. Stopping this medication too quickly may cause serious side effects or your condition may worsen. A special MedGuide will be given to you by the pharmacist with each prescription and refill. Be sure to read this information carefully each time. Talk to your care team about the use of this medication in children. Special care may be needed. Overdosage: If you think you have taken too much of this medicine contact a poison control center or emergency room at once. NOTE: This medicine is only for you. Do not share this medicine with others. What if I miss a dose? If you miss a dose, take it as soon as you can. If it is almost time for your next dose, take only that dose. Do not take double or extra doses. What may interact with this medication? Do not take this medication with any of the following: Linezolid MAOIs like Carbex, Eldepryl, Marplan, Nardil, and Parnate Methylene blue (injected into a vein) This  medication may also interact with the following: Alcohol Antiviral medications for HIV or AIDS Certain medications that treat or prevent blood clots like warfarin Certain medications for depression, anxiety, or psychotic disturbances Certain medications for fungal infections like ketoconazole and itraconazole Certain medications for migraine headache like almotriptan, eletriptan, frovatriptan, naratriptan, rizatriptan, sumatriptan, zolmitriptan Certain medications for seizures like carbamazepine or phenytoin Certain medications for sleep Cimetidine Erythromycin Fentanyl Lithium Medications for blood pressure Nefazodone Rasagiline Rifampin Supplements like St. John's wort, kava kava, valerian Tramadol Tryptophan This list may not describe all possible interactions. Give your health care provider a list of all the medicines, herbs, non-prescription drugs, or dietary supplements you use. Also tell them if you smoke, drink alcohol, or use illegal drugs. Some items may interact with your medicine. What should I watch for while using this medication? Tell your care team if your symptoms do not get better or if they get worse. Visit your care team for regular checks on your progress. Because it may take several weeks to see the full effects of this medication, it is important to continue your treatment as prescribed by your doctor. Patients and their families should watch out for new or worsening thoughts of suicide or depression. Also watch out for sudden changes in feelings such as feeling anxious, agitated, panicky, irritable, hostile, aggressive, impulsive, severely restless, overly excited and hyperactive, or not being able to sleep. If this happens, especially at the beginning of treatment or after a change in dose, call your care team. You may get drowsy or dizzy. Do not drive, use machinery, or do anything that needs mental alertness until  you know how this medication affects you. Do not  stand or sit up quickly, especially if you are an older patient. This reduces the risk of dizzy or fainting spells. Alcohol may interfere with the effect of this medication. Avoid alcoholic drinks. This medication may cause dry eyes and blurred vision. If you wear contact lenses you may feel some discomfort. Lubricating drops may help. See your eye care team if the problem does not go away or is severe. Your mouth may get dry. Chewing sugarless gum or sucking hard candy, and drinking plenty of water may help. Contact your care team if the problem does not go away or is severe. What side effects may I notice from receiving this medication? Side effects that you should report to your care team as soon as possible: Allergic reactions--skin rash, itching, hives, swelling of the face, lips, tongue, or throat Heart rhythm changes--fast or irregular heartbeat, dizziness, feeling faint or lightheaded, chest pain, trouble breathing Infection--fever, chills, cough, or sore throat Irritability, confusion, fast or irregular heartbeat, muscle stiffness, twitching muscles, sweating, high fever, seizure, chills, vomiting, diarrhea, which may be signs of serotonin syndrome Low sodium level--muscle weakness, fatigue, dizziness, headache, confusion Rash, fever, and swollen lymph nodes Redness, blistering, peeling or loosening of the skin, including inside the mouth Seizures Sudden eye pain or change in vision such as blurry vision, seeing halos around lights, vision loss Thoughts of suicide or self-harm, worsening mood, feelings of depression Side effects that usually do not require medical attention (report to your care team if they continue or are bothersome): Constipation Dizziness Drowsiness Dry mouth Increase in appetite Weight gain This list may not describe all possible side effects. Call your doctor for medical advice about side effects. You may report side effects to FDA at 1-800-FDA-1088. Where should  I keep my medication? Keep out of the reach of children. Store at room temperature between 15 and 30 degrees C (59 and 86 degrees F) Protect from light and moisture. Throw away any unused medication after the expiration date. NOTE: This sheet is a summary. It may not cover all possible information. If you have questions about this medicine, talk to your doctor, pharmacist, or health care provider.  2022 Elsevier/Gold Standard (2020-07-13 00:00:00)

## 2021-05-23 NOTE — Progress Notes (Signed)
[facility] MD OP Progress Note  05/23/2021 2:14 PM Traci Davis  MRN:  [id number]  Chief Complaint:  Chief Complaint   Follow-up: 51 year old Caucasian female who has a history of MDD, GAD, insomnia, multiple medical problems including cerebral palsy, diabetes mellitus presents with worsening sleep problems for medication management.    HPI: Traci Davis is a 51 year old Caucasian female, lives in [place], on [place], has a history of MDD, GAD, insomnia, cerebral palsy, diabetes mellitus was evaluated in office today.  Patient today reports she is currently struggling with sleep problems.  She is currently taking melatonin 40 mg and that helps her to sleep.  She reports lower than that dosage of melatonin did not work.  Patient does report anxiety about her boyfriend who is currently struggling with lung cancer.  She however reports she has been able to cope with that okay.  Patient reports she has been more active recently and has lost weight.  She is eating healthy.  Denies any significant sadness, crying spells.  Reports pain is more under control since she had recent medication dosage readjustment by her primary pain provider.  Patient denies any suicidality, homicidality or perceptual disturbances.  Patient denies any other concerns today.  Visit Diagnosis:    ICD-10-CM   1. MDD (major depressive disorder), recurrent, in full remission (Traci Davis)  F33.42 buPROPion (WELLBUTRIN) 100 MG tablet    2. GAD (generalized anxiety disorder)  F41.1 FLUoxetine (PROZAC) 40 MG capsule    3. Insomnia due to medical condition  G47.01 mirtazapine (REMERON) 7.5 MG tablet   pain, mood      Past Psychiatric History: Reviewed past psychiatric history from progress note on 09/12/2017.  Past trials of Xanax, Klonopin, Cymbalta, Rozerem, Ambien, trazodone, doxepin.  Past Medical History:  Past Medical History:  Diagnosis Date   Anxiety    Arthritis    Asthma    Cerebral palsy ([place])    Complication of  anesthesia    panic attacks before surgery   COPD (chronic obstructive pulmonary disease) ([place])    Depression    Diabetes mellitus without complication ([place])    GERD (gastroesophageal reflux disease)    Hypertension     Past Surgical History:  Procedure Laterality Date   CESAREAN SECTION  1995   CHOLECYSTECTOMY     DILATATION & CURETTAGE/HYSTEROSCOPY WITH MYOSURE N/A 02/20/2018   Procedure: DILATATION & CURETTAGE/HYSTEROSCOPY WITH MYOSURE ENDOMETRIAL POLYPECTOMY;  Surgeon: Will Bonnet, MD;  Location: [facility];  Service: Gynecology;  Laterality: N/A;   ESOPHAGOGASTRODUODENOSCOPY (EGD) WITH PROPOFOL N/A 01/01/2019   Procedure: ESOPHAGOGASTRODUODENOSCOPY (EGD) WITH PROPOFOL;  Surgeon: Lin Landsman, MD;  Location: [facility];  Service: Gastroenterology;  Laterality: N/A;   FOOT CAPSULE RELEASE W/ PERCUTANEOUS HEEL CORD LENGTHENING, TIBIAL TENDON TRANSFER     age 62 ,and 2010-both legs   JOINT REPLACEMENT Right    both knees partial replacements dates unknown   knee replacment     x 5 per pt. last one- left knee 2014. has had 2 on R 3 on L   TYMPANOPLASTY WITH GRAFT Right 01/08/2018   Procedure: TYMPANOPLASTY WITH POSSIBLE OSSICULAR CHAIN RECONSTRUCTION;  Surgeon: [name], MD;  Location: [facility];  Service: ENT;  Laterality: Right;    Family Psychiatric History: Reviewed family psychiatric history from progress note on 09/12/2017.  Family History:  Family History  Problem Relation Age of Onset   CAD Father    Heart attack Brother    Sexual abuse Paternal Grandfather  Breast cancer Neg Hx     Social History: Reviewed social history from progress note on 09/12/2017. Social History   Socioeconomic History   Marital status: Single    Spouse name: Not on file   Number of children: 1   Years of education: Not on file   Highest education level: Associate degree: occupational, Hotel manager, or vocational program  Occupational History   Not on file  Tobacco Use    Smoking status: Never   Smokeless tobacco: Never  Vaping Use   Vaping Use: Never used  Substance and Sexual Activity   Alcohol use: No   Drug use: No   Sexual activity: Yes    Partners: Male    Birth control/protection: Condom  Other Topics Concern   Not on file  Social History Narrative   Not on file   Social Determinants of Health   Financial Resource Strain: Not on file  Food Insecurity: Not on file  Transportation Needs: Not on file  Physical Activity: Not on file  Stress: Not on file  Social Connections: Not on file    Allergies:  Allergies  Allergen Reactions   Meloxicam Other (See Comments)    Damage kidney   Morphine And Related Other (See Comments)    hallucinations   Vantin [Cefpodoxime] Nausea And Vomiting   Latex Itching   Baclofen Other (See Comments)    "makes cerebral palsy do adverse reaction on me", tightens muscles    Ciprofloxacin Itching and Nausea And Vomiting   Tape Rash    skin tears.  Paper tape is ok   Tramadol Nausea Only    Metabolic Disorder Labs: Lab Results  Component Value Date   HGBA1C 6.1 (H) 02/19/2015   No results found for: PROLACTIN No results found for: CHOL, TRIG, HDL, CHOLHDL, VLDL, LDLCALC Lab Results  Component Value Date   TSH 2.198 07/10/2016   TSH 2.891 02/19/2015    Therapeutic Level Labs: No results found for: LITHIUM No results found for: VALPROATE No components found for:  CBMZ  Current Medications: Current Outpatient Medications  Medication Sig Dispense Refill   cyclobenzaprine (FLEXERIL) 10 MG tablet Take by mouth.     diazepam (VALIUM) 5 MG tablet Take by mouth.     diclofenac Sodium (VOLTAREN) 1 % GEL Apply topically.     dicyclomine (BENTYL) 10 MG capsule Take by mouth.     fluticasone (FLONASE) 50 MCG/ACT nasal spray 1 spray into each nostril two (2) times a day.     lidocaine (LIDODERM) 5 % Place onto the skin.     mirtazapine (REMERON) 7.5 MG tablet Take 1 tablet (7.5 mg total) by mouth at  bedtime as needed. sleep 30 tablet 1   oxyCODONE-acetaminophen (PERCOCET/ROXICET) 5-325 MG tablet Take by mouth.     acetaminophen (TYLENOL) 325 MG tablet Take 2 tablets (650 mg total) by mouth every 6 (six) hours as needed. 60 tablet 0   albuterol (PROVENTIL HFA;VENTOLIN HFA) 108 (90 BASE) MCG/ACT inhaler Inhale 2 puffs into the lungs 4 (four) times daily as needed. For shortness of breath and/or wheezing     ANORO ELLIPTA 62.5-25 MCG/INH AEPB      atorvastatin (LIPITOR) 20 MG tablet Take 1 tablet by mouth daily.     Blood Glucose Monitoring Suppl (GLUCOCOM BLOOD GLUCOSE MONITOR) DEVI Test daily before all meals/snacks and once before bedtime.     buPROPion (WELLBUTRIN) 100 MG tablet Take 1 tablet (100 mg total) by mouth every morning. 90 tablet 1  chlorzoxazone (PARAFON) 500 MG tablet Take 500 mg by mouth 3 (three) times daily.     cloNIDine (CATAPRES - DOSED IN MG/24 HR) 0.1 mg/24hr patch Place 1 patch (0.1 mg total) onto the skin once a week. 4 patch 11   CREON 36000 units CPEP capsule      cyclobenzaprine (FLEXERIL) 10 MG tablet Take by mouth.     diazepam (VALIUM) 5 MG tablet Take by mouth.     diclofenac Sodium (VOLTAREN) 1 % GEL Apply 1 application topically 4 (four) times daily.     dicyclomine (BENTYL) 10 MG capsule Take 10 mg by mouth as needed.      donepezil (ARICEPT) 10 MG tablet Take by mouth.     fluconazole (DIFLUCAN) 150 MG tablet Take 150 mg by mouth once.     FLUoxetine (PROZAC) 40 MG capsule Take 1 capsule (40 mg total) by mouth daily. Dose change 90 capsule 1   fluticasone (FLONASE) 50 MCG/ACT nasal spray 1 spray into each nostril two (2) times a day.     furosemide (LASIX) 40 MG tablet Take 40 mg by mouth daily as needed for fluid.      glimepiride (AMARYL) 4 MG tablet Take 4 mg by mouth daily with breakfast.     GLOBAL EASE INJECT PEN NEEDLES 31G X 5 MM MISC Inject into the skin.     glucose blood test strip USE TO TEST BLOOD SUGAR TWO TIMES A DAY     HYDROmorphone  (DILAUDID) 2 MG tablet Take 2 mg by mouth 4 (four) times daily.     incobotulinumtoxinA (XEOMIN) 100 units SOLR injection Inject 100 Units into the muscle every 3 (three) months.      insulin glargine (LANTUS) 100 UNIT/ML Solostar Pen Inject into the skin.     Insulin Pen Needle (PEN NEEDLES 31GX5/16") 31G X 8 MM MISC Injection Frequency is 1 time per day; Dx Code: Type 2 Diabetes uncontrolled (E11.65)     Lancets (FREESTYLE) lancets Test daily before all meals/snacks     LANTUS SOLOSTAR 100 UNIT/ML Solostar Pen Inject into the skin.     lidocaine (LIDODERM) 5 % Place onto the skin.     LIDOPRO 4-4-5 % PTCH 1 patch patch every twelve hours     LINZESS 145 MCG CAPS capsule Take 1 capsule by mouth every morning  for constipation     Melatonin 10 MG TBCR Take by mouth.     metFORMIN (GLUCOPHAGE-XR) 500 MG 24 hr tablet Take by mouth.     metFORMIN (GLUCOPHAGE-XR) 500 MG 24 hr tablet Take by mouth.     mirabegron ER (MYRBETRIQ) 25 MG TB24 tablet Take 1 tablet by mouth daily.     naloxone (NARCAN) nasal spray 4 mg/0.1 mL One spray in either nostril once for known/suspected opioid overdose. May repeat every 2-3 minutes in alternating nostril til EMS arrives     nystatin (MYCOSTATIN/NYSTOP) powder Apply topically.     omeprazole (PRILOSEC) 40 MG capsule Take by mouth.     ondansetron (ZOFRAN) 4 MG tablet      oxyCODONE (OXY IR/ROXICODONE) 5 MG immediate release tablet Take 5 mg by mouth 4 (four) times daily as needed.     oxyCODONE ER (XTAMPZA ER) 9 MG C12A Take 9 mg by mouth daily.     PAXLOVID, 300/100, 20 x 150 MG & 10 x 100MG  TBPK SMARTSIG:3 By Mouth Twice Daily     predniSONE (DELTASONE) 20 MG tablet Take 20 mg by mouth 2 (two)  times daily.     promethazine (PHENERGAN) 25 MG tablet Take by mouth.     solifenacin (VESICARE) 10 MG tablet Take 10 mg by mouth daily.     XTAMPZA ER 13.5 MG C12A Take by mouth daily as needed.     No current facility-administered medications for this visit.      Musculoskeletal: Strength & Muscle Tone:  baseline , has a history of cerebral palsy Gait & Station:  uses walker Patient leans: Front  Psychiatric Specialty Exam: Review of Systems  Psychiatric/Behavioral:  Positive for sleep disturbance.   All other systems reviewed and are negative.  Blood pressure (!) 165/91, pulse 97, temperature (!) 97.3 F (36.3 C), temperature source Temporal, weight 195 lb 6.4 oz (88.6 kg), last menstrual period 08/20/2014.Body mass index is 35.74 kg/m.  General Appearance: Casual  Eye Contact:  Fair  Speech:  Clear and Coherent  Volume:  Normal  Mood:  Anxious  Affect:  Congruent  Thought Process:  Goal Directed and Descriptions of Associations: Intact  Orientation:  Full (Time, Place, and Person)  Thought Content: Logical   Suicidal Thoughts:  No  Homicidal Thoughts:  No  Memory:  Immediate;   Fair Recent;   Fair Remote;   Fair  Judgement:  Fair  Insight:  Fair  Psychomotor Activity:  Normal  Concentration:  Concentration: Fair and Attention Span: Fair  Recall:  [employer] of Knowledge: Fair  Language: Fair  Akathisia:  No  Handed:  Right  AIMS (if indicated): done,0  Assets:  Communication Skills Desire for Improvement Housing Social Support  ADL's:  Intact  Cognition: WNL  Sleep:  Poor   Screenings: Camera operator Row Office Visit from 05/23/2021 in [place] Office Visit from 08/30/2020 in [facility] Video Visit from 06/30/2020 in [facility] Video Visit from 05/11/2020 in [facility] [place]  PHQ-2 Total Score 0 0 0 4  PHQ-9 Total Score -- -- -- 14      [place] Visit from 08/30/2020 in [place] Video Visit from 06/30/2020 in [place] Error: Question 6 not populated No Risk        Assessment and Plan: SHAKEMIA MADERA  is a 51 year old Caucasian female who has a history of depression, GAD, chronic pain was evaluated in office today.  Patient is currently struggling with sleep, will benefit from the following plan.  Plan MDD in remission We will reduce Prozac to 40 mg p.o. daily Wellbutrin 100 mg p.o. daily   GAD-stable Prozac 40 mg p.o. daily  Insomnia-unstable Patient advised to reduce the dosage of melatonin since she is on a higher dosage. We will start mirtazapine 7.5 mg p.o. nightly as needed Provided medication education.  She will continue to need sufficient pain management.  Patient with elevated blood pressure reading in session, advised to follow up with primary care provider.  Follow-up in clinic in 2 weeks or sooner.  This note was generated in part or whole with voice recognition software. Voice recognition is usually quite accurate but there are transcription errors that can and very often do occur. I apologize for any typographical errors that were not detected and corrected.      Ursula Alert, MD 05/24/2021, 8:28 AM

## 2021-05-24 ENCOUNTER — Ambulatory Visit: Payer: Medicare Other

## 2021-05-24 DIAGNOSIS — R262 Difficulty in walking, not elsewhere classified: Secondary | ICD-10-CM

## 2021-05-24 DIAGNOSIS — M25561 Pain in right knee: Secondary | ICD-10-CM | POA: Diagnosis not present

## 2021-05-24 DIAGNOSIS — G8929 Other chronic pain: Secondary | ICD-10-CM

## 2021-05-24 DIAGNOSIS — M6281 Muscle weakness (generalized): Secondary | ICD-10-CM

## 2021-05-24 NOTE — Therapy (Signed)
[facility] [address] [place], [address] Phone: [phone]   Fax:  [phone]  Physical Therapy Treatment  Patient Details  Name: Traci Davis MRN: [id number] Date of Birth: 1970-10-12 Referring Provider (PT): Vonna Kotyk, MD   Encounter Date: 05/24/2021   PT End of Session - 05/24/21 1328     Visit Number 3    Number of Visits 17    Date for PT Re-Evaluation 07/06/21    PT Start Time 6962    PT Stop Time 9528    PT Time Calculation (min) 48 min    Equipment Utilized During Treatment Other (comment)   rw, L KAFO   Activity Tolerance Patient tolerated treatment well    Behavior During Therapy [facility] for tasks assessed/performed             Past Medical History:  Diagnosis Date   Anxiety    Arthritis    Asthma    Cerebral palsy ([place])    Complication of anesthesia    panic attacks before surgery   COPD (chronic obstructive pulmonary disease) ([place])    Depression    Diabetes mellitus without complication ([place])    GERD (gastroesophageal reflux disease)    Hypertension     Past Surgical History:  Procedure Laterality Date   [place] N/A 02/20/2018   Procedure: DILATATION & CURETTAGE/HYSTEROSCOPY WITH MYOSURE ENDOMETRIAL POLYPECTOMY;  Surgeon: Will Bonnet, MD;  Location: [facility];  Service: Gynecology;  Laterality: N/A;   ESOPHAGOGASTRODUODENOSCOPY (EGD) WITH PROPOFOL N/A 01/01/2019   Procedure: ESOPHAGOGASTRODUODENOSCOPY (EGD) WITH PROPOFOL;  Surgeon: Lin Landsman, MD;  Location: [facility];  Service: Gastroenterology;  Laterality: N/A;   FOOT CAPSULE RELEASE W/ PERCUTANEOUS HEEL CORD LENGTHENING, TIBIAL TENDON TRANSFER     age 51 ,and 2010-both legs   JOINT REPLACEMENT Right    both knees partial replacements dates unknown   knee replacment     x 5 per pt. last one- left knee  2014. has had 2 on R 3 on L   TYMPANOPLASTY WITH GRAFT Right 01/08/2018   Procedure: TYMPANOPLASTY WITH POSSIBLE OSSICULAR CHAIN RECONSTRUCTION;  Surgeon: [name], MD;  Location: [facility];  Service: ENT;  Laterality: Right;    There were no vitals filed for this visit.   Subjective Assessment - 05/24/21 1326     Subjective Pt reporting 6/10 NPS in R knee, reports this is still lower pain levels than she has been having since beginning PT. Reports compliance with HEP.    Pertinent History R lateral knee pain. Was told that her pain was due to her Iliotibial band. Has R lateral knee and lateral patellar soreness. R LE is the stronger leg due to cerebral palsy L LE. L LE needs KAFO. R knee feels like it buckles at times when she first gets up off the bed. R knee cap feels like it pops up. Pt has to straighten out her leg a little prior to stand up from sitting (standing up from a 90 degree knee flexed position makes her knee buckle. Knee pain has been occuring almost a year, at least 9 months.Had home health PT for 3 visits which helped decrease R knee pain. Falls is a regular occurrence per pt. Pt states R lateral hip has been bothering her as well.    Limitations Lifting;House hold activities;Other (comment);Walking;Standing   sleeping  Patient Stated Goals Decrease pain. Wants to get back to walking outside.    Currently in Pain? Yes    Pain Score 6     Pain Location Knee    Pain Orientation Right    Pain Onset More than a month ago   October 2018           Therapeutic exercise  Seated hip flexion, emphasis on minimal to no trunk lateral lean compensation, use of UE's for supporting upright posture              R 2x10             L 2x10   Seated knee extension              R and L 2x10 with 5 second holds. AAROM provided for LLE due to limited strength   Seated Hip ER clamshell: 3x10, PT demo and cuing for form/technique  Supine RLE heel slide on L shin for hip flexion, Abd,  ER strengthening: 3x10, TC's on knee and ankle for motion of exericse  Supine modified figure 4 stretch for lateral hip/glut stretch: 3x30 sec  Hook lying bridge with L KAFO donned. Pillow placed under sacrum for pt comfort: 2x10, good form/technique       Manual therapy  Seated STM R lateral hamstrings, vastus lateralis to decrease fascial restrictions and muscle tension. 10 minutes total. Pt endorsing improvement in pain score but does not give number. Displaying improved RLE stance time and step length post treatment.      Accompanied pt from clinic to car for safety due to fall risk. Difficulty advancing L LE secondary to L knee locked in extension secondary to quad weakness as well as no L ankle DF observed for foot clearance during swing phase.    PT Education - 05/24/21 1328     Education Details form/technique with exercise.    Person(s) Educated Patient    Methods Explanation;Demonstration;Tactile cues;Verbal cues    Comprehension Verbalized understanding;Returned demonstration              PT Short Term Goals - 05/10/21 1746       PT SHORT TERM GOAL #1   Title Pt will be independent with her initial HEP to decrease pain, improve strength and ability to perform standing tasks more comfortably.    Baseline Pt has not yet started her HEP (05/10/2021)    Time 3    Period Weeks    Status New    Target Date 06/01/21               PT Long Term Goals - 05/10/21 1751       PT LONG TERM GOAL #1   Title Pt will have a decrease in R knee pain to 5/10 or less at worst to promote ability to ambulate, as well as perform standing tasks more comfortably.    Baseline 9/10 R knee pain at worst for the past 3 months (05/10/2021)    Time 8    Period Weeks    Status New    Target Date 07/06/21      PT LONG TERM GOAL #2   Title Pt will improve bilateral hip flexion, extension, abduction and R hip ER strength by at least 1/2 MMT grade to promote ability to ambulate with less  difficulty, and more comfortably for her R knee.    Baseline Hip flexion 4-/5 R, 3-/5 L, hip extension 4/5 R 3/5 L, hip abduction 4-/5  R, 3+/5 L, R hip ER 4-/5 (05/10/2021)    Time 8    Period Weeks    Status New    Target Date 07/06/21      PT LONG TERM GOAL #3   Title Pt will improve her R knee FOTO score by at least 10 points as a demonstration of improved function.    Baseline R knee FOTO 41 (05/10/2021)    Time 8    Period Weeks    Status New    Target Date 07/06/21                   Plan - 05/24/21 1416     Clinical Impression Statement Pt reporting R knee pain is less than initial eval. Utilization of manual techniques and therapeutic exercise to improve hip/knee pain and hip mobility. Pt tolerating more exercise today in hip extension/abduction/ER without increase in symptoms post session. Pre session to post session pt subjectively reports improvement in pain and displays improved gait mechanics with improved stance time on RLE and step length bilaterally. Pt will continue to benefit from skilled PT services to further decrease pain and improve strength/function.    Personal Factors and Comorbidities Comorbidity 3+;Finances;Education;Fitness;Past/Current Experience;Social Background;Time since onset of injury/illness/exacerbation;Transportation    Comorbidities Anxiety, hx of falls, cerebral palsy, depression, DM, HTN    Examination-Activity Limitations Squat;Stairs;Locomotion Level;Stand;Transfers;Carry    Stability/Clinical Decision Making Stable/Uncomplicated    Rehab Potential Fair    PT Frequency 2x / week    PT Duration 8 weeks    PT Treatment/Interventions Therapeutic activities;Therapeutic exercise;Balance training;Electrical Stimulation;Iontophoresis 4mg /ml Dexamethasone;Gait training;Neuromuscular re-education;Patient/family education;Manual techniques;Dry needling    PT Next Visit Plan trunk, hip and knee strengthening, femoral control, manual techniques,  modalities PRN    Consulted and Agree with Plan of Care Patient             Patient will benefit from skilled therapeutic intervention in order to improve the following deficits and impairments:  Pain, Postural dysfunction, Improper body mechanics, Difficulty walking, Decreased strength, Decreased endurance, Decreased balance, Decreased activity tolerance, Abnormal gait  Visit Diagnosis: Chronic pain of right knee  Muscle weakness (generalized)  Difficulty in walking, not elsewhere classified     Problem List Patient Active Problem List   Diagnosis Date Noted   No-show for appointment 12/22/2020   Obesity due to excess calories 09/22/2020   Nausea 09/20/2020   Sinusitis 08/09/2020   Insomnia due to medical condition 05/11/2020   COPD (chronic obstructive pulmonary disease) ([place]) 03/22/2020   Hamstring tendonitis 01/29/2020   MDD (major depressive disorder), recurrent, in full remission ([place]) 08/12/2019   Seizure-like activity ([place]) 07/08/2019   Mild episode of recurrent major depressive disorder ([place]) 12/29/2018   Insomnia due to mental condition 12/29/2018   Disorder of vein 03/04/2018   Lumbar sprain 03/04/2018   Muscle weakness 03/04/2018   Neck sprain 03/04/2018   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   Postmenopausal bleeding 02/18/2018   Impingement syndrome of shoulder region 02/04/2018   Chest pain with moderate risk for cardiac etiology 05/19/2017   History of depression 04/15/2017   GAD (generalized anxiety disorder) 04/15/2017   Chronic daily headache 04/15/2017   Panic attack 04/15/2017   Nocturnal enuresis 04/15/2017   Mild cognitive impairment 04/15/2017   Loss of memory 01/14/2017   Pain medication agreement signed 01/08/2017   Headache disorder 12/04/2016   Chronic, continuous use of opioids 07/01/2015   Vertigo 07/01/2015   Chronic midline low back pain without sciatica 03/21/2015  Acute renal failure (ARF) ([place]) 02/19/2015   Type 2 diabetes mellitus  without complication ([place]) 02/72/5366   Acute pancreatitis 09/04/2014   Cerebral palsy ([place]) 01/05/2014   Cerebral palsy with spastic/ataxic diplegia 01/05/2014   Hip pain, chronic 04/28/2013   Essential hypertension 01/10/2013   Irritable colon 01/10/2013   Noninfectious gastroenteritis and colitis 01/10/2013   Encounter for long-term (current) use of other medications 12/04/2012   Chronic GERD 08/12/2012   Epigastric pain 08/12/2012   Diarrhea 08/12/2012   Primary localized osteoarthrosis, lower leg 05/07/2012   Difficulty walking 02/06/2012    Salem Caster. Fairly IV, PT, DPT Physical Therapist- [facility]  05/24/2021, 2:24 PM  [employer] [facility] [address], [place], [address] Phone: [phone]   Fax:  [phone]  Name: KENLEE VOGT MRN: [id number] Date of Birth: 1970-11-10

## 2021-05-29 ENCOUNTER — Ambulatory Visit: Payer: Medicare Other

## 2021-05-29 ENCOUNTER — Other Ambulatory Visit: Payer: Self-pay

## 2021-05-29 DIAGNOSIS — M6281 Muscle weakness (generalized): Secondary | ICD-10-CM

## 2021-05-29 DIAGNOSIS — R262 Difficulty in walking, not elsewhere classified: Secondary | ICD-10-CM

## 2021-05-29 DIAGNOSIS — G8929 Other chronic pain: Secondary | ICD-10-CM

## 2021-05-29 DIAGNOSIS — M25561 Pain in right knee: Secondary | ICD-10-CM | POA: Diagnosis not present

## 2021-05-29 NOTE — Therapy (Signed)
[facility] [address] [place], [address] Phone: [phone]   Fax:  [phone]  Physical Therapy Treatment  Patient Details  Name: Traci Davis MRN: [id number] Date of Birth: 03-04-1971 Referring Provider (PT): Vonna Kotyk, MD   Encounter Date: 05/29/2021   PT End of Session - 05/29/21 1334     Visit Number 4    Number of Visits 17    Date for PT Re-Evaluation 07/06/21    PT Start Time 8250    PT Stop Time 5397    PT Time Calculation (min) 49 min    Equipment Utilized During Treatment Other (comment)   rw, L KAFO   Activity Tolerance Patient tolerated treatment well    Behavior During Therapy [facility] for tasks assessed/performed             Past Medical History:  Diagnosis Date   Anxiety    Arthritis    Asthma    Cerebral palsy ([place])    Complication of anesthesia    panic attacks before surgery   COPD (chronic obstructive pulmonary disease) ([place])    Depression    Diabetes mellitus without complication ([place])    GERD (gastroesophageal reflux disease)    Hypertension     Past Surgical History:  Procedure Laterality Date   [place] N/A 02/20/2018   Procedure: DILATATION & CURETTAGE/HYSTEROSCOPY WITH MYOSURE ENDOMETRIAL POLYPECTOMY;  Surgeon: Will Bonnet, MD;  Location: [facility];  Service: Gynecology;  Laterality: N/A;   ESOPHAGOGASTRODUODENOSCOPY (EGD) WITH PROPOFOL N/A 01/01/2019   Procedure: ESOPHAGOGASTRODUODENOSCOPY (EGD) WITH PROPOFOL;  Surgeon: Lin Landsman, MD;  Location: [place] [facility];  Service: Gastroenterology;  Laterality: N/A;   FOOT CAPSULE RELEASE W/ PERCUTANEOUS HEEL CORD LENGTHENING, TIBIAL TENDON TRANSFER     age 51 ,and 2010-both legs   JOINT REPLACEMENT Right    both knees partial replacements dates unknown   knee replacment     x 5 per pt. last one- left knee  2014. has had 2 on R 3 on L   TYMPANOPLASTY WITH GRAFT Right 01/08/2018   Procedure: TYMPANOPLASTY WITH POSSIBLE OSSICULAR CHAIN RECONSTRUCTION;  Surgeon: [name], MD;  Location: [facility];  Service: ENT;  Laterality: Right;    There were no vitals filed for this visit.   Subjective Assessment - 05/29/21 1332     Subjective Pt reporting 6/10 NPS in R knee. Had some pain and soreness day after last session. Reports pain relief for a few hours after last session.    Pertinent History R lateral knee pain. Was told that her pain was due to her Iliotibial band. Has R lateral knee and lateral patellar soreness. R LE is the stronger leg due to cerebral palsy L LE. L LE needs KAFO. R knee feels like it buckles at times when she first gets up off the bed. R knee cap feels like it pops up. Pt has to straighten out her leg a little prior to stand up from sitting (standing up from a 90 degree knee flexed position makes her knee buckle. Knee pain has been occuring almost a year, at least 9 months.Had home health PT for 3 visits which helped decrease R knee pain. Falls is a regular occurrence per pt. Pt states R lateral hip has been bothering her as well.    Limitations Lifting;House hold activities;Other (comment);Walking;Standing   sleeping  Patient Stated Goals Decrease pain. Wants to get back to walking outside.    Currently in Pain? Yes    Pain Score 6     Pain Location Knee    Pain Orientation Right    Pain Onset More than a month ago   October 2018           There.ex:   Seated hip flexion, emphasis on minimal to no trunk lateral lean compensation, use of UE's for supporting upright posture               R 2x10              L 2x10  Seated hamstring stretch on R knee: 4x20 sec. Mod, multimodal cuing for form/technique to maintain knee extension and L heel on ground.   Seated R knee flexion/extension AROM on med ball for R knee mobility: x20/direction   Seated Hip ER bilat: 1x12, no  resistance. 2x8, YTB. KAFO doffed independently.  Hook lying exercises [place]: 2x10, good form/technique. Pillow placed under sacrum for pt comfort. Cuing to prevent lumbar lordosis as compensation for weak gluts. Subjective reports of improvement in LBP with modification.  R sided figure 4 stretch. Set up assist required to maintain position: 1x2 min hold, 1x 30 sec hold.   R heel slide up L shin for R hip mobility/strength: x12 with light AAROM for form/technique    PT Education - 05/29/21 1334     Education Details form/technique with exercise    Person(s) Educated Patient    Methods Explanation;Demonstration;Tactile cues;Verbal cues    Comprehension Verbalized understanding;Returned demonstration              PT Short Term Goals - 05/10/21 1746       PT SHORT TERM GOAL #1   Title Pt will be independent with her initial HEP to decrease pain, improve strength and ability to perform standing tasks more comfortably.    Baseline Pt has not yet started her HEP (05/10/2021)    Time 3    Period Weeks    Status New    Target Date 06/01/21               PT Long Term Goals - 05/10/21 1751       PT LONG TERM GOAL #1   Title Pt will have a decrease in R knee pain to 5/10 or less at worst to promote ability to ambulate, as well as perform standing tasks more comfortably.    Baseline 9/10 R knee pain at worst for the past 3 months (05/10/2021)    Time 8    Period Weeks    Status New    Target Date 07/06/21      PT LONG TERM GOAL #2   Title Pt will improve bilateral hip flexion, extension, abduction and R hip ER strength by at least 1/2 MMT grade to promote ability to ambulate with less difficulty, and more comfortably for her R knee.    Baseline Hip flexion 4-/5 R, 3-/5 L, hip extension 4/5 R 3/5 L, hip abduction 4-/5 R, 3+/5 L, R hip ER 4-/5 (05/10/2021)    Time 8    Period Weeks    Status New    Target Date 07/06/21      PT LONG TERM GOAL #3   Title Pt will improve her  R knee FOTO score by at least 10 points as a demonstration of improved function.    Baseline R knee FOTO 41 (  05/10/2021)    Time 8    Period Weeks    Status New    Target Date 07/06/21                   Plan - 05/29/21 1422     Clinical Impression Statement Continuing with progrssive strength, hip/knee mobility to improve pain and function. Pt responding well to improving R knee hamstring length and proximal hip tightness in today's session. Pt reporting from 6/10 pain post session to no pain in R knee post session with improved stance time and step length on RLE but still having limited foot clearance throughout. Pt will continue to benefit from skilled PT services to further decrease pain and improve strength/function.    Personal Factors and Comorbidities Comorbidity 3+;Finances;Education;Fitness;Past/Current Experience;Social Background;Time since onset of injury/illness/exacerbation;Transportation    Comorbidities Anxiety, hx of falls, cerebral palsy, depression, DM, HTN    Examination-Activity Limitations Squat;Stairs;Locomotion Level;Stand;Transfers;Carry    Stability/Clinical Decision Making Stable/Uncomplicated    Rehab Potential Fair    PT Frequency 2x / week    PT Duration 8 weeks    PT Treatment/Interventions Therapeutic activities;Therapeutic exercise;Balance training;Electrical Stimulation;Iontophoresis 4mg /ml Dexamethasone;Gait training;Neuromuscular re-education;Patient/family education;Manual techniques;Dry needling    PT Next Visit Plan trunk, hip and knee strengthening, femoral control, manual techniques, modalities PRN    Consulted and Agree with Plan of Care Patient             Patient will benefit from skilled therapeutic intervention in order to improve the following deficits and impairments:  Pain, Postural dysfunction, Improper body mechanics, Difficulty walking, Decreased strength, Decreased endurance, Decreased balance, Decreased activity tolerance,  Abnormal gait  Visit Diagnosis: Chronic pain of right knee  Muscle weakness (generalized)  Difficulty in walking, not elsewhere classified     Problem List Patient Active Problem List   Diagnosis Date Noted   No-show for appointment 12/22/2020   Obesity due to excess calories 09/22/2020   Nausea 09/20/2020   Sinusitis 08/09/2020   Insomnia due to medical condition 05/11/2020   COPD (chronic obstructive pulmonary disease) ([place]) 03/22/2020   Hamstring tendonitis 01/29/2020   MDD (major depressive disorder), recurrent, in full remission ([place]) 08/12/2019   Seizure-like activity ([place]) 07/08/2019   Mild episode of recurrent major depressive disorder ([place]) 12/29/2018   Insomnia due to mental condition 12/29/2018   Disorder of vein 03/04/2018   Lumbar sprain 03/04/2018   Muscle weakness 03/04/2018   Neck sprain 03/04/2018   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   Postmenopausal bleeding 02/18/2018   Impingement syndrome of shoulder region 02/04/2018   Chest pain with moderate risk for cardiac etiology 05/19/2017   History of depression 04/15/2017   GAD (generalized anxiety disorder) 04/15/2017   Chronic daily headache 04/15/2017   Panic attack 04/15/2017   Nocturnal enuresis 04/15/2017   Mild cognitive impairment 04/15/2017   Loss of memory 01/14/2017   Pain medication agreement signed 01/08/2017   Headache disorder 12/04/2016   Chronic, continuous use of opioids 07/01/2015   Vertigo 07/01/2015   Chronic midline low back pain without sciatica 03/21/2015   Acute renal failure (ARF) ([place]) 02/19/2015   Type 2 diabetes mellitus without complication ([place]) 93/57/0177   Acute pancreatitis 09/04/2014   Cerebral palsy ([place]) 01/05/2014   Cerebral palsy with spastic/ataxic diplegia 01/05/2014   Hip pain, chronic 04/28/2013   Essential hypertension 01/10/2013   Irritable colon 01/10/2013   Noninfectious gastroenteritis and colitis 01/10/2013   Encounter for long-term (current) use of other  medications 12/04/2012   Chronic GERD  08/12/2012   Epigastric pain 08/12/2012   Diarrhea 08/12/2012   Primary localized osteoarthrosis, lower leg 05/07/2012   Difficulty walking 02/06/2012    Salem Caster. Fairly IV, PT, DPT Physical Therapist- [facility]  05/29/2021, 2:28 PM  [facility] [address] [place], [address] Phone: [phone]   Fax:  [phone]  Name: Traci Davis MRN: [id number] Date of Birth: 15-Apr-1971

## 2021-06-01 ENCOUNTER — Ambulatory Visit: Payer: Medicare Other | Attending: Orthopaedic Surgery

## 2021-06-01 ENCOUNTER — Telehealth: Payer: Self-pay | Admitting: Psychiatry

## 2021-06-01 ENCOUNTER — Other Ambulatory Visit: Payer: Self-pay

## 2021-06-01 DIAGNOSIS — F3342 Major depressive disorder, recurrent, in full remission: Secondary | ICD-10-CM

## 2021-06-01 DIAGNOSIS — M25561 Pain in right knee: Secondary | ICD-10-CM | POA: Insufficient documentation

## 2021-06-01 DIAGNOSIS — G8929 Other chronic pain: Secondary | ICD-10-CM | POA: Insufficient documentation

## 2021-06-01 DIAGNOSIS — M6281 Muscle weakness (generalized): Secondary | ICD-10-CM | POA: Insufficient documentation

## 2021-06-01 DIAGNOSIS — R262 Difficulty in walking, not elsewhere classified: Secondary | ICD-10-CM | POA: Insufficient documentation

## 2021-06-01 MED ORDER — BUPROPION HCL 100 MG PO TABS: 100.0000 mg | ORAL_TABLET | Freq: Every morning | ORAL | 1 refills | Status: DC

## 2021-06-01 NOTE — Telephone Encounter (Signed)
Have sent Wellbutrin 100 mg again to pharmacy with instructions.

## 2021-06-01 NOTE — Therapy (Signed)
[facility] [address] [place], [address] Phone: [phone]   Fax:  [phone]  Physical Therapy Treatment  Patient Details  Name: Traci Davis MRN: [id number] Date of Birth: [birth year] Referring Provider (PT): Vonna Kotyk, MD   Encounter Date: 06/01/2021   PT End of Session - 06/01/21 1549     Visit Number 5    Number of Visits 17    Date for PT Re-Evaluation 07/06/21    PT Start Time 1093    PT Stop Time 66    PT Time Calculation (min) 45 min    Equipment Utilized During Treatment Other (comment)   rw, L KAFO   Activity Tolerance Patient tolerated treatment well    Behavior During Therapy [facility] for tasks assessed/performed             Past Medical History:  Diagnosis Date   Anxiety    Arthritis    Asthma    Cerebral palsy ([place])    Complication of anesthesia    panic attacks before surgery   COPD (chronic obstructive pulmonary disease) ([place])    Depression    Diabetes mellitus without complication ([place])    GERD (gastroesophageal reflux disease)    Hypertension     Past Surgical History:  Procedure Laterality Date   [place] N/A 02/20/2018   Procedure: DILATATION & CURETTAGE/HYSTEROSCOPY WITH MYOSURE ENDOMETRIAL POLYPECTOMY;  Surgeon: Will Bonnet, MD;  Location: [facility];  Service: Gynecology;  Laterality: N/A;   ESOPHAGOGASTRODUODENOSCOPY (EGD) WITH PROPOFOL N/A 01/01/2019   Procedure: ESOPHAGOGASTRODUODENOSCOPY (EGD) WITH PROPOFOL;  Surgeon: Lin Landsman, MD;  Location: [place];  Service: Gastroenterology;  Laterality: N/A;   FOOT CAPSULE RELEASE W/ PERCUTANEOUS HEEL CORD LENGTHENING, TIBIAL TENDON TRANSFER     age 51 ,and 2010-both legs   JOINT REPLACEMENT Right    both knees partial replacements dates unknown   knee replacment     x 5 per pt. last one- left knee  2014. has had 2 on R 3 on L   TYMPANOPLASTY WITH GRAFT Right 01/08/2018   Procedure: TYMPANOPLASTY WITH POSSIBLE OSSICULAR CHAIN RECONSTRUCTION;  Surgeon: [name], MD;  Location: [facility];  Service: ENT;  Laterality: Right;    There were no vitals filed for this visit.   Subjective Assessment - 06/01/21 1548     Subjective Pt reports soreness in LE's. Doesn't have any RLE pain stating it has improved. Been compliant with HEP.    Pertinent History R lateral knee pain. Was told that her pain was due to her Iliotibial band. Has R lateral knee and lateral patellar soreness. R LE is the stronger leg due to cerebral palsy L LE. L LE needs KAFO. R knee feels like it buckles at times when she first gets up off the bed. R knee cap feels like it pops up. Pt has to straighten out her leg a little prior to stand up from sitting (standing up from a 90 degree knee flexed position makes her knee buckle. Knee pain has been occuring almost a year, at least 9 months.Had home health PT for 3 visits which helped decrease R knee pain. Falls is a regular occurrence per pt. Pt states R lateral hip has been bothering her as well.    Limitations Lifting;House hold activities;Other (comment);Walking;Standing   sleeping   Patient Stated Goals Decrease pain. Wants to get  back to walking outside.    Currently in Pain? No/denies    Pain Onset More than a month ago   October 2018                 There.ex:             Seated hip flexion, emphasis on minimal to no trunk lateral lean compensation, use of UE's for supporting upright posture                          R 2x15 with 1# AW                         L 2x10   Seated RLE knee extension: 2x10, 1# AW   Seated LLE knee extension: 1x15, AAROM. No resistance.    Seated Hip ER bilat: 12x20, RTB. KAFO doffed independently.             Hook lying exercises Bridges: 2x10, good form/technique. Pillow placed under sacrum for pt comfort. Cuing to prevent lumbar  lordosis as compensation for weak gluts. Subjective reports of improvement in LBP with modification.    STS with KAFO doffed, to RW. X6 with CGA on gait belt. No UE support needed. Min VC's for anterior weight shift.  Pt assisted to care for safety  PT Short Term Goals - 05/10/21 1746       PT SHORT TERM GOAL #1   Title Pt will be independent with her initial HEP to decrease pain, improve strength and ability to perform standing tasks more comfortably.    Baseline Pt has not yet started her HEP (05/10/2021)    Time 3    Period Weeks    Status New    Target Date 06/01/21               PT Long Term Goals - 05/10/21 1751       PT LONG TERM GOAL #1   Title Pt will have a decrease in R knee pain to 5/10 or less at worst to promote ability to ambulate, as well as perform standing tasks more comfortably.    Baseline 9/10 R knee pain at worst for the past 3 months (05/10/2021)    Time 8    Period Weeks    Status New    Target Date 07/06/21      PT LONG TERM GOAL #2   Title Pt will improve bilateral hip flexion, extension, abduction and R hip ER strength by at least 1/2 MMT grade to promote ability to ambulate with less difficulty, and more comfortably for her R knee.    Baseline Hip flexion 4-/5 R, 3-/5 L, hip extension 4/5 R 3/5 L, hip abduction 4-/5 R, 3+/5 L, R hip ER 4-/5 (05/10/2021)    Time 8    Period Weeks    Status New    Target Date 07/06/21      PT LONG TERM GOAL #3   Title Pt will improve her R knee FOTO score by at least 10 points as a demonstration of improved function.    Baseline R knee FOTO 41 (05/10/2021)    Time 8    Period Weeks    Status New    Target Date 07/06/21                   Plan - 06/01/21 1716     Clinical Impression Statement Pt  tolerating progression of LE strengthening without aggravation of symptoms. pt reporting no pain prior to session or post session. Pt continuing to display significant weakness in glut max and med with  exercises but dispaolying ability to perform STS without UE support which has been significantly challenging with pt prior to PT. Pt will continue to benefit from skilled PT services to further decrease pain and improve  strength/function.    Personal Factors and Comorbidities Comorbidity 3+;Finances;Education;Fitness;Past/Current Experience;Social Background;Time since onset of injury/illness/exacerbation;Transportation    Comorbidities Anxiety, hx of falls, cerebral palsy, depression, DM, HTN    Examination-Activity Limitations Squat;Stairs;Locomotion Level;Stand;Transfers;Carry    Stability/Clinical Decision Making Stable/Uncomplicated    Rehab Potential Fair    PT Frequency 2x / week    PT Duration 8 weeks    PT Treatment/Interventions Therapeutic activities;Therapeutic exercise;Balance training;Electrical Stimulation;Iontophoresis 4mg /ml Dexamethasone;Gait training;Neuromuscular re-education;Patient/family education;Manual techniques;Dry needling    PT Next Visit Plan trunk, hip and knee strengthening, femoral control, manual techniques, modalities PRN    Consulted and Agree with Plan of Care Patient             Patient will benefit from skilled therapeutic intervention in order to improve the following deficits and impairments:  Pain, Postural dysfunction, Improper body mechanics, Difficulty walking, Decreased strength, Decreased endurance, Decreased balance, Decreased activity tolerance, Abnormal gait  Visit Diagnosis: Chronic pain of right knee  Muscle weakness (generalized)  Difficulty in walking, not elsewhere classified     Problem List Patient Active Problem List   Diagnosis Date Noted   No-show for appointment 12/22/2020   Obesity due to excess calories 09/22/2020   Nausea 09/20/2020   Sinusitis 08/09/2020   Insomnia due to medical condition 05/11/2020   COPD (chronic obstructive pulmonary disease) ([place]) 03/22/2020   Hamstring tendonitis 01/29/2020   MDD (major  depressive disorder), recurrent, in full remission ([place]) 08/12/2019   Seizure-like activity ([place]) 07/08/2019   Mild episode of recurrent major depressive disorder ([place]) 12/29/2018   Insomnia due to mental condition 12/29/2018   Disorder of vein 03/04/2018   Lumbar sprain 03/04/2018   Muscle weakness 03/04/2018   Neck sprain 03/04/2018   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   Postmenopausal bleeding 02/18/2018   Impingement syndrome of shoulder region 02/04/2018   Chest pain with moderate risk for cardiac etiology 05/19/2017   History of depression 04/15/2017   GAD (generalized anxiety disorder) 04/15/2017   Chronic daily headache 04/15/2017   Panic attack 04/15/2017   Nocturnal enuresis 04/15/2017   Mild cognitive impairment 04/15/2017   Loss of memory 01/14/2017   Pain medication agreement signed 01/08/2017   Headache disorder 12/04/2016   Chronic, continuous use of opioids 07/01/2015   Vertigo 07/01/2015   Chronic midline low back pain without sciatica 03/21/2015   Acute renal failure (ARF) ([place]) 02/19/2015   Type 2 diabetes mellitus without complication ([place]) 09/73/5329   Acute pancreatitis 09/04/2014   Cerebral palsy ([place]) 01/05/2014   Cerebral palsy with spastic/ataxic diplegia 01/05/2014   Hip pain, chronic 04/28/2013   Essential hypertension 01/10/2013   Irritable colon 01/10/2013   Noninfectious gastroenteritis and colitis 01/10/2013   Encounter for long-term (current) use of other medications 12/04/2012   Chronic GERD 08/12/2012   Epigastric pain 08/12/2012   Diarrhea 08/12/2012   Primary localized osteoarthrosis, lower leg 05/07/2012   Difficulty walking 02/06/2012    Salem Caster. Fairly IV, PT, DPT Physical Therapist- [facility]  06/01/2021, 5:20 PM  [facility] [address], [place], [address] Phone: [phone]   Fax:  [phone]  Name: Traci Davis MRN:  [id number] Date of Birth: 02-08-1971

## 2021-06-05 ENCOUNTER — Ambulatory Visit: Payer: Medicare Other

## 2021-06-05 ENCOUNTER — Other Ambulatory Visit: Payer: Self-pay

## 2021-06-05 DIAGNOSIS — M25561 Pain in right knee: Secondary | ICD-10-CM | POA: Diagnosis not present

## 2021-06-05 DIAGNOSIS — R262 Difficulty in walking, not elsewhere classified: Secondary | ICD-10-CM

## 2021-06-05 DIAGNOSIS — M6281 Muscle weakness (generalized): Secondary | ICD-10-CM

## 2021-06-05 DIAGNOSIS — G8929 Other chronic pain: Secondary | ICD-10-CM

## 2021-06-05 NOTE — Therapy (Signed)
[facility] [address] [place], [address] Phone: [phone]   Fax:  [phone]  Physical Therapy Treatment  Patient Details  Name: Traci Davis MRN: [id number] Date of Birth: 14-Apr-1971 Referring Provider (PT): Vonna Kotyk, MD   Encounter Date: 06/05/2021   PT End of Session - 06/05/21 1335     Visit Number 6    Number of Visits 17    Date for PT Re-Evaluation 07/06/21    PT Start Time 1330    PT Stop Time 0998    PT Time Calculation (min) 47 min    Equipment Utilized During Treatment Other (comment)   rw, L KAFO   Activity Tolerance Patient tolerated treatment well    Behavior During Therapy [facility] for tasks assessed/performed             Past Medical History:  Diagnosis Date   Anxiety    Arthritis    Asthma    Cerebral palsy ([place])    Complication of anesthesia    panic attacks before surgery   COPD (chronic obstructive pulmonary disease) ([place])    Depression    Diabetes mellitus without complication ([place])    GERD (gastroesophageal reflux disease)    Hypertension     Past Surgical History:  Procedure Laterality Date   [place] N/A 02/20/2018   Procedure: DILATATION & CURETTAGE/HYSTEROSCOPY WITH MYOSURE ENDOMETRIAL POLYPECTOMY;  Surgeon: Will Bonnet, MD;  Location: [facility];  Service: Gynecology;  Laterality: N/A;   ESOPHAGOGASTRODUODENOSCOPY (EGD) WITH PROPOFOL N/A 01/01/2019   Procedure: ESOPHAGOGASTRODUODENOSCOPY (EGD) WITH PROPOFOL;  Surgeon: Lin Landsman, MD;  Location: [place];  Service: Gastroenterology;  Laterality: N/A;   FOOT CAPSULE RELEASE W/ PERCUTANEOUS HEEL CORD LENGTHENING, TIBIAL TENDON TRANSFER     age 51 ,and 2010-both legs   JOINT REPLACEMENT Right    both knees partial replacements dates unknown   knee replacment     x 5 per pt. last one- left knee  2014. has had 2 on R 3 on L   TYMPANOPLASTY WITH GRAFT Right 01/08/2018   Procedure: TYMPANOPLASTY WITH POSSIBLE OSSICULAR CHAIN RECONSTRUCTION;  Surgeon: [name], MD;  Location: [facility];  Service: ENT;  Laterality: Right;    There were no vitals filed for this visit.   Subjective Assessment - 06/05/21 1331     Subjective Pt reports flare up in R knee at a 6-7/10 NPS. Been wearing R knee arthritis sleeve for support. Reports knee cap has been "collapsing". Pt denies falls.    Pertinent History R lateral knee pain. Was told that her pain was due to her Iliotibial band. Has R lateral knee and lateral patellar soreness. R LE is the stronger leg due to cerebral palsy L LE. L LE needs KAFO. R knee feels like it buckles at times when she first gets up off the bed. R knee cap feels like it pops up. Pt has to straighten out her leg a little prior to stand up from sitting (standing up from a 90 degree knee flexed position makes her knee buckle. Knee pain has been occuring almost a year, at least 9 months.Had home health PT for 3 visits which helped decrease R knee pain. Falls is a regular occurrence per pt. Pt states R lateral hip has been bothering her as well.    Limitations Lifting;House hold activities;Other (comment);Walking;Standing   sleeping  Patient Stated Goals Decrease pain. Wants to get back to walking outside.    Currently in Pain? Yes    Pain Score 7     Pain Location Knee    Pain Orientation Right    Pain Descriptors / Indicators Sore;Sharp;Shooting    Pain Onset More than a month ago   October 2018             There.ex:   Seated R knee flex/extension AAROM on med ball: 2x20   Seated R hip add isometrics: 2x10, 5 sec holds   Seated hip clamshells with RTB: 2x10, 5 sec holds. AAROM provided for L hip.   Seated hip extension isometrics on RLE: 2x10, 2-3 sec holds for pain modulation  Seated hamstring stretch with belt: 2x30 sec    Seated hamstring flossing with belt with  ankle PF/DF: x20  Seated heel raise on RLE: 2x20   Hook lying:    Figure 4 stretch with maxA to maintain R foot in position. 1 min x3 bouts    Figure 4 heel to shin on RLE with AAROM for hip flexion and abduction mobility and stretch: 2x15.    Manual Therapy:  Pt seated for 10 min with STM to R vastus lateralis and lateral hamstrings for pain modulation and tissue extensibility.    Pain from 6/10 NPS to 4/10 NPS post session. Pt assisted to car for safety.    PT Education - 06/05/21 1333     Education Details form/technique with exercise.    Person(s) Educated Patient    Methods Explanation;Demonstration;Tactile cues;Verbal cues    Comprehension Verbalized understanding;Returned demonstration              PT Short Term Goals - 05/10/21 1746       PT SHORT TERM GOAL #1   Title Pt will be independent with her initial HEP to decrease pain, improve strength and ability to perform standing tasks more comfortably.    Baseline Pt has not yet started her HEP (05/10/2021)    Time 3    Period Weeks    Status New    Target Date 06/01/21               PT Long Term Goals - 05/10/21 1751       PT LONG TERM GOAL #1   Title Pt will have a decrease in R knee pain to 5/10 or less at worst to promote ability to ambulate, as well as perform standing tasks more comfortably.    Baseline 9/10 R knee pain at worst for the past 3 months (05/10/2021)    Time 8    Period Weeks    Status New    Target Date 07/06/21      PT LONG TERM GOAL #2   Title Pt will improve bilateral hip flexion, extension, abduction and R hip ER strength by at least 1/2 MMT grade to promote ability to ambulate with less difficulty, and more comfortably for her R knee.    Baseline Hip flexion 4-/5 R, 3-/5 L, hip extension 4/5 R 3/5 L, hip abduction 4-/5 R, 3+/5 L, R hip ER 4-/5 (05/10/2021)    Time 8    Period Weeks    Status New    Target Date 07/06/21      PT LONG TERM GOAL #3   Title Pt will improve her R  knee FOTO score by at least 10 points as a demonstration of improved function.    Baseline R knee  FOTO 41 (05/10/2021)    Time 8    Period Weeks    Status New    Target Date 07/06/21                   Plan - 06/05/21 1340     Clinical Impression Statement Focus of session on pain modulation due to flare up of R knee pain. Use of gentle mobility, manual techniques, strengthening, and isometrics improved pain from 6/10 NPS to 4/10 NPS post session. Pt educated on benefits of at home proximal hip stretch (figure 4) to improve pain and mobility. Pt will continue to benefit from skilled PT services to improve pain and mobility for ADL's and walking tasks.    Personal Factors and Comorbidities Comorbidity 3+;Finances;Education;Fitness;Past/Current Experience;Social Background;Time since onset of injury/illness/exacerbation;Transportation    Comorbidities Anxiety, hx of falls, cerebral palsy, depression, DM, HTN    Examination-Activity Limitations Squat;Stairs;Locomotion Level;Stand;Transfers;Carry    Stability/Clinical Decision Making Stable/Uncomplicated    Rehab Potential Fair    PT Frequency 2x / week    PT Duration 8 weeks    PT Treatment/Interventions Therapeutic activities;Therapeutic exercise;Balance training;Electrical Stimulation;Iontophoresis 4mg /ml Dexamethasone;Gait training;Neuromuscular re-education;Patient/family education;Manual techniques;Dry needling    PT Next Visit Plan trunk, hip and knee strengthening, femoral control, manual techniques, modalities PRN    Consulted and Agree with Plan of Care Patient             Patient will benefit from skilled therapeutic intervention in order to improve the following deficits and impairments:  Pain, Postural dysfunction, Improper body mechanics, Difficulty walking, Decreased strength, Decreased endurance, Decreased balance, Decreased activity tolerance, Abnormal gait  Visit Diagnosis: Chronic pain of right knee  Muscle  weakness (generalized)  Difficulty in walking, not elsewhere classified     Problem List Patient Active Problem List   Diagnosis Date Noted   No-show for appointment 12/22/2020   Obesity due to excess calories 09/22/2020   Nausea 09/20/2020   Sinusitis 08/09/2020   Insomnia due to medical condition 05/11/2020   COPD (chronic obstructive pulmonary disease) ([place]) 03/22/2020   Hamstring tendonitis 01/29/2020   MDD (major depressive disorder), recurrent, in full remission ([place]) 08/12/2019   Seizure-like activity ([place]) 07/08/2019   Mild episode of recurrent major depressive disorder ([place]) 12/29/2018   Insomnia due to mental condition 12/29/2018   Disorder of vein 03/04/2018   Lumbar sprain 03/04/2018   Muscle weakness 03/04/2018   Neck sprain 03/04/2018   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   Postmenopausal bleeding 02/18/2018   Impingement syndrome of shoulder region 02/04/2018   Chest pain with moderate risk for cardiac etiology 05/19/2017   History of depression 04/15/2017   GAD (generalized anxiety disorder) 04/15/2017   Chronic daily headache 04/15/2017   Panic attack 04/15/2017   Nocturnal enuresis 04/15/2017   Mild cognitive impairment 04/15/2017   Loss of memory 01/14/2017   Pain medication agreement signed 01/08/2017   Headache disorder 12/04/2016   Chronic, continuous use of opioids 07/01/2015   Vertigo 07/01/2015   Chronic midline low back pain without sciatica 03/21/2015   Acute renal failure (ARF) ([place]) 02/19/2015   Type 2 diabetes mellitus without complication ([place]) 42/70/6237   Acute pancreatitis 09/04/2014   Cerebral palsy ([place]) 01/05/2014   Cerebral palsy with spastic/ataxic diplegia 01/05/2014   Hip pain, chronic 04/28/2013   Essential hypertension 01/10/2013   Irritable colon 01/10/2013   Noninfectious gastroenteritis and colitis 01/10/2013   Encounter for long-term (current) use of other medications 12/04/2012   Chronic GERD 08/12/2012   Epigastric  pain  08/12/2012   Diarrhea 08/12/2012   Primary localized osteoarthrosis, lower leg 05/07/2012   Difficulty walking 02/06/2012    Salem Caster. Fairly IV, PT, DPT Physical Therapist- [facility]  06/05/2021, 3:16 PM  [facility] [address], [place], [address] Phone: [phone]   Fax:  [phone]  Name: Traci Davis MRN: [id number] Date of Birth: 1970/07/20

## 2021-06-06 ENCOUNTER — Encounter: Payer: Self-pay | Admitting: Psychiatry

## 2021-06-06 ENCOUNTER — Telehealth (INDEPENDENT_AMBULATORY_CARE_PROVIDER_SITE_OTHER): Payer: Medicare Other | Admitting: Psychiatry

## 2021-06-06 DIAGNOSIS — F3342 Major depressive disorder, recurrent, in full remission: Secondary | ICD-10-CM

## 2021-06-06 DIAGNOSIS — G4701 Insomnia due to medical condition: Secondary | ICD-10-CM

## 2021-06-06 DIAGNOSIS — F411 Generalized anxiety disorder: Secondary | ICD-10-CM | POA: Diagnosis not present

## 2021-06-06 MED ORDER — MIRTAZAPINE 15 MG PO TABS: 15.0000 mg | ORAL_TABLET | Freq: Every day | ORAL | 0 refills | Status: DC

## 2021-06-06 NOTE — Progress Notes (Signed)
Virtual Visit via Video Note  I connected with Traci Davis on 06/06/21 at  4:30 PM EST by a video enabled telemedicine application and verified that I am speaking with the correct person using two identifiers.  Location Provider Location : [facility] Patient Location : Home  Participants: Patient , Provider    I discussed the limitations of evaluation and management by telemedicine and the availability of in person appointments. The patient expressed understanding and agreed to proceed.   I discussed the assessment and treatment plan with the patient. The patient was provided an opportunity to ask questions and all were answered. The patient agreed with the plan and demonstrated an understanding of the instructions.   The patient was advised to call back or seek an in-person evaluation if the symptoms worsen or if the condition fails to improve as anticipated.    [facility] MD OP Progress Note  06/06/2021 5:27 PM Traci Davis  MRN:  [id number]  Chief Complaint:  Chief Complaint   Follow-up 51 year old Caucasian female who has a history of MDD, GAD, insomnia presented for medication management.      HPI: Traci Davis is a 51 year old Caucasian female, lives in [place], on [place], has a history of MDD, GAD, insomnia, cerebral palsy, diabetes mellitus was evaluated by telemedicine today.  Patient today reports she is currently struggling with sleep.  She reports the mirtazapine is not very helpful at this dosage.  She reports the first few nights she did not sleep and then recently she added melatonin 10 mg and she slept better.  She is agreeable to dosage increase.  Denies side effects.  Patient reports she continues to be in a lot of pain.  The pain also does have an impact on her sleep at night.  She does have upcoming appointment with pain provider and agrees to discuss.  Patient reports she also wonders whether her antidepressants could be contributing to her sleep problems.  She wakes  up late morning and does not eat until 1 PM.  She reports she waits until 1 or 2 to take her medications like Prozac and Wellbutrin.  Discussed with patient likely this could be contributing to her sleep problems as well.  Provided education about taking the Prozac earlier and may be taking it with a snack rather than waiting until lunchtime.  Agreeable to discontinuing Wellbutrin, likely contributing to sleep problems.  Patient denies any suicidality, homicidality or perceptual disturbances.  Patient denies any other concerns today.  Visit Diagnosis:    ICD-10-CM   1. MDD (major depressive disorder), recurrent, in full remission ([place])  F33.42 mirtazapine (REMERON) 15 MG tablet    2. GAD (generalized anxiety disorder)  F41.1 mirtazapine (REMERON) 15 MG tablet    3. Insomnia due to medical condition  G47.01 mirtazapine (REMERON) 15 MG tablet   pain, mood      Past Psychiatric History: Reviewed past psychiatric history from progress note on 09/12/2017.  Past trials of Xanax, Klonopin, Cymbalta, Rozerem, Ambien, trazodone, doxepin  Past Medical History:  Past Medical History:  Diagnosis Date   Anxiety    Arthritis    Asthma    Cerebral palsy (HCC)    Complication of anesthesia    panic attacks before surgery   COPD (chronic obstructive pulmonary disease) (HCC)    Depression    Diabetes mellitus without complication (HCC)    GERD (gastroesophageal reflux disease)    Hypertension     Past Surgical History:  Procedure  Laterality Date   CESAREAN SECTION  1995   CHOLECYSTECTOMY     DILATATION & CURETTAGE/HYSTEROSCOPY WITH MYOSURE N/A 02/20/2018   Procedure: DILATATION & CURETTAGE/HYSTEROSCOPY WITH MYOSURE ENDOMETRIAL POLYPECTOMY;  Surgeon: Will Bonnet, MD;  Location: [facility];  Service: Gynecology;  Laterality: N/A;   ESOPHAGOGASTRODUODENOSCOPY (EGD) WITH PROPOFOL N/A 01/01/2019   Procedure: ESOPHAGOGASTRODUODENOSCOPY (EGD) WITH PROPOFOL;  Surgeon: Lin Landsman, MD;   Location: [facility];  Service: Gastroenterology;  Laterality: N/A;   FOOT CAPSULE RELEASE W/ PERCUTANEOUS HEEL CORD LENGTHENING, TIBIAL TENDON TRANSFER     age 41 ,and 2010-both legs   JOINT REPLACEMENT Right    both knees partial replacements dates unknown   knee replacment     x 5 per pt. last one- left knee 2014. has had 2 on R 3 on L   TYMPANOPLASTY WITH GRAFT Right 01/08/2018   Procedure: TYMPANOPLASTY WITH POSSIBLE OSSICULAR CHAIN RECONSTRUCTION;  Surgeon: [name], MD;  Location: [facility];  Service: ENT;  Laterality: Right;    Family Psychiatric History: Reviewed family psychiatric history from progress note on 09/12/2017.  Family History:  Family History  Problem Relation Age of Onset   CAD Father    Heart attack Brother    Sexual abuse Paternal Grandfather    Breast cancer Neg Hx     Social History: Reviewed social history from progress note on 09/12/2017. Social History   Socioeconomic History   Marital status: Single    Spouse name: Not on file   Number of children: 1   Years of education: Not on file   Highest education level: Associate degree: occupational, Hotel manager, or vocational program  Occupational History   Not on file  Tobacco Use   Smoking status: Never   Smokeless tobacco: Never  Vaping Use   Vaping Use: Never used  Substance and Sexual Activity   Alcohol use: No   Drug use: No   Sexual activity: Yes    Partners: Male    Birth control/protection: Condom  Other Topics Concern   Not on file  Social History Narrative   Not on file   Social Determinants of Health   Financial Resource Strain: Not on file  Food Insecurity: Not on file  Transportation Needs: Not on file  Physical Activity: Not on file  Stress: Not on file  Social Connections: Not on file    Allergies:  Allergies  Allergen Reactions   Meloxicam Other (See Comments)    Damage kidney   Morphine And Related Other (See Comments)    hallucinations   Vantin [Cefpodoxime]  Nausea And Vomiting   Latex Itching   Baclofen Other (See Comments)    "makes cerebral palsy do adverse reaction on me", tightens muscles    Ciprofloxacin Itching and Nausea And Vomiting   Tape Rash    skin tears.  Paper tape is ok   Tramadol Nausea Only    Metabolic Disorder Labs: Lab Results  Component Value Date   HGBA1C 6.1 (H) 02/19/2015   No results found for: PROLACTIN No results found for: CHOL, TRIG, HDL, CHOLHDL, VLDL, LDLCALC Lab Results  Component Value Date   TSH 2.198 07/10/2016   TSH 2.891 02/19/2015    Therapeutic Level Labs: No results found for: LITHIUM No results found for: VALPROATE No components found for:  CBMZ  Current Medications: Current Outpatient Medications  Medication Sig Dispense Refill   mirtazapine (REMERON) 15 MG tablet Take 1 tablet (15 mg total) by mouth at bedtime. For sleep 30 tablet  0   acetaminophen (TYLENOL) 325 MG tablet Take 2 tablets (650 mg total) by mouth every 6 (six) hours as needed. 60 tablet 0   albuterol (PROVENTIL HFA;VENTOLIN HFA) 108 (90 BASE) MCG/ACT inhaler Inhale 2 puffs into the lungs 4 (four) times daily as needed. For shortness of breath and/or wheezing     ANORO ELLIPTA 62.5-25 MCG/INH AEPB      atorvastatin (LIPITOR) 20 MG tablet Take 1 tablet by mouth daily.     Blood Glucose Monitoring Suppl (GLUCOCOM BLOOD GLUCOSE MONITOR) DEVI Test daily before all meals/snacks and once before bedtime.     chlorzoxazone (PARAFON) 500 MG tablet Take 500 mg by mouth 3 (three) times daily.     cloNIDine (CATAPRES - DOSED IN MG/24 HR) 0.1 mg/24hr patch Place 1 patch (0.1 mg total) onto the skin once a week. 4 patch 11   CREON 36000 units CPEP capsule      cyclobenzaprine (FLEXERIL) 10 MG tablet Take by mouth.     cyclobenzaprine (FLEXERIL) 10 MG tablet Take by mouth.     diazepam (VALIUM) 5 MG tablet Take by mouth.     diazepam (VALIUM) 5 MG tablet Take by mouth.     diclofenac Sodium (VOLTAREN) 1 % GEL Apply 1 application  topically 4 (four) times daily.     diclofenac Sodium (VOLTAREN) 1 % GEL Apply topically.     dicyclomine (BENTYL) 10 MG capsule Take 10 mg by mouth as needed.      dicyclomine (BENTYL) 10 MG capsule Take by mouth.     donepezil (ARICEPT) 10 MG tablet Take by mouth.     fluconazole (DIFLUCAN) 150 MG tablet Take 150 mg by mouth once.     FLUoxetine (PROZAC) 40 MG capsule Take 1 capsule (40 mg total) by mouth daily. Dose change 90 capsule 1   fluticasone (FLONASE) 50 MCG/ACT nasal spray 1 spray into each nostril two (2) times a day.     fluticasone (FLONASE) 50 MCG/ACT nasal spray 1 spray into each nostril two (2) times a day.     furosemide (LASIX) 40 MG tablet Take 40 mg by mouth daily as needed for fluid.      glimepiride (AMARYL) 4 MG tablet Take 4 mg by mouth daily with breakfast.     GLOBAL EASE INJECT PEN NEEDLES 31G X 5 MM MISC Inject into the skin.     glucose blood test strip USE TO TEST BLOOD SUGAR TWO TIMES A DAY     HYDROmorphone (DILAUDID) 2 MG tablet Take 2 mg by mouth 4 (four) times daily.     incobotulinumtoxinA (XEOMIN) 100 units SOLR injection Inject 100 Units into the muscle every 3 (three) months.      insulin glargine (LANTUS) 100 UNIT/ML Solostar Pen Inject into the skin.     Insulin Pen Needle (PEN NEEDLES 31GX5/16") 31G X 8 MM MISC Injection Frequency is 1 time per day; Dx Code: Type 2 Diabetes uncontrolled (E11.65)     Lancets (FREESTYLE) lancets Test daily before all meals/snacks     LANTUS SOLOSTAR 100 UNIT/ML Solostar Pen Inject into the skin.     lidocaine (LIDODERM) 5 % Place onto the skin.     lidocaine (LIDODERM) 5 % Place onto the skin.     LIDOPRO 4-4-5 % PTCH 1 patch patch every twelve hours     LINZESS 145 MCG CAPS capsule Take 1 capsule by mouth every morning  for constipation     Melatonin 10 MG TBCR Take by  mouth.     metFORMIN (GLUCOPHAGE-XR) 500 MG 24 hr tablet Take by mouth.     metFORMIN (GLUCOPHAGE-XR) 500 MG 24 hr tablet Take by mouth.      mirabegron ER (MYRBETRIQ) 25 MG TB24 tablet Take 1 tablet by mouth daily.     naloxone (NARCAN) nasal spray 4 mg/0.1 mL One spray in either nostril once for known/suspected opioid overdose. May repeat every 2-3 minutes in alternating nostril til EMS arrives     nystatin (MYCOSTATIN/NYSTOP) powder Apply topically.     omeprazole (PRILOSEC) 40 MG capsule Take by mouth.     ondansetron (ZOFRAN) 4 MG tablet      oxyCODONE (OXY IR/ROXICODONE) 5 MG immediate release tablet Take 5 mg by mouth 4 (four) times daily as needed.     oxyCODONE ER (XTAMPZA ER) 9 MG C12A Take 9 mg by mouth daily.     oxyCODONE-acetaminophen (PERCOCET/ROXICET) 5-325 MG tablet Take by mouth.     PAXLOVID, 300/100, 20 x 150 MG & 10 x 100MG  TBPK SMARTSIG:3 By Mouth Twice Daily     predniSONE (DELTASONE) 20 MG tablet Take 20 mg by mouth 2 (two) times daily.     promethazine (PHENERGAN) 25 MG tablet Take by mouth.     solifenacin (VESICARE) 10 MG tablet Take 10 mg by mouth daily.     XTAMPZA ER 13.5 MG C12A Take by mouth daily as needed.     No current facility-administered medications for this visit.     Musculoskeletal: Strength & Muscle Tone:  UTA Gait & Station:  Seated Patient leans: N/A  Psychiatric Specialty Exam: Review of Systems  Musculoskeletal:  Positive for back pain.  Psychiatric/Behavioral:  Positive for sleep disturbance. The patient is nervous/anxious.   All other systems reviewed and are negative.  Last menstrual period 08/20/2014.There is no height or weight on file to calculate BMI.  General Appearance: Casual  Eye Contact:  Fair  Speech:  Normal Rate  Volume:  Increased  Mood:  Anxious coping well  Affect:  Congruent  Thought Process:  Goal Directed and Descriptions of Associations: Intact  Orientation:  Full (Time, Place, and Person)  Thought Content: Logical   Suicidal Thoughts:  No  Homicidal Thoughts:  No  Memory:  Immediate;   Fair Recent;   Fair Remote;   Fair  Judgement:  Fair   Insight:  Fair  Psychomotor Activity:  Normal  Concentration:  Concentration: Fair and Attention Span: Fair  Recall:  [employer] of Knowledge: Fair  Language: Fair  Akathisia:  No  Handed:  Right  AIMS (if indicated): not done  Assets:  Communication Skills Desire for Improvement Housing Social Support  ADL's:  Intact  Cognition: WNL  Sleep:  Poor   Screenings: GAD-7    Flowsheet Row Video Visit from 06/06/2021 in [place]  Total GAD-7 Score 1      PHQ2-9    [place] Video Visit from 06/06/2021 in [place] Visit from 05/23/2021 in [place] Visit from 08/30/2020 in [place] Video Visit from 06/30/2020 in [place] Video Visit from 05/11/2020 in [place]  PHQ-2 Total Score 0 0 0 0 4  PHQ-9 Total Score -- -- -- -- 14      Flowsheet Row Video Visit from 06/06/2021 in [place] Visit from 08/30/2020 in [place] Video Visit from 06/30/2020 in [place]  C-SSRS RISK CATEGORY No Risk Error: Question 6 not populated No Risk        Assessment and Plan: Traci Davis is a 51 year old Caucasian female who has a history of depression, GAD, chronic pain was evaluated by telemedicine today.  Patient continues to struggle with sleep.  Discussed the following plan.  Plan MDD in remission We will continue Prozac however advised patient to take Prozac 40 mg p.o. daily earlier in the morning with a snack. Discontinue Wellbutrin since she is taking it too late and discussed likely could be contributing to sleep problems.  GAD-stable Prozac 40 mg p.o. daily  Insomnia-unstable Increase mirtazapine to 15 mg p.o. nightly Continue melatonin 10 mg p.o. nightly She will need sufficient pain  management.  Follow-up in clinic in 1 week or sooner if needed.  This note was generated in part or whole with voice recognition software. Voice recognition is usually quite accurate but there are transcription errors that can and very often do occur. I apologize for any typographical errors that were not detected and corrected.       Ursula Alert, MD 06/07/2021, 1:07 PM

## 2021-06-08 ENCOUNTER — Ambulatory Visit: Payer: Medicare Other

## 2021-06-12 ENCOUNTER — Telehealth (INDEPENDENT_AMBULATORY_CARE_PROVIDER_SITE_OTHER): Payer: Medicare Other | Admitting: Psychiatry

## 2021-06-12 ENCOUNTER — Other Ambulatory Visit: Payer: Self-pay

## 2021-06-12 ENCOUNTER — Ambulatory Visit: Payer: Medicare Other

## 2021-06-12 ENCOUNTER — Encounter: Payer: Self-pay | Admitting: Psychiatry

## 2021-06-12 DIAGNOSIS — M25561 Pain in right knee: Secondary | ICD-10-CM

## 2021-06-12 DIAGNOSIS — F411 Generalized anxiety disorder: Secondary | ICD-10-CM

## 2021-06-12 DIAGNOSIS — F3342 Major depressive disorder, recurrent, in full remission: Secondary | ICD-10-CM

## 2021-06-12 DIAGNOSIS — R262 Difficulty in walking, not elsewhere classified: Secondary | ICD-10-CM

## 2021-06-12 DIAGNOSIS — G4701 Insomnia due to medical condition: Secondary | ICD-10-CM

## 2021-06-12 DIAGNOSIS — M6281 Muscle weakness (generalized): Secondary | ICD-10-CM

## 2021-06-12 DIAGNOSIS — G8929 Other chronic pain: Secondary | ICD-10-CM

## 2021-06-12 MED ORDER — MIRTAZAPINE 15 MG PO TABS: 15.0000 mg | ORAL_TABLET | Freq: Every day | ORAL | 0 refills | Status: DC

## 2021-06-12 NOTE — Progress Notes (Signed)
Virtual Visit via Video Note  I connected with Traci Davis on 06/12/21 at  4:40 PM EST by a video enabled telemedicine application and verified that I am speaking with the correct person using two identifiers.  Location Provider Location : [facility] Patient Location : Home  Participants: Patient , Provider    I discussed the limitations of evaluation and management by telemedicine and the availability of in person appointments. The patient expressed understanding and agreed to proceed.   I discussed the assessment and treatment plan with the patient. The patient was provided an opportunity to ask questions and all were answered. The patient agreed with the plan and demonstrated an understanding of the instructions.   The patient was advised to call back or seek an in-person evaluation if the symptoms worsen or if the condition fails to improve as anticipated. [facility] MD OP Progress Note  06/12/2021 9:54 AM Traci Davis  MRN:  [id number]  Chief Complaint:  Chief Complaint   Follow-up, 51 year old Caucasian female who has a history of MDD, GAD, insomnia, presented for medication management.    HPI: Traci Davis is a 51 year old Caucasian female, lives in [place], on [place], has a history of MDD, GAD, insomnia, cerebral palsy, diabetes mellitus was evaluated by telemedicine today.  Patient today reports she continues to be in significant pain.  She had pain management appointment recently and received lidocaine injections.  Patient however reports she continues to struggle with chronic back pain, shoulder pain as well as carpal tunnel syndrome, bilateral wrist contributing to pain.  This does have an impact on her mood as well as sleep.  Patient reports sleep continues to be restless.  She reports she woke up around 1 PM yesterday.  She took all her medications for the day around 3 PM.  She reports she tried to take her mirtazapine 7.5 mg for sleep at 8:30 PM last night however could not sleep  until 2 AM.  Patient reports she finally took another half tablet of mirtazapine 15 mg around that time and finally was able to fall asleep.  Patient has poor sleep hygiene.  She is also likely taking medications like Prozac late which could also be contributing to sleep problems.  Patient agreeable to start working on her sleep hygiene.  Her pain could also be a factor.  Denies suicidality, homicidality or perceptual disturbances.  Patient denies any other concerns today.  Visit Diagnosis:    ICD-10-CM   1. MDD (major depressive disorder), recurrent, in full remission ([place])  F33.42 mirtazapine (REMERON) 15 MG tablet    2. GAD (generalized anxiety disorder)  F41.1 mirtazapine (REMERON) 15 MG tablet    3. Insomnia due to medical condition  G47.01 mirtazapine (REMERON) 15 MG tablet   pain, mood      Past Psychiatric History: Reviewed past psychiatric history from progress note on 09/12/2017.  Past trials of Xanax, Klonopin, Cymbalta, Rozerem, Ambien, trazodone, doxepin.  Past Medical History:  Past Medical History:  Diagnosis Date   Anxiety    Arthritis    Asthma    Cerebral palsy ([place])    Complication of anesthesia    panic attacks before surgery   COPD (chronic obstructive pulmonary disease) ([place])    Depression    Diabetes mellitus without complication (HCC)    GERD (gastroesophageal reflux disease)    Hypertension     Past Surgical History:  Procedure Laterality Date   [place]  DILATATION & CURETTAGE/HYSTEROSCOPY WITH MYOSURE N/A 02/20/2018   Procedure: DILATATION & CURETTAGE/HYSTEROSCOPY WITH MYOSURE ENDOMETRIAL POLYPECTOMY;  Surgeon: Will Bonnet, MD;  Location: [facility];  Service: Gynecology;  Laterality: N/A;   ESOPHAGOGASTRODUODENOSCOPY (EGD) WITH PROPOFOL N/A 01/01/2019   Procedure: ESOPHAGOGASTRODUODENOSCOPY (EGD) WITH PROPOFOL;  Surgeon: Lin Landsman, MD;  Location: [facility];  Service: Gastroenterology;  Laterality:  N/A;   FOOT CAPSULE RELEASE W/ PERCUTANEOUS HEEL CORD LENGTHENING, TIBIAL TENDON TRANSFER     age 39 ,and 2010-both legs   JOINT REPLACEMENT Right    both knees partial replacements dates unknown   knee replacment     x 5 per pt. last one- left knee 2014. has had 2 on R 3 on L   TYMPANOPLASTY WITH GRAFT Right 01/08/2018   Procedure: TYMPANOPLASTY WITH POSSIBLE OSSICULAR CHAIN RECONSTRUCTION;  Surgeon: [name], MD;  Location: [facility];  Service: ENT;  Laterality: Right;    Family Psychiatric History: Reviewed family psychiatric history from progress note on 09/12/2017.  Family History:  Family History  Problem Relation Age of Onset   CAD Father    Heart attack Brother    Sexual abuse Paternal Grandfather    Breast cancer Neg Hx     Social History: Reviewed social history from progress note on 09/12/2017. Social History   Socioeconomic History   Marital status: Single    Spouse name: Not on file   Number of children: 1   Years of education: Not on file   Highest education level: Associate degree: occupational, Hotel manager, or vocational program  Occupational History   Not on file  Tobacco Use   Smoking status: Never   Smokeless tobacco: Never  Vaping Use   Vaping Use: Never used  Substance and Sexual Activity   Alcohol use: No   Drug use: No   Sexual activity: Yes    Partners: Male    Birth control/protection: Condom  Other Topics Concern   Not on file  Social History Narrative   Not on file   Social Determinants of Health   Financial Resource Strain: Not on file  Food Insecurity: Not on file  Transportation Needs: Not on file  Physical Activity: Not on file  Stress: Not on file  Social Connections: Not on file    Allergies:  Allergies  Allergen Reactions   Meloxicam Other (See Comments)    Damage kidney   Morphine And Related Other (See Comments)    hallucinations   Vantin [Cefpodoxime] Nausea And Vomiting   Latex Itching   Baclofen Other (See  Comments)    "makes cerebral palsy do adverse reaction on me", tightens muscles    Ciprofloxacin Itching and Nausea And Vomiting   Tape Rash    skin tears.  Paper tape is ok   Tramadol Nausea Only    Metabolic Disorder Labs: Lab Results  Component Value Date   HGBA1C 6.1 (H) 02/19/2015   No results found for: PROLACTIN No results found for: CHOL, TRIG, HDL, CHOLHDL, VLDL, LDLCALC Lab Results  Component Value Date   TSH 2.198 07/10/2016   TSH 2.891 02/19/2015    Therapeutic Level Labs: No results found for: LITHIUM No results found for: VALPROATE No components found for:  CBMZ  Current Medications: Current Outpatient Medications  Medication Sig Dispense Refill   acetaminophen (TYLENOL) 325 MG tablet Take 2 tablets (650 mg total) by mouth every 6 (six) hours as needed. 60 tablet 0   albuterol (PROVENTIL HFA;VENTOLIN HFA) 108 (90 BASE) MCG/ACT inhaler Inhale  2 puffs into the lungs 4 (four) times daily as needed. For shortness of breath and/or wheezing     ANORO ELLIPTA 62.5-25 MCG/INH AEPB      atorvastatin (LIPITOR) 20 MG tablet Take 1 tablet by mouth daily.     Blood Glucose Monitoring Suppl (GLUCOCOM BLOOD GLUCOSE MONITOR) DEVI Test daily before all meals/snacks and once before bedtime.     chlorzoxazone (PARAFON) 500 MG tablet Take 500 mg by mouth 3 (three) times daily.     cloNIDine (CATAPRES - DOSED IN MG/24 HR) 0.1 mg/24hr patch Place 1 patch (0.1 mg total) onto the skin once a week. 4 patch 11   CREON 36000 units CPEP capsule      cyclobenzaprine (FLEXERIL) 10 MG tablet Take by mouth.     cyclobenzaprine (FLEXERIL) 10 MG tablet Take by mouth.     diazepam (VALIUM) 5 MG tablet Take by mouth.     diazepam (VALIUM) 5 MG tablet Take by mouth.     diclofenac Sodium (VOLTAREN) 1 % GEL Apply 1 application topically 4 (four) times daily.     diclofenac Sodium (VOLTAREN) 1 % GEL Apply topically.     dicyclomine (BENTYL) 10 MG capsule Take 10 mg by mouth as needed.       dicyclomine (BENTYL) 10 MG capsule Take by mouth.     donepezil (ARICEPT) 10 MG tablet Take by mouth.     fluconazole (DIFLUCAN) 150 MG tablet Take 150 mg by mouth once.     FLUoxetine (PROZAC) 40 MG capsule Take 1 capsule (40 mg total) by mouth daily. Dose change 90 capsule 1   fluticasone (FLONASE) 50 MCG/ACT nasal spray 1 spray into each nostril two (2) times a day.     fluticasone (FLONASE) 50 MCG/ACT nasal spray 1 spray into each nostril two (2) times a day.     furosemide (LASIX) 40 MG tablet Take 40 mg by mouth daily as needed for fluid.      glimepiride (AMARYL) 4 MG tablet Take 4 mg by mouth daily with breakfast.     GLOBAL EASE INJECT PEN NEEDLES 31G X 5 MM MISC Inject into the skin.     glucose blood test strip USE TO TEST BLOOD SUGAR TWO TIMES A DAY     HYDROmorphone (DILAUDID) 2 MG tablet Take 2 mg by mouth 4 (four) times daily.     incobotulinumtoxinA (XEOMIN) 100 units SOLR injection Inject 100 Units into the muscle every 3 (three) months.      insulin glargine (LANTUS) 100 UNIT/ML Solostar Pen Inject into the skin.     Insulin Pen Needle (PEN NEEDLES 31GX5/16") 31G X 8 MM MISC Injection Frequency is 1 time per day; Dx Code: Type 2 Diabetes uncontrolled (E11.65)     Lancets (FREESTYLE) lancets Test daily before all meals/snacks     LANTUS SOLOSTAR 100 UNIT/ML Solostar Pen Inject into the skin.     lidocaine (LIDODERM) 5 % Place onto the skin.     lidocaine (LIDODERM) 5 % Place onto the skin.     LIDOPRO 4-4-5 % PTCH 1 patch patch every twelve hours     LINZESS 145 MCG CAPS capsule Take 1 capsule by mouth every morning  for constipation     Melatonin 10 MG TBCR Take by mouth.     metFORMIN (GLUCOPHAGE-XR) 500 MG 24 hr tablet Take by mouth.     metFORMIN (GLUCOPHAGE-XR) 500 MG 24 hr tablet Take by mouth.     mirabegron ER (MYRBETRIQ) 25  MG TB24 tablet Take 1 tablet by mouth daily.     mirtazapine (REMERON) 15 MG tablet Take 1 tablet (15 mg total) by mouth at bedtime. For sleep 90  tablet 0   naloxone (NARCAN) nasal spray 4 mg/0.1 mL One spray in either nostril once for known/suspected opioid overdose. May repeat every 2-3 minutes in alternating nostril til EMS arrives     nystatin (MYCOSTATIN/NYSTOP) powder Apply topically.     omeprazole (PRILOSEC) 40 MG capsule Take by mouth.     ondansetron (ZOFRAN) 4 MG tablet      oxyCODONE (OXY IR/ROXICODONE) 5 MG immediate release tablet Take 5 mg by mouth 4 (four) times daily as needed.     [START ON 08/16/2021] oxyCODONE ER (XTAMPZA ER) 13.5 MG C12A Take by mouth.     oxyCODONE ER (XTAMPZA ER) 9 MG C12A Take 9 mg by mouth daily.     oxyCODONE-acetaminophen (PERCOCET/ROXICET) 5-325 MG tablet Take by mouth.     [START ON 06/17/2021] oxyCODONE-acetaminophen (PERCOCET/ROXICET) 5-325 MG tablet Take by mouth.     PAXLOVID, 300/100, 20 x 150 MG & 10 x 100MG  TBPK SMARTSIG:3 By Mouth Twice Daily     predniSONE (DELTASONE) 20 MG tablet Take 20 mg by mouth 2 (two) times daily.     promethazine (PHENERGAN) 25 MG tablet Take by mouth.     solifenacin (VESICARE) 10 MG tablet Take 10 mg by mouth daily.     XTAMPZA ER 13.5 MG C12A Take by mouth daily as needed.     No current facility-administered medications for this visit.     Musculoskeletal: Strength & Muscle Tone:  UTA Gait & Station:  Seated Patient leans: N/A  Psychiatric Specialty Exam: Review of Systems  Musculoskeletal:  Positive for arthralgias and back pain.       BL shoulder and wrist pain  Psychiatric/Behavioral:  Positive for sleep disturbance. The patient is nervous/anxious.   All other systems reviewed and are negative.  Last menstrual period 08/20/2014.There is no height or weight on file to calculate BMI.  General Appearance: Casual  Eye Contact:  Fair  Speech:  Clear and Coherent  Volume:  Normal  Mood:  Anxious coping   Affect:  Congruent  Thought Process:  Goal Directed and Descriptions of Associations: Intact  Orientation:  Full (Time, Place, and Person)   Thought Content: Logical   Suicidal Thoughts:  No  Homicidal Thoughts:  No  Memory:  Immediate;   Fair Recent;   Fair Remote;   Fair  Judgement:  Fair  Insight:  Fair  Psychomotor Activity:  Normal  Concentration:  Concentration: Fair and Attention Span: Fair  Recall:  [employer] of Knowledge: Fair  Language: Fair  Akathisia:  No  Handed:  Right  AIMS (if indicated): not done  Assets:  Communication Skills Desire for Improvement Housing Social Support  ADL's:  Intact  Cognition: WNL  Sleep:   restless   Screenings: GAD-7    Flowsheet Row Video Visit from 06/06/2021 in [facility]  Total GAD-7 Score 1      PHQ2-9    Traci Davis Video Visit from 06/06/2021 in [facility] Visit from 05/23/2021 in [facility] Visit from 08/30/2020 in [facility] Video Visit from 06/30/2020 in [facility] Video Visit from 05/11/2020 in [facility]  PHQ-2 Total Score 0 0 0 0 4  PHQ-9 Total Score -- -- -- -- 14  Flowsheet Row Video Visit from 06/06/2021 in [facility] Office Visit from 08/30/2020 in [facility] Video Visit from 06/30/2020 in [facility] No Risk Error: Question 6 not populated No Risk        Assessment and Plan: Traci Davis is a 51 year old Caucasian female who has a history of depression, GAD, chronic pain was evaluated by telemedicine today.  Patient with continued sleep problems, discussed plan as noted below.  Plan MDD in remission Prozac 40 mg p.o. daily in the morning.  Patient advised again to take Prozac earlier in the morning due to her sleep problems.   GAD-stable Prozac 40 mg p.o. daily in the morning  Insomnia-some improvement Patient's sleep problems likely  multifactorial including her pain, lack of sleep hygiene. Provided education about sleep hygiene including having a set wake-up time and a set bedtime.  Also discussed with patient about not taking her medications like Prozac too late.  Patient also will need sufficient pain management. Continue mirtazapine 15 mg p.o. nightly Melatonin 10 mg p.o. nightly  Follow-up in clinic in 2 months or sooner if needed.  This note was generated in part or whole with voice recognition software. Voice recognition is usually quite accurate but there are transcription errors that can and very often do occur. I apologize for any typographical errors that were not detected and corrected.     Ursula Alert, MD 06/12/2021, 9:54 AM

## 2021-06-12 NOTE — Therapy (Signed)
[facility] [address], [place], [address] Phone: [phone]   Fax:  [phone]  Physical Therapy Treatment  Patient Details  Name: Traci Davis MRN: [id number] Date of Birth: [birth year] Referring Provider (PT): Vonna Kotyk, MD   Encounter Date: 52/13/2023   PT End of Session - 06/12/21 1332     Visit Number 7    Number of Visits 17    Date for PT Re-Evaluation 07/06/21    PT Start Time 52 54    PT Stop Time 1404    PT Time Calculation (min) 32 min    Equipment Utilized During Treatment Other (comment)   rw, L KAFO   Activity Tolerance Patient tolerated treatment well    Behavior During Therapy [facility] for tasks assessed/performed             Past Medical History:  Diagnosis Date   Anxiety    Arthritis    Asthma    Cerebral palsy ([place])    Complication of anesthesia    panic attacks before surgery   COPD (chronic obstructive pulmonary disease) ([place])    Depression    Diabetes mellitus without complication ([place])    GERD (gastroesophageal reflux disease)    Hypertension     Past Surgical History:  Procedure Laterality Date   [place] N/A 02/20/2018   Procedure: DILATATION & CURETTAGE/HYSTEROSCOPY WITH MYOSURE ENDOMETRIAL POLYPECTOMY;  Surgeon: Will Bonnet, MD;  Location: [facility];  Service: Gynecology;  Laterality: N/A;   ESOPHAGOGASTRODUODENOSCOPY (EGD) WITH PROPOFOL N/A 01/01/2019   Procedure: ESOPHAGOGASTRODUODENOSCOPY (EGD) WITH PROPOFOL;  Surgeon: Lin Landsman, MD;  Location: [place] [facility];  Service: Gastroenterology;  Laterality: N/A;   FOOT CAPSULE RELEASE W/ PERCUTANEOUS HEEL CORD LENGTHENING, TIBIAL TENDON TRANSFER     age 3 ,and 2010-both legs   JOINT REPLACEMENT Right    both knees partial replacements dates unknown   knee replacment     x 5 per pt. last one- left knee  2014. has had 2 on R 3 on L   TYMPANOPLASTY WITH GRAFT Right 01/08/2018   Procedure: TYMPANOPLASTY WITH POSSIBLE OSSICULAR CHAIN RECONSTRUCTION;  Surgeon: [name], MD;  Location: [facility];  Service: ENT;  Laterality: Right;    There were no vitals filed for this visit.   Subjective Assessment - 06/12/21 1336     Subjective R knee has been bothering her for the past 3-4 days. R knee wants to collapse on her when she first tries to stand up on it in the morning. 7/10 R knee pain currently. Has to put pressure on R LE because of her L LE brace.    Pertinent History R lateral knee pain. Was told that her pain was due to her Iliotibial band. Has R lateral knee and lateral patellar soreness. R LE is the stronger leg due to cerebral palsy L LE. L LE needs KAFO. R knee feels like it buckles at times when she first gets up off the bed. R knee cap feels like it pops up. Pt has to straighten out her leg a little prior to stand up from sitting (standing up from a 90 degree knee flexed position makes her knee buckle. Knee pain has been occuring almost a year, at least 9 months.Had home health PT for 3 visits which helped decrease R knee pain. Falls is a regular occurrence per pt. Pt states  R lateral hip has been bothering her as well.    Limitations Lifting;House hold activities;Other (comment);Walking;Standing   sleeping   Patient Stated Goals Decrease pain. Wants to get back to walking outside.    Currently in Pain? Yes    Pain Score 7     Pain Location Knee    Pain Onset More than a month ago   October 2018                                       PT Education - 06/12/21 1605     Education Details ther-ex    [employer]) Educated Patient    Methods Explanation;Demonstration;Tactile cues;Verbal cues    Comprehension Returned demonstration;Verbalized understanding           Objective       Therapeutic exercise   Seated hip extension isometrics             R 5x5  seconds for 3 sets             L 5x5 seconds for 3 sets  Seated knee extension              L 3x5 with 5 second holds  R 10x5 seconds for 2 sets  Standing B UE assist   Hip abduction    L 10x    Seated hip flexion, emphasis on minimal to no trunk lateral lean compensation              R 10x             L 10x       Improved exercise technique, movement at target joints, use of target muscles after mod verbal, visual, tactile cues.        Manual therapy Seated STM R lateral hamstrings,  IT band to decrease fascial restrictions and muscle tension.       Accompanied pt from clinic to car for safety due to fall risk.        Response to treatment Improved L LE clearance/circumduction during gait with knee locked in extension, and decreased R knee pain after session.       Clinical impression Worked on decreasing R lateral hamstrings muscle tension ad decrease soft tissue restrictions R lateral knee to promote better mechanics during gait. Also worked on B knee extension strength and L hip abduction strength to promote ability to ambulate with less difficulty. Improved L LE foot clearance (hip circumduction secondary to knee brace locked in extension) and decreased R knee pain to 4/10 after session.  Pt will benefit from continued skilled physical therapy services to decrease pain, improve strength and function.      PT Short Term Goals - 05/10/21 1746       PT SHORT TERM GOAL #1   Title Pt will be independent with her initial HEP to decrease pain, improve strength and ability to perform standing tasks more comfortably.    Baseline Pt has not yet started her HEP (05/10/2021)    Time 3    Period Weeks    Status New    Target Date 06/01/21               PT Long Term Goals - 05/10/21 1751       PT LONG TERM GOAL #1   Title Pt will have a decrease in R knee pain to 5/10 or less at  worst to promote ability to ambulate, as well as perform standing tasks more  comfortably.    Baseline 9/10 R knee pain at worst for the past 3 months (05/10/2021)    Time 8    Period Weeks    Status New    Target Date 07/06/21      PT LONG TERM GOAL #2   Title Pt will improve bilateral hip flexion, extension, abduction and R hip ER strength by at least 1/2 MMT grade to promote ability to ambulate with less difficulty, and more comfortably for her R knee.    Baseline Hip flexion 4-/5 R, 3-/5 L, hip extension 4/5 R 3/5 L, hip abduction 4-/5 R, 3+/5 L, R hip ER 4-/5 (05/10/2021)    Time 8    Period Weeks    Status New    Target Date 07/06/21      PT LONG TERM GOAL #3   Title Pt will improve her R knee FOTO score by at least 10 points as a demonstration of improved function.    Baseline R knee FOTO 41 (05/10/2021)    Time 8    Period Weeks    Status New    Target Date 07/06/21                   Plan - 06/12/21 1605     Clinical Impression Statement Worked on decreasing R lateral hamstrings muscle tension ad decrease soft tissue restrictions R lateral knee to promote better mechanics during gait. Also worked on B knee extension strength and L hip abduction strength to promote ability to ambulate with less difficulty. Improved L LE foot clearance (hip circumduction secondary to knee brace locked in extension) and decreased R knee pain to 4/10 after session.  Pt will benefit from continued skilled physical therapy services to decrease pain, improve strength and function.    Personal Factors and Comorbidities Comorbidity 3+;Finances;Education;Fitness;Past/Current Experience;Social Background;Time since onset of injury/illness/exacerbation;Transportation    Comorbidities Anxiety, hx of falls, cerebral palsy, depression, DM, HTN    Examination-Activity Limitations Squat;Stairs;Locomotion Level;Stand;Transfers;Carry    Stability/Clinical Decision Making Stable/Uncomplicated    Rehab Potential Fair    PT Frequency 2x / week    PT Duration 8 weeks    PT  Treatment/Interventions Therapeutic activities;Therapeutic exercise;Balance training;Electrical Stimulation;Iontophoresis 4mg /ml Dexamethasone;Gait training;Neuromuscular re-education;Patient/family education;Manual techniques;Dry needling    PT Next Visit Plan trunk, hip and knee strengthening, femoral control, manual techniques, modalities PRN    Consulted and Agree with Plan of Care Patient             Patient will benefit from skilled therapeutic intervention in order to improve the following deficits and impairments:  Pain, Postural dysfunction, Improper body mechanics, Difficulty walking, Decreased strength, Decreased endurance, Decreased balance, Decreased activity tolerance, Abnormal gait  Visit Diagnosis: Chronic pain of right knee  Muscle weakness (generalized)  Difficulty in walking, not elsewhere classified     Problem List Patient Active Problem List   Diagnosis Date Noted   No-show for appointment 12/22/2020   Obesity due to excess calories 09/22/2020   Nausea 09/20/2020   Sinusitis 08/09/2020   Insomnia due to medical condition 05/11/2020   COPD (chronic obstructive pulmonary disease) ([place]) 03/22/2020   Hamstring tendonitis 01/29/2020   MDD (major depressive disorder), recurrent, in full remission ([place]) 08/12/2019   Seizure-like activity ([place]) 07/08/2019   Mild episode of recurrent major depressive disorder ([place]) 12/29/2018   Insomnia due to mental condition 12/29/2018   Disorder of vein 03/04/2018  Lumbar sprain 03/04/2018   Muscle weakness 03/04/2018   Neck sprain 03/04/2018   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   Postmenopausal bleeding 02/18/2018   Impingement syndrome of shoulder region 02/04/2018   Chest pain with moderate risk for cardiac etiology 05/19/2017   History of depression 04/15/2017   GAD (generalized anxiety disorder) 04/15/2017   Chronic daily headache 04/15/2017   Panic attack 04/15/2017   Nocturnal enuresis 04/15/2017   Mild cognitive  impairment 04/15/2017   Loss of memory 01/14/2017   Pain medication agreement signed 01/08/2017   Headache disorder 12/04/2016   Chronic, continuous use of opioids 07/01/2015   Vertigo 07/01/2015   Chronic midline low back pain without sciatica 03/21/2015   Acute renal failure (ARF) ([place]) 02/19/2015   Type 2 diabetes mellitus without complication ([place]) 88/89/1694   Acute pancreatitis 09/04/2014   Cerebral palsy ([place]) 01/05/2014   Cerebral palsy with spastic/ataxic diplegia 01/05/2014   Hip pain, chronic 04/28/2013   Essential hypertension 01/10/2013   Irritable colon 01/10/2013   Noninfectious gastroenteritis and colitis 01/10/2013   Encounter for long-term (current) use of other medications 12/04/2012   Chronic GERD 08/12/2012   Epigastric pain 08/12/2012   Diarrhea 08/12/2012   Primary localized osteoarthrosis, lower leg 05/07/2012   Difficulty walking 02/06/2012   Joneen Boers PT, DPT  06/12/2021, 4:09 PM  [facility] [address] [place], [address] Phone: [phone]   Fax:  [phone]  Name: Traci Davis MRN: [id number] Date of Birth: [birth year]

## 2021-06-14 ENCOUNTER — Other Ambulatory Visit: Payer: Self-pay

## 2021-06-14 ENCOUNTER — Ambulatory Visit: Payer: Medicare Other

## 2021-06-14 DIAGNOSIS — M25561 Pain in right knee: Secondary | ICD-10-CM | POA: Diagnosis not present

## 2021-06-14 DIAGNOSIS — R262 Difficulty in walking, not elsewhere classified: Secondary | ICD-10-CM

## 2021-06-14 DIAGNOSIS — G8929 Other chronic pain: Secondary | ICD-10-CM

## 2021-06-14 DIAGNOSIS — M6281 Muscle weakness (generalized): Secondary | ICD-10-CM

## 2021-06-14 NOTE — Patient Instructions (Signed)
Access Code: [email or web address] URL: [email or web address] Date: 06/14/2021 Prepared by: Joneen Boers  Exercises Seated Long Arc Quad - 1 x daily - 7 x weekly - 2-3 sets - 10 reps - 5 seconds hold

## 2021-06-14 NOTE — Therapy (Signed)
[facility] [address] [place], [address] Phone: [phone]   Fax:  [phone]  Physical Therapy Treatment  Patient Details  Name: Traci Davis MRN: [id number] Date of Birth: December 19, 1970 Referring Provider (PT): Vonna Kotyk, MD   Encounter Date: 06/14/2021   PT End of Session - 06/14/21 1328     Visit Number 8    Number of Visits 17    Date for PT Re-Evaluation 07/06/21    PT Start Time 1694    PT Stop Time 5038    PT Time Calculation (min) 40 min    Equipment Utilized During Treatment Other (comment)   rw, L KAFO   Activity Tolerance Patient tolerated treatment well    Behavior During Therapy [facility] for tasks assessed/performed             Past Medical History:  Diagnosis Date   Anxiety    Arthritis    Asthma    Cerebral palsy ([place])    Complication of anesthesia    panic attacks before surgery   COPD (chronic obstructive pulmonary disease) ([place])    Depression    Diabetes mellitus without complication ([place])    GERD (gastroesophageal reflux disease)    Hypertension     Past Surgical History:  Procedure Laterality Date   [place] N/A 02/20/2018   Procedure: DILATATION & CURETTAGE/HYSTEROSCOPY WITH MYOSURE ENDOMETRIAL POLYPECTOMY;  Surgeon: Will Bonnet, MD;  Location: [facility];  Service: Gynecology;  Laterality: N/A;   ESOPHAGOGASTRODUODENOSCOPY (EGD) WITH PROPOFOL N/A 01/01/2019   Procedure: ESOPHAGOGASTRODUODENOSCOPY (EGD) WITH PROPOFOL;  Surgeon: Lin Landsman, MD;  Location: [facility];  Service: Gastroenterology;  Laterality: N/A;   FOOT CAPSULE RELEASE W/ PERCUTANEOUS HEEL CORD LENGTHENING, TIBIAL TENDON TRANSFER     age 51 ,and 2010-both legs   JOINT REPLACEMENT Right    both knees partial replacements dates unknown   knee replacment     x 5 per pt. last one- left knee  2014. has had 2 on R 3 on L   TYMPANOPLASTY WITH GRAFT Right 01/08/2018   Procedure: TYMPANOPLASTY WITH POSSIBLE OSSICULAR CHAIN RECONSTRUCTION;  Surgeon: [name], MD;  Location: [facility];  Service: ENT;  Laterality: Right;    There were no vitals filed for this visit.   Subjective Assessment - 06/14/21 1330     Subjective R knee still feels like its buckling. No R knee pain currently. Took pain medicine earlier.    Pertinent History R lateral knee pain. Was told that her pain was due to her Iliotibial band. Has R lateral knee and lateral patellar soreness. R LE is the stronger leg due to cerebral palsy L LE. L LE needs KAFO. R knee feels like it buckles at times when she first gets up off the bed. R knee cap feels like it pops up. Pt has to straighten out her leg a little prior to stand up from sitting (standing up from a 90 degree knee flexed position makes her knee buckle. Knee pain has been occuring almost a year, at least 9 months.Had home health PT for 3 visits which helped decrease R knee pain. Falls is a regular occurrence per pt. Pt states R lateral hip has been bothering her as well.    Limitations Lifting;House hold activities;Other (comment);Walking;Standing   sleeping   Patient Stated Goals Decrease pain. Wants to get back to  walking outside.    Currently in Pain? No/denies    Pain Onset More than a month ago   October 2018                                       PT Education - 06/14/21 1331     Education Details ther-ex    [employer]) Educated Patient    Methods Explanation;Demonstration;Tactile cues;Verbal cues    Comprehension Returned demonstration;Verbalized understanding            Objective    [employer] Access Code [email or web address]    Therapeutic exercise    Seated knee extension              L 8x with 5 second holds with PT assist to end range              R 10x5 seconds for 2 sets  Seated hip extension isometrics             R  10x5 seconds for 2 sets             L 10x5 seconds for 2 sets    Seated leg press blue band   R 5x3. PT holding band for pt.   L 5x3. PT holding band for pt.   Standing B UE assist              Hip abduction                          L 10x2                           Seated hip flexion, emphasis on minimal to no trunk lateral lean compensation              R 10x             L 10x       Improved exercise technique, movement at target joints, use of target muscles after mod verbal, visual, tactile cues.        Manual therapy Seated STM R lateral hamstrings,  IT band to decrease fascial restrictions and muscle tension.        Accompanied pt from clinic to car for safety due to fall risk.          Response to treatment Pt tolerated session well without aggravation of symptoms.          Clinical impression Continued working on decreasing soft tissue restrictions R thigh and knee to promote better mechanics during standing tasks. Continued working on hip and leg extension strengthening to help decrease buckling sensation R LE as well as improve ability to advance L LE with knee locked in extension when ambulating. Pt tolerated session well without aggravation of symptoms. Pt will benefit from continued skilled physical therapy services to decrease pain, improve strength and function.          PT Short Term Goals - 05/10/21 1746       PT SHORT TERM GOAL #1   Title Pt will be independent with her initial HEP to decrease pain, improve strength and ability to perform standing tasks more comfortably.    Baseline Pt has not yet started her HEP (05/10/2021)    Time 3    Period Weeks    Status New  Target Date 06/01/21               PT Long Term Goals - 05/10/21 1751       PT LONG TERM GOAL #1   Title Pt will have a decrease in R knee pain to 5/10 or less at worst to promote ability to ambulate, as well as perform standing tasks more comfortably.    Baseline 9/10  R knee pain at worst for the past 3 months (05/10/2021)    Time 8    Period Weeks    Status New    Target Date 07/06/21      PT LONG TERM GOAL #2   Title Pt will improve bilateral hip flexion, extension, abduction and R hip ER strength by at least 1/2 MMT grade to promote ability to ambulate with less difficulty, and more comfortably for her R knee.    Baseline Hip flexion 4-/5 R, 3-/5 L, hip extension 4/5 R 3/5 L, hip abduction 4-/5 R, 3+/5 L, R hip ER 4-/5 (05/10/2021)    Time 8    Period Weeks    Status New    Target Date 07/06/21      PT LONG TERM GOAL #3   Title Pt will improve her R knee FOTO score by at least 10 points as a demonstration of improved function.    Baseline R knee FOTO 41 (05/10/2021)    Time 8    Period Weeks    Status New    Target Date 07/06/21                   Plan - 06/14/21 1327     Clinical Impression Statement Continued working on decreasing soft tissue restrictions R thigh and knee to promote better mechanics during standing tasks. Continued working on hip and leg extension strengthening to help decrease buckling sensation R LE as well as improve ability to advance L LE with knee locked in extension when ambulating. Pt tolerated session well without aggravation of symptoms. Pt will benefit from continued skilled physical therapy services to decrease pain, improve strength and function.    Personal Factors and Comorbidities Comorbidity 3+;Finances;Education;Fitness;Past/Current Experience;Social Background;Time since onset of injury/illness/exacerbation;Transportation    Comorbidities Anxiety, hx of falls, cerebral palsy, depression, DM, HTN    Examination-Activity Limitations Squat;Stairs;Locomotion Level;Stand;Transfers;Carry    Stability/Clinical Decision Making Stable/Uncomplicated    Rehab Potential Fair    PT Frequency 2x / week    PT Duration 8 weeks    PT Treatment/Interventions Therapeutic activities;Therapeutic exercise;Balance  training;Electrical Stimulation;Iontophoresis 4mg /ml Dexamethasone;Gait training;Neuromuscular re-education;Patient/family education;Manual techniques;Dry needling    PT Next Visit Plan trunk, hip and knee strengthening, femoral control, manual techniques, modalities PRN    Consulted and Agree with Plan of Care Patient             Patient will benefit from skilled therapeutic intervention in order to improve the following deficits and impairments:  Pain, Postural dysfunction, Improper body mechanics, Difficulty walking, Decreased strength, Decreased endurance, Decreased balance, Decreased activity tolerance, Abnormal gait  Visit Diagnosis: Chronic pain of right knee  Muscle weakness (generalized)  Difficulty in walking, not elsewhere classified     Problem List Patient Active Problem List   Diagnosis Date Noted   No-show for appointment 12/22/2020   Obesity due to excess calories 09/22/2020   Nausea 09/20/2020   Sinusitis 08/09/2020   Insomnia due to medical condition 05/11/2020   COPD (chronic obstructive pulmonary disease) ([place]) 03/22/2020   Hamstring tendonitis 01/29/2020  MDD (major depressive disorder), recurrent, in full remission ([place]) 08/12/2019   Seizure-like activity ([place]) 07/08/2019   Mild episode of recurrent major depressive disorder ([place]) 12/29/2018   Insomnia due to mental condition 12/29/2018   Disorder of vein 03/04/2018   Lumbar sprain 03/04/2018   Muscle weakness 03/04/2018   Neck sprain 03/04/2018   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   Postmenopausal bleeding 02/18/2018   Impingement syndrome of shoulder region 02/04/2018   Chest pain with moderate risk for cardiac etiology 05/19/2017   History of depression 04/15/2017   GAD (generalized anxiety disorder) 04/15/2017   Chronic daily headache 04/15/2017   Panic attack 04/15/2017   Nocturnal enuresis 04/15/2017   Mild cognitive impairment 04/15/2017   Loss of memory 01/14/2017   Pain medication  agreement signed 01/08/2017   Headache disorder 12/04/2016   Chronic, continuous use of opioids 07/01/2015   Vertigo 07/01/2015   Chronic midline low back pain without sciatica 03/21/2015   Acute renal failure (ARF) ([place]) 02/19/2015   Type 2 diabetes mellitus without complication ([place]) 25/42/7062   Acute pancreatitis 09/04/2014   Cerebral palsy ([place]) 01/05/2014   Cerebral palsy with spastic/ataxic diplegia 01/05/2014   Hip pain, chronic 04/28/2013   Essential hypertension 01/10/2013   Irritable colon 01/10/2013   Noninfectious gastroenteritis and colitis 01/10/2013   Encounter for long-term (current) use of other medications 12/04/2012   Chronic GERD 08/12/2012   Epigastric pain 08/12/2012   Diarrhea 08/12/2012   Primary localized osteoarthrosis, lower leg 05/07/2012   Difficulty walking 02/06/2012    Joneen Boers PT, DPT  06/14/2021, 5:54 PM   [facility] [address] [place], [address] Phone: [phone]   Fax:  [phone]  Name: Traci Davis MRN: [id number] Date of Birth: [birth year]

## 2021-06-19 ENCOUNTER — Ambulatory Visit: Payer: Medicare Other

## 2021-06-19 ENCOUNTER — Other Ambulatory Visit: Payer: Self-pay

## 2021-06-19 DIAGNOSIS — M25561 Pain in right knee: Secondary | ICD-10-CM | POA: Diagnosis not present

## 2021-06-19 DIAGNOSIS — M6281 Muscle weakness (generalized): Secondary | ICD-10-CM

## 2021-06-19 DIAGNOSIS — R262 Difficulty in walking, not elsewhere classified: Secondary | ICD-10-CM

## 2021-06-19 DIAGNOSIS — G8929 Other chronic pain: Secondary | ICD-10-CM

## 2021-06-19 NOTE — Therapy (Signed)
[facility] [address] [place], [address] Phone: [phone]   Fax:  [phone]  Physical Therapy Treatment  Patient Details  Name: Traci Davis MRN: [id number] Date of Birth: 03-Feb-1971 Referring Provider (PT): Vonna Kotyk, MD   Encounter Date: 06/19/2021   PT End of Session - 06/19/21 1331     Visit Number 9    Number of Visits 17    Date for PT Re-Evaluation 07/06/21    PT Start Time 1607    PT Stop Time 3710    PT Time Calculation (min) 38 min    Equipment Utilized During Treatment Other (comment)   rw, L KAFO   Activity Tolerance Patient tolerated treatment well    Behavior During Therapy [facility] for tasks assessed/performed             Past Medical History:  Diagnosis Date   Anxiety    Arthritis    Asthma    Cerebral palsy ([place])    Complication of anesthesia    panic attacks before surgery   COPD (chronic obstructive pulmonary disease) ([place])    Depression    Diabetes mellitus without complication ([place])    GERD (gastroesophageal reflux disease)    Hypertension     Past Surgical History:  Procedure Laterality Date   [place] N/A 02/20/2018   Procedure: DILATATION & CURETTAGE/HYSTEROSCOPY WITH MYOSURE ENDOMETRIAL POLYPECTOMY;  Surgeon: Will Bonnet, MD;  Location: [facility];  Service: Gynecology;  Laterality: N/A;   ESOPHAGOGASTRODUODENOSCOPY (EGD) WITH PROPOFOL N/A 01/01/2019   Procedure: ESOPHAGOGASTRODUODENOSCOPY (EGD) WITH PROPOFOL;  Surgeon: Lin Landsman, MD;  Location: [facility];  Service: Gastroenterology;  Laterality: N/A;   FOOT CAPSULE RELEASE W/ PERCUTANEOUS HEEL CORD LENGTHENING, TIBIAL TENDON TRANSFER     age 51 ,and 2010-both legs   JOINT REPLACEMENT Right    both knees partial replacements dates unknown   knee replacment     x 5 per pt. last one- left knee  2014. has had 2 on R 3 on L   TYMPANOPLASTY WITH GRAFT Right 01/08/2018   Procedure: TYMPANOPLASTY WITH POSSIBLE OSSICULAR CHAIN RECONSTRUCTION;  Surgeon: [name], MD;  Location: [facility];  Service: ENT;  Laterality: Right;    There were no vitals filed for this visit.   Subjective Assessment - 06/19/21 1333     Subjective Feels like she is walking better. R knee is doing better. No pain currently. Normally takes her pain medicine. 7/10 L knee pain at most for the past 7 days.    Pertinent History R lateral knee pain. Was told that her pain was due to her Iliotibial band. Has R lateral knee and lateral patellar soreness. R LE is the stronger leg due to cerebral palsy L LE. L LE needs KAFO. R knee feels like it buckles at times when she first gets up off the bed. R knee cap feels like it pops up. Pt has to straighten out her leg a little prior to stand up from sitting (standing up from a 90 degree knee flexed position makes her knee buckle. Knee pain has been occuring almost a year, at least 9 months.Had home health PT for 3 visits which helped decrease R knee pain. Falls is a regular occurrence per pt. Pt states R lateral hip has been bothering her as well.    Limitations Lifting;House hold activities;Other (comment);Walking;Standing  sleeping   Patient Stated Goals Decrease pain. Wants to get back to walking outside.    Currently in Pain? No/denies    Pain Onset More than a month ago   October 2018                                       PT Education - 06/19/21 1340     Education Details ther-ex    [employer]) Educated Patient    Methods Explanation;Demonstration;Tactile cues;Verbal cues    Comprehension Returned demonstration;Verbalized understanding           Objective    Medbridge Access Code [email or web address]    Therapeutic exercise    Seated knee extension              L 10x with 5 second holds with PT assist to end range               R 10x5 seconds  for 2 sets  Seated hip extension isometrics             R 10x5 seconds for 2 sets             L 10x5 seconds for 2 sets  Seated leg press blue band              R 5x3. PT holding band for pt.              L 5x3. PT holding band for pt.     Standing B UE assist              Hip abduction                          L 10x2   Seated hip flexion, emphasis on minimal to no trunk lateral lean compensation              R 10x             L 10x       Improved exercise technique, movement at target joints, use of target muscles after mod verbal, visual, tactile cues.        Manual therapy Seated STM R lateral hamstrings,  IT band to decrease fascial restrictions and muscle tension.        Accompanied pt from clinic to car for safety due to fall risk.          Response to treatment Pt tolerated session well without aggravation of symptoms.            Clinical impression Improving starting R knee pain levels based of current and previous session. Continued working on decreasing soft tissue restrictions R knee as well as improving glute med, max and quadriceps strength to promote ability to ambulate with less difficulty. Pt tolerated session well without aggravation of symptoms. Pt will benefit from continued skilled physical therapy services to decrease pain, improve strength and function.      PT Short Term Goals - 05/10/21 1746       PT SHORT TERM GOAL #1   Title Pt will be independent with her initial HEP to decrease pain, improve strength and ability to perform standing tasks more comfortably.    Baseline Pt has not yet started her HEP (05/10/2021)    Time 3    Period Weeks    Status New  Target Date 06/01/21               PT Long Term Goals - 05/10/21 1751       PT LONG TERM GOAL #1   Title Pt will have a decrease in R knee pain to 5/10 or less at worst to promote ability to ambulate, as well as perform standing tasks more comfortably.    Baseline 9/10 R  knee pain at worst for the past 3 months (05/10/2021)    Time 8    Period Weeks    Status New    Target Date 07/06/21      PT LONG TERM GOAL #2   Title Pt will improve bilateral hip flexion, extension, abduction and R hip ER strength by at least 1/2 MMT grade to promote ability to ambulate with less difficulty, and more comfortably for her R knee.    Baseline Hip flexion 4-/5 R, 3-/5 L, hip extension 4/5 R 3/5 L, hip abduction 4-/5 R, 3+/5 L, R hip ER 4-/5 (05/10/2021)    Time 8    Period Weeks    Status New    Target Date 07/06/21      PT LONG TERM GOAL #3   Title Pt will improve her R knee FOTO score by at least 10 points as a demonstration of improved function.    Baseline R knee FOTO 41 (05/10/2021)    Time 8    Period Weeks    Status New    Target Date 07/06/21                   Plan - 06/19/21 1340     Clinical Impression Statement Improving starting R knee pain levels based of current and previous session. Continued working on decreasing soft tissue restrictions R knee as well as improving glute med, max and quadriceps strength to promote ability to ambulate with less difficulty. Pt tolerated session well without aggravation of symptoms. Pt will benefit from continued skilled physical therapy services to decrease pain, improve strength and function.    Personal Factors and Comorbidities Comorbidity 3+;Finances;Education;Fitness;Past/Current Experience;Social Background;Time since onset of injury/illness/exacerbation;Transportation    Comorbidities Anxiety, hx of falls, cerebral palsy, depression, DM, HTN    Examination-Activity Limitations Squat;Stairs;Locomotion Level;Stand;Transfers;Carry    Stability/Clinical Decision Making Stable/Uncomplicated    Rehab Potential Fair    PT Frequency 2x / week    PT Duration 8 weeks    PT Treatment/Interventions Therapeutic activities;Therapeutic exercise;Balance training;Electrical Stimulation;Iontophoresis 4mg /ml  Dexamethasone;Gait training;Neuromuscular re-education;Patient/family education;Manual techniques;Dry needling    PT Next Visit Plan trunk, hip and knee strengthening, femoral control, manual techniques, modalities PRN    Consulted and Agree with Plan of Care Patient             Patient will benefit from skilled therapeutic intervention in order to improve the following deficits and impairments:  Pain, Postural dysfunction, Improper body mechanics, Difficulty walking, Decreased strength, Decreased endurance, Decreased balance, Decreased activity tolerance, Abnormal gait  Visit Diagnosis: Chronic pain of right knee  Muscle weakness (generalized)  Difficulty in walking, not elsewhere classified     Problem List Patient Active Problem List   Diagnosis Date Noted   No-show for appointment 12/22/2020   Obesity due to excess calories 09/22/2020   Nausea 09/20/2020   Sinusitis 08/09/2020   Insomnia due to medical condition 05/11/2020   COPD (chronic obstructive pulmonary disease) ([place]) 03/22/2020   Hamstring tendonitis 01/29/2020   MDD (major depressive disorder), recurrent, in full remission ([place])  08/12/2019   Seizure-like activity ([place]) 07/08/2019   Mild episode of recurrent major depressive disorder ([place]) 12/29/2018   Insomnia due to mental condition 12/29/2018   Disorder of vein 03/04/2018   Lumbar sprain 03/04/2018   Muscle weakness 03/04/2018   Neck sprain 03/04/2018   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   Postmenopausal bleeding 02/18/2018   Impingement syndrome of shoulder region 02/04/2018   Chest pain with moderate risk for cardiac etiology 05/19/2017   History of depression 04/15/2017   GAD (generalized anxiety disorder) 04/15/2017   Chronic daily headache 04/15/2017   Panic attack 04/15/2017   Nocturnal enuresis 04/15/2017   Mild cognitive impairment 04/15/2017   Loss of memory 01/14/2017   Pain medication agreement signed 01/08/2017   Headache disorder 12/04/2016    Chronic, continuous use of opioids 07/01/2015   Vertigo 07/01/2015   Chronic midline low back pain without sciatica 03/21/2015   Acute renal failure (ARF) ([place]) 02/19/2015   Type 2 diabetes mellitus without complication ([place]) 81/01/3158   Acute pancreatitis 09/04/2014   Cerebral palsy ([place]) 01/05/2014   Cerebral palsy with spastic/ataxic diplegia 01/05/2014   Hip pain, chronic 04/28/2013   Essential hypertension 01/10/2013   Irritable colon 01/10/2013   Noninfectious gastroenteritis and colitis 01/10/2013   Encounter for long-term (current) use of other medications 12/04/2012   Chronic GERD 08/12/2012   Epigastric pain 08/12/2012   Diarrhea 08/12/2012   Primary localized osteoarthrosis, lower leg 05/07/2012   Difficulty walking 02/06/2012     Joneen Boers PT, DPT  06/19/2021, 8:55 PM  [facility] [address], [place], [address] Phone: [phone]   Fax:  [phone]  Name: TWYLLA ARCENEAUX MRN: [id number] Date of Birth: [birth year]

## 2021-06-21 ENCOUNTER — Ambulatory Visit: Payer: Medicare Other

## 2021-06-21 DIAGNOSIS — G8929 Other chronic pain: Secondary | ICD-10-CM

## 2021-06-21 DIAGNOSIS — M25561 Pain in right knee: Secondary | ICD-10-CM | POA: Diagnosis not present

## 2021-06-21 DIAGNOSIS — M6281 Muscle weakness (generalized): Secondary | ICD-10-CM

## 2021-06-21 DIAGNOSIS — R262 Difficulty in walking, not elsewhere classified: Secondary | ICD-10-CM

## 2021-06-21 NOTE — Therapy (Signed)
[facility] [address], [place], [address] Phone: [phone]   Fax:  [phone]  Physical Therapy Treatment And Progress Report (05/10/2021 - 06/21/2021)  Patient Details  Name: Traci Davis MRN: [id number] Date of Birth: 02-08-1971 Referring Provider (PT): Vonna Kotyk, MD   Encounter Date: 06/21/2021   PT End of Session - 06/21/21 1330     Visit Number 10    Number of Visits 17    Date for PT Re-Evaluation 07/06/21    PT Start Time 1330    PT Stop Time 1411    PT Time Calculation (min) 41 min    Equipment Utilized During Treatment Other (comment)   rw, L KAFO   Activity Tolerance Patient tolerated treatment well    Behavior During Therapy [facility] for tasks assessed/performed             Past Medical History:  Diagnosis Date   Anxiety    Arthritis    Asthma    Cerebral palsy ([place])    Complication of anesthesia    panic attacks before surgery   COPD (chronic obstructive pulmonary disease) ([place])    Depression    Diabetes mellitus without complication ([place])    GERD (gastroesophageal reflux disease)    Hypertension     Past Surgical History:  Procedure Laterality Date   [place] N/A 02/20/2018   Procedure: DILATATION & CURETTAGE/HYSTEROSCOPY WITH MYOSURE ENDOMETRIAL POLYPECTOMY;  Surgeon: Will Bonnet, MD;  Location: [facility];  Service: Gynecology;  Laterality: N/A;   ESOPHAGOGASTRODUODENOSCOPY (EGD) WITH PROPOFOL N/A 01/01/2019   Procedure: ESOPHAGOGASTRODUODENOSCOPY (EGD) WITH PROPOFOL;  Surgeon: Lin Landsman, MD;  Location: [facility];  Service: Gastroenterology;  Laterality: N/A;   FOOT CAPSULE RELEASE W/ PERCUTANEOUS HEEL CORD LENGTHENING, TIBIAL TENDON TRANSFER     age 9 ,and 2010-both legs   JOINT REPLACEMENT Right    both knees partial replacements dates unknown   knee  replacment     x 5 per pt. last one- left knee 2014. has had 2 on R 3 on L   TYMPANOPLASTY WITH GRAFT Right 01/08/2018   Procedure: TYMPANOPLASTY WITH POSSIBLE OSSICULAR CHAIN RECONSTRUCTION;  Surgeon: [name], MD;  Location: [facility];  Service: ENT;  Laterality: Right;    There were no vitals filed for this visit.   Subjective Assessment - 06/21/21 1331     Subjective 6/10 R knee pain currently. Back is bothering her. Boyfriend is also in surgery to remove lung cancer.    Pertinent History R lateral knee pain. Was told that her pain was due to her Iliotibial band. Has R lateral knee and lateral patellar soreness. R LE is the stronger leg due to cerebral palsy L LE. L LE needs KAFO. R knee feels like it buckles at times when she first gets up off the bed. R knee cap feels like it pops up. Pt has to straighten out her leg a little prior to stand up from sitting (standing up from a 90 degree knee flexed position makes her knee buckle. Knee pain has been occuring almost a year, at least 9 months.Had home health PT for 3 visits which helped decrease R knee pain. Falls is a regular occurrence per pt. Pt states R lateral hip has been bothering her as well.    Limitations Lifting;House hold activities;Other (comment);Walking;Standing   sleeping   Patient Stated  Goals Decrease pain. Wants to get back to walking outside.    Currently in Pain? Yes    Pain Score 6     Pain Onset More than a month ago   October 2018                                       PT Education - 06/21/21 1347     Education Details ther-ex    Person(s) Educated Patient    Methods Explanation;Demonstration;Tactile cues;Verbal cues    Comprehension Returned demonstration;Verbalized understanding            Objective    Medbridge Access Code [email or web address]    Therapeutic exercise    Seated manually resisted hip flexion, hip extension, abduction,  1x each way for each LE,  and R hip ER  1x  Reviewed progress with PT towards goals  Seated hip ER   R 10x5 seconds for 2 sets  Seated knee extension              L 10x2 with 5 second holds with PT assist to end range               R 10x5 seconds for 2 sets  Seated leg press blue band              R 10x. PT holding band for pt.              L 10x. PT holding band for pt.    Seated hip extension isometrics             R 10x5 seconds for 2 sets             L 10x5 seconds for 2 sets          Improved exercise technique, movement at target joints, use of target muscles after mod verbal, visual, tactile cues.        Manual therapy  Seated STM R lateral hamstrings, vastus lateralis, IT band to decrease fascial restrictions and muscle tension.        Accompanied pt from clinic to car for safety due to fall risk.         Response to treatment Pt tolerated session well without aggravation of symptoms.         Clinical impression Pt demonstrates improved overall bilateral hip strength and decreased pain level by 2 points since initial evaluation. Pt making progress with PT towards goals. Continued working on decreasing soft tissue restrictions R knee as well as improving glute med, max, and quad strength to promote better mechanics during gait. Pt tolerated session well without aggravation of symptoms. Pt will benefit from continued skilled physical therapy services to decrease pain, improve strength and function.       PT Short Term Goals - 06/21/21 1332       PT SHORT TERM GOAL #1   Title Pt will be independent with her initial HEP to decrease pain, improve strength and ability to perform standing tasks more comfortably.    Baseline Pt has not yet started her HEP (05/10/2021); No questions with her exercises, able to do them at home (06/21/2021)    Time 3    Period Weeks    Status Achieved    Target Date 06/01/21               PT Long Term Goals - 06/21/21  [place] #1   Title Pt  will have a decrease in R knee pain to 5/10 or less at worst to promote ability to ambulate, as well as perform standing tasks more comfortably.    Baseline 9/10 R knee pain at worst for the past 3 months (05/10/2021); 7/10 R knee pain at most for the past 7 days (06/21/2021)    Time 8    Period Weeks    Status Partially Met    Target Date 07/06/21      PT LONG TERM GOAL #2   Title Pt will improve bilateral hip flexion, extension, abduction and R hip ER strength by at least 1/2 MMT grade to promote ability to ambulate with less difficulty, and more comfortably for her R knee.    Baseline Hip flexion 4-/5 R, 3-/5 L, hip extension 4/5 R 3/5 L, hip abduction 4-/5 R, 3+/5 L, R hip ER 4-/5 (05/10/2021);  hip flexion 4/5 R, 4-/5 L, hip extension 5/5 R, 4+/5 L, hip abduction 4-/5 R, 3+/5 L, Hip ER 4-/5 (06/21/2021)    Time 8    Period Weeks    Status Partially Met    Target Date 07/06/21      PT LONG TERM GOAL #3   Title Pt will improve her R knee FOTO score by at least 10 points as a demonstration of improved function.    Baseline R knee FOTO 41 (05/10/2021); 44 (06/21/2021)    Time 8    Period Weeks    Status Partially Met    Target Date 07/06/21                   Plan - 06/21/21 1327     Clinical Impression Statement Pt demonstrates improved overall bilateral hip strength and decreased pain level by 2 points since initial evaluation. Pt making progress with PT towards goals. Continued working on decreasing soft tissue restrictions R knee as well as improving glute med, max, and quad strength to promote better mechanics during gait. Pt tolerated session well without aggravation of symptoms. Pt will benefit from continued skilled physical therapy services to decrease pain, improve strength and function.    Personal Factors and Comorbidities Comorbidity 3+;Finances;Education;Fitness;Past/Current Experience;Social Background;Time since onset of injury/illness/exacerbation;Transportation     Comorbidities Anxiety, hx of falls, cerebral palsy, depression, DM, HTN    Examination-Activity Limitations Squat;Stairs;Locomotion Level;Stand;Transfers;Carry    Stability/Clinical Decision Making Stable/Uncomplicated    Rehab Potential Fair    PT Frequency 2x / week    PT Duration 8 weeks    PT Treatment/Interventions Therapeutic activities;Therapeutic exercise;Balance training;Electrical Stimulation;Iontophoresis 60m/ml Dexamethasone;Gait training;Neuromuscular re-education;Patient/family education;Manual techniques;Dry needling    PT Next Visit Plan trunk, hip and knee strengthening, femoral control, manual techniques, modalities PRN    Consulted and Agree with Plan of Care Patient             Patient will benefit from skilled therapeutic intervention in order to improve the following deficits and impairments:  Pain, Postural dysfunction, Improper body mechanics, Difficulty walking, Decreased strength, Decreased endurance, Decreased balance, Decreased activity tolerance, Abnormal gait  Visit Diagnosis: Chronic pain of right knee  Muscle weakness (generalized)  Difficulty in walking, not elsewhere classified     Problem List Patient Active Problem List   Diagnosis Date Noted   No-show for appointment 12/22/2020   Obesity due to excess calories 09/22/2020   Nausea 09/20/2020   Sinusitis 08/09/2020   Insomnia due to medical  condition 05/11/2020   COPD (chronic obstructive pulmonary disease) ([place]) 03/22/2020   Hamstring tendonitis 01/29/2020   MDD (major depressive disorder), recurrent, in full remission ([place]) 08/12/2019   Seizure-like activity ([place]) 07/08/2019   Mild episode of recurrent major depressive disorder ([place]) 12/29/2018   Insomnia due to mental condition 12/29/2018   Disorder of vein 03/04/2018   Lumbar sprain 03/04/2018   Muscle weakness 03/04/2018   Neck sprain 03/04/2018   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   Postmenopausal bleeding 02/18/2018   Impingement  syndrome of shoulder region 02/04/2018   Chest pain with moderate risk for cardiac etiology 05/19/2017   History of depression 04/15/2017   GAD (generalized anxiety disorder) 04/15/2017   Chronic daily headache 04/15/2017   Panic attack 04/15/2017   Nocturnal enuresis 04/15/2017   Mild cognitive impairment 04/15/2017   Loss of memory 01/14/2017   Pain medication agreement signed 01/08/2017   Headache disorder 12/04/2016   Chronic, continuous use of opioids 07/01/2015   Vertigo 07/01/2015   Chronic midline low back pain without sciatica 03/21/2015   Acute renal failure (ARF) ([place]) 02/19/2015   Type 2 diabetes mellitus without complication ([place]) 13/88/7195   Acute pancreatitis 09/04/2014   Cerebral palsy ([place]) 01/05/2014   Cerebral palsy with spastic/ataxic diplegia 01/05/2014   Hip pain, chronic 04/28/2013   Essential hypertension 01/10/2013   Irritable colon 01/10/2013   Noninfectious gastroenteritis and colitis 01/10/2013   Encounter for long-term (current) use of other medications 12/04/2012   Chronic GERD 08/12/2012   Epigastric pain 08/12/2012   Diarrhea 08/12/2012   Primary localized osteoarthrosis, lower leg 05/07/2012   Difficulty walking 02/06/2012     Thank you for your referral.   Joneen Boers PT, DPT   06/21/2021, 4:07 PM  [facility] [address] [place], [address] Phone: [phone]   Fax:  [phone]  Name: Traci Davis MRN: [id number] Date of Birth: 07/04/1970

## 2021-06-26 ENCOUNTER — Ambulatory Visit: Payer: Medicare Other

## 2021-06-28 ENCOUNTER — Ambulatory Visit: Payer: Medicare Other

## 2021-07-03 ENCOUNTER — Ambulatory Visit: Payer: Medicare Other | Attending: Orthopaedic Surgery

## 2021-07-03 ENCOUNTER — Other Ambulatory Visit: Payer: Self-pay

## 2021-07-03 DIAGNOSIS — M6281 Muscle weakness (generalized): Secondary | ICD-10-CM | POA: Insufficient documentation

## 2021-07-03 DIAGNOSIS — G8929 Other chronic pain: Secondary | ICD-10-CM | POA: Diagnosis present

## 2021-07-03 DIAGNOSIS — M25561 Pain in right knee: Secondary | ICD-10-CM | POA: Insufficient documentation

## 2021-07-03 DIAGNOSIS — R262 Difficulty in walking, not elsewhere classified: Secondary | ICD-10-CM | POA: Insufficient documentation

## 2021-07-03 NOTE — Patient Instructions (Signed)
Access Code: [email or web address] ?URL: [email or web address] ?Date: 07/03/2021 ?Prepared by: Joneen Boers ? ?Exercises ?Seated Long Arc Quad - 1 x daily - 7 x weekly - 2-3 sets - 10 reps - 5 seconds hold ?Standing Hip Abduction with Counter Support - 1 x daily - 7 x weekly - 2 sets - 10 reps ?Seated Hip External Rotation AROM - 1 x daily - 7 x weekly - 2 sets - 10 reps - 5 seconds hold ?Seated Hip Adduction Isometrics with Ball - 1 x daily - 7 x weekly - 2 sets - 10 reps - 5 seconds hold ? ?

## 2021-07-03 NOTE — Therapy (Signed)
[place] [facility] [address] [place], [address] Phone: [phone]   Fax:  [phone]  Physical Therapy Treatment  Patient Details  Name: Traci Davis MRN: [id number] Date of Birth: 09/20/1970 Referring Provider (PT): Vonna Kotyk, MD   Encounter Date: 07/03/2021   PT End of Session - 07/03/21 1334     Visit Number 11    Number of Visits 17    Date for PT Re-Evaluation 07/06/21    PT Start Time 5732    PT Stop Time 1416    PT Time Calculation (min) 42 min    Equipment Utilized During Treatment Other (comment)   rw, L KAFO   Activity Tolerance Patient tolerated treatment well    Behavior During Therapy [facility] for tasks assessed/performed             Past Medical History:  Diagnosis Date   Anxiety    Arthritis    Asthma    Cerebral palsy ([place])    Complication of anesthesia    panic attacks before surgery   COPD (chronic obstructive pulmonary disease) ([place])    Depression    Diabetes mellitus without complication ([place])    GERD (gastroesophageal reflux disease)    Hypertension     Past Surgical History:  Procedure Laterality Date   [place] N/A 02/20/2018   Procedure: DILATATION & CURETTAGE/HYSTEROSCOPY WITH MYOSURE ENDOMETRIAL POLYPECTOMY;  Surgeon: Will Bonnet, MD;  Location: [facility];  Service: Gynecology;  Laterality: N/A;   ESOPHAGOGASTRODUODENOSCOPY (EGD) WITH PROPOFOL N/A 01/01/2019   Procedure: ESOPHAGOGASTRODUODENOSCOPY (EGD) WITH PROPOFOL;  Surgeon: Lin Landsman, MD;  Location: [facility];  Service: Gastroenterology;  Laterality: N/A;   FOOT CAPSULE RELEASE W/ PERCUTANEOUS HEEL CORD LENGTHENING, TIBIAL TENDON TRANSFER     age 51 ,and 2010-both legs   JOINT REPLACEMENT Right    both knees partial replacements dates unknown   knee replacment     x 5 per pt. last one- left knee  2014. has had 2 on R 3 on L   TYMPANOPLASTY WITH GRAFT Right 01/08/2018   Procedure: TYMPANOPLASTY WITH POSSIBLE OSSICULAR CHAIN RECONSTRUCTION;  Surgeon: [name], MD;  Location: [facility];  Service: ENT;  Laterality: Right;    There were no vitals filed for this visit.   Subjective Assessment - 07/03/21 1336     Subjective R knee is alright. Still gives her trouble first thing in the morning. Moving it back and forth helps. No pain currently. 6/10 at most for the past 7 days. Has been doing more for the past 6 days.    Pertinent History R lateral knee pain. Was told that her pain was due to her Iliotibial band. Has R lateral knee and lateral patellar soreness. R LE is the stronger leg due to cerebral palsy L LE. L LE needs KAFO. R knee feels like it buckles at times when she first gets up off the bed. R knee cap feels like it pops up. Pt has to straighten out her leg a little prior to stand up from sitting (standing up from a 90 degree knee flexed position makes her knee buckle. Knee pain has been occuring almost a year, at least 9 months.Had home health PT for 3 visits which helped decrease R knee pain. Falls is a regular occurrence per pt. Pt states R lateral hip has been bothering her as well.  Limitations Lifting;House hold activities;Other (comment);Walking;Standing   sleeping   Patient Stated Goals Decrease pain. Wants to get back to walking outside.    Currently in Pain? No/denies    Pain Onset More than a month ago   October 2018                                       PT Education - 07/03/21 1340     Education Details ther-ex, HEP    Person(s) Educated Patient    Methods Explanation;Demonstration;Tactile cues;Verbal cues;Handout    Comprehension Returned demonstration;Verbalized understanding             Objective    Medbridge Access Code [email or web address]    Therapeutic exercise  Seated hip ER              R 10x5 seconds for 2 sets  Seated  hip adduction isometrics folded pillow squeeze 10x5 seconds    Seated knee extension              L 10x3 with 5 second holds with PT assist to end range             R 10x5 seconds for 2 sets   Seated leg press blue band              R 10x. PT holding band for pt.              L 10x. PT holding band for pt.    Standing with B UE assist   Hip abduction    L 10x2  Seated hip extension isometrics             R 10x5 seconds for 1 sets             L 10x5 seconds for 1 sets          Improved exercise technique, movement at target joints, use of target muscles after mod verbal, visual, tactile cues.        Manual therapy Seated STM R lateral hamstrings, vastus lateralis, IT band to decrease fascial restrictions and muscle tension.        Accompanied pt from clinic to car for safety due to fall risk.         Response to treatment Pt tolerated session well without aggravation of symptoms.         Clinical impression Improving overall R knee pain level based on subjective reports of 6/10 at worst for the past 7 days with addition of increased activity. Continued working on decreasing soft tissue restrictions R knee as well as improving glute med, max, and quad strength to promote better mechanics during gait. Pt tolerated session well without aggravation of symptoms. Pt will benefit from continued skilled physical therapy services to decrease pain, improve strength and function.       PT Short Term Goals - 06/21/21 1332       PT SHORT TERM GOAL #1   Title Pt will be independent with her initial HEP to decrease pain, improve strength and ability to perform standing tasks more comfortably.    Baseline Pt has not yet started her HEP (05/10/2021); No questions with her exercises, able to do them at home (06/21/2021)    Time 3    Period Weeks    Status Achieved    Target Date 06/01/21  PT Long Term Goals - 06/21/21 1333       PT LONG TERM GOAL #1   Title  Pt will have a decrease in R knee pain to 5/10 or less at worst to promote ability to ambulate, as well as perform standing tasks more comfortably.    Baseline 9/10 R knee pain at worst for the past 3 months (05/10/2021); 7/10 R knee pain at most for the past 7 days (06/21/2021)    Time 8    Period Weeks    Status Partially Met    Target Date 07/06/21      PT LONG TERM GOAL #2   Title Pt will improve bilateral hip flexion, extension, abduction and R hip ER strength by at least 1/2 MMT grade to promote ability to ambulate with less difficulty, and more comfortably for her R knee.    Baseline Hip flexion 4-/5 R, 3-/5 L, hip extension 4/5 R 3/5 L, hip abduction 4-/5 R, 3+/5 L, R hip ER 4-/5 (05/10/2021);  hip flexion 4/5 R, 4-/5 L, hip extension 5/5 R, 4+/5 L, hip abduction 4-/5 R, 3+/5 L, Hip ER 4-/5 (06/21/2021)    Time 8    Period Weeks    Status Partially Met    Target Date 07/06/21      PT LONG TERM GOAL #3   Title Pt will improve her R knee FOTO score by at least 10 points as a demonstration of improved function.    Baseline R knee FOTO 41 (05/10/2021); 44 (06/21/2021)    Time 8    Period Weeks    Status Partially Met    Target Date 07/06/21                   Plan - 07/03/21 1342     Clinical Impression Statement Improving overall R knee pain level based on subjective reports of 6/10 at worst for the past 7 days with addition of increased activity. Continued working on decreasing soft tissue restrictions R knee as well as improving glute med, max, and quad strength to promote better mechanics during gait. Pt tolerated session well without aggravation of symptoms. Pt will benefit from continued skilled physical therapy services to decrease pain, improve strength and function.    Personal Factors and Comorbidities Comorbidity 3+;Finances;Education;Fitness;Past/Current Experience;Social Background;Time since onset of injury/illness/exacerbation;Transportation    Comorbidities Anxiety,  hx of falls, cerebral palsy, depression, DM, HTN    Examination-Activity Limitations Squat;Stairs;Locomotion Level;Stand;Transfers;Carry    Stability/Clinical Decision Making Stable/Uncomplicated    Clinical Decision Making Low    Rehab Potential Fair    PT Frequency 2x / week    PT Duration 8 weeks    PT Treatment/Interventions Therapeutic activities;Therapeutic exercise;Balance training;Electrical Stimulation;Iontophoresis 63m/ml Dexamethasone;Gait training;Neuromuscular re-education;Patient/family education;Manual techniques;Dry needling    PT Next Visit Plan trunk, hip and knee strengthening, femoral control, manual techniques, modalities PRN    Consulted and Agree with Plan of Care Patient             Patient will benefit from skilled therapeutic intervention in order to improve the following deficits and impairments:  Pain, Postural dysfunction, Improper body mechanics, Difficulty walking, Decreased strength, Decreased endurance, Decreased balance, Decreased activity tolerance, Abnormal gait  Visit Diagnosis: Chronic pain of right knee  Muscle weakness (generalized)  Difficulty in walking, not elsewhere classified     Problem List Patient Active Problem List   Diagnosis Date Noted   No-show for appointment 12/22/2020   Obesity due to excess calories 09/22/2020  Nausea 09/20/2020   Sinusitis 08/09/2020   Insomnia due to medical condition 05/11/2020   COPD (chronic obstructive pulmonary disease) ([place]) 03/22/2020   Hamstring tendonitis 01/29/2020   MDD (major depressive disorder), recurrent, in full remission ([place]) 08/12/2019   Seizure-like activity ([place]) 07/08/2019   Mild episode of recurrent major depressive disorder ([place]) 12/29/2018   Insomnia due to mental condition 12/29/2018   Disorder of vein 03/04/2018   Lumbar sprain 03/04/2018   Muscle weakness 03/04/2018   Neck sprain 03/04/2018   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   Postmenopausal bleeding 02/18/2018    Impingement syndrome of shoulder region 02/04/2018   Chest pain with moderate risk for cardiac etiology 05/19/2017   History of depression 04/15/2017   GAD (generalized anxiety disorder) 04/15/2017   Chronic daily headache 04/15/2017   Panic attack 04/15/2017   Nocturnal enuresis 04/15/2017   Mild cognitive impairment 04/15/2017   Loss of memory 01/14/2017   Pain medication agreement signed 01/08/2017   Headache disorder 12/04/2016   Chronic, continuous use of opioids 07/01/2015   Vertigo 07/01/2015   Chronic midline low back pain without sciatica 03/21/2015   Acute renal failure (ARF) ([place]) 02/19/2015   Type 2 diabetes mellitus without complication ([place]) 01/41/0301   Acute pancreatitis 09/04/2014   Cerebral palsy ([place]) 01/05/2014   Cerebral palsy with spastic/ataxic diplegia 01/05/2014   Hip pain, chronic 04/28/2013   Essential hypertension 01/10/2013   Irritable colon 01/10/2013   Noninfectious gastroenteritis and colitis 01/10/2013   Encounter for long-term (current) use of other medications 12/04/2012   Chronic GERD 08/12/2012   Epigastric pain 08/12/2012   Diarrhea 08/12/2012   Primary localized osteoarthrosis, lower leg 05/07/2012   Difficulty walking 02/06/2012    Joneen Boers PT, DPT   07/03/2021, 4:59 PM  [place] [facility] [address] [place], [address] Phone: [phone]   Fax:  [phone]  Name: Traci Davis MRN: [id number] Date of Birth: 1971/01/28

## 2021-07-05 ENCOUNTER — Other Ambulatory Visit: Payer: Self-pay | Admitting: Psychiatry

## 2021-07-05 ENCOUNTER — Other Ambulatory Visit: Payer: Self-pay

## 2021-07-05 ENCOUNTER — Ambulatory Visit: Payer: Medicare Other

## 2021-07-05 DIAGNOSIS — G8929 Other chronic pain: Secondary | ICD-10-CM

## 2021-07-05 DIAGNOSIS — M6281 Muscle weakness (generalized): Secondary | ICD-10-CM

## 2021-07-05 DIAGNOSIS — R262 Difficulty in walking, not elsewhere classified: Secondary | ICD-10-CM

## 2021-07-05 DIAGNOSIS — F411 Generalized anxiety disorder: Secondary | ICD-10-CM

## 2021-07-05 DIAGNOSIS — F3342 Major depressive disorder, recurrent, in full remission: Secondary | ICD-10-CM

## 2021-07-05 DIAGNOSIS — M25561 Pain in right knee: Secondary | ICD-10-CM | POA: Diagnosis not present

## 2021-07-05 DIAGNOSIS — G4701 Insomnia due to medical condition: Secondary | ICD-10-CM

## 2021-07-05 NOTE — Therapy (Signed)
[place] [facility] [address], [place], [address] Phone: [phone]   Fax:  [phone]  Physical Therapy Treatment And Discharge Summary  Patient Details  Name: Traci Davis MRN: [id number] Date of Birth: [birth year] Referring Provider (PT): Vonna Kotyk, MD   Encounter Date: 07/05/2021   PT End of Session - 07/05/21 1328     Visit Number 12    Number of Visits 17    Date for PT Re-Evaluation 07/06/21    PT Start Time 4315    PT Stop Time 1401    PT Time Calculation (min) 33 min    Equipment Utilized During Treatment Other (comment)   rw, L KAFO   Activity Tolerance Patient tolerated treatment well    Behavior During Therapy [facility] for tasks assessed/performed             Past Medical History:  Diagnosis Date   Anxiety    Arthritis    Asthma    Cerebral palsy ([place])    Complication of anesthesia    panic attacks before surgery   COPD (chronic obstructive pulmonary disease) ([place])    Depression    Diabetes mellitus without complication ([place])    GERD (gastroesophageal reflux disease)    Hypertension     Past Surgical History:  Procedure Laterality Date   [place] N/A 02/20/2018   Procedure: DILATATION & CURETTAGE/HYSTEROSCOPY WITH MYOSURE ENDOMETRIAL POLYPECTOMY;  Surgeon: Will Bonnet, MD;  Location: [facility];  Service: Gynecology;  Laterality: N/A;   ESOPHAGOGASTRODUODENOSCOPY (EGD) WITH PROPOFOL N/A 01/01/2019   Procedure: ESOPHAGOGASTRODUODENOSCOPY (EGD) WITH PROPOFOL;  Surgeon: Lin Landsman, MD;  Location: [facility];  Service: Gastroenterology;  Laterality: N/A;   FOOT CAPSULE RELEASE W/ PERCUTANEOUS HEEL CORD LENGTHENING, TIBIAL TENDON TRANSFER     age 19 ,and 2010-both legs   JOINT REPLACEMENT Right    both knees partial replacements dates unknown   knee replacment     x 5 per  pt. last one- left knee 2014. has had 2 on R 3 on L   TYMPANOPLASTY WITH GRAFT Right 01/08/2018   Procedure: TYMPANOPLASTY WITH POSSIBLE OSSICULAR CHAIN RECONSTRUCTION;  Surgeon: [name], MD;  Location: [facility];  Service: ENT;  Laterality: Right;    There were no vitals filed for this visit.   Subjective Assessment - 07/05/21 1330     Subjective R knee is doing good. Got to keep working on it. Shoulders are giving her a fit again. Probably from using the walker a lot. No knee pain currently. Feels her R quads. Feels like she can graduate PT today and continue with her exercises at home.    Pertinent History R lateral knee pain. Was told that her pain was due to her Iliotibial band. Has R lateral knee and lateral patellar soreness. R LE is the stronger leg due to cerebral palsy L LE. L LE needs KAFO. R knee feels like it buckles at times when she first gets up off the bed. R knee cap feels like it pops up. Pt has to straighten out her leg a little prior to stand up from sitting (standing up from a 90 degree knee flexed position makes her knee buckle. Knee pain has been occuring almost a year, at least 9 months.Had home health PT for 3 visits which helped decrease R knee pain. Falls is a regular occurrence per pt.  Pt states R lateral hip has been bothering her as well.    Limitations Lifting;House hold activities;Other (comment);Walking;Standing   sleeping   Patient Stated Goals Decrease pain. Wants to get back to walking outside.    Currently in Pain? No/denies    Pain Onset More than a month ago   October 2018                                       PT Education - 07/05/21 1331     Education Details ther-ex    [employer]) Educated Patient    Methods Explanation;Demonstration;Tactile cues;Verbal cues    Comprehension Returned demonstration;Verbalized understanding           Objective    Medbridge Access Code [email or web address]    Therapeutic exercise   Seated  manually resisted B hip flexion, hip extension, abduction and R hip ER 1-2x each   Reviewed progress with PT towards goals.    Seated knee extension              L 10x3 with 5 second holds with PT assist to end range             R 10x5 seconds for 2 sets    Seated leg press blue band              R 10x. PT holding band for pt.              L 10x. PT holding band for pt.      Improved exercise technique, movement at target joints, use of target muscles after mod verbal, visual, tactile cues.        Manual therapy Seated STM R lateral hamstrings, vastus lateralis, IT band to decrease fascial restrictions and muscle tension.        Accompanied pt from clinic to car for safety due to fall risk.         Response to treatment Pt tolerated session well without aggravation of symptoms.         Clinical impression Pt demonstrates decreased R knee pain, improved B hip strength and function since initial evaluation. Pt has made very good progress with PT towards goals and demonstrates consistency and independence with her HEP. Skilled physical therapy services discharged with pt continuing her progress with her exercises at home.        PT Short Term Goals - 06/21/21 1332       PT SHORT TERM GOAL #1   Title Pt will be independent with her initial HEP to decrease pain, improve strength and ability to perform standing tasks more comfortably.    Baseline Pt has not yet started her HEP (05/10/2021); No questions with her exercises, able to do them at home (06/21/2021)    Time 3    Period Weeks    Status Achieved    Target Date 06/01/21               PT Long Term Goals - 07/05/21 1333       PT LONG TERM GOAL #1   Title Pt will have a decrease in R knee pain to 5/10 or less at worst to promote ability to ambulate, as well as perform standing tasks more comfortably.    Baseline 9/10 R knee pain at worst for the past 3 months (05/10/2021); 7/10 R knee pain at most for the past  7  days (06/21/2021); 6/10 R knee pain at most for the past 7 days (07/03/2021)    Time 8    Period Weeks    Status Partially Met    Target Date 07/06/21      PT LONG TERM GOAL #2   Title Pt will improve bilateral hip flexion, extension, abduction and R hip ER strength by at least 1/2 MMT grade to promote ability to ambulate with less difficulty, and more comfortably for her R knee.    Baseline Hip flexion 4-/5 R, 3-/5 L, hip extension 4/5 R 3/5 L, hip abduction 4-/5 R, 3+/5 L, R hip ER 4-/5 (05/10/2021);  hip flexion 4/5 R, 4-/5 L, hip extension 5/5 R, 4+/5 L, hip abduction 4-/5 R, 3+/5 L, Hip ER 4-/5 (06/21/2021); hip flexion 4+/5 R, 4/5 L, hip extension 5/5 R, 5/5 L, hip abduction 4/5 R, 4-/5 L, R hip ER 4/5 R (07/05/2021)    Time 8    Period Weeks    Status Achieved    Target Date 07/06/21      PT LONG TERM GOAL #3   Title Pt will improve her R knee FOTO score by at least 10 points as a demonstration of improved function.    Baseline R knee FOTO 41 (05/10/2021); 44 (06/21/2021); 47 (07/05/2021)    Time 8    Period Weeks    Status Partially Met    Target Date 07/06/21                   Plan - 07/05/21 1310     Clinical Impression Statement Pt demonstrates decreased R knee pain, improved B hip strength and function since initial evaluation. Pt has made very good progress with PT towards goals and demonstrates consistency and independence with her HEP. Skilled physical therapy services discharged with pt continuing her progress with her exercises at home.    Personal Factors and Comorbidities Comorbidity 3+;Finances;Education;Fitness;Past/Current Experience;Social Background;Time since onset of injury/illness/exacerbation;Transportation    Comorbidities Anxiety, hx of falls, cerebral palsy, depression, DM, HTN    Examination-Activity Limitations Squat;Stairs;Locomotion Level;Stand;Transfers;Carry    Stability/Clinical Decision Making Stable/Uncomplicated    Clinical Decision Making Low     Rehab Potential --    PT Frequency --    PT Duration --    PT Treatment/Interventions Therapeutic activities;Therapeutic exercise;Balance training;Gait training;Neuromuscular re-education;Patient/family education;Manual techniques    PT Next Visit Plan Continue progress with her exercises at home    PT [place] and Agree with Plan of Care Patient             Patient will benefit from skilled therapeutic intervention in order to improve the following deficits and impairments:  Pain, Postural dysfunction, Improper body mechanics, Difficulty walking, Decreased strength, Decreased endurance, Decreased balance, Decreased activity tolerance, Abnormal gait  Visit Diagnosis: Chronic pain of right knee  Muscle weakness (generalized)  Difficulty in walking, not elsewhere classified     Problem List Patient Active Problem List   Diagnosis Date Noted   No-show for appointment 12/22/2020   Obesity due to excess calories 09/22/2020   Nausea 09/20/2020   Sinusitis 08/09/2020   Insomnia due to medical condition 05/11/2020   COPD (chronic obstructive pulmonary disease) ([place]) 03/22/2020   Hamstring tendonitis 01/29/2020   MDD (major depressive disorder), recurrent, in full remission ([place]) 08/12/2019   Seizure-like activity ([place]) 07/08/2019   Mild episode of recurrent major depressive disorder ([place]) 12/29/2018   Insomnia due to mental  condition 12/29/2018   Disorder of vein 03/04/2018   Lumbar sprain 03/04/2018   Muscle weakness 03/04/2018   Neck sprain 03/04/2018   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   Postmenopausal bleeding 02/18/2018   Impingement syndrome of shoulder region 02/04/2018   Chest pain with moderate risk for cardiac etiology 05/19/2017   History of depression 04/15/2017   GAD (generalized anxiety disorder) 04/15/2017   Chronic daily headache 04/15/2017   Panic attack 04/15/2017   Nocturnal enuresis 04/15/2017   Mild  cognitive impairment 04/15/2017   Loss of memory 01/14/2017   Pain medication agreement signed 01/08/2017   Headache disorder 12/04/2016   Chronic, continuous use of opioids 07/01/2015   Vertigo 07/01/2015   Chronic midline low back pain without sciatica 03/21/2015   Acute renal failure (ARF) ([place]) 02/19/2015   Type 2 diabetes mellitus without complication ([place]) 02/40/9735   Acute pancreatitis 09/04/2014   Cerebral palsy ([place]) 01/05/2014   Cerebral palsy with spastic/ataxic diplegia 01/05/2014   Hip pain, chronic 04/28/2013   Essential hypertension 01/10/2013   Irritable colon 01/10/2013   Noninfectious gastroenteritis and colitis 01/10/2013   Encounter for long-term (current) use of other medications 12/04/2012   Chronic GERD 08/12/2012   Epigastric pain 08/12/2012   Diarrhea 08/12/2012   Primary localized osteoarthrosis, lower leg 05/07/2012   Difficulty walking 02/06/2012    Thank you for your referral.  Joneen Boers PT, DPT  07/05/2021, 6:39 PM  [place] [facility] [address] [place], [address] Phone: [phone]   Fax:  [phone]  Name: MAECIE SEVCIK MRN: [id number] Date of Birth: 27-Jun-1970

## 2021-07-26 ENCOUNTER — Other Ambulatory Visit: Payer: Self-pay | Admitting: Psychiatry

## 2021-07-26 DIAGNOSIS — G4701 Insomnia due to medical condition: Secondary | ICD-10-CM

## 2021-07-26 DIAGNOSIS — F411 Generalized anxiety disorder: Secondary | ICD-10-CM

## 2021-07-26 DIAGNOSIS — F3342 Major depressive disorder, recurrent, in full remission: Secondary | ICD-10-CM

## 2021-08-14 ENCOUNTER — Telehealth: Payer: Self-pay

## 2021-08-14 ENCOUNTER — Telehealth (INDEPENDENT_AMBULATORY_CARE_PROVIDER_SITE_OTHER): Payer: Self-pay | Admitting: Psychiatry

## 2021-08-14 DIAGNOSIS — G4701 Insomnia due to medical condition: Secondary | ICD-10-CM

## 2021-08-14 DIAGNOSIS — F3342 Major depressive disorder, recurrent, in full remission: Secondary | ICD-10-CM

## 2021-08-14 DIAGNOSIS — F411 Generalized anxiety disorder: Secondary | ICD-10-CM

## 2021-08-14 DIAGNOSIS — Z91199 Patient's noncompliance with other medical treatment and regimen due to unspecified reason: Secondary | ICD-10-CM

## 2021-08-14 MED ORDER — MIRTAZAPINE 15 MG PO TABS: 15.0000 mg | ORAL_TABLET | Freq: Every day | ORAL | 0 refills | Status: DC

## 2021-08-14 NOTE — Progress Notes (Signed)
No response to call or text or video invite  

## 2021-08-14 NOTE — Telephone Encounter (Signed)
Prozac-patient has refills. ? ?Mirtazapine-30-day supply sent to [facility]. ?

## 2021-08-14 NOTE — Telephone Encounter (Signed)
pt states she couldn't get it to connect. she was made an appt for 09-18-21 @ 3:30 in person.  pt needs enough medications to get to her appt.  ?

## 2021-08-29 DIAGNOSIS — T50905A Adverse effect of unspecified drugs, medicaments and biological substances, initial encounter: Secondary | ICD-10-CM | POA: Insufficient documentation

## 2021-09-14 ENCOUNTER — Telehealth: Payer: Self-pay | Admitting: Psychiatry

## 2021-09-14 ENCOUNTER — Ambulatory Visit: Payer: Medicare Other | Admitting: Psychiatry

## 2021-09-18 ENCOUNTER — Ambulatory Visit: Payer: Medicare Other | Admitting: Psychiatry

## 2021-10-11 ENCOUNTER — Other Ambulatory Visit: Payer: Self-pay | Admitting: Psychiatry

## 2021-10-11 DIAGNOSIS — F3342 Major depressive disorder, recurrent, in full remission: Secondary | ICD-10-CM

## 2021-10-11 DIAGNOSIS — G4701 Insomnia due to medical condition: Secondary | ICD-10-CM

## 2021-10-11 DIAGNOSIS — F411 Generalized anxiety disorder: Secondary | ICD-10-CM

## 2021-10-16 ENCOUNTER — Ambulatory Visit (INDEPENDENT_AMBULATORY_CARE_PROVIDER_SITE_OTHER): Payer: Medicare Other | Admitting: Psychiatry

## 2021-10-16 ENCOUNTER — Other Ambulatory Visit: Payer: Self-pay | Admitting: Psychiatry

## 2021-10-16 ENCOUNTER — Encounter: Payer: Self-pay | Admitting: Psychiatry

## 2021-10-16 VITALS — BP 148/78 | HR 102 | Temp 98.3°F | Wt 206.8 lb

## 2021-10-16 DIAGNOSIS — F3342 Major depressive disorder, recurrent, in full remission: Secondary | ICD-10-CM | POA: Diagnosis not present

## 2021-10-16 DIAGNOSIS — F411 Generalized anxiety disorder: Secondary | ICD-10-CM

## 2021-10-16 DIAGNOSIS — G4701 Insomnia due to medical condition: Secondary | ICD-10-CM

## 2021-10-16 MED ORDER — FLUOXETINE HCL 40 MG PO CAPS: 40.0000 mg | ORAL_CAPSULE | Freq: Every day | ORAL | 1 refills | Status: DC

## 2021-10-16 NOTE — Progress Notes (Unsigned)
[place] MD OP Progress Note  10/16/2021 5:21 PM Traci Davis  MRN:  [id number]  Chief Complaint:  Chief Complaint  Patient presents with   Follow-up: 51 year old Caucasian female with history of MDD, GAD, insomnia, presented for medication management.   HPI: Traci Davis is a 51 year old Caucasian female, lives in [facility], has a history of MDD, GAD, insomnia, cerebral palsy, diabetes mellitus, on SSD was evaluated in office today.  Patient today reports she continues to struggle with anxiety due to her situational stressors.  Patient reports her boyfriend is currently going through chemotherapy for cancer.  He does need a lot of support.  Patient reports he does get frustrated quickly likely due to going through his own health issues.  That does affect her mood as well.  Patient continues to struggle with chronic pain, looks forward to getting Botox injection which she takes for muscle spasms.  Patient reports she is interested in getting back in the gym at the [facility].  She is worried about her weight gain.  Patient reports sleep is overall okay.  Patient does report increased appetite likely due to her current psychosocial stressors.  She however is not interested in medication management since she believes she is already taking a lot of medications.  Would like to work on it herself.  Currently compliant on her medications.  Denies any suicidality, homicidality or perceptual disturbances.  Patient denies any other concerns today.  Visit Diagnosis:    ICD-10-CM   1. MDD (major depressive disorder), recurrent, in full remission ([place])  F33.42     2. GAD (generalized anxiety disorder)  F41.1 FLUoxetine (PROZAC) 40 MG capsule    3. Insomnia due to medical condition  G47.01    pain, mood      Past Psychiatric History: Reviewed past psychiatric history from progress note on 09/12/2017.  Past trials of Xanax, Klonopin, Cymbalta, Rozerem, Ambien, trazodone, doxepin.  Past Medical  History:  Past Medical History:  Diagnosis Date   Anxiety    Arthritis    Asthma    Cerebral palsy ([place])    Complication of anesthesia    panic attacks before surgery   COPD (chronic obstructive pulmonary disease) ([place])    Depression    Diabetes mellitus without complication ([place])    GERD (gastroesophageal reflux disease)    Hypertension     Past Surgical History:  Procedure Laterality Date   CESAREAN SECTION  1995   CHOLECYSTECTOMY     DILATATION & CURETTAGE/HYSTEROSCOPY WITH MYOSURE N/A 02/20/2018   Procedure: DILATATION & CURETTAGE/HYSTEROSCOPY WITH MYOSURE ENDOMETRIAL POLYPECTOMY;  Surgeon: Will Bonnet, MD;  Location: [facility];  Service: Gynecology;  Laterality: N/A;   ESOPHAGOGASTRODUODENOSCOPY (EGD) WITH PROPOFOL N/A 01/01/2019   Procedure: ESOPHAGOGASTRODUODENOSCOPY (EGD) WITH PROPOFOL;  Surgeon: Lin Landsman, MD;  Location: [facility];  Service: Gastroenterology;  Laterality: N/A;   FOOT CAPSULE RELEASE W/ PERCUTANEOUS HEEL CORD LENGTHENING, TIBIAL TENDON TRANSFER     age 12 ,and 2010-both legs   JOINT REPLACEMENT Right    both knees partial replacements dates unknown   knee replacment     x 5 per pt. last one- left knee 2014. has had 2 on R 3 on L   TYMPANOPLASTY WITH GRAFT Right 01/08/2018   Procedure: TYMPANOPLASTY WITH POSSIBLE OSSICULAR CHAIN RECONSTRUCTION;  Surgeon: [name], MD;  Location: [facility];  Service: ENT;  Laterality: Right;    Family Psychiatric History: Reviewed family psychiatric history from progress note on 09/12/2017.  Family History:  Family History  Problem Relation Age of Onset   CAD Father    Heart attack Brother    Sexual abuse Paternal Grandfather    Breast cancer Neg Hx     Social History: Reviewed social history from progress note on 09/12/2017. Social History   Socioeconomic History   Marital status: Single    Spouse name: Not on file   Number of children: 1   Years of education: Not on file   Highest  education level: Associate degree: occupational, Hotel manager, or vocational program  Occupational History   Not on file  Tobacco Use   Smoking status: Never   Smokeless tobacco: Never  Vaping Use   Vaping Use: Never used  Substance and Sexual Activity   Alcohol use: No   Drug use: No   Sexual activity: Yes    Partners: Male    Birth control/protection: Condom  Other Topics Concern   Not on file  Social History Narrative   Not on file   Social Determinants of Health   Financial Resource Strain: Not on file  Food Insecurity: Not on file  Transportation Needs: Not on file  Physical Activity: Not on file  Stress: Not on file  Social Connections: Not on file    Allergies:  Allergies  Allergen Reactions   Meloxicam Other (See Comments)    Damage kidney   Morphine And Related Other (See Comments)    hallucinations   Vantin [Cefpodoxime] Nausea And Vomiting   Latex Itching   Baclofen Other (See Comments)    "makes cerebral palsy do adverse reaction on me", tightens muscles    Ciprofloxacin Itching and Nausea And Vomiting   Tape Rash    skin tears.  Paper tape is ok   Tramadol Nausea Only    Metabolic Disorder Labs: Lab Results  Component Value Date   HGBA1C 6.1 (H) 02/19/2015   No results found for: "PROLACTIN" No results found for: "CHOL", "TRIG", "HDL", "CHOLHDL", "VLDL", "LDLCALC" Lab Results  Component Value Date   TSH 2.198 07/10/2016   TSH 2.891 02/19/2015    Therapeutic Level Labs: No results found for: "LITHIUM" No results found for: "VALPROATE" No results found for: "CBMZ"  Current Medications: Current Outpatient Medications  Medication Sig Dispense Refill   acetaminophen (TYLENOL) 325 MG tablet Take 2 tablets (650 mg total) by mouth every 6 (six) hours as needed. 60 tablet 0   albuterol (PROVENTIL HFA;VENTOLIN HFA) 108 (90 BASE) MCG/ACT inhaler Inhale 2 puffs into the lungs 4 (four) times daily as needed. For shortness of breath and/or wheezing      ANORO ELLIPTA 62.5-25 MCG/INH AEPB      atorvastatin (LIPITOR) 20 MG tablet Take 1 tablet by mouth daily.     Blood Glucose Monitoring Suppl (GLUCOCOM BLOOD GLUCOSE MONITOR) DEVI Test daily before all meals/snacks and once before bedtime.     chlorzoxazone (PARAFON) 500 MG tablet Take 500 mg by mouth 3 (three) times daily.     cloNIDine (CATAPRES - DOSED IN MG/24 HR) 0.1 mg/24hr patch Place 1 patch (0.1 mg total) onto the skin once a week. 4 patch 11   Continuous Blood Gluc Receiver (DEXCOM G6 RECEIVER) DEVI 1 Units by Miscellaneous route continuous.     Continuous Blood Gluc Transmit (DEXCOM G6 TRANSMITTER) MISC 1 Units by Miscellaneous route Every three (3) months.     CREON 36000 units CPEP capsule      cyclobenzaprine (FLEXERIL) 10 MG tablet Take by mouth.     diazepam (VALIUM)  5 MG tablet Take by mouth.     diclofenac Sodium (VOLTAREN) 1 % GEL Apply topically.     dicyclomine (BENTYL) 10 MG capsule Take by mouth.     donepezil (ARICEPT) 10 MG tablet Take by mouth.     fluconazole (DIFLUCAN) 150 MG tablet Take 150 mg by mouth once.     furosemide (LASIX) 40 MG tablet Take 40 mg by mouth daily as needed for fluid.      glimepiride (AMARYL) 4 MG tablet Take 4 mg by mouth daily with breakfast.     GLOBAL EASE INJECT PEN NEEDLES 31G X 5 MM MISC Inject into the skin.     glucose blood test strip USE TO TEST BLOOD SUGAR TWO TIMES A DAY     incobotulinumtoxinA (XEOMIN) 100 units SOLR injection Inject 100 Units into the muscle every 3 (three) months.      insulin glargine (LANTUS) 100 UNIT/ML Solostar Pen Inject into the skin.     Insulin Pen Needle (PEN NEEDLES 31GX5/16") 31G X 8 MM MISC Injection Frequency is 1 time per day; Dx Code: Type 2 Diabetes uncontrolled (E11.65)     Lancets (FREESTYLE) lancets Test daily before all meals/snacks     LANTUS SOLOSTAR 100 UNIT/ML Solostar Pen Inject into the skin.     lidocaine (LIDODERM) 5 % Place onto the skin.     LINZESS 145 MCG CAPS capsule Take 1  capsule by mouth every morning  for constipation     lisinopril (ZESTRIL) 20 MG tablet Take 1 tablet by mouth daily.     meclizine (ANTIVERT) 25 MG tablet Take by mouth.     Melatonin 10 MG TBCR Take by mouth.     metFORMIN (GLUCOPHAGE-XR) 500 MG 24 hr tablet Take by mouth.     metFORMIN (GLUCOPHAGE-XR) 500 MG 24 hr tablet Take by mouth.     mirtazapine (REMERON) 15 MG tablet Take 1 tablet (15 mg total) by mouth at bedtime. For sleep 30 tablet 0   montelukast (SINGULAIR) 10 MG tablet Take 1 tablet by mouth daily.     naloxone (NARCAN) nasal spray 4 mg/0.1 mL One spray in either nostril once for known/suspected opioid overdose. May repeat every 2-3 minutes in alternating nostril til EMS arrives     nystatin (MYCOSTATIN/NYSTOP) powder Apply topically.     omeprazole (PRILOSEC) 40 MG capsule Take by mouth.     ondansetron (ZOFRAN) 4 MG tablet      oxyCODONE (OXY IR/ROXICODONE) 5 MG immediate release tablet Take 5 mg by mouth 4 (four) times daily as needed.     oxyCODONE-acetaminophen (PERCOCET/ROXICET) 5-325 MG tablet Take by mouth.     PAXLOVID, 300/100, 20 x 150 MG & 10 x '100MG'$  TBPK SMARTSIG:3 By Mouth Twice Daily     predniSONE (DELTASONE) 20 MG tablet Take 20 mg by mouth 2 (two) times daily.     promethazine (PHENERGAN) 25 MG tablet Take by mouth.     solifenacin (VESICARE) 10 MG tablet Take 10 mg by mouth daily.     XTAMPZA ER 13.5 MG C12A Take by mouth daily as needed.     FLUoxetine (PROZAC) 40 MG capsule Take 1 capsule (40 mg total) by mouth daily. 90 capsule 1   fluticasone (FLONASE) 50 MCG/ACT nasal spray 1 spray into each nostril two (2) times a day.     HYDROmorphone (DILAUDID) 2 MG tablet Take 2 mg by mouth 4 (four) times daily. (Patient not taking: Reported on 10/16/2021)  LIDOPRO 4-4-5 % PTCH 1 patch patch every twelve hours (Patient not taking: Reported on 10/16/2021)     metFORMIN (GLUCOPHAGE) 500 MG tablet Take by mouth.     mirabegron ER (MYRBETRIQ) 25 MG TB24 tablet Take 1  tablet by mouth daily.     montelukast (SINGULAIR) 10 MG tablet Take 10 mg by mouth daily.     pregabalin (LYRICA) 50 MG capsule Take 50 mg by mouth 3 (three) times daily.     No current facility-administered medications for this visit.     Musculoskeletal: Strength & Muscle Tone:  Limited in her lower extremities - Rt sided , hx of cerebral palsy Gait & Station:  walks with walker  Patient leans: Front  Psychiatric Specialty Exam: Review of Systems  Musculoskeletal:  Positive for arthralgias, back pain and myalgias.  Psychiatric/Behavioral:  The patient is nervous/anxious.   All other systems reviewed and are negative.   Blood pressure (!) 148/78, pulse (!) 102, temperature 98.3 F (36.8 C), temperature source Temporal, weight 206 lb 12.8 oz (93.8 kg), last menstrual period 08/20/2014.Body mass index is 37.82 kg/m.  General Appearance: Casual  Eye Contact:  Good  Speech:  Clear and Coherent  Volume:  Normal  Mood:  Anxious  Affect:  Congruent  Thought Process:  Goal Directed and Descriptions of Associations: Intact  Orientation:  Full (Time, Place, and Person)  Thought Content: Logical   Suicidal Thoughts:  No  Homicidal Thoughts:  No  Memory:  Immediate;   Fair Recent;   Fair Remote;   Fair  Judgement:  Fair  Insight:  Fair  Psychomotor Activity:  Normal  Concentration:  Concentration: Fair and Attention Span: Fair  Recall:  [employer] of Knowledge: Fair  Language: Fair  Akathisia:  No  Handed:  Right  AIMS (if indicated): done  Assets:  Communication Skills Desire for Improvement Housing Social Support  ADL's:  Intact  Cognition: WNL  Sleep:  Fair   Screenings: GAD-7    Flowsheet Row Video Visit from 06/06/2021 in [facility]  Total GAD-7 Score 1      PHQ2-9    [place] Visit from 10/16/2021 in [place] Video Visit from 06/06/2021 in [facility]  Visit from 05/23/2021 in [place] Visit from 08/30/2020 in [place] Video Visit from 06/30/2020 in [facility]  PHQ-2 Total Score 1 0 0 0 0  PHQ-9 Total Score 5 -- -- -- --      Flowsheet Row Video Visit from 06/06/2021 in [place] Visit from 08/30/2020 in [place] Video Visit from 06/30/2020 in [place] No Risk Error: Question 6 not populated No Risk        Assessment and Plan: DEANNA WIATER is a 51 year old Caucasian female who has a history of depression, GAD, chronic pain was evaluated in office today.  Patient went psychosocial stressors of boyfriend with cancer diagnosis which does have an impact on her mood.  Discussed plan as noted below.  Plan MDD in remission Continue Prozac 40 mg p.o. daily  GAD-unstable Discussed increasing Prozac however she is not interested. Discussed with patient to establish care with therapist.  Insomnia-improving Continue mirtazapine 15 mg p.o. nightly. Melatonin 10 mg p.o. nightly  Follow-up in clinic in 4 weeks or sooner if needed.   This note was generated in part or whole with  voice recognition software. Voice recognition is usually quite accurate but there are transcription errors that can and very often do occur. I apologize for any typographical errors that were not detected and corrected.     Ursula Alert, MD 10/16/2021, 5:21 PM

## 2021-11-13 ENCOUNTER — Encounter: Payer: Self-pay | Admitting: Psychiatry

## 2021-11-13 ENCOUNTER — Telehealth (INDEPENDENT_AMBULATORY_CARE_PROVIDER_SITE_OTHER): Payer: Medicare Other | Admitting: Psychiatry

## 2021-11-13 DIAGNOSIS — F411 Generalized anxiety disorder: Secondary | ICD-10-CM

## 2021-11-13 DIAGNOSIS — G4701 Insomnia due to medical condition: Secondary | ICD-10-CM

## 2021-11-13 DIAGNOSIS — F3342 Major depressive disorder, recurrent, in full remission: Secondary | ICD-10-CM | POA: Diagnosis not present

## 2021-11-13 MED ORDER — MIRTAZAPINE 15 MG PO TABS: 15.0000 mg | ORAL_TABLET | Freq: Every day | ORAL | 1 refills | Status: DC

## 2021-11-13 NOTE — Progress Notes (Unsigned)
Virtual Visit via Video Note  I connected with Traci Davis on 11/13/21 at  3:30 PM EDT by a video enabled telemedicine application and verified that I am speaking with the correct person using two identifiers.  Location Provider Location : [facility] Patient Location : Home  Participants: Patient , Provider    I discussed the limitations of evaluation and management by telemedicine and the availability of in person appointments. The patient expressed understanding and agreed to proceed.  I discussed the assessment and treatment plan with the patient. The patient was provided an opportunity to ask questions and all were answered. The patient agreed with the plan and demonstrated an understanding of the instructions.   The patient was advised to call back or seek an in-person evaluation if the symptoms worsen or if the condition fails to improve as anticipated.    [facility] MD OP Progress Note  11/14/2021 8:25 AM Traci Davis  MRN:  [id number]  Chief Complaint:  Chief Complaint  Patient presents with   Follow-up: 51 year old Caucasian female with history of depression, anxiety, insomnia, presented for medication management.   HPI: Traci Davis is a 51 year old Caucasian female, lives in [facility], has a history of MDD, GAD, insomnia, cerebral palsy, diabetes mellitus on SSD was evaluated by telemedicine today.  Patient today reports she does struggle with relationship struggles with her boyfriend who is currently struggling with cancer.  Patient reports although he is getting better he does not interact with her and that worries her.  Patient however reports she is not interested in referral for psychotherapy and would like to make use of her own support system including her daughter.  Denies any significant depressive symptoms.  Reports sleep is overall good.  Denies any suicidality, homicidality or perceptual disturbances.  Currently compliant on the Prozac and the mirtazapine.   Although as noted above she does have mild anxiety , is not interested in making any medication changes and would like to stay on the same.  Denies any side effects.  Patient denies any other concerns today.  Visit Diagnosis:    ICD-10-CM   1. MDD (major depressive disorder), recurrent, in full remission ([place])  F33.42 mirtazapine (REMERON) 15 MG tablet    2. GAD (generalized anxiety disorder)  F41.1 mirtazapine (REMERON) 15 MG tablet    3. Insomnia due to medical condition  G47.01 mirtazapine (REMERON) 15 MG tablet   pain, mood      Past Psychiatric History: Reviewed past psychiatric history from progress note on 09/12/2017.  Past trials of Xanax, Klonopin, Cymbalta, Rozerem, Ambien, trazodone, doxepin.  Past Medical History:  Past Medical History:  Diagnosis Date   Anxiety    Arthritis    Asthma    Cerebral palsy ([place])    Complication of anesthesia    panic attacks before surgery   COPD (chronic obstructive pulmonary disease) ([place])    Depression    Diabetes mellitus without complication ([place])    GERD (gastroesophageal reflux disease)    Hypertension     Past Surgical History:  Procedure Laterality Date   CESAREAN SECTION  1995   CHOLECYSTECTOMY     DILATATION & CURETTAGE/HYSTEROSCOPY WITH MYOSURE N/A 02/20/2018   Procedure: DILATATION & CURETTAGE/HYSTEROSCOPY WITH MYOSURE ENDOMETRIAL POLYPECTOMY;  Surgeon: Will Bonnet, MD;  Location: [facility];  Service: Gynecology;  Laterality: N/A;   ESOPHAGOGASTRODUODENOSCOPY (EGD) WITH PROPOFOL N/A 01/01/2019   Procedure: ESOPHAGOGASTRODUODENOSCOPY (EGD) WITH PROPOFOL;  Surgeon: Lin Landsman, MD;  Location: [place];  Service:  Gastroenterology;  Laterality: N/A;   FOOT CAPSULE RELEASE W/ PERCUTANEOUS HEEL CORD LENGTHENING, TIBIAL TENDON TRANSFER     age 51 ,and 2010-both legs   JOINT REPLACEMENT Right    both knees partial replacements dates unknown   knee replacment     x 5 per pt. last one- left knee 2014. has had  2 on R 3 on L   TYMPANOPLASTY WITH GRAFT Right 01/08/2018   Procedure: TYMPANOPLASTY WITH POSSIBLE OSSICULAR CHAIN RECONSTRUCTION;  Surgeon: [name], MD;  Location: [facility];  Service: ENT;  Laterality: Right;    Family Psychiatric History: Reviewed family psychiatric history from progress note on 09/12/2017.  Family History:  Family History  Problem Relation Age of Onset   CAD Father    Heart attack Brother    Sexual abuse Paternal Grandfather    Breast cancer Neg Hx     Social History: Reviewed social history from progress note on 09/12/2017. Social History   Socioeconomic History   Marital status: Single    Spouse name: Not on file   Number of children: 1   Years of education: Not on file   Highest education level: Associate degree: occupational, Hotel manager, or vocational program  Occupational History   Not on file  Tobacco Use   Smoking status: Never   Smokeless tobacco: Never  Vaping Use   Vaping Use: Never used  Substance and Sexual Activity   Alcohol use: No   Drug use: No   Sexual activity: Yes    Partners: Male    Birth control/protection: Condom  Other Topics Concern   Not on file  Social History Narrative   Not on file   Social Determinants of Health   Financial Resource Strain: Not on file  Food Insecurity: Not on file  Transportation Needs: Not on file  Physical Activity: Not on file  Stress: Not on file  Social Connections: Not on file    Allergies:  Allergies  Allergen Reactions   Meloxicam Other (See Comments)    Damage kidney   Morphine And Related Other (See Comments)    hallucinations   Vantin [Cefpodoxime] Nausea And Vomiting   Latex Itching   Baclofen Other (See Comments)    "makes cerebral palsy do adverse reaction on me", tightens muscles    Ciprofloxacin Itching and Nausea And Vomiting   Tape Rash    skin tears.  Paper tape is ok   Tramadol Nausea Only    Metabolic Disorder Labs: Lab Results  Component Value Date    HGBA1C 6.1 (H) 02/19/2015   No results found for: "PROLACTIN" No results found for: "CHOL", "TRIG", "HDL", "CHOLHDL", "VLDL", "LDLCALC" Lab Results  Component Value Date   TSH 2.198 07/10/2016   TSH 2.891 02/19/2015    Therapeutic Level Labs: No results found for: "LITHIUM" No results found for: "VALPROATE" No results found for: "CBMZ"  Current Medications: Current Outpatient Medications  Medication Sig Dispense Refill   acetaminophen (TYLENOL) 325 MG tablet Take 2 tablets (650 mg total) by mouth every 6 (six) hours as needed. 60 tablet 0   albuterol (PROVENTIL HFA;VENTOLIN HFA) 108 (90 BASE) MCG/ACT inhaler Inhale 2 puffs into the lungs 4 (four) times daily as needed. For shortness of breath and/or wheezing     amoxicillin (AMOXIL) 875 MG tablet Take 875 mg by mouth 2 (two) times daily.     ANORO ELLIPTA 62.5-25 MCG/INH AEPB      atorvastatin (LIPITOR) 20 MG tablet Take 1 tablet by mouth daily.  Blood Glucose Monitoring Suppl (GLUCOCOM BLOOD GLUCOSE MONITOR) DEVI Test daily before all meals/snacks and once before bedtime.     cloNIDine (CATAPRES - DOSED IN MG/24 HR) 0.1 mg/24hr patch Place 1 patch (0.1 mg total) onto the skin once a week. 4 patch 11   Continuous Blood Gluc Receiver (DEXCOM G6 RECEIVER) DEVI 1 Units by Miscellaneous route continuous.     Continuous Blood Gluc Transmit (DEXCOM G6 TRANSMITTER) MISC 1 Units by Miscellaneous route Every three (3) months.     CREON 36000 units CPEP capsule      cyclobenzaprine (FLEXERIL) 10 MG tablet Take by mouth.     diazepam (VALIUM) 5 MG tablet Take by mouth.     diclofenac Sodium (VOLTAREN) 1 % GEL Apply topically.     dicyclomine (BENTYL) 10 MG capsule Take by mouth.     donepezil (ARICEPT) 10 MG tablet Take by mouth.     fluconazole (DIFLUCAN) 150 MG tablet Take 150 mg by mouth once.     FLUoxetine (PROZAC) 40 MG capsule Take 1 capsule (40 mg total) by mouth daily. 90 capsule 1   fluticasone (FLONASE) 50 MCG/ACT nasal spray 1  spray into each nostril two (2) times a day.     furosemide (LASIX) 40 MG tablet Take 40 mg by mouth daily as needed for fluid.      GLOBAL EASE INJECT PEN NEEDLES 31G X 5 MM MISC Inject into the skin.     glucose blood test strip USE TO TEST BLOOD SUGAR TWO TIMES A DAY     HYDROmorphone (DILAUDID) 2 MG tablet Take 2 mg by mouth 4 (four) times daily.     incobotulinumtoxinA (XEOMIN) 100 units SOLR injection Inject 100 Units into the muscle every 3 (three) months.      insulin glargine (LANTUS) 100 UNIT/ML Solostar Pen Inject into the skin.     Insulin Pen Needle (PEN NEEDLES 31GX5/16") 31G X 8 MM MISC Injection Frequency is 1 time per day; Dx Code: Type 2 Diabetes uncontrolled (E11.65)     Lancets (FREESTYLE) lancets Test daily before all meals/snacks     LANTUS SOLOSTAR 100 UNIT/ML Solostar Pen Inject into the skin.     lidocaine (LIDODERM) 5 % Place onto the skin.     LINZESS 145 MCG CAPS capsule Take 1 capsule by mouth every morning  for constipation     lisinopril (ZESTRIL) 20 MG tablet Take 1 tablet by mouth daily.     meclizine (ANTIVERT) 25 MG tablet Take by mouth.     Melatonin 10 MG TBCR Take by mouth.     metFORMIN (GLUCOPHAGE-XR) 500 MG 24 hr tablet Take 2,000 mg by mouth.     montelukast (SINGULAIR) 10 MG tablet Take 1 tablet by mouth daily.     naloxone (NARCAN) nasal spray 4 mg/0.1 mL One spray in either nostril once for known/suspected opioid overdose. May repeat every 2-3 minutes in alternating nostril til EMS arrives     nystatin (MYCOSTATIN/NYSTOP) powder Apply topically.     omeprazole (PRILOSEC) 40 MG capsule Take by mouth.     ondansetron (ZOFRAN) 4 MG tablet      oxyCODONE-acetaminophen (PERCOCET/ROXICET) 5-325 MG tablet Take by mouth.     pregabalin (LYRICA) 50 MG capsule Take 50 mg by mouth 3 (three) times daily.     promethazine (PHENERGAN) 25 MG tablet Take by mouth.     solifenacin (VESICARE) 10 MG tablet Take 10 mg by mouth daily.     XTAMPZA ER 13.5  MG C12A Take  by mouth daily as needed.     metFORMIN (GLUCOPHAGE-XR) 500 MG 24 hr tablet Take by mouth. (Patient not taking: Reported on 11/13/2021)     mirabegron ER (MYRBETRIQ) 25 MG TB24 tablet Take 1 tablet by mouth daily.     mirtazapine (REMERON) 15 MG tablet Take 1 tablet (15 mg total) by mouth at bedtime. For sleep 90 tablet 1   oxyCODONE (OXY IR/ROXICODONE) 5 MG immediate release tablet Take 5 mg by mouth 4 (four) times daily as needed. (Patient not taking: Reported on 11/13/2021)     PAXLOVID, 300/100, 20 x 150 MG & 10 x '100MG'$  TBPK SMARTSIG:3 By Mouth Twice Daily (Patient not taking: Reported on 11/13/2021)     predniSONE (DELTASONE) 20 MG tablet Take 20 mg by mouth 2 (two) times daily. (Patient not taking: Reported on 11/13/2021)     No current facility-administered medications for this visit.     Musculoskeletal: Strength & Muscle Tone:  UTA Gait & Station:  Seated Patient leans: N/A  Psychiatric Specialty Exam: Review of Systems  Musculoskeletal:  Positive for arthralgias and back pain (chronic).  Neurological:  Positive for headaches.  Psychiatric/Behavioral:  The patient is nervous/anxious.   All other systems reviewed and are negative.   Last menstrual period 08/20/2014.There is no height or weight on file to calculate BMI.  General Appearance: Casual  Eye Contact:  Fair  Speech:  Clear and Coherent  Volume:  Normal  Mood:  Anxious  Affect:  Congruent  Thought Process:  Goal Directed and Descriptions of Associations: Intact  Orientation:  Full (Time, Place, and Person)  Thought Content: Logical   Suicidal Thoughts:  No  Homicidal Thoughts:  No  Memory:  Immediate;   Fair Recent;   Fair Remote;   Fair  Judgement:  Fair  Insight:  Fair  Psychomotor Activity:  Normal  Concentration:  Concentration: Fair and Attention Span: Fair  Recall:  [employer] of Knowledge: Fair  Language: Fair  Akathisia:  No  Handed:  Right  AIMS (if indicated): not done  Assets:  Communication  Skills Desire for Improvement Housing Social Support  ADL's:  Intact  Cognition: WNL  Sleep:  Fair   Screenings: [place] Office Visit from 10/16/2021 in [facility] Total Score 0      GAD-7    Flowsheet Row Video Visit from 06/06/2021 in [facility]  Total GAD-7 Score 1      PHQ2-9    [place] Video Visit from 11/13/2021 in [facility] Visit from 10/16/2021 in [facility] Video Visit from 06/06/2021 in [place] Visit from 05/23/2021 in [place] Office Visit from 08/30/2020 in [facility]  PHQ-2 Total Score 0 1 0 0 0  PHQ-9 Total Score -- 5 -- -- --      Flowsheet Row Video Visit from 11/13/2021 in [facility] from 10/16/2021 in [facility] Video Visit from 06/06/2021 in [place] No Risk No Risk No Risk        Assessment and Plan: Traci Davis is a 51 year old Caucasian female who has a history of depression, anxiety, chronic pain was evaluated by telemedicine today.  Patient is currently struggling with relationship struggles, declines psychotherapy referral, continue medications as noted below.  Plan MDD in remission Prozac 40  mg p.o. daily  GAD-improving Prozac 40 mg p.o. daily Mirtazapine 15 mg p.o. nightly  Insomnia-stable Mirtazapine 15 mg p.o. nightly  Discussed referral for psychotherapy sessions-patient declines.  Follow-up in clinic in 3 months or sooner if needed.   Consent: Patient/Guardian gives verbal consent for treatment and assignment of benefits for services provided during this visit. Patient/Guardian expressed understanding and agreed to proceed.   This note was generated in part or  whole with voice recognition software. Voice recognition is usually quite accurate but there are transcription errors that can and very often do occur. I apologize for any typographical errors that were not detected and corrected.      Ursula Alert, MD 11/14/2021, 8:25 AM

## 2021-11-20 ENCOUNTER — Other Ambulatory Visit: Payer: Self-pay | Admitting: Physician Assistant

## 2021-11-20 DIAGNOSIS — R519 Headache, unspecified: Secondary | ICD-10-CM

## 2021-11-20 DIAGNOSIS — G930 Cerebral cysts: Secondary | ICD-10-CM

## 2021-11-28 ENCOUNTER — Telehealth: Payer: Self-pay

## 2021-11-28 NOTE — Telephone Encounter (Signed)
Could you ask her the dose she is taking? I found both 25 mg daily as needed and 50-100 mg daily as needed, fyi.

## 2021-11-28 NOTE — Telephone Encounter (Signed)
received fax requesting a refill on the hydroxyzine .  pt last see on 11-13-21 next appt 02-15-22   (I dont see where patient is taking this medication?)

## 2021-11-29 NOTE — Telephone Encounter (Signed)
tried to call patient but the phone number listed is no longer in services.

## 2021-11-29 NOTE — Telephone Encounter (Signed)
Attempted to contact patient.  Invalid phone number.

## 2021-11-29 NOTE — Telephone Encounter (Signed)
Will defer to Dr. Shea Evans for further management.

## 2021-11-30 NOTE — Telephone Encounter (Signed)
Based on most recent notes she is no longer taking this medication. But could clarify by contacting patient.Please call patient and let me know. Thank you.

## 2021-11-30 NOTE — Telephone Encounter (Signed)
request for refill on the hydroxyzine

## 2021-12-05 ENCOUNTER — Ambulatory Visit
Admission: RE | Admit: 2021-12-05 | Discharge: 2021-12-05 | Disposition: A | Discharge status: Home / Self Care | Payer: Medicare Other | Source: Ambulatory Visit | Attending: Physician Assistant | Admitting: Physician Assistant

## 2021-12-05 DIAGNOSIS — G930 Cerebral cysts: Secondary | ICD-10-CM | POA: Diagnosis present

## 2021-12-05 DIAGNOSIS — R519 Headache, unspecified: Secondary | ICD-10-CM | POA: Diagnosis present

## 2021-12-05 NOTE — Telephone Encounter (Signed)
Pharmacy was notified.

## 2021-12-22 NOTE — Telephone Encounter (Signed)
Error

## 2022-01-04 ENCOUNTER — Other Ambulatory Visit: Payer: Self-pay | Admitting: Psychiatry

## 2022-01-04 DIAGNOSIS — F411 Generalized anxiety disorder: Secondary | ICD-10-CM

## 2022-01-04 DIAGNOSIS — F3342 Major depressive disorder, recurrent, in full remission: Secondary | ICD-10-CM

## 2022-01-04 DIAGNOSIS — G4701 Insomnia due to medical condition: Secondary | ICD-10-CM

## 2022-02-15 ENCOUNTER — Telehealth (INDEPENDENT_AMBULATORY_CARE_PROVIDER_SITE_OTHER): Payer: Self-pay | Admitting: Psychiatry

## 2022-02-15 DIAGNOSIS — Z91199 Patient's noncompliance with other medical treatment and regimen due to unspecified reason: Secondary | ICD-10-CM

## 2022-02-15 NOTE — Progress Notes (Signed)
No response to call or text or video invite  

## 2022-03-13 ENCOUNTER — Ambulatory Visit: Payer: Medicare Other | Attending: Physician Assistant

## 2022-03-13 DIAGNOSIS — G8929 Other chronic pain: Secondary | ICD-10-CM | POA: Diagnosis present

## 2022-03-13 DIAGNOSIS — M25561 Pain in right knee: Secondary | ICD-10-CM | POA: Insufficient documentation

## 2022-03-13 DIAGNOSIS — R2689 Other abnormalities of gait and mobility: Secondary | ICD-10-CM | POA: Insufficient documentation

## 2022-03-13 DIAGNOSIS — M5459 Other low back pain: Secondary | ICD-10-CM | POA: Diagnosis not present

## 2022-03-13 DIAGNOSIS — R262 Difficulty in walking, not elsewhere classified: Secondary | ICD-10-CM | POA: Diagnosis present

## 2022-03-13 DIAGNOSIS — M542 Cervicalgia: Secondary | ICD-10-CM | POA: Insufficient documentation

## 2022-03-13 DIAGNOSIS — R2681 Unsteadiness on feet: Secondary | ICD-10-CM | POA: Insufficient documentation

## 2022-03-13 DIAGNOSIS — M6281 Muscle weakness (generalized): Secondary | ICD-10-CM | POA: Diagnosis present

## 2022-03-13 DIAGNOSIS — G801 Spastic diplegic cerebral palsy: Secondary | ICD-10-CM | POA: Insufficient documentation

## 2022-03-13 DIAGNOSIS — M545 Low back pain, unspecified: Secondary | ICD-10-CM | POA: Diagnosis present

## 2022-03-13 NOTE — Therapy (Signed)
[facility] [address] [place], [address] Phone: [phone]   Fax:  [phone]  Physical Therapy Evaluation  Patient Details  Name: Traci Davis MRN: [id number] Date of Birth: 06/30/1970 Referring Provider (PT): Leretha Dykes, DO   Encounter Date: 03/13/2022   PT End of Session - 03/13/22 1454     Visit Number 1    Number of Visits 17    Date for PT Re-Evaluation 05/10/22    Authorization Type 1    Authorization Time Period of 10 Progress report    PT Start Time 1416    PT Stop Time 1455    PT Time Calculation (min) 39 min    Equipment Utilized During Treatment --   rw   Activity Tolerance Patient tolerated treatment well    Behavior During Therapy WFL for tasks assessed/performed             Past Medical History:  Diagnosis Date   Anxiety    Arthritis    Asthma    Cerebral palsy ([place])    Complication of anesthesia    panic attacks before surgery   COPD (chronic obstructive pulmonary disease) ([place])    Depression    Diabetes mellitus without complication ([place])    GERD (gastroesophageal reflux disease)    Hypertension     Past Surgical History:  Procedure Laterality Date   [place] N/A 02/20/2018   Procedure: DILATATION & CURETTAGE/HYSTEROSCOPY WITH MYOSURE ENDOMETRIAL POLYPECTOMY;  Surgeon: Will Bonnet, MD;  Location: [facility];  Service: Gynecology;  Laterality: N/A;   ESOPHAGOGASTRODUODENOSCOPY (EGD) WITH PROPOFOL N/A 01/01/2019   Procedure: ESOPHAGOGASTRODUODENOSCOPY (EGD) WITH PROPOFOL;  Surgeon: Lin Landsman, MD;  Location: [facility];  Service: Gastroenterology;  Laterality: N/A;   FOOT CAPSULE RELEASE W/ PERCUTANEOUS HEEL CORD LENGTHENING, TIBIAL TENDON TRANSFER     age 51 ,and 2010-both legs   JOINT REPLACEMENT Right    both knees partial replacements dates unknown    knee replacment     x 5 per pt. last one- left knee 2014. has had 2 on R 3 on L   TYMPANOPLASTY WITH GRAFT Right 01/08/2018   Procedure: TYMPANOPLASTY WITH POSSIBLE OSSICULAR CHAIN RECONSTRUCTION;  Surgeon: [name], MD;  Location: [facility];  Service: ENT;  Laterality: Right;    There were no vitals filed for this visit.    Subjective Assessment - 03/13/22 1424     Subjective R knee: 5-6/10 currently (pt sitting), 7/10 when ambulating from waiting room to treatment room, took pain medicine earlier; 9/10 at worst for the past 3 months. R mid back: 7/10 currently, 10/10 at most for the past 3-4 days.    Pertinent History Pain in R and L legs, spastic diplegic cerebral palsy. Hurt her R mid back, feels like she pulled a muscle, making it difficult for her to reach across. Feels like someone is grinding their knuckles on her R mid back, feels like she pulled a muscle. R knee and knee cap has been bothering her. L LE has been dragging worse than last time. Takes fluid pills for swelling in her legs. No L LE pain. Recently got a new KAFO and finally broke them in (was way too tight when pt first put it on. Both legs feel weak. Has a knee brace for her R knee cap to help keep it from collapsing. Got trigger  point injections for her neck and back this month (November 2023). L LE feels weak.    Patient Stated Goals Decrease pain.    Currently in Pain? Yes    Pain Score 6     Pain Location Knee    Pain Orientation Right    Pain Type Chronic pain    Pain Onset More than a month ago    Pain Frequency Constant    Aggravating Factors  R knee: cold, first thing in the morning (feels stiff), putting weight on R knee.  R mid back: turning to the R (trunk rotation), pulling the car door shut with R hand.    Pain Relieving Factors R knee: stretching. R mid back: heat, standing up and stretching back into extension (has to maintain balance in L LE)                [facility] PT Assessment - 03/13/22 1422        Assessment   Medical Diagnosis G80.1 (ICD-10-CM) - 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M79.604 (ICD-10-CM) - Pain in right leg  M79.605 (ICD-10-CM) - Pain in left leg    Referring Provider (PT) Leretha Dykes, DO    Onset Date/Surgical Date 01/24/22   Date PT referral signed   Hand Dominance Right    Prior Therapy yes with positive results      Precautions   Precaution Comments fall risk      Restrictions   Other Position/Activity Restrictions No known restrictions      Balance Screen   Has the patient fallen in the past 6 months No    Has the patient had a decrease in activity level because of a fear of falling?  Yes    Is the patient reluctant to leave their home because of a fear of falling?  No      Home Environment   Additional Comments Pt lives in a mobile home with ramp to enter. Also has a wc.      Posture/Postural Control   Posture Comments with rw: increased lumbar lordosis, movement preference around L3/L4, R pelvis lower, R weight shift, L knee in flexed position      AROM   Lumbar Flexion WFL with R mid back tightness.   performed in sitting   Lumbar Extension WFL wiht R mid to low back pain, movement preference around L 3/L4 area    Lumbar - Right Side Bend WFL    Lumbar - Left Side [facility] with R mid to low back pull    Lumbar - Right Rotation WFL with reproduction of R mid back pain with overpressure   performed in sitting   Lumbar - Left Rotation WFL wiht R mid back pain with overpressure   performed in sitting     Strength   Right Hip Flexion 4-/5    Right Hip Extension 4-/5   seated manually resisted   Right Hip ABduction 4-/5   seated manually resisted   Left Hip Flexion 3/5    Left Hip Extension 4-/5   seated manually resisted   Left Hip ABduction 3+/5   seated manually resisted   Right Knee Flexion 4+/5    Right Knee Extension 4+/5    Left Knee Flexion 3/5    Left Knee Extension 3-/5      Palpation   Palpation comment Muscle tension R  thoracolumbar paraspinal muscle. TTP with muscle tension R vastus lateralis, and R latearl hamstrings. R patella positioned laterally.  L rectus femoris muscle atrophy     Ambulation/Gait   Gait Comments Gait: with rw, L hip in ER, uses hip adductors to advance L LE, R hip in IR. Pt wearing L knee, ankle orthosis with knee locked in extension when ambulating or upright.                        Objective measurements completed on examination: See above findings.   HTN controlled per pt          Gait: with rw, L hip in ER, uses hip adductors to advance L LE, R hip in IR. Pt wearing L knee, ankle orthosis with knee locked in extension when ambulating or upright.     Obtain FOTO next visit.    Response to treatment Pt tolerated session well without aggravation of symptoms.    Clinical impression Pt is a 51 year old female who came to physical therapy secondary to spastic diplegic cerebral palsy and B leg pain. She also presents with back pain, altered gait pattern and posture, B LE weakness, thoracolumbar paraspinal muscle tension, TTP to R thigh, back pain with lumbar AROM, and difficulty with gait and standing tasks. Pt will benefit from skilled physical therapy services to address the aforementioned deficits.                  PT Education - 03/13/22 1823     Education Details Plan of care    Person(s) Educated Patient    Methods Explanation    Comprehension Verbalized understanding              PT Short Term Goals - 03/13/22 1815       PT SHORT TERM GOAL #1   Title Pt will be independent with her initial HEP to decrease R thigh, knee and back pain, improve B LE strength and ability to ambulate with less difficulty.    Time 3    Period Weeks    Status New    Target Date 04/05/22               PT Long Term Goals - 03/13/22 1816       PT LONG TERM GOAL #1   Title Pt will have a decrease in R knee pain to 4/10 or less at worst  to promote ability to ambulate with less difficulty.    Baseline R knee pain 9/10 at worst for the past 3 months (03/13/2022)    Time 8    Period Weeks    Status New    Target Date 05/10/22      PT LONG TERM GOAL #2   Title Pt will have a decrease in mid back pain to 4/10 or less at worst to promote ability to ambulate, perform standing tasks with less difficulty.    Baseline 10/10 mid back pain at worst for the past 3-4 days (03/13/2022)    Time 8    Period Weeks    Status New    Target Date 05/10/22      PT LONG TERM GOAL #3   Title Pt will improve B hip flexion, extension, abduction, and knee extension strength by at least 1/2 MMT grade to promote ability to ambulate with less difficulty.    Baseline Hip flexion 4-/5 R, 3/5 L, hip extension 4-/5 R and L, hip abduction 4-/5 R, 3+/5 L, knee extension 4+/5 R, 3-/5 L (03/13/2022)    Time 8  Period Weeks    Status New    Target Date 05/10/22      PT LONG TERM GOAL #4   Title Pt will improve her FOTO score by at least 10 points as a demonstration of improved function.    Baseline Initial FOTO score not yet obtained. (03/13/2022)    Time 8    Period Weeks    Status New    Target Date 05/10/22                    Plan - 03/13/22 1811     Clinical Impression Statement Pt is a 51 year old female who came to physical therapy secondary to spastic diplegic cerebral palsy and B leg pain. She also presents with back pain, altered gait pattern and posture, B LE weakness, thoracolumbar paraspinal muscle tension, TTP to R thigh, back pain with lumbar AROM, and difficulty with gait and standing tasks. Pt will benefit from skilled physical therapy services to address the aforementioned deficits.    Personal Factors and Comorbidities Comorbidity 3+;Past/Current Experience;Time since onset of injury/illness/exacerbation;Fitness    Comorbidities Anxiety, arthritis, COPD, DM, depression, HTN    Examination-Activity Limitations  Stairs;Lift;Caring for Others;Transfers;Carry;Locomotion Level;Squat    Stability/Clinical Decision Making Evolving/Moderate complexity   R knee and thigh pain worsening based on subjective reports.   Clinical Decision Making Moderate    Rehab Potential Fair    PT Frequency 2x / week    PT Duration 8 weeks    PT Treatment/Interventions Electrical Stimulation;Iontophoresis '4mg'$ /ml Dexamethasone;Gait training;Stair training;Functional mobility training;Therapeutic activities;Therapeutic exercise;Balance training;Neuromuscular re-education;Patient/family education;Manual techniques;Dry needling    PT Next Visit Plan Posture, trunk, hip, and knee strengthening, manual techniques, modalities PRN    Consulted and Agree with Plan of Care Patient             Patient will benefit from skilled therapeutic intervention in order to improve the following deficits and impairments:  Abnormal gait, Decreased balance, Decreased activity tolerance, Decreased endurance, Decreased strength, Difficulty walking, Postural dysfunction, Improper body mechanics, Pain  Visit Diagnosis: Other low back pain - Plan: PT plan of care cert/re-cert  Difficulty in walking, not elsewhere classified - Plan: PT plan of care cert/re-cert  Chronic pain of right knee - Plan: PT plan of care cert/re-cert  Muscle weakness (generalized) - Plan: PT plan of care cert/re-cert     Problem List Patient Active Problem List   Diagnosis Date Noted   Medication reaction 08/29/2021   No-show for appointment 12/22/2020   Obesity due to excess calories 09/22/2020   Nausea 09/20/2020   Sinusitis 08/09/2020   Insomnia due to medical condition 05/11/2020   COPD (chronic obstructive pulmonary disease) ([place]) 03/22/2020   Hamstring tendonitis 01/29/2020   MDD (major depressive disorder), recurrent, in full remission ([place]) 08/12/2019   Seizure-like activity ([place]) 07/08/2019   Mild episode of recurrent major depressive disorder ([place])  12/29/2018   Insomnia due to mental condition 12/29/2018   Disorder of vein 03/04/2018   Lumbar sprain 03/04/2018   Muscle weakness 03/04/2018   Neck sprain 03/04/2018   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   Postmenopausal bleeding 02/18/2018   Impingement syndrome of shoulder region 02/04/2018   Chest pain with moderate risk for cardiac etiology 05/19/2017   History of depression 04/15/2017   GAD (generalized anxiety disorder) 04/15/2017   Chronic daily headache 04/15/2017   Panic attack 04/15/2017   Nocturnal enuresis 04/15/2017   Mild cognitive impairment 04/15/2017   Loss of memory 01/14/2017   Pain  medication agreement signed 01/08/2017   Headache disorder 12/04/2016   Chronic, continuous use of opioids 07/01/2015   Vertigo 07/01/2015   Chronic midline low back pain without sciatica 03/21/2015   Acute renal failure (ARF) ([place]) 02/19/2015   Type 2 diabetes mellitus without complication ([place]) 40/37/0964   Acute pancreatitis 09/04/2014   Cerebral palsy ([place]) 01/05/2014   Cerebral palsy with spastic/ataxic diplegia 01/05/2014   Hip pain, chronic 04/28/2013   Essential hypertension 01/10/2013   Irritable colon 01/10/2013   Noninfectious gastroenteritis and colitis 01/10/2013   Encounter for long-term (current) use of other medications 12/04/2012   Chronic GERD 08/12/2012   Epigastric pain 08/12/2012   Diarrhea 08/12/2012   Primary localized osteoarthrosis, lower leg 05/07/2012   Difficulty walking 02/06/2012   Joneen Boers PT, DPT  03/13/2022, 6:29 PM  [facility] [address], [place], [address] Phone: [phone]   Fax:  [phone]  Name: FLORA PARKS MRN: [id number] Date of Birth: [birth year]

## 2022-03-15 ENCOUNTER — Ambulatory Visit: Payer: Medicare Other

## 2022-03-15 DIAGNOSIS — G801 Spastic diplegic cerebral palsy: Secondary | ICD-10-CM

## 2022-03-15 DIAGNOSIS — M5459 Other low back pain: Secondary | ICD-10-CM

## 2022-03-15 DIAGNOSIS — G8929 Other chronic pain: Secondary | ICD-10-CM

## 2022-03-15 DIAGNOSIS — R262 Difficulty in walking, not elsewhere classified: Secondary | ICD-10-CM

## 2022-03-15 DIAGNOSIS — R2681 Unsteadiness on feet: Secondary | ICD-10-CM

## 2022-03-15 DIAGNOSIS — R2689 Other abnormalities of gait and mobility: Secondary | ICD-10-CM

## 2022-03-15 DIAGNOSIS — M6281 Muscle weakness (generalized): Secondary | ICD-10-CM

## 2022-03-15 DIAGNOSIS — M545 Low back pain, unspecified: Secondary | ICD-10-CM

## 2022-03-15 DIAGNOSIS — M542 Cervicalgia: Secondary | ICD-10-CM

## 2022-03-15 NOTE — Therapy (Signed)
[facility] [address], [place], [address] Phone: [phone]   Fax:  [phone]  Physical Therapy Treatment  Patient Details  Name: Traci Davis MRN: [id number] Date of Birth: 11/02/1970 Referring Provider (PT): Leretha Dykes, DO   Encounter Date: 03/15/2022   PT End of Session - 03/15/22 1620     Visit Number 2    Number of Visits 17    Date for PT Re-Evaluation 05/10/22    Authorization Type [employer] Medicare    Authorization Time Period 03/13/22-05/10/22    Progress Note Due on Visit 10    PT Start Time 1552    PT Stop Time 1625    PT Time Calculation (min) 33 min    Activity Tolerance Patient tolerated treatment well;No increased pain    Behavior During Therapy WFL for tasks assessed/performed             Past Medical History:  Diagnosis Date   Anxiety    Arthritis    Asthma    Cerebral palsy ([place])    Complication of anesthesia    panic attacks before surgery   COPD (chronic obstructive pulmonary disease) ([place])    Depression    Diabetes mellitus without complication ([place])    GERD (gastroesophageal reflux disease)    Hypertension     Past Surgical History:  Procedure Laterality Date   CESAREAN SECTION  1995   CHOLECYSTECTOMY     DILATATION & CURETTAGE/HYSTEROSCOPY WITH MYOSURE N/A 02/20/2018   Procedure: DILATATION & CURETTAGE/HYSTEROSCOPY WITH MYOSURE ENDOMETRIAL POLYPECTOMY;  Surgeon: Will Bonnet, MD;  Location: [facility];  Service: Gynecology;  Laterality: N/A;   ESOPHAGOGASTRODUODENOSCOPY (EGD) WITH PROPOFOL N/A 01/01/2019   Procedure: ESOPHAGOGASTRODUODENOSCOPY (EGD) WITH PROPOFOL;  Surgeon: Lin Landsman, MD;  Location: [facility];  Service: Gastroenterology;  Laterality: N/A;   FOOT CAPSULE RELEASE W/ PERCUTANEOUS HEEL CORD LENGTHENING, TIBIAL TENDON TRANSFER     age 51 ,and 2010-both legs   JOINT REPLACEMENT Right    both knees partial replacements  dates unknown   knee replacment     x 5 per pt. last one- left knee 2014. has had 2 on R 3 on L   TYMPANOPLASTY WITH GRAFT Right 01/08/2018   Procedure: TYMPANOPLASTY WITH POSSIBLE OSSICULAR CHAIN RECONSTRUCTION;  Surgeon: [name], MD;  Location: [facility];  Service: ENT;  Laterality: Right;    There were no vitals filed for this visit.   Subjective Assessment - 03/15/22 1558     Subjective Pt reports her stretches at home have helped with with back pain a lot, now at 5/10, but she does have pain with inhalation at that same spot.    Pertinent History Pain in R and L legs, spastic diplegic cerebral palsy. Hurt her R mid back, feels like she pulled a muscle, making it difficult for her to reach across. Feels like someone is grinding their knuckles on her R mid back, feels like she pulled a muscle. R knee and knee cap has been bothering her. L LE has been dragging worse than last time. Takes fluid pills for swelling in her legs. No L LE pain. Recently got a new KAFO and finally broke them in (was way too tight when pt first put it on. Both legs feel weak. Has a knee brace for her R knee cap to help keep it from collapsing. Got trigger point injections for her neck and back this month (November 2023). L LE feels  weak.    Patient Stated Goals Decrease pain.    Currently in Pain? Yes    Pain Score 5    right middle back            INTERVENTION: -prone on plinth, minA, padded knees and ankles due to chronic pain /postural intolerance -sustained mysofascial release to Rt lower trap x 10 minutes, Rt medial scapular border x10 minutes, IASTM to rt lower trap c metal tool x3 minutes  -transition from prone to hooklying with min-modA -hooklying with T7 towel roll x3 minutes, then addition of PVC wand flexion 1x20 (good tolerance, confirms thoracic mobility)  -hooklying to short sitting Left EOB  -seated scapular protraction and retraction x15 with functional arm movements leading the pack, good  achievement of medial scapular muscle stretch in protraction -seated trunk rotation bilatx8 -seated extension over T7 gait belt in BUE x15   Pain reduction to 2/10 at end of session       PT Education - 03/15/22 1626     Education Details education on thoracic mobility techniques    Person(s) Educated Patient    Methods Explanation;Demonstration;Tactile cues    Comprehension Verbalized understanding;Need further instruction              PT Short Term Goals - 03/13/22 1815       PT SHORT TERM GOAL #1   Title Pt will be independent with her initial HEP to decrease R thigh, knee and back pain, improve B LE strength and ability to ambulate with less difficulty.    Time 3    Period Weeks    Status New    Target Date 04/05/22               PT Long Term Goals - 03/13/22 1816       PT LONG TERM GOAL #1   Title Pt will have a decrease in R knee pain to 4/10 or less at worst to promote ability to ambulate with less difficulty.    Baseline R knee pain 9/10 at worst for the past 3 months (03/13/2022)    Time 8    Period Weeks    Status New    Target Date 05/10/22      PT LONG TERM GOAL #2   Title Pt will have a decrease in mid back pain to 4/10 or less at worst to promote ability to ambulate, perform standing tasks with less difficulty.    Baseline 10/10 mid back pain at worst for the past 3-4 days (03/13/2022)    Time 8    Period Weeks    Status New    Target Date 05/10/22      PT LONG TERM GOAL #3   Title Pt will improve B hip flexion, extension, abduction, and knee extension strength by at least 1/2 MMT grade to promote ability to ambulate with less difficulty.    Baseline Hip flexion 4-/5 R, 3/5 L, hip extension 4-/5 R and L, hip abduction 4-/5 R, 3+/5 L, knee extension 4+/5 R, 3-/5 L (03/13/2022)    Time 8    Period Weeks    Status New    Target Date 05/10/22      PT LONG TERM GOAL #4   Title Pt will improve her FOTO score by at least 10 points as a  demonstration of improved function.    Baseline Initial FOTO score not yet obtained. (03/13/2022)    Time 8    Period Weeks  Status New    Target Date 05/10/22                   Plan - 03/15/22 1628     Clinical Impression Statement Started out with mysofascial release to symptomatic Rt lower trap, good resolution in spasm and reduction in pain. Continued with rhomboids release due to presence of latent trigger points. Pt has pain reuction to 2/10 by end of session. Noted thoracic hypomobility, but good response to mobilization. Pt educated on need to balance out postural and hypertrophy tendencies given her frequent and high level pushing tendecy with RW use and need to bring in more extension ROM and pulling strength. Will continue to follow.    Personal Factors and Comorbidities Comorbidity 3+;Past/Current Experience;Time since onset of injury/illness/exacerbation;Fitness    Comorbidities Anxiety, arthritis, COPD, DM, depression, HTN    Examination-Activity Limitations Stairs;Lift;Caring for Others;Transfers;Carry;Locomotion Level;Squat    Stability/Clinical Decision Making Evolving/Moderate complexity    Clinical Decision Making Moderate    Rehab Potential Fair    PT Frequency 2x / week    PT Duration 8 weeks    PT Treatment/Interventions Electrical Stimulation;Iontophoresis '4mg'$ /ml Dexamethasone;Gait training;Stair training;Functional mobility training;Therapeutic activities;Therapeutic exercise;Balance training;Neuromuscular re-education;Patient/family education;Manual techniques;Dry needling    PT Next Visit Plan FU on thoracic extension intervention and myofascial release to Rt periscapular zone    PT Home Exercise Plan no changes to program this visit; discussed trial of throacic towel roll if desired, but prefer to try this here first a few times before adding to home program    Consulted and Agree with Plan of Care Patient             Patient will benefit from  skilled therapeutic intervention in order to improve the following deficits and impairments:  Abnormal gait, Decreased balance, Decreased activity tolerance, Decreased endurance, Decreased strength, Difficulty walking, Postural dysfunction, Improper body mechanics, Pain  Visit Diagnosis: Difficulty in walking, not elsewhere classified  Chronic pain of right knee  Muscle weakness (generalized)  Other low back pain  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([facility])  Other abnormalities of gait and mobility  Chronic bilateral low back pain, unspecified whether sciatica present  Unsteadiness on feet  Cervicalgia     Problem List Patient Active Problem List   Diagnosis Date Noted   Medication reaction 08/29/2021   No-show for appointment 12/22/2020   Obesity due to excess calories 09/22/2020   Nausea 09/20/2020   Sinusitis 08/09/2020   Insomnia due to medical condition 05/11/2020   COPD (chronic obstructive pulmonary disease) ([place]) 03/22/2020   Hamstring tendonitis 01/29/2020   MDD (major depressive disorder), recurrent, in full remission ([place]) 08/12/2019   Seizure-like activity ([place]) 07/08/2019   Mild episode of recurrent major depressive disorder ([place]) 12/29/2018   Insomnia due to mental condition 12/29/2018   Disorder of vein 03/04/2018   Lumbar sprain 03/04/2018   Muscle weakness 03/04/2018   Neck sprain 03/04/2018   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   Postmenopausal bleeding 02/18/2018   Impingement syndrome of shoulder region 02/04/2018   Chest pain with moderate risk for cardiac etiology 05/19/2017   History of depression 04/15/2017   GAD (generalized anxiety disorder) 04/15/2017   Chronic daily headache 04/15/2017   Panic attack 04/15/2017   Nocturnal enuresis 04/15/2017   Mild cognitive impairment 04/15/2017   Loss of memory 01/14/2017   Pain medication agreement signed 01/08/2017   Headache disorder 12/04/2016   Chronic, continuous use of opioids 07/01/2015   Vertigo  07/01/2015   Chronic midline  low back pain without sciatica 03/21/2015   Acute renal failure (ARF) ([place]) 02/19/2015   Type 2 diabetes mellitus without complication ([place] [address]) 01/65/5374   Acute pancreatitis 09/04/2014   Cerebral palsy ([place]) 01/05/2014   Cerebral palsy with spastic/ataxic diplegia 01/05/2014   Hip pain, chronic 04/28/2013   Essential hypertension 01/10/2013   Irritable colon 01/10/2013   Noninfectious gastroenteritis and colitis 01/10/2013   Encounter for long-term (current) use of other medications 12/04/2012   Chronic GERD 08/12/2012   Epigastric pain 08/12/2012   Diarrhea 08/12/2012   Primary localized osteoarthrosis, lower leg 05/07/2012   Difficulty walking 02/06/2012   5:44 PM, 03/15/22 Etta Grandchild, PT, DPT Physical Therapist - [place] [phone] (Office)   Orlinda C, PT 03/15/2022, 5:39 PM  [facility] [address] [place], [address] Phone: [phone]   Fax:  [phone]  Name: THEODORA LALANNE MRN: [id number] Date of Birth: Sep 03, 1970

## 2022-03-19 ENCOUNTER — Ambulatory Visit: Payer: Medicare Other

## 2022-03-21 ENCOUNTER — Ambulatory Visit: Payer: Medicare Other

## 2022-03-21 DIAGNOSIS — R262 Difficulty in walking, not elsewhere classified: Secondary | ICD-10-CM

## 2022-03-21 DIAGNOSIS — G8929 Other chronic pain: Secondary | ICD-10-CM

## 2022-03-21 DIAGNOSIS — M5459 Other low back pain: Secondary | ICD-10-CM

## 2022-03-21 DIAGNOSIS — G801 Spastic diplegic cerebral palsy: Secondary | ICD-10-CM

## 2022-03-21 DIAGNOSIS — M6281 Muscle weakness (generalized): Secondary | ICD-10-CM

## 2022-03-21 NOTE — Therapy (Signed)
OUTPATIENT PHYSICAL THERAPY TREATMENT NOTE   Patient Name: Traci Davis MRN: [id number] DOB:03/01/1971, 51 y.o., female Today's Date: 03/21/2022  PCP: Konrad Saha, MD  REFERRING PROVIDER: Leretha Dykes, DO    END OF SESSION:  PT End of Session - 03/21/22 1502     Visit Number 3    Number of Visits 17    Date for PT Re-Evaluation 05/10/22    Authorization Type [employer] Medicare    Authorization Time Period 03/13/22-05/10/22    Progress Note Due on Visit 10    PT Start Time 1503    PT Stop Time 1543    PT Time Calculation (min) 40 min    Activity Tolerance Patient tolerated treatment well;No increased pain    Behavior During Therapy WFL for tasks assessed/performed             Past Medical History:  Diagnosis Date   Anxiety    Arthritis    Asthma    Cerebral palsy ([place])    Complication of anesthesia    panic attacks before surgery   COPD (chronic obstructive pulmonary disease) ([place])    Depression    Diabetes mellitus without complication ([place])    GERD (gastroesophageal reflux disease)    Hypertension    Past Surgical History:  Procedure Laterality Date   CESAREAN SECTION  1995   CHOLECYSTECTOMY     DILATATION & CURETTAGE/HYSTEROSCOPY WITH MYOSURE N/A 02/20/2018   Procedure: DILATATION & CURETTAGE/HYSTEROSCOPY WITH MYOSURE ENDOMETRIAL POLYPECTOMY;  Surgeon: Will Bonnet, MD;  Location: [facility];  Service: Gynecology;  Laterality: N/A;   ESOPHAGOGASTRODUODENOSCOPY (EGD) WITH PROPOFOL N/A 01/01/2019   Procedure: ESOPHAGOGASTRODUODENOSCOPY (EGD) WITH PROPOFOL;  Surgeon: Lin Landsman, MD;  Location: [facility];  Service: Gastroenterology;  Laterality: N/A;   FOOT CAPSULE RELEASE W/ PERCUTANEOUS HEEL CORD LENGTHENING, TIBIAL TENDON TRANSFER     age 79 ,and 2010-both legs   JOINT REPLACEMENT Right    both knees partial replacements dates unknown   knee replacment     x 5 per pt. last one- left knee 2014. has had 2 on R 3 on L    TYMPANOPLASTY WITH GRAFT Right 01/08/2018   Procedure: TYMPANOPLASTY WITH POSSIBLE OSSICULAR CHAIN RECONSTRUCTION;  Surgeon: [name], MD;  Location: [facility];  Service: ENT;  Laterality: Right;   Patient Active Problem List   Diagnosis Date Noted   Medication reaction 08/29/2021   No-show for appointment 12/22/2020   Obesity due to excess calories 09/22/2020   Nausea 09/20/2020   Sinusitis 08/09/2020   Insomnia due to medical condition 05/11/2020   COPD (chronic obstructive pulmonary disease) ([place]) 03/22/2020   Hamstring tendonitis 01/29/2020   MDD (major depressive disorder), recurrent, in full remission ([place]) 08/12/2019   Seizure-like activity ([name]) 07/08/2019   Mild episode of recurrent major depressive disorder ([place]) 12/29/2018   Insomnia due to mental condition 12/29/2018   Disorder of vein 03/04/2018   Lumbar sprain 03/04/2018   Muscle weakness 03/04/2018   Neck sprain 03/04/2018   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   Postmenopausal bleeding 02/18/2018   Impingement syndrome of shoulder region 02/04/2018   Chest pain with moderate risk for cardiac etiology 05/19/2017   History of depression 04/15/2017   GAD (generalized anxiety disorder) 04/15/2017   Chronic daily headache 04/15/2017   Panic attack 04/15/2017   Nocturnal enuresis 04/15/2017   Mild cognitive impairment 04/15/2017   Loss of memory 01/14/2017   Pain medication agreement signed 01/08/2017   Headache disorder 12/04/2016   Chronic,  continuous use of opioids 07/01/2015   Vertigo 07/01/2015   Chronic midline low back pain without sciatica 03/21/2015   Acute renal failure (ARF) ([place]) 02/19/2015   Type 2 diabetes mellitus without complication ([place]) 56/81/2751   Acute pancreatitis 09/04/2014   Cerebral palsy ([place]) 01/05/2014   Cerebral palsy with spastic/ataxic diplegia 01/05/2014   Hip pain, chronic 04/28/2013   Essential hypertension 01/10/2013   Irritable colon 01/10/2013   Noninfectious gastroenteritis and  colitis 01/10/2013   Encounter for long-term (current) use of other medications 12/04/2012   Chronic GERD 08/12/2012   Epigastric pain 08/12/2012   Diarrhea 08/12/2012   Primary localized osteoarthrosis, lower leg 05/07/2012   Difficulty walking 02/06/2012    REFERRING DIAG: G80.1 (ICD-10-CM) - 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M79.604 (ICD-10-CM) - Pain in right leg M79.605 (ICD-10-CM) - Pain in left leg   THERAPY DIAG:  Difficulty in walking, not elsewhere classified  Chronic pain of right knee  Muscle weakness (generalized)  Other low back pain  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(HCC)  Rationale for Evaluation and Treatment Rehabilitation  PERTINENT HISTORY: Pain in R and L legs, spastic diplegic cerebral palsy. Hurt her R mid back, feels like she pulled a muscle, making it difficult for her to reach across. Feels like someone is grinding their knuckles on her R mid back, feels like she pulled a muscle. R knee and knee cap has been bothering her. L LE has been dragging worse than last time. Takes fluid pills for swelling in her legs. No L LE pain. Recently got a new KAFO and finally broke them in (was way too tight when pt first put it on. Both legs feel weak. Has a knee brace for her R knee cap to help keep it from collapsing. Got trigger point injections for her neck and back this month (November 2023). L LE feels weak.   PRECAUTIONS: fall risk  SUBJECTIVE: Has a hard time advancing her L LE, has to turn it out, starting to drag more. Just took her pain medicine.   SUBJECTIVE STATEMENT:   PAIN:  Are you having pain? 7/10 R mid back pain, R knee does not really give her a lot of trouble with the knee brace. No L knee pain currently.,    TODAY'S TREATMENT:                                                                                                                                         DATE: 03/21/2022    Latex Allergies   Therapeutic exercise  Standing hip abduction  with B UE assist with knee brace locked into extension  R 10x  L 10x  Seated on elevated mat table  SLR hip flexion    L 5x3   R 5x3   Hip extension isometrics with knees bent   R 10x5 seconds for 3 sets   L 10x5 seconds for 3  sets    Sit <> stand 5x   Clamshells (hips less than 90 degrees flexion) to promote femoral control with sit <> stand   Red band 10x2   Seated hip adduction isometrics 10x2 with 5 second holds   Improved exercise technique, movement at target joints, use of target muscles after mod verbal, visual, tactile cues.    Response to treatment Pt tolerated session well without aggravation of symptoms.      Clinical impression  Worked on improving glute med, max, hip flexor muscle strength to improve femoral control with sit <> stand to decrease R knee pain as well as improve ability to andvance her lower extremities with less difficulty while ambulating. Pt tolerated session well without aggravation of symptoms. Pt will benefit from continued skilled physical therapy services to decrease pain, improve strength and function.       PATIENT EDUCATION: Education details: HEP, there-ex Person educated: Patient Education method: Explanation, Demonstration, Tactile cues, Verbal cues, and Handouts Education comprehension: verbalized understanding and returned demonstration  HOME EXERCISE PROGRAM: Access Code: [email or web address] URL: [email or web address] Date: 03/21/2022 Prepared by: Joneen Boers  Exercises - Seated Hip Abduction with Resistance  - 1 x daily - 7 x weekly - 2 sets - 10 reps  Red band  - Seated Hip Adduction Isometrics with Ball  - 1 x daily - 7 x weekly - 3 sets - 10 reps - 5 seconds hold  - Standing Hip Abduction with Counter Support  - 1 x daily - 7 x weekly - 2-3 sets - 10 reps     PT Short Term Goals - 03/13/22 1815       PT SHORT TERM GOAL #1   Title Pt will be independent with her initial HEP to decrease R thigh, knee and  back pain, improve B LE strength and ability to ambulate with less difficulty.    Time 3    Period Weeks    Status New    Target Date 04/05/22              PT Long Term Goals - 03/21/22 1630       PT LONG TERM GOAL #1   Title Pt will have a decrease in R knee pain to 4/10 or less at worst to promote ability to ambulate with less difficulty.    Baseline R knee pain 9/10 at worst for the past 3 months (03/13/2022)    Time 8    Period Weeks    Status New    Target Date 05/10/22      PT LONG TERM GOAL #2   Title Pt will have a decrease in mid back pain to 4/10 or less at worst to promote ability to ambulate, perform standing tasks with less difficulty.    Baseline 10/10 mid back pain at worst for the past 3-4 days (03/13/2022)    Time 8    Period Weeks    Status New    Target Date 05/10/22      PT LONG TERM GOAL #3   Title Pt will improve B hip flexion, extension, abduction, and knee extension strength by at least 1/2 MMT grade to promote ability to ambulate with less difficulty.    Baseline Hip flexion 4-/5 R, 3/5 L, hip extension 4-/5 R and L, hip abduction 4-/5 R, 3+/5 L, knee extension 4+/5 R, 3-/5 L (03/13/2022)    Time 8    Period Weeks    Status New    Target Date 05/10/22  PT LONG TERM GOAL #4   Title Pt will improve her FOTO score by at least 10 points as a demonstration of improved function.    Baseline Initial FOTO score not yet obtained. (03/13/2022); Upper leg FOTO 39 (03/21/2022)    Time 8    Period Weeks    Status New    Target Date 05/10/22              Plan - 03/21/22 1634     Clinical Impression Statement Worked on improving glute med, max, hip flexor muscle strength to improve femoral control with sit <> stand to decrease R knee pain as well as improve ability to andvance her lower extremities with less difficulty while ambulating. Pt tolerated session well without aggravation of symptoms. Pt will benefit from continued skilled physical  therapy services to decrease pain, improve strength and function.    Personal Factors and Comorbidities Comorbidity 3+;Past/Current Experience;Time since onset of injury/illness/exacerbation;Fitness    Comorbidities Anxiety, arthritis, COPD, DM, depression, HTN    Examination-Activity Limitations Stairs;Lift;Caring for Others;Transfers;Carry;Locomotion Level;Squat    Stability/Clinical Decision Making Evolving/Moderate complexity    Rehab Potential Fair    PT Frequency 2x / week    PT Duration 8 weeks    PT Treatment/Interventions Electrical Stimulation;Iontophoresis '4mg'$ /ml Dexamethasone;Gait training;Stair training;Functional mobility training;Therapeutic activities;Therapeutic exercise;Balance training;Neuromuscular re-education;Patient/family education;Manual techniques;Dry needling    PT Next Visit Plan FU on thoracic extension intervention and myofascial release to Rt periscapular zone    PT Home Exercise Plan no changes to program this visit; discussed trial of throacic towel roll if desired, but prefer to try this here first a few times before adding to home program    Consulted and Agree with Plan of Care Patient              Joneen Boers PT, DPT  03/21/2022, 4:35 PM

## 2022-03-26 ENCOUNTER — Ambulatory Visit: Payer: Medicare Other

## 2022-03-28 ENCOUNTER — Ambulatory Visit: Payer: Medicare Other

## 2022-03-28 DIAGNOSIS — M6281 Muscle weakness (generalized): Secondary | ICD-10-CM

## 2022-03-28 DIAGNOSIS — M5459 Other low back pain: Secondary | ICD-10-CM

## 2022-03-28 DIAGNOSIS — G801 Spastic diplegic cerebral palsy: Secondary | ICD-10-CM

## 2022-03-28 DIAGNOSIS — G8929 Other chronic pain: Secondary | ICD-10-CM

## 2022-03-28 DIAGNOSIS — R262 Difficulty in walking, not elsewhere classified: Secondary | ICD-10-CM

## 2022-03-28 NOTE — Therapy (Signed)
OUTPATIENT PHYSICAL THERAPY TREATMENT NOTE   Patient Name: Traci Davis MRN: [id number] DOB:[birth year], 51 y.o., female Today's Date: 03/28/2022  PCP: Konrad Saha, MD  REFERRING PROVIDER: Leretha Dykes, DO    END OF SESSION:  PT End of Session - 03/28/22 1411     Visit Number 4    Number of Visits 17    Date for PT Re-Evaluation 05/10/22    Authorization Type [employer]HC Medicare    Authorization Time Period 03/13/22-05/10/22    Progress Note Due on Visit 10    PT Start Time 2952    PT Stop Time 1456    PT Time Calculation (min) 45 min    Activity Tolerance Patient tolerated treatment well;No increased pain    Behavior During Therapy WFL for tasks assessed/performed              Past Medical History:  Diagnosis Date   Anxiety    Arthritis    Asthma    Cerebral palsy ([place])    Complication of anesthesia    panic attacks before surgery   COPD (chronic obstructive pulmonary disease) ([place])    Depression    Diabetes mellitus without complication ([place])    GERD (gastroesophageal reflux disease)    Hypertension    Past Surgical History:  Procedure Laterality Date   CESAREAN SECTION  1995   CHOLECYSTECTOMY     DILATATION & CURETTAGE/HYSTEROSCOPY WITH MYOSURE N/A 02/20/2018   Procedure: DILATATION & CURETTAGE/HYSTEROSCOPY WITH MYOSURE ENDOMETRIAL POLYPECTOMY;  Surgeon: Will Bonnet, MD;  Location: [facility];  Service: Gynecology;  Laterality: N/A;   ESOPHAGOGASTRODUODENOSCOPY (EGD) WITH PROPOFOL N/A 01/01/2019   Procedure: ESOPHAGOGASTRODUODENOSCOPY (EGD) WITH PROPOFOL;  Surgeon: Lin Landsman, MD;  Location: [facility];  Service: Gastroenterology;  Laterality: N/A;   FOOT CAPSULE RELEASE W/ PERCUTANEOUS HEEL CORD LENGTHENING, TIBIAL TENDON TRANSFER     age 50 ,and 2010-both legs   JOINT REPLACEMENT Right    both knees partial replacements dates unknown   knee replacment     x 5 per pt. last one- left knee 2014. has had 2 on R 3 on L    TYMPANOPLASTY WITH GRAFT Right 01/08/2018   Procedure: TYMPANOPLASTY WITH POSSIBLE OSSICULAR CHAIN RECONSTRUCTION;  Surgeon: [name], MD;  Location: [facility];  Service: ENT;  Laterality: Right;   Patient Active Problem List   Diagnosis Date Noted   Medication reaction 08/29/2021   No-show for appointment 12/22/2020   Obesity due to excess calories 09/22/2020   Nausea 09/20/2020   Sinusitis 08/09/2020   Insomnia due to medical condition 05/11/2020   COPD (chronic obstructive pulmonary disease) ([place]) 03/22/2020   Hamstring tendonitis 01/29/2020   MDD (major depressive disorder), recurrent, in full remission ([place]) 08/12/2019   Seizure-like activity ([place]) 07/08/2019   Mild episode of recurrent major depressive disorder ([place]) 12/29/2018   Insomnia due to mental condition 12/29/2018   Disorder of vein 03/04/2018   Lumbar sprain 03/04/2018   Muscle weakness 03/04/2018   Neck sprain 03/04/2018   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   Postmenopausal bleeding 02/18/2018   Impingement syndrome of shoulder region 02/04/2018   Chest pain with moderate risk for cardiac etiology 05/19/2017   History of depression 04/15/2017   GAD (generalized anxiety disorder) 04/15/2017   Chronic daily headache 04/15/2017   Panic attack 04/15/2017   Nocturnal enuresis 04/15/2017   Mild cognitive impairment 04/15/2017   Loss of memory 01/14/2017   Pain medication agreement signed 01/08/2017   Headache disorder 12/04/2016  Chronic, continuous use of opioids 07/01/2015   Vertigo 07/01/2015   Chronic midline low back pain without sciatica 03/21/2015   Acute renal failure (ARF) ([place]) 02/19/2015   Type 2 diabetes mellitus without complication ([place]) 46/27/0350   Acute pancreatitis 09/04/2014   Cerebral palsy ([place]) 01/05/2014   Cerebral palsy with spastic/ataxic diplegia 01/05/2014   Hip pain, chronic 04/28/2013   Essential hypertension 01/10/2013   Irritable colon 01/10/2013   Noninfectious gastroenteritis and  colitis 01/10/2013   Encounter for long-term (current) use of other medications 12/04/2012   Chronic GERD 08/12/2012   Epigastric pain 08/12/2012   Diarrhea 08/12/2012   Primary localized osteoarthrosis, lower leg 05/07/2012   Difficulty walking 02/06/2012    REFERRING DIAG: G80.1 (ICD-10-CM) - 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M79.604 (ICD-10-CM) - Pain in right leg M79.605 (ICD-10-CM) - Pain in left leg   THERAPY DIAG:  Difficulty in walking, not elsewhere classified  Chronic pain of right knee  Muscle weakness (generalized)  Other low back pain  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(HCC)  Rationale for Evaluation and Treatment Rehabilitation  PERTINENT HISTORY: Pain in R and L legs, spastic diplegic cerebral palsy. Hurt her R mid back, feels like she pulled a muscle, making it difficult for her to reach across. Feels like someone is grinding their knuckles on her R mid back, feels like she pulled a muscle. R knee and knee cap has been bothering her. L LE has been dragging worse than last time. Takes fluid pills for swelling in her legs. No L LE pain. Recently got a new KAFO and finally broke them in (was way too tight when pt first put it on. Both legs feel weak. Has a knee brace for her R knee cap to help keep it from collapsing. Got trigger point injections for her neck and back this month (November 2023). L LE feels weak.   PRECAUTIONS: fall risk  SUBJECTIVE: L lateral knee  bothering her today, 8/10. R mid back is not bothering her right now. Has been doing her exercises at home.   SUBJECTIVE STATEMENT:   PAIN:  Are you having pain? 7/10 R mid back pain, R knee does not really give her a lot of trouble with the knee brace. No L knee pain currently.,    TODAY'S TREATMENT:                                                                                                                                         DATE: 03/28/2022    Latex Allergies   Manual therapy  TTP L lateral  knee joint line  Seated STM L lateral hamstrings to decrease tension  Seated medial glide L patella grade 3 to promote mobility  Seated STM L lateral knee to decrease fascial restrictions   Therapeutic exercise   Seated on elevated mat table  SLR hip flexion    L 5x3  R 5x3   Hip extension isometrics with knees bent   R 10x5 seconds for 3 sets   L 10x5 seconds for 3 sets   Standing L hip abduction and flexion with B UE assist 10x with knee locked into extension    Then gait from treatment room to car with L LE circumduction secondary to L LE longer than R LE and L knee locked in extension using rw.  SBA  To help decrease medial and lateral stress to L knee during L LE stance phase as well as R medial knee stress during R LE stance phase.     Improved exercise technique, movement at target joints, use of target muscles after mod verbal, visual, tactile cues.    Response to treatment Pt tolerated session well without aggravation of symptoms. Decreased L knee pain with seated treatment.     Clinical impression  Decreased L knee pain after treatment. Worked on L hip abduction and flexion (circumduction) during gait secondary to L LE longer than R and L knee locked into extension to decrease L lateral and medial knee stress. Worked on decreasing L lateral hamstrings muscle tension, improving medial patellar glide L knee and decreasing fascial restrictions to improve L LE mechanics during gait. Pt will benefit from continued skilled physical therapy services to decrease pain, improve strength and function.       PATIENT EDUCATION: Education details: HEP, there-ex Person educated: Patient Education method: Explanation, Demonstration, Tactile cues, Verbal cues, and Handouts Education comprehension: verbalized understanding and returned demonstration  HOME EXERCISE PROGRAM: Access Code: [email or web address] URL: [email or web address] Date: 03/21/2022 Prepared by:  Joneen Boers  Exercises - Seated Hip Abduction with Resistance  - 1 x daily - 7 x weekly - 2 sets - 10 reps  Red band  - Seated Hip Adduction Isometrics with Ball  - 1 x daily - 7 x weekly - 3 sets - 10 reps - 5 seconds hold  - Standing Hip Abduction with Counter Support  - 1 x daily - 7 x weekly - 2-3 sets - 10 reps     PT Short Term Goals - 03/13/22 1815       PT SHORT TERM GOAL #1   Title Pt will be independent with her initial HEP to decrease R thigh, knee and back pain, improve B LE strength and ability to ambulate with less difficulty.    Time 3    Period Weeks    Status New    Target Date 04/05/22              PT Long Term Goals - 03/21/22 1630       PT LONG TERM GOAL #1   Title Pt will have a decrease in R knee pain to 4/10 or less at worst to promote ability to ambulate with less difficulty.    Baseline R knee pain 9/10 at worst for the past 3 months (03/13/2022)    Time 8    Period Weeks    Status New    Target Date 05/10/22      PT LONG TERM GOAL #2   Title Pt will have a decrease in mid back pain to 4/10 or less at worst to promote ability to ambulate, perform standing tasks with less difficulty.    Baseline 10/10 mid back pain at worst for the past 3-4 days (03/13/2022)    Time 8    Period Weeks    Status New    Target Date 05/10/22  PT LONG TERM GOAL #3   Title Pt will improve B hip flexion, extension, abduction, and knee extension strength by at least 1/2 MMT grade to promote ability to ambulate with less difficulty.    Baseline Hip flexion 4-/5 R, 3/5 L, hip extension 4-/5 R and L, hip abduction 4-/5 R, 3+/5 L, knee extension 4+/5 R, 3-/5 L (03/13/2022)    Time 8    Period Weeks    Status New    Target Date 05/10/22      PT LONG TERM GOAL #4   Title Pt will improve her FOTO score by at least 10 points as a demonstration of improved function.    Baseline Initial FOTO score not yet obtained. (03/13/2022); Upper leg FOTO 39 (03/21/2022)     Time 8    Period Weeks    Status New    Target Date 05/10/22              Plan - 03/28/22 1407     Clinical Impression Statement Decreased L knee pain after treatment. Worked on L hip abduction and flexion (circumduction) during gait secondary to L LE longer than R and L knee locked into extension to decrease L lateral and medial knee stress. Worked on decreasing L lateral hamstrings muscle tension, improving medial patellar glide L knee and decreasing fascial restrictions to improve L LE mechanics during gait. Pt will benefit from continued skilled physical therapy services to decrease pain, improve strength and function.    Personal Factors and Comorbidities Comorbidity 3+;Past/Current Experience;Time since onset of injury/illness/exacerbation;Fitness    Comorbidities Anxiety, arthritis, COPD, DM, depression, HTN    Examination-Activity Limitations Stairs;Lift;Caring for Others;Transfers;Carry;Locomotion Level;Squat    Stability/Clinical Decision Making Stable/Uncomplicated    Clinical Decision Making Low    Rehab Potential Fair    PT Frequency 2x / week    PT Duration 8 weeks    PT Treatment/Interventions Electrical Stimulation;Iontophoresis '4mg'$ /ml Dexamethasone;Gait training;Stair training;Functional mobility training;Therapeutic activities;Therapeutic exercise;Balance training;Neuromuscular re-education;Patient/family education;Manual techniques;Dry needling    PT Next Visit Plan FU on thoracic extension intervention and myofascial release to Rt periscapular zone    PT Home Exercise Plan no changes to program this visit; discussed trial of throacic towel roll if desired, but prefer to try this here first a few times before adding to home program    Consulted and Agree with Plan of Care Patient              Joneen Boers PT, DPT  03/28/2022, 5:15 PM

## 2022-04-02 ENCOUNTER — Ambulatory Visit: Payer: Medicare Other | Attending: Physician Assistant

## 2022-04-02 DIAGNOSIS — G801 Spastic diplegic cerebral palsy: Secondary | ICD-10-CM | POA: Insufficient documentation

## 2022-04-02 DIAGNOSIS — M5459 Other low back pain: Secondary | ICD-10-CM | POA: Insufficient documentation

## 2022-04-02 DIAGNOSIS — M25561 Pain in right knee: Secondary | ICD-10-CM | POA: Diagnosis present

## 2022-04-02 DIAGNOSIS — M6281 Muscle weakness (generalized): Secondary | ICD-10-CM | POA: Insufficient documentation

## 2022-04-02 DIAGNOSIS — G8929 Other chronic pain: Secondary | ICD-10-CM | POA: Diagnosis present

## 2022-04-02 DIAGNOSIS — R262 Difficulty in walking, not elsewhere classified: Secondary | ICD-10-CM | POA: Diagnosis present

## 2022-04-02 NOTE — Therapy (Signed)
OUTPATIENT PHYSICAL THERAPY TREATMENT NOTE   Patient Name: Traci Davis MRN: [id number] DOB:[birth year], 51 y.o., female Today's Date: 04/02/2022  PCP: Konrad Saha, MD  REFERRING PROVIDER: Leretha Dykes, DO    END OF SESSION:  PT End of Session - 04/02/22 1331     Visit Number 5    Number of Visits 17    Date for PT Re-Evaluation 05/10/22    Authorization Type [employer] Medicare    Authorization Time Period 03/13/22-05/10/22    Progress Note Due on Visit 10    PT Start Time 1331    PT Stop Time 1419    PT Time Calculation (min) 48 min    Activity Tolerance Patient tolerated treatment well;No increased pain    Behavior During Therapy WFL for tasks assessed/performed               Past Medical History:  Diagnosis Date   Anxiety    Arthritis    Asthma    Cerebral palsy ([place])    Complication of anesthesia    panic attacks before surgery   COPD (chronic obstructive pulmonary disease) ([place])    Depression    Diabetes mellitus without complication ([place])    GERD (gastroesophageal reflux disease)    Hypertension    Past Surgical History:  Procedure Laterality Date   CESAREAN SECTION  1995   CHOLECYSTECTOMY     DILATATION & CURETTAGE/HYSTEROSCOPY WITH MYOSURE N/A 02/20/2018   Procedure: DILATATION & CURETTAGE/HYSTEROSCOPY WITH MYOSURE ENDOMETRIAL POLYPECTOMY;  Surgeon: Will Bonnet, MD;  Location: [facility];  Service: Gynecology;  Laterality: N/A;   ESOPHAGOGASTRODUODENOSCOPY (EGD) WITH PROPOFOL N/A 01/01/2019   Procedure: ESOPHAGOGASTRODUODENOSCOPY (EGD) WITH PROPOFOL;  Surgeon: Lin Landsman, MD;  Location: [facility];  Service: Gastroenterology;  Laterality: N/A;   FOOT CAPSULE RELEASE W/ PERCUTANEOUS HEEL CORD LENGTHENING, TIBIAL TENDON TRANSFER     age 72 ,and 2010-both legs   JOINT REPLACEMENT Right    both knees partial replacements dates unknown   knee replacment     x 5 per pt. last one- left knee 2014. has had 2 on R 3 on L    TYMPANOPLASTY WITH GRAFT Right 01/08/2018   Procedure: TYMPANOPLASTY WITH POSSIBLE OSSICULAR CHAIN RECONSTRUCTION;  Surgeon: [name], MD;  Location: [facility];  Service: ENT;  Laterality: Right;   Patient Active Problem List   Diagnosis Date Noted   Medication reaction 08/29/2021   No-show for appointment 12/22/2020   Obesity due to excess calories 09/22/2020   Nausea 09/20/2020   Sinusitis 08/09/2020   Insomnia due to medical condition 05/11/2020   COPD (chronic obstructive pulmonary disease) ([place]) 03/22/2020   Hamstring tendonitis 01/29/2020   MDD (major depressive disorder), recurrent, in full remission ([place]) 08/12/2019   Seizure-like activity ([place]) 07/08/2019   Mild episode of recurrent major depressive disorder ([place]) 12/29/2018   Insomnia due to mental condition 12/29/2018   Disorder of vein 03/04/2018   Lumbar sprain 03/04/2018   Muscle weakness 03/04/2018   Neck sprain 03/04/2018   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   Postmenopausal bleeding 02/18/2018   Impingement syndrome of shoulder region 02/04/2018   Chest pain with moderate risk for cardiac etiology 05/19/2017   History of depression 04/15/2017   GAD (generalized anxiety disorder) 04/15/2017   Chronic daily headache 04/15/2017   Panic attack 04/15/2017   Nocturnal enuresis 04/15/2017   Mild cognitive impairment 04/15/2017   Loss of memory 01/14/2017   Pain medication agreement signed 01/08/2017   Headache disorder 12/04/2016  Chronic, continuous use of opioids 07/01/2015   Vertigo 07/01/2015   Chronic midline low back pain without sciatica 03/21/2015   Acute renal failure (ARF) ([place]) 02/19/2015   Type 2 diabetes mellitus without complication ([place]) 14/48/1856   Acute pancreatitis 09/04/2014   Cerebral palsy ([place]) 01/05/2014   Cerebral palsy with spastic/ataxic diplegia 01/05/2014   Hip pain, chronic 04/28/2013   Essential hypertension 01/10/2013   Irritable colon 01/10/2013   Noninfectious gastroenteritis and  colitis 01/10/2013   Encounter for long-term (current) use of other medications 12/04/2012   Chronic GERD 08/12/2012   Epigastric pain 08/12/2012   Diarrhea 08/12/2012   Primary localized osteoarthrosis, lower leg 05/07/2012   Difficulty walking 02/06/2012    REFERRING DIAG: G80.1 (ICD-10-CM) - 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M79.604 (ICD-10-CM) - Pain in right leg M79.605 (ICD-10-CM) - Pain in left leg   THERAPY DIAG:  Difficulty in walking, not elsewhere classified  Chronic pain of right knee  Muscle weakness (generalized)  Other low back pain  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(HCC)  Rationale for Evaluation and Treatment Rehabilitation  PERTINENT HISTORY: Pain in R and L legs, spastic diplegic cerebral palsy. Hurt her R mid back, feels like she pulled a muscle, making it difficult for her to reach across. Feels like someone is grinding their knuckles on her R mid back, feels like she pulled a muscle. R knee and knee cap has been bothering her. L LE has been dragging worse than last time. Takes fluid pills for swelling in her legs. No L LE pain. Recently got a new KAFO and finally broke them in (was way too tight when pt first put it on. Both legs feel weak. Has a knee brace for her R knee cap to help keep it from collapsing. Got trigger point injections for her neck and back this month (November 2023). L LE feels weak.   PRECAUTIONS: fall risk  SUBJECTIVE: hurting all over. Hips feel sore on the inside. Woke up with her neck hurting (L upper trap).   SUBJECTIVE STATEMENT:   PAIN:  Are you having pain? 5/10 R mid back pain, R knee fine currently.  No L knee pain currently.   TODAY'S TREATMENT:                                                                                                                                         DATE: 04/02/2022    [place] Allergies   Therapeutic exercise  Seated on elevated mat table    Hip extension isometrics with knees bent   R 10x5  seconds for 3 sets    L 10x5 seconds for 3 sets  Reclined   Hooklying   Hip extension isometrics    L 10x5 seconds for 3 sets     R 10x5 seconds for 3 sets    Hip abduction isometrics, PT manual resistance    L 10x2 with  5 second holds    Improved exercise technique, movement at target joints, use of target muscles after mod verbal, visual, tactile cues.    Response to treatment Pt tolerated session well without aggravation of symptoms.     Clinical impression  Worked on improving B hip extension and L hip abduction strength to promote ability to ambulate and perform transfers with less difficulty as well as decreasing stress to her back.  Pt tolerated session well without aggravation of symptoms. Pt will benefit from continued skilled physical therapy services to decrease pain, improve strength and function.       PATIENT EDUCATION: Education details: HEP, there-ex Person educated: Patient Education method: Explanation, Demonstration, Tactile cues, Verbal cues, and Handouts Education comprehension: verbalized understanding and returned demonstration  HOME EXERCISE PROGRAM: Access Code: [email or web address] URL: [email or web address] Date: 03/21/2022 Prepared by: Joneen Boers  Exercises - Seated Hip Abduction with Resistance  - 1 x daily - 7 x weekly - 2 sets - 10 reps  Red band  - Seated Hip Adduction Isometrics with Ball  - 1 x daily - 7 x weekly - 3 sets - 10 reps - 5 seconds hold  - Standing Hip Abduction with Counter Support  - 1 x daily - 7 x weekly - 2-3 sets - 10 reps  Reclined   Hooklying   Hip extension isometrics    L 10x5 seconds for 3 sets     R 10x5 seconds for 3 sets   PT Short Term Goals - 03/13/22 1815       PT SHORT TERM GOAL #1   Title Pt will be independent with her initial HEP to decrease R thigh, knee and back pain, improve B LE strength and ability to ambulate with less difficulty.    Time 3    Period Weeks    Status New     Target Date 04/05/22              PT Long Term Goals - 03/21/22 1630       PT LONG TERM GOAL #1   Title Pt will have a decrease in R knee pain to 4/10 or less at worst to promote ability to ambulate with less difficulty.    Baseline R knee pain 9/10 at worst for the past 3 months (03/13/2022)    Time 8    Period Weeks    Status New    Target Date 05/10/22      PT LONG TERM GOAL #2   Title Pt will have a decrease in mid back pain to 4/10 or less at worst to promote ability to ambulate, perform standing tasks with less difficulty.    Baseline 10/10 mid back pain at worst for the past 3-4 days (03/13/2022)    Time 8    Period Weeks    Status New    Target Date 05/10/22      PT LONG TERM GOAL #3   Title Pt will improve B hip flexion, extension, abduction, and knee extension strength by at least 1/2 MMT grade to promote ability to ambulate with less difficulty.    Baseline Hip flexion 4-/5 R, 3/5 L, hip extension 4-/5 R and L, hip abduction 4-/5 R, 3+/5 L, knee extension 4+/5 R, 3-/5 L (03/13/2022)    Time 8    Period Weeks    Status New    Target Date 05/10/22      PT LONG TERM GOAL #4   Title Pt will improve her FOTO  score by at least 10 points as a demonstration of improved function.    Baseline Initial FOTO score not yet obtained. (03/13/2022); Upper leg FOTO 39 (03/21/2022)    Time 8    Period Weeks    Status New    Target Date 05/10/22              Plan - 04/02/22 1330     Clinical Impression Statement Worked on improving B hip extension and L hip abduction strength to promote ability to ambulate and perform transfers with less difficulty as well as decreasing stress to her back.  Pt tolerated session well without aggravation of symptoms. Pt will benefit from continued skilled physical therapy services to decrease pain, improve strength and function.    Personal Factors and Comorbidities Comorbidity 3+;Past/Current Experience;Time since onset of  injury/illness/exacerbation;Fitness    Comorbidities Anxiety, arthritis, COPD, DM, depression, HTN    Examination-Activity Limitations Stairs;Lift;Caring for Others;Transfers;Carry;Locomotion Level;Squat    Stability/Clinical Decision Making Stable/Uncomplicated    Rehab Potential Fair    PT Frequency 2x / week    PT Duration 8 weeks    PT Treatment/Interventions Electrical Stimulation;Iontophoresis '4mg'$ /ml Dexamethasone;Gait training;Stair training;Functional mobility training;Therapeutic activities;Therapeutic exercise;Balance training;Neuromuscular re-education;Patient/family education;Manual techniques;Dry needling    PT Next Visit Plan FU on thoracic extension intervention and myofascial release to Rt periscapular zone    PT Home Exercise Plan no changes to program this visit; discussed trial of throacic towel roll if desired, but prefer to try this here first a few times before adding to home program    Consulted and Agree with Plan of Care Patient              Joneen Boers PT, DPT  04/02/2022, 2:39 PM

## 2022-04-04 ENCOUNTER — Telehealth: Payer: Self-pay

## 2022-04-04 NOTE — Telephone Encounter (Signed)
Patient states she was in the hospital for a day for this last year and has had 3 attacks since then. Informed patient I would call her if anything come open sooner to get her in and she is on the wait list

## 2022-04-04 NOTE — Telephone Encounter (Signed)
Patient called and left a voicemail on my phone 04/03/2022 and she states she is a diabetic and keep having gastroparesis flair up. She states she wants to know if she can be seen sooner. Called and left a message for patient to give [place] a call back to find out more information about symptoms

## 2022-04-05 ENCOUNTER — Encounter: Payer: Self-pay | Admitting: Psychiatry

## 2022-04-05 ENCOUNTER — Ambulatory Visit (INDEPENDENT_AMBULATORY_CARE_PROVIDER_SITE_OTHER): Payer: Medicare Other | Admitting: Psychiatry

## 2022-04-05 VITALS — BP 183/82 | HR 76 | Temp 97.5°F | Ht 62.0 in | Wt 206.0 lb

## 2022-04-05 DIAGNOSIS — F331 Major depressive disorder, recurrent, moderate: Secondary | ICD-10-CM

## 2022-04-05 DIAGNOSIS — G4701 Insomnia due to medical condition: Secondary | ICD-10-CM | POA: Diagnosis not present

## 2022-04-05 DIAGNOSIS — F411 Generalized anxiety disorder: Secondary | ICD-10-CM | POA: Diagnosis not present

## 2022-04-05 MED ORDER — FLUOXETINE HCL 40 MG PO CAPS: 40.0000 mg | ORAL_CAPSULE | Freq: Every day | ORAL | 0 refills | Status: DC

## 2022-04-05 MED ORDER — FLUOXETINE HCL 20 MG PO CAPS: 20.0000 mg | ORAL_CAPSULE | Freq: Every day | ORAL | 0 refills | Status: DC

## 2022-04-05 MED ORDER — FLUOXETINE HCL 20 MG PO CAPS: 20.0000 mg | ORAL_CAPSULE | Freq: Every day | ORAL | 1 refills | Status: DC

## 2022-04-05 NOTE — Telephone Encounter (Addendum)
Made appointment for 12/14 and informed patient and she states she will be there

## 2022-04-05 NOTE — Telephone Encounter (Signed)
I can see her on Thursday next week if she is willing to.  RV
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
## 2022-04-05 NOTE — Telephone Encounter (Signed)
Do you want to work her in sooner or wait to see if we have a cancellation

## 2022-04-09 ENCOUNTER — Ambulatory Visit: Payer: Medicare Other

## 2022-04-12 ENCOUNTER — Ambulatory Visit (INDEPENDENT_AMBULATORY_CARE_PROVIDER_SITE_OTHER): Payer: Medicare Other | Admitting: Gastroenterology

## 2022-04-12 ENCOUNTER — Encounter: Payer: Self-pay | Admitting: Gastroenterology

## 2022-04-12 VITALS — BP 151/96 | HR 98 | Temp 98.0°F | Ht 62.0 in | Wt 206.0 lb

## 2022-04-12 DIAGNOSIS — R112 Nausea with vomiting, unspecified: Secondary | ICD-10-CM | POA: Diagnosis not present

## 2022-04-12 DIAGNOSIS — K760 Fatty (change of) liver, not elsewhere classified: Secondary | ICD-10-CM

## 2022-04-12 DIAGNOSIS — K8689 Other specified diseases of pancreas: Secondary | ICD-10-CM

## 2022-04-12 DIAGNOSIS — K5909 Other constipation: Secondary | ICD-10-CM | POA: Diagnosis not present

## 2022-04-12 MED ORDER — ONDANSETRON HCL 4 MG PO TABS: 4.0000 mg | ORAL_TABLET | ORAL | 1 refills | Status: DC | PRN

## 2022-04-12 NOTE — Patient Instructions (Signed)
Gastric Emptying on 04/27/2022 Check in at 9:00 am for the appointment at 9:30 am  No food, drink, and no stomach medications after midnight   Done only on Fridays   [phone]

## 2022-04-12 NOTE — Progress Notes (Signed)
Cephas Darby, MD [address]  [place]  [place], [place] [address]  Main: [phone]  Fax: [phone]    Gastroenterology Consultation  Referring Provider:     Konrad Saha, MD Primary Care Physician:  Konrad Saha, MD Primary Gastroenterologist:  Dr. Cephas Darby Reason for Consultation: Chronic nausea, vomiting, chronic constipation        HPI:   Traci Davis is a 51 y.o. female referred by Dr. Konrad Saha, MD  for consultation & management of chronic nausea and vomiting, chronic constipation.  Patient called our office because of recent worsening of symptoms of nausea and vomiting and she is concerned if she has flareup of gastroparesis.  Patient was told that she has gastroparesis, although she was not officially diagnosed with it based on gastric emptying study.  She takes promethazine 25 mg as needed which she thinks is not working.  She admits to drinking 2 diet Cokes daily and sweet tea 2-3 times daily.  She has craving for sweet and consumes 4-5 times daily.  She is taking omeprazole 40 mg twice daily which keeps her reflux under control.  She is taking Linzess 145 mcg daily to keep her constipation under control.  She reports abdominal bloating, regurgitation of food, chronic nausea.  She does have history of fatty liver.  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 Cells for cytology obtained.                         - Normal mucosa was found in the entire esophagus.                         Biopsied. Dilated.                         - Z-line regular, 36 cm from the incisors.                         - Multiple fundic gland polyps.                         - Erythematous mucosa in the stomach. Biopsied.                          - Normal examined duodenum.  Diagnosis   A: Stomach, biopsy -Gastric mucosa with changes consistent with mild reactive gastropathy -No Helicobacter pylori identified on H&E stain -No evidence of metaplasia or dysplasia   B: Esophagus, biopsy -Fragments of esophageal squamous epithelium with no diagnostic alterations including no evidence of metaplasia or dysplasia (no significant glandular mucosal component included) -PAS stain negative for fungal elements     Past Medical History:  Diagnosis Date   Anxiety    Arthritis    Asthma    Cerebral palsy (HCC)    Complication of anesthesia    panic attacks before surgery   COPD (chronic obstructive pulmonary disease) (HCC)    Depression    Diabetes mellitus without complication (  [place])    GERD (gastroesophageal reflux disease)    Hypertension     Past Surgical History:  Procedure Laterality Date   CESAREAN SECTION  1995   CHOLECYSTECTOMY     DILATATION & CURETTAGE/HYSTEROSCOPY WITH MYOSURE N/A 02/20/2018   Procedure: DILATATION & CURETTAGE/HYSTEROSCOPY WITH MYOSURE ENDOMETRIAL POLYPECTOMY;  Surgeon: Will Bonnet, MD;  Location: [facility];  Service: Gynecology;  Laterality: N/A;   ESOPHAGOGASTRODUODENOSCOPY (EGD) WITH PROPOFOL N/A 01/01/2019   Procedure: ESOPHAGOGASTRODUODENOSCOPY (EGD) WITH PROPOFOL;  Surgeon: Lin Landsman, MD;  Location: [facility];  Service: Gastroenterology;  Laterality: N/A;   FOOT CAPSULE RELEASE W/ PERCUTANEOUS HEEL CORD LENGTHENING, TIBIAL TENDON TRANSFER     age 9 ,and 2010-both legs   JOINT REPLACEMENT Right    both knees partial replacements dates unknown   knee replacment     x 5 per pt. last one- left knee 2014. has had 2 on R 3 on L   TYMPANOPLASTY WITH GRAFT Right 01/08/2018   Procedure: TYMPANOPLASTY WITH POSSIBLE OSSICULAR CHAIN RECONSTRUCTION;  Surgeon: [name], MD;  Location: [facility];  Service: ENT;  Laterality: Right;     Current Outpatient Medications:     acetaminophen (TYLENOL) 325 MG tablet, Take 2 tablets (650 mg total) by mouth every 6 (six) hours as needed., Disp: 60 tablet, Rfl: 0   albuterol (PROVENTIL HFA;VENTOLIN HFA) 108 (90 BASE) MCG/ACT inhaler, Inhale 2 puffs into the lungs 4 (four) times daily as needed. For shortness of breath and/or wheezing, Disp: , Rfl:    ANORO ELLIPTA 62.5-25 MCG/INH AEPB, , Disp: , Rfl:    atorvastatin (LIPITOR) 20 MG tablet, Take 1 tablet by mouth daily., Disp: , Rfl:    azelastine (ASTELIN) 0.1 % nasal spray, Place into both nostrils., Disp: , Rfl:    Blood Glucose Monitoring Suppl (GLUCOCOM BLOOD GLUCOSE MONITOR) DEVI, Test daily before all meals/snacks and once before bedtime., Disp: , Rfl:    budesonide-formoterol (SYMBICORT) 80-4.5 MCG/ACT inhaler, Inhale 2 puffs up to 4 times daily as needed for symptoms of wheezing or shortness of breath., Disp: , Rfl:    cloNIDine (CATAPRES - DOSED IN MG/24 HR) 0.1 mg/24hr patch, Place 1 patch (0.1 mg total) onto the skin once a week. (Patient not taking: Reported on 04/05/2022), Disp: 4 patch, Rfl: 11   Continuous Blood Gluc Receiver (DEXCOM G6 RECEIVER) DEVI, 1 Units by Miscellaneous route continuous., Disp: , Rfl:    Continuous Blood Gluc Transmit (DEXCOM G6 TRANSMITTER) MISC, 1 Units by Miscellaneous route Every three (3) months., Disp: , Rfl:    CREON 36000 units CPEP capsule, , Disp: , Rfl:    cyclobenzaprine (FLEXERIL) 10 MG tablet, Take by mouth., Disp: , Rfl:    diazepam (VALIUM) 5 MG tablet, Take by mouth., Disp: , Rfl:    diclofenac Sodium (VOLTAREN) 1 % GEL, Apply topically., Disp: , Rfl:    dicyclomine (BENTYL) 10 MG capsule, Take by mouth., Disp: , Rfl:    donepezil (ARICEPT) 10 MG tablet, Take by mouth., Disp: , Rfl:    fluconazole (DIFLUCAN) 150 MG tablet, Take 150 mg by mouth once., Disp: , Rfl:    FLUoxetine (PROZAC) 20 MG capsule, Take 1 capsule (20 mg total) by mouth daily. Take along with 40 mg daily - total of 60 mg daily, Disp: 30 capsule, Rfl:  1   FLUoxetine (PROZAC) 20 MG capsule, Take 1 capsule (20 mg total) by mouth daily for 15 days. Take along with 40 mg daily, Disp: 15 capsule, Rfl: 0  FLUoxetine (PROZAC) 40 MG capsule, Take 1 capsule (40 mg total) by mouth daily. Take along with 20 mg daily., Disp: 90 capsule, Rfl: 0   fluticasone (FLONASE) 50 MCG/ACT nasal spray, 1 spray into each nostril two (2) times a day., Disp: , Rfl:    furosemide (LASIX) 40 MG tablet, Take 40 mg by mouth daily as needed for fluid. , Disp: , Rfl:    GLOBAL EASE INJECT PEN NEEDLES 31G X 5 MM MISC, Inject into the skin., Disp: , Rfl:    glucose blood test strip, USE TO TEST BLOOD SUGAR TWO TIMES A DAY, Disp: , Rfl:    HYDROmorphone (DILAUDID) 2 MG tablet, Take 2 mg by mouth 4 (four) times daily., Disp: , Rfl:    incobotulinumtoxinA (XEOMIN) 100 units SOLR injection, Inject 100 Units into the muscle every 3 (three) months. , Disp: , Rfl:    insulin glargine (LANTUS) 100 UNIT/ML Solostar Pen, Inject into the skin., Disp: , Rfl:    Lancets (FREESTYLE) lancets, Test daily before all meals/snacks, Disp: , Rfl:    LANTUS SOLOSTAR 100 UNIT/ML Solostar Pen, Inject into the skin., Disp: , Rfl:    lidocaine (LIDODERM) 5 %, Place onto the skin., Disp: , Rfl:    LINZESS 145 MCG CAPS capsule, Take 1 capsule by mouth every morning  for constipation, Disp: , Rfl:    lisinopril (ZESTRIL) 20 MG tablet, Take 1 tablet by mouth daily., Disp: , Rfl:    meclizine (ANTIVERT) 25 MG tablet, Take by mouth., Disp: , Rfl:    Melatonin 10 MG TBCR, Take by mouth., Disp: , Rfl:    metFORMIN (GLUCOPHAGE-XR) 500 MG 24 hr tablet, Take 1,000 mg by mouth 2 (two) times daily., Disp: , Rfl:    mirabegron ER (MYRBETRIQ) 25 MG TB24 tablet, Take 1 tablet by mouth daily., Disp: , Rfl:    mirtazapine (REMERON) 15 MG tablet, TAKE 1 TABLET BY MOUTH AT  BEDTIME FOR SLEEP, Disp: 90 tablet, Rfl: 3   montelukast (SINGULAIR) 10 MG tablet, Take 1 tablet by mouth daily., Disp: , Rfl:    naloxone (NARCAN)  nasal spray 4 mg/0.1 mL, One spray in either nostril once for known/suspected opioid overdose. May repeat every 2-3 minutes in alternating nostril til EMS arrives, Disp: , Rfl:    nystatin (MYCOSTATIN/NYSTOP) powder, Apply topically., Disp: , Rfl:    omeprazole (PRILOSEC) 40 MG capsule, Take by mouth., Disp: , Rfl:    ondansetron (ZOFRAN) 4 MG tablet, Take 1 tablet (4 mg total) by mouth as needed for nausea or vomiting., Disp: 30 tablet, Rfl: 1   oxyCODONE (OXY IR/ROXICODONE) 5 MG immediate release tablet, Take 5 mg by mouth 4 (four) times daily as needed., Disp: , Rfl:    oxyCODONE-acetaminophen (PERCOCET) 10-325 MG tablet, Take 1 tablet by mouth 3 (three) times daily as needed., Disp: , Rfl:    promethazine (PHENERGAN) 25 MG tablet, Take by mouth., Disp: , Rfl:    XTAMPZA ER 13.5 MG C12A, Take by mouth daily as needed., Disp: , Rfl:    Family History  Problem Relation Age of Onset   CAD Father    Heart attack Brother    Sexual abuse Paternal Grandfather    Breast cancer Neg Hx      Social History   Tobacco Use   Smoking status: Never   Smokeless tobacco: Never  Vaping Use   Vaping Use: Never used  Substance Use Topics   Alcohol use: No   Drug use: No  Allergies as of 04/12/2022 - Review Complete 04/05/2022  Allergen Reaction Noted   Meloxicam Other (See Comments) 02/19/2015   Morphine and related Other (See Comments) 02/19/2015   Vantin [cefpodoxime] Nausea And Vomiting 02/19/2015   Latex Itching 01/08/2018   Baclofen Other (See Comments) 02/19/2015   Ciprofloxacin Itching and Nausea And Vomiting 02/19/2015   Tape Rash 02/19/2015   Tramadol Nausea Only 12/10/2019    Review of Systems:    All systems reviewed and negative except where noted in HPI.   Physical Exam:  BP (!) 151/96   Pulse 98   Temp 98 F (36.7 C)   Ht '5\' 2"'$  (1.575 m)   Wt 206 lb (93.4 kg)   LMP 08/20/2014 Comment: missed cup when trying to obtain urine spec  BMI 37.68 kg/m  Patient's last  menstrual period was 08/20/2014.  General:   Alert,  Well-developed, well-nourished, pleasant and cooperative in NAD Head:  Normocephalic and atraumatic. Eyes:  Sclera clear, no icterus.   Conjunctiva pink. Ears:  Normal auditory acuity. Nose:  No deformity, discharge, or lesions. Mouth:  No deformity or lesions,oropharynx pink & moist. Neck:  Supple; no masses or thyromegaly. Lungs:  Respirations even and unlabored.  Clear throughout to auscultation.   No wheezes, crackles, or rhonchi. No acute distress. Heart:  Regular rate and rhythm; no murmurs, clicks, rubs, or gallops. Abdomen:  Normal bowel sounds. Soft, obese, distended, tympanic non-tender and distended without masses, hepatosplenomegaly or hernias noted.  No guarding or rebound tenderness.   Rectal: Not performed Msk: Left lower extremity weakness from cerebral palsy, wearing a splint Pulses:  Normal pulses noted. Extremities:  No clubbing or edema.  No cyanosis. Neurologic:  Alert and oriented x3;  grossly normal neurologically. Skin:  Intact without significant lesions or rashes. No jaundice. Psych:  Alert and cooperative. Normal mood and affect.  Imaging Studies: Reviewed  Assessment and Plan:   Traci Davis is a 51 y.o. female with metabolic syndrome, history of cerebral palsy with left lower extremity weakness, s/p cholecystectomy, fatty atrophy of the pancreas, hepatic steatosis, chronic constipation is seen in consultation for chronic symptoms of nausea and intermittent vomiting  Chronic nausea with intermittent vomiting Upper [facility] in 01/2021 was unremarkable including biopsies Discussed with patient that most of her symptoms are likely driven by her food habits, heavy consumption of carbonated beverages.  Discussed in length regarding illumination of carbonated beverages, sweets, fatty foods, greasy foods She can continue taking Creon 36 K 1 to 2 capsules with each meal and 1 with snack Discussed about  gastric emptying study to evaluate for gastroparesis Zofran 4 mg and Phenergan 25 mg as needed for nausea  Fatty atrophy of the pancreas Patient is empirically started on Creon.  Pancreatic fecal elastase was not checked  Chronically elevated LFTs with hepatic steatosis: In setting of metabolic syndrome Discussed with patient regarding tight control of diabetes, significance of low-fat, low-carb diet Recheck LFTs in 6 months  Chronic constipation Continue Linzess 145 mcg daily  Follow up in 6 months   Cephas Darby, MD

## 2022-04-16 ENCOUNTER — Ambulatory Visit: Payer: Medicare Other

## 2022-04-16 DIAGNOSIS — R262 Difficulty in walking, not elsewhere classified: Secondary | ICD-10-CM | POA: Diagnosis not present

## 2022-04-16 DIAGNOSIS — G801 Spastic diplegic cerebral palsy: Secondary | ICD-10-CM

## 2022-04-16 DIAGNOSIS — G8929 Other chronic pain: Secondary | ICD-10-CM

## 2022-04-16 DIAGNOSIS — M6281 Muscle weakness (generalized): Secondary | ICD-10-CM

## 2022-04-16 DIAGNOSIS — M5459 Other low back pain: Secondary | ICD-10-CM

## 2022-04-16 NOTE — Therapy (Signed)
OUTPATIENT PHYSICAL THERAPY TREATMENT NOTE   Patient Name: Traci Davis MRN: [id number] DOB:[birth year], 51 y.o., female Today's Date: 04/16/2022  PCP: Konrad Saha, MD  REFERRING PROVIDER: Leretha Dykes, DO    END OF SESSION:  PT End of Session - 04/16/22 1451     Number of Visits 17    Date for PT Re-Evaluation 05/10/22    Authorization Type [employer] Medicare    Authorization Time Period 03/13/22-05/10/22    Progress Note Due on Visit 10    PT Start Time 1415    PT Stop Time 1501    PT Time Calculation (min) 46 min    Activity Tolerance Patient tolerated treatment well;No increased pain    Behavior During Therapy [facility] for tasks assessed/performed                Past Medical History:  Diagnosis Date   Acute pancreatitis 09/04/2014   Anxiety    Arthritis    Asthma    Cerebral palsy (HCC)    Complication of anesthesia    panic attacks before surgery   COPD (chronic obstructive pulmonary disease) ([place])    Depression    Diabetes mellitus without complication ([place])    GERD (gastroesophageal reflux disease)    Hypertension    Noninfectious gastroenteritis and colitis 01/10/2013   Past Surgical History:  Procedure Laterality Date   CESAREAN SECTION  [place] N/A 02/20/2018   Procedure: DILATATION & CURETTAGE/HYSTEROSCOPY WITH MYOSURE ENDOMETRIAL POLYPECTOMY;  Surgeon: Will Bonnet, MD;  Location: [facility];  Service: Gynecology;  Laterality: N/A;   ESOPHAGOGASTRODUODENOSCOPY (EGD) WITH PROPOFOL N/A 01/01/2019   Procedure: ESOPHAGOGASTRODUODENOSCOPY (EGD) WITH PROPOFOL;  Surgeon: Lin Landsman, MD;  Location: [facility];  Service: Gastroenterology;  Laterality: N/A;   FOOT CAPSULE RELEASE W/ PERCUTANEOUS HEEL CORD LENGTHENING, TIBIAL TENDON TRANSFER     age 58 ,and 2010-both legs   JOINT REPLACEMENT Right    both knees partial replacements dates unknown   knee replacment      x 5 per pt. last one- left knee 2014. has had 2 on R 3 on L   TYMPANOPLASTY WITH GRAFT Right 01/08/2018   Procedure: TYMPANOPLASTY WITH POSSIBLE OSSICULAR CHAIN RECONSTRUCTION;  Surgeon: [name], MD;  Location: [facility];  Service: ENT;  Laterality: Right;   Patient Active Problem List   Diagnosis Date Noted   Medication reaction 08/29/2021   No-show for appointment 12/22/2020   Obesity due to excess calories 09/22/2020   Nausea 09/20/2020   Sinusitis 08/09/2020   Insomnia due to medical condition 05/11/2020   COPD (chronic obstructive pulmonary disease) ([place]) 03/22/2020   Hamstring tendonitis 01/29/2020   MDD (major depressive disorder), recurrent, in full remission ([place]) 08/12/2019   Seizure-like activity ([place]) 07/08/2019   Mild episode of recurrent major depressive disorder ([place]) 12/29/2018   Insomnia due to mental condition 12/29/2018   Disorder of vein 03/04/2018   Lumbar sprain 03/04/2018   Muscle weakness 03/04/2018   Neck sprain 03/04/2018   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   Postmenopausal bleeding 02/18/2018   Impingement syndrome of shoulder region 02/04/2018   Chest pain with moderate risk for cardiac etiology 05/19/2017   History of depression 04/15/2017   GAD (generalized anxiety disorder) 04/15/2017   Chronic daily headache 04/15/2017   Panic attack 04/15/2017   Nocturnal enuresis 04/15/2017   Mild cognitive impairment 04/15/2017   Loss of memory 01/14/2017   Pain medication agreement signed  01/08/2017   Headache disorder 12/04/2016   Chronic, continuous use of opioids 07/01/2015   Vertigo 07/01/2015   Chronic midline low back pain without sciatica 03/21/2015   Acute renal failure (ARF) ([place]) 02/19/2015   Type 2 diabetes mellitus without complication ([place]) 01/20/3006   Cerebral palsy ([place]) 01/05/2014   Cerebral palsy with spastic/ataxic diplegia 01/05/2014   Hip pain, chronic 04/28/2013   Essential hypertension 01/10/2013   Irritable colon 01/10/2013    Encounter for long-term (current) use of other medications 12/04/2012   Chronic GERD 08/12/2012   Epigastric pain 08/12/2012   Diarrhea 08/12/2012   Primary localized osteoarthrosis, lower leg 05/07/2012   Difficulty walking 02/06/2012    REFERRING DIAG: G80.1 (ICD-10-CM) - 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M79.604 (ICD-10-CM) - Pain in right leg M79.605 (ICD-10-CM) - Pain in left leg   THERAPY DIAG:  Difficulty in walking, not elsewhere classified  Chronic pain of right knee  Muscle weakness (generalized)  Other low back pain  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(HCC)  Rationale for Evaluation and Treatment Rehabilitation  PERTINENT HISTORY: Pain in R and L legs, spastic diplegic cerebral palsy. Hurt her R mid back, feels like she pulled a muscle, making it difficult for her to reach across. Feels like someone is grinding their knuckles on her R mid back, feels like she pulled a muscle. R knee and knee cap has been bothering her. L LE has been dragging worse than last time. Takes fluid pills for swelling in her legs. No L LE pain. Recently got a new KAFO and finally broke them in (was way too tight when pt first put it on. Both legs feel weak. Has a knee brace for her R knee cap to help keep it from collapsing. Got trigger point injections for her neck and back this month (November 2023). L LE feels weak.   PRECAUTIONS: fall risk  SUBJECTIVE: Was told that her degenerative discs in her low back is getting worse. The back doctor does not have anything to operate on. Pt states participating in PT usually makes her back feel better.    SUBJECTIVE STATEMENT:   PAIN:  Are you having pain? 10/10 R low back pain,      TODAY'S TREATMENT:                                                                                                                                         DATE: 04/16/2022    Latex Allergies   Therapeutic exercise   Reclined   Hooklying      Posterior pelvic tilt  10x5 seconds for 3 sets    Crunches 10x2    Hip eccentric with PT manual resistance    L 5x2    Manually resisted leg press    R 10x3    L 10x2   Gait from treatment room to vehicle for safety    Improved exercise  technique, movement at target joints, use of target muscles after mod verbal, visual, tactile cues.    Response to treatment Pt tolerated session well without aggravation of symptoms. Decreased low back pain reported after session.         Clinical impression  Worked on improving trunk and glute med and max strength to help decrease extension stress to low back. Weak core strength observed with exercises. Decreased low back pain reported after session.  Pt will benefit from continued skilled physical therapy services to decrease pain, improve strength and function.       PATIENT EDUCATION: Education details: HEP, there-ex Person educated: Patient Education method: Explanation, Demonstration, Tactile cues, Verbal cues, and Handouts Education comprehension: verbalized understanding and returned demonstration  HOME EXERCISE PROGRAM: Access Code: [email or web address] URL: [email or web address] Date: 03/21/2022 Prepared by: Joneen Boers  Exercises - Seated Hip Abduction with Resistance  - 1 x daily - 7 x weekly - 2 sets - 10 reps  Red band  - Seated Hip Adduction Isometrics with Ball  - 1 x daily - 7 x weekly - 3 sets - 10 reps - 5 seconds hold  - Standing Hip Abduction with Counter Support  - 1 x daily - 7 x weekly - 2-3 sets - 10 reps  Reclined   Hooklying   Hip extension isometrics    L 10x5 seconds for 3 sets     R 10x5 seconds for 3 sets  - Supine Posterior Pelvic Tilt  - 1 x daily - 7 x weekly - 3 sets - 10 reps - 5 seconds hold    PT Short Term Goals - 03/13/22 1815       PT SHORT TERM GOAL #1   Title Pt will be independent with her initial HEP to decrease R thigh, knee and back pain, improve B LE strength and ability to ambulate with  less difficulty.    Time 3    Period Weeks    Status New    Target Date 04/05/22              PT Long Term Goals - 03/21/22 1630       PT LONG TERM GOAL #1   Title Pt will have a decrease in R knee pain to 4/10 or less at worst to promote ability to ambulate with less difficulty.    Baseline R knee pain 9/10 at worst for the past 3 months (03/13/2022)    Time 8    Period Weeks    Status New    Target Date 05/10/22      PT LONG TERM GOAL #2   Title Pt will have a decrease in mid back pain to 4/10 or less at worst to promote ability to ambulate, perform standing tasks with less difficulty.    Baseline 10/10 mid back pain at worst for the past 3-4 days (03/13/2022)    Time 8    Period Weeks    Status New    Target Date 05/10/22      PT LONG TERM GOAL #3   Title Pt will improve B hip flexion, extension, abduction, and knee extension strength by at least 1/2 MMT grade to promote ability to ambulate with less difficulty.    Baseline Hip flexion 4-/5 R, 3/5 L, hip extension 4-/5 R and L, hip abduction 4-/5 R, 3+/5 L, knee extension 4+/5 R, 3-/5 L (03/13/2022)    Time 8    Period Weeks    Status New  Target Date 05/10/22      PT LONG TERM GOAL #4   Title Pt will improve her FOTO score by at least 10 points as a demonstration of improved function.    Baseline Initial FOTO score not yet obtained. (03/13/2022); Upper leg FOTO 39 (03/21/2022)    Time 8    Period Weeks    Status New    Target Date 05/10/22              Plan - 04/16/22 1416     Clinical Impression Statement Worked on improving trunk and glute med and max strength to help decrease extension stress to low back. Weak core strength observed with exercises. Decreased low back pain reported after session.  Pt will benefit from continued skilled physical therapy services to decrease pain, improve strength and function.    Personal Factors and Comorbidities Comorbidity 3+;Past/Current Experience;Time since onset  of injury/illness/exacerbation;Fitness    Comorbidities Anxiety, arthritis, COPD, DM, depression, HTN    Examination-Activity Limitations Stairs;Lift;Caring for Others;Transfers;Carry;Locomotion Level;Squat    Stability/Clinical Decision Making Stable/Uncomplicated    Rehab Potential Fair    PT Frequency 2x / week    PT Duration 8 weeks    PT Treatment/Interventions Electrical Stimulation;Iontophoresis '4mg'$ /ml Dexamethasone;Gait training;Stair training;Functional mobility training;Therapeutic activities;Therapeutic exercise;Balance training;Neuromuscular re-education;Patient/family education;Manual techniques;Dry needling    PT Next Visit Plan FU on thoracic extension intervention and myofascial release to Rt periscapular zone    PT Home Exercise Plan no changes to program this visit; discussed trial of throacic towel roll if desired, but prefer to try this here first a few times before adding to home program    Consulted and Agree with Plan of Care Patient               Joneen Boers PT, DPT  04/16/2022, 3:09 PM

## 2022-04-18 ENCOUNTER — Ambulatory Visit: Payer: Medicare Other

## 2022-04-18 DIAGNOSIS — G8929 Other chronic pain: Secondary | ICD-10-CM

## 2022-04-18 DIAGNOSIS — M6281 Muscle weakness (generalized): Secondary | ICD-10-CM

## 2022-04-18 DIAGNOSIS — R262 Difficulty in walking, not elsewhere classified: Secondary | ICD-10-CM | POA: Diagnosis not present

## 2022-04-18 NOTE — Therapy (Signed)
OUTPATIENT PHYSICAL THERAPY TREATMENT NOTE   Patient Name: Traci Davis MRN: [id number] DOB:November 19, 1970, 51 y.o., female Today's Date: 04/18/2022  PCP: Konrad Saha, MD  REFERRING PROVIDER: Leretha Dykes, DO    END OF SESSION:  PT End of Session - 04/18/22 1416     Visit Number 7    Number of Visits 17    Date for PT Re-Evaluation 05/10/22    Authorization Type [employer]    Authorization Time Period 03/13/22-05/10/22    Progress Note Due on Visit 10    PT Start Time 1416    PT Stop Time 1458    PT Time Calculation (min) 42 min    Activity Tolerance Patient tolerated treatment well;No increased pain    Behavior During Therapy [facility] for tasks assessed/performed                 Past Medical History:  Diagnosis Date   Acute pancreatitis 09/04/2014   Anxiety    Arthritis    Asthma    Cerebral palsy (HCC)    Complication of anesthesia    panic attacks before surgery   COPD (chronic obstructive pulmonary disease) ([place])    Depression    Diabetes mellitus without complication ([place])    GERD (gastroesophageal reflux disease)    Hypertension    Noninfectious gastroenteritis and colitis 01/10/2013   Past Surgical History:  Procedure Laterality Date   CESAREAN SECTION  [place] N/A 02/20/2018   Procedure: DILATATION & CURETTAGE/HYSTEROSCOPY WITH MYOSURE ENDOMETRIAL POLYPECTOMY;  Surgeon: Will Bonnet, MD;  Location: [facility];  Service: Gynecology;  Laterality: N/A;   ESOPHAGOGASTRODUODENOSCOPY (EGD) WITH PROPOFOL N/A 01/01/2019   Procedure: ESOPHAGOGASTRODUODENOSCOPY (EGD) WITH PROPOFOL;  Surgeon: Lin Landsman, MD;  Location: [place];  Service: Gastroenterology;  Laterality: N/A;   FOOT CAPSULE RELEASE W/ PERCUTANEOUS HEEL CORD LENGTHENING, TIBIAL TENDON TRANSFER     age 83 ,and 2010-both legs   JOINT REPLACEMENT Right    both knees partial replacements dates unknown    knee replacment     x 5 per pt. last one- left knee 2014. has had 2 on R 3 on L   TYMPANOPLASTY WITH GRAFT Right 01/08/2018   Procedure: TYMPANOPLASTY WITH POSSIBLE OSSICULAR CHAIN RECONSTRUCTION;  Surgeon: [name], MD;  Location: [facility];  Service: ENT;  Laterality: Right;   Patient Active Problem List   Diagnosis Date Noted   Medication reaction 08/29/2021   No-show for appointment 12/22/2020   Obesity due to excess calories 09/22/2020   Nausea 09/20/2020   Sinusitis 08/09/2020   Insomnia due to medical condition 05/11/2020   COPD (chronic obstructive pulmonary disease) ([place]) 03/22/2020   Hamstring tendonitis 01/29/2020   MDD (major depressive disorder), recurrent, in full remission ([place]) 08/12/2019   Seizure-like activity ([place]) 07/08/2019   Mild episode of recurrent major depressive disorder ([place]) 12/29/2018   Insomnia due to mental condition 12/29/2018   Disorder of vein 03/04/2018   Lumbar sprain 03/04/2018   Muscle weakness 03/04/2018   Neck sprain 03/04/2018   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   Postmenopausal bleeding 02/18/2018   Impingement syndrome of shoulder region 02/04/2018   Chest pain with moderate risk for cardiac etiology 05/19/2017   History of depression 04/15/2017   GAD (generalized anxiety disorder) 04/15/2017   Chronic daily headache 04/15/2017   Panic attack 04/15/2017   Nocturnal enuresis 04/15/2017   Mild cognitive impairment 04/15/2017   Loss of memory  01/14/2017   Pain medication agreement signed 01/08/2017   Headache disorder 12/04/2016   Chronic, continuous use of opioids 07/01/2015   Vertigo 07/01/2015   Chronic midline low back pain without sciatica 03/21/2015   Acute renal failure (ARF) ([place]) 02/19/2015   Type 2 diabetes mellitus without complication ([place]) 92/42/6834   Cerebral palsy ([place]) 01/05/2014   Cerebral palsy with spastic/ataxic diplegia 01/05/2014   Hip pain, chronic 04/28/2013   Essential hypertension 01/10/2013   Irritable  colon 01/10/2013   Encounter for long-term (current) use of other medications 12/04/2012   Chronic GERD 08/12/2012   Epigastric pain 08/12/2012   Diarrhea 08/12/2012   Primary localized osteoarthrosis, lower leg 05/07/2012   Difficulty walking 02/06/2012    REFERRING DIAG: G80.1 (ICD-10-CM) - 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M79.604 (ICD-10-CM) - Pain in right leg M79.605 (ICD-10-CM) - Pain in left leg   THERAPY DIAG:  Difficulty in walking, not elsewhere classified  Chronic pain of right knee  Muscle weakness (generalized)  Rationale for Evaluation and Treatment Rehabilitation  PERTINENT HISTORY: Pain in R and L legs, spastic diplegic cerebral palsy. Hurt her R mid back, feels like she pulled a muscle, making it difficult for her to reach across. Feels like someone is grinding their knuckles on her R mid back, feels like she pulled a muscle. R knee and knee cap has been bothering her. L LE has been dragging worse than last time. Takes fluid pills for swelling in her legs. No L LE pain. Recently got a new KAFO and finally broke them in (was way too tight when pt first put it on. Both legs feel weak. Has a knee brace for her R knee cap to help keep it from collapsing. Got trigger point injections for her neck and back this month (November 2023). L LE feels weak.   PRECAUTIONS: fall risk  SUBJECTIVE: R knee has been bothering her for the past couple of days. 7/10 R knee pain while walking from waiting room to treatment room. No back pain currently.    SUBJECTIVE STATEMENT:   PAIN:  Are you having pain? See subjective      TODAY'S TREATMENT:                                                                                                                                         DATE: 04/18/2022    [place] Allergies   Therapeutic exercise   Seated on elevated mat table                                Hip extension isometrics with knees bent                         R 10x5 seconds  for 3 sets  L 10x5 seconds for 3 sets    Clam shells (hips less than 90 degrees flexion) green band 5x2, then 3x   Difficult per pt.    Seated hip adduction folded pillow squeeze 10x5 seconds for 2 sets   Seated knee extension    L 5x5 seconds with PT assis tto end range    Then with strap assist to end range 5x5 seconds for 3 sets.   Seated B scapular retraction 10x5 seconds   To promote thoracic extension and decrease stress to low back.   Gait from treatment room to vehicle for safety  Cues for L hip circumduction, placing body weight onto L LE with B UE assist from rw to promote R knee flexion in neutral for swing phase, and neutral knee for foot flat and push-off to decrease R hip IR and R knee genu valgus, and therefore decreasing stress to R knee. Decreased R knee pain reported during gait.    Improved exercise technique, movement at target joints, use of target muscles after mod verbal, visual, tactile cues.    Response to treatment Pt tolerated session well without aggravation of symptoms. Decreased R knee pain reported after session.         Clinical impression  Good carry over of decreased back pain based on subjective reports. Pt however demonstrates  increased R knee pain secondary to increased R hip IR and R knee genu valgus during foot flat to push-off during gait secondary to her weak L LE with KAFO locked in extension. Worked on glute med, max, and L quadriceps strengthening to help address. Also worked on gait with cues for L hip circumduction, placing body weight onto L LE with B UE assist from rw to promote R knee flexion in neutral for swing phase, and neutral knee for foot flat and push-off to decrease R hip IR and R knee genu valgus, and therefore decreasing stress to R knee. Decreased R knee pain reported during gait. Pt will benefit from continued skilled physical therapy services to decrease pain, improve strength and function.        PATIENT EDUCATION: Education details: HEP, there-ex Person educated: Patient Education method: Explanation, Demonstration, Tactile cues, Verbal cues, and Handouts Education comprehension: verbalized understanding and returned demonstration  HOME EXERCISE PROGRAM: Access Code: [email or web address] URL: [email or web address] Date: 03/21/2022 Prepared by: Joneen Boers  Exercises - Seated Hip Abduction with Resistance  - 1 x daily - 7 x weekly - 2 sets - 10 reps  Red band  - Seated Hip Adduction Isometrics with Ball  - 1 x daily - 7 x weekly - 3 sets - 10 reps - 5 seconds hold  - Standing Hip Abduction with Counter Support  - 1 x daily - 7 x weekly - 2-3 sets - 10 reps  Reclined   Hooklying   Hip extension isometrics    L 10x5 seconds for 3 sets     R 10x5 seconds for 3 sets  - Supine Posterior Pelvic Tilt  - 1 x daily - 7 x weekly - 3 sets - 10 reps - 5 seconds hold - Seated Long Arc Quad with Strap  - 2 x daily - 4 x weekly - 4 sets - 5 reps - 5 seconds hold   PT Short Term Goals - 03/13/22 1815       PT SHORT TERM GOAL #1   Title Pt will be independent with her initial HEP to decrease R thigh, knee and back pain, improve B  LE strength and ability to ambulate with less difficulty.    Time 3    Period Weeks    Status New    Target Date 04/05/22              PT Long Term Goals - 03/21/22 1630       PT LONG TERM GOAL #1   Title Pt will have a decrease in R knee pain to 4/10 or less at worst to promote ability to ambulate with less difficulty.    Baseline R knee pain 9/10 at worst for the past 3 months (03/13/2022)    Time 8    Period Weeks    Status New    Target Date 05/10/22      PT LONG TERM GOAL #2   Title Pt will have a decrease in mid back pain to 4/10 or less at worst to promote ability to ambulate, perform standing tasks with less difficulty.    Baseline 10/10 mid back pain at worst for the past 3-4 days (03/13/2022)    Time 8    Period  Weeks    Status New    Target Date 05/10/22      PT LONG TERM GOAL #3   Title Pt will improve B hip flexion, extension, abduction, and knee extension strength by at least 1/2 MMT grade to promote ability to ambulate with less difficulty.    Baseline Hip flexion 4-/5 R, 3/5 L, hip extension 4-/5 R and L, hip abduction 4-/5 R, 3+/5 L, knee extension 4+/5 R, 3-/5 L (03/13/2022)    Time 8    Period Weeks    Status New    Target Date 05/10/22      PT LONG TERM GOAL #4   Title Pt will improve her FOTO score by at least 10 points as a demonstration of improved function.    Baseline Initial FOTO score not yet obtained. (03/13/2022); Upper leg FOTO 39 (03/21/2022)    Time 8    Period Weeks    Status New    Target Date 05/10/22              Plan - 04/18/22 1410     Clinical Impression Statement Good carry over of decreased back pain based on subjective reports. Pt however demonstrates  increased R knee pain secondary to increased R hip IR and R knee genu valgus during foot flat to push-off during gait secondary to her weak L LE with KAFO locked in extension. Worked on glute med, max, and L quadriceps strengthening to help address. Also worked on gait with cues for L hip circumduction, placing body weight onto L LE with B UE assist from rw to promote R knee flexion in neutral for swing phase, and neutral knee for foot flat and push-off to decrease R hip IR and R knee genu valgus, and therefore decreasing stress to R knee. Decreased R knee pain reported during gait. Pt will benefit from continued skilled physical therapy services to decrease pain, improve strength and function.    Personal Factors and Comorbidities Comorbidity 3+;Past/Current Experience;Time since onset of injury/illness/exacerbation;Fitness    Comorbidities Anxiety, arthritis, COPD, DM, depression, HTN    Examination-Activity Limitations Stairs;Lift;Caring for Others;Transfers;Carry;Locomotion Level;Squat    Stability/Clinical  Decision Making Stable/Uncomplicated    Clinical Decision Making Low    Rehab Potential Fair    PT Frequency 2x / week    PT Duration 8 weeks    PT Treatment/Interventions Electrical Stimulation;Iontophoresis '4mg'$ /ml Dexamethasone;Gait training;Stair  training;Functional mobility training;Therapeutic activities;Therapeutic exercise;Balance training;Neuromuscular re-education;Patient/family education;Manual techniques;Dry needling    PT Next Visit Plan FU on thoracic extension intervention and myofascial release to Rt periscapular zone    PT Home Exercise Plan no changes to program this visit; discussed trial of throacic towel roll if desired, but prefer to try this here first a few times before adding to home program    Consulted and Agree with Plan of Care Patient               Joneen Boers PT, DPT  04/18/2022, 6:28 PM

## 2022-04-26 ENCOUNTER — Ambulatory Visit: Payer: Medicare Other

## 2022-04-26 DIAGNOSIS — M5459 Other low back pain: Secondary | ICD-10-CM

## 2022-04-26 DIAGNOSIS — R262 Difficulty in walking, not elsewhere classified: Secondary | ICD-10-CM | POA: Diagnosis not present

## 2022-04-26 DIAGNOSIS — G8929 Other chronic pain: Secondary | ICD-10-CM

## 2022-04-26 DIAGNOSIS — M6281 Muscle weakness (generalized): Secondary | ICD-10-CM

## 2022-04-26 NOTE — Therapy (Signed)
OUTPATIENT PHYSICAL THERAPY TREATMENT NOTE   Patient Name: Traci Davis MRN: [id number] DOB:08-Nov-1970, 51 y.o., female Today's Date: 04/26/2022  PCP: Konrad Saha, MD  REFERRING PROVIDER: Leretha Dykes, DO    END OF SESSION:  PT End of Session - 04/26/22 1417     Visit Number 8    Number of Visits 17    Date for PT Re-Evaluation 05/10/22    Authorization Type UHC Medicare    Authorization Time Period 03/13/22-05/10/22    Progress Note Due on Visit 10    PT Start Time 7829    PT Stop Time 1445    PT Time Calculation (min) 28 min    Activity Tolerance Patient tolerated treatment well;No increased pain    Behavior During Therapy [facility] for tasks assessed/performed                  Past Medical History:  Diagnosis Date   Acute pancreatitis 09/04/2014   Anxiety    Arthritis    Asthma    Cerebral palsy (HCC)    Complication of anesthesia    panic attacks before surgery   COPD (chronic obstructive pulmonary disease) ([place])    Depression    Diabetes mellitus without complication ([place])    GERD (gastroesophageal reflux disease)    Hypertension    Noninfectious gastroenteritis and colitis 01/10/2013   Past Surgical History:  Procedure Laterality Date   CESAREAN SECTION  [place] N/A 02/20/2018   Procedure: DILATATION & CURETTAGE/HYSTEROSCOPY WITH MYOSURE ENDOMETRIAL POLYPECTOMY;  Surgeon: Will Bonnet, MD;  Location: [facility];  Service: Gynecology;  Laterality: N/A;   ESOPHAGOGASTRODUODENOSCOPY (EGD) WITH PROPOFOL N/A 01/01/2019   Procedure: ESOPHAGOGASTRODUODENOSCOPY (EGD) WITH PROPOFOL;  Surgeon: Lin Landsman, MD;  Location: [place];  Service: Gastroenterology;  Laterality: N/A;   FOOT CAPSULE RELEASE W/ PERCUTANEOUS HEEL CORD LENGTHENING, TIBIAL TENDON TRANSFER     age 67 ,and 2010-both legs   JOINT REPLACEMENT Right    both knees partial replacements dates  unknown   knee replacment     x 5 per pt. last one- left knee 2014. has had 2 on R 3 on L   TYMPANOPLASTY WITH GRAFT Right 01/08/2018   Procedure: TYMPANOPLASTY WITH POSSIBLE OSSICULAR CHAIN RECONSTRUCTION;  Surgeon: [name], MD;  Location: [facility];  Service: ENT;  Laterality: Right;   Patient Active Problem List   Diagnosis Date Noted   Medication reaction 08/29/2021   No-show for appointment 12/22/2020   Obesity due to excess calories 09/22/2020   Nausea 09/20/2020   Sinusitis 08/09/2020   Insomnia due to medical condition 05/11/2020   COPD (chronic obstructive pulmonary disease) ([place]) 03/22/2020   Hamstring tendonitis 01/29/2020   MDD (major depressive disorder), recurrent, in full remission ([place]) 08/12/2019   Seizure-like activity ([place]) 07/08/2019   Mild episode of recurrent major depressive disorder ([place]) 12/29/2018   Insomnia due to mental condition 12/29/2018   Disorder of vein 03/04/2018   Lumbar sprain 03/04/2018   Muscle weakness 03/04/2018   Neck sprain 03/04/2018   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   Postmenopausal bleeding 02/18/2018   Impingement syndrome of shoulder region 02/04/2018   Chest pain with moderate risk for cardiac etiology 05/19/2017   History of depression 04/15/2017   GAD (generalized anxiety disorder) 04/15/2017   Chronic daily headache 04/15/2017   Panic attack 04/15/2017   Nocturnal enuresis 04/15/2017   Mild cognitive impairment 04/15/2017   Loss of  memory 01/14/2017   Pain medication agreement signed 01/08/2017   Headache disorder 12/04/2016   Chronic, continuous use of opioids 07/01/2015   Vertigo 07/01/2015   Chronic midline low back pain without sciatica 03/21/2015   Acute renal failure (ARF) ([place]) 02/19/2015   Type 2 diabetes mellitus without complication ([place]) 87/56/4332   Cerebral palsy ([place]) 01/05/2014   Cerebral palsy with spastic/ataxic diplegia 01/05/2014   Hip pain, chronic 04/28/2013   Essential hypertension 01/10/2013    Irritable colon 01/10/2013   Encounter for long-term (current) use of other medications 12/04/2012   Chronic GERD 08/12/2012   Epigastric pain 08/12/2012   Diarrhea 08/12/2012   Primary localized osteoarthrosis, lower leg 05/07/2012   Difficulty walking 02/06/2012    REFERRING DIAG: G80.1 (ICD-10-CM) - 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M79.604 (ICD-10-CM) - Pain in right leg M79.605 (ICD-10-CM) - Pain in left leg   THERAPY DIAG:  Difficulty in walking, not elsewhere classified  Chronic pain of right knee  Muscle weakness (generalized)  Other low back pain  Rationale for Evaluation and Treatment Rehabilitation  PERTINENT HISTORY: Pain in R and L legs, spastic diplegic cerebral palsy. Hurt her R mid back, feels like she pulled a muscle, making it difficult for her to reach across. Feels like someone is grinding their knuckles on her R mid back, feels like she pulled a muscle. R knee and knee cap has been bothering her. L LE has been dragging worse than last time. Takes fluid pills for swelling in her legs. No L LE pain. Recently got a new KAFO and finally broke them in (was way too tight when pt first put it on. Both legs feel weak. Has a knee brace for her R knee cap to help keep it from collapsing. Got trigger point injections for her neck and back this month (November 2023). L LE feels weak.   PRECAUTIONS: fall risk  SUBJECTIVE: Having trouble with the inside of her knees (B medial knees). Has pain when she reaches to her L foot or when she tries to put on her diaper. 3-4/10 R low back pain and no R knee pain when walking from waiting room to treatment room. Has been having difficulty putting her shoes on for the past 2-3 years. 6-7/10 R knee pain, 5-8/10 mid back pain at worst for the past 7 days. Daughter brought her to PT.    SUBJECTIVE STATEMENT:   PAIN:  Are you having pain? See subjective      TODAY'S TREATMENT:                                                                                                                                          DATE: 04/26/2022  Latex Allergies  Therapeutic exercise  Seated on elevated mat table                Seated hip ER    R  10x5 seconds    L 10x5 seconds    Clam shells (hips less than 90 degrees flexion) green band 5x2, then 3x   Difficult per pt.    Seated hip adduction small green ball squeeze isometrics 10x5 seconds for 2 sets   Pt states feeling dizzy and light headed. Either going to pass out or throw up.    Blood pressure, L arm sitting, mechanically taken, normal cuff: 188/89, HR 77    Session stopped secondary to elevated blood pressure. Pt states taking blood pressure medicine and has been that high for the past month since going to her doctor in April 05, 2022. Pt was recommended to inform her doctor pertaining to her elevated levels. Pt verbalized understanding.  Pt states she has been feeling like throwing up that's why she is going to undergo a 5 hour test this Friday.     Blood pressure, L arm sitting, mechanically taken, normal cuff: 188/97, HR 76  Pt states that she does not feel like going to the ER. These things happen to her and feels like she needs to eat.    Improved exercise technique, movement at target joints, use of target muscles after mod verbal, visual, tactile cues.    Response to treatment No complain of increased knee or back pain during session.        Clinical impression  Continued working on hip strengthening to help decrease genu valgus when performing self care activities to help decrease medial stress and pain to bilateral knees. Session stopped early secondary to feeling of nausea and dizziness with elevated blood pressure. Pt was recommended to inform her MD about her elevated vitals. Pt verbalized understanding. Pt states that she does not feel like going to the ER. Pt will benefit from continued skilled physical therapy services to decrease pain, improve strength and  function.        PATIENT EDUCATION: Education details: HEP, there-ex Person educated: Patient Education method: Explanation, Demonstration, Tactile cues, Verbal cues, and Handouts Education comprehension: verbalized understanding and returned demonstration  HOME EXERCISE PROGRAM: Access Code: [email or web address] URL: [email or web address] Date: 03/21/2022 Prepared by: Joneen Boers  Exercises - Seated Hip Abduction with Resistance  - 1 x daily - 7 x weekly - 2 sets - 10 reps  Red band  - Seated Hip Adduction Isometrics with Ball  - 1 x daily - 7 x weekly - 3 sets - 10 reps - 5 seconds hold  - Standing Hip Abduction with Counter Support  - 1 x daily - 7 x weekly - 2-3 sets - 10 reps  Reclined   Hooklying   Hip extension isometrics    L 10x5 seconds for 3 sets     R 10x5 seconds for 3 sets  - Supine Posterior Pelvic Tilt  - 1 x daily - 7 x weekly - 3 sets - 10 reps - 5 seconds hold - Seated Long Arc Quad with Strap  - 2 x daily - 4 x weekly - 4 sets - 5 reps - 5 seconds hold   PT Short Term Goals - 03/13/22 1815       PT SHORT TERM GOAL #1   Title Pt will be independent with her initial HEP to decrease R thigh, knee and back pain, improve B LE strength and ability to ambulate with less difficulty.    Time 3    Period Weeks    Status New    Target Date 04/05/22  PT Long Term Goals - 03/21/22 1630       PT LONG TERM GOAL #1   Title Pt will have a decrease in R knee pain to 4/10 or less at worst to promote ability to ambulate with less difficulty.    Baseline R knee pain 9/10 at worst for the past 3 months (03/13/2022)    Time 8    Period Weeks    Status New    Target Date 05/10/22      PT LONG TERM GOAL #2   Title Pt will have a decrease in mid back pain to 4/10 or less at worst to promote ability to ambulate, perform standing tasks with less difficulty.    Baseline 10/10 mid back pain at worst for the past 3-4 days (03/13/2022)    Time 8     Period Weeks    Status New    Target Date 05/10/22      PT LONG TERM GOAL #3   Title Pt will improve B hip flexion, extension, abduction, and knee extension strength by at least 1/2 MMT grade to promote ability to ambulate with less difficulty.    Baseline Hip flexion 4-/5 R, 3/5 L, hip extension 4-/5 R and L, hip abduction 4-/5 R, 3+/5 L, knee extension 4+/5 R, 3-/5 L (03/13/2022)    Time 8    Period Weeks    Status New    Target Date 05/10/22      PT LONG TERM GOAL #4   Title Pt will improve her FOTO score by at least 10 points as a demonstration of improved function.    Baseline Initial FOTO score not yet obtained. (03/13/2022); Upper leg FOTO 39 (03/21/2022)    Time 8    Period Weeks    Status New    Target Date 05/10/22              Plan - 04/26/22 1417     Clinical Impression Statement Continued working on hip strengthening to help decrease genu valgus when performing self care activities to help decrease medial stress and pain to bilateral knees. Session stopped early secondary to feeling of nausea and dizziness with elevated blood pressure. Pt was recommended to inform her MD about her elevated vitals. Pt verbalized understanding. Pt states that she does not feel like going to the ER. Pt will benefit from continued skilled physical therapy services to decrease pain, improve strength and function.    Personal Factors and Comorbidities Comorbidity 3+;Past/Current Experience;Time since onset of injury/illness/exacerbation;Fitness    Comorbidities Anxiety, arthritis, COPD, DM, depression, HTN    Examination-Activity Limitations Stairs;Lift;Caring for Others;Transfers;Carry;Locomotion Level;Squat    Stability/Clinical Decision Making Stable/Uncomplicated    Rehab Potential Fair    PT Frequency 2x / week    PT Duration 8 weeks    PT Treatment/Interventions Electrical Stimulation;Iontophoresis '4mg'$ /ml Dexamethasone;Gait training;Stair training;Functional mobility  training;Therapeutic activities;Therapeutic exercise;Balance training;Neuromuscular re-education;Patient/family education;Manual techniques;Dry needling    PT Next Visit Plan FU on thoracic extension intervention and myofascial release to Rt periscapular zone    PT Home Exercise Plan no changes to program this visit; discussed trial of throacic towel roll if desired, but prefer to try this here first a few times before adding to home program    Consulted and Agree with Plan of Care Patient                Joneen Boers PT, DPT  04/26/2022, 2:57 PM

## 2022-04-27 ENCOUNTER — Encounter
Admission: RE | Admit: 2022-04-27 | Discharge: 2022-04-27 | Disposition: A | Discharge status: Home / Self Care | Payer: Medicare Other | Source: Ambulatory Visit | Attending: Gastroenterology | Admitting: Gastroenterology

## 2022-04-27 DIAGNOSIS — R112 Nausea with vomiting, unspecified: Secondary | ICD-10-CM | POA: Insufficient documentation

## 2022-04-27 MED ORDER — TECHNETIUM TC 99M SULFUR COLLOID
2.0000 | Freq: Once | INTRAVENOUS | Status: AC | PRN
  Administered 2022-04-27: 2.52 via ORAL

## 2022-05-01 ENCOUNTER — Telehealth: Payer: Self-pay

## 2022-05-01 NOTE — Telephone Encounter (Signed)
Patient is calling wanting to know the results of her gastric emptying study. Please advise

## 2022-05-02 ENCOUNTER — Ambulatory Visit: Payer: 59 | Attending: Physician Assistant

## 2022-05-02 DIAGNOSIS — M25561 Pain in right knee: Secondary | ICD-10-CM | POA: Diagnosis present

## 2022-05-02 DIAGNOSIS — R262 Difficulty in walking, not elsewhere classified: Secondary | ICD-10-CM | POA: Diagnosis present

## 2022-05-02 DIAGNOSIS — M6281 Muscle weakness (generalized): Secondary | ICD-10-CM | POA: Insufficient documentation

## 2022-05-02 DIAGNOSIS — M5459 Other low back pain: Secondary | ICD-10-CM | POA: Diagnosis present

## 2022-05-02 DIAGNOSIS — G8929 Other chronic pain: Secondary | ICD-10-CM | POA: Diagnosis present

## 2022-05-02 DIAGNOSIS — G801 Spastic diplegic cerebral palsy: Secondary | ICD-10-CM | POA: Diagnosis present

## 2022-05-02 NOTE — Therapy (Signed)
OUTPATIENT PHYSICAL THERAPY TREATMENT NOTE   Patient Name: Traci Davis MRN: [id number] DOB:1970-11-09, 52 y.o., female Today's Date: 05/02/2022  PCP: Konrad Saha, MD  REFERRING PROVIDER: Leretha Dykes, DO    END OF SESSION:  PT End of Session - 05/02/22 1503     Visit Number 9    Number of Visits 17    Date for PT Re-Evaluation 05/10/22    Authorization Type UHC Medicare    Authorization Time Period 03/13/22-05/10/22    Progress Note Due on Visit 10    PT Start Time 1503    PT Stop Time 1543    PT Time Calculation (min) 40 min    Activity Tolerance Patient tolerated treatment well;No increased pain    Behavior During Therapy [facility] for tasks assessed/performed                   Past Medical History:  Diagnosis Date   Acute pancreatitis 09/04/2014   Anxiety    Arthritis    Asthma    Cerebral palsy (HCC)    Complication of anesthesia    panic attacks before surgery   COPD (chronic obstructive pulmonary disease) ([place])    Depression    Diabetes mellitus without complication ([place])    GERD (gastroesophageal reflux disease)    Hypertension    Noninfectious gastroenteritis and colitis 01/10/2013   Past Surgical History:  Procedure Laterality Date   CESAREAN SECTION  [place] N/A 02/20/2018   Procedure: DILATATION & CURETTAGE/HYSTEROSCOPY WITH MYOSURE ENDOMETRIAL POLYPECTOMY;  Surgeon: Will Bonnet, MD;  Location: [facility];  Service: Gynecology;  Laterality: N/A;   ESOPHAGOGASTRODUODENOSCOPY (EGD) WITH PROPOFOL N/A 01/01/2019   Procedure: ESOPHAGOGASTRODUODENOSCOPY (EGD) WITH PROPOFOL;  Surgeon: Lin Landsman, MD;  Location: [place];  Service: Gastroenterology;  Laterality: N/A;   FOOT CAPSULE RELEASE W/ PERCUTANEOUS HEEL CORD LENGTHENING, TIBIAL TENDON TRANSFER     age 34 ,and 2010-both legs   JOINT REPLACEMENT Right    both knees partial replacements dates  unknown   knee replacment     x 5 per pt. last one- left knee 2014. has had 2 on R 3 on L   TYMPANOPLASTY WITH GRAFT Right 01/08/2018   Procedure: TYMPANOPLASTY WITH POSSIBLE OSSICULAR CHAIN RECONSTRUCTION;  Surgeon: [name], MD;  Location: [facility];  Service: ENT;  Laterality: Right;   Patient Active Problem List   Diagnosis Date Noted   Medication reaction 08/29/2021   No-show for appointment 12/22/2020   Obesity due to excess calories 09/22/2020   Nausea 09/20/2020   Sinusitis 08/09/2020   Insomnia due to medical condition 05/11/2020   COPD (chronic obstructive pulmonary disease) ([place]) 03/22/2020   Hamstring tendonitis 01/29/2020   MDD (major depressive disorder), recurrent, in full remission ([place]) 08/12/2019   Seizure-like activity ([place]) 07/08/2019   Mild episode of recurrent major depressive disorder ([place]) 12/29/2018   Insomnia due to mental condition 12/29/2018   Disorder of vein 03/04/2018   Lumbar sprain 03/04/2018   Muscle weakness 03/04/2018   Neck sprain 03/04/2018   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   Postmenopausal bleeding 02/18/2018   Impingement syndrome of shoulder region 02/04/2018   Chest pain with moderate risk for cardiac etiology 05/19/2017   History of depression 04/15/2017   GAD (generalized anxiety disorder) 04/15/2017   Chronic daily headache 04/15/2017   Panic attack 04/15/2017   Nocturnal enuresis 04/15/2017   Mild cognitive impairment 04/15/2017   Loss  of memory 01/14/2017   Pain medication agreement signed 01/08/2017   Headache disorder 12/04/2016   Chronic, continuous use of opioids 07/01/2015   Vertigo 07/01/2015   Chronic midline low back pain without sciatica 03/21/2015   Acute renal failure (ARF) ([place]) 02/19/2015   Type 2 diabetes mellitus without complication ([place]) 47/65/4650   Cerebral palsy ([place]) 01/05/2014   Cerebral palsy with spastic/ataxic diplegia 01/05/2014   Hip pain, chronic 04/28/2013   Essential hypertension 01/10/2013    Irritable colon 01/10/2013   Encounter for long-term (current) use of other medications 12/04/2012   Chronic GERD 08/12/2012   Epigastric pain 08/12/2012   Diarrhea 08/12/2012   Primary localized osteoarthrosis, lower leg 05/07/2012   Difficulty walking 02/06/2012    REFERRING DIAG: G80.1 (ICD-10-CM) - 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M79.604 (ICD-10-CM) - Pain in right leg M79.605 (ICD-10-CM) - Pain in left leg   THERAPY DIAG:  Difficulty in walking, not elsewhere classified  Chronic pain of right knee  Muscle weakness (generalized)  Other low back pain  Rationale for Evaluation and Treatment Rehabilitation  PERTINENT HISTORY: Pain in R and L legs, spastic diplegic cerebral palsy. Hurt her R mid back, feels like she pulled a muscle, making it difficult for her to reach across. Feels like someone is grinding their knuckles on her R mid back, feels like she pulled a muscle. R knee and knee cap has been bothering her. L LE has been dragging worse than last time. Takes fluid pills for swelling in her legs. No L LE pain. Recently got a new KAFO and finally broke them in (was way too tight when pt first put it on. Both legs feel weak. Has a knee brace for her R knee cap to help keep it from collapsing. Got trigger point injections for her neck and back this month (November 2023). L LE feels weak.   PRECAUTIONS: fall risk  SUBJECTIVE: R knee is good, no pain currently. L knee is alright. Mid back is not hurting as bad today, 5/10 currently. Took pain medicine too. No feeling of dizziness, passing out or throwing up.     SUBJECTIVE STATEMENT:   PAIN:  Are you having pain? See subjective      TODAY'S TREATMENT:                                                                                                                                         DATE: 05/02/2022  Latex Allergies  Therapeutic exercise  Seated on elevated mat table                Seated hip ER    R 10x5 seconds for 2  sets   L 10x5 seconds for 2 sets    Clam shells (hips less than 90 degrees flexion) green band 5x3    Seated hip adduction small green ball squeeze isometrics 10x5 seconds for 2 sets  Blood pressure, L arm sitting, mechanically taken, normal cuff: 142/76, HR 84   Seated B scapular retraction 10x5 seconds for 3 sets                 To promote thoracic extension and decrease stress to low back.    Gait from treatment room to vehicle for safety                Cues for L hip circumduction, placing body weight onto L LE with B UE assist from rw to promote R knee flexion in neutral for swing phase, and neutral knee for foot flat and push-off to decrease R hip IR and R knee genu valgus, and therefore decreasing stress to R knee.   Improved exercise technique, movement at target joints, use of target muscles after mod verbal, visual, tactile cues.    Response to treatment No complain of increased knee or back pain during session.        Clinical impression  Improving ability to ambulate with less R knee genu valgus, hip IR and hip adduction observed overall when pt is cued. Continued working on hip strengthening to decrease B knee genu valgus. Worked on seated B scapular retraction to promote thoracic extension and decrease stress to her back. No complain of back and knee pain throughout session. Pt will benefit from continued skilled physical therapy services to decrease pain, improve strength and function.        PATIENT EDUCATION: Education details: HEP, there-ex Person educated: Patient Education method: Explanation, Demonstration, Tactile cues, Verbal cues, and Handouts Education comprehension: verbalized understanding and returned demonstration  HOME EXERCISE PROGRAM: Access Code: [email or web address] URL: [email or web address] Date: 03/21/2022 Prepared by: Joneen Boers  Exercises - Seated Hip Abduction with Resistance  - 1 x daily - 7 x weekly - 2 sets - 10  reps  Red band  - Seated Hip Adduction Isometrics with Ball  - 1 x daily - 7 x weekly - 3 sets - 10 reps - 5 seconds hold  - Standing Hip Abduction with Counter Support  - 1 x daily - 7 x weekly - 2-3 sets - 10 reps  Reclined   Hooklying   Hip extension isometrics    L 10x5 seconds for 3 sets     R 10x5 seconds for 3 sets  - Supine Posterior Pelvic Tilt  - 1 x daily - 7 x weekly - 3 sets - 10 reps - 5 seconds hold - Seated Long Arc Quad with Strap  - 2 x daily - 4 x weekly - 4 sets - 5 reps - 5 seconds hold   PT Short Term Goals - 03/13/22 1815       PT SHORT TERM GOAL #1   Title Pt will be independent with her initial HEP to decrease R thigh, knee and back pain, improve B LE strength and ability to ambulate with less difficulty.    Time 3    Period Weeks    Status New    Target Date 04/05/22              PT Long Term Goals - 03/21/22 1630       PT LONG TERM GOAL #1   Title Pt will have a decrease in R knee pain to 4/10 or less at worst to promote ability to ambulate with less difficulty.    Baseline R knee pain 9/10 at worst for the past 3 months (03/13/2022)    Time 8  Period Weeks    Status New    Target Date 05/10/22      PT LONG TERM GOAL #2   Title Pt will have a decrease in mid back pain to 4/10 or less at worst to promote ability to ambulate, perform standing tasks with less difficulty.    Baseline 10/10 mid back pain at worst for the past 3-4 days (03/13/2022)    Time 8    Period Weeks    Status New    Target Date 05/10/22      PT LONG TERM GOAL #3   Title Pt will improve B hip flexion, extension, abduction, and knee extension strength by at least 1/2 MMT grade to promote ability to ambulate with less difficulty.    Baseline Hip flexion 4-/5 R, 3/5 L, hip extension 4-/5 R and L, hip abduction 4-/5 R, 3+/5 L, knee extension 4+/5 R, 3-/5 L (03/13/2022)    Time 8    Period Weeks    Status New    Target Date 05/10/22      PT LONG TERM GOAL #4    Title Pt will improve her FOTO score by at least 10 points as a demonstration of improved function.    Baseline Initial FOTO score not yet obtained. (03/13/2022); Upper leg FOTO 39 (03/21/2022)    Time 8    Period Weeks    Status New    Target Date 05/10/22              Plan - 05/02/22 1502     Clinical Impression Statement Improving ability to ambulate with less R knee genu valgus, hip IR and hip adduction observed overall when pt is cued. Continued working on hip strengthening to decrease B knee genu valgus. Worked on seated B scapular retraction to promote thoracic extension and decrease stress to her back. No complain of back and knee pain throughout session. Pt will benefit from continued skilled physical therapy services to decrease pain, improve strength and function.    Personal Factors and Comorbidities Comorbidity 3+;Past/Current Experience;Time since onset of injury/illness/exacerbation;Fitness    Comorbidities Anxiety, arthritis, COPD, DM, depression, HTN    Examination-Activity Limitations Stairs;Lift;Caring for Others;Transfers;Carry;Locomotion Level;Squat    Stability/Clinical Decision Making Stable/Uncomplicated    Rehab Potential Fair    PT Frequency 2x / week    PT Duration 8 weeks    PT Treatment/Interventions Electrical Stimulation;Iontophoresis '4mg'$ /ml Dexamethasone;Gait training;Stair training;Functional mobility training;Therapeutic activities;Therapeutic exercise;Balance training;Neuromuscular re-education;Patient/family education;Manual techniques;Dry needling    PT Next Visit Plan FU on thoracic extension intervention and myofascial release to Rt periscapular zone    PT Home Exercise Plan no changes to program this visit; discussed trial of throacic towel roll if desired, but prefer to try this here first a few times before adding to home program    Consulted and Agree with Plan of Care Patient                 Joneen Boers PT, DPT  05/02/2022, 3:56  PM

## 2022-05-03 ENCOUNTER — Encounter: Payer: Self-pay | Admitting: Gastroenterology

## 2022-05-03 NOTE — Telephone Encounter (Signed)
Called patient and left a detail message informing her results were on mychart

## 2022-05-07 MED ORDER — METOCLOPRAMIDE HCL 5 MG PO TABS: 5.0000 mg | ORAL_TABLET | Freq: Three times a day (TID) | ORAL | 0 refills | Status: DC

## 2022-05-07 NOTE — Telephone Encounter (Signed)
Recommend trial of metoclopramide 5 mg before each meal and at bedtime for 2 weeks  RV

## 2022-05-07 NOTE — Telephone Encounter (Signed)
Patient verbalized understanding. She states to call it in to [facility]. Sent medication to the pharmacy

## 2022-05-07 NOTE — Addendum Note (Signed)
Addended by: Ulyess Blossom L on: 05/07/2022 01:12 PM   Modules accepted: Orders

## 2022-05-07 NOTE — Telephone Encounter (Signed)
Patient called back and verbalized understanding of results that was sent to her mychart account. Patient states she has been doing what you recommended since office visit and the symptoms have not got better. She states she is nausea constantly and wants to know what to do.

## 2022-05-10 ENCOUNTER — Ambulatory Visit: Payer: 59

## 2022-05-10 ENCOUNTER — Telehealth: Payer: Self-pay

## 2022-05-10 NOTE — Telephone Encounter (Signed)
No show. Called patient who said that she has been very sick since Friday. Had a real bad Virus. Today's the first day she was able to eat and keep anything down. Will try to make it to her next scheduled appointments. Will call us the day before if she cannot make it.

## 2022-05-15 ENCOUNTER — Ambulatory Visit: Payer: Medicare Other | Admitting: Licensed Clinical Social Worker

## 2022-05-16 ENCOUNTER — Ambulatory Visit: Payer: 59

## 2022-05-16 DIAGNOSIS — R262 Difficulty in walking, not elsewhere classified: Secondary | ICD-10-CM | POA: Diagnosis not present

## 2022-05-16 DIAGNOSIS — G8929 Other chronic pain: Secondary | ICD-10-CM

## 2022-05-16 DIAGNOSIS — G801 Spastic diplegic cerebral palsy: Secondary | ICD-10-CM

## 2022-05-16 DIAGNOSIS — M5459 Other low back pain: Secondary | ICD-10-CM

## 2022-05-16 DIAGNOSIS — M6281 Muscle weakness (generalized): Secondary | ICD-10-CM

## 2022-05-16 NOTE — Therapy (Signed)
OUTPATIENT PHYSICAL THERAPY TREATMENT NOTE And Progress Report (03/13/2022 - 05/16/2022)   Patient Name: Traci Davis MRN: [id number] DOB:09-Apr-1971, 52 y.o., female Today's Date: 05/16/2022  PCP: Konrad Saha, MD  REFERRING PROVIDER: Leretha Dykes, DO    END OF SESSION:  PT End of Session - 05/16/22 1504     Visit Number 10    Number of Visits 29    Date for PT Re-Evaluation 06/28/22    Authorization Type [employer] Medicare    Authorization Time Period 03/13/22-05/10/22    Progress Note Due on Visit 10    PT Start Time 1504    PT Stop Time 1547    PT Time Calculation (min) 43 min    Activity Tolerance Patient tolerated treatment well;No increased pain    Behavior During Therapy [facility] for tasks assessed/performed                    Past Medical History:  Diagnosis Date   Acute pancreatitis 09/04/2014   Anxiety    Arthritis    Asthma    Cerebral palsy (HCC)    Complication of anesthesia    panic attacks before surgery   COPD (chronic obstructive pulmonary disease) ([place])    Depression    Diabetes mellitus without complication ([place])    GERD (gastroesophageal reflux disease)    Hypertension    Noninfectious gastroenteritis and colitis 01/10/2013   Past Surgical History:  Procedure Laterality Date   CESAREAN SECTION  [place] N/A 02/20/2018   Procedure: DILATATION & CURETTAGE/HYSTEROSCOPY WITH MYOSURE ENDOMETRIAL POLYPECTOMY;  Surgeon: Will Bonnet, MD;  Location: [facility];  Service: Gynecology;  Laterality: N/A;   ESOPHAGOGASTRODUODENOSCOPY (EGD) WITH PROPOFOL N/A 01/01/2019   Procedure: ESOPHAGOGASTRODUODENOSCOPY (EGD) WITH PROPOFOL;  Surgeon: Lin Landsman, MD;  Location: [facility];  Service: Gastroenterology;  Laterality: N/A;   FOOT CAPSULE RELEASE W/ PERCUTANEOUS HEEL CORD LENGTHENING, TIBIAL TENDON TRANSFER     age 54 ,and 2010-both legs   JOINT REPLACEMENT  Right    both knees partial replacements dates unknown   knee replacment     x 5 per pt. last one- left knee 2014. has had 2 on R 3 on L   TYMPANOPLASTY WITH GRAFT Right 01/08/2018   Procedure: TYMPANOPLASTY WITH POSSIBLE OSSICULAR CHAIN RECONSTRUCTION;  Surgeon: [name], MD;  Location: [facility];  Service: ENT;  Laterality: Right;   Patient Active Problem List   Diagnosis Date Noted   Medication reaction 08/29/2021   No-show for appointment 12/22/2020   Obesity due to excess calories 09/22/2020   Nausea 09/20/2020   Sinusitis 08/09/2020   Insomnia due to medical condition 05/11/2020   COPD (chronic obstructive pulmonary disease) ([place]) 03/22/2020   Hamstring tendonitis 01/29/2020   MDD (major depressive disorder), recurrent, in full remission ([place]) 08/12/2019   Seizure-like activity ([place]) 07/08/2019   Mild episode of recurrent major depressive disorder ([place]) 12/29/2018   Insomnia due to mental condition 12/29/2018   Disorder of vein 03/04/2018   Lumbar sprain 03/04/2018   Muscle weakness 03/04/2018   Neck sprain 03/04/2018   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   Postmenopausal bleeding 02/18/2018   Impingement syndrome of shoulder region 02/04/2018   Chest pain with moderate risk for cardiac etiology 05/19/2017   History of depression 04/15/2017   GAD (generalized anxiety disorder) 04/15/2017   Chronic daily headache 04/15/2017   Panic attack 04/15/2017   Nocturnal enuresis 04/15/2017  Mild cognitive impairment 04/15/2017   Loss of memory 01/14/2017   Pain medication agreement signed 01/08/2017   Headache disorder 12/04/2016   Chronic, continuous use of opioids 07/01/2015   Vertigo 07/01/2015   Chronic midline low back pain without sciatica 03/21/2015   Acute renal failure (ARF) ([place]) 02/19/2015   Type 2 diabetes mellitus without complication ([place]) 12/87/8676   Cerebral palsy ([place]) 01/05/2014   Cerebral palsy with spastic/ataxic diplegia 01/05/2014   Hip pain, chronic  04/28/2013   Essential hypertension 01/10/2013   Irritable colon 01/10/2013   Encounter for long-term (current) use of other medications 12/04/2012   Chronic GERD 08/12/2012   Epigastric pain 08/12/2012   Diarrhea 08/12/2012   Primary localized osteoarthrosis, lower leg 05/07/2012   Difficulty walking 02/06/2012    REFERRING DIAG: G80.1 (ICD-10-CM) - 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M79.604 (ICD-10-CM) - Pain in right leg M79.605 (ICD-10-CM) - Pain in left leg   THERAPY DIAG:  Difficulty in walking, not elsewhere classified - Plan: PT plan of care cert/re-cert  Chronic pain of right knee - Plan: PT plan of care cert/re-cert  Muscle weakness (generalized) - Plan: PT plan of care cert/re-cert  Other low back pain - Plan: PT plan of care cert/re-cert  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([place]) - Plan: PT plan of care cert/re-cert  Rationale for Evaluation and Treatment Rehabilitation  PERTINENT HISTORY: Pain in R and L legs, spastic diplegic cerebral palsy. Hurt her R mid back, feels like she pulled a muscle, making it difficult for her to reach across. Feels like someone is grinding their knuckles on her R mid back, feels like she pulled a muscle. R knee and knee cap has been bothering her. L LE has been dragging worse than last time. Takes fluid pills for swelling in her legs. No L LE pain. Recently got a new KAFO and finally broke them in (was way too tight when pt first put it on. Both legs feel weak. Has a knee brace for her R knee cap to help keep it from collapsing. Got trigger point injections for her neck and back this month (November 2023). L LE feels weak.   PRECAUTIONS: fall risk  SUBJECTIVE: Has not been able to do her HEP due to being sick.  Feels better, just has a runny nose at the moment. Currently wearing a heating pad for her back which helps. No R or L  knee pain currently. 6/10 R low back (R lumbar paraspinal muscle tension). The shoulder blade squeezes help with her back  pain. No questions with her HEP. PT helps. Pt states Legs feel better than before.     SUBJECTIVE STATEMENT:   PAIN:  Are you having pain? See subjective      TODAY'S TREATMENT:  DATE: 05/16/2022  Latex Allergies  Therapeutic exercise  R lumbar paraspinal muscle tension palpated.   Seated manually resisted hip flexion, extension, abduction, knee extension  Reviewed progress/current status with PT towards goals  Reviewed POC: continue another 2x/week for 6 weeks secondary to her current condition.   Seated trunk flexion stretch 10x10 seconds   Gait from treatment room to vehicle for safety                Cues for L hip circumduction, placing body weight onto L LE with B UE assist from rw to promote R knee flexion in neutral for swing phase, and neutral knee for foot flat and push-off to decrease R hip IR and R knee genu valgus, and therefore decreasing stress to R knee.   Improved exercise technique, movement at target joints, use of target muscles after mod verbal, visual, tactile cues.      Manual therapy:  Seated STM to lumbar paraspinal muscles to decrease tension   Decreased back pain reported afterwards.      Response to treatment Decreased back pain reported after session.        Clinical impression  Pt demonstrates a decrease in worst R knee pain by 2 points, mid back pain by 2 points, improved bilateral hip extension strength since initial evaluation. Pt also states B LE feel better than before starting PT. FOTO score however, does not reflect pt subjective reports. Challenges to progress include chronicity of condition, and cerebral palsy. Pt will benefit from continued skilled physical therapy services to decrease pain, improve strength and function.        PATIENT EDUCATION: Education details: HEP,  there-ex Person educated: Patient Education method: Explanation, Demonstration, Tactile cues, Verbal cues, and Handouts Education comprehension: verbalized understanding and returned demonstration  HOME EXERCISE PROGRAM: Access Code: [email or web address] URL: [email or web address] Date: 03/21/2022 Prepared by: Joneen Boers  Exercises - Seated Hip Abduction with Resistance  - 1 x daily - 7 x weekly - 2 sets - 10 reps  Red band  - Seated Hip Adduction Isometrics with Ball  - 1 x daily - 7 x weekly - 3 sets - 10 reps - 5 seconds hold  - Standing Hip Abduction with Counter Support  - 1 x daily - 7 x weekly - 2-3 sets - 10 reps  Reclined   Hooklying   Hip extension isometrics    L 10x5 seconds for 3 sets     R 10x5 seconds for 3 sets  - Supine Posterior Pelvic Tilt  - 1 x daily - 7 x weekly - 3 sets - 10 reps - 5 seconds hold - Seated Long Arc Quad with Strap  - 2 x daily - 4 x weekly - 4 sets - 5 reps - 5 seconds hold   PT Short Term Goals - 05/16/22 1742       PT SHORT TERM GOAL #1   Title Pt will be independent with her initial HEP to decrease R thigh, knee and back pain, improve B LE strength and ability to ambulate with less difficulty.    Baseline No questions with her initial HEP (05/16/2022)    Time 3    Period Weeks    Status Achieved    Target Date 04/05/22              PT Long Term Goals - 05/16/22 1510       PT LONG TERM GOAL #1   Title Pt will have a decrease in R  knee pain to 4/10 or less at worst to promote ability to ambulate with less difficulty.    Baseline R knee pain 9/10 at worst for the past 3 months (03/13/2022); R knee overall feels better compared to prior to starting PT, 7/10 R lateral femoral condyle at most for the past 7 days (05/16/2022)    Time 6    Period Weeks    Status Partially Met    Target Date 06/28/22      PT LONG TERM GOAL #2   Title Pt will have a decrease in mid back pain to 4/10 or less at worst to promote ability to  ambulate, perform standing tasks with less difficulty.    Baseline 10/10 mid back pain at worst for the past 3-4 days (03/13/2022); 8/10 at worst for the past 2 weeks, the exercises help (05/16/2022)    Time 6    Period Weeks    Status Partially Met    Target Date 06/28/22      PT LONG TERM GOAL #3   Title Pt will improve B hip flexion, extension, abduction, and knee extension strength by at least 1/2 MMT grade to promote ability to ambulate with less difficulty.    Baseline Hip flexion 4-/5 R, 3/5 L, hip extension 4-/5 R and L, hip abduction 4-/5 R, 3+/5 L, knee extension 4+/5 R, 3-/5 L (03/13/2022); seated manually resisted: hip flexion 4-/5 R, 4-/5 L, hip extension 4/5 R and L, hip abduction 4-/5 R, 3+/5 L, knee extension 4+/5 R, 3-/5 L (05/16/2022)    Time 6    Period Weeks    Status Partially Met    Target Date 06/28/22      PT LONG TERM GOAL #4   Title Pt will improve her FOTO score by at least 10 points as a demonstration of improved function.    Baseline Initial FOTO score not yet obtained. (03/13/2022); Upper leg FOTO 39 (03/21/2022); Upper leg FOTO 35. Pt however states legs feel better since starting PT (05/16/2022)    Time 6    Period Weeks    Status On-going    Target Date 06/28/22              Plan - 05/16/22 1503     Clinical Impression Statement Pt demonstrates a decrease in worst R knee pain by 2 points, mid back pain by 2 points, improved bilateral hip extension strength since initial evaluation. Pt also states B LE feel better than before starting PT. FOTO score however, does not reflect pt subjective reports. Challenges to progress include chronicity of condition, and cerebral palsy. Pt will benefit from continued skilled physical therapy services to decrease pain, improve strength and function.    Personal Factors and Comorbidities Comorbidity 3+;Past/Current Experience;Time since onset of injury/illness/exacerbation;Fitness    Comorbidities Anxiety, arthritis,  COPD, DM, depression, HTN    Examination-Activity Limitations Stairs;Lift;Caring for Others;Transfers;Carry;Locomotion Level;Squat    Stability/Clinical Decision Making Stable/Uncomplicated    Clinical Decision Making Low    Rehab Potential Fair    PT Frequency 2x / week    PT Duration 8 weeks    PT Treatment/Interventions Electrical Stimulation;Iontophoresis '4mg'$ /ml Dexamethasone;Gait training;Stair training;Functional mobility training;Therapeutic activities;Therapeutic exercise;Balance training;Neuromuscular re-education;Patient/family education;Manual techniques;Dry needling    PT Next Visit Plan FU on thoracic extension intervention and myofascial release to Rt periscapular zone    PT Home Exercise Plan no changes to program this visit; discussed trial of throacic towel roll if desired, but prefer to try this here first a  few times before adding to home program    Consulted and Agree with Plan of Care Patient               Thank you for your referral.   Joneen Boers PT, DPT  05/16/2022, 5:53 PM

## 2022-05-23 ENCOUNTER — Ambulatory Visit: Payer: 59

## 2022-05-23 DIAGNOSIS — R262 Difficulty in walking, not elsewhere classified: Secondary | ICD-10-CM

## 2022-05-23 DIAGNOSIS — G801 Spastic diplegic cerebral palsy: Secondary | ICD-10-CM

## 2022-05-23 DIAGNOSIS — G8929 Other chronic pain: Secondary | ICD-10-CM

## 2022-05-23 DIAGNOSIS — M6281 Muscle weakness (generalized): Secondary | ICD-10-CM

## 2022-05-23 DIAGNOSIS — M5459 Other low back pain: Secondary | ICD-10-CM

## 2022-05-23 NOTE — Therapy (Signed)
OUTPATIENT PHYSICAL THERAPY TREATMENT NOTE    Patient Name: Traci Davis MRN: [id number] DOB:Aug 19, 1970, 52 y.o., female Today's Date: 05/23/2022  PCP: Konrad Saha, MD  REFERRING PROVIDER: Leretha Dykes, DO    END OF SESSION:  PT End of Session - 05/23/22 1359     Visit Number 11    Number of Visits 29    Date for PT Re-Evaluation 06/28/22    Authorization Type [employer] Medicare    Authorization Time Period 03/13/22-05/10/22    Progress Note Due on Visit 10    PT Start Time 1400    PT Stop Time 1446    PT Time Calculation (min) 46 min    Activity Tolerance Patient tolerated treatment well;No increased pain    Behavior During Therapy [facility] for tasks assessed/performed                     Past Medical History:  Diagnosis Date   Acute pancreatitis 09/04/2014   Anxiety    Arthritis    Asthma    Cerebral palsy (HCC)    Complication of anesthesia    panic attacks before surgery   COPD (chronic obstructive pulmonary disease) ([place])    Depression    Diabetes mellitus without complication ([place])    GERD (gastroesophageal reflux disease)    Hypertension    Noninfectious gastroenteritis and colitis 01/10/2013   Past Surgical History:  Procedure Laterality Date   CESAREAN SECTION  [place] N/A 02/20/2018   Procedure: DILATATION & CURETTAGE/HYSTEROSCOPY WITH MYOSURE ENDOMETRIAL POLYPECTOMY;  Surgeon: Will Bonnet, MD;  Location: [facility];  Service: Gynecology;  Laterality: N/A;   ESOPHAGOGASTRODUODENOSCOPY (EGD) WITH PROPOFOL N/A 01/01/2019   Procedure: ESOPHAGOGASTRODUODENOSCOPY (EGD) WITH PROPOFOL;  Surgeon: Lin Landsman, MD;  Location: [place];  Service: Gastroenterology;  Laterality: N/A;   FOOT CAPSULE RELEASE W/ PERCUTANEOUS HEEL CORD LENGTHENING, TIBIAL TENDON TRANSFER     age 53 ,and 2010-both legs   JOINT REPLACEMENT Right    both knees partial replacements  dates unknown   knee replacment     x 5 per pt. last one- left knee 2014. has had 2 on R 3 on L   TYMPANOPLASTY WITH GRAFT Right 01/08/2018   Procedure: TYMPANOPLASTY WITH POSSIBLE OSSICULAR CHAIN RECONSTRUCTION;  Surgeon: [name], MD;  Location: [facility];  Service: ENT;  Laterality: Right;   Patient Active Problem List   Diagnosis Date Noted   Medication reaction 08/29/2021   No-show for appointment 12/22/2020   Obesity due to excess calories 09/22/2020   Nausea 09/20/2020   Sinusitis 08/09/2020   Insomnia due to medical condition 05/11/2020   COPD (chronic obstructive pulmonary disease) ([place]) 03/22/2020   Hamstring tendonitis 01/29/2020   MDD (major depressive disorder), recurrent, in full remission ([place]) 08/12/2019   Seizure-like activity ([place]) 07/08/2019   Mild episode of recurrent major depressive disorder ([place]) 12/29/2018   Insomnia due to mental condition 12/29/2018   Disorder of vein 03/04/2018   Lumbar sprain 03/04/2018   Muscle weakness 03/04/2018   Neck sprain 03/04/2018   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   Postmenopausal bleeding 02/18/2018   Impingement syndrome of shoulder region 02/04/2018   Chest pain with moderate risk for cardiac etiology 05/19/2017   History of depression 04/15/2017   GAD (generalized anxiety disorder) 04/15/2017   Chronic daily headache 04/15/2017   Panic attack 04/15/2017   Nocturnal enuresis 04/15/2017   Mild cognitive impairment 04/15/2017  Loss of memory 01/14/2017   Pain medication agreement signed 01/08/2017   Headache disorder 12/04/2016   Chronic, continuous use of opioids 07/01/2015   Vertigo 07/01/2015   Chronic midline low back pain without sciatica 03/21/2015   Acute renal failure (ARF) ([place]) 02/19/2015   Type 2 diabetes mellitus without complication ([place]) 41/93/7902   Cerebral palsy ([place]) 01/05/2014   Cerebral palsy with spastic/ataxic diplegia 01/05/2014   Hip pain, chronic 04/28/2013   Essential hypertension  01/10/2013   Irritable colon 01/10/2013   Encounter for long-term (current) use of other medications 12/04/2012   Chronic GERD 08/12/2012   Epigastric pain 08/12/2012   Diarrhea 08/12/2012   Primary localized osteoarthrosis, lower leg 05/07/2012   Difficulty walking 02/06/2012    REFERRING DIAG: G80.1 (ICD-10-CM) - 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M79.604 (ICD-10-CM) - Pain in right leg M79.605 (ICD-10-CM) - Pain in left leg   THERAPY DIAG:  Difficulty in walking, not elsewhere classified  Chronic pain of right knee  Muscle weakness (generalized)  Other low back pain  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(HCC)  Rationale for Evaluation and Treatment Rehabilitation  PERTINENT HISTORY: Pain in R and L legs, spastic diplegic cerebral palsy. Hurt her R mid back, feels like she pulled a muscle, making it difficult for her to reach across. Feels like someone is grinding their knuckles on her R mid back, feels like she pulled a muscle. R knee and knee cap has been bothering her. L LE has been dragging worse than last time. Takes fluid pills for swelling in her legs. No L LE pain. Recently got a new KAFO and finally broke them in (was way too tight when pt first put it on. Both legs feel weak. Has a knee brace for her R knee cap to help keep it from collapsing. Got trigger point injections for her neck and back this month (November 2023). L LE feels weak.   PRECAUTIONS: fall risk  SUBJECTIVE:     SUBJECTIVE STATEMENT: Just does don't feel good in general, feels sleepy and wore out. Driving her daughter to and from places is wearing her out. No back pain currently. Just took 2 oxycodones 30 minutes ago and has not kicked in yet. No R or L  knee pain while walking currently. Has been doing her HEP,   PAIN:  Are you having pain? See subjective      TODAY'S TREATMENT:                                                                                                                                          DATE: 05/23/2022  [place] Allergies  Therapeutic exercise  Sitting with upright posture   Gentle PT manual trunk perturbation all planes 1 minute x 3   Difficulty performing   B scapular retraction 10x3 with 5 second holds   Max tactile cues for proper scapular mechanics R > L  decreasing shoulder shrug   To promote thoracic extension to decrease stress to low back.    Seated trunk flexion stretch 10x10 seconds  Back feels better reported during this exercise.   Seated L hip flexion to promote L LE swing phase of gait with less use of R trunk muscles 10x2  Seated L knee extension with PT assist to end range 10x5 seconds for 2 sets   Sitting with upright posture   PT manually resisted trunk flexion isometrics in neutral 6x5 seconds, 5x5 seconds for 2 sets    Gait from treatment room to vehicle for safety                Cues for L hip circumduction, placing body weight onto L LE with B UE assist from rw to promote R knee flexion in neutral for swing phase, and neutral knee for foot flat and push-off to decrease R hip IR and R knee genu valgus, and therefore decreasing stress to R knee.    Improved exercise technique, movement at target joints, use of target muscles after mod verbal, visual, tactile cues.     Response to treatment Pt tolerated session well without aggravation of symptoms.        Clinical impression  Good carry over of decreased back pain from previous session based on subjective reports of no back pain currently. Continued working in improving L hip flexion and knee extension strength to promote ability to advance L LE forward during gait to decrease R trunk muscle overuse/compensation. Worked on trunk strength as well to decrease stress to low back during gait. Difficulty with trunk muscle use observed. Pt tolerated session well without aggravation of symptoms. Pt will benefit from continued skilled physical therapy services to decrease pain, improve  strength and function.        PATIENT EDUCATION: Education details: HEP, there-ex Person educated: Patient Education method: Explanation, Demonstration, Tactile cues, Verbal cues, and Handouts Education comprehension: verbalized understanding and returned demonstration  HOME EXERCISE PROGRAM: Access Code: [email or web address] URL: [email or web address] Date: 03/21/2022 Prepared by: Joneen Boers  Exercises - Seated Hip Abduction with Resistance  - 1 x daily - 7 x weekly - 2 sets - 10 reps  Red band  - Seated Hip Adduction Isometrics with Ball  - 1 x daily - 7 x weekly - 3 sets - 10 reps - 5 seconds hold  - Standing Hip Abduction with Counter Support  - 1 x daily - 7 x weekly - 2-3 sets - 10 reps  Reclined   Hooklying   Hip extension isometrics    L 10x5 seconds for 3 sets     R 10x5 seconds for 3 sets  - Supine Posterior Pelvic Tilt  - 1 x daily - 7 x weekly - 3 sets - 10 reps - 5 seconds hold - Seated Long Arc Quad with Strap  - 2 x daily - 4 x weekly - 4 sets - 5 reps - 5 seconds hold       PT Short Term Goals - 05/16/22 1742       PT SHORT TERM GOAL #1   Title Pt will be independent with her initial HEP to decrease R thigh, knee and back pain, improve B LE strength and ability to ambulate with less difficulty.    Baseline No questions with her initial HEP (05/16/2022)    Time 3    Period Weeks    Status Achieved    Target Date 04/05/22  PT Long Term Goals - 05/16/22 1510       PT LONG TERM GOAL #1   Title Pt will have a decrease in R knee pain to 4/10 or less at worst to promote ability to ambulate with less difficulty.    Baseline R knee pain 9/10 at worst for the past 3 months (03/13/2022); R knee overall feels better compared to prior to starting PT, 7/10 R lateral femoral condyle at most for the past 7 days (05/16/2022)    Time 6    Period Weeks    Status Partially Met    Target Date 06/28/22      PT LONG TERM GOAL #2   Title Pt  will have a decrease in mid back pain to 4/10 or less at worst to promote ability to ambulate, perform standing tasks with less difficulty.    Baseline 10/10 mid back pain at worst for the past 3-4 days (03/13/2022); 8/10 at worst for the past 2 weeks, the exercises help (05/16/2022)    Time 6    Period Weeks    Status Partially Met    Target Date 06/28/22      PT LONG TERM GOAL #3   Title Pt will improve B hip flexion, extension, abduction, and knee extension strength by at least 1/2 MMT grade to promote ability to ambulate with less difficulty.    Baseline Hip flexion 4-/5 R, 3/5 L, hip extension 4-/5 R and L, hip abduction 4-/5 R, 3+/5 L, knee extension 4+/5 R, 3-/5 L (03/13/2022); seated manually resisted: hip flexion 4-/5 R, 4-/5 L, hip extension 4/5 R and L, hip abduction 4-/5 R, 3+/5 L, knee extension 4+/5 R, 3-/5 L (05/16/2022)    Time 6    Period Weeks    Status Partially Met    Target Date 06/28/22      PT LONG TERM GOAL #4   Title Pt will improve her FOTO score by at least 10 points as a demonstration of improved function.    Baseline Initial FOTO score not yet obtained. (03/13/2022); Upper leg FOTO 39 (03/21/2022); Upper leg FOTO 35. Pt however states legs feel better since starting PT (05/16/2022)    Time 6    Period Weeks    Status On-going    Target Date 06/28/22              Plan - 05/23/22 1341     Clinical Impression Statement Good carry over of decreased back pain from previous session based on subjective reports of no back pain currently. Continued working in improving L hip flexion and knee extension strength to promote ability to advance L LE forward during gait to decrease R trunk muscle overuse/compensation. Worked on trunk strength as well to decrease stress to low back during gait. Difficulty with trunk muscle use observed. Pt tolerated session well without aggravation of symptoms. Pt will benefit from continued skilled physical therapy services to decrease pain,  improve strength and function.    Personal Factors and Comorbidities Comorbidity 3+;Past/Current Experience;Time since onset of injury/illness/exacerbation;Fitness    Comorbidities Anxiety, arthritis, COPD, DM, depression, HTN    Examination-Activity Limitations Stairs;Lift;Caring for Others;Transfers;Carry;Locomotion Level;Squat    Stability/Clinical Decision Making Stable/Uncomplicated    Clinical Decision Making Low    Rehab Potential Fair    PT Frequency 2x / week    PT Duration 8 weeks    PT Treatment/Interventions Electrical Stimulation;Iontophoresis '4mg'$ /ml Dexamethasone;Gait training;Stair training;Functional mobility training;Therapeutic activities;Therapeutic exercise;Balance training;Neuromuscular re-education;Patient/family education;Manual techniques;Dry needling  PT Next Visit Plan FU on thoracic extension intervention and myofascial release to Rt periscapular zone    PT Home Exercise Plan no changes to program this visit; discussed trial of throacic towel roll if desired, but prefer to try this here first a few times before adding to home program    Consulted and Agree with Plan of Care Patient                 Joneen Boers PT, DPT  05/23/2022, 2:56 PM

## 2022-05-29 ENCOUNTER — Encounter: Payer: Self-pay | Admitting: Psychiatry

## 2022-05-29 ENCOUNTER — Telehealth (INDEPENDENT_AMBULATORY_CARE_PROVIDER_SITE_OTHER): Payer: 59 | Admitting: Psychiatry

## 2022-05-29 DIAGNOSIS — F331 Major depressive disorder, recurrent, moderate: Secondary | ICD-10-CM | POA: Diagnosis not present

## 2022-05-29 DIAGNOSIS — G4701 Insomnia due to medical condition: Secondary | ICD-10-CM | POA: Diagnosis not present

## 2022-05-29 DIAGNOSIS — F411 Generalized anxiety disorder: Secondary | ICD-10-CM

## 2022-05-29 MED ORDER — FLUOXETINE HCL 20 MG PO CAPS: 20.0000 mg | ORAL_CAPSULE | Freq: Every day | ORAL | 1 refills | Status: DC

## 2022-05-29 MED ORDER — LAMOTRIGINE 25 MG PO TABS: 25.0000 mg | ORAL_TABLET | Freq: Every day | ORAL | 1 refills | Status: DC

## 2022-05-29 NOTE — Progress Notes (Unsigned)
Virtual Visit via Video Note  I connected with Traci Davis on 05/29/22 at  3:30 PM EST by a video enabled telemedicine application and verified that I am speaking with the correct person using two identifiers.  Location Provider Location : [facility] Patient Location : Home  Participants: Patient , Provider    I discussed the limitations of evaluation and management by telemedicine and the availability of in person appointments. The patient expressed understanding and agreed to proceed.    I discussed the assessment and treatment plan with the patient. The patient was provided an opportunity to ask questions and all were answered. The patient agreed with the plan and demonstrated an understanding of the instructions.   The patient was advised to call back or seek an in-person evaluation if the symptoms worsen or if the condition fails to improve as anticipated.   [facility] MD OP Progress Note  05/29/2022 3:49 PM Traci Davis  MRN:  [id number]  Chief Complaint:  Chief Complaint  Patient presents with   Follow-up   Anxiety   Depression   Medication Refill   HPI: Traci Davis is a 52 year old Caucasian female, lives in [facility], has a history of MDD, GAD, insomnia, cerebral palsy, diabetes mellitus, on SSD.  Waited by telemedicine today.  Patient today reports she continues to struggle with a lot of irritability, mood swings, agitation, anxiety.  She reports she is in a lot of pain, pain is all over her body.  Her provider recently made some changes with her medications however that does not seem to be beneficial.  She continues to struggle.  She reports she struggles with sleep hygiene since she has to drop off her daughter off and pick her up from work.  Patient reports she is currently taking the higher dosage of Prozac and it is hard for her to say if it is beneficial for her mood or not.  She is not interested in dosage increase.  Patient denies any suicidality, homicidality or  perceptual disturbances.  Patient denies any other concerns today.  Visit Diagnosis:    ICD-10-CM   1. MDD (major depressive disorder), recurrent episode, moderate (HCC)  F33.1 lamoTRIgine (LAMICTAL) 25 MG tablet    2. GAD (generalized anxiety disorder)  F41.1 lamoTRIgine (LAMICTAL) 25 MG tablet    3. Insomnia due to medical condition  G47.01    pain, mood      Past Psychiatric History: Reviewed past psychiatric history from progress note on 09/12/2017.  Past trials of Xanax, Klonopin, Cymbalta, Rozerem, Ambien, trazodone, doxepin.  Past Medical History:  Past Medical History:  Diagnosis Date   Acute pancreatitis 09/04/2014   Anxiety    Arthritis    Asthma    Cerebral palsy (HCC)    Complication of anesthesia    panic attacks before surgery   COPD (chronic obstructive pulmonary disease) ([place])    Depression    Diabetes mellitus without complication ([place])    GERD (gastroesophageal reflux disease)    Hypertension    Noninfectious gastroenteritis and colitis 01/10/2013    Past Surgical History:  Procedure Laterality Date   CESAREAN SECTION  [place] N/A 02/20/2018   Procedure: DILATATION & CURETTAGE/HYSTEROSCOPY WITH MYOSURE ENDOMETRIAL POLYPECTOMY;  Surgeon: Will Bonnet, MD;  Location: [facility] ORS;  Service: Gynecology;  Laterality: N/A;   ESOPHAGOGASTRODUODENOSCOPY (EGD) WITH PROPOFOL N/A 01/01/2019   Procedure: ESOPHAGOGASTRODUODENOSCOPY (EGD) WITH PROPOFOL;  Surgeon: Lin Landsman, MD;  Location: [facility] ENDOSCOPY;  Service: Gastroenterology;  Laterality: N/A;   FOOT CAPSULE RELEASE W/ PERCUTANEOUS HEEL CORD LENGTHENING, TIBIAL TENDON TRANSFER     age 61 ,and 2010-both legs   JOINT REPLACEMENT Right    both knees partial replacements dates unknown   knee replacment     x 5 per pt. last one- left knee 2014. has had 2 on R 3 on L   TYMPANOPLASTY WITH GRAFT Right 01/08/2018   Procedure: TYMPANOPLASTY  WITH POSSIBLE OSSICULAR CHAIN RECONSTRUCTION;  Surgeon: [name], MD;  Location: [facility] ORS;  Service: ENT;  Laterality: Right;    Family Psychiatric History: Reviewed family psychiatric history from progress note on 09/12/2017.  Family History:  Family History  Problem Relation Age of Onset   CAD Father    Heart attack Brother    Sexual abuse Paternal Grandfather    Breast cancer Neg Hx     Social History: Reviewed social history from progress note on 09/12/2017. Social History   Socioeconomic History   Marital status: Single    Spouse name: Not on file   Number of children: 1   Years of education: Not on file   Highest education level: Associate degree: occupational, Hotel manager, or vocational program  Occupational History   Not on file  Tobacco Use   Smoking status: Never   Smokeless tobacco: Never  Vaping Use   Vaping Use: Never used  Substance and Sexual Activity   Alcohol use: No   Drug use: No   Sexual activity: Yes    Partners: Male    Birth control/protection: Condom  Other Topics Concern   Not on file  Social History Narrative   Not on file   Social Determinants of Health   Financial Resource Strain: Not on file  Food Insecurity: Not on file  Transportation Needs: Not on file  Physical Activity: Not on file  Stress: Not on file  Social Connections: Not on file    Allergies:  Allergies  Allergen Reactions   Meloxicam Other (See Comments)    Damage kidney   Morphine And Related Other (See Comments)    hallucinations   Vantin [Cefpodoxime] Nausea And Vomiting   Latex Itching   Baclofen Other (See Comments)    "makes cerebral palsy do adverse reaction on me", tightens muscles    Ciprofloxacin Itching and Nausea And Vomiting   Tape Rash    skin tears.  Paper tape is ok   Tramadol Nausea Only    Metabolic Disorder Labs: Lab Results  Component Value Date   HGBA1C 6.1 (H) 02/19/2015   No results found for: "PROLACTIN" No results found for:  "CHOL", "TRIG", "HDL", "CHOLHDL", "VLDL", "LDLCALC" Lab Results  Component Value Date   TSH 2.198 07/10/2016   TSH 2.891 02/19/2015    Therapeutic Level Labs: No results found for: "LITHIUM" No results found for: "VALPROATE" No results found for: "CBMZ"  Current Medications: Current Outpatient Medications  Medication Sig Dispense Refill   acetaminophen (TYLENOL) 325 MG tablet Take 2 tablets (650 mg total) by mouth every 6 (six) hours as needed. 60 tablet 0   albuterol (PROVENTIL HFA;VENTOLIN HFA) 108 (90 BASE) MCG/ACT inhaler Inhale 2 puffs into the lungs 4 (four) times daily as needed. For shortness of breath and/or wheezing     ANORO ELLIPTA 62.5-25 MCG/INH AEPB      atorvastatin (LIPITOR) 20 MG tablet Take 1 tablet by mouth daily.     azelastine (ASTELIN) 0.1 % nasal spray Place into both nostrils.  Blood Glucose Monitoring Suppl (GLUCOCOM BLOOD GLUCOSE MONITOR) DEVI Test daily before all meals/snacks and once before bedtime.     budesonide-formoterol (SYMBICORT) 80-4.5 MCG/ACT inhaler Inhale 2 puffs up to 4 times daily as needed for symptoms of wheezing or shortness of breath.     Continuous Blood Gluc Receiver (DEXCOM G6 RECEIVER) DEVI 1 Units by Miscellaneous route continuous.     Continuous Blood Gluc Transmit (DEXCOM G6 TRANSMITTER) MISC 1 Units by Miscellaneous route Every three (3) months.     CREON 36000 units CPEP capsule      cyclobenzaprine (FLEXERIL) 10 MG tablet Take by mouth.     diazepam (VALIUM) 5 MG tablet Take by mouth.     diclofenac Sodium (VOLTAREN) 1 % GEL Apply topically.     dicyclomine (BENTYL) 10 MG capsule Take by mouth.     donepezil (ARICEPT) 10 MG tablet Take by mouth.     glucose blood test strip USE TO TEST BLOOD SUGAR TWO TIMES A DAY     incobotulinumtoxinA (XEOMIN) 100 units SOLR injection Inject 100 Units into the muscle every 3 (three) months.      lamoTRIgine (LAMICTAL) 25 MG tablet Take 1 tablet (25 mg total) by mouth daily. 30 tablet 1    Lancets (FREESTYLE) lancets Test daily before all meals/snacks     LANTUS SOLOSTAR 100 UNIT/ML Solostar Pen Inject into the skin.     lidocaine (LIDODERM) 5 % Place onto the skin.     lisinopril (ZESTRIL) 20 MG tablet Take 1 tablet by mouth daily.     Melatonin 10 MG TBCR Take by mouth.     metFORMIN (GLUCOPHAGE-XR) 500 MG 24 hr tablet Take 1,000 mg by mouth 2 (two) times daily.     metoCLOPramide (REGLAN) 5 MG tablet Take 1 tablet (5 mg total) by mouth 4 (four) times daily -  before meals and at bedtime for 14 days. 56 tablet 0   mirabegron ER (MYRBETRIQ) 25 MG TB24 tablet Take 1 tablet by mouth daily.     mirtazapine (REMERON) 15 MG tablet TAKE 1 TABLET BY MOUTH AT  BEDTIME FOR SLEEP 90 tablet 3   montelukast (SINGULAIR) 10 MG tablet Take 1 tablet by mouth daily.     naloxone (NARCAN) nasal spray 4 mg/0.1 mL One spray in either nostril once for known/suspected opioid overdose. May repeat every 2-3 minutes in alternating nostril til EMS arrives     nystatin (MYCOSTATIN/NYSTOP) powder Apply topically.     omeprazole (PRILOSEC) 40 MG capsule Take by mouth.     ondansetron (ZOFRAN) 4 MG tablet Take 1 tablet (4 mg total) by mouth as needed for nausea or vomiting. 30 tablet 1   oxyCODONE (OXY IR/ROXICODONE) 5 MG immediate release tablet Take 5 mg by mouth 4 (four) times daily as needed.     oxyCODONE-acetaminophen (PERCOCET) 10-325 MG tablet Take 1 tablet by mouth 3 (three) times daily as needed.     XTAMPZA ER 13.5 MG C12A Take by mouth daily as needed.     fluconazole (DIFLUCAN) 150 MG tablet Take 150 mg by mouth once.     FLUoxetine (PROZAC) 20 MG capsule Take 1 capsule (20 mg total) by mouth daily. Take along with 40 mg daily - total of 60 mg daily 30 capsule 1   FLUoxetine (PROZAC) 40 MG capsule Take 1 capsule (40 mg total) by mouth daily. Take along with 20 mg daily. 90 capsule 0   fluticasone (FLONASE) 50 MCG/ACT nasal spray 1 spray into each  nostril two (2) times a day.     furosemide  (LASIX) 40 MG tablet Take 40 mg by mouth daily as needed for fluid.      GLOBAL EASE INJECT PEN NEEDLES 31G X 5 MM MISC Inject into the skin.     HYDROmorphone (DILAUDID) 2 MG tablet Take 2 mg by mouth 4 (four) times daily. (Patient not taking: Reported on 05/29/2022)     insulin glargine (LANTUS) 100 UNIT/ML Solostar Pen Inject into the skin.     LINZESS 145 MCG CAPS capsule Take 1 capsule by mouth every morning  for constipation (Patient not taking: Reported on 05/29/2022)     meclizine (ANTIVERT) 25 MG tablet Take by mouth. (Patient not taking: Reported on 05/29/2022)     pregabalin (LYRICA) 50 MG capsule Take 50 mg by mouth 3 (three) times daily. (Patient not taking: Reported on 05/29/2022)     promethazine (PHENERGAN) 25 MG tablet Take by mouth. (Patient not taking: Reported on 05/29/2022)     solifenacin (VESICARE) 10 MG tablet Take 10 mg by mouth daily. (Patient not taking: Reported on 05/29/2022)     No current facility-administered medications for this visit.     Musculoskeletal: Strength & Muscle Tone:  UTA Gait & Station:  Laying down Patient leans: N/A  Psychiatric Specialty Exam: Review of Systems  Musculoskeletal:  Positive for arthralgias.       Hurts all over  Psychiatric/Behavioral:  Positive for dysphoric mood and sleep disturbance. The patient is nervous/anxious.   All other systems reviewed and are negative.   Last menstrual period 08/20/2014.There is no height or weight on file to calculate BMI.  General Appearance: Casual  Eye Contact:  Fair  Speech:  Normal Rate  Volume:  Normal  Mood:  Anxious and Depressed  Affect:  Congruent  Thought Process:  Goal Directed and Descriptions of Associations: Intact  Orientation:  Full (Time, Place, and Person)  Thought Content: Logical   Suicidal Thoughts:  No  Homicidal Thoughts:  No  Memory:  Immediate;   Fair Recent;   Fair Remote;   Fair  Judgement:  Fair  Insight:  Fair  Psychomotor Activity:  Normal  Concentration:   Concentration: Fair and Attention Span: Fair  Recall:  [employer] of Knowledge: Fair  Language: Fair  Akathisia:  No  Handed:  Right  AIMS (if indicated): not done  Assets:  Communication Skills Desire for Improvement Housing Social Support  ADL's:  Intact  Cognition: WNL  Sleep:   restless   Screenings: Administrator, Civil Service Office Visit from 04/05/2022 in [place] Office Visit from 10/16/2021 in [place] Total Score 0 0      GAD-7    [place] Office Visit from 04/05/2022 in [employer] Video Visit from 06/06/2021 in [place]  Total GAD-7 Score 8 1      PHQ2-9    [place] Office Visit from 04/05/2022 in [place] Video Visit from 11/13/2021 in [place] Office Visit from 10/16/2021 in [place] Video Visit from 06/06/2021 in [place] Office Visit from 05/23/2021 in [place]  PHQ-2 Total Score 2 0 1 0 0  PHQ-9 Total Score 9 -- 5 -- --      [employer] Office Visit  from 04/05/2022 in [place] Video Visit from 11/13/2021 in [place] Office Visit from 10/16/2021 in [facility]  C-SSRS RISK CATEGORY No Risk No Risk No Risk        Assessment and Plan: Traci Davis is a 52 year old Caucasian female who has a history of depression, anxiety, chronic pain was evaluated by telemedicine today.  Patient continues to struggle with mood swings, anxiety, sleep problems, will benefit from sufficient pain management, will add a trial of mood stabilizer to see if this will  help with her mood.  Plan MDD-unstable Prozac 60 mg p.o. daily Patient was referred for CBT in the past. Start Lamictal 25 mg p.o. daily.  Provided medication education including Stevens-Johnson syndrome dose.  GAD-unstable Prozac 60 mg p.o. daily Mirtazapine 15 mg p.o. nightly Patient will need sufficient pain management  Insomnia-unstable Patient reports sleep is unstable due to pain as well as not having a good sleep hygiene Provided education Continue mirtazapine 15 mg p.o. nightly  Follow-up in clinic in 4 to 5 weeks or sooner if needed.   Collaboration of Care: Collaboration of Care: Other encouraged to follow up with pain provider.  Patient/Guardian was advised Release of Information must be obtained prior to any record release in order to collaborate their care with an outside provider. Patient/Guardian was advised if they have not already done so to contact the registration department to sign all necessary forms in order for [place] to release information regarding their care.   Consent: Patient/Guardian gives verbal consent for treatment and assignment of benefits for services provided during this visit. Patient/Guardian expressed understanding and agreed to proceed.    This note was generated in part or whole with voice recognition software. Voice recognition is usually quite accurate but there are transcription errors that can and very often do occur. I apologize for any typographical errors that were not detected and corrected.     Ursula Alert, MD 05/29/2022, 3:49 PM

## 2022-05-30 NOTE — Patient Instructions (Signed)
Lamotrigine Tablets What is this medication? LAMOTRIGINE (la MOE Hendricks Limes) prevents and controls seizures in people with epilepsy. It may also be used to treat bipolar disorder. It works by calming overactive nerves in your body. This medicine may be used for other purposes; ask your health care provider or pharmacist if you have questions. COMMON BRAND NAME(S): Lamictal, Subvenite What should I tell my care team before I take this medication? They need to know if you have any of these conditions: Heart disease History of irregular heartbeat Immune system problems Kidney disease Liver disease Low levels of folic acid in the blood Lupus Mental health condition Suicidal thoughts, plans, or attempt by you or a family member An unusual or allergic reaction to lamotrigine, other medications, foods, dyes, or preservatives Pregnant or trying to get pregnant Breastfeeding How should I use this medication? Take this medication by mouth with a glass of water. Follow the directions on the prescription label. Do not chew these tablets. If this medication upsets your stomach, take it with food or milk. Take your doses at regular intervals. Do not take your medication more often than directed. A special MedGuide will be given to you by the pharmacist with each new prescription and refill. Be sure to read this information carefully each time. Talk to your care team about the use of this medication in children. While this medication may be prescribed for children as young as 2 years for selected conditions, precautions do apply. Overdosage: If you think you have taken too much of this medicine contact a poison control center or emergency room at once. NOTE: This medicine is only for you. Do not share this medicine with others. What if I miss a dose? If you miss a dose, take it as soon as you can. If it is almost time for your next dose, take only that dose. Do not take double or extra doses. What may  interact with this medication? Atazanavir Certain medications for irregular heartbeat Certain medications for seizures, such as carbamazepine, phenobarbital, phenytoin, primidone, or valproic acid Estrogen or progestin hormones Lopinavir Rifampin Ritonavir This list may not describe all possible interactions. Give your health care provider a list of all the medicines, herbs, non-prescription drugs, or dietary supplements you use. Also tell them if you smoke, drink alcohol, or use illegal drugs. Some items may interact with your medicine. What should I watch for while using this medication? Visit your care team for regular checks on your progress. If you take this medication for seizures, wear a Medic Alert bracelet or necklace. Carry an identification card with information about your condition, medications, and care team. It is important to take this medication exactly as directed. When first starting treatment, your dose will need to be adjusted slowly. It may take weeks or months before your dose is stable. You should contact your care team if your seizures get worse or if you have any new types of seizures. Do not stop taking this medication unless instructed by your care team. Stopping your medication suddenly can increase your seizures or their severity. This medication may cause serious skin reactions. They can happen weeks to months after starting the medication. Contact your care team right away if you notice fevers or flu-like symptoms with a rash. The rash may be red or purple and then turn into blisters or peeling of the skin. You may also notice a red rash with swelling of the face, lips, or lymph nodes in your neck or under your  arms. This medication may affect your coordination, reaction time, or judgment. Do not drive or operate machinery until you know how this medication affects you. Sit up or stand slowly to reduce the risk of dizzy or fainting spells. Drinking alcohol with this  medication can increase the risk of these side effects. If you are taking this medication for bipolar disorder, it is important to report any changes in your mood to your care team. If your condition gets worse, you get mentally depressed, feel very hyperactive or manic, have difficulty sleeping, or have thoughts of hurting yourself or committing suicide, you need to get help from your care team right away. If you are a caregiver for someone taking this medication for bipolar disorder, you should also report these behavioral changes right away. The use of this medication may increase the chance of suicidal thoughts or actions. Pay special attention to how you are responding while on this medication. Your mouth may get dry. Chewing sugarless gum or sucking hard candy and drinking plenty of water may help. Contact your care team if the problem does not go away or is severe. If you become pregnant while using this medication, you may enroll in the [employer] Pregnancy Registry by calling [phone]. This registry collects information about the safety of antiepileptic medication use during pregnancy. This medication may cause a decrease in folic acid. You should make sure that you get enough folic acid while you are taking this medication. Discuss the foods you eat and the vitamins you take with your care team. What side effects may I notice from receiving this medication? Side effects that you should report to your care team as soon as possible: Allergic reactions--skin rash, itching, hives, swelling of the face, lips, tongue, or throat Change in vision Fever, neck pain or stiffness, sensitivity to light, headache, nausea, vomiting, confusion Heart rhythm changes--fast or irregular heartbeat, dizziness, feeling faint or lightheaded, chest pain, trouble breathing Infection--fever, chills, cough, or sore throat Liver injury--right upper belly pain, loss of appetite, nausea,  light-colored stool, dark yellow or brown urine, yellowing skin or eyes, unusual weakness or fatigue Low red blood cell count--unusual weakness or fatigue, dizziness, headache, trouble breathing Rash, fever, and swollen lymph nodes Redness, blistering, peeling or loosening of the skin, including inside the mouth Thoughts of suicide or self-harm, worsening mood, or feelings of depression Unusual bruising or bleeding Side effects that usually do not require medical attention (report to your care team if they continue or are bothersome): Diarrhea Dizziness Drowsiness Headache Nausea Stomach pain Tremors or shaking This list may not describe all possible side effects. Call your doctor for medical advice about side effects. You may report side effects to FDA at 1-800-FDA-1088. Where should I keep my medication? Keep out of the reach of children and pets. Store at [employer] C (77 degrees F). Protect from light. Get rid of any unused medication after the expiration date. To get rid of medications that are no longer needed or have expired: Take the medication to a medication take-back program. Check with your pharmacy or law enforcement to find a location. If you cannot return the medication, check the label or package insert to see if the medication should be thrown out in the garbage or flushed down the toilet. If you are not sure, ask your care team. If it is safe to put it in the trash, empty the medication out of the container. Mix the medication with cat litter, dirt, coffee grounds,  or other unwanted substance. Seal the mixture in a bag or container. Put it in the trash. NOTE: This sheet is a summary. It may not cover all possible information. If you have questions about this medicine, talk to your doctor, pharmacist, or health care provider.  2023 Elsevier/Gold Standard (2020-07-29 00:00:00)

## 2022-05-31 ENCOUNTER — Ambulatory Visit: Payer: 59 | Attending: Physician Assistant

## 2022-05-31 DIAGNOSIS — M6281 Muscle weakness (generalized): Secondary | ICD-10-CM

## 2022-05-31 DIAGNOSIS — M25531 Pain in right wrist: Secondary | ICD-10-CM | POA: Diagnosis present

## 2022-05-31 DIAGNOSIS — M5459 Other low back pain: Secondary | ICD-10-CM | POA: Diagnosis present

## 2022-05-31 DIAGNOSIS — G801 Spastic diplegic cerebral palsy: Secondary | ICD-10-CM

## 2022-05-31 DIAGNOSIS — M25561 Pain in right knee: Secondary | ICD-10-CM | POA: Diagnosis present

## 2022-05-31 DIAGNOSIS — G5603 Carpal tunnel syndrome, bilateral upper limbs: Secondary | ICD-10-CM | POA: Diagnosis present

## 2022-05-31 DIAGNOSIS — M654 Radial styloid tenosynovitis [de Quervain]: Secondary | ICD-10-CM | POA: Insufficient documentation

## 2022-05-31 DIAGNOSIS — R262 Difficulty in walking, not elsewhere classified: Secondary | ICD-10-CM

## 2022-05-31 DIAGNOSIS — G8929 Other chronic pain: Secondary | ICD-10-CM | POA: Diagnosis present

## 2022-05-31 NOTE — Therapy (Signed)
OUTPATIENT PHYSICAL THERAPY TREATMENT NOTE    Patient Name: Traci Davis MRN: [id number] DOB:[birth year], 52 y.o., female Today's Date: 05/31/2022  PCP: Konrad Saha, MD  REFERRING PROVIDER: Leretha Dykes, DO    END OF SESSION:  PT End of Session - 05/31/22 1442     Visit Number 12    Number of Visits 29    Date for PT Re-Evaluation 06/28/22    Authorization Type [employer] Medicare    Authorization Time Period 03/13/22-05/10/22    Progress Note Due on Visit 10    PT Start Time 0300    PT Stop Time 1529    PT Time Calculation (min) 46 min    Activity Tolerance Patient tolerated treatment well;No increased pain    Behavior During Therapy [facility] for tasks assessed/performed                      Past Medical History:  Diagnosis Date   Acute pancreatitis 09/04/2014   Anxiety    Arthritis    Asthma    Cerebral palsy (HCC)    Complication of anesthesia    panic attacks before surgery   COPD (chronic obstructive pulmonary disease) ([place])    Depression    Diabetes mellitus without complication ([place])    GERD (gastroesophageal reflux disease)    Hypertension    Noninfectious gastroenteritis and colitis 01/10/2013   Past Surgical History:  Procedure Laterality Date   CESAREAN SECTION  [place] N/A 02/20/2018   Procedure: DILATATION & CURETTAGE/HYSTEROSCOPY WITH MYOSURE ENDOMETRIAL POLYPECTOMY;  Surgeon: Will Bonnet, MD;  Location: [facility];  Service: Gynecology;  Laterality: N/A;   ESOPHAGOGASTRODUODENOSCOPY (EGD) WITH PROPOFOL N/A 01/01/2019   Procedure: ESOPHAGOGASTRODUODENOSCOPY (EGD) WITH PROPOFOL;  Surgeon: Lin Landsman, MD;  Location: [place];  Service: Gastroenterology;  Laterality: N/A;   FOOT CAPSULE RELEASE W/ PERCUTANEOUS HEEL CORD LENGTHENING, TIBIAL TENDON TRANSFER     age 31 ,and 2010-both legs   JOINT REPLACEMENT Right    both knees partial replacements  dates unknown   knee replacment     x 5 per pt. last one- left knee 2014. has had 2 on R 3 on L   TYMPANOPLASTY WITH GRAFT Right 01/08/2018   Procedure: TYMPANOPLASTY WITH POSSIBLE OSSICULAR CHAIN RECONSTRUCTION;  Surgeon: [name], MD;  Location: [facility];  Service: ENT;  Laterality: Right;   Patient Active Problem List   Diagnosis Date Noted   Medication reaction 08/29/2021   No-show for appointment 12/22/2020   Obesity due to excess calories 09/22/2020   Nausea 09/20/2020   Sinusitis 08/09/2020   Insomnia due to medical condition 05/11/2020   COPD (chronic obstructive pulmonary disease) ([place]) 03/22/2020   Hamstring tendonitis 01/29/2020   MDD (major depressive disorder), recurrent, in full remission ([place]) 08/12/2019   Seizure-like activity ([place]) 07/08/2019   Mild episode of recurrent major depressive disorder ([place]) 12/29/2018   Insomnia due to mental condition 12/29/2018   Disorder of vein 03/04/2018   Lumbar sprain 03/04/2018   Muscle weakness 03/04/2018   Neck sprain 03/04/2018   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   Postmenopausal bleeding 02/18/2018   Impingement syndrome of shoulder region 02/04/2018   Chest pain with moderate risk for cardiac etiology 05/19/2017   History of depression 04/15/2017   GAD (generalized anxiety disorder) 04/15/2017   Chronic daily headache 04/15/2017   Panic attack 04/15/2017   Nocturnal enuresis 04/15/2017   Mild cognitive impairment  04/15/2017   Loss of memory 01/14/2017   Pain medication agreement signed 01/08/2017   Headache disorder 12/04/2016   Chronic, continuous use of opioids 07/01/2015   Vertigo 07/01/2015   Chronic midline low back pain without sciatica 03/21/2015   Acute renal failure (ARF) ([place]) 02/19/2015   Type 2 diabetes mellitus without complication ([place]) 03/55/9741   Cerebral palsy ([place]) 01/05/2014   Cerebral palsy with spastic/ataxic diplegia 01/05/2014   Hip pain, chronic 04/28/2013   Essential hypertension  01/10/2013   Irritable colon 01/10/2013   Encounter for long-term (current) use of other medications 12/04/2012   Chronic GERD 08/12/2012   Epigastric pain 08/12/2012   Diarrhea 08/12/2012   Primary localized osteoarthrosis, lower leg 05/07/2012   Difficulty walking 02/06/2012    REFERRING DIAG: G80.1 (ICD-10-CM) - 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M79.604 (ICD-10-CM) - Pain in right leg M79.605 (ICD-10-CM) - Pain in left leg   THERAPY DIAG:  Difficulty in walking, not elsewhere classified  Chronic pain of right knee  Muscle weakness (generalized)  Other low back pain  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(HCC)  Rationale for Evaluation and Treatment Rehabilitation  PERTINENT HISTORY: Pain in R and L legs, spastic diplegic cerebral palsy. Hurt her R mid back, feels like she pulled a muscle, making it difficult for her to reach across. Feels like someone is grinding their knuckles on her R mid back, feels like she pulled a muscle. R knee and knee cap has been bothering her. L LE has been dragging worse than last time. Takes fluid pills for swelling in her legs. No L LE pain. Recently got a new KAFO and finally broke them in (was way too tight when pt first put it on. Both legs feel weak. Has a knee brace for her R knee cap to help keep it from collapsing. Got trigger point injections for her neck and back this month (November 2023). L LE feels weak.   PRECAUTIONS: fall risk  SUBJECTIVE:     SUBJECTIVE STATEMENT: Back is ok, no pain currently. No pain in R and L knee currently while walking. Muscle soreness in her trunk is better, fine after 2 days. Got trigger point injections in her neck yesterday.   PAIN:  Are you having pain? See subjective      TODAY'S TREATMENT:                                                                                                                                         DATE: 05/31/2022  Latex Allergies  Therapeutic exercise  Gait from waiting room  to treatment room with cues for proper L LE placement.   Sitting with upright posture   Gentle PT manual trunk perturbation all planes 1 minute x 3   Difficulty performing   B scapular retraction 10x3 with 5 second holds   Max tactile cues for proper scapular mechanics R > L decreasing  shoulder shrug   To promote thoracic extension to decrease stress to low back.    Seated L hip flexion to promote L LE swing phase of gait with less use of R trunk muscles 10x2  Seated L knee extension with PT assist to end range 10x5 seconds for 3 sets   Sitting with upright posture   PT manually resisted trunk flexion isometrics in neutral 8x5 seconds, 2 sets  Seated trunk flexion stretch 7x10 seconds      Gait from treatment room to outside                 Cues for L hip circumduction, placing body weight onto L LE with B UE assist from rw to promote R knee flexion in neutral for swing phase, and neutral knee for foot flat and push-off to decrease R hip IR and R knee genu valgus, and therefore decreasing stress to R knee.    Improved exercise technique, movement at target joints, use of target muscles after mod verbal, visual, tactile cues.     Response to treatment Pt tolerated session well without aggravation of symptoms.        Clinical impression Improving ability to ambulate with less R hip IR and knee genu valgus observed. Continued working on trunk strength to decrease stress to low back during standing tasks. Continued working on improving L hip flexion and knee extension strength to promote ability to advance L LE forward during gait to decrease R trunk muscle overuse/compensation. Pt tolerated session well without aggravation of symptoms. Pt will benefit from continued skilled physical therapy services to decrease pain, improve strength and function.        PATIENT EDUCATION: Education details: HEP, there-ex Person educated: Patient Education method: Explanation,  Demonstration, Tactile cues, Verbal cues, and Handouts Education comprehension: verbalized understanding and returned demonstration  HOME EXERCISE PROGRAM: Access Code: [email or web address] URL: [email or web address] Date: 03/21/2022 Prepared by: Joneen Boers  Exercises - Seated Hip Abduction with Resistance  - 1 x daily - 7 x weekly - 2 sets - 10 reps  Red band  - Seated Hip Adduction Isometrics with Ball  - 1 x daily - 7 x weekly - 3 sets - 10 reps - 5 seconds hold  - Standing Hip Abduction with Counter Support  - 1 x daily - 7 x weekly - 2-3 sets - 10 reps  Reclined   Hooklying   Hip extension isometrics    L 10x5 seconds for 3 sets     R 10x5 seconds for 3 sets  - Supine Posterior Pelvic Tilt  - 1 x daily - 7 x weekly - 3 sets - 10 reps - 5 seconds hold - Seated Long Arc Quad with Strap  - 2 x daily - 4 x weekly - 4 sets - 5 reps - 5 seconds hold       PT Short Term Goals - 05/16/22 1742       PT SHORT TERM GOAL #1   Title Pt will be independent with her initial HEP to decrease R thigh, knee and back pain, improve B LE strength and ability to ambulate with less difficulty.    Baseline No questions with her initial HEP (05/16/2022)    Time 3    Period Weeks    Status Achieved    Target Date 04/05/22              PT Long Term Goals - 05/16/22 1510       PT  LONG TERM GOAL #1   Title Pt will have a decrease in R knee pain to 4/10 or less at worst to promote ability to ambulate with less difficulty.    Baseline R knee pain 9/10 at worst for the past 3 months (03/13/2022); R knee overall feels better compared to prior to starting PT, 7/10 R lateral femoral condyle at most for the past 7 days (05/16/2022)    Time 6    Period Weeks    Status Partially Met    Target Date 06/28/22      PT LONG TERM GOAL #2   Title Pt will have a decrease in mid back pain to 4/10 or less at worst to promote ability to ambulate, perform standing tasks with less difficulty.     Baseline 10/10 mid back pain at worst for the past 3-4 days (03/13/2022); 8/10 at worst for the past 2 weeks, the exercises help (05/16/2022)    Time 6    Period Weeks    Status Partially Met    Target Date 06/28/22      PT LONG TERM GOAL #3   Title Pt will improve B hip flexion, extension, abduction, and knee extension strength by at least 1/2 MMT grade to promote ability to ambulate with less difficulty.    Baseline Hip flexion 4-/5 R, 3/5 L, hip extension 4-/5 R and L, hip abduction 4-/5 R, 3+/5 L, knee extension 4+/5 R, 3-/5 L (03/13/2022); seated manually resisted: hip flexion 4-/5 R, 4-/5 L, hip extension 4/5 R and L, hip abduction 4-/5 R, 3+/5 L, knee extension 4+/5 R, 3-/5 L (05/16/2022)    Time 6    Period Weeks    Status Partially Met    Target Date 06/28/22      PT LONG TERM GOAL #4   Title Pt will improve her FOTO score by at least 10 points as a demonstration of improved function.    Baseline Initial FOTO score not yet obtained. (03/13/2022); Upper leg FOTO 39 (03/21/2022); Upper leg FOTO 35. Pt however states legs feel better since starting PT (05/16/2022)    Time 6    Period Weeks    Status On-going    Target Date 06/28/22              Plan - 05/31/22 1442     Clinical Impression Statement Improving ability to ambulate with less R hip IR and knee genu valgus observed. Continued working on trunk strength to decrease stress to low back during standing tasks. Continued working on improving L hip flexion and knee extension strength to promote ability to advance L LE forward during gait to decrease R trunk muscle overuse/compensation. Pt tolerated session well without aggravation of symptoms. Pt will benefit from continued skilled physical therapy services to decrease pain, improve strength and function.    Personal Factors and Comorbidities Comorbidity 3+;Past/Current Experience;Time since onset of injury/illness/exacerbation;Fitness    Comorbidities Anxiety, arthritis, COPD,  DM, depression, HTN    Examination-Activity Limitations Stairs;Lift;Caring for Others;Transfers;Carry;Locomotion Level;Squat    Stability/Clinical Decision Making Stable/Uncomplicated    Rehab Potential Fair    PT Frequency 2x / week    PT Duration 8 weeks    PT Treatment/Interventions Electrical Stimulation;Iontophoresis '4mg'$ /ml Dexamethasone;Gait training;Stair training;Functional mobility training;Therapeutic activities;Therapeutic exercise;Balance training;Neuromuscular re-education;Patient/family education;Manual techniques;Dry needling    PT Next Visit Plan FU on thoracic extension intervention and myofascial release to Rt periscapular zone    PT Home Exercise Plan no changes to program this visit; discussed trial  of throacic towel roll if desired, but prefer to try this here first a few times before adding to home program    Consulted and Agree with Plan of Care Patient                 Joneen Boers PT, DPT  05/31/2022, 3:35 PM

## 2022-06-01 ENCOUNTER — Other Ambulatory Visit: Payer: Self-pay | Admitting: Gastroenterology

## 2022-06-01 DIAGNOSIS — R112 Nausea with vomiting, unspecified: Secondary | ICD-10-CM

## 2022-06-04 ENCOUNTER — Ambulatory Visit: Payer: Medicare Other | Admitting: Gastroenterology

## 2022-06-04 ENCOUNTER — Ambulatory Visit: Payer: 59

## 2022-06-04 DIAGNOSIS — R262 Difficulty in walking, not elsewhere classified: Secondary | ICD-10-CM | POA: Diagnosis not present

## 2022-06-04 DIAGNOSIS — M6281 Muscle weakness (generalized): Secondary | ICD-10-CM

## 2022-06-04 DIAGNOSIS — G801 Spastic diplegic cerebral palsy: Secondary | ICD-10-CM

## 2022-06-04 DIAGNOSIS — M5459 Other low back pain: Secondary | ICD-10-CM

## 2022-06-04 DIAGNOSIS — G8929 Other chronic pain: Secondary | ICD-10-CM

## 2022-06-04 NOTE — Therapy (Signed)
OUTPATIENT PHYSICAL THERAPY TREATMENT NOTE    Patient Name: Traci Davis MRN: [id number] DOB:03/10/1971, 52 y.o., female Today's Date: 06/04/2022  PCP: Konrad Saha, MD  REFERRING PROVIDER: Leretha Dykes, DO    END OF SESSION:  PT End of Session - 06/04/22 1549     Visit Number 13    Number of Visits 29    Date for PT Re-Evaluation 06/28/22    Authorization Type [employer] Medicare    Authorization Time Period 03/13/22-05/10/22    Progress Note Due on Visit 10    PT Start Time 7425    PT Stop Time 9563    PT Time Calculation (min) 44 min    Activity Tolerance Patient tolerated treatment well;No increased pain    Behavior During Therapy [facility] for tasks assessed/performed                       Past Medical History:  Diagnosis Date   Acute pancreatitis 09/04/2014   Anxiety    Arthritis    Asthma    Cerebral palsy (HCC)    Complication of anesthesia    panic attacks before surgery   COPD (chronic obstructive pulmonary disease) ([place])    Depression    Diabetes mellitus without complication ([place])    GERD (gastroesophageal reflux disease)    Hypertension    Noninfectious gastroenteritis and colitis 01/10/2013   Past Surgical History:  Procedure Laterality Date   CESAREAN SECTION  [place] N/A 02/20/2018   Procedure: DILATATION & CURETTAGE/HYSTEROSCOPY WITH MYOSURE ENDOMETRIAL POLYPECTOMY;  Surgeon: Will Bonnet, MD;  Location: [facility];  Service: Gynecology;  Laterality: N/A;   ESOPHAGOGASTRODUODENOSCOPY (EGD) WITH PROPOFOL N/A 01/01/2019   Procedure: ESOPHAGOGASTRODUODENOSCOPY (EGD) WITH PROPOFOL;  Surgeon: Lin Landsman, MD;  Location: [place];  Service: Gastroenterology;  Laterality: N/A;   FOOT CAPSULE RELEASE W/ PERCUTANEOUS HEEL CORD LENGTHENING, TIBIAL TENDON TRANSFER     age 52 ,and 2010-both legs   JOINT REPLACEMENT Right    both knees partial replacements  dates unknown   knee replacment     x 5 per pt. last one- left knee 2014. has had 2 on R 3 on L   TYMPANOPLASTY WITH GRAFT Right 01/08/2018   Procedure: TYMPANOPLASTY WITH POSSIBLE OSSICULAR CHAIN RECONSTRUCTION;  Surgeon: [name], MD;  Location: [facility];  Service: ENT;  Laterality: Right;   Patient Active Problem List   Diagnosis Date Noted   Medication reaction 08/29/2021   No-show for appointment 12/22/2020   Obesity due to excess calories 09/22/2020   Nausea 09/20/2020   Sinusitis 08/09/2020   Insomnia due to medical condition 05/11/2020   COPD (chronic obstructive pulmonary disease) ([place]) 03/22/2020   Hamstring tendonitis 01/29/2020   MDD (major depressive disorder), recurrent, in full remission ([place]) 08/12/2019   Seizure-like activity ([place]) 07/08/2019   Mild episode of recurrent major depressive disorder ([place]) 12/29/2018   Insomnia due to mental condition 12/29/2018   Disorder of vein 03/04/2018   Lumbar sprain 03/04/2018   Muscle weakness 03/04/2018   Neck sprain 03/04/2018   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   Postmenopausal bleeding 02/18/2018   Impingement syndrome of shoulder region 02/04/2018   Chest pain with moderate risk for cardiac etiology 05/19/2017   History of depression 04/15/2017   GAD (generalized anxiety disorder) 04/15/2017   Chronic daily headache 04/15/2017   Panic attack 04/15/2017   Nocturnal enuresis 04/15/2017   Mild cognitive  impairment 04/15/2017   Loss of memory 01/14/2017   Pain medication agreement signed 01/08/2017   Headache disorder 12/04/2016   Chronic, continuous use of opioids 07/01/2015   Vertigo 07/01/2015   Chronic midline low back pain without sciatica 03/21/2015   Acute renal failure (ARF) ([place]) 02/19/2015   Type 2 diabetes mellitus without complication ([place]) 00/92/3300   Cerebral palsy ([place]) 01/05/2014   Cerebral palsy with spastic/ataxic diplegia 01/05/2014   Hip pain, chronic 04/28/2013   Essential hypertension  01/10/2013   Irritable colon 01/10/2013   Encounter for long-term (current) use of other medications 12/04/2012   Chronic GERD 08/12/2012   Epigastric pain 08/12/2012   Diarrhea 08/12/2012   Primary localized osteoarthrosis, lower leg 05/07/2012   Difficulty walking 02/06/2012    REFERRING DIAG: G80.1 (ICD-10-CM) - 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M79.604 (ICD-10-CM) - Pain in right leg M79.605 (ICD-10-CM) - Pain in left leg   THERAPY DIAG:  Difficulty in walking, not elsewhere classified  Chronic pain of right knee  Muscle weakness (generalized)  Other low back pain  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(HCC)  Rationale for Evaluation and Treatment Rehabilitation  PERTINENT HISTORY: Pain in R and L legs, spastic diplegic cerebral palsy. Hurt her R mid back, feels like she pulled a muscle, making it difficult for her to reach across. Feels like someone is grinding their knuckles on her R mid back, feels like she pulled a muscle. R knee and knee cap has been bothering her. L LE has been dragging worse than last time. Takes fluid pills for swelling in her legs. No L LE pain. Recently got a new KAFO and finally broke them in (was way too tight when pt first put it on. Both legs feel weak. Has a knee brace for her R knee cap to help keep it from collapsing. Got trigger point injections for her neck and back this month (November 2023). L LE feels weak.   PRECAUTIONS: fall risk  SUBJECTIVE:     SUBJECTIVE STATEMENT: No back or knee pain currently. Trunk muscle not as sore after last session.    PAIN:  Are you having pain? See subjective      TODAY'S TREATMENT:                                                                                                                                         DATE: 06/04/2022  Latex Allergies  Therapeutic exercise  Gait from waiting room to treatment room with cues for proper L LE placement.   Sitting with upright posture   Gentle PT manual  trunk perturbation all planes 1 minute x 3   Difficulty performing   B scapular retraction 10x3 with 5 second holds   Max tactile cues for proper scapular mechanics R > L decreasing shoulder shrug   To promote thoracic extension to decrease stress to low back.   Seated L knee  extension with PT assist to end range 10x5 seconds for 3 sets  Seated L hip flexion to promote L LE swing phase of gait with less use of R trunk muscles 10x2   Sitting with upright posture   PT manually resisted trunk flexion isometrics in neutral 10x5 seconds, 2 sets   Seated R hip ER, R lateral hip and thigh tightness.    Gait from treatment room to car                 Cues for L hip circumduction, placing body weight onto L LE with B UE assist from rw to promote R knee flexion in neutral for swing phase, and neutral knee for foot flat and push-off to decrease R hip IR and R knee genu valgus, and therefore decreasing stress to R knee.    Improved exercise technique, movement at target joints, use of target muscles after mod verbal, visual, tactile cues.    Manual therapy  Seated STM R vastus lateralis, lateral hamstrings to decrease tension  Decreased R hip and lateral thigh tightness when trying to place R foot onto L knee reported   Response to treatment Pt tolerated session well without aggravation of symptoms.   Decreased R hip and lateral thigh tightness when trying to place R foot onto L knee reported        Clinical impression  Pt returns with no reports of back or B knee pain. Improved exercise tolerance for her trunk based on subjective reports. Continued working on trunk strength to decrease stress to low back during standing tasks. Continued working on improving L hip flexion and knee extension strength to promote ability to advance L LE forward during gait to decrease R trunk muscle overuse/compensation. Pt tolerated session well without aggravation of symptoms. Pt will benefit from  continued skilled physical therapy services to decrease pain, improve strength and function.        PATIENT EDUCATION: Education details: HEP, there-ex Person educated: Patient Education method: Explanation, Demonstration, Tactile cues, Verbal cues, and Handouts Education comprehension: verbalized understanding and returned demonstration  HOME EXERCISE PROGRAM: Access Code: [email or web address] URL: [email or web address] Date: 03/21/2022 Prepared by: Joneen Boers  Exercises - Seated Hip Abduction with Resistance  - 1 x daily - 7 x weekly - 2 sets - 10 reps  Red band  - Seated Hip Adduction Isometrics with Ball  - 1 x daily - 7 x weekly - 3 sets - 10 reps - 5 seconds hold  - Standing Hip Abduction with Counter Support  - 1 x daily - 7 x weekly - 2-3 sets - 10 reps  Reclined   Hooklying   Hip extension isometrics    L 10x5 seconds for 3 sets     R 10x5 seconds for 3 sets  - Supine Posterior Pelvic Tilt  - 1 x daily - 7 x weekly - 3 sets - 10 reps - 5 seconds hold - Seated Long Arc Quad with Strap  - 2 x daily - 4 x weekly - 4 sets - 5 reps - 5 seconds hold       PT Short Term Goals - 05/16/22 1742       PT SHORT TERM GOAL #1   Title Pt will be independent with her initial HEP to decrease R thigh, knee and back pain, improve B LE strength and ability to ambulate with less difficulty.    Baseline No questions with her initial HEP (05/16/2022)    Time 3  Period Weeks    Status Achieved    Target Date 04/05/22              PT Long Term Goals - 05/16/22 1510       PT LONG TERM GOAL #1   Title Pt will have a decrease in R knee pain to 4/10 or less at worst to promote ability to ambulate with less difficulty.    Baseline R knee pain 9/10 at worst for the past 3 months (03/13/2022); R knee overall feels better compared to prior to starting PT, 7/10 R lateral femoral condyle at most for the past 7 days (05/16/2022)    Time 6    Period Weeks    Status Partially  Met    Target Date 06/28/22      PT LONG TERM GOAL #2   Title Pt will have a decrease in mid back pain to 4/10 or less at worst to promote ability to ambulate, perform standing tasks with less difficulty.    Baseline 10/10 mid back pain at worst for the past 3-4 days (03/13/2022); 8/10 at worst for the past 2 weeks, the exercises help (05/16/2022)    Time 6    Period Weeks    Status Partially Met    Target Date 06/28/22      PT LONG TERM GOAL #3   Title Pt will improve B hip flexion, extension, abduction, and knee extension strength by at least 1/2 MMT grade to promote ability to ambulate with less difficulty.    Baseline Hip flexion 4-/5 R, 3/5 L, hip extension 4-/5 R and L, hip abduction 4-/5 R, 3+/5 L, knee extension 4+/5 R, 3-/5 L (03/13/2022); seated manually resisted: hip flexion 4-/5 R, 4-/5 L, hip extension 4/5 R and L, hip abduction 4-/5 R, 3+/5 L, knee extension 4+/5 R, 3-/5 L (05/16/2022)    Time 6    Period Weeks    Status Partially Met    Target Date 06/28/22      PT LONG TERM GOAL #4   Title Pt will improve her FOTO score by at least 10 points as a demonstration of improved function.    Baseline Initial FOTO score not yet obtained. (03/13/2022); Upper leg FOTO 39 (03/21/2022); Upper leg FOTO 35. Pt however states legs feel better since starting PT (05/16/2022)    Time 6    Period Weeks    Status On-going    Target Date 06/28/22              Plan - 06/04/22 1548     Clinical Impression Statement Pt returns with no reports of back or B knee pain. Improved exercise tolerance for her trunk based on subjective reports. Continued working on trunk strength to decrease stress to low back during standing tasks. Continued working on improving L hip flexion and knee extension strength to promote ability to advance L LE forward during gait to decrease R trunk muscle overuse/compensation. Pt tolerated session well without aggravation of symptoms. Pt will benefit from continued  skilled physical therapy services to decrease pain, improve strength and function.    Personal Factors and Comorbidities Comorbidity 3+;Past/Current Experience;Time since onset of injury/illness/exacerbation;Fitness    Comorbidities Anxiety, arthritis, COPD, DM, depression, HTN    Examination-Activity Limitations Stairs;Lift;Caring for Others;Transfers;Carry;Locomotion Level;Squat    Stability/Clinical Decision Making Stable/Uncomplicated    Rehab Potential Fair    PT Frequency 2x / week    PT Duration 8 weeks    PT Treatment/Interventions Electrical Stimulation;Iontophoresis  $'4mg'U$ /ml Dexamethasone;Gait training;Stair training;Functional mobility training;Therapeutic activities;Therapeutic exercise;Balance training;Neuromuscular re-education;Patient/family education;Manual techniques;Dry needling    PT Next Visit Plan FU on thoracic extension intervention and myofascial release to Rt periscapular zone    PT Home Exercise Plan no changes to program this visit; discussed trial of throacic towel roll if desired, but prefer to try this here first a few times before adding to home program    Consulted and Agree with Plan of Care Patient                 Joneen Boers PT, DPT  06/04/2022, 4:42 PM

## 2022-06-06 ENCOUNTER — Ambulatory Visit: Payer: 59

## 2022-06-11 ENCOUNTER — Ambulatory Visit: Payer: 59

## 2022-06-11 DIAGNOSIS — R262 Difficulty in walking, not elsewhere classified: Secondary | ICD-10-CM | POA: Diagnosis not present

## 2022-06-11 DIAGNOSIS — M5459 Other low back pain: Secondary | ICD-10-CM

## 2022-06-11 DIAGNOSIS — G801 Spastic diplegic cerebral palsy: Secondary | ICD-10-CM

## 2022-06-11 DIAGNOSIS — G8929 Other chronic pain: Secondary | ICD-10-CM

## 2022-06-11 DIAGNOSIS — M6281 Muscle weakness (generalized): Secondary | ICD-10-CM

## 2022-06-11 NOTE — Therapy (Signed)
OUTPATIENT PHYSICAL THERAPY TREATMENT NOTE    Patient Name: Traci Davis MRN: [id number] DOB:Sep 22, 1970, 52 y.o., female Today's Date: 06/11/2022  PCP: Konrad Saha, MD  REFERRING PROVIDER: Leretha Dykes, DO    END OF SESSION:  PT End of Session - 06/11/22 1459     Visit Number 14    Number of Visits 29    Date for PT Re-Evaluation 06/28/22    Authorization Type [employer] Medicare    Authorization Time Period 03/13/22-05/10/22    Progress Note Due on Visit 10    PT Start Time 1500    PT Stop Time 1552    PT Time Calculation (min) 52 min    Activity Tolerance Patient tolerated treatment well;No increased pain    Behavior During Therapy [facility] for tasks assessed/performed                        Past Medical History:  Diagnosis Date   Acute pancreatitis 09/04/2014   Anxiety    Arthritis    Asthma    Cerebral palsy (HCC)    Complication of anesthesia    panic attacks before surgery   COPD (chronic obstructive pulmonary disease) ([place])    Depression    Diabetes mellitus without complication ([place])    GERD (gastroesophageal reflux disease)    Hypertension    Noninfectious gastroenteritis and colitis 01/10/2013   Past Surgical History:  Procedure Laterality Date   CESAREAN SECTION  Story N/A 02/20/2018   Procedure: DILATATION & CURETTAGE/HYSTEROSCOPY WITH MYOSURE ENDOMETRIAL POLYPECTOMY;  Surgeon: Will Bonnet, MD;  Location: [facility];  Service: Gynecology;  Laterality: N/A;   ESOPHAGOGASTRODUODENOSCOPY (EGD) WITH PROPOFOL N/A 01/01/2019   Procedure: ESOPHAGOGASTRODUODENOSCOPY (EGD) WITH PROPOFOL;  Surgeon: Lin Landsman, MD;  Location: [place];  Service: Gastroenterology;  Laterality: N/A;   FOOT CAPSULE RELEASE W/ PERCUTANEOUS HEEL CORD LENGTHENING, TIBIAL TENDON TRANSFER     age 50 ,and 2010-both legs   JOINT REPLACEMENT Right    both knees partial  replacements dates unknown   knee replacment     x 5 per pt. last one- left knee 2014. has had 2 on R 3 on L   TYMPANOPLASTY WITH GRAFT Right 01/08/2018   Procedure: TYMPANOPLASTY WITH POSSIBLE OSSICULAR CHAIN RECONSTRUCTION;  Surgeon: [name], MD;  Location: [facility];  Service: ENT;  Laterality: Right;   Patient Active Problem List   Diagnosis Date Noted   Medication reaction 08/29/2021   No-show for appointment 12/22/2020   Obesity due to excess calories 09/22/2020   Nausea 09/20/2020   Sinusitis 08/09/2020   Insomnia due to medical condition 05/11/2020   COPD (chronic obstructive pulmonary disease) ([place]) 03/22/2020   Hamstring tendonitis 01/29/2020   MDD (major depressive disorder), recurrent, in full remission ([place]) 08/12/2019   Seizure-like activity ([place]) 07/08/2019   Mild episode of recurrent major depressive disorder ([place]) 12/29/2018   Insomnia due to mental condition 12/29/2018   Disorder of vein 03/04/2018   Lumbar sprain 03/04/2018   Muscle weakness 03/04/2018   Neck sprain 03/04/2018   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   Postmenopausal bleeding 02/18/2018   Impingement syndrome of shoulder region 02/04/2018   Chest pain with moderate risk for cardiac etiology 05/19/2017   History of depression 04/15/2017   GAD (generalized anxiety disorder) 04/15/2017   Chronic daily headache 04/15/2017   Panic attack 04/15/2017   Nocturnal enuresis 04/15/2017   Mild  cognitive impairment 04/15/2017   Loss of memory 01/14/2017   Pain medication agreement signed 01/08/2017   Headache disorder 12/04/2016   Chronic, continuous use of opioids 07/01/2015   Vertigo 07/01/2015   Chronic midline low back pain without sciatica 03/21/2015   Acute renal failure (ARF) ([place]) 02/19/2015   Type 2 diabetes mellitus without complication ([place]) [id number]   Cerebral palsy ([place]) 01/05/2014   Cerebral palsy with spastic/ataxic diplegia 01/05/2014   Hip pain, chronic 04/28/2013   Essential  hypertension 01/10/2013   Irritable colon 01/10/2013   Encounter for long-term (current) use of other medications 12/04/2012   Chronic GERD 08/12/2012   Epigastric pain 08/12/2012   Diarrhea 08/12/2012   Primary localized osteoarthrosis, lower leg 05/07/2012   Difficulty walking 02/06/2012    REFERRING DIAG: G80.1 (ICD-10-CM) - 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M79.604 (ICD-10-CM) - Pain in right leg M79.605 (ICD-10-CM) - Pain in left leg   THERAPY DIAG:  Difficulty in walking, not elsewhere classified  Chronic pain of right knee  Muscle weakness (generalized)  Other low back pain  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(HCC)  Rationale for Evaluation and Treatment Rehabilitation  PERTINENT HISTORY: Pain in R and L legs, spastic diplegic cerebral palsy. Hurt her R mid back, feels like she pulled a muscle, making it difficult for her to reach across. Feels like someone is grinding their knuckles on her R mid back, feels like she pulled a muscle. R knee and knee cap has been bothering her. L LE has been dragging worse than last time. Takes fluid pills for swelling in her legs. No L LE pain. Recently got a new KAFO and finally broke them in (was way too tight when pt first put it on. Both legs feel weak. Has a knee brace for her R knee cap to help keep it from collapsing. Got trigger point injections for her neck and back this month (November 2023). L LE feels weak.   PRECAUTIONS: fall risk  SUBJECTIVE:     SUBJECTIVE STATEMENT: R lateral knee keeps buckling, feels better when using Voltaren gel 3-4x/day. No pain in R knee. No R mid back pain currently. Just tender to touch it. Trunk muscles are not as sore. R wrist hurts.     PAIN:  Are you having pain? See subjective      TODAY'S TREATMENT:                                                                                                                                         DATE: 06/11/2022  [place]x Allergies  Therapeutic  exercise  Sitting with upright posture   Gentle PT manual trunk perturbation all planes 1 minute x 3   Difficulty performing   B scapular retraction 5x5 seconds, then 30 seconds, then 10x5 seconds    Max tactile cues for proper scapular mechanics R > L decreasing shoulder shrug   To promote  thoracic extension to decrease stress to low back.    Gait with platform walker for R wrist 5 ft. Unsafe secondary to tendency to tip over to the R    Seated L knee extension with PT assist to end range 10x5 seconds for 3 sets  Seated R hip ER 10x5 seconds for 3 sets   Gait from treatment room to car                 Cues for L hip circumduction, placing body weight onto L LE with B UE assist (L UE > R UE to decrease pain in R wrist and hand) from rw to promote R knee flexion in neutral for swing phase, and neutral knee for foot flat and push-off to decrease R hip IR and R knee genu valgus, and therefore decreasing stress to R knee.    Improved exercise technique, movement at target joints, use of target muscles after mod verbal, visual, tactile cues.      Response to treatment Pt tolerated session well without aggravation of symptoms.          Clinical impression  Worked on utilizing platform walker attachment on R side to decrease pressure to R wrist. Unsafe secondary to R lateral lean, resulting in rw tipping over to the R. Worked on gait (with L knee locked in extension in brace) with increasing L hip circumduction and adduction and L LE weight shift during swing to foot flat phase and increase L UE assist on rw to decrease pressure to R wrist. Improved R wrist comfort but difficult for pt to maintain movement pattern secondary to fatigue and not being the motor pattern her body is used to. Continued working on trunk strength, posture, L knee extension and R hip strength to decrease stress to mid to low back, R knee, as well as to decrease difficulty with gait.  Pt tolerated session  well without aggravation of symptoms. Pt will benefit from continued skilled physical therapy services to decrease pain, improve strength and function.        PATIENT EDUCATION: Education details: HEP, there-ex Person educated: Patient Education method: Explanation, Demonstration, Tactile cues, Verbal cues, and Handouts Education comprehension: verbalized understanding and returned demonstration  HOME EXERCISE PROGRAM: Access Code: [email or web address] URL: [email or web address] Date: 03/21/2022 Prepared by: Joneen Boers  Exercises - Seated Hip Abduction with Resistance  - 1 x daily - 7 x weekly - 2 sets - 10 reps  Red band  - Seated Hip Adduction Isometrics with Ball  - 1 x daily - 7 x weekly - 3 sets - 10 reps - 5 seconds hold  - Standing Hip Abduction with Counter Support  - 1 x daily - 7 x weekly - 2-3 sets - 10 reps  Reclined   Hooklying   Hip extension isometrics    L 10x5 seconds for 3 sets     R 10x5 seconds for 3 sets  - Supine Posterior Pelvic Tilt  - 1 x daily - 7 x weekly - 3 sets - 10 reps - 5 seconds hold - Seated Long Arc Quad with Strap  - 2 x daily - 4 x weekly - 4 sets - 5 reps - 5 seconds hold       PT Short Term Goals - 05/16/22 1742       PT SHORT TERM GOAL #1   Title Pt will be independent with her initial HEP to decrease R thigh, knee and back pain, improve B LE strength  and ability to ambulate with less difficulty.    Baseline No questions with her initial HEP (05/16/2022)    Time 3    Period Weeks    Status Achieved    Target Date 04/05/22              PT Long Term Goals - 05/16/22 1510       PT LONG TERM GOAL #1   Title Pt will have a decrease in R knee pain to 4/10 or less at worst to promote ability to ambulate with less difficulty.    Baseline R knee pain 9/10 at worst for the past 3 months (03/13/2022); R knee overall feels better compared to prior to starting PT, 7/10 R lateral femoral condyle at most for the past 7 days  (05/16/2022)    Time 6    Period Weeks    Status Partially Met    Target Date 06/28/22      PT LONG TERM GOAL #2   Title Pt will have a decrease in mid back pain to 4/10 or less at worst to promote ability to ambulate, perform standing tasks with less difficulty.    Baseline 10/10 mid back pain at worst for the past 3-4 days (03/13/2022); 8/10 at worst for the past 2 weeks, the exercises help (05/16/2022)    Time 6    Period Weeks    Status Partially Met    Target Date 06/28/22      PT LONG TERM GOAL #3   Title Pt will improve B hip flexion, extension, abduction, and knee extension strength by at least 1/2 MMT grade to promote ability to ambulate with less difficulty.    Baseline Hip flexion 4-/5 R, 3/5 L, hip extension 4-/5 R and L, hip abduction 4-/5 R, 3+/5 L, knee extension 4+/5 R, 3-/5 L (03/13/2022); seated manually resisted: hip flexion 4-/5 R, 4-/5 L, hip extension 4/5 R and L, hip abduction 4-/5 R, 3+/5 L, knee extension 4+/5 R, 3-/5 L (05/16/2022)    Time 6    Period Weeks    Status Partially Met    Target Date 06/28/22      PT LONG TERM GOAL #4   Title Pt will improve her FOTO score by at least 10 points as a demonstration of improved function.    Baseline Initial FOTO score not yet obtained. (03/13/2022); Upper leg FOTO 39 (03/21/2022); Upper leg FOTO 35. Pt however states legs feel better since starting PT (05/16/2022)    Time 6    Period Weeks    Status On-going    Target Date 06/28/22              Plan - 06/11/22 1458     Clinical Impression Statement Worked on utilizing platform walker attachment on R side to decrease pressure to R wrist. Unsafe secondary to R lateral lean, resulting in rw tipping over to the R. Worked on gait (with L knee locked in extension in brace) with increasing L hip circumduction and adduction and L LE weight shift during swing to foot flat phase and increase L UE assist on rw to decrease pressure to R wrist. Improved R wrist comfort but  difficult for pt to maintain movement pattern secondary to fatigue and not being the motor pattern her body is used to. Continued working on trunk strength, posture, L knee extension and R hip strength to decrease stress to mid to low back, R knee, as well as to decrease difficulty with gait.  Pt tolerated session well without aggravation of symptoms. Pt will benefit from continued skilled physical therapy services to decrease pain, improve strength and function.    Personal Factors and Comorbidities Comorbidity 3+;Past/Current Experience;Time since onset of injury/illness/exacerbation;Fitness    Comorbidities Anxiety, arthritis, COPD, DM, depression, HTN    Examination-Activity Limitations Stairs;Lift;Caring for Others;Transfers;Carry;Locomotion Level;Squat    Stability/Clinical Decision Making Stable/Uncomplicated    Clinical Decision Making Low    Rehab Potential Fair    PT Frequency 2x / week    PT Duration 8 weeks    PT Treatment/Interventions Electrical Stimulation;Iontophoresis 73m/ml Dexamethasone;Gait training;Stair training;Functional mobility training;Therapeutic activities;Therapeutic exercise;Balance training;Neuromuscular re-education;Patient/family education;Manual techniques;Dry needling    PT Next Visit Plan FU on thoracic extension intervention and myofascial release to Rt periscapular zone    PT Home Exercise Plan no changes to program this visit; discussed trial of throacic towel roll if desired, but prefer to try this here first a few times before adding to home program    Consulted and Agree with Plan of Care Patient                 MJoneen BoersPT, DPT  06/11/2022, 4:11 PM

## 2022-06-13 ENCOUNTER — Ambulatory Visit: Payer: 59

## 2022-06-13 DIAGNOSIS — G8929 Other chronic pain: Secondary | ICD-10-CM

## 2022-06-13 DIAGNOSIS — R262 Difficulty in walking, not elsewhere classified: Secondary | ICD-10-CM | POA: Diagnosis not present

## 2022-06-13 DIAGNOSIS — G801 Spastic diplegic cerebral palsy: Secondary | ICD-10-CM

## 2022-06-13 DIAGNOSIS — M5459 Other low back pain: Secondary | ICD-10-CM

## 2022-06-13 DIAGNOSIS — M6281 Muscle weakness (generalized): Secondary | ICD-10-CM

## 2022-06-13 NOTE — Therapy (Signed)
OUTPATIENT PHYSICAL THERAPY TREATMENT NOTE    Patient Name: Traci Davis MRN: [id number] DOB:18-Jan-1971, 52 y.o., female Today's Date: 06/13/2022  PCP: Konrad Saha, MD  REFERRING PROVIDER: Leretha Dykes, DO    END OF SESSION:  PT End of Session - 06/13/22 1506     Visit Number 15    Number of Visits 29    Date for PT Re-Evaluation 06/28/22    Authorization Type [employer] Medicare    Authorization Time Period 03/13/22-05/10/22    Progress Note Due on Visit 10    PT Start Time 1506    PT Stop Time 1544    PT Time Calculation (min) 38 min    Activity Tolerance Patient tolerated treatment well;No increased pain    Behavior During Therapy [facility] for tasks assessed/performed                         Past Medical History:  Diagnosis Date   Acute pancreatitis 09/04/2014   Anxiety    Arthritis    Asthma    Cerebral palsy (HCC)    Complication of anesthesia    panic attacks before surgery   COPD (chronic obstructive pulmonary disease) ([address])    Depression    Diabetes mellitus without complication ([place])    GERD (gastroesophageal reflux disease)    Hypertension    Noninfectious gastroenteritis and colitis 01/10/2013   Past Surgical History:  Procedure Laterality Date   CESAREAN SECTION  [place] N/A 02/20/2018   Procedure: DILATATION & CURETTAGE/HYSTEROSCOPY WITH MYOSURE ENDOMETRIAL POLYPECTOMY;  Surgeon: Will Bonnet, MD;  Location: [facility];  Service: Gynecology;  Laterality: N/A;   ESOPHAGOGASTRODUODENOSCOPY (EGD) WITH PROPOFOL N/A 01/01/2019   Procedure: ESOPHAGOGASTRODUODENOSCOPY (EGD) WITH PROPOFOL;  Surgeon: Lin Landsman, MD;  Location: [place];  Service: Gastroenterology;  Laterality: N/A;   FOOT CAPSULE RELEASE W/ PERCUTANEOUS HEEL CORD LENGTHENING, TIBIAL TENDON TRANSFER     age 65 ,and 2010-both legs   JOINT REPLACEMENT Right    both knees partial  replacements dates unknown   knee replacment     x 5 per pt. last one- left knee 2014. has had 2 on R 3 on L   TYMPANOPLASTY WITH GRAFT Right 01/08/2018   Procedure: TYMPANOPLASTY WITH POSSIBLE OSSICULAR CHAIN RECONSTRUCTION;  Surgeon: [name], MD;  Location: [facility];  Service: ENT;  Laterality: Right;   Patient Active Problem List   Diagnosis Date Noted   Medication reaction 08/29/2021   No-show for appointment 12/22/2020   Obesity due to excess calories 09/22/2020   Nausea 09/20/2020   Sinusitis 08/09/2020   Insomnia due to medical condition 05/11/2020   COPD (chronic obstructive pulmonary disease) ([place]) 03/22/2020   Hamstring tendonitis 01/29/2020   MDD (major depressive disorder), recurrent, in full remission ([place]) 08/12/2019   Seizure-like activity ([place]) 07/08/2019   Mild episode of recurrent major depressive disorder ([place]) 12/29/2018   Insomnia due to mental condition 12/29/2018   Disorder of vein 03/04/2018   Lumbar sprain 03/04/2018   Muscle weakness 03/04/2018   Neck sprain 03/04/2018   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   Postmenopausal bleeding 02/18/2018   Impingement syndrome of shoulder region 02/04/2018   Chest pain with moderate risk for cardiac etiology 05/19/2017   History of depression 04/15/2017   GAD (generalized anxiety disorder) 04/15/2017   Chronic daily headache 04/15/2017   Panic attack 04/15/2017   Nocturnal enuresis 04/15/2017  Mild cognitive impairment 04/15/2017   Loss of memory 01/14/2017   Pain medication agreement signed 01/08/2017   Headache disorder 12/04/2016   Chronic, continuous use of opioids 07/01/2015   Vertigo 07/01/2015   Chronic midline low back pain without sciatica 03/21/2015   Acute renal failure (ARF) ([place]) 02/19/2015   Type 2 diabetes mellitus without complication ([place]) [id number]   Cerebral palsy ([place]) 01/05/2014   Cerebral palsy with spastic/ataxic diplegia 01/05/2014   Hip pain, chronic 04/28/2013   Essential  hypertension 01/10/2013   Irritable colon 01/10/2013   Encounter for long-term (current) use of other medications 12/04/2012   Chronic GERD 08/12/2012   Epigastric pain 08/12/2012   Diarrhea 08/12/2012   Primary localized osteoarthrosis, lower leg 05/07/2012   Difficulty walking 02/06/2012    REFERRING DIAG: G80.1 (ICD-10-CM) - 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M79.604 (ICD-10-CM) - Pain in right leg M79.605 (ICD-10-CM) - Pain in left leg   THERAPY DIAG:  Difficulty in walking, not elsewhere classified  Chronic pain of right knee  Muscle weakness (generalized)  Other low back pain  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(HCC)  Rationale for Evaluation and Treatment Rehabilitation  PERTINENT HISTORY: Pain in R and L legs, spastic diplegic cerebral palsy. Hurt her R mid back, feels like she pulled a muscle, making it difficult for her to reach across. Feels like someone is grinding their knuckles on her R mid back, feels like she pulled a muscle. R knee and knee cap has been bothering her. L LE has been dragging worse than last time. Takes fluid pills for swelling in her legs. No L LE pain. Recently got a new KAFO and finally broke them in (was way too tight when pt first put it on. Both legs feel weak. Has a knee brace for her R knee cap to help keep it from collapsing. Got trigger point injections for her neck and back this month (November 2023). L LE feels weak.   PRECAUTIONS: fall risk  SUBJECTIVE:     SUBJECTIVE STATEMENT: R is fine. R anterior shoulder and arm are bothering her. Feels sore. Mid back feels ok, no pain. 5/10 R knee 7/10 mid back pain at most for the past seven days.     PAIN:  Are you having pain? See subjective. No R knee or back pain currently.      TODAY'S TREATMENT:                                                                                                                                         DATE: 06/13/2022  Latex Allergies  Therapeutic  exercise  Sitting with upright posture   B scapular retraction 10x5 seconds,    then with yellow band 10x5 seconds for 2 sets   To promote thoracic extension   Gentle PT manual trunk perturbation all planes 1 minute x 3    Seated L knee extension with  PT assist to end range 10x5 seconds for 3 sets  Seated R hip ER 10x5 seconds for 2 sets  Sit <> stand from elevated mat table with B UE assist to stand, no UE assist to sit  5x (21 inch high table)   Gait from treatment room to outside                 Cues for L hip circumduction, placing body weight onto L LE with B UE assist (L UE > R UE to decrease pain in R wrist and hand) from rw to promote R knee flexion in neutral for swing phase, and neutral knee for foot flat and push-off to decrease R hip IR and R knee genu valgus, and therefore decreasing stress to R knee.    Improved exercise technique, movement at target joints, use of target muscles after mod verbal, visual, tactile cues.      Response to treatment Pt tolerated session well without aggravation of symptoms.          Clinical impression  Improving R knee and mid back pain based on subjective reports. Continued working on gait, trunk strength, posture, L knee extension and R hip strength to decrease stress to mid to low back, R knee, as well as to decrease difficulty with ambulation.  Pt tolerated session well without aggravation of symptoms. Pt will benefit from continued skilled physical therapy services to decrease pain, improve strength and function.        PATIENT EDUCATION: Education details: HEP, there-ex Person educated: Patient Education method: Explanation, Demonstration, Tactile cues, Verbal cues, and Handouts Education comprehension: verbalized understanding and returned demonstration  HOME EXERCISE PROGRAM: Access Code: [email or web address] URL: [email or web address] Date: 03/21/2022 Prepared by: Joneen Boers  Exercises - Seated Hip  Abduction with Resistance  - 1 x daily - 7 x weekly - 2 sets - 10 reps  Red band  - Seated Hip Adduction Isometrics with Ball  - 1 x daily - 7 x weekly - 3 sets - 10 reps - 5 seconds hold  - Standing Hip Abduction with Counter Support  - 1 x daily - 7 x weekly - 2-3 sets - 10 reps  Reclined   Hooklying   Hip extension isometrics    L 10x5 seconds for 3 sets     R 10x5 seconds for 3 sets  - Supine Posterior Pelvic Tilt  - 1 x daily - 7 x weekly - 3 sets - 10 reps - 5 seconds hold - Seated Long Arc Quad with Strap  - 2 x daily - 4 x weekly - 4 sets - 5 reps - 5 seconds hold       PT Short Term Goals - 05/16/22 1742       PT SHORT TERM GOAL #1   Title Pt will be independent with her initial HEP to decrease R thigh, knee and back pain, improve B LE strength and ability to ambulate with less difficulty.    Baseline No questions with her initial HEP (05/16/2022)    Time 3    Period Weeks    Status Achieved    Target Date 04/05/22              PT Long Term Goals - 06/13/22 1853       PT LONG TERM GOAL #1   Title Pt will have a decrease in R knee pain to 4/10 or less at worst to promote ability to ambulate with less difficulty.  Baseline R knee pain 9/10 at worst for the past 3 months (03/13/2022); R knee overall feels better compared to prior to starting PT, 7/10 R lateral femoral condyle at most for the past 7 days (05/16/2022); 5/10 R knee pain at worst for the past 7 days (06/13/2022)    Time 6    Period Weeks    Status Partially Met    Target Date 06/28/22      PT LONG TERM GOAL #2   Title Pt will have a decrease in mid back pain to 4/10 or less at worst to promote ability to ambulate, perform standing tasks with less difficulty.    Baseline 10/10 mid back pain at worst for the past 3-4 days (03/13/2022); 8/10 at worst for the past 2 weeks, the exercises help (05/16/2022); 7/10 mid back pain at worst for the past 7 days (06/13/2022)    Time 6    Period Weeks    Status  Partially Met    Target Date 06/28/22      PT LONG TERM GOAL #3   Title Pt will improve B hip flexion, extension, abduction, and knee extension strength by at least 1/2 MMT grade to promote ability to ambulate with less difficulty.    Baseline Hip flexion 4-/5 R, 3/5 L, hip extension 4-/5 R and L, hip abduction 4-/5 R, 3+/5 L, knee extension 4+/5 R, 3-/5 L (03/13/2022); seated manually resisted: hip flexion 4-/5 R, 4-/5 L, hip extension 4/5 R and L, hip abduction 4-/5 R, 3+/5 L, knee extension 4+/5 R, 3-/5 L (05/16/2022)    Time 6    Period Weeks    Status Partially Met    Target Date 06/28/22      PT LONG TERM GOAL #4   Title Pt will improve her FOTO score by at least 10 points as a demonstration of improved function.    Baseline Initial FOTO score not yet obtained. (03/13/2022); Upper leg FOTO 39 (03/21/2022); Upper leg FOTO 35. Pt however states legs feel better since starting PT (05/16/2022)    Time 6    Period Weeks    Status On-going    Target Date 06/28/22              Plan - 06/13/22 1506     Clinical Impression Statement Improving R knee and mid back pain based on subjective reports. Continued working on gait, trunk strength, posture, L knee extension and R hip strength to decrease stress to mid to low back, R knee, as well as to decrease difficulty with ambulation.  Pt tolerated session well without aggravation of symptoms. Pt will benefit from continued skilled physical therapy services to decrease pain, improve strength and function.    Personal Factors and Comorbidities Comorbidity 3+;Past/Current Experience;Time since onset of injury/illness/exacerbation;Fitness    Comorbidities Anxiety, arthritis, COPD, DM, depression, HTN    Examination-Activity Limitations Stairs;Lift;Caring for Others;Transfers;Carry;Locomotion Level;Squat    Stability/Clinical Decision Making Stable/Uncomplicated    Rehab Potential Fair    PT Frequency 2x / week    PT Duration 8 weeks    PT  Treatment/Interventions Electrical Stimulation;Iontophoresis 79m/ml Dexamethasone;Gait training;Stair training;Functional mobility training;Therapeutic activities;Therapeutic exercise;Balance training;Neuromuscular re-education;Patient/family education;Manual techniques;Dry needling    PT Next Visit Plan FU on thoracic extension intervention and myofascial release to Rt periscapular zone    PT Home Exercise Plan no changes to program this visit; discussed trial of throacic towel roll if desired, but prefer to try this here first a few times before adding to home  program    Consulted and Agree with Plan of Care Patient                  Joneen Boers PT, DPT  06/13/2022, 6:55 PM

## 2022-06-19 ENCOUNTER — Ambulatory Visit: Payer: 59 | Admitting: Occupational Therapy

## 2022-06-19 ENCOUNTER — Encounter: Payer: Self-pay | Admitting: Occupational Therapy

## 2022-06-19 ENCOUNTER — Ambulatory Visit: Payer: 59

## 2022-06-19 DIAGNOSIS — G5603 Carpal tunnel syndrome, bilateral upper limbs: Secondary | ICD-10-CM

## 2022-06-19 DIAGNOSIS — M654 Radial styloid tenosynovitis [de Quervain]: Secondary | ICD-10-CM

## 2022-06-19 DIAGNOSIS — G8929 Other chronic pain: Secondary | ICD-10-CM

## 2022-06-19 DIAGNOSIS — R262 Difficulty in walking, not elsewhere classified: Secondary | ICD-10-CM

## 2022-06-19 DIAGNOSIS — M6281 Muscle weakness (generalized): Secondary | ICD-10-CM

## 2022-06-19 DIAGNOSIS — M5459 Other low back pain: Secondary | ICD-10-CM

## 2022-06-19 DIAGNOSIS — G801 Spastic diplegic cerebral palsy: Secondary | ICD-10-CM

## 2022-06-19 DIAGNOSIS — M25531 Pain in right wrist: Secondary | ICD-10-CM

## 2022-06-19 NOTE — Therapy (Signed)
OUTPATIENT PHYSICAL THERAPY TREATMENT NOTE    Patient Name: Traci Davis MRN: [id number] DOB:02/02/1971, 52 y.o., female Today's Date: 06/19/2022  PCP: Konrad Saha, MD  REFERRING PROVIDER: Leretha Dykes, DO    END OF SESSION:  PT End of Session - 06/19/22 1558     Visit Number 16    Number of Visits 29    Date for PT Re-Evaluation 06/28/22    Authorization Type UHC Medicare    Authorization Time Period 03/13/22-05/10/22    Progress Note Due on Visit 10    PT Start Time [id number]    PT Stop Time 1638    PT Time Calculation (min) 45 min    Activity Tolerance Patient tolerated treatment well;No increased pain    Behavior During Therapy [facility] for tasks assessed/performed                          Past Medical History:  Diagnosis Date   Acute pancreatitis 09/04/2014   Anxiety    Arthritis    Asthma    Cerebral palsy (HCC)    Complication of anesthesia    panic attacks before surgery   COPD (chronic obstructive pulmonary disease) ([place])    Depression    Diabetes mellitus without complication ([place])    GERD (gastroesophageal reflux disease)    Hypertension    Noninfectious gastroenteritis and colitis 01/10/2013   Past Surgical History:  Procedure Laterality Date   CESAREAN SECTION  [place] N/A 02/20/2018   Procedure: DILATATION & CURETTAGE/HYSTEROSCOPY WITH MYOSURE ENDOMETRIAL POLYPECTOMY;  Surgeon: Will Bonnet, MD;  Location: [facility];  Service: Gynecology;  Laterality: N/A;   ESOPHAGOGASTRODUODENOSCOPY (EGD) WITH PROPOFOL N/A 01/01/2019   Procedure: ESOPHAGOGASTRODUODENOSCOPY (EGD) WITH PROPOFOL;  Surgeon: Lin Landsman, MD;  Location: [place];  Service: Gastroenterology;  Laterality: N/A;   FOOT CAPSULE RELEASE W/ PERCUTANEOUS HEEL CORD LENGTHENING, TIBIAL TENDON TRANSFER     age 58 ,and 2010-both legs   JOINT REPLACEMENT Right    both knees partial  replacements dates unknown   knee replacment     x 5 per pt. last one- left knee 2014. has had 2 on R 3 on L   TYMPANOPLASTY WITH GRAFT Right 01/08/2018   Procedure: TYMPANOPLASTY WITH POSSIBLE OSSICULAR CHAIN RECONSTRUCTION;  Surgeon: [name], MD;  Location: [facility];  Service: ENT;  Laterality: Right;   Patient Active Problem List   Diagnosis Date Noted   Medication reaction 08/29/2021   No-show for appointment 12/22/2020   Obesity due to excess calories 09/22/2020   Nausea 09/20/2020   Sinusitis 08/09/2020   Insomnia due to medical condition 05/11/2020   COPD (chronic obstructive pulmonary disease) ([place]) 03/22/2020   Hamstring tendonitis 01/29/2020   MDD (major depressive disorder), recurrent, in full remission ([place]) 08/12/2019   Seizure-like activity ([place]) 07/08/2019   Mild episode of recurrent major depressive disorder ([place]) 12/29/2018   Insomnia due to mental condition 12/29/2018   Disorder of vein 03/04/2018   Lumbar sprain 03/04/2018   Muscle weakness 03/04/2018   Neck sprain 03/04/2018   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   Postmenopausal bleeding 02/18/2018   Impingement syndrome of shoulder region 02/04/2018   Chest pain with moderate risk for cardiac etiology 05/19/2017   History of depression 04/15/2017   GAD (generalized anxiety disorder) 04/15/2017   Chronic daily headache 04/15/2017   Panic attack 04/15/2017   Nocturnal enuresis 04/15/2017  Mild cognitive impairment 04/15/2017   Loss of memory 01/14/2017   Pain medication agreement signed 01/08/2017   Headache disorder 12/04/2016   Chronic, continuous use of opioids 07/01/2015   Vertigo 07/01/2015   Chronic midline low back pain without sciatica 03/21/2015   Acute renal failure (ARF) ([place]) 02/19/2015   Type 2 diabetes mellitus without complication ([place]) [id number]   Cerebral palsy ([place]) 01/05/2014   Cerebral palsy with spastic/ataxic diplegia 01/05/2014   Hip pain, chronic 04/28/2013   Essential  hypertension 01/10/2013   Irritable colon 01/10/2013   Encounter for long-term (current) use of other medications 12/04/2012   Chronic GERD 08/12/2012   Epigastric pain 08/12/2012   Diarrhea 08/12/2012   Primary localized osteoarthrosis, lower leg 05/07/2012   Difficulty walking 02/06/2012    REFERRING DIAG: G80.1 (ICD-10-CM) - 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M79.604 (ICD-10-CM) - Pain in right leg M79.605 (ICD-10-CM) - Pain in left leg   THERAPY DIAG:  Difficulty in walking, not elsewhere classified  Chronic pain of right knee  Muscle weakness (generalized)  Other low back pain  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(HCC)  Rationale for Evaluation and Treatment Rehabilitation  PERTINENT HISTORY: Pain in R and L legs, spastic diplegic cerebral palsy. Hurt her R mid back, feels like she pulled a muscle, making it difficult for her to reach across. Feels like someone is grinding their knuckles on her R mid back, feels like she pulled a muscle. R knee and knee cap has been bothering her. L LE has been dragging worse than last time. Takes fluid pills for swelling in her legs. No L LE pain. Recently got a new KAFO and finally broke them in (was way too tight when pt first put it on. Both legs feel weak. Has a knee brace for her R knee cap to help keep it from collapsing. Got trigger point injections for her neck and back this month (November 2023). L LE feels weak.   PRECAUTIONS: fall risk  SUBJECTIVE:     SUBJECTIVE STATEMENT: R knee is not bothering her. R mid back hurts a little bit but not as bad as it was.    PAIN:  Are you having pain? See subjective.      TODAY'S TREATMENT:                                                                                                                                         DATE: 06/19/2022  Latex Allergies  Therapeutic exercise  Sitting with upright posture   B scapular retraction 10x5 seconds,    then with yellow band 10x5 seconds for  2 sets, then 7x5 seconds   To promote thoracic extension   Gentle PT manual trunk perturbation all planes 1 minute x 3  Seated R hip ER 10x5 seconds for 2 sets  Seated L knee extension with assist to end range 10x5 seconds for 3 sets  Gait from treatment room to outside                 Cues for L hip circumduction, placing body weight onto L LE with B UE assist (L UE > R UE to decrease pain in R wrist and hand) from rw to promote R knee flexion in neutral for swing phase, and neutral knee for foot flat and push-off to decrease R hip IR and R knee genu valgus, and therefore decreasing stress to R knee.    Improved exercise technique, movement at target joints, use of target muscles after mod verbal, visual, tactile cues.      Response to treatment Pt tolerated session well without aggravation of symptoms.          Clinical impression  Continued working on gait, trunk strength, posture, L knee extension and R hip strength to decrease stress to mid to low back, R knee, as well as to decrease difficulty with ambulation.  Pt tolerated session well without aggravation of symptoms. Pt will benefit from continued skilled physical therapy services to decrease pain, improve strength and function.        PATIENT EDUCATION: Education details: HEP, there-ex Person educated: Patient Education method: Explanation, Demonstration, Tactile cues, Verbal cues, and Handouts Education comprehension: verbalized understanding and returned demonstration  HOME EXERCISE PROGRAM: Access Code: [email or web address] URL: [email or web address] Date: 03/21/2022 Prepared by: Joneen Boers  Exercises - Seated Hip Abduction with Resistance  - 1 x daily - 7 x weekly - 2 sets - 10 reps  Red band  - Seated Hip Adduction Isometrics with Ball  - 1 x daily - 7 x weekly - 3 sets - 10 reps - 5 seconds hold  - Standing Hip Abduction with Counter Support  - 1 x daily - 7 x weekly - 2-3 sets - 10  reps  Reclined   Hooklying   Hip extension isometrics    L 10x5 seconds for 3 sets     R 10x5 seconds for 3 sets  - Supine Posterior Pelvic Tilt  - 1 x daily - 7 x weekly - 3 sets - 10 reps - 5 seconds hold - Seated Long Arc Quad with Strap  - 2 x daily - 4 x weekly - 4 sets - 5 reps - 5 seconds hold       PT Short Term Goals - 05/16/22 1742       PT SHORT TERM GOAL #1   Title Pt will be independent with her initial HEP to decrease R thigh, knee and back pain, improve B LE strength and ability to ambulate with less difficulty.    Baseline No questions with her initial HEP (05/16/2022)    Time 3    Period Weeks    Status Achieved    Target Date 04/05/22              PT Long Term Goals - 06/13/22 1853       PT LONG TERM GOAL #1   Title Pt will have a decrease in R knee pain to 4/10 or less at worst to promote ability to ambulate with less difficulty.    Baseline R knee pain 9/10 at worst for the past 3 months (03/13/2022); R knee overall feels better compared to prior to starting PT, 7/10 R lateral femoral condyle at most for the past 7 days (05/16/2022); 5/10 R knee pain at worst for the past 7 days (06/13/2022)    Time 6  Period Weeks    Status Partially Met    Target Date 06/28/22      PT LONG TERM GOAL #2   Title Pt will have a decrease in mid back pain to 4/10 or less at worst to promote ability to ambulate, perform standing tasks with less difficulty.    Baseline 10/10 mid back pain at worst for the past 3-4 days (03/13/2022); 8/10 at worst for the past 2 weeks, the exercises help (05/16/2022); 7/10 mid back pain at worst for the past 7 days (06/13/2022)    Time 6    Period Weeks    Status Partially Met    Target Date 06/28/22      PT LONG TERM GOAL #3   Title Pt will improve B hip flexion, extension, abduction, and knee extension strength by at least 1/2 MMT grade to promote ability to ambulate with less difficulty.    Baseline Hip flexion 4-/5 R, 3/5 L, hip  extension 4-/5 R and L, hip abduction 4-/5 R, 3+/5 L, knee extension 4+/5 R, 3-/5 L (03/13/2022); seated manually resisted: hip flexion 4-/5 R, 4-/5 L, hip extension 4/5 R and L, hip abduction 4-/5 R, 3+/5 L, knee extension 4+/5 R, 3-/5 L (05/16/2022)    Time 6    Period Weeks    Status Partially Met    Target Date 06/28/22      PT LONG TERM GOAL #4   Title Pt will improve her FOTO score by at least 10 points as a demonstration of improved function.    Baseline Initial FOTO score not yet obtained. (03/13/2022); Upper leg FOTO 39 (03/21/2022); Upper leg FOTO 35. Pt however states legs feel better since starting PT (05/16/2022)    Time 6    Period Weeks    Status On-going    Target Date 06/28/22              Plan - 06/19/22 1553     Clinical Impression Statement Continued working on gait, trunk strength, posture, L knee extension and R hip strength to decrease stress to mid to low back, R knee, as well as to decrease difficulty with ambulation.  Pt tolerated session well without aggravation of symptoms. Pt will benefit from continued skilled physical therapy services to decrease pain, improve strength and function.    Personal Factors and Comorbidities Comorbidity 3+;Past/Current Experience;Time since onset of injury/illness/exacerbation;Fitness    Comorbidities Anxiety, arthritis, COPD, DM, depression, HTN    Examination-Activity Limitations Stairs;Lift;Caring for Others;Transfers;Carry;Locomotion Level;Squat    Stability/Clinical Decision Making Stable/Uncomplicated    Clinical Decision Making Low    Rehab Potential Fair    PT Frequency 2x / week    PT Duration 8 weeks    PT Treatment/Interventions Electrical Stimulation;Iontophoresis 89m/ml Dexamethasone;Gait training;Stair training;Functional mobility training;Therapeutic activities;Therapeutic exercise;Balance training;Neuromuscular re-education;Patient/family education;Manual techniques;Dry needling    PT Next Visit Plan FU on  thoracic extension intervention and myofascial release to Rt periscapular zone    PT Home Exercise Plan no changes to program this visit; discussed trial of throacic towel roll if desired, but prefer to try this here first a few times before adding to home program    Consulted and Agree with Plan of Care Patient                   MJoneen BoersPT, DPT  06/19/2022, 5:00 PM

## 2022-06-19 NOTE — Therapy (Signed)
[facility] [address] [place], [address] Phone: [phone]   Fax:  [phone]  Occupational Therapy Evaluation  Patient Details  Name: Traci Davis MRN: [id number] Date of Birth: Sep 01, 1970 Referring Provider (OT): Firthcliffe PA   Encounter Date: 06/19/2022    Past Medical History:  Diagnosis Date   Acute pancreatitis 09/04/2014   Anxiety    Arthritis    Asthma    Cerebral palsy ([place])    Complication of anesthesia    panic attacks before surgery   COPD (chronic obstructive pulmonary disease) ([place])    Depression    Diabetes mellitus without complication ([place])    GERD (gastroesophageal reflux disease)    Hypertension    Noninfectious gastroenteritis and colitis 01/10/2013    Past Surgical History:  Procedure Laterality Date   [place] N/A 02/20/2018   Procedure: DILATATION & CURETTAGE/HYSTEROSCOPY WITH MYOSURE ENDOMETRIAL POLYPECTOMY;  Surgeon: Will Bonnet, MD;  Location: [facility];  Service: Gynecology;  Laterality: N/A;   ESOPHAGOGASTRODUODENOSCOPY (EGD) WITH PROPOFOL N/A 01/01/2019   Procedure: ESOPHAGOGASTRODUODENOSCOPY (EGD) WITH PROPOFOL;  Surgeon: Lin Landsman, MD;  Location: [place];  Service: Gastroenterology;  Laterality: N/A;   FOOT CAPSULE RELEASE W/ PERCUTANEOUS HEEL CORD LENGTHENING, TIBIAL TENDON TRANSFER     age 52 ,and 2010-both legs   JOINT REPLACEMENT Right    both knees partial replacements dates unknown   knee replacment     x 5 per pt. last one- left knee 2014. has had 2 on R 3 on L   TYMPANOPLASTY WITH GRAFT Right 01/08/2018   Procedure: TYMPANOPLASTY WITH POSSIBLE OSSICULAR CHAIN RECONSTRUCTION;  Surgeon: [name], MD;  Location: [facility];  Service: ENT;  Laterality: Right;    There were no vitals filed for this visit.   Subjective Assessment - 06/19/22 1830      Subjective  I am using my hands a lot on the walker - and putting more weight thru the R one now to since using this brace - and trying to walk differently - and then locking and unlocking the braces- the one is really tight. Numbness mostly at night time - has splints    Pertinent History MD visit 06/12/22 -  Right wrist tendonitis and carpal tunnel syndrome: -- Referral to Occupational therapy at [facility] in [place] with Gwenette Greet to address the right wrist tendonitis and carpal tunnel syndrome. -- Wear your right wrist brace during the day especially when you are using your walker.    Patient Stated Goals Want the pain in my thumbs and hands better as well as the numbness    Currently in Pain? Yes    Pain Score 7     Pain Location --   1st dorsal compartment and dorsal thumb   Pain Orientation Right    Pain Descriptors / Indicators Aching;Tender;Throbbing    Pain Type Acute pain;Chronic pain    Pain Onset More than a month ago    Pain Frequency Intermittent   worse when walking and using braces a lot              [facility] OT Assessment - 06/19/22 0001       Assessment   Medical Diagnosis Bil CTS and 1st dorsal compartment tendinitis    Referring Provider (OT) Rouch PA    Onset Date/Surgical Date 05/29/22    Hand Dominance Right  Prior Therapy OT in 2016 CTS      Precautions   Precaution Comments fall risk      Home  Environment   Lives With Daughter      Prior Function   Vocation On disability      AROM   Overall AROM Comments Wrist AROM WNL , tight in webspace R > L , Tenderness              Patient report using new leg brace for about 3 years.  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  Dependent heavily on hands using her walker and leg brace on the left. Report having increased pain in the last 3 weeks in the right thumb and wrist. Upon assessment patient's latched on brace for locking and unlocking every time sit and stand is done patient loaded with hyperextension of right  hand. Latches stuck patient to use WD-40 in the past. Did attach some Coban adhesive to give increased traction.  Recommend for patient to use WD-40 again.  And try and use second and third digit to slide and pull latches. Assist patient in several prefab wrist and thumb splints but patient limited because of use of walker.  And being safe.  And decreasing fall risk. With all prefab's patient had still continued pain over dorsal thumb as well as first dorsal compartment. At the end patient on the best with thumb wrist Benik neoprene wrap with a custom Aquaplast piece fabricated and molded on dorsal thumb with a wraparound middle phalanges to prevent hyperextension and support thumb with activities. Patient educated in changing her grip on walker as well as knee brace. Patient to do contrast at home 3 times a day as well as ice massage over first dorsal compartment. Will assess patient's pain and progress in symptoms next visit. He did ask for iontopheresis with dexamethasone ordering plain if needed                OT Education - 06/19/22 1836     Education Details Findings of eval and HEP - splint wearing    Person(s) Educated Patient    Methods Explanation;Demonstration;Tactile cues;Verbal cues;Handout    Comprehension Verbal cues required;Returned demonstration;Verbalized understanding                 OT Long Term Goals - 06/19/22 1847       OT LONG TERM GOAL #1   Title Patient to be independent and splint wearing as well as modifications with grip using walker and unlocking and locking the brace to decrease pain to left to 4-10 first dorsal compartment    Baseline Patient using thumb radial abduction and hyperal extension of thumb on the walker as well as locking and unlocking leg brace several times during the day.  Pain 7/10 with positive Wynn Maudlin    Time 5    Period Weeks    Status New    Target Date 07/24/22      OT LONG TERM GOAL #2   Title Patient able  to use walker as well as managing leg brace without increase symptoms    Baseline Pain 7/10 with positive Wynn Maudlin.    Time 6    Period Weeks    Status New    Target Date 07/31/22                   Plan - 06/19/22 1838     Clinical Impression Statement Patient presented OT evaluation with a diagnosis of carpal tunnel as  well as tendinitis of the extensor tendon at the first dorsal compartment on the right.  Patient's pain and tenderness over first dorsal compartment 7/10.  With positive Finkelstein.  Patient's wrist active range of motion within normal limits with pain with ulnar deviation and wrist flexion extension endrange.  Patient with increased tightness in the thumb webspace but thumb palmar radial abduction within functional limits.  Patient uses walker with a knee brace on the left.  Heavily dependent on her hands on walker.  Diagnosis of the CP.  Patient do has bilateral brace that she use at nighttime.  Due to assessment for appropriate brace for thumb tendinitis and to be able still to use walker safely.  Patient was fitted with a Benik thumb neoprene wrist wrap with a custom fabriacated dorsal Aquaplast thumb piece  that is attatch to neoprene - and support thumb from loading and hyper extention with use of walker and locking/unlocking knee brace. Pt to wrap coban wrap around latch, try and use 2nd and 3rd digits , spray some WD40 to make it easier to slide latch. Pt to do contrast as well as ice massage over 1st dorsal compartment several times during day. Pt can benefit from skilled OT services to decrease tightnes in flexors of forearm and webspace -decrease pain and symptoms of CT - ed on modifying and joint protection , splint use and Iontophoresis with dexamethazone.    Occupational performance deficits (Please refer to evaluation for details): ADL's;Play;Leisure;Rest and Sleep;IADL's    Body Structure / Function / Physical Skills ADL;Decreased knowledge of use of DME;UE  functional use;Pain;Strength;IADL    Rehab Potential Fair    Clinical Decision Making Limited treatment options, no task modification necessary    Comorbidities Affecting Occupational Performance: May have comorbidities impacting occupational performance   CP depending heavily on leg brace and walker , CTS in 2016   Modification or Assistance to Complete Evaluation  No modification of tasks or assist necessary to complete eval    OT Frequency 2x / week    OT Duration 6 weeks    OT Treatment/Interventions Self-care/ADL training;Cryotherapy;Contrast Bath;Iontophoresis;Therapeutic exercise;DME and/or AE instruction;Manual Therapy;Splinting;Patient/family education    Consulted and Agree with Plan of Care Patient             Patient will benefit from skilled therapeutic intervention in order to improve the following deficits and impairments:   Body Structure / Function / Physical Skills: ADL, Decreased knowledge of use of DME, UE functional use, Pain, Strength, IADL       Visit Diagnosis: Carpal tunnel syndrome, bilateral upper limbs  Radial styloid tenosynovitis  Pain in right wrist    Problem List Patient Active Problem List   Diagnosis Date Noted   Medication reaction 08/29/2021   No-show for appointment 12/22/2020   Obesity due to excess calories 09/22/2020   Nausea 09/20/2020   Sinusitis 08/09/2020   Insomnia due to medical condition 05/11/2020   COPD (chronic obstructive pulmonary disease) ([place]) 03/22/2020   Hamstring tendonitis 01/29/2020   MDD (major depressive disorder), recurrent, in full remission ([place]) 08/12/2019   Seizure-like activity ([place]) 07/08/2019   Mild episode of recurrent major depressive disorder ([place]) 12/29/2018   Insomnia due to mental condition 12/29/2018   Disorder of vein 03/04/2018   Lumbar sprain 03/04/2018   Muscle weakness 03/04/2018   Neck sprain 03/04/2018   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   Postmenopausal bleeding 02/18/2018    Impingement syndrome of shoulder region 02/04/2018   Chest pain with moderate risk  for cardiac etiology 05/19/2017   History of depression 04/15/2017   GAD (generalized anxiety disorder) 04/15/2017   Chronic daily headache 04/15/2017   Panic attack 04/15/2017   Nocturnal enuresis 04/15/2017   Mild cognitive impairment 04/15/2017   Loss of memory 01/14/2017   Pain medication agreement signed 01/08/2017   Headache disorder 12/04/2016   Chronic, continuous use of opioids 07/01/2015   Vertigo 07/01/2015   Chronic midline low back pain without sciatica 03/21/2015   Acute renal failure (ARF) ([place]) 02/19/2015   Type 2 diabetes mellitus without complication ([place]) [id number]   Cerebral palsy ([place]) 01/05/2014   Cerebral palsy with spastic/ataxic diplegia 01/05/2014   Hip pain, chronic 04/28/2013   Essential hypertension 01/10/2013   Irritable colon 01/10/2013   Encounter for long-term (current) use of other medications 12/04/2012   Chronic GERD 08/12/2012   Epigastric pain 08/12/2012   Diarrhea 08/12/2012   Primary localized osteoarthrosis, lower leg 05/07/2012   Difficulty walking 02/06/2012    Rosalyn Gess, OTR/L,CLT 06/19/2022, 6:50 PM  [facility] [address] [place], [address] Phone: [phone]   Fax:  [phone]  Name: Traci Davis MRN: [id number] Date of Birth: 1970-08-15

## 2022-06-21 ENCOUNTER — Encounter: Payer: Self-pay | Admitting: Psychiatry

## 2022-06-21 ENCOUNTER — Telehealth (INDEPENDENT_AMBULATORY_CARE_PROVIDER_SITE_OTHER): Payer: 59 | Admitting: Psychiatry

## 2022-06-21 ENCOUNTER — Ambulatory Visit: Payer: 59

## 2022-06-21 DIAGNOSIS — G4701 Insomnia due to medical condition: Secondary | ICD-10-CM

## 2022-06-21 DIAGNOSIS — F331 Major depressive disorder, recurrent, moderate: Secondary | ICD-10-CM | POA: Diagnosis not present

## 2022-06-21 DIAGNOSIS — F411 Generalized anxiety disorder: Secondary | ICD-10-CM | POA: Diagnosis not present

## 2022-06-21 MED ORDER — FLUOXETINE HCL 40 MG PO CAPS: 40.0000 mg | ORAL_CAPSULE | Freq: Every day | ORAL | 0 refills | Status: DC

## 2022-06-21 MED ORDER — FLUOXETINE HCL 20 MG PO CAPS: 20.0000 mg | ORAL_CAPSULE | Freq: Every day | ORAL | 1 refills | Status: DC

## 2022-06-21 MED ORDER — LAMOTRIGINE 25 MG PO TABS: 25.0000 mg | ORAL_TABLET | Freq: Every day | ORAL | 0 refills | Status: DC

## 2022-06-21 NOTE — Progress Notes (Signed)
Virtual Visit via Video Note  I connected with Traci Davis on 06/21/22 at  2:30 PM EST by a video enabled telemedicine application and verified that I am speaking with the correct person using two identifiers.  Location Provider Location : [facility] Patient Location : Home  Participants: Patient , Provider, daughter   I discussed the limitations of evaluation and management by telemedicine and the availability of in person appointments. The patient expressed understanding and agreed to proceed.   I discussed the assessment and treatment plan with the patient. The patient was provided an opportunity to ask questions and all were answered. The patient agreed with the plan and demonstrated an understanding of the instructions.   The patient was advised to call back or seek an in-person evaluation if the symptoms worsen or if the condition fails to improve as anticipated.   [facility] MD OP Progress Note  06/22/2022 7:45 AM Traci Davis  MRN:  [id number]  Chief Complaint:  Chief Complaint  Patient presents with   Follow-up   Medication Refill   Anxiety   Depression   HPI: Traci Davis is a 52 year old Caucasian female, lives in [facility], has a history of MDD, GAD, insomnia,, cerebral palsy, diabetes mellitus, on SSD who was evaluated by telemedicine today.  Patient today reports her pain is more manageable.  The physical therapy has helped.  She continues to be in physical therapy twice a week and does exercises at home the other days.  Patient reports she was compliant on the Lamictal up until 2 days ago when she could not find her pill bottle.  She reports the Lamictal does help with her mood.  She does not have any significant irritability or mood swings at this time.  She denies side effects.  She would like to continue the Lamictal. (Of note patient during the session was able to find her pill bottle with the assistance of her daughter).  Patient reports she is compliant on the Prozac.   Denies side effects.  Reports sleep is overall improved.  Denies any suicidality, homicidality or perceptual disturbances.  Patient denies any other concerns today.  Visit Diagnosis:    ICD-10-CM   1. MDD (major depressive disorder), recurrent episode, moderate (HCC)  F33.1 lamoTRIgine (LAMICTAL) 25 MG tablet    FLUoxetine (PROZAC) 20 MG capsule    2. GAD (generalized anxiety disorder)  F41.1 lamoTRIgine (LAMICTAL) 25 MG tablet    FLUoxetine (PROZAC) 40 MG capsule    FLUoxetine (PROZAC) 20 MG capsule    3. Insomnia due to medical condition  G47.01    Pain, mood      Past Psychiatric History: Reviewed past psych history from progress note on 09/12/2017.  Past trials of Xanax, Klonopin, Cymbalta, Rozerem, Ambien, trazodone, doxepin.  Past Medical History:  Past Medical History:  Diagnosis Date   Acute pancreatitis 09/04/2014   Anxiety    Arthritis    Asthma    Cerebral palsy (HCC)    Complication of anesthesia    panic attacks before surgery   COPD (chronic obstructive pulmonary disease) ([place])    Depression    Diabetes mellitus without complication ([place])    GERD (gastroesophageal reflux disease)    Hypertension    Noninfectious gastroenteritis and colitis 01/10/2013    Past Surgical History:  Procedure Laterality Date   CESAREAN SECTION  1995   CHOLECYSTECTOMY     DILATATION & CURETTAGE/HYSTEROSCOPY WITH MYOSURE N/A 02/20/2018   Procedure: DILATATION & CURETTAGE/HYSTEROSCOPY WITH MYOSURE ENDOMETRIAL  POLYPECTOMY;  Surgeon: Will Bonnet, MD;  Location: [facility];  Service: Gynecology;  Laterality: N/A;   ESOPHAGOGASTRODUODENOSCOPY (EGD) WITH PROPOFOL N/A 01/01/2019   Procedure: ESOPHAGOGASTRODUODENOSCOPY (EGD) WITH PROPOFOL;  Surgeon: Lin Landsman, MD;  Location: [facility];  Service: Gastroenterology;  Laterality: N/A;   FOOT CAPSULE RELEASE W/ PERCUTANEOUS HEEL CORD LENGTHENING, TIBIAL TENDON TRANSFER     age 41 ,and 2010-both legs   JOINT REPLACEMENT  Right    both knees partial replacements dates unknown   knee replacment     x 5 per pt. last one- left knee 2014. has had 2 on R 3 on L   TYMPANOPLASTY WITH GRAFT Right 01/08/2018   Procedure: TYMPANOPLASTY WITH POSSIBLE OSSICULAR CHAIN RECONSTRUCTION;  Surgeon: [name], MD;  Location: [facility];  Service: ENT;  Laterality: Right;    Family Psychiatric History: Reviewed family psychiatric history from progress note on 09/12/2017.  Family History:  Family History  Problem Relation Age of Onset   CAD Father    Heart attack Brother    Sexual abuse Paternal Grandfather    Breast cancer Neg Hx     Social History: Reviewed social history from progress note on 09/12/2017. Social History   Socioeconomic History   Marital status: Single    Spouse name: Not on file   Number of children: 1   Years of education: Not on file   Highest education level: Associate degree: occupational, Hotel manager, or vocational program  Occupational History   Not on file  Tobacco Use   Smoking status: Never   Smokeless tobacco: Never  Vaping Use   Vaping Use: Never used  Substance and Sexual Activity   Alcohol use: No   Drug use: No   Sexual activity: Yes    Partners: Male    Birth control/protection: Condom  Other Topics Concern   Not on file  Social History Narrative   Not on file   Social Determinants of Health   Financial Resource Strain: Not on file  Food Insecurity: Not on file  Transportation Needs: Not on file  Physical Activity: Not on file  Stress: Not on file  Social Connections: Not on file    Allergies:  Allergies  Allergen Reactions   Meloxicam Other (See Comments)    Damage kidney   Morphine And Related Other (See Comments)    hallucinations   Vantin [Cefpodoxime] Nausea And Vomiting   Latex Itching   Baclofen Other (See Comments)    "makes cerebral palsy do adverse reaction on me", tightens muscles    Ciprofloxacin Itching and Nausea And Vomiting   Tape Rash     skin tears.  Paper tape is ok   Tramadol Nausea Only    Metabolic Disorder Labs: Lab Results  Component Value Date   HGBA1C 6.1 (H) 02/19/2015   No results found for: "PROLACTIN" No results found for: "CHOL", "TRIG", "HDL", "CHOLHDL", "VLDL", "LDLCALC" Lab Results  Component Value Date   TSH 2.198 07/10/2016   TSH 2.891 02/19/2015    Therapeutic Level Labs: No results found for: "LITHIUM" No results found for: "VALPROATE" No results found for: "CBMZ"  Current Medications: Current Outpatient Medications  Medication Sig Dispense Refill   Incontinence Supply Disposable (PREVAIL BLADDER CONTROL PAD) MISC Large incontinence pads to go into underwear     oxyCODONE ER 13.5 MG C12A Take by mouth.     oxyCODONE-acetaminophen (PERCOCET) 10-325 MG tablet Take by mouth.     acetaminophen (TYLENOL) 325 MG tablet Take 2 tablets (  650 mg total) by mouth every 6 (six) hours as needed. 60 tablet 0   albuterol (PROVENTIL HFA;VENTOLIN HFA) 108 (90 BASE) MCG/ACT inhaler Inhale 2 puffs into the lungs 4 (four) times daily as needed. For shortness of breath and/or wheezing     ANORO ELLIPTA 62.5-25 MCG/INH AEPB      atorvastatin (LIPITOR) 20 MG tablet Take 1 tablet by mouth daily.     azelastine (ASTELIN) 0.1 % nasal spray Place into both nostrils.     Blood Glucose Monitoring Suppl (GLUCOCOM BLOOD GLUCOSE MONITOR) DEVI Test daily before all meals/snacks and once before bedtime.     budesonide-formoterol (SYMBICORT) 80-4.5 MCG/ACT inhaler Inhale 2 puffs up to 4 times daily as needed for symptoms of wheezing or shortness of breath.     Continuous Blood Gluc Receiver (DEXCOM G6 RECEIVER) DEVI 1 Units by Miscellaneous route continuous.     Continuous Blood Gluc Transmit (DEXCOM G6 TRANSMITTER) MISC 1 Units by Miscellaneous route Every three (3) months.     CREON 36000 units CPEP capsule      cyclobenzaprine (FLEXERIL) 10 MG tablet Take by mouth.     diazepam (VALIUM) 5 MG tablet Take by mouth.      diclofenac Sodium (VOLTAREN) 1 % GEL Apply topically.     dicyclomine (BENTYL) 10 MG capsule Take by mouth.     donepezil (ARICEPT) 10 MG tablet Take by mouth.     fluconazole (DIFLUCAN) 150 MG tablet Take 150 mg by mouth once.     FLUoxetine (PROZAC) 20 MG capsule Take 1 capsule (20 mg total) by mouth daily. Take along with 40 mg daily - total of 60 mg daily 90 capsule 1   FLUoxetine (PROZAC) 40 MG capsule Take 1 capsule (40 mg total) by mouth daily. Take along with 20 mg daily. 90 capsule 0   fluticasone (FLONASE) 50 MCG/ACT nasal spray 1 spray into each nostril two (2) times a day.     furosemide (LASIX) 40 MG tablet Take 40 mg by mouth daily as needed for fluid.      GLOBAL EASE INJECT PEN NEEDLES 31G X 5 MM MISC Inject into the skin.     glucose blood test strip USE TO TEST BLOOD SUGAR TWO TIMES A DAY     incobotulinumtoxinA (XEOMIN) 100 units SOLR injection Inject 100 Units into the muscle every 3 (three) months.      insulin glargine (LANTUS) 100 UNIT/ML Solostar Pen Inject into the skin.     lamoTRIgine (LAMICTAL) 25 MG tablet Take 1 tablet (25 mg total) by mouth daily. 90 tablet 0   Lancets (FREESTYLE) lancets Test daily before all meals/snacks     LANTUS SOLOSTAR 100 UNIT/ML Solostar Pen Inject into the skin.     lidocaine (LIDODERM) 5 % Place onto the skin.     lisinopril (ZESTRIL) 20 MG tablet Take 1 tablet by mouth daily.     Melatonin 10 MG TBCR Take by mouth.     metFORMIN (GLUCOPHAGE-XR) 500 MG 24 hr tablet Take 1,000 mg by mouth 2 (two) times daily.     metoCLOPramide (REGLAN) 5 MG tablet Take 1 tablet (5 mg total) by mouth 4 (four) times daily -  before meals and at bedtime for 14 days. 56 tablet 0   mirabegron ER (MYRBETRIQ) 25 MG TB24 tablet Take 1 tablet by mouth daily.     mirtazapine (REMERON) 15 MG tablet TAKE 1 TABLET BY MOUTH AT  BEDTIME FOR SLEEP 90 tablet 3   montelukast (  SINGULAIR) 10 MG tablet Take 1 tablet by mouth daily.     naloxone (NARCAN) nasal spray 4  mg/0.1 mL One spray in either nostril once for known/suspected opioid overdose. May repeat every 2-3 minutes in alternating nostril til EMS arrives     nystatin (MYCOSTATIN/NYSTOP) powder Apply topically.     omeprazole (PRILOSEC) 40 MG capsule Take by mouth.     ondansetron (ZOFRAN) 4 MG tablet TAKE 1 TABLET BY MOUTH ONCE DAILY AS NEEDED FOR NAUSEA OR VOMITING 30 tablet 1   oxyCODONE (OXY IR/ROXICODONE) 5 MG immediate release tablet Take 5 mg by mouth 4 (four) times daily as needed.     oxyCODONE-acetaminophen (PERCOCET) 10-325 MG tablet Take 1 tablet by mouth 3 (three) times daily as needed.     XTAMPZA ER 13.5 MG C12A Take by mouth daily as needed.     No current facility-administered medications for this visit.     Musculoskeletal: Strength & Muscle Tone:  UTA Gait & Station:  Seated Patient leans: N/A  Psychiatric Specialty Exam: Review of Systems  Musculoskeletal:  Positive for back pain.       Hand - pain - Right sided  Psychiatric/Behavioral:  The patient is nervous/anxious.   All other systems reviewed and are negative.   Last menstrual period 08/20/2014.There is no height or weight on file to calculate BMI.  General Appearance: Casual  Eye Contact:  Fair  Speech:  Clear and Coherent  Volume:  Normal  Mood:  Anxious and frustrated  Affect:  Congruent  Thought Process:  Goal Directed and Descriptions of Associations: Intact  Orientation:  Full (Time, Place, and Person)  Thought Content: Logical   Suicidal Thoughts:  No  Homicidal Thoughts:  No  Memory:  Immediate;   Fair Recent;   Fair Remote;   Fair  Judgement:  Fair  Insight:  Fair  Psychomotor Activity:  Normal  Concentration:  Concentration: Fair and Attention Span: Fair  Recall:  [employer] of Knowledge: Fair  Language: Fair  Akathisia:  No  Handed:  Right  AIMS (if indicated): not done  Assets:  Communication Skills Desire for Improvement Housing Social Support  ADL's:  Intact  Cognition: WNL   Sleep:  Fair as long as pain is managed   Screenings: Administrator, Civil Service Office Visit from 04/05/2022 in [place] Office Visit from 10/16/2021 in [place] Total Score 0 0      GAD-7    [place] Office Visit from 04/05/2022 in [place] Video Visit from 06/06/2021 in [place]  Total GAD-7 Score 8 1      PHQ2-9    [place] Office Visit from 04/05/2022 in [place] Video Visit from 11/13/2021 in [place] Office Visit from 10/16/2021 in [place] Video Visit from 06/06/2021 in [place] Office Visit from 05/23/2021 in [place]  PHQ-2 Total Score 2 0 1 0 0  PHQ-9 Total Score 9 -- 5 -- --      Flowsheet Row Video Visit from 06/21/2022 in [place] Video Visit from 05/29/2022 in [place] Office Visit from 04/05/2022 in [place] No Risk No Risk No Risk  Assessment and Plan: Traci Davis is a 52 year old Caucasian female who has a history of depression, anxiety, chronic pain was evaluated by telemedicine today.  Patient is currently improving, will benefit from the following plan.  Plan MDD-improving Prozac 60 mg p.o. daily Lamictal 25 mg p.o. daily Patient was referred for CBT in the past-noncompliant.  GAD-improving Prozac 60 mg p.o. daily Mirtazapine 15 mg p.o. nightly Patient will need sufficient pain management.  Insomnia-improving Mirtazapine 15 mg p.o. nightly  Follow-up in clinic in 2 months or sooner if  needed.   Collaboration of Care: Collaboration of Care: Other patient encouraged to continue physical therapy, pain management.  Patient/Guardian was advised Release of Information must be obtained prior to any record release in order to collaborate their care with an outside provider. Patient/Guardian was advised if they have not already done so to contact the registration department to sign all necessary forms in order for [place] to release information regarding their care.   Consent: Patient/Guardian gives verbal consent for treatment and assignment of benefits for services provided during this visit. Patient/Guardian expressed understanding and agreed to proceed.   This note was generated in part or whole with voice recognition software. Voice recognition is usually quite accurate but there are transcription errors that can and very often do occur. I apologize for any typographical errors that were not detected and corrected.      Ursula Alert, MD 06/22/2022, 7:45 AM

## 2022-06-26 ENCOUNTER — Ambulatory Visit: Payer: 59

## 2022-06-28 ENCOUNTER — Ambulatory Visit: Payer: 59 | Admitting: Occupational Therapy

## 2022-06-28 ENCOUNTER — Ambulatory Visit: Payer: 59

## 2022-07-02 ENCOUNTER — Ambulatory Visit: Payer: Medicare HMO | Attending: Physician Assistant

## 2022-07-02 ENCOUNTER — Ambulatory Visit: Payer: Medicare HMO | Admitting: Occupational Therapy

## 2022-07-02 DIAGNOSIS — G801 Spastic diplegic cerebral palsy: Secondary | ICD-10-CM | POA: Diagnosis present

## 2022-07-02 DIAGNOSIS — M25531 Pain in right wrist: Secondary | ICD-10-CM | POA: Insufficient documentation

## 2022-07-02 DIAGNOSIS — G8929 Other chronic pain: Secondary | ICD-10-CM

## 2022-07-02 DIAGNOSIS — M654 Radial styloid tenosynovitis [de Quervain]: Secondary | ICD-10-CM | POA: Insufficient documentation

## 2022-07-02 DIAGNOSIS — M5459 Other low back pain: Secondary | ICD-10-CM

## 2022-07-02 DIAGNOSIS — M25561 Pain in right knee: Secondary | ICD-10-CM | POA: Insufficient documentation

## 2022-07-02 DIAGNOSIS — R262 Difficulty in walking, not elsewhere classified: Secondary | ICD-10-CM | POA: Diagnosis present

## 2022-07-02 DIAGNOSIS — G5603 Carpal tunnel syndrome, bilateral upper limbs: Secondary | ICD-10-CM | POA: Insufficient documentation

## 2022-07-02 DIAGNOSIS — M6281 Muscle weakness (generalized): Secondary | ICD-10-CM

## 2022-07-02 NOTE — Therapy (Signed)
OUTPATIENT PHYSICAL THERAPY TREATMENT NOTE Recert for today's session And Discharge Summary     Patient Name: Traci Davis MRN: [id number] DOB:Nov 06, 1970, 52 y.o., female Today's Date: 07/02/2022  PCP: Konrad Saha, MD  REFERRING PROVIDER: Leretha Dykes, DO    END OF SESSION:  PT End of Session - 07/02/22 1549     Visit Number 17    Number of Visits 29    Date for PT Re-Evaluation 07/02/22    Authorization Type --    Authorization Time Period --    Progress Note Due on Visit 10    PT Start Time [id number]    PT Stop Time 1628    PT Time Calculation (min) 39 min    Activity Tolerance Patient tolerated treatment well;No increased pain    Behavior During Therapy [facility] for tasks assessed/performed                           Past Medical History:  Diagnosis Date   Acute pancreatitis 09/04/2014   Anxiety    Arthritis    Asthma    Cerebral palsy (HCC)    Complication of anesthesia    panic attacks before surgery   COPD (chronic obstructive pulmonary disease) ([place])    Depression    Diabetes mellitus without complication ([place])    GERD (gastroesophageal reflux disease)    Hypertension    Noninfectious gastroenteritis and colitis 01/10/2013   Past Surgical History:  Procedure Laterality Date   CESAREAN SECTION  [place] N/A 02/20/2018   Procedure: DILATATION & CURETTAGE/HYSTEROSCOPY WITH MYOSURE ENDOMETRIAL POLYPECTOMY;  Surgeon: Will Bonnet, MD;  Location: [facility];  Service: Gynecology;  Laterality: N/A;   ESOPHAGOGASTRODUODENOSCOPY (EGD) WITH PROPOFOL N/A 01/01/2019   Procedure: ESOPHAGOGASTRODUODENOSCOPY (EGD) WITH PROPOFOL;  Surgeon: Lin Landsman, MD;  Location: [facility];  Service: Gastroenterology;  Laterality: N/A;   FOOT CAPSULE RELEASE W/ PERCUTANEOUS HEEL CORD LENGTHENING, TIBIAL TENDON TRANSFER     age 70 ,and 2010-both legs   JOINT REPLACEMENT Right     both knees partial replacements dates unknown   knee replacment     x 5 per pt. last one- left knee 2014. has had 2 on R 3 on L   TYMPANOPLASTY WITH GRAFT Right 01/08/2018   Procedure: TYMPANOPLASTY WITH POSSIBLE OSSICULAR CHAIN RECONSTRUCTION;  Surgeon: [name], MD;  Location: [facility];  Service: ENT;  Laterality: Right;   Patient Active Problem List   Diagnosis Date Noted   Medication reaction 08/29/2021   No-show for appointment 12/22/2020   Obesity due to excess calories 09/22/2020   Nausea 09/20/2020   Sinusitis 08/09/2020   Insomnia due to medical condition 05/11/2020   COPD (chronic obstructive pulmonary disease) ([place]) 03/22/2020   Hamstring tendonitis 01/29/2020   MDD (major depressive disorder), recurrent, in full remission ([place]) 08/12/2019   Seizure-like activity ([place]) 07/08/2019   Mild episode of recurrent major depressive disorder ([place]) 12/29/2018   Insomnia due to mental condition 12/29/2018   Disorder of vein 03/04/2018   Lumbar sprain 03/04/2018   Muscle weakness 03/04/2018   Neck sprain 03/04/2018   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   Postmenopausal bleeding 02/18/2018   Impingement syndrome of shoulder region 02/04/2018   Chest pain with moderate risk for cardiac etiology 05/19/2017   History of depression 04/15/2017   GAD (generalized anxiety disorder) 04/15/2017   Chronic daily headache 04/15/2017   Panic  attack 04/15/2017   Nocturnal enuresis 04/15/2017   Mild cognitive impairment 04/15/2017   Loss of memory 01/14/2017   Pain medication agreement signed 01/08/2017   Headache disorder 12/04/2016   Chronic, continuous use of opioids 07/01/2015   Vertigo 07/01/2015   Chronic midline low back pain without sciatica 03/21/2015   Acute renal failure (ARF) ([place]) 02/19/2015   Type 2 diabetes mellitus without complication ([place]) [id number]   Cerebral palsy ([place]) 01/05/2014   Cerebral palsy with spastic/ataxic diplegia 01/05/2014   Hip pain, chronic 04/28/2013    Essential hypertension 01/10/2013   Irritable colon 01/10/2013   Encounter for long-term (current) use of other medications 12/04/2012   Chronic GERD 08/12/2012   Epigastric pain 08/12/2012   Diarrhea 08/12/2012   Primary localized osteoarthrosis, lower leg 05/07/2012   Difficulty walking 02/06/2012    REFERRING DIAG: G80.1 (ICD-10-CM) - 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M79.604 (ICD-10-CM) - Pain in right leg M79.605 (ICD-10-CM) - Pain in left leg   THERAPY DIAG:  Difficulty in walking, not elsewhere classified - Plan: PT plan of care cert/re-cert  Chronic pain of right knee - Plan: PT plan of care cert/re-cert  Muscle weakness (generalized) - Plan: PT plan of care cert/re-cert  Other low back pain - Plan: PT plan of care cert/re-cert  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([place]) - Plan: PT plan of care cert/re-cert  Rationale for Evaluation and Treatment Rehabilitation  PERTINENT HISTORY: Pain in R and L legs, spastic diplegic cerebral palsy. Hurt her R mid back, feels like she pulled a muscle, making it difficult for her to reach across. Feels like someone is grinding their knuckles on her R mid back, feels like she pulled a muscle. R knee and knee cap has been bothering her. L LE has been dragging worse than last time. Takes fluid pills for swelling in her legs. No L LE pain. Recently got a new KAFO and finally broke them in (was way too tight when pt first put it on. Both legs feel weak. Has a knee brace for her R knee cap to help keep it from collapsing. Got trigger point injections for her neck and back this month (November 2023). L LE feels weak.   PRECAUTIONS: fall risk  SUBJECTIVE:     SUBJECTIVE STATEMENT: R knee is ok, not hurting really bad. Has had a migraine headache for 6 days straight and had a sinus infection. 5/10 mid back pain currently. The low back is iffy. Has not been able to do her exercises because her head hurt so bad. The exercises at home help. Ready to go  start walking on the track. Walking is better and not hurting her as bad to walk.   PAIN:  Are you having pain? See subjective.      TODAY'S TREATMENT:  DATE: 07/02/2022  Latex Allergies  Therapeutic exercise  Seated manually resisted hip flexion, hip extension, hip abduction, knee extension 1x each way for each LE  Reviewed progress with PT towards goals   Time taken for FOTO survey.   Sitting with upright posture   Gentle PT manual trunk perturbation all planes 1 minute x 3   B scapular retraction with yellow band 10x5 seconds, then 4x5 seconds (difficulty with R grip), then without resistance 10x5 seconds    To promote thoracic extension    Gait from treatment room to outside                 Cues for L hip circumduction, placing body weight onto L LE with B UE assist (L UE > R UE to decrease pain in R wrist and hand) from rw to promote R knee flexion in neutral for swing phase, and neutral knee for foot flat and push-off to decrease R hip IR and R knee genu valgus, and therefore decreasing stress to R knee.    Improved exercise technique, movement at target joints, use of target muscles after mod verbal, visual, tactile cues.      Response to treatment Pt tolerated session well without aggravation of symptoms.          Clinical impression  Pt demonstrates overall decreased back and R knee pain, improved B LE strength and function since initial HEP. Pt has made very good progress with PT towards goals. Skilled physical therapy discharged after recert for today's session with pt continuing progress with her exercises at home.        PATIENT EDUCATION: Education details: HEP, there-ex Person educated: Patient Education method: Explanation, Demonstration, Tactile cues, Verbal cues, and Handouts Education comprehension:  verbalized understanding and returned demonstration  HOME EXERCISE PROGRAM: Access Code: [email or web address] URL: [email or web address] Date: 03/21/2022 Prepared by: Joneen Boers  Exercises - Seated Hip Abduction with Resistance  - 1 x daily - 7 x weekly - 2 sets - 10 reps  Red band  - Seated Hip Adduction Isometrics with Ball  - 1 x daily - 7 x weekly - 3 sets - 10 reps - 5 seconds hold  - Standing Hip Abduction with Counter Support  - 1 x daily - 7 x weekly - 2-3 sets - 10 reps  Reclined   Hooklying   Hip extension isometrics    L 10x5 seconds for 3 sets     R 10x5 seconds for 3 sets  - Supine Posterior Pelvic Tilt  - 1 x daily - 7 x weekly - 3 sets - 10 reps - 5 seconds hold - Seated Long Arc Quad with Strap  - 2 x daily - 4 x weekly - 4 sets - 5 reps - 5 seconds hold       PT Short Term Goals - 05/16/22 1742       PT SHORT TERM GOAL #1   Title Pt will be independent with her initial HEP to decrease R thigh, knee and back pain, improve B LE strength and ability to ambulate with less difficulty.    Baseline No questions with her initial HEP (05/16/2022)    Time 3    Period Weeks    Status Achieved    Target Date 04/05/22              PT Long Term Goals - 07/02/22 1554       PT LONG TERM GOAL #1   Title Pt will have  a decrease in R knee pain to 4/10 or less at worst to promote ability to ambulate with less difficulty.    Baseline R knee pain 9/10 at worst for the past 3 months (03/13/2022); R knee overall feels better compared to prior to starting PT, 7/10 R lateral femoral condyle at most for the past 7 days (05/16/2022); 5/10 R knee pain at worst for the past 7 days (06/13/2022); R knee feels better when applying the R knee mechanics when walking; no pain level provided (07/02/2022)    Time 6    Period Weeks    Status Partially Met    Target Date 06/28/22      PT LONG TERM GOAL #2   Title Pt will have a decrease in mid back pain to 4/10 or less at worst to  promote ability to ambulate, perform standing tasks with less difficulty.    Baseline 10/10 mid back pain at worst for the past 3-4 days (03/13/2022); 8/10 at worst for the past 2 weeks, the exercises help (05/16/2022); 7/10 mid back pain at worst for the past 7 days (06/13/2022); 5/10 at worst for the past 7 days (07/02/2022)    Time 6    Period Weeks    Status Partially Met    Target Date 06/28/22      PT LONG TERM GOAL #3   Title Pt will improve B hip flexion, extension, abduction, and knee extension strength by at least 1/2 MMT grade to promote ability to ambulate with less difficulty.    Baseline Hip flexion 4-/5 R, 3/5 L, hip extension 4-/5 R and L, hip abduction 4-/5 R, 3+/5 L, knee extension 4+/5 R, 3-/5 L (03/13/2022); seated manually resisted: hip flexion 4-/5 R, 4-/5 L, hip extension 4/5 R and L, hip abduction 4-/5 R, 3+/5 L, knee extension 4+/5 R, 3-/5 L (05/16/2022); hip flexion 4/5 R, 4-/5 L, hip extension 4+/5 R, 4/5 L, hip abduction 4/5 R, 4-/5 L, knee extension 4+/5 R, 3-/5 (07/02/2022)    Time 6    Period Weeks    Status Partially Met    Target Date 06/28/22      PT LONG TERM GOAL #4   Title Pt will improve her FOTO score by at least 10 points as a demonstration of improved function.    Baseline Initial FOTO score not yet obtained. (03/13/2022); Upper leg FOTO 39 (03/21/2022); Upper leg FOTO 35. Pt however states legs feel better since starting PT (05/16/2022); 52 (07/02/2022)    Time 6    Period Weeks    Status Achieved    Target Date 06/28/22              Plan - 07/02/22 1549     Clinical Impression Statement Pt demonstrates overall decreased back and R knee pain, improved B LE strength and function since initial HEP. Pt has made very good progress with PT towards goals. Skilled physical therapy discharged after recert for today's session with pt continuing progress with her exercises at home.    Personal Factors and Comorbidities Comorbidity 3+;Past/Current Experience;Time  since onset of injury/illness/exacerbation;Fitness    Comorbidities Anxiety, arthritis, COPD, DM, depression, HTN    Examination-Activity Limitations Stairs;Lift;Caring for Others;Transfers;Carry;Locomotion Level;Squat    Stability/Clinical Decision Making Stable/Uncomplicated    Clinical Decision Making Low    Rehab Potential Fair    PT Frequency One time visit    PT Duration --    PT Treatment/Interventions Gait training;Stair training;Functional mobility training;Therapeutic activities;Therapeutic exercise;Balance training;Neuromuscular re-education;Patient/family education;Manual  techniques    PT Next Visit Plan Continue progress with her May Access Code: [email or web address]    Consulted and Agree with Plan of Care Patient                  Thank you for your referral.   Joneen Boers PT, DPT  07/02/2022, 6:20 PM

## 2022-07-04 ENCOUNTER — Ambulatory Visit: Payer: Medicare HMO | Admitting: Occupational Therapy

## 2022-07-04 ENCOUNTER — Ambulatory Visit: Payer: Medicare HMO

## 2022-07-09 ENCOUNTER — Encounter: Payer: 59 | Admitting: Occupational Therapy

## 2022-07-09 DIAGNOSIS — R569 Unspecified convulsions: Secondary | ICD-10-CM | POA: Insufficient documentation

## 2022-07-11 ENCOUNTER — Ambulatory Visit: Payer: Medicare HMO | Admitting: Occupational Therapy

## 2022-07-11 DIAGNOSIS — M654 Radial styloid tenosynovitis [de Quervain]: Secondary | ICD-10-CM

## 2022-07-11 DIAGNOSIS — G5603 Carpal tunnel syndrome, bilateral upper limbs: Secondary | ICD-10-CM

## 2022-07-11 DIAGNOSIS — R262 Difficulty in walking, not elsewhere classified: Secondary | ICD-10-CM | POA: Diagnosis not present

## 2022-07-11 DIAGNOSIS — M25531 Pain in right wrist: Secondary | ICD-10-CM

## 2022-07-11 NOTE — Therapy (Signed)
[facility] [address], [place], [address] Phone: [phone]   Fax:  [phone]  Occupational Therapy Treatment  Patient Details  Name: Traci Davis MRN: [id number] Date of Birth: Feb 04, 1971 Referring Provider (OT): [place] PA   Encounter Date: 07/11/2022   OT End of Session - 07/11/22 1849     Visit Number 2    Number of Visits 8    Date for OT Re-Evaluation 08/02/22    OT Start Time 5    OT Stop Time 1450    OT Time Calculation (min) 40 min    Activity Tolerance Patient tolerated treatment well    Behavior During Therapy [facility] for tasks assessed/performed             Past Medical History:  Diagnosis Date   Acute pancreatitis 09/04/2014   Anxiety    Arthritis    Asthma    Cerebral palsy ([place])    Complication of anesthesia    panic attacks before surgery   COPD (chronic obstructive pulmonary disease) ([place])    Depression    Diabetes mellitus without complication ([place])    GERD (gastroesophageal reflux disease)    Hypertension    Noninfectious gastroenteritis and colitis 01/10/2013    Past Surgical History:  Procedure Laterality Date   [place] N/A 02/20/2018   Procedure: DILATATION & CURETTAGE/HYSTEROSCOPY WITH MYOSURE ENDOMETRIAL POLYPECTOMY;  Surgeon: Will Bonnet, MD;  Location: [facility];  Service: Gynecology;  Laterality: N/A;   ESOPHAGOGASTRODUODENOSCOPY (EGD) WITH PROPOFOL N/A 01/01/2019   Procedure: ESOPHAGOGASTRODUODENOSCOPY (EGD) WITH PROPOFOL;  Surgeon: Lin Landsman, MD;  Location: [facility];  Service: Gastroenterology;  Laterality: N/A;   FOOT CAPSULE RELEASE W/ PERCUTANEOUS HEEL CORD LENGTHENING, TIBIAL TENDON TRANSFER     age 52 ,and 2010-both legs   JOINT REPLACEMENT Right    both knees partial replacements dates unknown   knee replacment     x 5 per pt. last one- left knee 2014.  has had 2 on R 3 on L   TYMPANOPLASTY WITH GRAFT Right 01/08/2018   Procedure: TYMPANOPLASTY WITH POSSIBLE OSSICULAR CHAIN RECONSTRUCTION;  Surgeon: [name], MD;  Location: [facility];  Service: ENT;  Laterality: Right;    There were no vitals filed for this visit.   Subjective Assessment - 07/11/22 1845     Subjective  They did the nerve test and showed carpal tunnel, and another nerve -as well as tendinitis. My wrist still hurting 7/10 and hand goes numb mostly at night time with my splints on. I want to avoid surgery and I cannot wear splint then I can't grip my walker. I did put WD-40 on brace and easier to lock and unlock    Pertinent History MD visit 06/12/22 -  Right wrist tendonitis and carpal tunnel syndrome: -- Referral to Occupational therapy at [facility] in [place] with Gwenette Greet to address the right wrist tendonitis and carpal tunnel syndrome. -- Wear your right wrist brace during the day especially when you are using your walker.    Patient Stated Goals Want the pain in my thumbs and hands better as well as the numbness    Currently in Pain? Yes    Pain Score 7     Pain Location --   1st dorsal compartment   Pain Orientation Right    Pain Descriptors / Indicators Aching;Tender    Pain Type Acute  pain;Chronic pain    Pain Onset More than a month ago                     Patient arrived today after being seen by MD for testing nerve conduction.  Patient positive for ulnar nerve compression at [facility] as well as carpal tunnel on the right.  As well as tendinitis over first dorsal compartment. Patient arrived with 7/10 and tenderness over first dorsal compartment. Patient do appear to have more ease locking and unlocking her leg brace after applying WD-40 and using second digit. Patient asking about forearm-based elbow support for walker. Did apply attachment to walker and assist patient.  Appear patient would benefit from bilateral forearm to prevent nerve  compression and tendinitis in the left and decreased symptoms on the right. Patient was to try and avoid surgery because of walker use as well as custom splints not safe to use with the mount of pressure she put her hands with a regular walker. Recommended for patient that if she gets the forearm-based elbow support for her walker to follow-up with PT again     OT Treatments/Exercises (OP) - 07/11/22 0001       Iontophoresis   Type of Iontophoresis Dexamethasone    Location R 1st dorsal compartment    Dose med patch, 1.4 current -    Time 20 min            Patient and daughter was educated in use of contrast at home to right hand followed by carpal spreads and webspace spreads for soft tissue Patient fitted with a prefab thumb spica to use if can Ionto done this date patient educated and tolerated well.  Patient did had a small pin size blister but would disappear in 24 hours.  Would recommend to do 4 sessions of ionto with contrast soft tissue and range of motion as well as splint use when patient get forearm-based support for walker.      OT Education - 07/11/22 1848     Education Details progress  and splint/tx changes    Person(s) Educated Patient    Methods Explanation;Demonstration;Tactile cues;Verbal cues;Handout    Comprehension Verbal cues required;Returned demonstration;Verbalized understanding                 OT Long Term Goals - 07/11/22 1904       OT LONG TERM GOAL #1   Title Patient to be independent and splint wearing as well as modifications with grip using walker and unlocking and locking the brace to decrease pain to left to 4-10 first dorsal compartment    Baseline Patient using thumb radial abduction and hyperal extension of thumb on the walker as well as locking and unlocking leg brace several times during the day.  Pain 7/10 with positive Wynn [place]lin - NOW Pain still 7/10 but using 2nd digit and more ease after application of [id number]    Time 3     Period Weeks    Status On-going    Target Date 08/02/22      OT LONG TERM GOAL #2   Title Patient able to use walker as well as managing leg brace without increase symptoms    Baseline Pain 7/10 with positive Wynn [place]. - NOW same - did assess pt with forearm support on walker - pt contact MD for prescription    Time 3    Period Weeks    Status On-going    Target Date 08/02/22  Plan - 07/11/22 1850     Clinical Impression Statement Patient presented OT evaluation 3 wks ago with a diagnosis of carpal tunnel as well as tendinitis of the extensor tendon at the first dorsal compartment on the right.  Patient's pain and tenderness over first dorsal compartment 7/10.  With positive Finkelstein.  Patient's wrist active range of motion within normal limits with pain with ulnar deviation and wrist flexion/extension end range.  Patient with increased tightness in the thumb webspace but thumb palmar radial abduction within functional limits.  Patient uses walker with a knee brace on the left.  Heavily dependent on her hands on walker.  Diagnosis of the CP.  Patient do have bilateral wrist splints that she use at nighttime but R hand still goes numb.  Recommended for pt last time to spray some WD40 on her locks for leg brace to increase ease of locking and unlocking as well as  using 2nd digit - and this date great progress in that. FItted this date pt with prefab thumb spica to use if she can with walker. Pt asked about forearm support on walker- assess pt this date with one on R side. Pt did well and would recommend for pt bilateral forearm supports for walker because of Ulnar and med N compression at R wrist as well as tendinitis - pt try to avoid surgery and cannot wear custom thumb spica if needs to depend on hands to WB on walker.  Pt and daughter ed on contrast and soft tissue to CT and webspace spreads. Pt can benefit from forearm base walker support, prefab thumb spica and  Ionto. Pt can benefit from skilled OT services to decrease tightnes in webspace -decrease pain and symptoms of CT - ed on modifying and joint protection , splint use and Iontophoresis with dexamethazone. Would recommend for pt to follow up with PT when she recieve her forearm base support for walker.    OT Occupational Profile and History Problem Focused Assessment - Including review of records relating to presenting problem    Occupational performance deficits (Please refer to evaluation for details): ADL's;Play;Leisure;Rest and Sleep;IADL's    Body Structure / Function / Physical Skills ADL;Decreased knowledge of use of DME;UE functional use;Pain;Strength;IADL    Rehab Potential Fair    Clinical Decision Making Limited treatment options, no task modification necessary    Comorbidities Affecting Occupational Performance: May have comorbidities impacting occupational performance    OT Frequency 2x / week    OT Duration --   3 wks   OT Treatment/Interventions Self-care/ADL training;Cryotherapy;Contrast Bath;Iontophoresis;Therapeutic exercise;DME and/or AE instruction;Manual Therapy;Splinting;Patient/family education    Consulted and Agree with Plan of Care Patient             Patient will benefit from skilled therapeutic intervention in order to improve the following deficits and impairments:   Body Structure / Function / Physical Skills: ADL, Decreased knowledge of use of DME, UE functional use, Pain, Strength, IADL       Visit Diagnosis: Carpal tunnel syndrome, bilateral upper limbs  Pain in right wrist  Radial styloid tenosynovitis    Problem List Patient Active Problem List   Diagnosis Date Noted   Medication reaction 08/29/2021   No-show for appointment 12/22/2020   Obesity due to excess calories 09/22/2020   Nausea 09/20/2020   Sinusitis 08/09/2020   Insomnia due to medical condition 05/11/2020   COPD (chronic obstructive pulmonary disease) ([place]) 03/22/2020   Hamstring  tendonitis 01/29/2020   MDD (major depressive disorder),  recurrent, in full remission ([place]) 08/12/2019   Seizure-like activity ([place]) 07/08/2019   Mild episode of recurrent major depressive disorder ([place]) 12/29/2018   Insomnia due to mental condition 12/29/2018   Disorder of vein 03/04/2018   Lumbar sprain 03/04/2018   Muscle weakness 03/04/2018   Neck sprain 03/04/2018   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   Postmenopausal bleeding 02/18/2018   Impingement syndrome of shoulder region 02/04/2018   Chest pain with moderate risk for cardiac etiology 05/19/2017   History of depression 04/15/2017   GAD (generalized anxiety disorder) 04/15/2017   Chronic daily headache 04/15/2017   Panic attack 04/15/2017   Nocturnal enuresis 04/15/2017   Mild cognitive impairment 04/15/2017   Loss of memory 01/14/2017   Pain medication agreement signed 01/08/2017   Headache disorder 12/04/2016   Chronic, continuous use of opioids 07/01/2015   Vertigo 07/01/2015   Chronic midline low back pain without sciatica 03/21/2015   Acute renal failure (ARF) ([place]) 02/19/2015   Type 2 diabetes mellitus without complication ([place]) [id number]   Cerebral palsy ([place]) 01/05/2014   Cerebral palsy with spastic/ataxic diplegia 01/05/2014   Hip pain, chronic 04/28/2013   Essential hypertension 01/10/2013   Irritable colon 01/10/2013   Encounter for long-term (current) use of other medications 12/04/2012   Chronic GERD 08/12/2012   Epigastric pain 08/12/2012   Diarrhea 08/12/2012   Primary localized osteoarthrosis, lower leg 05/07/2012   Difficulty walking 02/06/2012    Rosalyn Gess, OTR/L,CLT 07/11/2022, 7:06 PM  [facility] [address], [place], [address] Phone: [phone]   Fax:  [phone]  Name: QAMAR KOSAKA MRN: [id number] Date of Birth: [birth year]

## 2022-07-17 ENCOUNTER — Encounter: Payer: 59 | Admitting: Occupational Therapy

## 2022-07-17 DIAGNOSIS — N951 Menopausal and female climacteric states: Secondary | ICD-10-CM | POA: Insufficient documentation

## 2022-07-19 ENCOUNTER — Encounter: Payer: 59 | Admitting: Occupational Therapy

## 2022-07-23 ENCOUNTER — Encounter: Payer: Self-pay | Admitting: Occupational Therapy

## 2022-07-23 ENCOUNTER — Encounter: Payer: 59 | Admitting: Occupational Therapy

## 2022-07-23 ENCOUNTER — Ambulatory Visit: Payer: Medicare HMO | Admitting: Occupational Therapy

## 2022-07-23 DIAGNOSIS — G5603 Carpal tunnel syndrome, bilateral upper limbs: Secondary | ICD-10-CM

## 2022-07-23 DIAGNOSIS — M654 Radial styloid tenosynovitis [de Quervain]: Secondary | ICD-10-CM

## 2022-07-23 DIAGNOSIS — M25531 Pain in right wrist: Secondary | ICD-10-CM

## 2022-07-23 DIAGNOSIS — R262 Difficulty in walking, not elsewhere classified: Secondary | ICD-10-CM | POA: Diagnosis not present

## 2022-07-23 DIAGNOSIS — M6281 Muscle weakness (generalized): Secondary | ICD-10-CM

## 2022-07-23 NOTE — Therapy (Unsigned)
[facility] [address], [place], [address] Phone: [phone]   Fax:  [phone]  Occupational Therapy Treatment  Patient Details  Name: Traci Davis MRN: [id number] Date of Birth: 08/29/1970 Referring Provider (OT): [place] PA   Encounter Date: 07/23/2022   OT End of Session - 07/23/22 2122     Visit Number 3    Number of Visits 8    Date for OT Re-Evaluation 08/02/22    OT Start Time 1435    OT Stop Time [id number]    OT Time Calculation (min) 55 min    Activity Tolerance Patient tolerated treatment well    Behavior During Therapy [facility] for tasks assessed/performed             Past Medical History:  Diagnosis Date   Acute pancreatitis 09/04/2014   Anxiety    Arthritis    Asthma    Cerebral palsy ([place])    Complication of anesthesia    panic attacks before surgery   COPD (chronic obstructive pulmonary disease) ([place])    Depression    Diabetes mellitus without complication ([place])    GERD (gastroesophageal reflux disease)    Hypertension    Noninfectious gastroenteritis and colitis 01/10/2013    Past Surgical History:  Procedure Laterality Date   [place] N/A 02/20/2018   Procedure: DILATATION & CURETTAGE/HYSTEROSCOPY WITH MYOSURE ENDOMETRIAL POLYPECTOMY;  Surgeon: Will Bonnet, MD;  Location: [facility];  Service: Gynecology;  Laterality: N/A;   ESOPHAGOGASTRODUODENOSCOPY (EGD) WITH PROPOFOL N/A 01/01/2019   Procedure: ESOPHAGOGASTRODUODENOSCOPY (EGD) WITH PROPOFOL;  Surgeon: Lin Landsman, MD;  Location: [facility];  Service: Gastroenterology;  Laterality: N/A;   FOOT CAPSULE RELEASE W/ PERCUTANEOUS HEEL CORD LENGTHENING, TIBIAL TENDON TRANSFER     age 52 ,and 2010-both legs   JOINT REPLACEMENT Right    both knees partial replacements dates unknown   knee replacment     x 5 per pt. last one- left knee 2014.  has had 2 on R 3 on L   TYMPANOPLASTY WITH GRAFT Right 01/08/2018   Procedure: TYMPANOPLASTY WITH POSSIBLE OSSICULAR CHAIN RECONSTRUCTION;  Surgeon: [name], MD;  Location: [facility];  Service: ENT;  Laterality: Right;    There were no vitals filed for this visit.   Subjective Assessment - 07/23/22 2120     Subjective  Pt reports she is still waiting to get a new walker, with bilateral platforms.    Pertinent History MD visit 06/12/22 -  Right wrist tendonitis and carpal tunnel syndrome: -- Referral to Occupational therapy at [facility] in [place] with Gwenette Greet to address the right wrist tendonitis and carpal tunnel syndrome. -- Wear your right wrist brace during the day especially when you are using your walker.    Patient Stated Goals Want the pain in my thumbs and hands better as well as the numbness    Currently in Pain? Yes    Pain Score 6     Pain Location Other (Comment)   thumb and wrist   Pain Orientation Right    Pain Descriptors / Indicators Aching;Tender    Pain Type Acute pain    Pain Onset More than a month ago    Pain Frequency Intermittent            She has a TENS unit at home and been using it on her hand.    Contrast:  Pt seen this date for use of contrast for edema control to decrease edema, pain, increase tissue mobility and ROM.  Performed for 11 mins total.  Reviewed contrast as a part of home program, pt came alone this date to therapy session.  Pt can also do ice massage to first dorsal compartment.    Manual Therapy:  Following contrast, pt seen for carpal spreads and webspace spreads for soft tissue mobility followed by AROM R hand.  Therapeutic Exercises:   AROM of right hand for tendon gliding, opposition, thumb palmar and radial ABD, ADD.  Wrist AROM in all planes.  Iontophoresis:     OT Treatments/Exercises (OP) - 07/23/22 2126       Iontophoresis   Type of Iontophoresis Dexamethasone    Location R 1st dorsal compartment    Dose med  patch, 2.0 current -    Time 19                    OT Education - 07/23/22 2122     Education Details progress  and splint/tx changes    Person(s) Educated Patient    Methods Explanation;Demonstration;Tactile cues;Verbal cues;Handout    Comprehension Verbal cues required;Returned demonstration;Verbalized understanding                 OT Long Term Goals - 07/11/22 1904       OT LONG TERM GOAL #1   Title Patient to be independent and splint wearing as well as modifications with grip using walker and unlocking and locking the brace to decrease pain to left to 4-10 first dorsal compartment    Baseline Patient using thumb radial abduction and hyperal extension of thumb on the walker as well as locking and unlocking leg brace several times during the day.  Pain 7/10 with positive Wynn Maudlin - NOW Pain still 7/10 but using 2nd digit and more ease after application of [id number]    Time 3    Period Weeks    Status On-going    Target Date 08/02/22      OT LONG TERM GOAL #2   Title Patient able to use walker as well as managing leg brace without increase symptoms    Baseline Pain 7/10 with positive Wynn Maudlin. - NOW same - did assess pt with forearm support on walker - pt contact MD for prescription    Time 3    Period Weeks    Status On-going    Target Date 08/02/22                   Plan - 07/23/22 2123     Clinical Impression Statement Patient presented OT evaluation 3 wks ago with a diagnosis of carpal tunnel as well as tendinitis of the extensor tendon at the first dorsal compartment on the right.  Patient's pain and tenderness over first dorsal compartment 7/10.  With positive Finkelstein.  Patient's wrist active range of motion within normal limits with pain with ulnar deviation and wrist flexion/extension end range.  Patient with increased tightness in the thumb webspace but thumb palmar radial abduction within functional limits.  Patient uses walker with a  knee brace on the left.  Heavily dependent on her hands on walker with diagnosis of CP.  Patient has bilateral wrist splints that she uses at nighttime but R hand still goes numb.  Recommended for pt last time to spray some WD40 on her locks for leg brace to increase ease of locking and unlocking as well  as  using 2nd digit - and this date great progress in that. FItted this date pt with prefab thumb spica to use if she can with walker. Pt asked about forearm support on walker- assess pt this date with one on R side. Pt did well and would recommend for pt bilateral forearm supports for walker because of Ulnar and med N compression at R wrist as well as tendinitis - pt try to avoid surgery and cannot wear custom thumb spica if needs to depend on hands to WB on walker.  Pt and daughter ed on contrast and soft tissue to CT and webspace spreads. Pt can benefit from forearm base walker support, prefab thumb spica and Ionto.  Pt has not gotten her new walker yet and still heavily dependent on hand and wrist use on walker, she reports using tens unit at home for pain with some temporary relief.  She will need to modify and change gripping patterns with walker use to decrease stress and effects of weightbearing which appear to be aggravating hand and wrist. Continue to adminster ionto to decrease inflammation and improve functional use.  Pt can benefit from skilled OT services to decrease tightnes in webspace -decrease pain and symptoms of CT - ed on modifying and joint protection , splint use and Iontophoresis with dexamethazone. Would recommend for pt to follow up with PT when she receives her forearm base support for walker.    OT Occupational Profile and History Problem Focused Assessment - Including review of records relating to presenting problem    Occupational performance deficits (Please refer to evaluation for details): ADL's;Play;Leisure;Rest and Sleep;IADL's    Body Structure / Function / Physical Skills  ADL;Decreased knowledge of use of DME;UE functional use;Pain;Strength;IADL    Rehab Potential Fair    Clinical Decision Making Limited treatment options, no task modification necessary    Comorbidities Affecting Occupational Performance: May have comorbidities impacting occupational performance    OT Frequency 2x / week    OT Duration --   3 wks   OT Treatment/Interventions Self-care/ADL training;Cryotherapy;Contrast Bath;Iontophoresis;Therapeutic exercise;DME and/or AE instruction;Manual Therapy;Splinting;Patient/family education    Consulted and Agree with Plan of Care Patient             Patient will benefit from skilled therapeutic intervention in order to improve the following deficits and impairments:   Body Structure / Function / Physical Skills: ADL, Decreased knowledge of use of DME, UE functional use, Pain, Strength, IADL       Visit Diagnosis: Carpal tunnel syndrome, bilateral upper limbs  Pain in right wrist  Radial styloid tenosynovitis  Muscle weakness (generalized)    Problem List Patient Active Problem List   Diagnosis Date Noted   Medication reaction 08/29/2021   No-show for appointment 12/22/2020   Obesity due to excess calories 09/22/2020   Nausea 09/20/2020   Sinusitis 08/09/2020   Insomnia due to medical condition 05/11/2020   COPD (chronic obstructive pulmonary disease) ([place]) 03/22/2020   Hamstring tendonitis 01/29/2020   MDD (major depressive disorder), recurrent, in full remission ([place]) 08/12/2019   Seizure-like activity ([place]) 07/08/2019   Mild episode of recurrent major depressive disorder ([place]) 12/29/2018   Insomnia due to mental condition 12/29/2018   Disorder of vein 03/04/2018   Lumbar sprain 03/04/2018   Muscle weakness 03/04/2018   Neck sprain 03/04/2018   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   Postmenopausal bleeding 02/18/2018   Impingement syndrome of shoulder region 02/04/2018   Chest pain with moderate risk for cardiac etiology  05/19/2017   History of depression 04/15/2017   GAD (generalized anxiety disorder) 04/15/2017   Chronic daily headache 04/15/2017   Panic attack 04/15/2017   Nocturnal enuresis 04/15/2017   Mild cognitive impairment 04/15/2017   Loss of memory 01/14/2017   Pain medication agreement signed 01/08/2017   Headache disorder 12/04/2016   Chronic, continuous use of opioids 07/01/2015   Vertigo 07/01/2015   Chronic midline low back pain without sciatica 03/21/2015   Acute renal failure (ARF) ([place]) 02/19/2015   Type 2 diabetes mellitus without complication ([place]) [id number]   Cerebral palsy ([place]) 01/05/2014   Cerebral palsy with spastic/ataxic diplegia 01/05/2014   Hip pain, chronic 04/28/2013   Essential hypertension 01/10/2013   Irritable colon 01/10/2013   Encounter for long-term (current) use of other medications 12/04/2012   Chronic GERD 08/12/2012   Epigastric pain 08/12/2012   Diarrhea 08/12/2012   Primary localized osteoarthrosis, lower leg 05/07/2012   Difficulty walking 02/06/2012   Caraline Deutschman T Tomasita Morrow, OTR/L, CLT  Pellegrino Kennard, OT 07/24/2022, 8:15 PM  [facility] [address], [place], [address] Phone: [phone]   Fax:  [phone]  Name: Traci Davis MRN: [id number] Date of Birth: 1971/04/28

## 2022-07-25 ENCOUNTER — Encounter: Payer: 59 | Admitting: Occupational Therapy

## 2022-07-26 ENCOUNTER — Ambulatory Visit: Payer: Medicare HMO | Admitting: Occupational Therapy

## 2022-07-26 DIAGNOSIS — M654 Radial styloid tenosynovitis [de Quervain]: Secondary | ICD-10-CM

## 2022-07-26 DIAGNOSIS — G5603 Carpal tunnel syndrome, bilateral upper limbs: Secondary | ICD-10-CM

## 2022-07-26 DIAGNOSIS — M6281 Muscle weakness (generalized): Secondary | ICD-10-CM

## 2022-07-26 DIAGNOSIS — M25531 Pain in right wrist: Secondary | ICD-10-CM

## 2022-07-26 DIAGNOSIS — R262 Difficulty in walking, not elsewhere classified: Secondary | ICD-10-CM | POA: Diagnosis not present

## 2022-07-29 ENCOUNTER — Encounter: Payer: Self-pay | Admitting: Occupational Therapy

## 2022-07-29 NOTE — Therapy (Signed)
[facility] [address] [place], [place], [address] Phone: [phone]   Fax:  [phone]  Occupational Therapy Treatment  Patient Details  Name: Traci Davis MRN: [id number] Date of Birth: 1970/09/07 Referring Provider (OT): [place] PA   Encounter Date: 07/26/2022   OT End of Session - 07/29/22 2131     Visit Number 4    Number of Visits 8    Date for OT Re-Evaluation 08/02/22    OT Start Time 1445    OT Stop Time [id number]    OT Time Calculation (min) 45 min    Activity Tolerance Patient tolerated treatment well    Behavior During Therapy [facility] for tasks assessed/performed             Past Medical History:  Diagnosis Date   Acute pancreatitis 09/04/2014   Anxiety    Arthritis    Asthma    Cerebral palsy ([place])    Complication of anesthesia    panic attacks before surgery   COPD (chronic obstructive pulmonary disease) ([place])    Depression    Diabetes mellitus without complication ([place])    GERD (gastroesophageal reflux disease)    Hypertension    Noninfectious gastroenteritis and colitis 01/10/2013    Past Surgical History:  Procedure Laterality Date   [place] N/A 02/20/2018   Procedure: DILATATION & CURETTAGE/HYSTEROSCOPY WITH MYOSURE ENDOMETRIAL POLYPECTOMY;  Surgeon: Will Bonnet, MD;  Location: [facility];  Service: Gynecology;  Laterality: N/A;   ESOPHAGOGASTRODUODENOSCOPY (EGD) WITH PROPOFOL N/A 01/01/2019   Procedure: ESOPHAGOGASTRODUODENOSCOPY (EGD) WITH PROPOFOL;  Surgeon: Lin Landsman, MD;  Location: [facility];  Service: Gastroenterology;  Laterality: N/A;   FOOT CAPSULE RELEASE W/ PERCUTANEOUS HEEL CORD LENGTHENING, TIBIAL TENDON TRANSFER     age 68 ,and 2010-both legs   JOINT REPLACEMENT Right    both knees partial replacements dates unknown   knee replacment     x 5 per pt. last one- left knee 2014.  has had 2 on R 3 on L   TYMPANOPLASTY WITH GRAFT Right 01/08/2018   Procedure: TYMPANOPLASTY WITH POSSIBLE OSSICULAR CHAIN RECONSTRUCTION;  Surgeon: [name], MD;  Location: [facility];  Service: ENT;  Laterality: Right;    There were no vitals filed for this visit.   Subjective Assessment - 07/29/22 2130     Subjective  Pt reports she is having difficulty with getting her insulin and calling her MD and pharmacy.    Pertinent History MD visit 06/12/22 -  Right wrist tendonitis and carpal tunnel syndrome: -- Referral to Occupational therapy at [facility] in [place] with Gwenette Greet to address the right wrist tendonitis and carpal tunnel syndrome. -- Wear your right wrist brace during the day especially when you are using your walker.    Patient Stated Goals Want the pain in my thumbs and hands better as well as the numbness    Currently in Pain? Yes    Pain Score 6     Pain Location --   thumb and wrist   Pain Orientation Right    Pain Descriptors / Indicators Aching;Tender    Pain Type Acute pain    Pain Onset More than a month ago    Pain Frequency Intermittent             Contrast:  Pt seen this date for use of contrast for edema control to decrease edema,  pain, increase tissue mobility and ROM.  Performed for 11 mins total.  Reviewed contrast as a part of home program, pt came alone this date to therapy session.  Pt can also do ice massage to first dorsal compartment.     Manual Therapy:  Following contrast, pt seen for carpal spreads and webspace spreads for soft tissue mobility followed by AROM R hand.   Therapeutic Exercises:   AROM of right hand for tendon gliding, opposition, thumb palmar and radial ABD, ADD.  Wrist AROM in all planes.   3rd tx of ionto:   OT Treatments/Exercises (OP) - 07/29/22 2134       Iontophoresis   Type of Iontophoresis Dexamethasone    Location R 1st dorsal compartment    Dose med patch, 2.0 current -    Time 19      Manual Therapy    Manual therapy comments increased muscles spasms and trigger points along both hip/gluteal regions, gluteus medius and piriformis muscles primarily with decreased active and passive hip ER noted pre treatment                    OT Education - 07/29/22 2131     Education Details progress  and splint/tx changes    Person(s) Educated Patient    Methods Explanation;Demonstration;Tactile cues;Verbal cues;Handout    Comprehension Verbal cues required;Returned demonstration;Verbalized understanding                 OT Long Term Goals - 07/11/22 1904       OT LONG TERM GOAL #1   Title Patient to be independent and splint wearing as well as modifications with grip using walker and unlocking and locking the brace to decrease pain to left to 4-10 first dorsal compartment    Baseline Patient using thumb radial abduction and hyperal extension of thumb on the walker as well as locking and unlocking leg brace several times during the day.  Pain 7/10 with positive Wynn Maudlin - NOW Pain still 7/10 but using 2nd digit and more ease after application of [id number]    Time 3    Period Weeks    Status On-going    Target Date 08/02/22      OT LONG TERM GOAL #2   Title Patient able to use walker as well as managing leg brace without increase symptoms    Baseline Pain 7/10 with positive Wynn Maudlin. - NOW same - did assess pt with forearm support on walker - pt contact MD for prescription    Time 3    Period Weeks    Status On-going    Target Date 08/02/22                   Plan - 07/29/22 2132     Clinical Impression Statement Patient presented OT evaluation 3 wks ago with a diagnosis of carpal tunnel as well as tendinitis of the extensor tendon at the first dorsal compartment on the right.  Patient's pain and tenderness over first dorsal compartment 7/10.  With positive Finkelstein.  Patient's wrist active range of motion within normal limits with pain with ulnar deviation and wrist  flexion/extension end range.  Patient with increased tightness in the thumb webspace but thumb palmar radial abduction within functional limits.  Patient uses walker with a knee brace on the left.  Heavily dependent on her hands on walker with diagnosis of CP.  Patient has bilateral wrist splints that she uses at nighttime but R hand  still goes numb.  Recommended for pt last time to spray some WD40 on her locks for leg brace to increase ease of locking and unlocking as well as  using 2nd digit - and this date great progress in that. FItted this date pt with prefab thumb spica to use if she can with walker. Pt asked about forearm support on walker- assess pt this date with one on R side. Pt did well and would recommend for pt bilateral forearm supports for walker because of Ulnar and med N compression at R wrist as well as tendinitis - pt try to avoid surgery and cannot wear custom thumb spica if needs to depend on hands to WB on walker.  Pt and daughter ed on contrast and soft tissue to CT and webspace spreads. Pt can benefit from forearm base walker support, prefab thumb spica and Ionto.  Pt still has not gotten her new walker yet and still heavily dependent on hand and wrist use on walker, she reports using tens unit at home for pain with some temporary relief.  She will need to modify and change gripping patterns with walker use to decrease stress and effects of weightbearing which appear to be aggravating hand and wrist.  3rd session of ionto this date to decrease pain and inflammation.  Pt having issues with her medication this week and had to take calls regarding her prescription today during session.   Continue to adminster ionto to decrease inflammation and improve functional use  Pt can benefit from skilled OT services to decrease tightnes in webspace -decrease pain and symptoms of CT - ed on modifying and joint protection , splint use and Iontophoresis with dexamethazone. Would recommend for pt to follow  up with PT when she receives her forearm base support for walker.    OT Occupational Profile and History Problem Focused Assessment - Including review of records relating to presenting problem    Occupational performance deficits (Please refer to evaluation for details): ADL's;Play;Leisure;Rest and Sleep;IADL's    Body Structure / Function / Physical Skills ADL;Decreased knowledge of use of DME;UE functional use;Pain;Strength;IADL    Rehab Potential Fair    Clinical Decision Making Limited treatment options, no task modification necessary    Comorbidities Affecting Occupational Performance: May have comorbidities impacting occupational performance    OT Frequency 2x / week    OT Duration --   3 wks   OT Treatment/Interventions Self-care/ADL training;Cryotherapy;Contrast Bath;Iontophoresis;Therapeutic exercise;DME and/or AE instruction;Manual Therapy;Splinting;Patient/family education    Consulted and Agree with Plan of Care Patient             Patient will benefit from skilled therapeutic intervention in order to improve the following deficits and impairments:   Body Structure / Function / Physical Skills: ADL, Decreased knowledge of use of DME, UE functional use, Pain, Strength, IADL       Visit Diagnosis: Carpal tunnel syndrome, bilateral upper limbs  Pain in right wrist  Muscle weakness (generalized)  Radial styloid tenosynovitis    Problem List Patient Active Problem List   Diagnosis Date Noted   Medication reaction 08/29/2021   No-show for appointment 12/22/2020   Obesity due to excess calories 09/22/2020   Nausea 09/20/2020   Sinusitis 08/09/2020   Insomnia due to medical condition 05/11/2020   COPD (chronic obstructive pulmonary disease) ([place]) 03/22/2020   Hamstring tendonitis 01/29/2020   MDD (major depressive disorder), recurrent, in full remission ([place]) 08/12/2019   Seizure-like activity ([place]) 07/08/2019   Mild episode of recurrent major  depressive disorder  ([place]) 12/29/2018   Insomnia due to mental condition 12/29/2018   Disorder of vein 03/04/2018   Lumbar sprain 03/04/2018   Muscle weakness 03/04/2018   Neck sprain 03/04/2018   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   Postmenopausal bleeding 02/18/2018   Impingement syndrome of shoulder region 02/04/2018   Chest pain with moderate risk for cardiac etiology 05/19/2017   History of depression 04/15/2017   GAD (generalized anxiety disorder) 04/15/2017   Chronic daily headache 04/15/2017   Panic attack 04/15/2017   Nocturnal enuresis 04/15/2017   Mild cognitive impairment 04/15/2017   Loss of memory 01/14/2017   Pain medication agreement signed 01/08/2017   Headache disorder 12/04/2016   Chronic, continuous use of opioids 07/01/2015   Vertigo 07/01/2015   Chronic midline low back pain without sciatica 03/21/2015   Acute renal failure (ARF) ([place]) 02/19/2015   Type 2 diabetes mellitus without complication ([place]) [id number]   Cerebral palsy ([place]) 01/05/2014   Cerebral palsy with spastic/ataxic diplegia 01/05/2014   Hip pain, chronic 04/28/2013   Essential hypertension 01/10/2013   Irritable colon 01/10/2013   Encounter for long-term (current) use of other medications 12/04/2012   Chronic GERD 08/12/2012   Epigastric pain 08/12/2012   Diarrhea 08/12/2012   Primary localized osteoarthrosis, lower leg 05/07/2012   Difficulty walking 02/06/2012   Masey Scheiber T Tomasita Morrow, OTR/L, CLT  Khyli Swaim, OT 07/29/2022, 9:38 PM  [facility] [address], [place], [address] Phone: [phone]   Fax:  [phone]  Name: Traci Davis MRN: [id number] Date of Birth: June 16, 1970

## 2022-07-30 ENCOUNTER — Ambulatory Visit: Payer: Medicare HMO | Admitting: Occupational Therapy

## 2022-07-30 ENCOUNTER — Other Ambulatory Visit: Payer: Self-pay | Admitting: Psychiatry

## 2022-07-30 DIAGNOSIS — F331 Major depressive disorder, recurrent, moderate: Secondary | ICD-10-CM

## 2022-07-30 DIAGNOSIS — F411 Generalized anxiety disorder: Secondary | ICD-10-CM

## 2022-08-02 ENCOUNTER — Ambulatory Visit: Payer: Medicare HMO | Admitting: Occupational Therapy

## 2022-08-14 ENCOUNTER — Telehealth: Payer: Self-pay

## 2022-08-14 NOTE — Telephone Encounter (Signed)
Patient left voicemail requesting a refill for the following medication please advise Last  visit 06/21/22 Next visit 08/27/22  mirtazapine (REMERON) 15 MG tablet    Pharmacy [facility] - [place], [place] - [address] Phone: [phone]  Fax: [phone]

## 2022-08-14 NOTE — Telephone Encounter (Signed)
mirtazapine (REMERON) 15 MG tablet 90 tablet 3 01/04/2022    Sig: TAKE 1 TABLET BY MOUTH AT  BEDTIME FOR SLEEP   Sent to pharmacy as: mirtazapine (REMERON) 15 MG tablet   Notes to Pharmacy: Please send a replace/new response with 100-Day Supply if appropriate to maximize member benefit. Requesting 1 year supply.   E-Prescribing Status: Receipt confirmed by pharmacy (01/04/2022  5:51 PM EDT)    Please verify this with patient 1 year supply was sent out in September, she is not due per our record.

## 2022-08-14 NOTE — Telephone Encounter (Signed)
Noted  

## 2022-08-14 NOTE — Telephone Encounter (Signed)
Called to inform patient she stated she was confused and she also had changed insurances so she is ok she is going to get the medication from [facility]

## 2022-08-21 ENCOUNTER — Telehealth: Payer: Self-pay

## 2022-08-27 ENCOUNTER — Encounter: Payer: Self-pay | Admitting: Psychiatry

## 2022-08-27 ENCOUNTER — Telehealth (INDEPENDENT_AMBULATORY_CARE_PROVIDER_SITE_OTHER): Payer: Medicare HMO | Admitting: Psychiatry

## 2022-08-27 DIAGNOSIS — F3342 Major depressive disorder, recurrent, in full remission: Secondary | ICD-10-CM | POA: Diagnosis not present

## 2022-08-27 DIAGNOSIS — G4701 Insomnia due to medical condition: Secondary | ICD-10-CM

## 2022-08-27 DIAGNOSIS — F411 Generalized anxiety disorder: Secondary | ICD-10-CM | POA: Diagnosis not present

## 2022-08-27 MED ORDER — MIRTAZAPINE 15 MG PO TABS
ORAL_TABLET | ORAL | 0 refills | Status: DC
Start: 2022-08-27 — End: 2022-11-11

## 2022-08-27 NOTE — Progress Notes (Signed)
Virtual Visit via Video Note  I connected with Traci Davis on 08/27/22 at  2:00 PM EDT by a video enabled telemedicine application and verified that I am speaking with the correct person using two identifiers.  Location Provider Location : [facility] Patient Location : Car  Participants: Patient , Boyfriend ,Provider   I discussed the limitations of evaluation and management by telemedicine and the availability of in person appointments. The patient expressed understanding and agreed to proceed.  I discussed the assessment and treatment plan with the patient. The patient was provided an opportunity to ask questions and all were answered. The patient agreed with the plan and demonstrated an understanding of the instructions.   The patient was advised to call back or seek an in-person evaluation if the symptoms worsen or if the condition fails to improve as anticipated.   [facility] MD OP Progress Note  08/28/2022 9:57 AM Traci Davis  MRN:  [id number]  Chief Complaint:  Chief Complaint  Patient presents with   Follow-up   Anxiety   Depression   Medication Refill   HPI: Traci Davis is a 52 year old Caucasian female, lives in [facility], has a history of MDD, GAD, insomnia, cerebral palsy, diabetes mellitus on SSD who was evaluated by telemedicine today.  Patient today reports she is currently on her way to another doctor's appointment.  She hence is riding in her car and her boyfriend is driving.  She reports everything is going well today and in the past few weeks she has been doing fairly well on the current medication regimen.  She reports she could not get her mirtazapine filled since she changed her pharmacy.  She would like it sent to the local pharmacy.  Patient reports she is planning to switch her pharmacy again on May 1 since she is going to change her health insurance plan.  Once she makes that switch she will let this provider know.  Patient reports she is currently doing fairly  well on the current medication regimen.  Denies any significant sadness or anxiety.  Denies any mood swings.  Reports sleep is overall okay.  Patient does struggle with pain especially of her wrist, history of carpal tunnel syndrome.  She reports she was recently started on gabapentin to address the pain.  She wants to give it more time.  Patient denies any suicidality, homicidality or perceptual disturbances.  Denies any other concerns today.  Visit Diagnosis:    ICD-10-CM   1. MDD (major depressive disorder), recurrent, in full remission (HCC)  F33.42 mirtazapine (REMERON) 15 MG tablet    2. GAD (generalized anxiety disorder)  F41.1 mirtazapine (REMERON) 15 MG tablet    3. Insomnia due to medical condition  G47.01 mirtazapine (REMERON) 15 MG tablet   pain, mood      Past Psychiatric History: I have reviewed past psychiatric history from progress note on 09/12/2017.  Past trials of Xanax, Klonopin, Cymbalta, Rozerem, Ambien, trazodone, doxepin.  Past Medical History:  Past Medical History:  Diagnosis Date   Acute pancreatitis 09/04/2014   Anxiety    Arthritis    Asthma    Cerebral palsy (HCC)    Complication of anesthesia    panic attacks before surgery   COPD (chronic obstructive pulmonary disease) (HCC)    Depression    Diabetes mellitus without complication (HCC)    GERD (gastroesophageal reflux disease)    Hypertension    Noninfectious gastroenteritis and colitis 01/10/2013    Past Surgical History:  Procedure Laterality Date   CESAREAN SECTION  1995   CHOLECYSTECTOMY     DILATATION & CURETTAGE/HYSTEROSCOPY WITH MYOSURE N/A 02/20/2018   Procedure: DILATATION & CURETTAGE/HYSTEROSCOPY WITH MYOSURE ENDOMETRIAL POLYPECTOMY;  Surgeon: Conard Novak, MD;  Location: [facility] ORS;  Service: Gynecology;  Laterality: N/A;   ESOPHAGOGASTRODUODENOSCOPY (EGD) WITH PROPOFOL N/A 01/01/2019   Procedure: ESOPHAGOGASTRODUODENOSCOPY (EGD) WITH PROPOFOL;  Surgeon: Toney Reil, MD;   Location: [facility] END[facility]COPY;  Service: Gastroenterology;  Laterality: N/A;   FOOT CAPSULE RELEASE W/ PERCUTANEOUS HEEL CORD LENGTHENING, TIBIAL TENDON TRANSFER     age 8 ,and 2010-both legs   JOINT REPLACEMENT Right    both knees partial replacements dates unknown   knee replacment     x 5 per pt. last one- left knee 2014. has had 2 on R 3 on L   TYMPANOPLASTY WITH GRAFT Right 01/08/2018   Procedure: TYMPANOPLASTY WITH POSSIBLE OSSICULAR CHAIN RECONSTRUCTION;  Surgeon: Geanie Logan, MD;  Location: [facility] ORS;  Service: ENT;  Laterality: Right;    Family Psychiatric History: I have reviewed family psychiatric history from progress note on 09/12/2017.  Family History:  Family History  Problem Relation Age of Onset   CAD Father    Heart attack Brother    Sexual abuse Paternal Grandfather    Breast cancer Neg Hx     Social History: I have reviewed social history from progress note on 09/12/2017. Social History   Socioeconomic History   Marital status: Single    Spouse name: Not on file   Number of children: 1   Years of education: Not on file   Highest education level: Associate degree: occupational, Scientist, product/process development, or vocational program  Occupational History   Not on file  Tobacco Use   Smoking status: Never   Smokeless tobacco: Never  Vaping Use   Vaping Use: Never used  Substance and Sexual Activity   Alcohol use: No   Drug use: No   Sexual activity: Yes    Partners: Male    Birth control/protection: Condom  Other Topics Concern   Not on file  Social History Narrative   Not on file   Social Determinants of Health   Financial Resource Strain: Not on file  Food Insecurity: Not on file  Transportation Needs: Not on file  Physical Activity: Not on file  Stress: Not on file  Social Connections: Not on file    Allergies:  Allergies  Allergen Reactions   Meloxicam Other (See Comments)    Damage kidney   Morphine And Related Other (See Comments)    hallucinations    Vantin [Cefpodoxime] Nausea And Vomiting   Latex Itching   Baclofen Other (See Comments)    "makes cerebral palsy do adverse reaction on me", tightens muscles    Ciprofloxacin Itching and Nausea And Vomiting   Tape Rash    skin tears.  Paper tape is ok   Tramadol Nausea Only    Metabolic Disorder Labs: Lab Results  Component Value Date   HGBA1C 6.1 (H) 02/19/2015   No results found for: "PROLACTIN" No results found for: "CHOL", "TRIG", "HDL", "CHOLHDL", "VLDL", "LDLCALC" Lab Results  Component Value Date   TSH 2.198 07/10/2016   TSH 2.891 02/19/2015    Therapeutic Level Labs: No results found for: "LITHIUM" No results found for: "VALPROATE" No results found for: "CBMZ"  Current Medications: Current Outpatient Medications  Medication Sig Dispense Refill   estradiol (ESTRACE) 0.1 MG/GM vaginal cream Place vaginally.     gabapentin (NEURONTIN) 300  MG capsule Take by mouth.     acetaminophen (TYLENOL) 325 MG tablet Take 2 tablets (650 mg total) by mouth every 6 (six) hours as needed. 60 tablet 0   albuterol (PROVENTIL HFA;VENTOLIN HFA) 108 (90 BASE) MCG/ACT inhaler Inhale 2 puffs into the lungs 4 (four) times daily as needed. For shortness of breath and/or wheezing     ANORO ELLIPTA 62.5-25 MCG/INH AEPB      atorvastatin (LIPITOR) 20 MG tablet Take 1 tablet by mouth daily.     azelastine (ASTELIN) 0.1 % nasal spray Place into both nostrils.     Blood Glucose Monitoring Suppl (GLUCOCOM BLOOD GLUCOSE MONITOR) DEVI Test daily before all meals/snacks and once before bedtime.     budesonide-formoterol (SYMBICORT) 80-4.5 MCG/ACT inhaler Inhale 2 puffs up to 4 times daily as needed for symptoms of wheezing or shortness of breath.     Continuous Blood Gluc Receiver (DEXCOM G6 RECEIVER) DEVI 1 Units by Miscellaneous route continuous.     Continuous Blood Gluc Transmit (DEXCOM G6 TRANSMITTER) MISC 1 Units by Miscellaneous route Every three (3) months.     CREON 36000 units CPEP capsule       cyclobenzaprine (FLEXERIL) 10 MG tablet Take by mouth.     diazepam (VALIUM) 5 MG tablet Take by mouth.     diclofenac Sodium (VOLTAREN) 1 % GEL Apply topically.     dicyclomine (BENTYL) 10 MG capsule Take by mouth.     donepezil (ARICEPT) 10 MG tablet Take by mouth.     fluconazole (DIFLUCAN) 150 MG tablet Take 150 mg by mouth once.     FLUoxetine (PROZAC) 20 MG capsule Take 1 capsule (20 mg total) by mouth daily. Take along with 40 mg daily - total of 60 mg daily 90 capsule 1   FLUoxetine (PROZAC) 40 MG capsule Take 1 capsule (40 mg total) by mouth daily. Take along with 20 mg daily. 90 capsule 0   fluticasone (FLONASE) 50 MCG/ACT nasal spray 1 spray into each nostril two (2) times a day.     furosemide (LASIX) 40 MG tablet Take 40 mg by mouth daily as needed for fluid.      GLOBAL EASE INJECT PEN NEEDLES 31G X 5 MM MISC Inject into the skin.     glucose blood test strip USE TO TEST BLOOD SUGAR TWO TIMES A DAY     incobotulinumtoxinA (XEOMIN) 100 units SOLR injection Inject 100 Units into the muscle every 3 (three) months.      Incontinence Supply Disposable (PREVAIL BLADDER CONTROL PAD) MISC Large incontinence pads to go into underwear     insulin glargine (LANTUS) 100 UNIT/ML Solostar Pen Inject into the skin.     lamoTRIgine (LAMICTAL) 25 MG tablet Take 1 tablet (25 mg total) by mouth daily. 90 tablet 0   Lancets (FREESTYLE) lancets Test daily before all meals/snacks     LANTUS SOLOSTAR 100 UNIT/ML Solostar Pen Inject into the skin.     lidocaine (LIDODERM) 5 % Place onto the skin.     Melatonin 10 MG TBCR Take by mouth.     metFORMIN (GLUCOPHAGE-XR) 500 MG 24 hr tablet Take 1,000 mg by mouth 2 (two) times daily.     metoCLOPramide (REGLAN) 5 MG tablet Take 1 tablet (5 mg total) by mouth 4 (four) times daily -  before meals and at bedtime for 14 days. 56 tablet 0   mirabegron ER (MYRBETRIQ) 25 MG TB24 tablet Take 1 tablet by mouth daily.  mirtazapine (REMERON) 15 MG tablet TAKE 1  TABLET BY MOUTH AT  BEDTIME FOR SLEEP 90 tablet 0   montelukast (SINGULAIR) 10 MG tablet Take 1 tablet by mouth daily.     naloxone (NARCAN) nasal spray 4 mg/0.1 mL One spray in either nostril once for known/suspected opioid overdose. May repeat every 2-3 minutes in alternating nostril til EMS arrives     nystatin (MYCOSTATIN/NYSTOP) powder Apply topically.     omeprazole (PRILOSEC) 40 MG capsule Take by mouth.     ondansetron (ZOFRAN) 4 MG tablet TAKE 1 TABLET BY MOUTH ONCE DAILY AS NEEDED FOR NAUSEA OR VOMITING 30 tablet 1   oxyCODONE ER 13.5 MG C12A Take by mouth.     oxyCODONE-acetaminophen (PERCOCET) 10-325 MG tablet Take 1 tablet by mouth 3 (three) times daily as needed.     oxyCODONE-acetaminophen (PERCOCET) 10-325 MG tablet Take by mouth.     SUMAtriptan (IMITREX) 100 MG tablet Take 100 mg by mouth as directed.     XTAMPZA ER 13.5 MG C12A Take by mouth daily as needed.     No current facility-administered medications for this visit.     Musculoskeletal: Strength & Muscle Tone:  UTA Gait & Station:  Seated Patient leans: N/A  Psychiatric Specialty Exam: Review of Systems  Musculoskeletal:  Positive for arthralgias and back pain.  Psychiatric/Behavioral: Negative.    All other systems reviewed and are negative.   Last menstrual period 08/20/2014.There is no height or weight on file to calculate BMI.  General Appearance: Casual  Eye Contact:  Fair  Speech:  Clear and Coherent  Volume:  Normal  Mood:  Euthymic  Affect:  Congruent  Thought Process:  Goal Directed and Descriptions of Associations: Intact  Orientation:  Full (Time, Place, and Person)  Thought Content: Logical   Suicidal Thoughts:  No  Homicidal Thoughts:  No  Memory:  Immediate;   Fair Recent;   Fair Remote;   Fair  Judgement:  Intact  Insight:  Fair  Psychomotor Activity:  Normal  Concentration:  Concentration: Fair and Attention Span: Fair  Recall:  [employer] of Knowledge: Fair  Language: Fair   Akathisia:  No  Handed:  Right  AIMS (if indicated): not done  Assets:  Communication Skills Desire for Improvement Housing Social Support  ADL's:  Intact  Cognition: WNL  Sleep:  Fair   Screenings: Midwife Visit from 04/05/2022 in [facility] Office Visit from 10/16/2021 in [facility]  AIMS Total Score 0 0      GAD-7    Flowsheet Row Office Visit from 04/05/2022 in [facility] Video Visit from 06/06/2021 in [facility]  Total GAD-7 Score 8 1      PHQ2-9    Flowsheet Row Office Visit from 04/05/2022 in [facility] Video Visit from 11/13/2021 in [facility] Office Visit from 10/16/2021 in [facility] Video Visit from 06/06/2021 in [facility] Office Visit from 05/23/2021 in [facility]  PHQ-2 Total Score 2 0 1 0 0  PHQ-9 Total Score 9 -- 5 -- --      Flowsheet Row Video Visit from 06/21/2022 in [facility] Video Visit from 05/29/2022 in [facility] Office Visit from 04/05/2022 in [facility]  C-SSRS RISK CATEGORY No Risk No Risk No Risk        Assessment and Plan: Traci Davis is a 52 year old Caucasian female who has a history of depression, anxiety, chronic pain was evaluated by telemedicine today.  Patient is currently stable.  Plan as noted below.  Plan MDD in remission Prozac 60 mg p.o. daily Lamictal 25 mg p.o. daily  GAD-stable Prozac 60 mg p.o. daily Mirtazapine 15 mg p.o. nightly Patient will need sufficient pain management  Insomnia-improving Mirtazapine 15 mg  p.o. nightly   Follow-up in clinic in 3 months or sooner if needed. Collaboration of Care: Collaboration of Care: Other patient to continue pain management, physical therapy.  Patient/Guardian was advised Release of Information must be obtained prior to any record release in order to collaborate their care with an outside provider. Patient/Guardian was advised if they have not already done so to contact the registration department to sign all necessary forms in order for [place] to release information regarding their care.   Consent: Patient/Guardian gives verbal consent for treatment and assignment of benefits for services provided during this visit. Patient/Guardian expressed understanding and agreed to proceed.   This note was generated in part or whole with voice recognition software. Voice recognition is usually quite accurate but there are transcription errors that can and very often do occur. I apologize for any typographical errors that were not detected and corrected.    Jomarie Longs, MD 08/28/2022, 9:57 AM

## 2022-09-01 ENCOUNTER — Other Ambulatory Visit: Payer: Self-pay | Admitting: Psychiatry

## 2022-09-01 DIAGNOSIS — F331 Major depressive disorder, recurrent, moderate: Secondary | ICD-10-CM

## 2022-09-01 DIAGNOSIS — F411 Generalized anxiety disorder: Secondary | ICD-10-CM

## 2022-09-04 ENCOUNTER — Telehealth: Payer: Self-pay | Admitting: Psychiatry

## 2022-09-04 DIAGNOSIS — F331 Major depressive disorder, recurrent, moderate: Secondary | ICD-10-CM

## 2022-09-04 DIAGNOSIS — F411 Generalized anxiety disorder: Secondary | ICD-10-CM

## 2022-09-04 NOTE — Telephone Encounter (Unsigned)
Patient called and asked for a refill on her anxiety medication lamotrigine-please advise

## 2022-09-04 NOTE — Telephone Encounter (Signed)
pt left message that she needs a refill on the prozac 40mg  and the lamotrigine. pt was last seen on 4-29 next appt 8-5-

## 2022-09-05 ENCOUNTER — Other Ambulatory Visit: Payer: Self-pay | Admitting: Psychiatry

## 2022-09-05 DIAGNOSIS — F331 Major depressive disorder, recurrent, moderate: Secondary | ICD-10-CM

## 2022-09-05 DIAGNOSIS — F411 Generalized anxiety disorder: Secondary | ICD-10-CM

## 2022-09-05 MED ORDER — LAMOTRIGINE 25 MG PO TABS: 25.0000 mg | ORAL_TABLET | Freq: Every day | ORAL | 2 refills | Status: DC

## 2022-09-05 MED ORDER — LAMOTRIGINE 25 MG PO TABS: 25.0000 mg | ORAL_TABLET | Freq: Every day | ORAL | 0 refills | Status: DC

## 2022-09-05 MED ORDER — FLUOXETINE HCL 20 MG PO CAPS: 20.0000 mg | ORAL_CAPSULE | Freq: Every day | ORAL | 1 refills | Status: DC

## 2022-09-05 MED ORDER — FLUOXETINE HCL 40 MG PO CAPS: 40.0000 mg | ORAL_CAPSULE | Freq: Every day | ORAL | 0 refills | Status: DC

## 2022-09-05 NOTE — Telephone Encounter (Signed)
she uses [facility]

## 2022-09-05 NOTE — Telephone Encounter (Signed)
Pt.notified

## 2022-09-05 NOTE — Telephone Encounter (Signed)
From: Fanny Bien To: Office of Jomarie Longs, MD Sent: 08/31/2022 5:56 PM EDT Subject: Medication Renewal Request  Refills have been requested for the following medications:   lamoTRIgine (LAMICTAL) 25 MG tablet [Andre Swander]  Patient Comment: Dr. Hartley Barefoot I am completely out of this mess and I need a refill sent to [facility]. ASAP. THANK YOU Mikele Kuhlmann   Preferred pharmacy: [facility] - [place], [place] - [address] Delivery method: [employer]

## 2022-09-05 NOTE — Telephone Encounter (Signed)
I have sent Prozac to [facility].

## 2022-09-05 NOTE — Telephone Encounter (Signed)
I have sent a short supply to [facility].

## 2022-09-05 NOTE — Telephone Encounter (Signed)
Please clarify with patient what pharmacy she wants her Prozac and lamotrigine sent out to since patient had recently mentioned that she has a new pharmacy.  Please let me know.

## 2022-09-05 NOTE — Addendum Note (Signed)
Addended byJomarie Longs on: 09/05/2022 03:08 PM   Modules accepted: Orders

## 2022-09-05 NOTE — Telephone Encounter (Signed)
I have sent lamotrigine 25 mg to [facility].

## 2022-10-08 DIAGNOSIS — M654 Radial styloid tenosynovitis [de Quervain]: Secondary | ICD-10-CM | POA: Insufficient documentation

## 2022-11-07 NOTE — Telephone Encounter (Signed)
Error

## 2022-11-09 ENCOUNTER — Other Ambulatory Visit: Payer: Self-pay | Admitting: Psychiatry

## 2022-11-09 DIAGNOSIS — F411 Generalized anxiety disorder: Secondary | ICD-10-CM

## 2022-11-09 DIAGNOSIS — G4701 Insomnia due to medical condition: Secondary | ICD-10-CM

## 2022-11-09 DIAGNOSIS — F3342 Major depressive disorder, recurrent, in full remission: Secondary | ICD-10-CM

## 2022-11-28 ENCOUNTER — Other Ambulatory Visit: Payer: Self-pay | Admitting: Psychiatry

## 2022-11-28 DIAGNOSIS — F411 Generalized anxiety disorder: Secondary | ICD-10-CM

## 2022-11-28 DIAGNOSIS — F331 Major depressive disorder, recurrent, moderate: Secondary | ICD-10-CM

## 2022-11-29 ENCOUNTER — Telehealth: Payer: Self-pay | Admitting: Psychiatry

## 2022-11-29 NOTE — Telephone Encounter (Signed)
Contacted patient to clarify what pharmacy she wants medication sent to since I received a message from [facility] for her Lamictal.  Patient wants to change Lamictal to [facility].  She wants to stop sending Lamictal to [facility].

## 2022-12-03 ENCOUNTER — Telehealth: Payer: Medicare HMO | Admitting: Psychiatry

## 2022-12-06 ENCOUNTER — Encounter: Payer: Self-pay | Admitting: Psychiatry

## 2022-12-06 ENCOUNTER — Telehealth (INDEPENDENT_AMBULATORY_CARE_PROVIDER_SITE_OTHER): Payer: 59 | Admitting: Psychiatry

## 2022-12-06 DIAGNOSIS — F411 Generalized anxiety disorder: Secondary | ICD-10-CM | POA: Diagnosis not present

## 2022-12-06 DIAGNOSIS — F3342 Major depressive disorder, recurrent, in full remission: Secondary | ICD-10-CM | POA: Diagnosis not present

## 2022-12-06 DIAGNOSIS — G4701 Insomnia due to medical condition: Secondary | ICD-10-CM

## 2022-12-06 MED ORDER — FLUOXETINE HCL 20 MG PO CAPS
20.0000 mg | ORAL_CAPSULE | Freq: Every day | ORAL | 1 refills | Status: DC
Start: 2022-12-06 — End: 2023-06-10

## 2022-12-06 MED ORDER — FLUOXETINE HCL 40 MG PO CAPS
40.0000 mg | ORAL_CAPSULE | Freq: Every day | ORAL | 1 refills | Status: DC
Start: 2022-12-06 — End: 2023-06-10

## 2022-12-06 NOTE — Progress Notes (Signed)
Virtual Visit via Video Note  I connected with Traci Davis on 12/06/22 at  3:30 PM EDT by a video enabled telemedicine application and verified that I am speaking with the correct person using two identifiers.  Location Provider Location : [facility] Patient Location : Home  Participants: Patient , Provider    I discussed the limitations of evaluation and management by telemedicine and the availability of in person appointments. The patient expressed understanding and agreed to proceed.   I discussed the assessment and treatment plan with the patient. The patient was provided an opportunity to ask questions and all were answered. The patient agreed with the plan and demonstrated an understanding of the instructions.   The patient was advised to call back or seek an in-person evaluation if the symptoms worsen or if the condition fails to improve as anticipated.   BH MD OP Progress Note  12/07/2022 8:16 AM Traci Davis  MRN:  [id number]  Chief Complaint:  Chief Complaint  Patient presents with   Follow-up   Depression   Anxiety   HPI: Traci Davis is a 52 year old Caucasian female, lives in [facility], has a history of MDD, GAD, insomnia, cerebral palsy, diabetes mellitus on SSD was evaluated by telemedicine today.  Patient today reports she is currently struggling with knee pain, has started physical therapy which has been helpful.  She however reports she often worries about her chronic pain and her disability from the same.  Patient reports she is often anxious, restless due to that.  She however is not interested in more medication changes at this time.  Agreeable to establish care with the therapist.  Patient reports sleep is overall good.  Pain does affect her sleep at times.  Patient denies any suicidality, homicidality or perceptual disturbances.  She is compliant on medications like mirtazapine, Prozac, Lamictal.  Denies side effects.  Patient has good social support  system, her daughter is a huge support for her.  She also reports boyfriend is supportive.  Patient denies any other concerns today.  Visit Diagnosis:    ICD-10-CM   1. MDD (major depressive disorder), recurrent, in full remission (HCC)  F33.42 FLUoxetine (PROZAC) 20 MG capsule    2. GAD (generalized anxiety disorder)  F41.1 FLUoxetine (PROZAC) 40 MG capsule    FLUoxetine (PROZAC) 20 MG capsule    3. Insomnia due to medical condition  G47.01    Pain, mood      Past Psychiatric History: I have reviewed past psychiatric history from progress note on 09/12/2017.  Past trials of Xanax, Klonopin, Cymbalta, Rozerem, Ambien, trazodone, doxepin.  Past Medical History:  Past Medical History:  Diagnosis Date   Acute pancreatitis 09/04/2014   Anxiety    Arthritis    Asthma    Cerebral palsy (HCC)    Complication of anesthesia    panic attacks before surgery   COPD (chronic obstructive pulmonary disease) (HCC)    Depression    Diabetes mellitus without complication (HCC)    GERD (gastroesophageal reflux disease)    Hypertension    Noninfectious gastroenteritis and colitis 01/10/2013    Past Surgical History:  Procedure Laterality Date   CESAREAN SECTION  1995   CHOLECYSTECTOMY     DILATATION & CURETTAGE/HYSTEROSCOPY WITH MYOSURE N/A 02/20/2018   Procedure: DILATATION & CURETTAGE/HYSTEROSCOPY WITH MYOSURE ENDOMETRIAL POLYPECTOMY;  Surgeon: Conard Novak, MD;  Location: [facility] ORS;  Service: Gynecology;  Laterality: N/A;   ESOPHAGOGASTRODUODENOSCOPY (EGD) WITH PROPOFOL N/A 01/01/2019   Procedure: ESOPHAGOGASTRODUODENOSCOPY (  EGD) WITH PROPOFOL;  Surgeon: Toney Reil, MD;  Location: [facility];  Service: Gastroenterology;  Laterality: N/A;   FOOT CAPSULE RELEASE W/ PERCUTANEOUS HEEL CORD LENGTHENING, TIBIAL TENDON TRANSFER     age 59 ,and 2010-both legs   JOINT REPLACEMENT Right    both knees partial replacements dates unknown   knee replacment     x 5 per pt. last one-  left knee 2014. has had 2 on R 3 on L   TYMPANOPLASTY WITH GRAFT Right 01/08/2018   Procedure: TYMPANOPLASTY WITH POSSIBLE OSSICULAR CHAIN RECONSTRUCTION;  Surgeon: Geanie Logan, MD;  Location: [facility];  Service: ENT;  Laterality: Right;    Family Psychiatric History: I have reviewed family psychiatric history from progress note on 09/12/2017.  Family History:  Family History  Problem Relation Age of Onset   CAD Father    Heart attack Brother    Sexual abuse Paternal Grandfather    Breast cancer Neg Hx     Social History: I have reviewed social history from progress note on 09/12/2017. Social History   Socioeconomic History   Marital status: Single    Spouse name: Not on file   Number of children: 1   Years of education: Not on file   Highest education level: Associate degree: occupational, Scientist, product/process development, or vocational program  Occupational History   Not on file  Tobacco Use   Smoking status: Never   Smokeless tobacco: Never  Vaping Use   Vaping status: Never Used  Substance and Sexual Activity   Alcohol use: No   Drug use: No   Sexual activity: Yes    Partners: Male    Birth control/protection: Condom  Other Topics Concern   Not on file  Social History Narrative   Not on file   Social Determinants of Health   Financial Resource Strain: Medium Risk (01/10/2022)   [facility]   Overall Financial Resource Strain (CARDIA)    Difficulty of Paying Living Expenses: Somewhat hard  Food Insecurity: Food Insecurity Present (01/10/2022)   Received from [facility]   Hunger Vital Sign    Worried About Running Out of Food in the Last Year: Sometimes true    Ran Out of Food in the Last Year: Never true  Transportation Needs: No Transportation Needs (01/10/2022)   Received from [facility]   PRAPARE - Transportation    Lack of Transportation (Medical): No    Lack of Transportation (Non-Medical): No   Physical Activity: Not on file  Stress: Not on file  Social Connections: Not on file    Allergies:  Allergies  Allergen Reactions   Meloxicam Other (See Comments)    Damage kidney   Morphine And Codeine Other (See Comments)    hallucinations   Vantin [Cefpodoxime] Nausea And Vomiting   Latex Itching   Baclofen Other (See Comments)    "makes cerebral palsy do adverse reaction on me", tightens muscles    Ciprofloxacin Itching and Nausea And Vomiting   Prednisone Other (See Comments)    Elevated blood glucose   Tape Rash    skin tears.  Paper tape is ok   Tramadol Nausea Only    Metabolic Disorder Labs: Lab Results  Component Value Date   HGBA1C 6.1 (H) 02/19/2015   No results found for: "PROLACTIN" No results found for: "CHOL", "TRIG", "HDL", "CHOLHDL", "VLDL", "LDLCALC" Lab Results  Component Value Date   TSH 2.198 07/10/2016  TSH 2.891 02/19/2015    Therapeutic Level Labs: No results found for: "LITHIUM" No results found for: "VALPROATE" No results found for: "CBMZ"  Current Medications: Current Outpatient Medications  Medication Sig Dispense Refill   acetaminophen (TYLENOL) 325 MG tablet Take 2 tablets (650 mg total) by mouth every 6 (six) hours as needed. 60 tablet 0   albuterol (PROVENTIL HFA;VENTOLIN HFA) 108 (90 BASE) MCG/ACT inhaler Inhale 2 puffs into the lungs 4 (four) times daily as needed. For shortness of breath and/or wheezing     ANORO ELLIPTA 62.5-25 MCG/INH AEPB      atorvastatin (LIPITOR) 20 MG tablet Take 1 tablet by mouth daily.     azelastine (ASTELIN) 0.1 % nasal spray Place into both nostrils.     Blood Glucose Monitoring Suppl (GLUCOCOM BLOOD GLUCOSE MONITOR) DEVI Test daily before all meals/snacks and once before bedtime.     budesonide-formoterol (SYMBICORT) 80-4.5 MCG/ACT inhaler Inhale 2 puffs up to 4 times daily as needed for symptoms of wheezing or shortness of breath.     Continuous Blood Gluc Receiver (DEXCOM G6 RECEIVER) DEVI 1  Units by Miscellaneous route continuous.     Continuous Blood Gluc Transmit (DEXCOM G6 TRANSMITTER) MISC 1 Units by Miscellaneous route Every three (3) months.     CREON 36000 units CPEP capsule      cyclobenzaprine (FLEXERIL) 10 MG tablet Take by mouth.     diazepam (VALIUM) 5 MG tablet Take by mouth.     dicyclomine (BENTYL) 10 MG capsule Take by mouth.     donepezil (ARICEPT) 10 MG tablet Take by mouth.     estradiol (ESTRACE) 0.1 MG/GM vaginal cream Place vaginally.     fluconazole (DIFLUCAN) 150 MG tablet Take 150 mg by mouth once.     FLUoxetine (PROZAC) 20 MG capsule Take 1 capsule (20 mg total) by mouth daily. Take along with 40 mg daily - total of 60 mg daily 90 capsule 1   FLUoxetine (PROZAC) 40 MG capsule Take 1 capsule (40 mg total) by mouth daily. Take along with 20 mg daily. 90 capsule 1   fluticasone (FLONASE) 50 MCG/ACT nasal spray 1 spray into each nostril two (2) times a day.     furosemide (LASIX) 40 MG tablet Take 40 mg by mouth daily as needed for fluid.      GLOBAL EASE INJECT PEN NEEDLES 31G X 5 MM MISC Inject into the skin.     glucose blood test strip USE TO TEST BLOOD SUGAR TWO TIMES A DAY     incobotulinumtoxinA (XEOMIN) 100 units SOLR injection Inject 100 Units into the muscle every 3 (three) months.      Incontinence Supply Disposable (PREVAIL BLADDER CONTROL PAD) MISC Large incontinence pads to go into underwear     insulin glargine (LANTUS) 100 UNIT/ML Solostar Pen Inject into the skin.     lamoTRIgine (LAMICTAL) 25 MG tablet TAKE 1 TABLET BY MOUTH ONCE DAILY 90 tablet 0   Lancets (FREESTYLE) lancets Test daily before all meals/snacks     LANTUS SOLOSTAR 100 UNIT/ML Solostar Pen Inject into the skin.     lidocaine (LIDODERM) 5 % Place onto the skin.     Melatonin 10 MG TBCR Take by mouth.     metFORMIN (GLUCOPHAGE-XR) 500 MG 24 hr tablet Take 1,000 mg by mouth 2 (two) times daily.     metoCLOPramide (REGLAN) 5 MG tablet Take 1 tablet (5 mg total) by mouth 4  (four) times daily -  before meals and  at bedtime for 14 days. 56 tablet 0   mirabegron ER (MYRBETRIQ) 25 MG TB24 tablet Take 1 tablet by mouth daily.     mirtazapine (REMERON) 15 MG tablet TAKE 1 TABLET BY MOUTH AT BEDTIME AS NEEDED FOR SLEEP 90 tablet 0   montelukast (SINGULAIR) 10 MG tablet Take 1 tablet by mouth daily.     naloxone (NARCAN) nasal spray 4 mg/0.1 mL One spray in either nostril once for known/suspected opioid overdose. May repeat every 2-3 minutes in alternating nostril til EMS arrives     nystatin (MYCOSTATIN/NYSTOP) powder Apply topically.     omeprazole (PRILOSEC) 40 MG capsule Take by mouth.     ondansetron (ZOFRAN) 4 MG tablet TAKE 1 TABLET BY MOUTH ONCE DAILY AS NEEDED FOR NAUSEA OR VOMITING 30 tablet 1   oxyCODONE-acetaminophen (PERCOCET) 10-325 MG tablet Take 1 tablet by mouth 3 (three) times daily as needed.     SUMAtriptan (IMITREX) 100 MG tablet Take 100 mg by mouth as directed.     XTAMPZA ER 13.5 MG C12A Take by mouth daily as needed.     No current facility-administered medications for this visit.     Musculoskeletal: Strength & Muscle Tone:  UTA Gait & Station:  Seated Patient leans: N/A  Psychiatric Specialty Exam: Review of Systems  Psychiatric/Behavioral:  The patient is nervous/anxious.     Last menstrual period 08/20/2014.There is no height or weight on file to calculate BMI.  General Appearance: Casual  Eye Contact:  Fair  Speech:  Clear and Coherent  Volume:  Normal  Mood:  Anxious  Affect:  Congruent  Thought Process:  Goal Directed and Descriptions of Associations: Intact  Orientation:  Full (Time, Place, and Person)  Thought Content: Logical   Suicidal Thoughts:  No  Homicidal Thoughts:  No  Memory:  Immediate;   Fair Recent;   Fair Remote;   Fair  Judgement:  Fair  Insight:  Fair  Psychomotor Activity:  Normal  Concentration:  Concentration: Fair and Attention Span: Fair  Recall:  [employer] of Knowledge: Fair  Language: Fair   Akathisia:  No  Handed:  Right  AIMS (if indicated): not done  Assets:  Communication Skills Desire for Improvement Housing Social Support  ADL's:  Intact  Cognition: WNL  Sleep:  Fair   Screenings: Geneticist, molecular Office Visit from 04/05/2022 in [facility] Office Visit from 10/16/2021 in [facility]  AIMS Total Score 0 0      GAD-7    Flowsheet Row Video Visit from 12/06/2022 in [facility] Office Visit from 04/05/2022 in [facility] Video Visit from 06/06/2021 in [facility]  Total GAD-7 Score 5 8 1       PHQ2-9    Flowsheet Row Video Visit from 12/06/2022 in [facility] Office Visit from 04/05/2022 in [facility] Video Visit from 11/13/2021 in [facility] Office Visit from 10/16/2021 in [facility] Video Visit from 06/06/2021 in [facility]  PHQ-2 Total Score 0 2 0 1 0  PHQ-9 Total Score -- 9 -- 5 --      Flowsheet Row Video Visit from 12/06/2022 in [facility] Video Visit from 06/21/2022 in [facility] Video Visit from 05/29/2022  in [facility]  C-SSRS RISK CATEGORY No Risk No Risk No Risk        Assessment and Plan: Traci Davis is a 52 year old Caucasian female who has a history of depression, anxiety, chronic pain was evaluated by telemedicine today.  Patient with anxiety likely due to her comorbid pain, physical limitations from the same, she will benefit from psychotherapy sessions, discussed medication management as noted below.  Plan MDD in  remission Prozac 60 mg p.o. daily Lamotrigine 25 mg p.o. daily  GAD-unstable Prozac 60 mg p.o. daily Will consider increasing the dosage of Prozac as needed in the future. Mirtazapine 15 mg p.o. nightly Patient advised to establish care with a therapist, may benefit from CBT/ACT.  Provider resources in the community. She will need sufficient pain management as well  Insomnia-improving Mirtazapine 15 mg p.o. nightly  Follow-up in clinic in 6 to 8 weeks or sooner if needed.  In person.  Collaboration of Care: Collaboration of Care: Referral or follow-up with counselor/therapist AEB patient advised to establish care with a therapist.  Patient/Guardian was advised Release of Information must be obtained prior to any record release in order to collaborate their care with an outside provider. Patient/Guardian was advised if they have not already done so to contact the registration department to sign all necessary forms in order for [place] to release information regarding their care.   Consent: Patient/Guardian gives verbal consent for treatment and assignment of benefits for services provided during this visit. Patient/Guardian expressed understanding and agreed to proceed.   This note was generated in part or whole with voice recognition software. Voice recognition is usually quite accurate but there are transcription errors that can and very often do occur. I apologize for any typographical errors that were not detected and corrected.    Jomarie Longs, MD 12/07/2022, 8:16 AM

## 2022-12-06 NOTE — Patient Instructions (Signed)
  [email or web address]  www.psychologytoday  [employer] [email or web address] [address] [place], [place] [address]   [phone]  [facility] [email or web address] [address] [place], [place] [address]  [phone]   [facility] - [phone]  [facility] - [phone]  [facility] - [phone] counseling [phone]  [facility] [phone]   [email or web address] this clinician can offer telehealth and has a sliding scale option  [email or web address] this group also offers sliding scale rates and is based out of [place]   [employer] [facility] has interns who offer sliding scale rates and some of the full time clinicians do, as well. You complete their contact form on their website and the referrals coordinator will help to get connected to someone   Medicaid below :  [facility] [address] [place], [place] [address]  [phone]    [employer] [address] [place], [place] [address]  [phone]    [facility] [address] [place], [place] [address]  [phone]

## 2022-12-31 ENCOUNTER — Other Ambulatory Visit: Payer: Self-pay | Admitting: Psychiatry

## 2022-12-31 DIAGNOSIS — G4701 Insomnia due to medical condition: Secondary | ICD-10-CM

## 2022-12-31 DIAGNOSIS — F411 Generalized anxiety disorder: Secondary | ICD-10-CM

## 2022-12-31 DIAGNOSIS — F3342 Major depressive disorder, recurrent, in full remission: Secondary | ICD-10-CM

## 2023-01-04 ENCOUNTER — Ambulatory Visit (INDEPENDENT_AMBULATORY_CARE_PROVIDER_SITE_OTHER): Payer: 59 | Admitting: Obstetrics & Gynecology

## 2023-01-04 ENCOUNTER — Encounter: Payer: Self-pay | Admitting: Obstetrics & Gynecology

## 2023-01-04 ENCOUNTER — Other Ambulatory Visit (HOSPITAL_COMMUNITY)
Admission: RE | Admit: 2023-01-04 | Discharge: 2023-01-04 | Disposition: A | Discharge status: Home / Self Care | Payer: 59 | Source: Ambulatory Visit | Attending: Obstetrics & Gynecology | Admitting: Obstetrics & Gynecology

## 2023-01-04 VITALS — Ht 62.0 in | Wt 200.0 lb

## 2023-01-04 DIAGNOSIS — Z01419 Encounter for gynecological examination (general) (routine) without abnormal findings: Secondary | ICD-10-CM

## 2023-01-04 DIAGNOSIS — Z1151 Encounter for screening for human papillomavirus (HPV): Secondary | ICD-10-CM | POA: Diagnosis present

## 2023-01-04 DIAGNOSIS — Z1239 Encounter for other screening for malignant neoplasm of breast: Secondary | ICD-10-CM

## 2023-01-04 DIAGNOSIS — Z124 Encounter for screening for malignant neoplasm of cervix: Secondary | ICD-10-CM

## 2023-01-04 DIAGNOSIS — N3945 Continuous leakage: Secondary | ICD-10-CM | POA: Insufficient documentation

## 2023-01-04 NOTE — Progress Notes (Signed)
ANNUAL PREVENTATIVE CARE GYNECOLOGY  ENCOUNTER NOTE  Subjective:       Traci Davis is a 52 y.o. single P43 (65 yo daughter and 1 grand) here for a routine annual gynecologic exam. The patient is not currently sexually active. The patient is not taking hormone replacement therapy. Patient denies post-menopausal vaginal bleeding. The patient wears seatbelts: yes. The patient participates in regular exercise: no. Has the patient ever been transfused or tattooed?: no. The patient reports that there is not domestic violence in her life.  She went through menopause around age 46. She occasionally uses vaginal estrogen, but not on a regular basis.  She reports that she saw a urologist about 6 months ago at [facility] in [place]. She says that he told her to speak with her gynecologist. She started wearing diapers about 5 years ago and reports involuntary urine loss during the day. She has DM and reports that she drinks "a lot". She also takes lasix 40 mg daily.   Gynecologic History Patient's last menstrual period was 08/20/2014.  Last Pap: 2019. Results were: normal Last mammogram: due Last Colonoscopy: 2023    Obstetric History OB History  No obstetric history on file.    Past Medical History:  Diagnosis Date   Acute pancreatitis 09/04/2014   Anxiety    Arthritis    Asthma    Cerebral palsy (HCC)    Complication of anesthesia    panic attacks before surgery   COPD (chronic obstructive pulmonary disease) (HCC)    Depression    Diabetes mellitus without complication (HCC)    GERD (gastroesophageal reflux disease)    Hypertension    Noninfectious gastroenteritis and colitis 01/10/2013    Family History  Problem Relation Age of Onset   CAD Father    Heart attack Brother    Sexual abuse Paternal Grandfather    Breast cancer Neg Hx     Past Surgical History:  Procedure Laterality Date   CESAREAN SECTION  1995   CHOLECYSTECTOMY     DILATATION & CURETTAGE/HYSTEROSCOPY WITH  MYOSURE N/A 02/20/2018   Procedure: DILATATION & CURETTAGE/HYSTEROSCOPY WITH MYOSURE ENDOMETRIAL POLYPECTOMY;  Surgeon: Conard Novak, MD;  Location: [facility] ORS;  Service: Gynecology;  Laterality: N/A;   ESOPHAGOGASTRODUODENOSCOPY (EGD) WITH PROPOFOL N/A 01/01/2019   Procedure: ESOPHAGOGASTRODUODENOSCOPY (EGD) WITH PROPOFOL;  Surgeon: Toney Reil, MD;  Location: [facility] ENDOSCOPY;  Service: Gastroenterology;  Laterality: N/A;   FOOT CAPSULE RELEASE W/ PERCUTANEOUS HEEL CORD LENGTHENING, TIBIAL TENDON TRANSFER     age 77 ,and 2010-both legs   JOINT REPLACEMENT Right    both knees partial replacements dates unknown   knee replacment     x 5 per pt. last one- left knee 2014. has had 2 on R 3 on L   TYMPANOPLASTY WITH GRAFT Right 01/08/2018   Procedure: TYMPANOPLASTY WITH POSSIBLE OSSICULAR CHAIN RECONSTRUCTION;  Surgeon: Geanie Logan, MD;  Location: [facility] ORS;  Service: ENT;  Laterality: Right;    Social History   Socioeconomic History   Marital status: Single    Spouse name: Not on file   Number of children: 1   Years of education: Not on file   Highest education level: Associate degree: occupational, Scientist, product/process development, or vocational program  Occupational History   Not on file  Tobacco Use   Smoking status: Never   Smokeless tobacco: Never  Vaping Use   Vaping status: Never Used  Substance and Sexual Activity   Alcohol use: No   Drug use: No  Sexual activity: Yes    Partners: Male    Birth control/protection: Condom  Other Topics Concern   Not on file  Social History Narrative   Not on file   Social Determinants of Health   Financial Resource Strain: Medium Risk (01/10/2022)   Received from [facility], [facility]   Overall Financial Resource Strain (CARDIA)    Difficulty of Paying Living Expenses: Somewhat hard  Food Insecurity: Food Insecurity Present (01/10/2022)   Received from [facility], [facility]   Hunger Vital Sign    Worried About Running Out of  Food in the Last Year: Sometimes true    Ran Out of Food in the Last Year: Never true  Transportation Needs: No Transportation Needs (01/10/2022)   Received from [facility], [facility]   PRAPARE - Transportation    Lack of Transportation (Medical): No    Lack of Transportation (Non-Medical): No  Physical Activity: Not on file  Stress: Not on file  Social Connections: Not on file  Intimate Partner Violence: Not on file    Current Outpatient Medications on File Prior to Visit  Medication Sig Dispense Refill   acetaminophen (TYLENOL) 325 MG tablet Take 2 tablets (650 mg total) by mouth every 6 (six) hours as needed. 60 tablet 0   albuterol (PROVENTIL HFA;VENTOLIN HFA) 108 (90 BASE) MCG/ACT inhaler Inhale 2 puffs into the lungs 4 (four) times daily as needed. For shortness of breath and/or wheezing     ANORO ELLIPTA 62.5-25 MCG/INH AEPB      atorvastatin (LIPITOR) 20 MG tablet Take 1 tablet by mouth daily.     azelastine (ASTELIN) 0.1 % nasal spray Place into both nostrils.     Blood Glucose Monitoring Suppl (GLUCOCOM BLOOD GLUCOSE MONITOR) DEVI Test daily before all meals/snacks and once before bedtime.     budesonide-formoterol (SYMBICORT) 80-4.5 MCG/ACT inhaler Inhale 2 puffs up to 4 times daily as needed for symptoms of wheezing or shortness of breath.     Continuous Blood Gluc Receiver (DEXCOM G6 RECEIVER) DEVI 1 Units by Miscellaneous route continuous.     Continuous Blood Gluc Transmit (DEXCOM G6 TRANSMITTER) MISC 1 Units by Miscellaneous route Every three (3) months.     CREON 36000 units CPEP capsule      cyclobenzaprine (FLEXERIL) 10 MG tablet Take by mouth.     diazepam (VALIUM) 5 MG tablet Take by mouth.     dicyclomine (BENTYL) 10 MG capsule Take by mouth.     donepezil (ARICEPT) 10 MG tablet Take by mouth.     estradiol (ESTRACE) 0.1 MG/GM vaginal cream Place vaginally.     fluconazole (DIFLUCAN) 150 MG tablet Take 150 mg by mouth once.     FLUoxetine (PROZAC) 20 MG  capsule Take 1 capsule (20 mg total) by mouth daily. Take along with 40 mg daily - total of 60 mg daily 90 capsule 1   FLUoxetine (PROZAC) 40 MG capsule Take 1 capsule (40 mg total) by mouth daily. Take along with 20 mg daily. 90 capsule 1   furosemide (LASIX) 40 MG tablet Take 40 mg by mouth daily as needed for fluid.      GLOBAL EASE INJECT PEN NEEDLES 31G X 5 MM MISC Inject into the skin.     glucose blood test strip USE TO TEST BLOOD SUGAR TWO TIMES A DAY     incobotulinumtoxinA (XEOMIN) 100 units SOLR injection Inject 100 Units into the muscle every 3 (three) months.  Incontinence Supply Disposable (PREVAIL BLADDER CONTROL PAD) MISC Large incontinence pads to go into underwear     lamoTRIgine (LAMICTAL) 25 MG tablet TAKE 1 TABLET BY MOUTH ONCE DAILY 90 tablet 0   Lancets (FREESTYLE) lancets Test daily before all meals/snacks     LANTUS SOLOSTAR 100 UNIT/ML Solostar Pen Inject into the skin.     lidocaine (LIDODERM) 5 % Place onto the skin.     Melatonin 10 MG TBCR Take by mouth.     metFORMIN (GLUCOPHAGE-XR) 500 MG 24 hr tablet Take 1,000 mg by mouth 2 (two) times daily.     mirtazapine (REMERON) 15 MG tablet TAKE 1 TABLET BY MOUTH AT BEDTIME AS NEEDED FOR SLEEP 90 tablet 0   naloxone (NARCAN) nasal spray 4 mg/0.1 mL One spray in either nostril once for known/suspected opioid overdose. May repeat every 2-3 minutes in alternating nostril til EMS arrives     nystatin (MYCOSTATIN/NYSTOP) powder Apply topically.     omeprazole (PRILOSEC) 40 MG capsule Take by mouth.     ondansetron (ZOFRAN) 4 MG tablet TAKE 1 TABLET BY MOUTH ONCE DAILY AS NEEDED FOR NAUSEA OR VOMITING 30 tablet 1   oxyCODONE-acetaminophen (PERCOCET) 10-325 MG tablet Take 1 tablet by mouth 3 (three) times daily as needed.     SUMAtriptan (IMITREX) 100 MG tablet Take 100 mg by mouth as directed.     XTAMPZA ER 13.5 MG C12A Take by mouth daily as needed.     fluticasone (FLONASE) 50 MCG/ACT nasal spray 1 spray into each  nostril two (2) times a day.     insulin glargine (LANTUS) 100 UNIT/ML Solostar Pen Inject into the skin.     metoCLOPramide (REGLAN) 5 MG tablet Take 1 tablet (5 mg total) by mouth 4 (four) times daily -  before meals and at bedtime for 14 days. 56 tablet 0   mirabegron ER (MYRBETRIQ) 25 MG TB24 tablet Take 1 tablet by mouth daily.     montelukast (SINGULAIR) 10 MG tablet Take 1 tablet by mouth daily.     No current facility-administered medications on file prior to visit.    Allergies  Allergen Reactions   Meloxicam Other (See Comments)    Damage kidney   Morphine And Codeine Other (See Comments)    hallucinations   Vantin [Cefpodoxime] Nausea And Vomiting   Latex Itching   Baclofen Other (See Comments)    "makes cerebral palsy do adverse reaction on me", tightens muscles    Ciprofloxacin Itching and Nausea And Vomiting   Prednisone Other (See Comments)    Elevated blood glucose   Tape Rash    skin tears.  Paper tape is ok   Tramadol Nausea Only      Review of Systems ROS Review of Systems - General ROS: negative for - chills, fatigue, fever, hot flashes, night sweats, weight gain or weight loss Psychological ROS: negative for - anxiety, decreased libido, depression, mood swings, physical abuse or sexual abuse Ophthalmic ROS: negative for - blurry vision, eye pain or loss of vision ENT ROS: negative for - headaches, hearing change, visual changes or vocal changes Allergy and Immunology ROS: negative for - hives, itchy/watery eyes or seasonal allergies Hematological and Lymphatic ROS: negative for - bleeding problems, bruising, swollen lymph nodes or weight loss Endocrine ROS: negative for - galactorrhea, hair pattern changes, hot flashes, malaise/lethargy, mood swings, palpitations, polydipsia/polyuria, skin changes, temperature intolerance or unexpected weight changes Breast ROS: negative for - new or changing breast lumps or nipple discharge  Respiratory ROS: negative for -  cough or shortness of breath Cardiovascular ROS: negative for - chest pain, irregular heartbeat, palpitations or shortness of breath Gastrointestinal ROS: no abdominal pain, change in bowel habits, or black or bloody stools Genito-Urinary ROS: no dysuria, trouble voiding, or hematuria Musculoskeletal ROS: negative for - joint pain or joint stiffness Neurological ROS: negative for - bowel and bladder control changes Dermatological ROS: negative for rash and skin lesion changes   Objective:   Ht 5\' 2"  (1.575 m)   Wt 200 lb (90.7 kg)   LMP 08/20/2014 Comment: missed cup when trying to obtain urine spec  BMI 36.58 kg/m  Well nourished, well hydrated White female, no apparent distress She is  conversing normally. She uses a leg brace and a walker. General:  alert   Breasts:  inspection negative, no nipple discharge or bleeding, no masses or nodularity palpable  Lungs: clear to auscultation bilaterally  Heart:  regular rate and rhythm, S1, S2 normal, no murmur, click, rub or gallop  Abdomen: soft, non-tender; bowel sounds normal; no masses,  no organomegaly, morbidly obese, centripital obesity   Vulva:  normal  Vagina: normal  Cervix:  I used a long Pederson spec. Due to the CP, her legs do not open. I was able to briefly visualize her cervix and was able to use a cytobrush to get a pap smear.  Bimanual:  Bimanual exam was non-contribultary due to her body habitus     Rectal Exam: Not performed.       Assessment:   1. Well woman exam with routine gynecological exam   2. Screening for cervical cancer   3. Encounter for breast cancer screening other than mammogram   4.      Incontinence/ urine seen in her vagina upon opening the speculum  Plan:  Pap smear obtained Mammogram ordered With regard to her incontinence and the finding of urine in her vagina on today's exam, I don't think that I can offer any treatment.  I offered her a referral to a urogynecologist, and she would like to  do that.  Return to Clinic - 1 Year   Allie Bossier, MD [facility]

## 2023-01-08 LAB — CYTOLOGY - PAP
Adequacy: ABSENT
Comment: NEGATIVE
Diagnosis: NEGATIVE
High risk HPV: NEGATIVE

## 2023-01-13 DIAGNOSIS — F411 Generalized anxiety disorder: Secondary | ICD-10-CM

## 2023-01-13 DIAGNOSIS — G4701 Insomnia due to medical condition: Secondary | ICD-10-CM

## 2023-01-13 DIAGNOSIS — F3342 Major depressive disorder, recurrent, in full remission: Secondary | ICD-10-CM

## 2023-01-15 ENCOUNTER — Other Ambulatory Visit: Payer: Self-pay | Admitting: Psychiatry

## 2023-01-15 DIAGNOSIS — F3342 Major depressive disorder, recurrent, in full remission: Secondary | ICD-10-CM

## 2023-01-15 DIAGNOSIS — F411 Generalized anxiety disorder: Secondary | ICD-10-CM

## 2023-01-15 DIAGNOSIS — G4701 Insomnia due to medical condition: Secondary | ICD-10-CM

## 2023-01-15 MED ORDER — MIRTAZAPINE 15 MG PO TABS: 22.5000 mg | ORAL_TABLET | Freq: Every day | ORAL | 1 refills | Status: DC

## 2023-01-15 NOTE — Telephone Encounter (Signed)
I have sent a higher dosage of mirtazapine to [facility] as requested by patient.

## 2023-01-16 ENCOUNTER — Ambulatory Visit: Payer: 59 | Admitting: Psychiatry

## 2023-01-17 ENCOUNTER — Emergency Department: Payer: 59

## 2023-01-17 ENCOUNTER — Emergency Department
Admission: EM | Admit: 2023-01-17 | Discharge: 2023-01-17 | Disposition: A | Discharge status: Home / Self Care | Payer: 59 | Attending: Emergency Medicine | Admitting: Emergency Medicine

## 2023-01-17 ENCOUNTER — Other Ambulatory Visit: Payer: Self-pay

## 2023-01-17 DIAGNOSIS — R0789 Other chest pain: Secondary | ICD-10-CM | POA: Diagnosis not present

## 2023-01-17 DIAGNOSIS — I10 Essential (primary) hypertension: Secondary | ICD-10-CM | POA: Insufficient documentation

## 2023-01-17 DIAGNOSIS — J449 Chronic obstructive pulmonary disease, unspecified: Secondary | ICD-10-CM | POA: Diagnosis not present

## 2023-01-17 DIAGNOSIS — E119 Type 2 diabetes mellitus without complications: Secondary | ICD-10-CM | POA: Diagnosis not present

## 2023-01-17 DIAGNOSIS — R079 Chest pain, unspecified: Secondary | ICD-10-CM | POA: Diagnosis present

## 2023-01-17 LAB — BASIC METABOLIC PANEL
Anion gap: 11 (ref 5–15)
BUN: 18 mg/dL (ref 6–20)
CO2: 25 mmol/L (ref 22–32)
Calcium: 8.9 mg/dL (ref 8.9–10.3)
Chloride: 99 mmol/L (ref 98–111)
Creatinine, Ser: 0.85 mg/dL (ref 0.44–1.00)
GFR, Estimated: >60 mL/min (ref >60)
Glucose, Bld: 165 mg/dL — ABNORMAL HIGH (ref 70–99)
Potassium: 3.9 mmol/L (ref 3.5–5.1)
Sodium: 135 mmol/L (ref 135–145)

## 2023-01-17 LAB — CBC
HCT: 40.7 % (ref 36.0–46.0)
Hemoglobin: 13.2 g/dL (ref 12.0–15.0)
MCH: 27.4 pg (ref 26.0–34.0)
MCHC: 32.4 g/dL (ref 30.0–36.0)
MCV: 84.4 fL (ref 80.0–100.0)
Platelets: 333 10*3/uL (ref 150–400)
RBC: 4.82 MIL/uL (ref 3.87–5.11)
RDW: 14.5 % (ref 11.5–15.5)
WBC: 11.6 10*3/uL — ABNORMAL HIGH (ref 4.0–10.5)
nRBC: 0 % (ref 0.0–0.2)

## 2023-01-17 LAB — TROPONIN I (HIGH SENSITIVITY)
Troponin I (High Sensitivity): 4 ng/L (ref <18)
Troponin I (High Sensitivity): 3 ng/L (ref <18)

## 2023-01-17 NOTE — ED Notes (Signed)
See triage notes. Patient c/o chest pain that radiates down the right arm. Pain is reproduceable with palpation.

## 2023-01-17 NOTE — ED Notes (Signed)
nitroglycerin paste removed per dr Scotty Court

## 2023-01-17 NOTE — ED Triage Notes (Signed)
[facility] for chest pain radiating to right arm. Reproduceable with palpation.  nitroglycerin paste, 324 ASA given PTA Denies shob,n/v

## 2023-01-17 NOTE — ED Provider Notes (Signed)
[facility] Provider Note    Event Date/Time   First MD Initiated Contact with Patient 01/17/23 1609     (approximate)   History   Chest Pain   HPI  Traci Davis is a 52 y.o. female with a history of anxiety, COPD, diabetes, hypertension who presents with complaints of chest pain.  Patient reports that she developed chest pain earlier today while at rest, symptoms have mainly resolved.  She thinks it may have been related to anxiety and stress given the situation.  No shortness of breath, no calf pain or swelling.  No fevers chills or cough     Physical Exam   Triage Vital Signs: ED Triage Vitals [01/17/23 1452]  Encounter Vitals Group     BP 125/85     Systolic BP Percentile      Diastolic BP Percentile      Pulse Rate 79     Resp (!) 22     Temp 98.6 F (37 C)     Temp src      SpO2 94 %     Weight 91 kg (200 lb 9.9 oz)     Height 1.575 m (5\' 2" )     Head Circumference      Peak Flow      Pain Score 6     Pain Loc      Pain Education      Exclude from Growth Chart     Most recent vital signs: Vitals:   01/17/23 1452  BP: 125/85  Pulse: 79  Resp: (!) 22  Temp: 98.6 F (37 C)  SpO2: 94%     General: Awake, no distress.  CV:  Good peripheral perfusion.  Resp:  Normal effort.  Abd:  No distention.  Other:     ED Results / Procedures / Treatments   Labs (all labs ordered are listed, but only abnormal results are displayed) Labs Reviewed  BASIC METABOLIC PANEL - Abnormal; Notable for the following components:      Result Value   Glucose, Bld 165 (*)    All other components within normal limits  CBC - Abnormal; Notable for the following components:   WBC 11.6 (*)    All other components within normal limits  TROPONIN I (HIGH SENSITIVITY)  TROPONIN I (HIGH SENSITIVITY)     EKG ED ECG REPORT I, Jene Every, the attending physician, personally viewed and interpreted this ECG.  Date: 01/17/2023  Rhythm: normal  sinus rhythm QRS Axis: normal Intervals: normal ST/T Wave abnormalities: normal Narrative Interpretation: no evidence of acute ischemia    RADIOLOGY Chest x-ray viewed interpret by me, no acute abnormality    PROCEDURES:  Critical Care performed:   Procedures   MEDICATIONS ORDERED IN ED: Medications - No data to display   IMPRESSION / MDM / ASSESSMENT AND PLAN / ED COURSE  I reviewed the triage vital signs and the nursing notes. Patient's presentation is most consistent with acute presentation with potential threat to life or bodily function.  Patient with a history of hypertension and high blood pressure presents with complaints of chest pain, now improved.  Overall well-appearing and in no acute distress, differential includes ACS, angina, GERD, stress  EKG and high sensitive troponin are reassuring, will send delta, lab work reviewed and overall is unremarkable, chest x-ray is benign.  Second troponin is normal.  Patient remains asymptomatic, appropriate for discharge at this time with outpatient follow-up, return precautions discussed, patient agrees with  this plan.        FINAL CLINICAL IMPRESSION(S) / ED DIAGNOSES   Final diagnoses:  Atypical chest pain     Rx / DC Orders   ED Discharge Orders     None        Note:  This document was prepared using Dragon voice recognition software and may include unintentional dictation errors.   Jene Every, MD 01/17/23 [phone]

## 2023-01-22 ENCOUNTER — Ambulatory Visit: Payer: 59 | Admitting: Psychiatry

## 2023-01-22 LAB — HEMOGLOBIN A1C: Hemoglobin A1C: 8

## 2023-01-23 ENCOUNTER — Ambulatory Visit: Payer: 59 | Admitting: Obstetrics

## 2023-01-23 ENCOUNTER — Other Ambulatory Visit: Payer: Self-pay | Admitting: Psychiatry

## 2023-01-23 DIAGNOSIS — F411 Generalized anxiety disorder: Secondary | ICD-10-CM

## 2023-01-23 DIAGNOSIS — F3342 Major depressive disorder, recurrent, in full remission: Secondary | ICD-10-CM

## 2023-02-06 ENCOUNTER — Encounter: Payer: Self-pay | Admitting: Family Medicine

## 2023-02-06 ENCOUNTER — Ambulatory Visit: Payer: 59 | Admitting: Family Medicine

## 2023-02-06 VITALS — BP 120/80 | HR 88 | Ht 62.0 in | Wt 200.0 lb

## 2023-02-06 DIAGNOSIS — J45998 Other asthma: Secondary | ICD-10-CM | POA: Insufficient documentation

## 2023-02-06 DIAGNOSIS — G801 Spastic diplegic cerebral palsy: Secondary | ICD-10-CM | POA: Diagnosis not present

## 2023-02-06 DIAGNOSIS — R109 Unspecified abdominal pain: Secondary | ICD-10-CM | POA: Diagnosis not present

## 2023-02-06 DIAGNOSIS — Z7984 Long term (current) use of oral hypoglycemic drugs: Secondary | ICD-10-CM

## 2023-02-06 DIAGNOSIS — J40 Bronchitis, not specified as acute or chronic: Secondary | ICD-10-CM

## 2023-02-06 DIAGNOSIS — I10 Essential (primary) hypertension: Secondary | ICD-10-CM

## 2023-02-06 DIAGNOSIS — E1169 Type 2 diabetes mellitus with other specified complication: Secondary | ICD-10-CM

## 2023-02-06 DIAGNOSIS — Z794 Long term (current) use of insulin: Secondary | ICD-10-CM

## 2023-02-06 DIAGNOSIS — B379 Candidiasis, unspecified: Secondary | ICD-10-CM

## 2023-02-06 MED ORDER — JARDIANCE 10 MG PO TABS
10.0000 mg | ORAL_TABLET | Freq: Every day | ORAL | 2 refills | Status: DC
Start: 2023-02-06 — End: 2023-02-27

## 2023-02-06 MED ORDER — DICYCLOMINE HCL 10 MG PO CAPS
30.0000 mg | ORAL_CAPSULE | Freq: Three times a day (TID) | ORAL | 1 refills | Status: DC
Start: 2023-02-06 — End: 2023-02-27

## 2023-02-06 MED ORDER — AZITHROMYCIN 250 MG PO TABS
ORAL_TABLET | ORAL | 0 refills | Status: DC
Start: 2023-02-06 — End: 2023-02-12

## 2023-02-06 MED ORDER — FLUCONAZOLE 150 MG PO TABS
ORAL_TABLET | ORAL | 0 refills | Status: DC
Start: 2023-02-06 — End: 2023-02-27

## 2023-02-06 MED ORDER — GLOBAL EASE INJECT PEN NEEDLES 31G X 5 MM MISC: 0 refills | Status: AC

## 2023-02-06 NOTE — Patient Instructions (Addendum)
Thank you for coming to the office today.  Start Azithromycin Z pak (antibiotic) 2 tabs day 1, then 1 tab x 4 days, complete entire course even if improved  Diflucan for yeast  Start Jardiance 10mg  daily, for blood sugar, can reduce fluid by increasing urination and it can possibly cause UTI  Contact us if need assistance with that.  If not covered we can figure out other med options  We may refer to our clinical pharmacist.  Refilled Dicyclomine.   Please schedule a Follow-up Appointment to: Return in about 3 months (around 05/09/2023) for 3 month DM A1c, updates from specialists (1st after lunch).  If you have any other questions or concerns, please feel free to call the office or send a message through MyChart. You may also schedule an earlier appointment if necessary.  Additionally, you may be receiving a survey about your experience at our office within a few days to 1 week by e-mail or mail. We value your feedback.  Saralyn Pilar, DO [facility], [place]

## 2023-02-06 NOTE — Progress Notes (Unsigned)
Subjective:    Patient ID: Traci Davis, female    DOB: [birth year], 52 y.o.   MRN: [id number]  Traci Davis is a 52 y.o. female presenting on 02/06/2023 for Establish Care (/)   HPI  Previous PCP [facility] Ad[facility]) She lives in [place] and has relocated to our office   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Followed by [facility] Dr Windle Guard - PM&R Chronic issue with LEFT lower extremity weakness deficit She receives Botox injections and valium AS NEEDED prior to injection She follows with outpatient [facility] physical therapy, often when she has any issue with difficulty walking. Usually 2 x per year. Normally she uses a stand up walker fore her forearm, but cannot use this now due to R hand injury. Also she has an Passenger transport manager power mobility device at home she uses regularly  She takes Furosemide 40mg  1-2 x as needed for leg swelling, worse with sedentary  Followed by [facility] On Oxycodone 10/325 mg regularly  Chest Congestion Worse with waking up past few days, has some chest discomfort and productive cough  Mild Cognitive Impairment Followed by Neurology Gavin Potters On Donepezil  Asthma,  Prior history COPD Followed by Pulmonology has done diagnostic.  Dysfunctional Digestion GERD She has eliminated spicy foods   Right Hand Surgery [facility] Dr Draeger Right carpal tunnel and tendonitis surgery She is limited with her mobility due to this now she cannot use R hand/wrist well for ambulation.  CHRONIC DM, Type 2: Diagnosed initially 2021 Previously A1c >11 She has worked on losing weight. Meds: Metformin XR 500mg  x 2 = 1000mg  TWICE A DAY, Lantus 30 units daily Reports  good compliance. Tolerating well w/o side-effects Denies hypoglycemia, polyuria, visual changes, numbness or tingling.   Health Maintenance: Updated her Flu vaccine 01/22/23     12/06/2022    3:34 PM 04/05/2022    4:17 PM 11/13/2021    3:44 PM  Depression  screen PHQ 2/9  Decreased Interest     Down, Depressed, Hopeless     PHQ - 2 Score     Altered sleeping     Tired, decreased energy     Change in appetite     Feeling bad or failure about yourself      Trouble concentrating     Moving slowly or fidgety/restless     Suicidal thoughts     PHQ-9 Score        Information is confidential and restricted. Go to Review Flowsheets to unlock data.    Past Medical History:  Diagnosis Date   Acute pancreatitis 09/04/2014   Anxiety    Arthritis    Asthma    Cerebral palsy (HCC)    Complication of anesthesia    panic attacks before surgery   COPD (chronic obstructive pulmonary disease) (HCC)    Depression    Diabetes mellitus without complication (HCC)    GERD (gastroesophageal reflux disease)    Hypertension    Noninfectious gastroenteritis and colitis 01/10/2013   Past Surgical History:  Procedure Laterality Date   CESAREAN SECTION  1995   CHOLECYSTECTOMY     DILATATION & CURETTAGE/HYSTEROSCOPY WITH MYOSURE N/A 02/20/2018   Procedure: DILATATION & CURETTAGE/HYSTEROSCOPY WITH MYOSURE ENDOMETRIAL POLYPECTOMY;  Surgeon: Conard Novak, MD;  Location: [facility] ORS;  Service: Gynecology;  Laterality: N/A;   ESOPHAGOGASTRODUODENOSCOPY (EGD) WITH PROPOFOL N/A 01/01/2019   Procedure: ESOPHAGOGASTRODUODENOSCOPY (EGD) WITH PROPOFOL;  Surgeon: Toney Reil, MD;  Location: [facility] ENDOSCOPY;  Service: Gastroenterology;  Laterality: N/A;   FOOT CAPSULE RELEASE W/ PERCUTANEOUS HEEL CORD LENGTHENING, TIBIAL TENDON TRANSFER     age 17 ,and 2010-both legs   JOINT REPLACEMENT Right    both knees partial replacements dates unknown   knee replacment     x 5 per pt. last one- left knee 2014. has had 2 on R 3 on L   TYMPANOPLASTY WITH GRAFT Right 01/08/2018   Procedure: TYMPANOPLASTY WITH POSSIBLE OSSICULAR CHAIN RECONSTRUCTION;  Surgeon: Geanie Logan, MD;  Location: [facility] ORS;  Service: ENT;  Laterality: Right;   Social History   Socioeconomic  History   Marital status: Single    Spouse name: Not on file   Number of children: 1   Years of education: Not on file   Highest education level: Associate degree: occupational, Scientist, product/process development, or vocational program  Occupational History   Not on file  Tobacco Use   Smoking status: Never   Smokeless tobacco: Never  Vaping Use   Vaping status: Never Used  Substance and Sexual Activity   Alcohol use: No   Drug use: No   Sexual activity: Yes    Partners: Male    Birth control/protection: Condom  Other Topics Concern   Not on file  Social History Narrative   Not on file   Social Determinants of Health   Financial Resource Strain: Medium Risk (01/10/2022)   Received from [facility], [facility]   Overall Financial Resource Strain (CARDIA)    Difficulty of Paying Living Expenses: Somewhat hard  Food Insecurity: Food Insecurity Present (01/10/2022)   [facility], [facility]   Hunger Vital Sign    Worried About Running Out of Food in the Last Year: Sometimes true    Ran Out of Food in the Last Year: Never true  Transportation Needs: No Transportation Needs (01/10/2022)   Received from [facility], [facility]   PRAPARE - Transportation    Lack of Transportation (Medical): No    Lack of Transportation (Non-Medical): No  Physical Activity: Not on file  Stress: Not on file  Social Connections: Not on file  Intimate Partner Violence: Not on file   Family History  Problem Relation Age of Onset   CAD Father    Heart attack Brother    Sexual abuse Paternal Grandfather    Breast cancer Neg Hx    Current Outpatient Medications on File Prior to Visit  Medication Sig   acetaminophen (TYLENOL) 325 MG tablet Take 2 tablets (650 mg total) by mouth every 6 (six) hours as needed.   albuterol (PROVENTIL HFA;VENTOLIN HFA) 108 (90 BASE) MCG/ACT inhaler Inhale 2 puffs into the lungs 4 (four) times daily as needed. For shortness of breath and/or wheezing    atorvastatin (LIPITOR) 20 MG tablet Take 1 tablet by mouth daily.   azelastine (ASTELIN) 0.1 % nasal spray Place into both nostrils.   budesonide-formoterol (SYMBICORT) 80-4.5 MCG/ACT inhaler Inhale 2 puffs up to 4 times daily as needed for symptoms of wheezing or shortness of breath.   CREON 36000 units CPEP capsule 3 capsules in the morning, at noon, and at bedtime.   cyclobenzaprine (FLEXERIL) 10 MG tablet Take 10 mg by mouth 3 (three) times daily.   diazepam (VALIUM) 5 MG tablet Take 5 mg by mouth. PRN with injecitons   donepezil (ARICEPT) 10 MG tablet Take 10 mg by mouth at bedtime.   FLUoxetine (PROZAC) 20 MG capsule Take 1 capsule (20 mg  total) by mouth daily. Take along with 40 mg daily - total of 60 mg daily   FLUoxetine (PROZAC) 40 MG capsule Take 1 capsule (40 mg total) by mouth daily. Take along with 20 mg daily.   fluticasone (FLONASE) 50 MCG/ACT nasal spray 1 spray into each nostril two (2) times a day.   furosemide (LASIX) 40 MG tablet Take 40 mg by mouth daily as needed for fluid. Taking twice a week   incobotulinumtoxinA (XEOMIN) 100 units SOLR injection Inject 100 Units into the muscle every 3 (three) months.    insulin glargine (LANTUS) 100 UNIT/ML Solostar Pen Inject 30 Units into the skin daily.   lamoTRIgine (LAMICTAL) 25 MG tablet TAKE 1 TABLET BY MOUTH ONCE DAILY   lidocaine (LIDODERM) 5 % Place 2 patches onto the skin daily.   metFORMIN (GLUCOPHAGE-XR) 500 MG 24 hr tablet Take 1,000 mg by mouth 2 (two) times daily.   metoCLOPramide (REGLAN) 5 MG tablet Take 1 tablet (5 mg total) by mouth 4 (four) times daily -  before meals and at bedtime for 14 days.   mirabegron ER (MYRBETRIQ) 25 MG TB24 tablet Take 1 tablet by mouth daily.   mirtazapine (REMERON) 15 MG tablet Take 1.5 tablets (22.5 mg total) by mouth at bedtime.   montelukast (SINGULAIR) 10 MG tablet Take 1 tablet by mouth daily.   nystatin (MYCOSTATIN/NYSTOP) powder Apply topically.   omeprazole (PRILOSEC) 40 MG  capsule Take 40 mg by mouth in the morning and at bedtime.   ondansetron (ZOFRAN) 4 MG tablet TAKE 1 TABLET BY MOUTH ONCE DAILY AS NEEDED FOR NAUSEA OR VOMITING   oxyCODONE-acetaminophen (PERCOCET) 10-325 MG tablet Take 1 tablet by mouth 3 (three) times daily as needed.   SUMAtriptan (IMITREX) 100 MG tablet Take 100 mg by mouth as directed.   XTAMPZA ER 13.5 MG C12A Take 1 capsule by mouth in the morning and at bedtime.   Blood Glucose Monitoring Suppl (GLUCOCOM BLOOD GLUCOSE MONITOR) DEVI Test daily before all meals/snacks and once before bedtime.   Continuous Blood Gluc Receiver (DEXCOM G6 RECEIVER) DEVI 1 Units by Miscellaneous route continuous.   Continuous Blood Gluc Transmit (DEXCOM G6 TRANSMITTER) MISC 1 Units by Miscellaneous route Every three (3) months.   GLOBAL EASE INJECT PEN NEEDLES 31G X 5 MM MISC Inject into the skin.   glucose blood test strip USE TO TEST BLOOD SUGAR TWO TIMES A DAY   Incontinence Supply Disposable (PREVAIL BLADDER CONTROL PAD) MISC Large incontinence pads to go into underwear   Lancets (FREESTYLE) lancets Test daily before all meals/snacks   naloxone (NARCAN) nasal spray 4 mg/0.1 mL One spray in either nostril once for known/suspected opioid overdose. May repeat every 2-3 minutes in alternating nostril til EMS arrives (Patient not taking: Reported on 02/06/2023)   No current facility-administered medications on file prior to visit.    Review of Systems Per HPI unless specifically indicated above      Objective:    BP 120/80   Pulse 88   Ht 5\' 2"  (1.575 m)   Wt 200 lb (90.7 kg) Comment: Patient reported, wheelchair, cannot stand today  LMP 08/20/2014 Comment: missed cup when trying to obtain urine spec  SpO2 96%   BMI 36.58 kg/m   Wt Readings from Last 3 Encounters:  02/06/23 200 lb (90.7 kg)  01/17/23 200 lb 9.9 oz (91 kg)  01/04/23 200 lb (90.7 kg)    Physical Exam Vitals and nursing note reviewed.  Constitutional:  General: She is not in  acute distress.    Appearance: Normal appearance. She is well-developed. She is obese. She is not diaphoretic.     Comments: Well-appearing, comfortable, cooperative  HENT:     Head: Normocephalic and atraumatic.  Eyes:     General:        Right eye: No discharge.        Left eye: No discharge.     Conjunctiva/sclera: Conjunctivae normal.  Cardiovascular:     Rate and Rhythm: Normal rate.  Pulmonary:     Effort: Pulmonary effort is normal.  Musculoskeletal:     Comments: Right hand wrapped in soft cast/wrap.   She is in wheelchair  Leg brace on Left lower extremity  Skin:    General: Skin is warm and dry.     Findings: No erythema or rash.  Neurological:     Mental Status: She is alert and oriented to person, place, and time.  Psychiatric:        Mood and Affect: Mood normal.        Behavior: Behavior normal.        Thought Content: Thought content normal.     Comments: Well groomed, good eye contact, normal speech and thoughts    Results for orders placed or performed during the hospital encounter of 01/17/23  Basic metabolic panel  Result Value Ref Range   Sodium 135 135 - 145 mmol/L   Potassium 3.9 3.5 - 5.1 mmol/L   Chloride 99 98 - 111 mmol/L   CO2 25 22 - 32 mmol/L   Glucose, Bld 165 (H) 70 - 99 mg/dL   BUN 18 6 - 20 mg/dL   Creatinine, Ser 1.61 0.44 - 1.00 mg/dL   Calcium 8.9 8.9 - 09.6 mg/dL   GFR, Estimated >04 >54 mL/min   Anion gap 11 5 - 15  CBC  Result Value Ref Range   WBC 11.6 (H) 4.0 - 10.5 K/uL   RBC 4.82 3.87 - 5.11 MIL/uL   Hemoglobin 13.2 12.0 - 15.0 g/dL   HCT 09.8 11.9 - 14.7 %   MCV 84.4 80.0 - 100.0 fL   MCH 27.4 26.0 - 34.0 pg   MCHC 32.4 30.0 - 36.0 g/dL   RDW 82.9 56.2 - 13.0 %   Platelets 333 150 - 400 K/uL   nRBC 0.0 0.0 - 0.2 %  Troponin I (High Sensitivity)  Result Value Ref Range   Troponin I (High Sensitivity) 4 <18 ng/L  Troponin I (High Sensitivity)  Result Value Ref Range   Troponin I (High Sensitivity) 3 <18 ng/L       Assessment & Plan:   Problem List Items Addressed This Visit     Asthma, persistent controlled   Essential hypertension - Primary   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(HCC)   Type 2 diabetes mellitus with other specified complication (HCC)   Relevant Medications   JARDIANCE 10 MG TABS tablet   Other Visit Diagnoses     Abdominal cramping       Relevant Medications   dicyclomine (BENTYL) 10 MG capsule   Bronchitis       Relevant Medications   azithromycin (ZITHROMAX Z-PAK) 250 MG tablet   Antibiotic-induced yeast infection       Relevant Medications   azithromycin (ZITHROMAX Z-PAK) 250 MG tablet   fluconazole (DIFLUCAN) 150 MG tablet       Assessment and Plan    Chest Congestion Morning phlegm and chest congestion, possibly related to  recent flu shot. No wheezing or fluid noted on examination. -Start Azithromycin (Z-Pak) and Fluconazole (Diflucan) to prevent yeast infection secondary to antibiotic use.  Diabetes Mellitus HbA1c of 8.0 two weeks ago. Currently on Metformin and Lantus. -Add Jardiance 10mg  daily to current regimen. Monitor for potential UTI and yeast infection. -Continue Metformin and Lantus.  Cerebral Palsy Predominantly affecting lower extremities, managed by specialist with Botox injections and Valium pre-procedure. Recent right hand weakness. -Continue current management with specialist.  Asthma Previously misdiagnosed as COPD. Currently on Symbicort and Albuterol. -Continue current management.  Post-Operative Hand Surgery Recent right hand surgery for carpal tunnel and bursitis. Currently in physical therapy. -Continue current management with specialist.  General Health Maintenance -Flu shot completed on 01/22/2023. -Pneumonia shot status to be confirmed. -Refill Dicyclomine (Bentyl) 10mg , 90-day supply, 1 pill three times a day. -Follow-up appointment in 3 months.        Meds ordered this encounter  Medications   dicyclomine (BENTYL) 10 MG  capsule    Sig: Take 3 capsules (30 mg total) by mouth 3 (three) times daily before meals.    Dispense:  270 capsule    Refill:  1   JARDIANCE 10 MG TABS tablet    Sig: Take 1 tablet (10 mg total) by mouth daily before breakfast.    Dispense:  30 tablet    Refill:  2   azithromycin (ZITHROMAX Z-PAK) 250 MG tablet    Sig: Take 2 tabs (500mg  total) on Day 1. Take 1 tab (250mg ) daily for next 4 days.    Dispense:  6 tablet    Refill:  0   fluconazole (DIFLUCAN) 150 MG tablet    Sig: Take one tablet by mouth on Day 1. Repeat dose 2nd tablet on Day 3.    Dispense:  2 tablet    Refill:  0      Follow up plan: Return in about 3 months (around 05/09/2023) for 3 month DM A1c, updates from specialists (1st after lunch).  Saralyn Pilar, DO [facility] 02/06/2023, 10:53 AM

## 2023-02-07 ENCOUNTER — Encounter: Payer: Self-pay | Admitting: Family Medicine

## 2023-02-11 NOTE — Telephone Encounter (Signed)
Can you please contact patient back and clarify her concern about discontinuing medications?  I just met her for initial visit. Please let her know that I cannot reduce or discontinue a lot of medicines day 1. This takes time to follow up and review meds and discuss.   Also, If possible can you let her know that this is not a proper use of MyChart and see if we can help her by phone, and either a sooner apt or keep her 3 month visit which is in January.  Saralyn Pilar, DO [facility] 02/11/2023, 7:31 PM

## 2023-02-12 ENCOUNTER — Telehealth (INDEPENDENT_AMBULATORY_CARE_PROVIDER_SITE_OTHER): Payer: 59 | Admitting: Psychiatry

## 2023-02-12 ENCOUNTER — Encounter: Payer: Self-pay | Admitting: Psychiatry

## 2023-02-12 DIAGNOSIS — F3342 Major depressive disorder, recurrent, in full remission: Secondary | ICD-10-CM

## 2023-02-12 DIAGNOSIS — F411 Generalized anxiety disorder: Secondary | ICD-10-CM

## 2023-02-12 DIAGNOSIS — G4701 Insomnia due to medical condition: Secondary | ICD-10-CM | POA: Diagnosis not present

## 2023-02-12 DIAGNOSIS — R52 Pain, unspecified: Secondary | ICD-10-CM

## 2023-02-12 MED ORDER — LAMOTRIGINE 25 MG PO TABS
25.0000 mg | ORAL_TABLET | Freq: Every day | ORAL | 0 refills | Status: DC
Start: 2023-02-12 — End: 2023-07-01

## 2023-02-12 MED ORDER — MIRTAZAPINE 15 MG PO TABS
22.5000 mg | ORAL_TABLET | Freq: Every day | ORAL | 1 refills | Status: DC
Start: 2023-02-12 — End: 2023-09-30

## 2023-02-12 MED ORDER — BUSPIRONE HCL 10 MG PO TABS
10.0000 mg | ORAL_TABLET | Freq: Three times a day (TID) | ORAL | 1 refills | Status: DC
Start: 2023-02-12 — End: 2023-06-10

## 2023-02-12 NOTE — Progress Notes (Signed)
Virtual Visit via Video Note  I connected with Traci Davis on 02/12/23 at  1:00 PM EDT by a video enabled telemedicine application and verified that I am speaking with the correct person using two identifiers.  Location Provider Location : [facility] Patient Location : Home  Participants: Patient , Daughter,Provider   I discussed the limitations of evaluation and management by telemedicine and the availability of in person appointments. The patient expressed understanding and agreed to proceed.   I discussed the assessment and treatment plan with the patient. The patient was provided an opportunity to ask questions and all were answered. The patient agreed with the plan and demonstrated an understanding of the instructions.   The patient was advised to call back or seek an in-person evaluation if the symptoms worsen or if the condition fails to improve as anticipated.   BH MD OP Progress Note  02/12/2023 3:07 PM Traci Davis  MRN:  [id number]  Chief Complaint:  Chief Complaint  Patient presents with   Follow-up   Anxiety   Medication Refill   Depression   HPI: Traci Davis is a 52 year old Caucasian female, lives in [facility], has a history of MDD, GAD, insomnia, cerebral palsy, diabetes mellitus on SSD was evaluated by telemedicine today.  Patient was supposed to come in for an in office visit however contacted the office stating that she just had surgery of her wrist and hence wanted this appointment to be changed to a telemedicine appointment.  Patient today appeared to be in a lot of pain and discomfort.  Patient reports she has difficulty walking because she just had her hand surgery for carpal tunnel and bursitis on September 27.  She reports she has to depend on others to get up and walk as well as take care of her activities of daily living.  Patient reports she also has been having falls since she needs help to get from 1 place to another and is also having trouble with  her legs likely from neuropathy.  She agrees to follow up with her primary care provider.  Reports she establish care with a new provider-Dr. Althea Charon and has upcoming appointment.  Patient reports although she does not feel depressed at this time she is currently anxious because of her current situational stressors.  She reports she has no control over her anxiety.  She has not been able to establish care with the therapist as discussed last visit.  She does not know if she needs one at this time.  She is interested in medication management, agreeable to addition of BuSpar.  Continues to be compliant on the Lamictal, Prozac, mirtazapine.  Patient reports the mirtazapine at this dosage helps with sleep.  Denies side effects.  Patient denies any suicidality, homicidality or perceptual disturbances.  Patient denies any other concerns today.  Visit Diagnosis:    ICD-10-CM   1. MDD (major depressive disorder), recurrent, in full remission (HCC)  F33.42     2. GAD (generalized anxiety disorder)  F41.1 busPIRone (BUSPAR) 10 MG tablet    lamoTRIgine (LAMICTAL) 25 MG tablet    3. Insomnia due to medical condition  G47.01 mirtazapine (REMERON) 15 MG tablet   pain, mood      Past Psychiatric History: I have reviewed past psychiatric history from progress note on 09/12/2017.  Past trials of Xanax, Klonopin, Cymbalta, Rozerem, Ambien, trazodone, doxepin  Past Medical History:  Past Medical History:  Diagnosis Date   Acute pancreatitis 09/04/2014   Anxiety  Arthritis    Asthma    Cerebral palsy (HCC)    Complication of anesthesia    panic attacks before surgery   COPD (chronic obstructive pulmonary disease) (HCC)    Depression    Diabetes mellitus without complication (HCC)    GERD (gastroesophageal reflux disease)    Hypertension    Noninfectious gastroenteritis and colitis 01/10/2013    Past Surgical History:  Procedure Laterality Date   CESAREAN SECTION  1995   CHOLECYSTECTOMY      DILATATION & CURETTAGE/HYSTEROSCOPY WITH MYOSURE N/A 02/20/2018   Procedure: DILATATION & CURETTAGE/HYSTEROSCOPY WITH MYOSURE ENDOMETRIAL POLYPECTOMY;  Surgeon: Conard Novak, MD;  Location: [facility] ORS;  Service: Gynecology;  Laterality: N/A;   ESOPHAGOGASTRODUODENOSCOPY (EGD) WITH PROPOFOL N/A 01/01/2019   Procedure: ESOPHAGOGASTRODUODENOSCOPY (EGD) WITH PROPOFOL;  Surgeon: Toney Reil, MD;  Location: [facility] ENDOSCOPY;  Service: Gastroenterology;  Laterality: N/A;   FOOT CAPSULE RELEASE W/ PERCUTANEOUS HEEL CORD LENGTHENING, TIBIAL TENDON TRANSFER     age 52 ,and 2010-both legs   JOINT REPLACEMENT Right    both knees partial replacements dates unknown   knee replacment     x 5 per pt. last one- left knee 2014. has had 2 on R 3 on L   TYMPANOPLASTY WITH GRAFT Right 01/08/2018   Procedure: TYMPANOPLASTY WITH POSSIBLE OSSICULAR CHAIN RECONSTRUCTION;  Surgeon: Geanie Logan, MD;  Location: [facility] ORS;  Service: ENT;  Laterality: Right;    Family Psychiatric History: Reviewed family psychiatric history from progress note on 09/12/2017.  Family History:  Family History  Problem Relation Age of Onset   CAD Father    Heart attack Brother    Sexual abuse Paternal Grandfather    Breast cancer Neg Hx     Social History: Reviewed social history from progress note on 09/12/2017. Social History   Socioeconomic History   Marital status: Single    Spouse name: Not on file   Number of children: 1   Years of education: Not on file   Highest education level: Associate degree: occupational, Scientist, product/process development, or vocational program  Occupational History   Not on file  Tobacco Use   Smoking status: Never   Smokeless tobacco: Never  Vaping Use   Vaping status: Never Used  Substance and Sexual Activity   Alcohol use: No   Drug use: No   Sexual activity: Yes    Partners: Male    Birth control/protection: Condom  Other Topics Concern   Not on file  Social History Narrative   Not on file    Social Determinants of Health   Financial Resource Strain: Medium Risk (01/10/2022)   [facility], [facility]   Overall Financial Resource Strain (CARDIA)    Difficulty of Paying Living Expenses: Somewhat hard  Food Insecurity: Food Insecurity Present (01/10/2022)   Received from [facility], [facility]   Hunger Vital Sign    Worried About Running Out of Food in the Last Year: Sometimes true    Ran Out of Food in the Last Year: Never true  Transportation Needs: No Transportation Needs (01/10/2022)   Received from [facility], [facility]   PRAPARE - Transportation    Lack of Transportation (Medical): No    Lack of Transportation (Non-Medical): No  Physical Activity: Not on file  Stress: Not on file  Social Connections: Not on file    Allergies:  Allergies  Allergen Reactions   Meloxicam Other (See Comments)    Damage kidney  Morphine And Codeine Other (See Comments)    hallucinations   Vantin [Cefpodoxime] Nausea And Vomiting   Latex Itching   Baclofen Other (See Comments)    "makes cerebral palsy do adverse reaction on me", tightens muscles    Ciprofloxacin Itching and Nausea And Vomiting   Prednisone Other (See Comments)    Elevated blood glucose   Tape Rash    skin tears.  Paper tape is ok   Tramadol Nausea Only    Metabolic Disorder Labs: Lab Results  Component Value Date   HGBA1C 8 01/22/2023   No results found for: "PROLACTIN" No results found for: "CHOL", "TRIG", "HDL", "CHOLHDL", "VLDL", "LDLCALC" Lab Results  Component Value Date   TSH 2.198 07/10/2016   TSH 2.891 02/19/2015    Therapeutic Level Labs: No results found for: "LITHIUM" No results found for: "VALPROATE" No results found for: "CBMZ"  Current Medications: Current Outpatient Medications  Medication Sig Dispense Refill   acetaminophen (TYLENOL) 325 MG tablet Take 2 tablets (650 mg total) by mouth every 6 (six) hours as needed. 60 tablet 0    albuterol (PROVENTIL HFA;VENTOLIN HFA) 108 (90 BASE) MCG/ACT inhaler Inhale 2 puffs into the lungs 4 (four) times daily as needed. For shortness of breath and/or wheezing     atorvastatin (LIPITOR) 20 MG tablet Take 1 tablet by mouth daily.     azelastine (ASTELIN) 0.1 % nasal spray Place into both nostrils.     Blood Glucose Monitoring Suppl (GLUCOCOM BLOOD GLUCOSE MONITOR) DEVI Test daily before all meals/snacks and once before bedtime.     budesonide-formoterol (SYMBICORT) 80-4.5 MCG/ACT inhaler Inhale 2 puffs up to 4 times daily as needed for symptoms of wheezing or shortness of breath.     busPIRone (BUSPAR) 10 MG tablet Take 1 tablet (10 mg total) by mouth 3 (three) times daily. 90 tablet 1   Continuous Blood Gluc Receiver (DEXCOM G6 RECEIVER) DEVI 1 Units by Miscellaneous route continuous.     Continuous Blood Gluc Transmit (DEXCOM G6 TRANSMITTER) MISC 1 Units by Miscellaneous route Every three (3) months.     Continuous Glucose Sensor (DEXCOM G7 SENSOR) MISC Apply Dexcom G7 sensor every 10 days. Type 2 Diabetes uncontrolled (E11.65)     CREON 36000 units CPEP capsule 3 capsules in the morning, at noon, and at bedtime.     cyclobenzaprine (FLEXERIL) 10 MG tablet Take 10 mg by mouth 3 (three) times daily.     diazepam (VALIUM) 5 MG tablet Take 5 mg by mouth. PRN with injecitons     dicyclomine (BENTYL) 10 MG capsule Take 3 capsules (30 mg total) by mouth 3 (three) times daily before meals. 270 capsule 1   Docusate Sodium (DSS) 100 MG CAPS Take by mouth.     donepezil (ARICEPT) 10 MG tablet Take 10 mg by mouth at bedtime.     fluconazole (DIFLUCAN) 150 MG tablet Take one tablet by mouth on Day 1. Repeat dose 2nd tablet on Day 3. 2 tablet 0   FLUoxetine (PROZAC) 20 MG capsule Take 1 capsule (20 mg total) by mouth daily. Take along with 40 mg daily - total of 60 mg daily 90 capsule 1   FLUoxetine (PROZAC) 40 MG capsule Take 1 capsule (40 mg total) by mouth daily. Take along with 20 mg daily. 90  capsule 1   fluticasone (FLONASE) 50 MCG/ACT nasal spray 1 spray into each nostril two (2) times a day.     furosemide (LASIX) 40 MG tablet Take 40 mg by  mouth daily as needed for fluid. Taking twice a week     gabapentin (NEURONTIN) 300 MG capsule Take by mouth.     glucose blood test strip USE TO TEST BLOOD SUGAR TWO TIMES A DAY     incobotulinumtoxinA (XEOMIN) 100 units SOLR injection Inject 100 Units into the muscle every 3 (three) months.      Incontinence Supply Disposable (PREVAIL BLADDER CONTROL PAD) MISC Large incontinence pads to go into underwear     insulin glargine (LANTUS) 100 UNIT/ML Solostar Pen Inject 30 Units into the skin daily.     Insulin Pen Needle (GLOBAL EASE INJECT PEN NEEDLES) 31G X 5 MM MISC Use pen needle to inject insulin daily. 100 each 0   JARDIANCE 10 MG TABS tablet Take 1 tablet (10 mg total) by mouth daily before breakfast. 30 tablet 2   Lancets (FREESTYLE) lancets Test daily before all meals/snacks     lidocaine (LIDODERM) 5 % Place 2 patches onto the skin daily.     metFORMIN (GLUCOPHAGE-XR) 500 MG 24 hr tablet Take 1,000 mg by mouth 2 (two) times daily.     metoCLOPramide (REGLAN) 5 MG tablet Take 1 tablet (5 mg total) by mouth 4 (four) times daily -  before meals and at bedtime for 14 days. 56 tablet 0   mirabegron ER (MYRBETRIQ) 25 MG TB24 tablet Take 1 tablet by mouth daily.     naloxone (NARCAN) nasal spray 4 mg/0.1 mL      nystatin (MYCOSTATIN/NYSTOP) powder Apply topically.     omeprazole (PRILOSEC) 40 MG capsule Take 40 mg by mouth in the morning and at bedtime.     ondansetron (ZOFRAN) 4 MG tablet TAKE 1 TABLET BY MOUTH ONCE DAILY AS NEEDED FOR NAUSEA OR VOMITING 30 tablet 1   oxyCODONE-acetaminophen (PERCOCET) 10-325 MG tablet Take 1 tablet by mouth 3 (three) times daily as needed.     SUMAtriptan (IMITREX) 100 MG tablet Take 100 mg by mouth as directed.     XTAMPZA ER 13.5 MG C12A Take 1 capsule by mouth in the morning and at bedtime.      lamoTRIgine (LAMICTAL) 25 MG tablet Take 1 tablet (25 mg total) by mouth daily. 90 tablet 0   mirtazapine (REMERON) 15 MG tablet Take 1.5 tablets (22.5 mg total) by mouth at bedtime. 135 tablet 1   montelukast (SINGULAIR) 10 MG tablet Take 1 tablet by mouth daily.     No current facility-administered medications for this visit.     Musculoskeletal: Strength & Muscle Tone:  UTA Gait & Station:  Seated Patient leans: N/A  Psychiatric Specialty Exam: Review of Systems  Musculoskeletal:        Rt.sided wrist ,s/p surgery   Psychiatric/Behavioral:  The patient is nervous/anxious.     Last menstrual period 08/20/2014.There is no height or weight on file to calculate BMI.  General Appearance: Casual  Eye Contact:  Fair  Speech:  Clear and Coherent  Volume:  Normal  Mood:  Anxious  Affect:  Congruent  Thought Process:  Goal Directed and Descriptions of Associations: Intact  Orientation:  Full (Time, Place, and Person)  Thought Content: Logical   Suicidal Thoughts:  No  Homicidal Thoughts:  No  Memory:  Immediate;   Fair Recent;   Fair Remote;   Fair  Judgement:  Fair  Insight:  Fair  Psychomotor Activity:  Normal  Concentration:  Concentration: Fair and Attention Span: Fair  Recall:  [employer] of Knowledge: Fair  Language: Fair  Akathisia:  No  Handed:  Right  AIMS (if indicated): not done  Assets:  Communication Skills Desire for Improvement Housing Social Support  ADL's:  Intact  Cognition: WNL  Sleep:  Fair   Screenings: Midwife Visit from 04/05/2022 in [facility] Office Visit from 10/16/2021 in [facility]  AIMS Total Score 0 0      GAD-7    Flowsheet Row Video Visit from 12/06/2022 in [facility] Office Visit from 04/05/2022 in [facility] Video Visit from 06/06/2021 in [facility]  Total GAD-7 Score 5 8 1       PHQ2-9    Flowsheet Row Video Visit from 12/06/2022 in [facility] Office Visit from 04/05/2022 in [facility] Video Visit from 11/13/2021 in [facility] Office Visit from 10/16/2021 in [facility] Video Visit from 06[facility]06/2021 in [facility]  PHQ-2 Total Score 0 2 0 1 0  PHQ-9 Total Score -- 9 -- 5 --      Flowsheet Row Video Visit from 02/12/2023 in [facility] ED from 01/17/2023 in [facility] at [facility] Video Visit from 12/06/2022 in [facility]  C-SSRS RISK CATEGORY No Risk No Risk No Risk        Assessment and Plan: Traci Davis is a 52 year old Caucasian female who has a history of depression, anxiety, chronic pain was evaluated by telemedicine today.  Patient is currently struggling with multiple medical problems including pain and discomfort status post hand surgery-right sided for carpal tunnel and bursitis, patient will benefit from medication management as well as psychotherapy session for worsening anxiety, plan as noted below.  Plan MDD in remission Prozac 60 mg p.o. daily Lamictal 25 mg p.o. daily   GAD-unstable Prozac 60 mg p.o. daily We will consider tapering of Prozac in the future. Start BuSpar 10 mg p.o. 3 times daily Continue mirtazapine 22.5 mg p.o. nightly Discussed to establish care with a therapist.  Patient declines.  Insomnia-stable Mirtazapine 22.5 mg p.o. nightly  Collateral information obtained from daughter who was present in session.      Collaboration of Care: Collaboration of Care: Patient refused AEB patient declines referral for therapy  Patient/Guardian was  advised Release of Information must be obtained prior to any record release in order to collaborate their care with an outside provider. Patient/Guardian was advised if they have not already done so to contact the registration department to sign all necessary forms in order for [place] to release information regarding their care.   Consent: Patient/Guardian gives verbal consent for treatment and assignment of benefits for services provided during this visit. Patient/Guardian expressed understanding and agreed to proceed.   Follow-up in clinic in 8 weeks or sooner in person.  This note was generated in part or whole with voice recognition software. Voice recognition is usually quite accurate but there are transcription errors that can and very often do occur. I apologize for any typographical errors that were not detected and corrected.    Jomarie Longs, MD 02/12/2023, 3:07 PM

## 2023-02-13 ENCOUNTER — Other Ambulatory Visit: Payer: Self-pay

## 2023-02-13 MED ORDER — ATORVASTATIN CALCIUM 20 MG PO TABS: 20.0000 mg | ORAL_TABLET | Freq: Every day | ORAL | 3 refills | Status: DC

## 2023-02-18 ENCOUNTER — Ambulatory Visit: Payer: 59 | Admitting: Gastroenterology

## 2023-02-27 ENCOUNTER — Encounter: Payer: Self-pay | Admitting: Family Medicine

## 2023-02-27 ENCOUNTER — Ambulatory Visit (INDEPENDENT_AMBULATORY_CARE_PROVIDER_SITE_OTHER): Payer: 59 | Admitting: Family Medicine

## 2023-02-27 VITALS — BP 144/80 | Ht 62.0 in | Wt 204.0 lb

## 2023-02-27 DIAGNOSIS — R682 Dry mouth, unspecified: Secondary | ICD-10-CM | POA: Diagnosis not present

## 2023-02-27 DIAGNOSIS — J45998 Other asthma: Secondary | ICD-10-CM

## 2023-02-27 DIAGNOSIS — B370 Candidal stomatitis: Secondary | ICD-10-CM

## 2023-02-27 DIAGNOSIS — K146 Glossodynia: Secondary | ICD-10-CM | POA: Diagnosis not present

## 2023-02-27 DIAGNOSIS — E1169 Type 2 diabetes mellitus with other specified complication: Secondary | ICD-10-CM | POA: Diagnosis not present

## 2023-02-27 DIAGNOSIS — Z794 Long term (current) use of insulin: Secondary | ICD-10-CM

## 2023-02-27 MED ORDER — JARDIANCE 10 MG PO TABS
10.0000 mg | ORAL_TABLET | Freq: Every day | ORAL | 1 refills | Status: DC
Start: 2023-02-27 — End: 2023-05-21

## 2023-02-27 MED ORDER — GVOKE HYPOPEN 2-PACK 1 MG/0.2ML ~~LOC~~ SOAJ
1.0000 mg | SUBCUTANEOUS | 0 refills | Status: AC | PRN
Start: 2023-02-27 — End: ?

## 2023-02-27 MED ORDER — MAGIC MOUTHWASH W/LIDOCAINE
ORAL | 0 refills | Status: DC
Start: 2023-02-27 — End: 2023-04-02

## 2023-02-27 NOTE — Progress Notes (Signed)
Subjective:    Patient ID: Traci Davis, female    DOB: [birth year], 52 y.o.   MRN: [id number]  Traci Davis is a 52 y.o. female presenting on 02/27/2023 for Mouth Lesions   HPI  Discussed the use of AI scribe software for clinical note transcription with the patient, who gave verbal consent to proceed.     Oral Soreness / Tongue Fissures / Pain  The patient, with a history of asthma and diabetes, presents with a chief complaint of oral discomfort, specifically a sore tongue. The patient attributes this discomfort to the use of the Symbicort inhaler, which she uses on an as-needed basis, typically once or twice a week. The patient reports a sensation of dryness and pain on the side of the tongue, extending from the back to the front. She has been using a oral debridement mouth sore mouthwash she purchased OTC with some relief.  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Followed by [facility] Dr Windle Guard - PM&R Chronic issue with LEFT lower extremity weakness deficit She receives Botox injections and valium AS NEEDED prior to injection She follows with outpatient [facility] physical therapy, often when she has any issue with difficulty walking. Usually 2 x per year. Normally she uses a stand up walker fore her forearm, but cannot use this now due to R hand injury. Also she has an Passenger transport manager power mobility device at home she uses regularly She was recently started on Gabapentin which has improved nerve and muscle pain in her legs related to CP spasms. She continues on Flexeril 10mg  THREE TIMES A DAY. Diazepam is only for botox injections     Asthma [facility], previously 11/2021 advised to use Albuterol and Symbicort as needed, AS NEEDED basis with Symbicort instead of daily use based on her current status Taking Singulair 10mg  to control asthma - Currently using Symbicort 2 puffs twice per week, believes it can irritate her mouth  Order printed Magic Mouthwash only if  needed  CHRONIC DM, Type 2: Last A1c 8 Improved since last visit on Jardiance, better CBG control 150 or less Meds: Metformin XR 500mg  x 2 = 1000mg  TWICE A DAY, Lantus 30 units daily, Jardiance 10mg  daily Reports  good compliance. Tolerating well w/o side-effects Admits Hypoglycemia recently since addition of Jardiance 10mg  Denies hypoglycemia, polyuria, visual changes, numbness or tingling.  GI Dysfunction The patient also reports a history of abdominal cramping, for which she was prescribed dicyclomine. However, she has identified that the cramping was related to her consumption of spicy foods, and since making dietary changes, she has not been experiencing this symptom. She expresses a desire to discontinue this medication. - She will DC or pause Dicyclomine + Zofran AS NEEDED only  Lastly, the patient reports a history of urinary issues, for which she is taking Myrbetriq. She is awaiting an appointment with a urogynecologist for further evaluation of these symptoms.      02/27/2023    4:52 PM 12/06/2022    3:34 PM 04/05/2022    4:17 PM  Depression screen PHQ 2/9  Decreased Interest 0 0 0  Down, Depressed, Hopeless 0 0 2  PHQ - 2 Score 0 0 2  Altered sleeping   2  Tired, decreased energy   1  Change in appetite   2  Feeling bad or failure about yourself    1  Trouble concentrating   1  Moving slowly or fidgety/restless   0  Suicidal thoughts   0  PHQ-9 Score  9    Social History   Tobacco Use   Smoking status: Never   Smokeless tobacco: Never  Vaping Use   Vaping status: Never Used  Substance Use Topics   Alcohol use: No   Drug use: No    Review of Systems Per HPI unless specifically indicated above     Objective:    BP (!) 144/80   Ht 5\' 2"  (1.575 m)   Wt 204 lb (92.5 kg)   LMP 08/20/2014 Comment: missed cup when trying to obtain urine spec  BMI 37.31 kg/m   Wt Readings from Last 3 Encounters:  02/27/23 204 lb (92.5 kg)  02/06/23 200 lb (90.7 kg)   01/17/23 200 lb 9.9 oz (91 kg)    Physical Exam Vitals and nursing note reviewed.  Constitutional:      General: She is not in acute distress.    Appearance: Normal appearance. She is well-developed. She is obese. She is not diaphoretic.     Comments: Well-appearing, comfortable, cooperative  HENT:     Head: Normocephalic and atraumatic.  Eyes:     General:        Right eye: No discharge.        Left eye: No discharge.     Conjunctiva/sclera: Conjunctivae normal.  Cardiovascular:     Rate and Rhythm: Normal rate.  Pulmonary:     Effort: Pulmonary effort is normal.  Musculoskeletal:     Comments: Right hand wrapped in soft cast/wrap.   Rolling walker  Leg brace on Left lower extremity  Skin:    General: Skin is warm and dry.     Findings: No erythema or rash.  Neurological:     Mental Status: She is alert and oriented to person, place, and time.  Psychiatric:        Mood and Affect: Mood normal.        Behavior: Behavior normal.        Thought Content: Thought content normal.     Comments: Well groomed, good eye contact, normal speech and thoughts    Results for orders placed or performed in visit on 02/06/23  Hemoglobin A1c  Result Value Ref Range   Hemoglobin A1C 8       Assessment & Plan:   Problem List Items Addressed This Visit     Asthma, persistent controlled - Primary   Type 2 diabetes mellitus with other specified complication (HCC)   Relevant Medications   JARDIANCE 10 MG TABS tablet   GVOKE HYPOPEN 2-PACK 1 MG/0.2ML SOAJ   Other Visit Diagnoses     Soreness of tongue       Relevant Medications   magic mouthwash w/lidocaine SOLN   Dry mouth       Relevant Medications   magic mouthwash w/lidocaine SOLN   Oral thrush       Relevant Medications   magic mouthwash w/lidocaine SOLN       Assessment and Plan    Oral discomfort Dry Mouth  Tongue irritation and dry mouth likely secondary to Symbicort use. No signs of thrush or bacterial  infection. -Continue rinsing mouth with water after Symbicort use. -Consider using Biotene or similar over-the-counter dry mouthwash after Symbicort use. - AVS with remedy for dry mouth -Prescribe printed rx "magic mouthwash" fax to [facility] for use as needed during significant flare of tongue or mouth soreness.  Asthma moderate Followed by Pulm Using Symbicort and Singulair. Symbicort use is as needed, approximately two puffs twice per  week. -Continue current regimen.  Diabetes Type 2 Complication with comorbid conditions including Hypoglycemia events now on SGLT2  -Reduce Lantus from 30 units to 10 units daily. -Continue Metformin and Jardiance. -Check blood sugar regularly and report any issues. -Prescribe GVOKE glucagon emergency pen for use in case of severe hypoglycemia. Goal to either come down on Metformin or off insulin. Can increase Jardiance in future.  Cerebral Palsy Followed by [facility] PM&R on med management  Gastroesophageal Reflux Disease Family history of acid reflux. -Continue Omeprazole. - GI meds are AS NEEDED Dicyclomine, Zofran ETC  Urinary issues Taking Myrbetriq for bladder issues. -Continue Myrbetriq. Awaiting UroGYN apt in [place]  She expressed desire to reduce overall polypharmacy. We attempted to make progress today. It seems most meds are for mental health or pain / CP, limited options for me to reduce. I am trying to reduce her diabetes medication regimen at this time.    Meds ordered this encounter  Medications   JARDIANCE 10 MG TABS tablet    Sig: Take 1 tablet (10 mg total) by mouth daily before breakfast.    Dispense:  90 tablet    Refill:  1   GVOKE HYPOPEN 2-PACK 1 MG/0.2ML SOAJ    Sig: Inject 1 mg into the skin as needed (hypoglycemia).    Dispense:  1 mL    Refill:  0   magic mouthwash w/lidocaine SOLN    Sig: Swish, gargle, and spit one to two teaspoonfuls every six hours as needed. Shake well before using.    Dispense:  120 mL     Refill:  0    1 Part viscous lidocaine 2%  1 Part Maalox  1 Part diphenhydramine 12.5 mg per 5 ml elixir 1 Part Nystatin      Follow up plan: Return if symptoms worsen or fail to improve, for already scheduled for January 2025.  Saralyn Pilar, DO [facility] 02/27/2023, 2:37 PM

## 2023-02-27 NOTE — Patient Instructions (Addendum)
Thank you for coming to the office today.  Likely the Symbicort is causing dry mouth. Which can irritate the tongue and cause those fissures or splits.  I don't see thrush or any yeast or bacterial infection today  You can pause the Oral Debridement or Mouth Sore wash for right now. Unless it reduces pain that is fine.  I do recommend the Biotene or OTC version of the Dry Mouthwash. This might help the most after symbicort.  But definitely rinse mouth with water after EVERY symbicort use and drink water after.  Dentist recommendation for dry mouth - Mixture of equal parts Children's Benadryl and Pepto Bismol (2.5 mL of each = 5mL total) Swish and spit and rinse. Do not swallow  Ordered Magic Mouthwash to [facility] for significant flare of tongue or mouth soreness, thrush. This has numbing medicine, do not swallow  --------------------------------------------------  Reduce Lantus insulin from 30 units down to 10 units  Keep on Jardiance 10mg , new order today for 90 day + 1 refill  Keep on Metformin  Next time, we can consider reduce Metformin or Lantus again.  ------------  Discontinue Dicyclomine for abdominal cramping since diet changes can help instead.  -------  For the GYN + Urology in [place], call to check status  [facility] [address] [place],  [place]  [address] Main: [phone]   Please schedule a Follow-up Appointment to: No follow-ups on file.  If you have any other questions or concerns, please feel free to call the office or send a message through MyChart. You may also schedule an earlier appointment if necessary.  Additionally, you may be receiving a survey about your experience at our office within a few days to 1 week by e-mail or mail. We value your feedback.  Saralyn Pilar, DO [facility], [place]

## 2023-03-11 ENCOUNTER — Ambulatory Visit: Payer: 59 | Attending: Physical Medicine and Rehabilitation

## 2023-03-11 DIAGNOSIS — R262 Difficulty in walking, not elsewhere classified: Secondary | ICD-10-CM | POA: Diagnosis present

## 2023-03-11 NOTE — Progress Notes (Deleted)
OUTPATIENT PHYSICAL THERAPY EVALUATION   Patient Name: Traci Davis MRN: [id number] DOB:1971/01/10, 52 y.o., female Today's Date: 03/11/2023   PCP: Saralyn Pilar, DO REFERRING PROVIDER: Windle Guard, DO   END OF SESSION:  PT End of Session - 03/11/23 1508     Visit Number 1    Number of Visits 12    Date for PT Re-Evaluation 04/22/23    Authorization Type UHC dual complete; [place] Medicaid    Authorization Time Period 03/11/23-04/22/23    Progress Note Due on Visit 10    PT Start Time 1515    PT Stop Time 1555    PT Time Calculation (min) 40 min    Activity Tolerance Patient tolerated treatment well;No increased pain    Behavior During Therapy [facility] for tasks assessed/performed              Past Medical History:  Diagnosis Date   Acute pancreatitis 09/04/2014   Anxiety    Arthritis    Asthma    Cerebral palsy (HCC)    Complication of anesthesia    panic attacks before surgery   COPD (chronic obstructive pulmonary disease) (HCC)    Depression    Diabetes mellitus without complication (HCC)    GERD (gastroesophageal reflux disease)    Hypertension    Noninfectious gastroenteritis and colitis 01/10/2013   Past Surgical History:  Procedure Laterality Date   CESAREAN SECTION  1995   CHOLECYSTECTOMY     DILATATION & CURETTAGE/HYSTEROSCOPY WITH MYOSURE N/A 02/20/2018   Procedure: DILATATION & CURETTAGE/HYSTEROSCOPY WITH MYOSURE ENDOMETRIAL POLYPECTOMY;  Surgeon: Conard Novak, MD;  Location: [facility] ORS;  Service: Gynecology;  Laterality: N/A;   ESOPHAGOGASTRODUODENOSCOPY (EGD) WITH PROPOFOL N/A 01/01/2019   Procedure: ESOPHAGOGASTRODUODENOSCOPY (EGD) WITH PROPOFOL;  Surgeon: Toney Reil, MD;  Location: [facility] ENDOSCOPY;  Service: Gastroenterology;  Laterality: N/A;   FOOT CAPSULE RELEASE W/ PERCUTANEOUS HEEL CORD LENGTHENING, TIBIAL TENDON TRANSFER     age 52 ,and 2010-both legs   JOINT REPLACEMENT Right    both knees partial replacements dates  unknown   knee replacment     x 5 per pt. last one- left knee 2014. has had 2 on R 3 on L   TYMPANOPLASTY WITH GRAFT Right 01/08/2018   Procedure: TYMPANOPLASTY WITH POSSIBLE OSSICULAR CHAIN RECONSTRUCTION;  Surgeon: Geanie Logan, MD;  Location: [facility] ORS;  Service: ENT;  Laterality: Right;   Patient Active Problem List   Diagnosis Date Noted   Asthma, persistent controlled 02/06/2023   Continuous leakage of urine 01/04/2023   De Quervain's tenosynovitis, right 10/08/2022   Hot flashes due to menopause 07/17/2022   Convulsions (HCC) 07/09/2022   Obesity due to excess calories 09/22/2020   Insomnia due to medical condition 05/11/2020   Hamstring tendonitis 01/29/2020   MDD (major depressive disorder), recurrent, in full remission (HCC) 08/12/2019   Seizure-like activity (HCC) 07/08/2019   Mild episode of recurrent major depressive disorder (HCC) 12/29/2018   Insomnia due to mental condition 12/29/2018   Disorder of vein 03/04/2018   Lumbar sprain 03/04/2018   Muscle weakness 03/04/2018   Neck sprain 03/04/2018   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   Postmenopausal bleeding 02/18/2018   Impingement syndrome of shoulder region 02/04/2018   Chest pain with moderate risk for cardiac etiology 05/19/2017   GAD (generalized anxiety disorder) 04/15/2017   Chronic daily headache 04/15/2017   Panic attack 04/15/2017   Nocturnal enuresis 04/15/2017   Mild cognitive impairment 04/15/2017   Loss of memory 01/14/2017   Pain medication  agreement signed 01/08/2017   Headache disorder 12/04/2016   Chronic, continuous use of opioids 07/01/2015   Vertigo 07/01/2015   Chronic midline low back pain without sciatica 03/21/2015   Type 2 diabetes mellitus with other specified complication (HCC) 09/05/2014   Cerebral palsy (HCC) 01/05/2014   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   Hip pain, chronic 04/28/2013   Essential hypertension 01/10/2013   Irritable colon 01/10/2013   Encounter for  long-term (current) use of other medications 12/04/2012   Chronic GERD 08/12/2012   Diarrhea 08/12/2012   Primary localized osteoarthrosis, lower leg 05/07/2012   Difficulty walking 02/06/2012    ONSET DATE: chronic, congential   REFERRING DIAG: spastic diplegic CP; Rt leg pain; Lt leg pain   THERAPY DIAG:  Difficulty in walking, not elsewhere classified  Rationale for Evaluation and Treatment: Rehabilitation   SUBJECTIVE:                                                                                                                                                                                             SUBJECTIVE STATEMENT: Pt referred for acute exacerbation of BLE weakness, difficulty with walking, transfers.    PERTINENT HISTORY:  Traci Davis is a 52yoF who is referred to this clinic after a period of immobility and subsequent weakness upon resumption. Pt had a Rt wrist surgery in September and did not feel safe AMB with wrist splint. Pt reports her legs are much weakness now and she is having trouble mobilizing at her baseline. Pt familiar to this clinic from prior episodes.   PAIN:  Are you having pain? Yes, bilat leg pain, 7/10 bilat anterior thighs progressive with walking, resolves with rest/sitting. Pt reports chronic left lower leg pain which has been responsive to gabapentin for several weeks now.     PRECAUTIONS: Wears Left KAFO, Rt knee OA brace; has been released from Rt wrist splint but wears it when mobilizing due to soreness and fear of hitting it on something.  WEIGHT BEARING RESTRICTIONS: WBAT RUE  FALLS: Has patient fallen in last 6 months? 3 falls in past 6 months    PLOF: mod I AMB at baseline; platform walking in household, sometimes short community distances; power scooter or manual WC for community distance AMB; pt drives  PATIENT GOALS: help return to her baseline in AMB overground   OBJECTIVE:  Note: Objective measures were completed at  Evaluation unless otherwise noted.  LOWER EXTREMITY ROM:   Defer to visit 2   P/ROM  Right Eval Left Eval  Hip flexion    Hip extension    Hip abduction  Hip adduction    Hip internal rotation    Hip external rotation    Knee flexion    Knee extension    Ankle dorsiflexion    Ankle plantarflexion    Ankle inversion    Ankle eversion      LOWER EXTREMITY STRENGTH:   Defer to visit 2   MMT Right Eval Left Eval  Hip flexion    Hip extension    Hip abduction    Hip adduction    Hip internal rotation    Hip external rotation    Knee flexion    Knee extension    Ankle dorsiflexion    Ankle plantarflexion    Ankle inversion    Ankle eversion    (Blank rows = not tested)  BED MOBILITY:  Sleeps in a regular bed, uses a trapeze for repositioning; no adjustment, no bed rails   TRANSFERS: Assistive device utilized: ModI STS form platform walking   RAMP:  Level of Assistance: can typically manage Davis with WC or with platform Davis   CURB:  Level of Assistance: can typically manage Davis with WC or with platform Davis   STAIRS: Level of Assistance: does not AMB stairs at baseline  GAIT: Gait pattern: double platform Davis with with Left KAFO, but will occasionally AMB the household without KAFO while in socks.  FUNCTIONAL TESTS:  -3xSTS from chair: hands ad lib, Davis ad lib, Left KAFO, Rt knee brace: 19.95sec -121ft AMB: Left AFO, double platform RW; 2 minutes, 5 seconds (0.43m/s)   TODAY'S TREATMENT:  -evaluation only     PATIENT EDUCATION: Education details: Patient  Person educated: plan for assessment this date and how to generate goals for improvement Education method: Discussion Education comprehension: good   HOME EXERCISE PROGRAM: Access Code: [email or web address] URL: [email or web address] Date: 03/11/2023 Prepared by: Alvera Novel  Exercises - Seated Hip Abduction with Resistance  - 1 x daily - 7 x weekly - 2 sets - 10 reps - Seated Hip  Adduction Isometrics with Ball  - 1 x daily - 7 x weekly - 3 sets - 10 reps - 5 seconds hold - Seated Long Arc Quad with Strap  - 2 x daily - 4 x weekly - 4 sets - 5 reps - 5 seconds hold - Sit to Stand with Counter Support  - 2 x daily - 7 x weekly - 1 sets - 5 reps  GOALS: Goals reviewed with patient? Yes  SHORT TERM GOALS: Target date: 03/31/23  Pt to perform 3xSTS in <16 sec  Baseline: eval: 19.95sec  Goal status: INITIAL  2.  Pt to report regular performance of starter HEP at home and readiness/confidence to progress to additional standing activity.  Baseline: issued at eval, not regularly performing prior.  Goal status: INITIAL  3.  Pt to demonstrate 126ft AMB in clinic in <1 minute 40 seconds.  Baseline: eval: 45m5s Goal status: INITIAL   LONG TERM GOALS: Target date: 04/22/23  Pt to perform 5xSTS in <19.95 sec  Baseline: eval: 3xSTS in 19.95sec Goal status: INITIAL  2.  Pt to demonstrate tolerance of 245ft AMB without rest break, average speed faster than 0.35m/s Baseline:  Goal status: INITIAL  3.  Pt to improve score on MobQoL report measure by >20% to indicate improved mobility-specific quality of life.  Baseline: 03/11/23: 0.54 Goal status: INITIAL  ASSESSMENT:  CLINICAL IMPRESSION: 51yoF with chronic spastic diplegia, present with acute on chronic weakness, difficulty walking, heightened falls anxiety following a period of decreased  mobility pertaining to medical difficulty. Examination today revealing of significantly decreased performance of serial transfers, AMB overground of household distances, and self report of QOL as it pertains to mobility difficulty. Physical therapy can address these deficits and impairments and restore to baseline in order to reduce falls risk, improved mobility ergonomics, and improve QOL.    OBJECTIVE IMPAIRMENTS: Abnormal gait, cardiopulmonary status limiting activity, decreased activity tolerance, decreased balance, decreased  coordination, decreased endurance, decreased mobility, difficulty walking, decreased ROM, and decreased strength.   ACTIVITY LIMITATIONS: standing, squatting, stairs, transfers, bed mobility, and bathing  PARTICIPATION LIMITATIONS: cleaning, medication management, interpersonal relationship, driving, shopping, and community activity  PERSONAL FACTORS: Age, Behavior pattern, Education, and Past/current experiences are also affecting patient's functional outcome.   REHAB POTENTIAL: Good  CLINICAL DECISION MAKING: Stable/uncomplicated  EVALUATION COMPLEXITY: Moderate  PLAN:  PT FREQUENCY: 1-2x/week  PT DURATION: 6 weeks  PLANNED INTERVENTIONS: 97110-Therapeutic exercises, 97530- Therapeutic activity, [id number]- Neuromuscular re-education, 97535- Self Care, 11914- Manual therapy, [id number]- Subsequent splinting/medication, 97014- Electrical stimulation (unattended), [id number]- Electrical stimulation (manual), 78295- Traction (mechanical), Patient/Family education, and Balance training  PLAN FOR NEXT SESSION: FU on resumption of HEP; any strength testing or joint ROM seen as relevant    Traci Davis, PT 03/11/2023, 3:10 PM  12:12 PM, 03/12/23 Traci Davis, PT, DPT Physical Therapist - [place] [phone] (Office)

## 2023-03-20 ENCOUNTER — Ambulatory Visit: Payer: 59

## 2023-03-20 DIAGNOSIS — R262 Difficulty in walking, not elsewhere classified: Secondary | ICD-10-CM | POA: Diagnosis not present

## 2023-03-20 NOTE — Therapy (Signed)
OUTPATIENT PHYSICAL THERAPY EVALUATION   Patient Name: Traci Davis MRN: [id number] DOB:1971/01/10, 52 y.o., female Today's Date: 03/11/2023   PCP: Saralyn Pilar, DO REFERRING PROVIDER: Windle Guard, DO   END OF SESSION:  PT End of Session - 03/11/23 1508     Visit Number 1    Number of Visits 12    Date for PT Re-Evaluation 04/22/23    Authorization Type UHC dual complete; [place] Medicaid    Authorization Time Period 03/11/23-04/22/23    Progress Note Due on Visit 10    PT Start Time 1515    PT Stop Time 1555    PT Time Calculation (min) 40 min    Activity Tolerance Patient tolerated treatment well;No increased pain    Behavior During Therapy [facility] for tasks assessed/performed              Past Medical History:  Diagnosis Date   Acute pancreatitis 09/04/2014   Anxiety    Arthritis    Asthma    Cerebral palsy (HCC)    Complication of anesthesia    panic attacks before surgery   COPD (chronic obstructive pulmonary disease) (HCC)    Depression    Diabetes mellitus without complication (HCC)    GERD (gastroesophageal reflux disease)    Hypertension    Noninfectious gastroenteritis and colitis 01/10/2013   Past Surgical History:  Procedure Laterality Date   CESAREAN SECTION  1995   CHOLECYSTECTOMY     DILATATION & CURETTAGE/HYSTEROSCOPY WITH MYOSURE N/A 02/20/2018   Procedure: DILATATION & CURETTAGE/HYSTEROSCOPY WITH MYOSURE ENDOMETRIAL POLYPECTOMY;  Surgeon: Conard Novak, MD;  Location: [facility] ORS;  Service: Gynecology;  Laterality: N/A;   ESOPHAGOGASTRODUODENOSCOPY (EGD) WITH PROPOFOL N/A 01/01/2019   Procedure: ESOPHAGOGASTRODUODENOSCOPY (EGD) WITH PROPOFOL;  Surgeon: Toney Reil, MD;  Location: [facility] ENDOSCOPY;  Service: Gastroenterology;  Laterality: N/A;   FOOT CAPSULE RELEASE W/ PERCUTANEOUS HEEL CORD LENGTHENING, TIBIAL TENDON TRANSFER     age 64 ,and 2010-both legs   JOINT REPLACEMENT Right    both knees partial replacements dates  unknown   knee replacment     x 5 per pt. last one- left knee 2014. has had 2 on R 3 on L   TYMPANOPLASTY WITH GRAFT Right 01/08/2018   Procedure: TYMPANOPLASTY WITH POSSIBLE OSSICULAR CHAIN RECONSTRUCTION;  Surgeon: Geanie Logan, MD;  Location: [facility] ORS;  Service: ENT;  Laterality: Right;   Patient Active Problem List   Diagnosis Date Noted   Asthma, persistent controlled 02/06/2023   Continuous leakage of urine 01/04/2023   De Quervain's tenosynovitis, right 10/08/2022   Hot flashes due to menopause 07/17/2022   Convulsions (HCC) 07/09/2022   Obesity due to excess calories 09/22/2020   Insomnia due to medical condition 05/11/2020   Hamstring tendonitis 01/29/2020   MDD (major depressive disorder), recurrent, in full remission (HCC) 08/12/2019   Seizure-like activity (HCC) 07/08/2019   Mild episode of recurrent major depressive disorder (HCC) 12/29/2018   Insomnia due to mental condition 12/29/2018   Disorder of vein 03/04/2018   Lumbar sprain 03/04/2018   Muscle weakness 03/04/2018   Neck sprain 03/04/2018   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   Postmenopausal bleeding 02/18/2018   Impingement syndrome of shoulder region 02/04/2018   Chest pain with moderate risk for cardiac etiology 05/19/2017   GAD (generalized anxiety disorder) 04/15/2017   Chronic daily headache 04/15/2017   Panic attack 04/15/2017   Nocturnal enuresis 04/15/2017   Mild cognitive impairment 04/15/2017   Loss of memory 01/14/2017   Pain medication  agreement signed 01/08/2017   Headache disorder 12/04/2016   Chronic, continuous use of opioids 07/01/2015   Vertigo 07/01/2015   Chronic midline low back pain without sciatica 03/21/2015   Type 2 diabetes mellitus with other specified complication (HCC) 09/05/2014   Cerebral palsy (HCC) 01/05/2014   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   Hip pain, chronic 04/28/2013   Essential hypertension 01/10/2013   Irritable colon 01/10/2013   Encounter for  long-term (current) use of other medications 12/04/2012   Chronic GERD 08/12/2012   Diarrhea 08/12/2012   Primary localized osteoarthrosis, lower leg 05/07/2012   Difficulty walking 02/06/2012    ONSET DATE: chronic, congential   REFERRING DIAG: spastic diplegic CP; Rt leg pain; Lt leg pain   THERAPY DIAG:  Difficulty in walking, not elsewhere classified  Rationale for Evaluation and Treatment: Rehabilitation   SUBJECTIVE:                                                                                                                                                                                             SUBJECTIVE STATEMENT: Pt referred for acute exacerbation of BLE weakness, difficulty with walking, transfers.    PERTINENT HISTORY:  [name] is a 51yoF who is referred to this clinic after a period of immobility and subsequent weakness upon resumption. Pt had a Rt wrist surgery in September and did not feel safe AMB with wrist splint. Pt reports her legs are much weakness now and she is having trouble mobilizing at her baseline. Pt familiar to this clinic from prior episodes.   PAIN:  Are you having pain? Yes, bilat leg pain, 7/10 bilat anterior thighs progressive with walking, resolves with rest/sitting. Pt reports chronic left lower leg pain which has been responsive to gabapentin for several weeks now.     PRECAUTIONS: Wears Left KAFO, Rt knee OA brace; has been released from Rt wrist splint but wears it when mobilizing due to soreness and fear of hitting it on something.  WEIGHT BEARING RESTRICTIONS: WBAT RUE  FALLS: Has patient fallen in last 6 months? 3 falls in past 6 months    PLOF: mod I AMB at baseline; platform walking in household, sometimes short community distances; power scooter or manual WC for community distance AMB; pt drives  PATIENT GOALS: help return to her baseline in AMB overground   OBJECTIVE:  Note: Objective measures were completed at  Evaluation unless otherwise noted.  LOWER EXTREMITY ROM:   Defer to visit 2   P/ROM  Right Eval Left Eval  Hip flexion    Hip extension    Hip abduction  Hip adduction    Hip internal rotation    Hip external rotation    Knee flexion    Knee extension    Ankle dorsiflexion    Ankle plantarflexion    Ankle inversion    Ankle eversion      LOWER EXTREMITY STRENGTH:   Defer to visit 2   MMT Right Eval Left Eval  Hip flexion    Hip extension    Hip abduction    Hip adduction    Hip internal rotation    Hip external rotation    Knee flexion    Knee extension    Ankle dorsiflexion    Ankle plantarflexion    Ankle inversion    Ankle eversion    (Blank rows = not tested)  BED MOBILITY:  Sleeps in a regular bed, uses a trapeze for repositioning; no adjustment, no bed rails   TRANSFERS: Assistive device utilized: ModI STS form platform walking   RAMP:  Level of Assistance: can typically manage c with WC or with platform Davis   CURB:  Level of Assistance: can typically manage c with WC or with platform Davis   STAIRS: Level of Assistance: does not AMB stairs at baseline  GAIT: Gait pattern: double platform Davis with with Left KAFO, but will occasionally AMB the household without KAFO while in socks.  FUNCTIONAL TESTS:  -3xSTS from chair: hands ad lib, Davis ad lib, Left KAFO, Rt knee brace: 19.95sec -121ft AMB: Left AFO, double platform RW; 2 minutes, 5 seconds (0.43m/s)   TODAY'S TREATMENT:  -evaluation only     PATIENT EDUCATION: Education details: Patient  Person educated: plan for assessment this date and how to generate goals for improvement Education method: Discussion Education comprehension: good   HOME EXERCISE PROGRAM: Access Code: [email or web address] URL: [email or web address] Date: 03/11/2023 Prepared by: Alvera Novel  Exercises - Seated Hip Abduction with Resistance  - 1 x daily - 7 x weekly - 2 sets - 10 reps - Seated Hip  Adduction Isometrics with Ball  - 1 x daily - 7 x weekly - 3 sets - 10 reps - 5 seconds hold - Seated Long Arc Quad with Strap  - 2 x daily - 4 x weekly - 4 sets - 5 reps - 5 seconds hold - Sit to Stand with Counter Support  - 2 x daily - 7 x weekly - 1 sets - 5 reps  GOALS: Goals reviewed with patient? Yes  SHORT TERM GOALS: Target date: 03/31/23  Pt to perform 3xSTS in <16 sec  Baseline: eval: 19.95sec  Goal status: INITIAL  2.  Pt to report regular performance of starter HEP at home and readiness/confidence to progress to additional standing activity.  Baseline: issued at eval, not regularly performing prior.  Goal status: INITIAL  3.  Pt to demonstrate 126ft AMB in clinic in <1 minute 40 seconds.  Baseline: eval: 45m5s Goal status: INITIAL   LONG TERM GOALS: Target date: 04/22/23  Pt to perform 5xSTS in <19.95 sec  Baseline: eval: 3xSTS in 19.95sec Goal status: INITIAL  2.  Pt to demonstrate tolerance of 245ft AMB without rest break, average speed faster than 0.35m/s Baseline:  Goal status: INITIAL  3.  Pt to improve score on MobQoL report measure by >20% to indicate improved mobility-specific quality of life.  Baseline: 03/11/23: 0.54 Goal status: INITIAL  ASSESSMENT:  CLINICAL IMPRESSION: 51yoF with chronic spastic diplegia, present with acute on chronic weakness, difficulty walking, heightened falls anxiety following a period of decreased  mobility pertaining to medical difficulty. Examination today revealing of significantly decreased performance of serial transfers, AMB overground of household distances, and self report of QOL as it pertains to mobility difficulty. Physical therapy can address these deficits and impairments and restore to baseline in order to reduce falls risk, improved mobility ergonomics, and improve QOL.    OBJECTIVE IMPAIRMENTS: Abnormal gait, cardiopulmonary status limiting activity, decreased activity tolerance, decreased balance, decreased  coordination, decreased endurance, decreased mobility, difficulty walking, decreased ROM, and decreased strength.   ACTIVITY LIMITATIONS: standing, squatting, stairs, transfers, bed mobility, and bathing  PARTICIPATION LIMITATIONS: cleaning, medication management, interpersonal relationship, driving, shopping, and community activity  PERSONAL FACTORS: Age, Behavior pattern, Education, and Past/current experiences are also affecting patient's functional outcome.   REHAB POTENTIAL: Good  CLINICAL DECISION MAKING: Stable/uncomplicated  EVALUATION COMPLEXITY: Moderate  PLAN:  PT FREQUENCY: 1-2x/week  PT DURATION: 6 weeks  PLANNED INTERVENTIONS: 97110-Therapeutic exercises, 97530- Therapeutic activity, [id number]- Neuromuscular re-education, 97535- Self Care, 11914- Manual therapy, [id number]- Subsequent splinting/medication, 97014- Electrical stimulation (unattended), [id number]- Electrical stimulation (manual), 78295- Traction (mechanical), Patient/Family education, and Balance training  PLAN FOR NEXT SESSION: FU on resumption of HEP; any strength testing or joint ROM seen as relevant    Palestine Mosco C, PT 03/11/2023, 3:10 PM  12:12 PM, 03/12/23 Rosamaria Lints, PT, DPT Physical Therapist - [place] [phone] (Office)

## 2023-03-20 NOTE — Progress Notes (Signed)
OUTPATIENT PHYSICAL THERAPY EVALUATION   Patient Name: Traci Davis MRN: [id number] DOB:15-Feb-1971, 52 y.o., female Today's Date: 03/20/2023   PCP: Saralyn Pilar, DO REFERRING PROVIDER: Windle Guard, DO   END OF SESSION:     Past Medical History:  Diagnosis Date   Acute pancreatitis 09/04/2014   Anxiety    Arthritis    Asthma    Cerebral palsy (HCC)    Complication of anesthesia    panic attacks before surgery   COPD (chronic obstructive pulmonary disease) (HCC)    Depression    Diabetes mellitus without complication (HCC)    GERD (gastroesophageal reflux disease)    Hypertension    Noninfectious gastroenteritis and colitis 01/10/2013   Past Surgical History:  Procedure Laterality Date   CESAREAN SECTION  1995   CHOLECYSTECTOMY     DILATATION & CURETTAGE/HYSTEROSCOPY WITH MYOSURE N/A 02/20/2018   Procedure: DILATATION & CURETTAGE/HYSTEROSCOPY WITH MYOSURE ENDOMETRIAL POLYPECTOMY;  Surgeon: Conard Novak, MD;  Location: [facility] ORS;  Service: Gynecology;  Laterality: N/A;   ESOPHAGOGASTRODUODENOSCOPY (EGD) WITH PROPOFOL N/A 01/01/2019   Procedure: ESOPHAGOGASTRODUODENOSCOPY (EGD) WITH PROPOFOL;  Surgeon: Toney Reil, MD;  Location: [facility] ENDOSCOPY;  Service: Gastroenterology;  Laterality: N/A;   FOOT CAPSULE RELEASE W/ PERCUTANEOUS HEEL CORD LENGTHENING, TIBIAL TENDON TRANSFER     age 82 ,and 2010-both legs   JOINT REPLACEMENT Right    both knees partial replacements dates unknown   knee replacment     x 5 per pt. last one- left knee 2014. has had 2 on R 3 on L   TYMPANOPLASTY WITH GRAFT Right 01/08/2018   Procedure: TYMPANOPLASTY WITH POSSIBLE OSSICULAR CHAIN RECONSTRUCTION;  Surgeon: Geanie Logan, MD;  Location: [facility] ORS;  Service: ENT;  Laterality: Right;   Patient Active Problem List   Diagnosis Date Noted   Asthma, persistent controlled 02/06/2023   Continuous leakage of urine 01/04/2023   De Quervain's tenosynovitis, right 10/08/2022    Hot flashes due to menopause 07/17/2022   Convulsions (HCC) 07/09/2022   Obesity due to excess calories 09/22/2020   Insomnia due to medical condition 05/11/2020   Hamstring tendonitis 01/29/2020   MDD (major depressive disorder), recurrent, in full remission (HCC) 08/12/2019   Seizure-like activity (HCC) 07/08/2019   Mild episode of recurrent major depressive disorder (HCC) 12/29/2018   Insomnia due to mental condition 12/29/2018   Disorder of vein 03/04/2018   Lumbar sprain 03/04/2018   Muscle weakness 03/04/2018   Neck sprain 03/04/2018   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   Postmenopausal bleeding 02/18/2018   Impingement syndrome of shoulder region 02/04/2018   Chest pain with moderate risk for cardiac etiology 05/19/2017   GAD (generalized anxiety disorder) 04/15/2017   Chronic daily headache 04/15/2017   Panic attack 04/15/2017   Nocturnal enuresis 04/15/2017   Mild cognitive impairment 04/15/2017   Loss of memory 01/14/2017   Pain medication agreement signed 01/08/2017   Headache disorder 12/04/2016   Chronic, continuous use of opioids 07/01/2015   Vertigo 07/01/2015   Chronic midline low back pain without sciatica 03/21/2015   Type 2 diabetes mellitus with other specified complication (HCC) 09/05/2014   Cerebral palsy (HCC) 01/05/2014   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   Hip pain, chronic 04/28/2013   Essential hypertension 01/10/2013   Irritable colon 01/10/2013   Encounter for long-term (current) use of other medications 12/04/2012   Chronic GERD 08/12/2012   Diarrhea 08/12/2012   Primary localized osteoarthrosis, lower leg 05/07/2012   Difficulty walking 02/06/2012    ONSET  DATE: chronic, congential   REFERRING DIAG: spastic diplegic CP; Rt leg pain; Lt leg pain   THERAPY DIAG:  Difficulty in walking, not elsewhere classified - Plan: PT plan of care cert/re-cert  Rationale for Evaluation and Treatment: Rehabilitation   SUBJECTIVE:                                                                                                                                                                                              SUBJECTIVE STATEMENT: Pt referred for acute exacerbation of BLE weakness, difficulty with walking, transfers.    PERTINENT HISTORY:  Traci Davis is a 51yoF who is referred to this clinic after a period of immobility and subsequent weakness upon resumption. Pt had a Rt wrist surgery in September and did not feel safe AMB with wrist splint. Pt reports her legs are much weakness now and she is having trouble mobilizing at her baseline. Pt familiar to this clinic from prior episodes.   PAIN:  Are you having pain? Yes, bilat leg pain, 7/10 bilat anterior thighs progressive with walking, resolves with rest/sitting. Pt reports chronic left lower leg pain which has been responsive to gabapentin for several weeks now.     PRECAUTIONS: Wears Left KAFO, Rt knee OA brace; has been released from Rt wrist splint but wears it when mobilizing due to soreness and fear of hitting it on something.  WEIGHT BEARING RESTRICTIONS: WBAT RUE  FALLS: Has patient fallen in last 6 months? 3 falls in past 6 months    PLOF: mod I AMB at baseline; platform walking in household, sometimes short community distances; power scooter or manual WC for community distance AMB; pt drives  PATIENT GOALS: help return to her baseline in AMB overground   OBJECTIVE:  Note: Objective measures were completed at Evaluation unless otherwise noted.  LOWER EXTREMITY ROM:   Defer to visit 2   P/ROM  Right Eval Left Eval  Hip flexion    Hip extension    Hip abduction    Hip adduction    Hip internal rotation    Hip external rotation    Knee flexion    Knee extension    Ankle dorsiflexion    Ankle plantarflexion    Ankle inversion    Ankle eversion      LOWER EXTREMITY STRENGTH:   Defer to visit 2   MMT Right Eval Left Eval  Hip flexion    Hip  extension    Hip abduction    Hip adduction    Hip internal rotation    Hip external rotation    Knee flexion  Knee extension    Ankle dorsiflexion    Ankle plantarflexion    Ankle inversion    Ankle eversion    (Blank rows = not tested)  BED MOBILITY:  Sleeps in a regular bed, uses a trapeze for repositioning; no adjustment, no bed rails   TRANSFERS: Assistive device utilized: ModI STS form platform walking   RAMP:  Level of Assistance: can typically manage c with WC or with platform walker   CURB:  Level of Assistance: can typically manage c with WC or with platform walker   STAIRS: Level of Assistance: does not AMB stairs at baseline  GAIT: Gait pattern: double platform walker with with Left KAFO, but will occasionally AMB the household without KAFO while in socks.  FUNCTIONAL TESTS:  -3xSTS from chair: hands ad lib, walker ad lib, Left KAFO, Rt knee brace: 19.95sec -128ft AMB: Left AFO, double platform RW; 2 minutes, 5 seconds (0.26m/s)   TODAY'S TREATMENT:  -evaluation only     PATIENT EDUCATION: Education details: Patient  Person educated: plan for assessment this date and how to generate goals for improvement Education method: Discussion Education comprehension: good   HOME EXERCISE PROGRAM: Access Code: [email or web address] URL: [email or web address] Date: 03/11/2023 Prepared by: Alvera Novel  Exercises - Seated Hip Abduction with Resistance  - 1 x daily - 7 x weekly - 2 sets - 10 reps - Seated Hip Adduction Isometrics with Ball  - 1 x daily - 7 x weekly - 3 sets - 10 reps - 5 seconds hold - Seated Long Arc Quad with Strap  - 2 x daily - 4 x weekly - 4 sets - 5 reps - 5 seconds hold - Sit to Stand with Counter Support  - 2 x daily - 7 x weekly - 1 sets - 5 reps  GOALS: Goals reviewed with patient? Yes  SHORT TERM GOALS: Target date: 03/31/23  Pt to perform 3xSTS in <16 sec  Baseline: eval: 19.95sec  Goal status: INITIAL  2.  Pt to report  regular performance of starter HEP at home and readiness/confidence to progress to additional standing activity.  Baseline: issued at eval, not regularly performing prior.  Goal status: INITIAL  3.  Pt to demonstrate 126ft AMB in clinic in <1 minute 40 seconds.  Baseline: eval: 75m5s Goal status: INITIAL   LONG TERM GOALS: Target date: 04/22/23  Pt to perform 5xSTS in <19.95 sec  Baseline: eval: 3xSTS in 19.95sec Goal status: INITIAL  2.  Pt to demonstrate tolerance of 226ft AMB without rest break, average speed faster than 0.46m/s Baseline:  Goal status: INITIAL  3.  Pt to improve score on MobQoL report measure by >20% to indicate improved mobility-specific quality of life.  Baseline: 03/11/23: 0.54 Goal status: INITIAL  ASSESSMENT:  CLINICAL IMPRESSION: 51yoF with chronic spastic diplegia, present with acute on chronic weakness, difficulty walking, heightened falls anxiety following a period of decreased mobility pertaining to medical difficulty. Examination today revealing of significantly decreased performance of serial transfers, AMB overground of household distances, and self report of QOL as it pertains to mobility difficulty. Physical therapy can address these deficits and impairments and restore to baseline in order to reduce falls risk, improved mobility ergonomics, and improve QOL.    OBJECTIVE IMPAIRMENTS: Abnormal gait, cardiopulmonary status limiting activity, decreased activity tolerance, decreased balance, decreased coordination, decreased endurance, decreased mobility, difficulty walking, decreased ROM, and decreased strength.   ACTIVITY LIMITATIONS: standing, squatting, stairs, transfers, bed mobility, and bathing  PARTICIPATION LIMITATIONS: cleaning, medication management, interpersonal  relationship, driving, shopping, and community activity  PERSONAL FACTORS: Age, Behavior pattern, Education, and Past/current experiences are also affecting patient's functional  outcome.   REHAB POTENTIAL: Good  CLINICAL DECISION MAKING: Stable/uncomplicated  EVALUATION COMPLEXITY: Moderate  PLAN:  PT FREQUENCY: 1-2x/week  PT DURATION: 6 weeks  PLANNED INTERVENTIONS: 97110-Therapeutic exercises, 97530- Therapeutic activity, [id number]- Neuromuscular re-education, 97535- Self Care, 16109- Manual therapy, [phone]- Subsequent splinting/medication, 97014- Electrical stimulation (unattended), [phone]- Electrical stimulation (manual), 91478- Traction (mechanical), Patient/Family education, and Balance training  PLAN FOR NEXT SESSION: FU on resumption of HEP; any strength testing or joint ROM seen as relevant    Phineas Real, PT 03/20/2023, 2:31 PM  2:31 PM, 03/20/23 Rosamaria Lints, PT, DPT Physical Therapist - [place] [phone] (Office)

## 2023-03-20 NOTE — Therapy (Signed)
OUTPATIENT PHYSICAL THERAPY TREATMENT   Patient Name: Traci Davis MRN: [id number] DOB:Mar 01, 1971, 52 y.o., female Today's Date: 03/11/2023   PCP: Saralyn Pilar, DO REFERRING PROVIDER: Windle Guard, DO   END OF SESSION:  PT End of Session - 03/20/23 1438     Visit Number 2    Number of Visits 12    Date for PT Re-Evaluation 04/22/23    Authorization Type UHC dual complete;  Medicaid    Authorization Time Period 03/11/23-04/22/23    Progress Note Due on Visit 10    PT Start Time 1438    PT Stop Time 1515    PT Time Calculation (min) 37 min    Activity Tolerance Patient tolerated treatment well;No increased pain    Behavior During Therapy [facility] for tasks assessed/performed               Past Medical History:  Diagnosis Date   Acute pancreatitis 09/04/2014   Anxiety    Arthritis    Asthma    Cerebral palsy (HCC)    Complication of anesthesia    panic attacks before surgery   COPD (chronic obstructive pulmonary disease) (HCC)    Depression    Diabetes mellitus without complication (HCC)    GERD (gastroesophageal reflux disease)    Hypertension    Noninfectious gastroenteritis and colitis 01/10/2013    Past Surgical History:  Procedure Laterality Date   CESAREAN SECTION  1995   CHOLECYSTECTOMY     DILATATION & CURETTAGE/HYSTEROSCOPY WITH MYOSURE N/A 02/20/2018   Procedure: DILATATION & CURETTAGE/HYSTEROSCOPY WITH MYOSURE ENDOMETRIAL POLYPECTOMY;  Surgeon: Conard Novak, MD;  Location: [facility] ORS;  Service: Gynecology;  Laterality: N/A;   ESOPHAGOGASTRODUODENOSCOPY (EGD) WITH PROPOFOL N/A 01/01/2019   Procedure: ESOPHAGOGASTRODUODENOSCOPY (EGD) WITH PROPOFOL;  Surgeon: Toney Reil, MD;  Location: [facility] ENDOSCOPY;  Service: Gastroenterology;  Laterality: N/A;   FOOT CAPSULE RELEASE W/ PERCUTANEOUS HEEL CORD LENGTHENING, TIBIAL TENDON TRANSFER     age 43 ,and 2010-both legs   JOINT REPLACEMENT Right    both knees partial replacements dates  unknown   knee replacment     x 5 per pt. last one- left knee 2014. has had 2 on R 3 on L   TYMPANOPLASTY WITH GRAFT Right 01/08/2018   Procedure: TYMPANOPLASTY WITH POSSIBLE OSSICULAR CHAIN RECONSTRUCTION;  Surgeon: Geanie Logan, MD;  Location: [facility] ORS;  Service: ENT;  Laterality: Right;    Patient Active Problem List   Diagnosis Date Noted   Asthma, persistent controlled 02/06/2023   Continuous leakage of urine 01/04/2023   De Quervain's tenosynovitis, right 10/08/2022   Hot flashes due to menopause 07/17/2022   Convulsions (HCC) 07/09/2022   Obesity due to excess calories 09/22/2020   Insomnia due to medical condition 05/11/2020   Hamstring tendonitis 01/29/2020   MDD (major depressive disorder), recurrent, in full remission (HCC) 08/12/2019   Seizure-like activity (HCC) 07/08/2019   Mild episode of recurrent major depressive disorder (HCC) 12/29/2018   Insomnia due to mental condition 12/29/2018   Disorder of vein 03/04/2018   Lumbar sprain 03/04/2018   Muscle weakness 03/04/2018   Neck sprain 03/04/2018   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   Postmenopausal bleeding 02/18/2018   Impingement syndrome of shoulder region 02/04/2018   Chest pain with moderate risk for cardiac etiology 05/19/2017   GAD (generalized anxiety disorder) 04/15/2017   Chronic daily headache 04/15/2017   Panic attack 04/15/2017   Nocturnal enuresis 04/15/2017   Mild cognitive impairment 04/15/2017   Loss of memory 01/14/2017  Pain medication agreement signed 01/08/2017   Headache disorder 12/04/2016   Chronic, continuous use of opioids 07/01/2015   Vertigo 07/01/2015   Chronic midline low back pain without sciatica 03/21/2015   Type 2 diabetes mellitus with other specified complication (HCC) 09/05/2014   Cerebral palsy (HCC) 01/05/2014   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   Hip pain, chronic 04/28/2013   Essential hypertension 01/10/2013   Irritable colon 01/10/2013   Encounter for  long-term (current) use of other medications 12/04/2012   Chronic GERD 08/12/2012   Diarrhea 08/12/2012   Primary localized osteoarthrosis, lower leg 05/07/2012   Difficulty walking 02/06/2012     ONSET DATE: chronic, congential   REFERRING DIAG: spastic diplegic CP; Rt leg pain; Lt leg pain   THERAPY DIAG:  Difficulty in walking, not elsewhere classified  Rationale for Evaluation and Treatment: Rehabilitation    SUBJECTIVE:                                                                                                                                                                                             SUBJECTIVE STATEMENT:  Pt reports to be fatigued just from walking into the clinic.  Pt notes she is trying to get used to platform walker, which she has not been using it.   PERTINENT HISTORY:  Traci Davis is a 51yoF who is referred to this clinic after a period of immobility and subsequent weakness upon resumption. Pt had a Rt wrist surgery in September and did not feel safe AMB with wrist splint. Pt reports her legs are much weakness now and she is having trouble mobilizing at her baseline. Pt familiar to this clinic from prior episodes.   PAIN:  Are you having pain? Yes, bilat leg pain, 7/10 bilat anterior thighs progressive with walking, resolves with rest/sitting. Pt reports chronic left lower leg pain which has been responsive to gabapentin for several weeks now.     PRECAUTIONS: Wears Left KAFO, Rt knee OA brace; has been released from Rt wrist splint but wears it when mobilizing due to soreness and fear of hitting it on something.  WEIGHT BEARING RESTRICTIONS: WBAT RUE  FALLS: Has patient fallen in last 6 months? 3 falls in past 6 months    PLOF: mod I AMB at baseline; platform walking in household, sometimes short community distances; power scooter or manual WC for community distance AMB; pt drives  PATIENT GOALS: help return to her baseline in AMB  overground   OBJECTIVE:  Note: Objective measures were completed at Evaluation unless otherwise noted.  LOWER EXTREMITY ROM:   Defer to visit 2  P/ROM  Right Eval Left Eval  Hip flexion    Hip extension    Hip abduction    Hip adduction    Hip internal rotation    Hip external rotation    Knee flexion    Knee extension    Ankle dorsiflexion    Ankle plantarflexion    Ankle inversion    Ankle eversion      LOWER EXTREMITY STRENGTH:   Defer to visit 2   MMT Right Eval Left Eval  Hip flexion    Hip extension    Hip abduction    Hip adduction    Hip internal rotation    Hip external rotation    Knee flexion    Knee extension    Ankle dorsiflexion    Ankle plantarflexion    Ankle inversion    Ankle eversion    (Blank rows = not tested)  BED MOBILITY:  Sleeps in a regular bed, uses a trapeze for repositioning; no adjustment, no bed rails   TRANSFERS: Assistive device utilized: ModI STS form platform walking   RAMP:  Level of Assistance: can typically manage c with WC or with platform walker   CURB:  Level of Assistance: can typically manage c with WC or with platform walker   STAIRS: Level of Assistance: does not AMB stairs at baseline  GAIT: Gait pattern: double platform walker with with Left KAFO, but will occasionally AMB the household without KAFO while in socks.  FUNCTIONAL TESTS:  -3xSTS from chair: hands ad lib, walker ad lib, Left KAFO, Rt knee brace: 19.95sec -13ft AMB: Left AFO, double platform RW; 2 minutes, 5 seconds (0.38m/s)   TODAY'S TREATMENT:   TherAct:  Education and visual cuing for proper form of utilizing the double platform walker Ambulation around the gym with proper form staying within the walker, 1 total lap   TherEx:  Seated LAQ, 2x10 each LE Seated hamstring curls with YTB, 2x10 Seated hip adduction into green physioball, 3 sec holds, 2x10    PATIENT EDUCATION: Education details: Patient  Person educated: plan  for assessment this date and how to generate goals for improvement Education method: Discussion Education comprehension: good   HOME EXERCISE PROGRAM: Access Code: [email or web address] URL: [email or web address] Date: 03/11/2023 Prepared by: Alvera Novel  Exercises - Seated Hip Abduction with Resistance  - 1 x daily - 7 x weekly - 2 sets - 10 reps - Seated Hip Adduction Isometrics with Ball  - 1 x daily - 7 x weekly - 3 sets - 10 reps - 5 seconds hold - Seated Long Arc Quad with Strap  - 2 x daily - 4 x weekly - 4 sets - 5 reps - 5 seconds hold - Sit to Stand with Counter Support  - 2 x daily - 7 x weekly - 1 sets - 5 reps  GOALS: Goals reviewed with patient? Yes  SHORT TERM GOALS: Target date: 03/31/23  Pt to perform 3xSTS in <16 sec  Baseline: eval: 19.95sec  Goal status: INITIAL  2.  Pt to report regular performance of starter HEP at home and readiness/confidence to progress to additional standing activity.  Baseline: issued at eval, not regularly performing prior.  Goal status: INITIAL  3.  Pt to demonstrate 170ft AMB in clinic in <1 minute 40 seconds.  Baseline: eval: 34m5s Goal status: INITIAL   LONG TERM GOALS: Target date: 04/22/23  Pt to perform 5xSTS in <19.95 sec  Baseline: eval: 3xSTS in 19.95sec Goal status: INITIAL  2.  Pt to demonstrate tolerance of 260ft AMB without rest break, average speed faster than 0.42m/s Baseline:  Goal status: INITIAL  3.  Pt to improve score on MobQoL report measure by >20% to indicate improved mobility-specific quality of life.  Baseline: 03/11/23: 0.54 Goal status: INITIAL  ASSESSMENT:  CLINICAL IMPRESSION:  Pt responded well to the exercises and activities in order to ease the mobility aspect of her life.  Pt given verbal and visual instruction on how to perform ambulation with the double platform walker,, and pt has good carryover at the end of the session when ambulating out at conclusion of the session.  Pt  instructed in strengthening exercises in order to improve functional mobility as well.  Pt would best be served by performing strengthening exercises that target hip flexors moving forward.   Pt will continue to benefit from skilled therapy to address remaining deficits in order to improve overall QoL and return to PLOF.     OBJECTIVE IMPAIRMENTS: Abnormal gait, cardiopulmonary status limiting activity, decreased activity tolerance, decreased balance, decreased coordination, decreased endurance, decreased mobility, difficulty walking, decreased ROM, and decreased strength.   ACTIVITY LIMITATIONS: standing, squatting, stairs, transfers, bed mobility, and bathing  PARTICIPATION LIMITATIONS: cleaning, medication management, interpersonal relationship, driving, shopping, and community activity  PERSONAL FACTORS: Age, Behavior pattern, Education, and Past/current experiences are also affecting patient's functional outcome.   REHAB POTENTIAL: Good  CLINICAL DECISION MAKING: Stable/uncomplicated  EVALUATION COMPLEXITY: Moderate  PLAN:  PT FREQUENCY: 1-2x/week  PT DURATION: 6 weeks  PLANNED INTERVENTIONS: 97110-Therapeutic exercises, 97530- Therapeutic activity, [id number]- Neuromuscular re-education, 97535- Self Care, 16109- Manual therapy, [id number]- Subsequent splinting/medication, 97014- Electrical stimulation (unattended), [id number]- Electrical stimulation (manual), 60454- Traction (mechanical), Patient/Family education, and Balance training  PLAN FOR NEXT SESSION: FU on resumption of HEP; any strength testing or joint ROM seen as relevant.  Perform hip flexor strengthening exercises.   Nolon Bussing, PT, DPT Physical Therapist - [facility]  03/20/23, 4:04 PM

## 2023-03-26 ENCOUNTER — Ambulatory Visit: Payer: 59

## 2023-04-02 ENCOUNTER — Ambulatory Visit (INDEPENDENT_AMBULATORY_CARE_PROVIDER_SITE_OTHER): Payer: 59 | Admitting: Obstetrics

## 2023-04-02 ENCOUNTER — Encounter: Payer: Self-pay | Admitting: Obstetrics

## 2023-04-02 VITALS — BP 125/77 | HR 86 | Ht 65.5 in | Wt 196.4 lb

## 2023-04-02 DIAGNOSIS — N3944 Nocturnal enuresis: Secondary | ICD-10-CM

## 2023-04-02 DIAGNOSIS — N952 Postmenopausal atrophic vaginitis: Secondary | ICD-10-CM

## 2023-04-02 DIAGNOSIS — N3941 Urge incontinence: Secondary | ICD-10-CM

## 2023-04-02 DIAGNOSIS — R350 Frequency of micturition: Secondary | ICD-10-CM

## 2023-04-02 DIAGNOSIS — G801 Spastic diplegic cerebral palsy: Secondary | ICD-10-CM | POA: Diagnosis not present

## 2023-04-02 DIAGNOSIS — R159 Full incontinence of feces: Secondary | ICD-10-CM

## 2023-04-02 DIAGNOSIS — N3945 Continuous leakage: Secondary | ICD-10-CM

## 2023-04-02 LAB — POCT URINALYSIS DIPSTICK
Bilirubin, UA: NEGATIVE
Blood, UA: NEGATIVE
Glucose, UA: POSITIVE — AB
Ketones, UA: NEGATIVE
Leukocytes, UA: NEGATIVE
Nitrite, UA: NEGATIVE
Protein, UA: NEGATIVE
Spec Grav, UA: <=1.005 — AB (ref 1.010–1.025)
Urobilinogen, UA: 0.2 U/dL
pH, UA: 6.5 (ref 5.0–8.0)

## 2023-04-02 MED ORDER — GEMTESA 75 MG PO TABS
75.0000 mg | ORAL_TABLET | Freq: Every day | ORAL | 2 refills | Status: DC
Start: 2023-04-02 — End: 2023-04-10

## 2023-04-02 MED ORDER — GEMTESA 75 MG PO TABS
75.0000 mg | ORAL_TABLET | Freq: Every day | ORAL | Status: DC
Start: 2023-04-02 — End: 2023-04-04

## 2023-04-02 NOTE — Assessment & Plan Note (Signed)
>>  ASSESSMENT AND PLAN FOR CEREBRAL PALSY (HCC) WRITTEN ON 04/02/2023  4:39 PM BY Olena Leatherwood, Sasha Rueth T, MD  - diagnosed at 52yo per patient, established with urology Dr. Arlester Marker with her neurologist  - denies history of urodynamics, recommended follow-up with Dr. Arlester Marker for video or UDS to assess bladder compliance

## 2023-04-02 NOTE — Assessment & Plan Note (Signed)
-   POCT + glucose likely due to T2DM control and medication, discussed increased association with urinary leakage and suboptimal glycemic control - We discussed the symptoms of overactive bladder (OAB), which include urinary urgency, urinary frequency, nocturia, with or without urge incontinence.  While we do not know the exact etiology of OAB, several treatment options exist. We discussed management including behavioral therapy (decreasing bladder irritants, urge suppression strategies, timed voids, bladder retraining), physical therapy, medication; for refractory cases posterior tibial nerve stimulation, sacral neuromodulation, and intravesical botulinum toxin injection.  For anticholinergic medications, we discussed the potential side effects of anticholinergics including dry eyes, dry mouth, constipation, cognitive impairment and urinary retention. Will avoid due to constipation For Beta-3 agonist medication, we discussed the potential side effect of elevated blood pressure which is more likely to occur in individuals with uncontrolled hypertension. - increase mirabegron to 50mg  to reassess symptoms, advised pt to check BP with increased dose - switch to [facility] if no relief in 4 weeks and discontinue mirabegron - advised caffeine reduction and reduce fluid intake  - encouraged tight glycemic control

## 2023-04-02 NOTE — Assessment & Plan Note (Addendum)
-   diagnosed at 52yo per patient, established with urology Dr. Arlester Marker with her neurologist  - denies history of urodynamics, recommended follow-up with Dr. Arlester Marker for video or UDS to assess bladder compliance

## 2023-04-02 NOTE — Assessment & Plan Note (Signed)
-   reports loose stool on linzess with chronic history of constipation since childhood - tried fiber supplementation and miralax in the past without relief - Treatment options include anti-diarrhea medication (loperamide/ Imodium OTC or prescription lomotil), fiber supplements, physical therapy, and possible sacral neuromodulation or surgery.   - encouraged to resume fiber supplementation to improve stool consistency to reassess bowel leakage - discussed risk of neurogenic bowel with CP - encouraged pt to review medication for opioid induced constipation with pain mgmt physician

## 2023-04-02 NOTE — Patient Instructions (Addendum)
Please follow-up with Dr. Arlester Marker and you may need to undergo urodynamic testing.   Increase your mirabegron to 50mg  daily  We discussed the symptoms of overactive bladder (OAB), which include urinary urgency, urinary frequency, night-time urination, with or without urge incontinence.  We discussed management including behavioral therapy (decreasing bladder irritants by following a bladder diet, urge suppression strategies, timed voids, bladder retraining), physical therapy, medication; and for refractory cases posterior tibial nerve stimulation, sacral neuromodulation, and intravesical botulinum toxin injection.   For Beta-3 agonist medication, we discussed the potential side effect of elevated blood pressure which is more likely to occur in individuals with uncontrolled hypertension. You were given samples for Gemtesa 75 mg if you do not have relief from mirabegron 50mg  after 4 weeks.  It can take a month to start working so give it time, but if you have bothersome side effects call sooner and we can try a different medication.  Call us if you have trouble filling the prescription or if it's not covered by your insurance.  For night time frequency: - avoid fluid intake after 6pm - elevated your feet during the day or use compression socks to reduce lower extremity swelling - switch your diuretic (e.g. furosemide) dosing to 3-4pm - consider workup for sleep apnea  For vaginal atrophy (thinning of the vaginal tissue that can cause dryness and burning) and UTI prevention we discussed estrogen replacement in the form of vaginal cream.   Start vaginal estrogen therapy nightly for two weeks then 2 times weekly at night. This can be placed with your finger or an applicator inside the vagina and around the urethra.  Please let us know if the prescription is too expensive and we can look for alternative options.   Is vaginal estrogen therapy safe for me? Vaginal estrogen preparations act on the vaginal  skin, and only a very tiny amount is absorbed into the bloodstream (0.01%).  They work in a similar way to hand or face cream.  There is minimal absorption and they are therefore perfectly safe. If you have had breast cancer and have persistent troublesome symptoms which aren't settling with vaginal moisturisers and lubricants, local estrogen treatment may be a possibility, but consultation with your oncologist should take place first.   Please review medications for constipation due to opioid use with your pain management doctor  Accidental Bowel Leakage:  - Treatment options include anti-diarrhea medication (loperamide/ Imodium OTC or prescription lomotil), fiber supplements, physical therapy, and possible sacral neuromodulation or surgery.     Women should try to eat at least 21 to 25 grams of fiber a day, while men should aim for 30 to 38 grams a day. You can add fiber to your diet with food or a fiber supplement such as psyllium (metamucil), benefiber, or fibercon.   Here's a look at how much dietary fiber is found in some common foods. When buying packaged foods, check the Nutrition Facts label for fiber content. It can vary among brands.  Fruits Serving size Total fiber (grams)*  Raspberries 1 cup 8.0  Pear 1 medium 5.5  Apple, with skin 1 medium 4.5  Banana 1 medium 3.0  Orange 1 medium 3.0  Strawberries 1 cup 3.0   Vegetables Serving size Total fiber (grams)*  Green peas, boiled 1 cup 9.0  Broccoli, boiled 1 cup chopped 5.0  Turnip greens, boiled 1 cup 5.0  Brussels sprouts, boiled 1 cup 4.0  Potato, with skin, baked 1 medium 4.0  Sweet corn, boiled 1  cup 3.5  Cauliflower, raw 1 cup chopped 2.0  Carrot, raw 1 medium 1.5   Grains Serving size Total fiber (grams)*  Spaghetti, whole-wheat, cooked 1 cup 6.0  Barley, pearled, cooked 1 cup 6.0  Bran flakes 3/4 cup 5.5  Quinoa, cooked 1 cup 5.0  Oat bran muffin 1 medium 5.0  Oatmeal, instant, cooked 1 cup 5.0  Popcorn,  air-popped 3 cups 3.5  Brown rice, cooked 1 cup 3.5  Bread, whole-wheat 1 slice 2.0  Bread, rye 1 slice 2.0   Legumes, nuts and seeds Serving size Total fiber (grams)*  Split peas, boiled 1 cup 16.0  Lentils, boiled 1 cup 15.5  Black beans, boiled 1 cup 15.0  Baked beans, canned 1 cup 10.0  Chia seeds 1 ounce 10.0  Almonds 1 ounce (23 nuts) 3.5  Pistachios 1 ounce (49 nuts) 3.0  Sunflower kernels 1 ounce 3.0  *Rounded to nearest 0.5 gram. Source: [employer] for [employer], [employer]	[place]
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
## 2023-04-02 NOTE — Assessment & Plan Note (Signed)
-   For symptomatic vaginal atrophy options include lubrication with a water-based lubricant, personal hygiene measures and barrier protection against wetness, and estrogen replacement in the form of vaginal cream, vaginal tablets, or a time-released vaginal ring.   - resume vaginal estrogen, discussed negative UA for UTI and possibly due to vaginal atrophy

## 2023-04-02 NOTE — Assessment & Plan Note (Signed)
-   avoid fluid intake after 6pm - elevated your feet during the day or use compression socks to reduce lower extremity swelling - switch your diuretic (e.g. furosemide) dosing to 4-5pm - consider workup to rule out sleep apnea if no relief with OAB medication

## 2023-04-02 NOTE — Assessment & Plan Note (Addendum)
-   POCT + glucose, discussed association of glycemic control with urinary leakage - bladder scan 28mL with no sign of urinary retention - recommended urodynamics due to possible neurogenic bladder with cerebral palsy

## 2023-04-03 ENCOUNTER — Ambulatory Visit: Payer: 59 | Attending: Physical Medicine and Rehabilitation

## 2023-04-03 ENCOUNTER — Other Ambulatory Visit: Payer: Self-pay

## 2023-04-03 DIAGNOSIS — M545 Low back pain, unspecified: Secondary | ICD-10-CM | POA: Diagnosis present

## 2023-04-03 DIAGNOSIS — M5459 Other low back pain: Secondary | ICD-10-CM | POA: Insufficient documentation

## 2023-04-03 DIAGNOSIS — R2681 Unsteadiness on feet: Secondary | ICD-10-CM | POA: Insufficient documentation

## 2023-04-03 DIAGNOSIS — M25561 Pain in right knee: Secondary | ICD-10-CM | POA: Diagnosis present

## 2023-04-03 DIAGNOSIS — R262 Difficulty in walking, not elsewhere classified: Secondary | ICD-10-CM | POA: Diagnosis present

## 2023-04-03 DIAGNOSIS — G801 Spastic diplegic cerebral palsy: Secondary | ICD-10-CM | POA: Insufficient documentation

## 2023-04-03 DIAGNOSIS — M6281 Muscle weakness (generalized): Secondary | ICD-10-CM | POA: Insufficient documentation

## 2023-04-03 DIAGNOSIS — G8929 Other chronic pain: Secondary | ICD-10-CM | POA: Diagnosis present

## 2023-04-03 NOTE — Therapy (Signed)
OUTPATIENT PHYSICAL THERAPY TREATMENT   Patient Name: Traci Davis MRN: [id number] DOB:1970/07/06, 52 y.o., female Today's Date: 03/11/2023   PCP: Traci Pilar, DO REFERRING PROVIDER: Windle Guard, DO   END OF SESSION:  PT End of Session - 04/03/23 1523     Visit Number 3    Number of Visits 12    Date for PT Re-Evaluation 04/22/23    Authorization Type UHC dual complete; [place] Medicaid    Authorization Time Period 03/11/23-04/22/23    Progress Note Due on Visit 10    PT Start Time 1524    PT Stop Time 1605    PT Time Calculation (min) 41 min    Activity Tolerance Patient tolerated treatment well;No increased pain    Behavior During Therapy [facility] for tasks assessed/performed                Past Medical History:  Diagnosis Date   Acute pancreatitis 09/04/2014   Anxiety    Arthritis    Asthma    Cerebral palsy (HCC)    Complication of anesthesia    panic attacks before surgery   COPD (chronic obstructive pulmonary disease) (HCC)    Depression    Diabetes mellitus without complication (HCC)    GERD (gastroesophageal reflux disease)    Hypertension    Noninfectious gastroenteritis and colitis 01/10/2013    Past Surgical History:  Procedure Laterality Date   CESAREAN SECTION  1995   CHOLECYSTECTOMY     DILATATION & CURETTAGE/HYSTEROSCOPY WITH MYOSURE N/A 02/20/2018   Procedure: DILATATION & CURETTAGE/HYSTEROSCOPY WITH MYOSURE ENDOMETRIAL POLYPECTOMY;  Surgeon: Traci Novak, MD;  Location: [facility] ORS;  Service: Gynecology;  Laterality: N/A;   ESOPHAGOGASTRODUODENOSCOPY (EGD) WITH PROPOFOL N/A 01/01/2019   Procedure: ESOPHAGOGASTRODUODENOSCOPY (EGD) WITH PROPOFOL;  Surgeon: Traci Reil, MD;  Location: [facility] ENDOSCOPY;  Service: Gastroenterology;  Laterality: N/A;   FOOT CAPSULE RELEASE W/ PERCUTANEOUS HEEL CORD LENGTHENING, TIBIAL TENDON TRANSFER     age 52 ,and 2010-both legs   JOINT REPLACEMENT Right    both knees partial replacements dates  unknown   knee replacment     x 5 per pt. last one- left knee 2014. has had 2 on R 3 on L   TYMPANOPLASTY WITH GRAFT Right 01/08/2018   Procedure: TYMPANOPLASTY WITH POSSIBLE OSSICULAR CHAIN RECONSTRUCTION;  Surgeon: Traci Logan, MD;  Location: [facility] ORS;  Service: ENT;  Laterality: Right;    Patient Active Problem List   Diagnosis Date Noted   Urge urinary incontinence 04/02/2023   Incontinence of feces 04/02/2023   Vaginal atrophy 04/02/2023   Asthma, persistent controlled 02/06/2023   Continuous leakage of urine 01/04/2023   De Quervain's tenosynovitis, right 10/08/2022   Hot flashes due to menopause 07/17/2022   Convulsions (HCC) 07/09/2022   Obesity due to excess calories 09/22/2020   Insomnia due to medical condition 05/11/2020   Hamstring tendonitis 01/29/2020   MDD (major depressive disorder), recurrent, in full remission (HCC) 08/12/2019   Seizure-like activity (HCC) 07/08/2019   Mild episode of recurrent major depressive disorder (HCC) 12/29/2018   Insomnia due to mental condition 12/29/2018   Disorder of vein 03/04/2018   Lumbar sprain 03/04/2018   Muscle weakness 03/04/2018   Neck sprain 03/04/2018   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   Postmenopausal bleeding 02/18/2018   Impingement syndrome of shoulder region 02/04/2018   Chest pain with moderate risk for cardiac etiology 05/19/2017   GAD (generalized anxiety disorder) 04/15/2017   Chronic daily headache 04/15/2017   Panic attack 04/15/2017  Nocturnal enuresis 04/15/2017   Mild cognitive impairment 04/15/2017   Loss of memory 01/14/2017   Pain medication agreement signed 01/08/2017   Headache disorder 12/04/2016   Chronic, continuous use of opioids 07/01/2015   Vertigo 07/01/2015   Chronic midline low back pain without sciatica 03/21/2015   Type 2 diabetes mellitus with other specified complication (HCC) 09/05/2014   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   Hip pain, chronic 04/28/2013   Essential  hypertension 01/10/2013   Irritable colon 01/10/2013   Encounter for long-term (current) use of other medications 12/04/2012   Chronic GERD 08/12/2012   Diarrhea 08/12/2012   Primary localized osteoarthrosis, lower leg 05/07/2012   Difficulty walking 02/06/2012     ONSET DATE: chronic, congential   REFERRING DIAG: spastic diplegic CP; Rt leg pain; Lt leg pain   THERAPY DIAG:  Difficulty in walking, not elsewhere classified  Rationale for Evaluation and Treatment: Rehabilitation    SUBJECTIVE:                                                                                                                                                                                             SUBJECTIVE STATEMENT:  Pt reports she is physically tired.  Pt notes she had an MD appointment yesterday that was scheduled for 1:30 and she was there until 4:30 (not brought back until 3:40).  Pt reports that even walking is difficult.   PERTINENT HISTORY:  Traci Davis is a 52yoF who is referred to this clinic after a period of immobility and subsequent weakness upon resumption. Pt had a Rt wrist surgery in September and did not feel safe AMB with wrist splint. Pt reports her legs are much weakness now and she is having trouble mobilizing at her baseline. Pt familiar to this clinic from prior episodes.   PAIN:  Are you having pain? Yes, bilat leg pain, 7/10 bilat anterior thighs progressive with walking, resolves with rest/sitting. Pt reports chronic left lower leg pain which has been responsive to gabapentin for several weeks now.     PRECAUTIONS: Wears Left KAFO, Rt knee OA brace; has been released from Rt wrist splint but wears it when mobilizing due to soreness and fear of hitting it on something.  WEIGHT BEARING RESTRICTIONS: WBAT RUE  FALLS: Has patient fallen in last 6 months? 3 falls in past 6 months    PLOF: mod I AMB at baseline; platform walking in household, sometimes short community  distances; power scooter or manual WC for community distance AMB; pt drives  PATIENT GOALS: help return to her baseline in AMB overground   OBJECTIVE:  Note: Objective  measures were completed at Evaluation unless otherwise noted.  LOWER EXTREMITY ROM:    P/ROM  Right Eval Left Eval  Hip flexion    Hip extension    Hip abduction    Hip adduction    Hip internal rotation    Hip external rotation    Knee flexion    Knee extension    Ankle dorsiflexion    Ankle plantarflexion    Ankle inversion    Ankle eversion      LOWER EXTREMITY STRENGTH:    MMT Right Eval Left Eval  Hip flexion    Hip extension    Hip abduction    Hip adduction    Hip internal rotation    Hip external rotation    Knee flexion    Knee extension    Ankle dorsiflexion    Ankle plantarflexion    Ankle inversion    Ankle eversion    (Blank rows = not tested)  BED MOBILITY:  Sleeps in a regular bed, uses a trapeze for repositioning; no adjustment, no bed rails   TRANSFERS: Assistive device utilized: ModI STS form platform walking   RAMP:  Level of Assistance: can typically manage c with WC or with platform walker   CURB:  Level of Assistance: can typically manage c with WC or with platform walker   STAIRS: Level of Assistance: does not AMB stairs at baseline   GAIT: Gait pattern: double platform walker with with Left KAFO, but will occasionally AMB the household without KAFO while in socks.  FUNCTIONAL TESTS:  -3xSTS from chair: hands ad lib, walker ad lib, Left KAFO, Rt knee brace: 19.95sec -141ft AMB: Left AFO, double platform RW; 2 minutes, 5 seconds (0.83m/s)   TODAY'S TREATMENT:    TherAct:  Pt ambulated 2 laps around the gym for goal assessment noted below.   TherEx:  Seated LAQ, 2x10 each LE Seated hamstring curls with YTB, 2x10 Seated hip adduction into green physioball, 3 sec holds, 2x10 Seated resisted abduction into green physioball, 2x10 STS x10 with brace  unlocked   PATIENT EDUCATION: Education details: Patient  Person educated: plan for assessment this date and how to generate goals for improvement Education method: Discussion Education comprehension: good   HOME EXERCISE PROGRAM: Access Code: [email or web address] URL: [email or web address] Date: 03/11/2023 Prepared by: Alvera Novel  Exercises - Seated Hip Abduction with Resistance  - 1 x daily - 7 x weekly - 2 sets - 10 reps - Seated Hip Adduction Isometrics with Ball  - 1 x daily - 7 x weekly - 3 sets - 10 reps - 5 seconds hold - Seated Long Arc Quad with Strap  - 2 x daily - 4 x weekly - 4 sets - 5 reps - 5 seconds hold - Sit to Stand with Counter Support  - 2 x daily - 7 x weekly - 1 sets - 5 reps  GOALS: Goals reviewed with patient? Yes  SHORT TERM GOALS: Target date: 03/31/23  Pt to perform 3xSTS in <16 sec  Baseline: eval: 19.95sec  Goal status: INITIAL  2.  Pt to report regular performance of starter HEP at home and readiness/confidence to progress to additional standing activity.  Baseline: issued at eval, not regularly performing prior.  Goal status: INITIAL  3.  Pt to demonstrate 181ft AMB in clinic in <1 minute 40 seconds.  Baseline: eval: 31m5s Goal status: INITIAL   LONG TERM GOALS: Target date: 04/22/23  Pt to perform 5xSTS in <19.95 sec  Baseline: eval:  3xSTS in 19.95sec Goal status: INITIAL  2.  Pt to demonstrate tolerance of 255ft AMB without rest break, average speed faster than 0.30m/s Baseline: 0.29 m/s Goal status: INITIAL  3.  Pt to improve score on MobQoL report measure by >20% to indicate improved mobility-specific quality of life.  Baseline: 03/11/23: 0.54 Goal status: INITIAL    ASSESSMENT:  CLINICAL IMPRESSION:  Pt continues to have increased difficulty with ambulation and notably hip ER/abduction.  Pt notes it to continue to be a struggle to stay consistent with exercises at home and was given encouragement for performing  scheduled tasks at gym or [employer] in order to improve endurance levels.  Pt given resources in order to improve upon this area of difficulty.   Pt will continue to benefit from skilled therapy to address remaining deficits in order to improve overall QoL and return to PLOF.      OBJECTIVE IMPAIRMENTS: Abnormal gait, cardiopulmonary status limiting activity, decreased activity tolerance, decreased balance, decreased coordination, decreased endurance, decreased mobility, difficulty walking, decreased ROM, and decreased strength.   ACTIVITY LIMITATIONS: standing, squatting, stairs, transfers, bed mobility, and bathing  PARTICIPATION LIMITATIONS: cleaning, medication management, interpersonal relationship, driving, shopping, and community activity  PERSONAL FACTORS: Age, Behavior pattern, Education, and Past/current experiences are also affecting patient's functional outcome.   REHAB POTENTIAL: Good  CLINICAL DECISION MAKING: Stable/uncomplicated  EVALUATION COMPLEXITY: Moderate  PLAN:  PT FREQUENCY: 1-2x/week  PT DURATION: 6 weeks  PLANNED INTERVENTIONS: 97110-Therapeutic exercises, 97530- Therapeutic activity, [id number]- Neuromuscular re-education, 97535- Self Care, [id number]- Manual therapy, [id number]- Subsequent splinting/medication, 97014- Electrical stimulation (unattended), [id number]- Electrical stimulation (manual), 95284- Traction (mechanical), Patient/Family education, and Balance training  PLAN FOR NEXT SESSION:  any strength testing or joint ROM seen as relevant.  Perform hip flexor strengthening exercises.   Nolon Bussing, PT, DPT Physical Therapist - [facility]  04/03/23, 3:24 PM

## 2023-04-04 ENCOUNTER — Ambulatory Visit: Payer: 59 | Admitting: Psychiatry

## 2023-04-04 ENCOUNTER — Encounter: Payer: Self-pay | Admitting: Psychiatry

## 2023-04-04 VITALS — BP 126/83 | HR 82 | Temp 98.1°F | Ht 62.0 in | Wt 195.0 lb

## 2023-04-04 DIAGNOSIS — F3342 Major depressive disorder, recurrent, in full remission: Secondary | ICD-10-CM

## 2023-04-04 DIAGNOSIS — G4701 Insomnia due to medical condition: Secondary | ICD-10-CM | POA: Diagnosis not present

## 2023-04-04 DIAGNOSIS — F411 Generalized anxiety disorder: Secondary | ICD-10-CM | POA: Diagnosis not present

## 2023-04-04 NOTE — Progress Notes (Signed)
BH MD OP Progress Note  04/04/2023 11:49 AM Traci Davis  MRN:  [id number]  Chief Complaint:  Chief Complaint  Patient presents with   Follow-up   Anxiety   Depression   Medication Refill   HPI: Traci Davis is a 52 year old Caucasian female, lives in [facility], has a history of MDD, GAD, insomnia, cerebral palsy, diabetes mellitus on SSD was evaluated in office today.  The patient presents with ongoing physical and emotional distress. She recently underwent wrist surgery, which has healed well. However, she reports persistent issues with her legs, particularly the right knee, which has been collapsing and causing significant pain. This has resulted in an inability to walk for about a week at a time, requiring periods of rest. The patient has been provided with a brace to protect the knee and prevent it from twisting, a problem she has experienced twice before.  The patient's emotional distress is largely due to situational stressors, primarily related to her living situation. She currently resides with a controlling boyfriend, which has been causing significant anxiety. The patient is planning to move in with her daughter as soon as financially possible, but this has been causing additional stress and insomnia. The patient's boyfriend is aware of these plans but is not supportive.  The patient's living situation is causing significant distress, and she is actively seeking a solution. She has a support network, including a friend living nearby and her daughter, who she plans to move in with. She is also considering therapy to help manage her situational stress and anxiety.  In terms of medication, the patient is currently on buspirone, Prozac, lamotrigine, and mirtazapine for her mental health conditions. She reports no side effects and feels that these medications are helping. She has also been prescribed gabapentin for neuropathy in her legs, which she believes is helping with nerve pain.  She has recently been prescribed Myrbetriq for bladder issues related to her cerebral palsy, and she has been given Gemtesa to try if the Myrbetriq does not work.  Patient denies any suicidality, homicidality or perceptual disturbances.  Patient appeared to be alert, oriented to person place time situation.    Visit Diagnosis:    ICD-10-CM   1. MDD (major depressive disorder), recurrent, in full remission (HCC)  F33.42     2. GAD (generalized anxiety disorder)  F41.1     3. Insomnia due to medical condition  G47.01    Pain, mood      Past Psychiatric History: I have reviewed past psychiatric history from progress note on 09/12/2017.  Past trials of Xanax, Klonopin, Cymbalta, Rozerem, Ambien, trazodone, doxepin.  Past Medical History:  Past Medical History:  Diagnosis Date   Acute pancreatitis 09/04/2014   Anxiety    Arthritis    Asthma    Cerebral palsy (HCC)    Complication of anesthesia    panic attacks before surgery   COPD (chronic obstructive pulmonary disease) (HCC)    Depression    Diabetes mellitus without complication (HCC)    GERD (gastroesophageal reflux disease)    Hypertension    Noninfectious gastroenteritis and colitis 01/10/2013    Past Surgical History:  Procedure Laterality Date   CESAREAN SECTION  1995   CHOLECYSTECTOMY     DILATATION & CURETTAGE/HYSTEROSCOPY WITH MYOSURE N/A 02/20/2018   Procedure: DILATATION & CURETTAGE/HYSTEROSCOPY WITH MYOSURE ENDOMETRIAL POLYPECTOMY;  Surgeon: Conard Novak, MD;  Location: [facility] ORS;  Service: Gynecology;  Laterality: N/A;   ESOPHAGOGASTRODUODENOSCOPY (EGD) WITH PROPOFOL N/A 01/01/2019  Procedure: ESOPHAGOGASTRODUODENOSCOPY (EGD) WITH PROPOFOL;  Surgeon: Toney Reil, MD;  Location: [facility] ENDOSCOPY;  Service: Gastroenterology;  Laterality: N/A;   FOOT CAPSULE RELEASE W/ PERCUTANEOUS HEEL CORD LENGTHENING, TIBIAL TENDON TRANSFER     age 47 ,and 2010-both legs   JOINT REPLACEMENT Right    both knees  partial replacements dates unknown   knee replacment     x 5 per pt. last one- left knee 2014. has had 2 on R 3 on L   TYMPANOPLASTY WITH GRAFT Right 01/08/2018   Procedure: TYMPANOPLASTY WITH POSSIBLE OSSICULAR CHAIN RECONSTRUCTION;  Surgeon: Geanie Logan, MD;  Location: [facility] ORS;  Service: ENT;  Laterality: Right;    Family Psychiatric History: I have reviewed family psychiatric history from progress note on 09/12/2017.  Family History:  Family History  Problem Relation Age of Onset   CAD Father    Heart attack Brother    Sexual abuse Paternal Grandfather    Breast cancer Neg Hx     Social History: I have reviewed social history from progress note on 09/12/2017. Social History   Socioeconomic History   Marital status: Single    Spouse name: Not on file   Number of children: 1   Years of education: Not on file   Highest education level: Associate degree: occupational, Scientist, product/process development, or vocational program  Occupational History   Not on file  Tobacco Use   Smoking status: Never   Smokeless tobacco: Never  Vaping Use   Vaping status: Never Used  Substance and Sexual Activity   Alcohol use: No   Drug use: No   Sexual activity: Yes    Partners: Male    Birth control/protection: Condom  Other Topics Concern   Not on file  Social History Narrative   Not on file   Social Determinants of Health   Financial Resource Strain: Medium Risk (01/10/2022)   Received [facility], [facility]   Overall Financial Resource Strain (CARDIA)    Difficulty of Paying Living Expenses: Somewhat hard  Food Insecurity: Food Insecurity Present (01/10/2022)   Received from [facility], [facility]   Hunger Vital Sign    Worried About Running Out of Food in the Last Year: Sometimes true    Ran Out of Food in the Last Year: Never true  Transportation Needs: No Transportation Needs (01/10/2022)   Received from [facility], [facility]   PRAPARE - Transportation    Lack of  Transportation (Medical): No    Lack of Transportation (Non-Medical): No  Physical Activity: Not on file  Stress: Not on file  Social Connections: Not on file    Allergies:  Allergies  Allergen Reactions   Meloxicam Other (See Comments)    Damage kidney   Morphine And Codeine Other (See Comments)    hallucinations   Vantin [Cefpodoxime] Nausea And Vomiting   Latex Itching   Baclofen Other (See Comments)    "makes cerebral palsy do adverse reaction on me", tightens muscles    Ciprofloxacin Itching and Nausea And Vomiting   Prednisone Other (See Comments)    Elevated blood glucose   Tape Rash    skin tears.  Paper tape is ok   Tramadol Nausea Only    Metabolic Disorder Labs: Lab Results  Component Value Date   HGBA1C 8 01/22/2023   No results found for: "PROLACTIN" No results found for: "CHOL", "TRIG", "HDL", "CHOLHDL", "VLDL", "LDLCALC" Lab Results  Component Value Date   TSH 2.198  07/10/2016   TSH 2.891 02/19/2015    Therapeutic Level Labs: No results found for: "LITHIUM" No results found for: "VALPROATE" No results found for: "CBMZ"  Current Medications: Current Outpatient Medications  Medication Sig Dispense Refill   acetaminophen (TYLENOL) 325 MG tablet Take 2 tablets (650 mg total) by mouth every 6 (six) hours as needed. 60 tablet 0   albuterol (PROVENTIL HFA;VENTOLIN HFA) 108 (90 BASE) MCG/ACT inhaler Inhale 2 puffs into the lungs 4 (four) times daily as needed. For shortness of breath and/or wheezing     amLODipine (NORVASC) 5 MG tablet Take 5 mg by mouth.     atorvastatin (LIPITOR) 20 MG tablet Take 1 tablet (20 mg total) by mouth daily. 90 tablet 3   azelastine (ASTELIN) 0.1 % nasal spray Place into both nostrils.     Blood Glucose Monitoring Suppl (GLUCOCOM BLOOD GLUCOSE MONITOR) DEVI Test daily before all meals/snacks and once before bedtime.     budesonide-formoterol (SYMBICORT) 80-4.5 MCG/ACT inhaler Inhale 2 puffs up to 4 times daily as needed for  symptoms of wheezing or shortness of breath.     busPIRone (BUSPAR) 10 MG tablet Take 1 tablet (10 mg total) by mouth 3 (three) times daily. 90 tablet 1   Continuous Blood Gluc Receiver (DEXCOM G6 RECEIVER) DEVI 1 Units by Miscellaneous route continuous.     Continuous Blood Gluc Transmit (DEXCOM G6 TRANSMITTER) MISC 1 Units by Miscellaneous route Every three (3) months.     Continuous Glucose Sensor (DEXCOM G7 SENSOR) MISC Apply Dexcom G7 sensor every 10 days. Type 2 Diabetes uncontrolled (E11.65)     CREON 36000 units CPEP capsule 3 capsules in the morning, at noon, and at bedtime.     cyclobenzaprine (FLEXERIL) 10 MG tablet Take 10 mg by mouth 3 (three) times daily.     diazepam (VALIUM) 5 MG tablet Take 5 mg by mouth. PRN with injecitons     donepezil (ARICEPT) 10 MG tablet Take 10 mg by mouth at bedtime.     estradiol (ESTRACE) 0.1 MG/GM vaginal cream Place 1 Applicatorful vaginally.     FLUoxetine (PROZAC) 20 MG capsule Take 1 capsule (20 mg total) by mouth daily. Take along with 40 mg daily - total of 60 mg daily 90 capsule 1   FLUoxetine (PROZAC) 40 MG capsule Take 1 capsule (40 mg total) by mouth daily. Take along with 20 mg daily. 90 capsule 1   furosemide (LASIX) 40 MG tablet Take 40 mg by mouth daily as needed for fluid. Taking twice a week     gabapentin (NEURONTIN) 300 MG capsule Take 600 mg by mouth 2 (two) times daily.     glucose blood test strip USE TO TEST BLOOD SUGAR TWO TIMES A DAY     GVOKE HYPOPEN 2-PACK 1 MG/0.2ML SOAJ Inject 1 mg into the skin as needed (hypoglycemia). 1 mL 0   incobotulinumtoxinA (XEOMIN) 100 units SOLR injection Inject 100 Units into the muscle every 3 (three) months.      Incontinence Supply Disposable (PREVAIL BLADDER CONTROL PAD) MISC Large incontinence pads to go into underwear     Insulin Pen Needle (GLOBAL EASE INJECT PEN NEEDLES) 31G X 5 MM MISC Use pen needle to inject insulin daily. 100 each 0   JARDIANCE 10 MG TABS tablet Take 1 tablet (10 mg  total) by mouth daily before breakfast. 90 tablet 1   lamoTRIgine (LAMICTAL) 25 MG tablet Take 1 tablet (25 mg total) by mouth daily. 90 tablet 0  Lancets (FREESTYLE) lancets Test daily before all meals/snacks     lidocaine (LIDODERM) 5 % Place 2 patches onto the skin daily.     LINZESS 145 MCG CAPS capsule Take 145 mcg by mouth daily before breakfast.     lisinopril (ZESTRIL) 20 MG tablet Take 20 mg by mouth daily.     metFORMIN (GLUCOPHAGE-XR) 500 MG 24 hr tablet Take 1,000 mg by mouth 2 (two) times daily.     mirabegron ER (MYRBETRIQ) 25 MG TB24 tablet Take 2 tablets by mouth daily.     mirtazapine (REMERON) 15 MG tablet Take 1.5 tablets (22.5 mg total) by mouth at bedtime. 135 tablet 1   naloxone (NARCAN) nasal spray 4 mg/0.1 mL      nystatin (MYCOSTATIN/NYSTOP) powder Apply topically.     omeprazole (PRILOSEC) 40 MG capsule Take 40 mg by mouth in the morning and at bedtime.     ondansetron (ZOFRAN) 4 MG tablet TAKE 1 TABLET BY MOUTH ONCE DAILY AS NEEDED FOR NAUSEA OR VOMITING 30 tablet 1   oxyCODONE ER 13.5 MG C12A Take by mouth.     oxyCODONE-acetaminophen (PERCOCET) 10-325 MG tablet Take 1 tablet by mouth 3 (three) times daily as needed.     SUMAtriptan (IMITREX) 100 MG tablet Take 100 mg by mouth as directed.     triamcinolone cream (KENALOG) 0.1 % Apply 1 Application topically 3 (three) times daily.     Vibegron (GEMTESA) 75 MG TABS Take 1 tablet (75 mg total) by mouth daily. 30 tablet 2   insulin glargine (LANTUS) 100 UNIT/ML Solostar Pen Inject 10 Units into the skin daily.     oxyCODONE (OXY IR/ROXICODONE) 5 MG immediate release tablet Take by mouth. (Patient not taking: Reported on 04/04/2023)     No current facility-administered medications for this visit.     Musculoskeletal: Strength & Muscle Tone:  has a h/o of cerebral palsy  Gait & Station:  slow, unsteady Patient leans: N/A  Psychiatric Specialty Exam: Review of Systems  Psychiatric/Behavioral:  Positive for sleep  disturbance. The patient is nervous/anxious.     Blood pressure 126/83, pulse 82, temperature 98.1 F (36.7 C), height 5\' 2"  (1.575 m), weight 195 lb (88.5 kg), last menstrual period 08/20/2014, SpO2 97%.Body mass index is 35.67 kg/m.  General Appearance: Fairly Groomed  Eye Contact:  Fair  Speech:  Clear and Coherent  Volume:  Normal  Mood:  Anxious  Affect:  Congruent  Thought Process:  Goal Directed and Descriptions of Associations: Intact  Orientation:  Full (Time, Place, and Person)  Thought Content: Logical   Suicidal Thoughts:  No  Homicidal Thoughts:  No  Memory:  Immediate;   Fair Recent;   Fair Remote;   Fair  Judgement:  Fair  Insight:  Fair  Psychomotor Activity:  Normal  Concentration:  Concentration: Fair and Attention Span: Fair  Recall:  [employer] of Knowledge: Fair  Language: Fair  Akathisia:  No  Handed:  Right  AIMS (if indicated): not done  Assets:  Communication Skills Desire for Improvement Housing Social Support  ADL's:  Intact  Cognition: WNL  Sleep:   Restless due to situational stressors   Screenings: AIMS    Flowsheet Row Office Visit from 04/05/2022 in [facility] Office Visit from 10/16/2021 in [facility]  AIMS Total Score 0 0      GAD-7    Flowsheet Row Video Visit from 12/06/2022 in [facility] Office Visit from 04/05/2022 in [facility] Video Visit from 06/06/2021 in [facility]  Total GAD-7 Score 5 8 1       PHQ2-9    Flowsheet Row Office Visit from 02/27/2023 in [facility] Video Visit from 12/06/2022 in [facility] Office Visit from 04/05/2022 in [facility] Video Visit from 11/13/2021 in [facility] Office Visit from 10/16/2021 in [facility]  PHQ-2 Total Score 0 0 2 0 1  PHQ-9 Total Score -- -- 9 -- 5      Flowsheet Row Office Visit from 04/04/2023 in [facility] Video Visit from 02/12/2023 in [facility] ED from 01/17/2023 in [facility] Emergency Department at [facility]  C-SSRS RISK CATEGORY No Risk No Risk No Risk        Assessment and Plan: Traci Davis is a 52 year old Caucasian female who has a history of depression, anxiety, chronic pain was evaluated in office today.  Patient is currently struggling with multiple medical problems including pain and discomfort status post hand surgery-right sided for carpal tunnel/bursitis, currently struggling with relationship struggles with boyfriend, housing problem, will benefit from psychotherapy sessions, medication management as noted below, plan as noted below.   Major Depression in remission Major depression managed with Prozac 60 mg daily, lamotrigine 25 mg, and mirtazapine 22.5 mg. Reports situational stressors related to living situation and relationship, contributing to anxiety and sleep disturbances. Plans to move in with daughter to escape a controlling relationship, anticipated to improve mental health. - Continue Prozac 60 mg daily - Continue Lamotrigine 25 mg daily - Continue Mirtazapine 22.5 mg daily - Recommend therapy; patient to schedule with our therapist. - Encourage development of a plan for moving out of current living situation to reduce stress  Generalized Anxiety Disorder-unstable Generalized anxiety disorder managed with buspirone 10 mg three times a day. Reports ongoing anxiety related to situational stressors, including living situation and relationship. Plans to move in with daughter to reduce stress and improve mental health. - Continue buspirone 10 mg three times a  day - Recommend therapy - Encourage development of a plan for moving out of current living situation to reduce stress  Insomnia-unstable Chronic insomnia exacerbated by situational stressors. Reports difficulty sleeping due to anxiety and stress related to living situation. Plans to move in with daughter to reduce stress and improve sleep. - Continue mirtazapine 22.5 mg for sleep - Recommend therapy - Encourage development of a plan for moving out of current living situation to reduce stress  Advised to maintain regular follow-ups and manage medications effectively. - Schedule follow-up appointment   Collaboration of Care: Collaboration of Care: Referral or follow-up with counselor/therapist AEB patient encouraged to establish care with therapist.  Patient/Guardian was advised Release of Information must be obtained prior to any record release in order to collaborate their care with an outside provider. Patient/Guardian was advised if they have not already done so to contact the registration department to sign all necessary forms in order for [place] to release information regarding their care.   Consent: Patient/Guardian gives verbal consent for treatment and assignment of benefits for services provided during this visit. Patient/Guardian expressed understanding and agreed to proceed.   Follow-up in clinic in 4 weeks or sooner if needed.  This note was generated in part or  whole with voice recognition software. Voice recognition is usually quite accurate but there are transcription errors that can and very often do occur. I apologize for any typographical errors that were not detected and corrected.    Jomarie Longs, MD 04/05/2023, 8:09 AM

## 2023-04-08 ENCOUNTER — Ambulatory Visit: Payer: 59

## 2023-04-09 ENCOUNTER — Ambulatory Visit: Payer: 59 | Admitting: Physical Therapy

## 2023-04-09 ENCOUNTER — Telehealth: Payer: 59 | Admitting: Physician Assistant

## 2023-04-09 ENCOUNTER — Ambulatory Visit: Payer: Self-pay | Admitting: *Deleted

## 2023-04-09 DIAGNOSIS — M6281 Muscle weakness (generalized): Secondary | ICD-10-CM

## 2023-04-09 DIAGNOSIS — M5459 Other low back pain: Secondary | ICD-10-CM

## 2023-04-09 DIAGNOSIS — G8929 Other chronic pain: Secondary | ICD-10-CM

## 2023-04-09 DIAGNOSIS — R262 Difficulty in walking, not elsewhere classified: Secondary | ICD-10-CM

## 2023-04-09 DIAGNOSIS — B379 Candidiasis, unspecified: Secondary | ICD-10-CM

## 2023-04-09 DIAGNOSIS — G801 Spastic diplegic cerebral palsy: Secondary | ICD-10-CM

## 2023-04-09 DIAGNOSIS — R2681 Unsteadiness on feet: Secondary | ICD-10-CM

## 2023-04-09 MED ORDER — FLUCONAZOLE 150 MG PO TABS
150.0000 mg | ORAL_TABLET | ORAL | 0 refills | Status: DC | PRN
Start: 2023-04-09 — End: 2023-05-07

## 2023-04-09 NOTE — Patient Instructions (Signed)
Fanny Bien, thank you for joining Margaretann Loveless, PA-C for today's virtual visit.  While this provider is not your primary care provider (PCP), if your PCP is located in our provider database this encounter information will be shared with them immediately following your visit.   A [facility] MyChart account gives you access to today's visit and all your visits, tests, and labs performed at [facility] " click here if you don't have a [facility] MyChart account or go to [email or web address]  Consent: (Patient) Fanny Bien provided verbal consent for this virtual visit at the beginning of the encounter.  Current Medications:  Current Outpatient Medications:    fluconazole (DIFLUCAN) 150 MG tablet, Take 1 tablet (150 mg total) by mouth every 3 (three) days as needed., Disp: 2 tablet, Rfl: 0   acetaminophen (TYLENOL) 325 MG tablet, Take 2 tablets (650 mg total) by mouth every 6 (six) hours as needed., Disp: 60 tablet, Rfl: 0   albuterol (PROVENTIL HFA;VENTOLIN HFA) 108 (90 BASE) MCG/ACT inhaler, Inhale 2 puffs into the lungs 4 (four) times daily as needed. For shortness of breath and/or wheezing, Disp: , Rfl:    amLODipine (NORVASC) 5 MG tablet, Take 5 mg by mouth., Disp: , Rfl:    atorvastatin (LIPITOR) 20 MG tablet, Take 1 tablet (20 mg total) by mouth daily., Disp: 90 tablet, Rfl: 3   azelastine (ASTELIN) 0.1 % nasal spray, Place into both nostrils., Disp: , Rfl:    Blood Glucose Monitoring Suppl (GLUCOCOM BLOOD GLUCOSE MONITOR) DEVI, Test daily before all meals/snacks and once before bedtime., Disp: , Rfl:    budesonide-formoterol (SYMBICORT) 80-4.5 MCG/ACT inhaler, Inhale 2 puffs up to 4 times daily as needed for symptoms of wheezing or shortness of breath., Disp: , Rfl:    busPIRone (BUSPAR) 10 MG tablet, Take 1 tablet (10 mg total) by mouth 3 (three) times daily., Disp: 90 tablet, Rfl: 1   Continuous Blood Gluc Receiver (DEXCOM G6 RECEIVER) DEVI, 1 Units by  Miscellaneous route continuous., Disp: , Rfl:    Continuous Blood Gluc Transmit (DEXCOM G6 TRANSMITTER) MISC, 1 Units by Miscellaneous route Every three (3) months., Disp: , Rfl:    Continuous Glucose Sensor (DEXCOM G7 SENSOR) MISC, Apply Dexcom G7 sensor every 10 days. Type 2 Diabetes uncontrolled (E11.65), Disp: , Rfl:    CREON 36000 units CPEP capsule, 3 capsules in the morning, at noon, and at bedtime., Disp: , Rfl:    cyclobenzaprine (FLEXERIL) 10 MG tablet, Take 10 mg by mouth 3 (three) times daily., Disp: , Rfl:    diazepam (VALIUM) 5 MG tablet, Take 5 mg by mouth. PRN with injecitons, Disp: , Rfl:    donepezil (ARICEPT) 10 MG tablet, Take 10 mg by mouth at bedtime., Disp: , Rfl:    estradiol (ESTRACE) 0.1 MG/GM vaginal cream, Place 1 Applicatorful vaginally., Disp: , Rfl:    FLUoxetine (PROZAC) 20 MG capsule, Take 1 capsule (20 mg total) by mouth daily. Take along with 40 mg daily - total of 60 mg daily, Disp: 90 capsule, Rfl: 1   FLUoxetine (PROZAC) 40 MG capsule, Take 1 capsule (40 mg total) by mouth daily. Take along with 20 mg daily., Disp: 90 capsule, Rfl: 1   furosemide (LASIX) 40 MG tablet, Take 40 mg by mouth daily as needed for fluid. Taking twice a week, Disp: , Rfl:    gabapentin (NEURONTIN) 300 MG capsule, Take 600 mg by mouth 2 (two) times daily., Disp: , Rfl:    glucose  blood test strip, USE TO TEST BLOOD SUGAR TWO TIMES A DAY, Disp: , Rfl:    GVOKE HYPOPEN 2-PACK 1 MG/0.2ML SOAJ, Inject 1 mg into the skin as needed (hypoglycemia)., Disp: 1 mL, Rfl: 0   incobotulinumtoxinA (XEOMIN) 100 units SOLR injection, Inject 100 Units into the muscle every 3 (three) months. , Disp: , Rfl:    Incontinence Supply Disposable (PREVAIL BLADDER CONTROL PAD) MISC, Large incontinence pads to go into underwear, Disp: , Rfl:    insulin glargine (LANTUS) 100 UNIT/ML Solostar Pen, Inject 10 Units into the skin daily., Disp: , Rfl:    Insulin Pen Needle (GLOBAL EASE INJECT PEN NEEDLES) 31G X 5 MM  MISC, Use pen needle to inject insulin daily., Disp: 100 each, Rfl: 0   JARDIANCE 10 MG TABS tablet, Take 1 tablet (10 mg total) by mouth daily before breakfast., Disp: 90 tablet, Rfl: 1   lamoTRIgine (LAMICTAL) 25 MG tablet, Take 1 tablet (25 mg total) by mouth daily., Disp: 90 tablet, Rfl: 0   Lancets (FREESTYLE) lancets, Test daily before all meals/snacks, Disp: , Rfl:    lidocaine (LIDODERM) 5 %, Place 2 patches onto the skin daily., Disp: , Rfl:    LINZESS 145 MCG CAPS capsule, Take 145 mcg by mouth daily before breakfast., Disp: , Rfl:    lisinopril (ZESTRIL) 20 MG tablet, Take 20 mg by mouth daily., Disp: , Rfl:    metFORMIN (GLUCOPHAGE-XR) 500 MG 24 hr tablet, Take 1,000 mg by mouth 2 (two) times daily., Disp: , Rfl:    mirabegron ER (MYRBETRIQ) 25 MG TB24 tablet, Take 2 tablets by mouth daily., Disp: , Rfl:    mirtazapine (REMERON) 15 MG tablet, Take 1.5 tablets (22.5 mg total) by mouth at bedtime., Disp: 135 tablet, Rfl: 1   naloxone (NARCAN) nasal spray 4 mg/0.1 mL, , Disp: , Rfl:    nystatin (MYCOSTATIN/NYSTOP) powder, Apply topically., Disp: , Rfl:    omeprazole (PRILOSEC) 40 MG capsule, Take 40 mg by mouth in the morning and at bedtime., Disp: , Rfl:    ondansetron (ZOFRAN) 4 MG tablet, TAKE 1 TABLET BY MOUTH ONCE DAILY AS NEEDED FOR NAUSEA OR VOMITING, Disp: 30 tablet, Rfl: 1   oxyCODONE (OXY IR/ROXICODONE) 5 MG immediate release tablet, Take by mouth. (Patient not taking: Reported on 04/04/2023), Disp: , Rfl:    oxyCODONE ER 13.5 MG C12A, Take by mouth., Disp: , Rfl:    oxyCODONE-acetaminophen (PERCOCET) 10-325 MG tablet, Take 1 tablet by mouth 3 (three) times daily as needed., Disp: , Rfl:    SUMAtriptan (IMITREX) 100 MG tablet, Take 100 mg by mouth as directed., Disp: , Rfl:    triamcinolone cream (KENALOG) 0.1 %, Apply 1 Application topically 3 (three) times daily., Disp: , Rfl:    Vibegron (GEMTESA) 75 MG TABS, Take 1 tablet (75 mg total) by mouth daily., Disp: 30 tablet, Rfl: 2    Medications ordered in this encounter:  Meds ordered this encounter  Medications   fluconazole (DIFLUCAN) 150 MG tablet    Sig: Take 1 tablet (150 mg total) by mouth every 3 (three) days as needed.    Dispense:  2 tablet    Refill:  0    Order Specific Question:   Supervising Provider    Answer:   Merrilee Jansky [id number]     *If you need refills on other medications prior to your next appointment, please contact your pharmacy*  Follow-Up: Call back or seek an in-person evaluation if the symptoms worsen or  if the condition fails to improve as anticipated.  [facility] [phone]  Other Instructions Vaginal Yeast Infection, Adult  Vaginal yeast infection is a condition that causes vaginal discharge as well as soreness, swelling, and redness (inflammation) of the vagina. This is a common condition. Some women get this infection frequently. What are the causes? This condition is caused by a change in the normal balance of the yeast (Candida) and normal bacteria that live in the vagina. This change causes an overgrowth of yeast, which causes the inflammation. What increases the risk? The condition is more likely to develop in women who: Take antibiotic medicines. Have diabetes. Take birth control pills. Are pregnant. Douche often. Have a weak body defense system (immune system). Have been taking steroid medicines for a long time. Frequently wear tight clothing. What are the signs or symptoms? Symptoms of this condition include: White, thick, creamy vaginal discharge. Swelling, itching, redness, and irritation of the vagina. The lips of the vagina (labia) may be affected as well. Pain or a burning feeling while urinating. Pain during sex. How is this diagnosed? This condition is diagnosed based on: Your medical history. A physical exam. A pelvic exam. Your health care provider will examine a sample of your vaginal discharge under a microscope. Your  health care provider may send this sample for testing to confirm the diagnosis. How is this treated? This condition is treated with medicine. Medicines may be over-the-counter or prescription. You may be told to use one or more of the following: Medicine that is taken by mouth (orally). Medicine that is applied as a cream (topically). Medicine that is inserted directly into the vagina (suppository). Follow these instructions at home: Take or apply over-the-counter and prescription medicines only as told by your health care provider. Do not use tampons until your health care provider approves. Do not have sex until your infection has cleared. Sex can prolong or worsen your symptoms of infection. Ask your health care provider when it is safe to resume sexual activity. Keep all follow-up visits. This is important. How is this prevented?  Do not wear tight clothes, such as pantyhose or tight pants. Wear breathable cotton underwear. Do not use douches, perfumed soap, creams, or powders. Wipe from front to back after using the toilet. If you have diabetes, keep your blood sugar levels under control. Ask your health care provider for other ways to prevent yeast infections. Contact a health care provider if: You have a fever. Your symptoms go away and then return. Your symptoms do not get better with treatment. Your symptoms get worse. You have new symptoms. You develop blisters in or around your vagina. You have blood coming from your vagina and it is not your menstrual period. You develop pain in your abdomen. Summary Vaginal yeast infection is a condition that causes discharge as well as soreness, swelling, and redness (inflammation) of the vagina. This condition is treated with medicine. Medicines may be over-the-counter or prescription. Take or apply over-the-counter and prescription medicines only as told by your health care provider. Do not douche. Resume sexual activity or use of  tampons as instructed by your health care provider. Contact a health care provider if your symptoms do not get better with treatment or your symptoms go away and then return. This information is not intended to replace advice given to you by your health care provider. Make sure you discuss any questions you have with your health care provider. Document Revised: 07/04/2020 Document Reviewed: 07/04/2020  Elsevier Patient Education  2024 Elsevier Inc.    If you have been instructed to have an in-person evaluation today at a local Urgent Care facility, please use the link below. It will take you to a list of all of our available [facility], including address, phone number and hours of operation. Please do not delay care.  [facility]  If you or a family member do not have a primary care provider, use the link below to schedule a visit and establish care. When you choose a [facility] primary care physician or advanced practice provider, you gain a long-term partner in health. Find a Primary Care Provider  Learn more about [facility]'s in-office and virtual care options: [facility] Get Care Now

## 2023-04-09 NOTE — Therapy (Unsigned)
OUTPATIENT PHYSICAL THERAPY TREATMENT   Patient Name: Traci Davis MRN: [id number] DOB:1970-07-08, 52 y.o., female Today's Date: 03/11/2023   PCP: Saralyn Pilar, DO REFERRING PROVIDER: Windle Guard, DO   END OF SESSION:  PT End of Session - 04/09/23 1748     Visit Number 4    Number of Visits 12    Date for PT Re-Evaluation 04/22/23    Authorization Type UHC dual complete; [place] Medicaid    Authorization Time Period 03/11/23-04/22/23    Progress Note Due on Visit 10    PT Start Time 0445    PT Stop Time 0529    PT Time Calculation (min) 44 min    Activity Tolerance Patient tolerated treatment well    Behavior During Therapy [facility] for tasks assessed/performed                 Past Medical History:  Diagnosis Date   Acute pancreatitis 09/04/2014   Anxiety    Arthritis    Asthma    Cerebral palsy (HCC)    Complication of anesthesia    panic attacks before surgery   COPD (chronic obstructive pulmonary disease) (HCC)    Depression    Diabetes mellitus without complication (HCC)    GERD (gastroesophageal reflux disease)    Hypertension    Noninfectious gastroenteritis and colitis 01/10/2013    Past Surgical History:  Procedure Laterality Date   CESAREAN SECTION  1995   CHOLECYSTECTOMY     DILATATION & CURETTAGE/HYSTEROSCOPY WITH MYOSURE N/A 02/20/2018   Procedure: DILATATION & CURETTAGE/HYSTEROSCOPY WITH MYOSURE ENDOMETRIAL POLYPECTOMY;  Surgeon: Conard Novak, MD;  Location: [facility] ORS;  Service: Gynecology;  Laterality: N/A;   ESOPHAGOGASTRODUODENOSCOPY (EGD) WITH PROPOFOL N/A 01/01/2019   Procedure: ESOPHAGOGASTRODUODENOSCOPY (EGD) WITH PROPOFOL;  Surgeon: Toney Reil, MD;  Location: [facility] ENDOSCOPY;  Service: Gastroenterology;  Laterality: N/A;   FOOT CAPSULE RELEASE W/ PERCUTANEOUS HEEL CORD LENGTHENING, TIBIAL TENDON TRANSFER     age 58 ,and 2010-both legs   JOINT REPLACEMENT Right    both knees partial replacements dates unknown   knee  replacment     x 5 per pt. last one- left knee 2014. has had 2 on R 3 on L   TYMPANOPLASTY WITH GRAFT Right 01/08/2018   Procedure: TYMPANOPLASTY WITH POSSIBLE OSSICULAR CHAIN RECONSTRUCTION;  Surgeon: Geanie Logan, MD;  Location: [facility] ORS;  Service: ENT;  Laterality: Right;    Patient Active Problem List   Diagnosis Date Noted   Urge urinary incontinence 04/02/2023   Incontinence of feces 04/02/2023   Vaginal atrophy 04/02/2023   Asthma, persistent controlled 02/06/2023   Continuous leakage of urine 01/04/2023   De Quervain's tenosynovitis, right 10/08/2022   Hot flashes due to menopause 07/17/2022   Convulsions (HCC) 07/09/2022   Obesity due to excess calories 09/22/2020   Insomnia due to medical condition 05/11/2020   Hamstring tendonitis 01/29/2020   MDD (major depressive disorder), recurrent, in full remission (HCC) 08/12/2019   Seizure-like activity (HCC) 07/08/2019   Mild episode of recurrent major depressive disorder (HCC) 12/29/2018   Insomnia due to mental condition 12/29/2018   Disorder of vein 03/04/2018   Lumbar sprain 03/04/2018   Muscle weakness 03/04/2018   Neck sprain 03/04/2018   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   Postmenopausal bleeding 02/18/2018   Impingement syndrome of shoulder region 02/04/2018   Chest pain with moderate risk for cardiac etiology 05/19/2017   GAD (generalized anxiety disorder) 04/15/2017   Chronic daily headache 04/15/2017   Panic attack 04/15/2017  Nocturnal enuresis 04/15/2017   Mild cognitive impairment 04/15/2017   Loss of memory 01/14/2017   Pain medication agreement signed 01/08/2017   Headache disorder 12/04/2016   Chronic, continuous use of opioids 07/01/2015   Vertigo 07/01/2015   Chronic midline low back pain without sciatica 03/21/2015   Type 2 diabetes mellitus with other specified complication (HCC) 09/05/2014   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   Hip pain, chronic 04/28/2013   Essential hypertension  01/10/2013   Irritable colon 01/10/2013   Encounter for long-term (current) use of other medications 12/04/2012   Chronic GERD 08/12/2012   Diarrhea 08/12/2012   Primary localized osteoarthrosis, lower leg 05/07/2012   Difficulty walking 02/06/2012     ONSET DATE: chronic, congential   REFERRING DIAG: spastic diplegic CP; Rt leg pain; Lt leg pain   THERAPY DIAG:  Difficulty in walking, not elsewhere classified  Rationale for Evaluation and Treatment: Rehabilitation    SUBJECTIVE:                                                                                                                                                                                             SUBJECTIVE STATEMENT:  Pt reports feeling pain everyday. Reports "feeling good" today though relative to average pain.    PERTINENT HISTORY:  Traci Davis is a 51yoF who is referred to this clinic after a period of immobility and subsequent weakness upon resumption. Pt had a Rt wrist surgery in September and did not feel safe AMB with wrist splint. Pt reports her legs are much weakness now and she is having trouble mobilizing at her baseline. Pt familiar to this clinic from prior episodes.   PAIN:  Are you having pain? Yes, bilat leg pain, 7/10 bilat anterior thighs progressive with walking, resolves with rest/sitting. Pt reports chronic left lower leg pain which has been responsive to gabapentin for several weeks now.     PRECAUTIONS: Wears Left KAFO, Rt knee OA brace; has been released from Rt wrist splint but wears it when mobilizing due to soreness and fear of hitting it on something.  WEIGHT BEARING RESTRICTIONS: WBAT RUE  FALLS: Has patient fallen in last 6 months? 3 falls in past 6 months    PLOF: mod I AMB at baseline; platform walking in household, sometimes short community distances; power scooter or manual WC for community distance AMB; pt drives  PATIENT GOALS: help return to her baseline in AMB  overground   OBJECTIVE:  Note: Objective measures were completed at Evaluation unless otherwise noted.  LOWER EXTREMITY ROM:    P/ROM  Right Eval Left Eval  Hip flexion  Hip extension    Hip abduction    Hip adduction    Hip internal rotation    Hip external rotation    Knee flexion    Knee extension    Ankle dorsiflexion    Ankle plantarflexion    Ankle inversion    Ankle eversion      LOWER EXTREMITY STRENGTH:    MMT Right Eval Left Eval  Hip flexion    Hip extension    Hip abduction    Hip adduction    Hip internal rotation    Hip external rotation    Knee flexion    Knee extension    Ankle dorsiflexion    Ankle plantarflexion    Ankle inversion    Ankle eversion    (Blank rows = not tested)  BED MOBILITY:  Sleeps in a regular bed, uses a trapeze for repositioning; no adjustment, no bed rails   TRANSFERS: Assistive device utilized: ModI STS form platform walking   RAMP:  Level of Assistance: can typically manage c with WC or with platform walker   CURB:  Level of Assistance: can typically manage c with WC or with platform walker   STAIRS: Level of Assistance: does not AMB stairs at baseline   GAIT: Gait pattern: double platform walker with with Left KAFO, but will occasionally AMB the household without KAFO while in socks.  FUNCTIONAL TESTS:  -3xSTS from chair: hands ad lib, walker ad lib, Left KAFO, Rt knee brace: 19.95sec -183ft AMB: Left AFO, double platform RW; 2 minutes, 5 seconds (0.47m/s)   TODAY'S TREATMENT DATE:   04/08/23 Warm-up: LAQ with red band 1x10 per side, seated hamstring curl with red theraband 1x10 per side 3xSTS - 14.55sec 163ft walk - 1 min 15 seconds  - 5/10 on RPE Pt educated on sit to stand sequencing Sit to stand without hands 3x6  - CGA, min assist to control balance at the end of the last rep  - 7/10 on RPE  Pt educated on proper weight shift to unload stance leg Single leg marches in walker 1x10 on right  with 3# AQ Single leg march in walker 1x8 on left with  Pt educated on how to reset feet when walking if her right foot gets caught under/behind her Pt returned demonstration aforementioned topic with min PT assist   04/03/23 - from session with another PT TherAct: Pt ambulated 2 laps around the gym for goal assessment noted below. TherEx: Seated LAQ, 2x10 each LE Seated hamstring curls with YTB, 2x10 Seated hip adduction into green physioball, 3 sec holds, 2x10 Seated resisted abduction into green physioball, 2x10 STS x10 with brace unlocked   PATIENT EDUCATION: Education details: Patient  Person educated: plan for assessment this date and how to generate goals for improvement Education method: Discussion Education comprehension: good   HOME EXERCISE PROGRAM: Access Code: [email or web address] URL: [email or web address] Date: 03/11/2023 Prepared by: Alvera Novel  Exercises - Seated Hip Abduction with Resistance  - 1 x daily - 7 x weekly - 2 sets - 10 reps - Seated Hip Adduction Isometrics with Ball  - 1 x daily - 7 x weekly - 3 sets - 10 reps - 5 seconds hold - Seated Long Arc Quad with Strap  - 2 x daily - 4 x weekly - 4 sets - 5 reps - 5 seconds hold - Sit to Stand with Counter Support  - 2 x daily - 7 x weekly - 1 sets - 5 reps  GOALS: Goals  reviewed with patient? Yes  SHORT TERM GOALS: Target date: 03/31/23  Pt to perform 3xSTS in <16 sec  Baseline: eval: 19.95sec  12/10: 14.55 sec Goal status: MET  2.  Pt to report regular performance of starter HEP at home and readiness/confidence to progress to additional standing activity.  Baseline: issued at eval, not regularly performing prior.  Goal status: ONGOING  3.  Pt to demonstrate 174ft AMB in clinic in <1 minute 40 seconds.  Baseline: eval: 45m5s 12/10: 58m 15s Goal status: MET   LONG TERM GOALS: Target date: 04/22/23  Pt to perform 5xSTS in <19.95 sec  Baseline: eval: 3xSTS in 19.95sec 12/10: 3xSTS  14.55sec Goal status: ONGOING  2.  Pt to demonstrate tolerance of 298ft AMB without rest break, average speed faster than 0.31m/s Baseline: 0.29 m/s Goal status: ONGOING  3.  Pt to improve score on MobQoL report measure by >20% to indicate improved mobility-specific quality of life.  Baseline: 03/11/23: 0.54 Goal status: ONGOING    ASSESSMENT:  CLINICAL IMPRESSION:  Pt reports to the clinic for treatment of spastic diplegic cerebral palsy. Pt has met short term goals with an improvement in LE strength, mobility and aerobic endurance. During session, pt was able to further show progress towards goals with ability to successfully transfer from sit to stand multiple times without use of hands. Pt was also able to demonstrate understanding of how to improve foot clearance which will help decrease her risk of sustaining a fall. Pt will continue to benefit from skilled therapy to address deficits in order to improve overall QoL and perform ADLs with increased function.     OBJECTIVE IMPAIRMENTS: Abnormal gait, cardiopulmonary status limiting activity, decreased activity tolerance, decreased balance, decreased coordination, decreased endurance, decreased mobility, difficulty walking, decreased ROM, and decreased strength.   ACTIVITY LIMITATIONS: standing, squatting, stairs, transfers, bed mobility, and bathing  PARTICIPATION LIMITATIONS: cleaning, medication management, interpersonal relationship, driving, shopping, and community activity  PERSONAL FACTORS: Age, Behavior pattern, Education, and Past/current experiences are also affecting patient's functional outcome.   REHAB POTENTIAL: Good  CLINICAL DECISION MAKING: Stable/uncomplicated  EVALUATION COMPLEXITY: Moderate  PLAN:  PT FREQUENCY: 1-2x/week  PT DURATION: 6 weeks  PLANNED INTERVENTIONS: 97110-Therapeutic exercises, 97530- Therapeutic activity, [id number]- Neuromuscular re-education, 97535- Self Care, 51884- Manual therapy,  [id number]- Subsequent splinting/medication, 97014- Electrical stimulation (unattended), [id number]- Electrical stimulation (manual), 16606- Traction (mechanical), Patient/Family education, and Balance training  PLAN FOR NEXT SESSION:  Continue sit to stand training for strength/power. Continue knee and hip flexor exercises for foot clearance. High intensity gait training to increase function capacity. Other resistance exercises as needed to aid in functional activities   Arcelia Jew, Student-PT [employer] DPT Class of 2025

## 2023-04-09 NOTE — Telephone Encounter (Signed)
Summary: vaginal burning   Pt called saying she is itching and burning in perineum area.  She thinks it is because of the [facility]  CB@  [phone]

## 2023-04-09 NOTE — Telephone Encounter (Signed)
  Chief Complaint: Burning/itching perineum Symptoms: above Frequency: 5-6 days Pertinent Negatives: Patient denies fever Disposition: [] ED /[] Urgent Care (no appt availability in office) / [] Appointment(In office/virtual)/ [x]  [facility]/ [] Home Care/ [] Refused Recommended Disposition /[] [facility]/ []  Follow-up with PCP Additional Notes: Pt reports that s/s  started 5-6 days ago, No vaginal discharge. Pt was tested for UTI at Urologist 04/02/2023 - urine had sugar in it.  Pt thinks this issue d/t Jardiance. Cannot take appt  in office tomorrow. Pt would like a vv. Scheduled for this afternoon.   Summary: vaginal burning   Pt called saying she is itching and burning in perineum area.  She thinks it is because of the Jardiance  CB@  [phone]     Reason for Disposition  MODERATE-SEVERE itching (i.e., interferes with school, work, or sleep)  Answer Assessment - Initial Assessment Questions 1. SYMPTOM: "What's the main symptom you're concerned about?" (e.g., pain, itching, dryness)     Itching and burning. All the times.  2. LOCATION: "Where is the   located?" (e.g., inside/outside, left/right)     perineum 3. ONSET: "When did the  s/s  start?"     5-6 days ago 4. PAIN: "Is there any pain?" If Yes, ask: "How bad is it?" (Scale: 1-10; mild, moderate, severe)   -  MILD (1-3): Doesn't interfere with normal activities.    -  MODERATE (4-7): Interferes with normal activities (e.g., work or school) or awakens from sleep.     -  SEVERE (8-10): Excruciating pain, unable to do any normal activities.     Yes -  5. ITCHING: "Is there any itching?" If Yes, ask: "How bad is it?" (Scale: 1-10; mild, moderate, severe)     yes 6. CAUSE: "What do you think is causing the discharge?" "Have you had the same problem before? What happened then?"     no  Protocols used: Vaginal Symptoms-A-AH

## 2023-04-09 NOTE — Progress Notes (Signed)
Virtual Visit Consent   Traci Davis, you are scheduled for a virtual visit with a [facility] provider today. Just as with appointments in the office, your consent must be obtained to participate. Your consent will be active for this visit and any virtual visit you may have with one of our providers in the next 365 days. If you have a MyChart account, a copy of this consent can be sent to you electronically.  As this is a virtual visit, video technology does not allow for your provider to perform a traditional examination. This may limit your provider's ability to fully assess your condition. If your provider identifies any concerns that need to be evaluated in person or the need to arrange testing (such as labs, EKG, etc.), we will make arrangements to do so. Although advances in technology are sophisticated, we cannot ensure that it will always work on either your end or our end. If the connection with a video visit is poor, the visit may have to be switched to a telephone visit. With either a video or telephone visit, we are not always able to ensure that we have a secure connection.  By engaging in this virtual visit, you consent to the provision of healthcare and authorize for your insurance to be billed (if applicable) for the services provided during this visit. Depending on your insurance coverage, you may receive a charge related to this service.  I need to obtain your verbal consent now. Are you willing to proceed with your visit today? Traci Davis has provided verbal consent on 04/09/2023 for a virtual visit (video or telephone). Traci Loveless, PA-C  Date: 04/09/2023 6:14 PM  Virtual Visit via Video Note   I, Traci Davis, connected with  Traci Davis  ([id number], Jul 10, 1970) on 04/09/23 at  6:15 PM EST by a video-enabled telemedicine application and verified that I am speaking with the correct person using two identifiers.  Location: Patient: Virtual Visit Location  Patient: Mobile Provider: Virtual Visit Location Provider: Home Office   I discussed the limitations of evaluation and management by telemedicine and the availability of in person appointments. The patient expressed understanding and agreed to proceed.    History of Present Illness: Traci Davis is a 52 y.o. who identifies as a female who was assigned female at birth, and is being seen today for perineal itching and burning. She was started on Jardiance and feels this is causing her symptoms. She was having increased urine frequency as well and saw her Urologist for a urine test. It was negative except for glucosuria. She is having worsening itching and burning in the perineal region and feels she may have a yeast infection from the [facility].    Problems:  Patient Active Problem List   Diagnosis Date Noted   Urge urinary incontinence 04/02/2023   Incontinence of feces 04/02/2023   Vaginal atrophy 04/02/2023   Asthma, persistent controlled 02/06/2023   Continuous leakage of urine 01/04/2023   De Quervain's tenosynovitis, right 10/08/2022   Hot flashes due to menopause 07/17/2022   Convulsions (HCC) 07/09/2022   Obesity due to excess calories 09/22/2020   Insomnia due to medical condition 05/11/2020   Hamstring tendonitis 01/29/2020   MDD (major depressive disorder), recurrent, in full remission (HCC) 08/12/2019   Seizure-like activity (HCC) 07/08/2019   Mild episode of recurrent major depressive disorder (HCC) 12/29/2018   Insomnia due to mental condition 12/29/2018   Disorder of vein 03/04/2018   Lumbar sprain  03/04/2018   Muscle weakness 03/04/2018   Neck sprain 03/04/2018   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   Postmenopausal bleeding 02/18/2018   Impingement syndrome of shoulder region 02/04/2018   Chest pain with moderate risk for cardiac etiology 05/19/2017   GAD (generalized anxiety disorder) 04/15/2017   Chronic daily headache 04/15/2017   Panic attack 04/15/2017   Nocturnal  enuresis 04/15/2017   Mild cognitive impairment 04/15/2017   Loss of memory 01/14/2017   Pain medication agreement signed 01/08/2017   Headache disorder 12/04/2016   Chronic, continuous use of opioids 07/01/2015   Vertigo 07/01/2015   Chronic midline low back pain without sciatica 03/21/2015   Type 2 diabetes mellitus with other specified complication (HCC) 09/05/2014   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   Hip pain, chronic 04/28/2013   Essential hypertension 01/10/2013   Irritable colon 01/10/2013   Encounter for long-term (current) use of other medications 12/04/2012   Chronic GERD 08/12/2012   Diarrhea 08/12/2012   Primary localized osteoarthrosis, lower leg 05/07/2012   Difficulty walking 02/06/2012    Allergies:  Allergies  Allergen Reactions   Meloxicam Other (See Comments)    Damage kidney   Morphine And Codeine Other (See Comments)    hallucinations   Vantin [Cefpodoxime] Nausea And Vomiting   Latex Itching   Baclofen Other (See Comments)    "makes cerebral palsy do adverse reaction on me", tightens muscles    Ciprofloxacin Itching and Nausea And Vomiting   Prednisone Other (See Comments)    Elevated blood glucose   Tape Rash    skin tears.  Paper tape is ok   Tramadol Nausea Only   Medications:  Current Outpatient Medications:    acetaminophen (TYLENOL) 325 MG tablet, Take 2 tablets (650 mg total) by mouth every 6 (six) hours as needed., Disp: 60 tablet, Rfl: 0   albuterol (PROVENTIL HFA;VENTOLIN HFA) 108 (90 BASE) MCG/ACT inhaler, Inhale 2 puffs into the lungs 4 (four) times daily as needed. For shortness of breath and/or wheezing, Disp: , Rfl:    amLODipine (NORVASC) 5 MG tablet, Take 5 mg by mouth., Disp: , Rfl:    atorvastatin (LIPITOR) 20 MG tablet, Take 1 tablet (20 mg total) by mouth daily., Disp: 90 tablet, Rfl: 3   azelastine (ASTELIN) 0.1 % nasal spray, Place into both nostrils., Disp: , Rfl:    Blood Glucose Monitoring Suppl (GLUCOCOM  BLOOD GLUCOSE MONITOR) DEVI, Test daily before all meals/snacks and once before bedtime., Disp: , Rfl:    budesonide-formoterol (SYMBICORT) 80-4.5 MCG/ACT inhaler, Inhale 2 puffs up to 4 times daily as needed for symptoms of wheezing or shortness of breath., Disp: , Rfl:    busPIRone (BUSPAR) 10 MG tablet, Take 1 tablet (10 mg total) by mouth 3 (three) times daily., Disp: 90 tablet, Rfl: 1   Continuous Blood Gluc Receiver (DEXCOM G6 RECEIVER) DEVI, 1 Units by Miscellaneous route continuous., Disp: , Rfl:    Continuous Blood Gluc Transmit (DEXCOM G6 TRANSMITTER) MISC, 1 Units by Miscellaneous route Every three (3) months., Disp: , Rfl:    Continuous Glucose Sensor (DEXCOM G7 SENSOR) MISC, Apply Dexcom G7 sensor every 10 days. Type 2 Diabetes uncontrolled (E11.65), Disp: , Rfl:    CREON 36000 units CPEP capsule, 3 capsules in the morning, at noon, and at bedtime., Disp: , Rfl:    cyclobenzaprine (FLEXERIL) 10 MG tablet, Take 10 mg by mouth 3 (three) times daily., Disp: , Rfl:    diazepam (VALIUM) 5 MG tablet, Take 5 mg by  mouth. PRN with injecitons, Disp: , Rfl:    donepezil (ARICEPT) 10 MG tablet, Take 10 mg by mouth at bedtime., Disp: , Rfl:    estradiol (ESTRACE) 0.1 MG/GM vaginal cream, Place 1 Applicatorful vaginally., Disp: , Rfl:    FLUoxetine (PROZAC) 20 MG capsule, Take 1 capsule (20 mg total) by mouth daily. Take along with 40 mg daily - total of 60 mg daily, Disp: 90 capsule, Rfl: 1   FLUoxetine (PROZAC) 40 MG capsule, Take 1 capsule (40 mg total) by mouth daily. Take along with 20 mg daily., Disp: 90 capsule, Rfl: 1   furosemide (LASIX) 40 MG tablet, Take 40 mg by mouth daily as needed for fluid. Taking twice a week, Disp: , Rfl:    gabapentin (NEURONTIN) 300 MG capsule, Take 600 mg by mouth 2 (two) times daily., Disp: , Rfl:    glucose blood test strip, USE TO TEST BLOOD SUGAR TWO TIMES A DAY, Disp: , Rfl:    GVOKE HYPOPEN 2-PACK 1 MG/0.2ML SOAJ, Inject 1 mg into the skin as needed  (hypoglycemia)., Disp: 1 mL, Rfl: 0   incobotulinumtoxinA (XEOMIN) 100 units SOLR injection, Inject 100 Units into the muscle every 3 (three) months. , Disp: , Rfl:    Incontinence Supply Disposable (PREVAIL BLADDER CONTROL PAD) MISC, Large incontinence pads to go into underwear, Disp: , Rfl:    insulin glargine (LANTUS) 100 UNIT/ML Solostar Pen, Inject 10 Units into the skin daily., Disp: , Rfl:    Insulin Pen Needle (GLOBAL EASE INJECT PEN NEEDLES) 31G X 5 MM MISC, Use pen needle to inject insulin daily., Disp: 100 each, Rfl: 0   JARDIANCE 10 MG TABS tablet, Take 1 tablet (10 mg total) by mouth daily before breakfast., Disp: 90 tablet, Rfl: 1   lamoTRIgine (LAMICTAL) 25 MG tablet, Take 1 tablet (25 mg total) by mouth daily., Disp: 90 tablet, Rfl: 0   Lancets (FREESTYLE) lancets, Test daily before all meals/snacks, Disp: , Rfl:    lidocaine (LIDODERM) 5 %, Place 2 patches onto the skin daily., Disp: , Rfl:    LINZESS 145 MCG CAPS capsule, Take 145 mcg by mouth daily before breakfast., Disp: , Rfl:    lisinopril (ZESTRIL) 20 MG tablet, Take 20 mg by mouth daily., Disp: , Rfl:    metFORMIN (GLUCOPHAGE-XR) 500 MG 24 hr tablet, Take 1,000 mg by mouth 2 (two) times daily., Disp: , Rfl:    mirabegron ER (MYRBETRIQ) 25 MG TB24 tablet, Take 2 tablets by mouth daily., Disp: , Rfl:    mirtazapine (REMERON) 15 MG tablet, Take 1.5 tablets (22.5 mg total) by mouth at bedtime., Disp: 135 tablet, Rfl: 1   naloxone (NARCAN) nasal spray 4 mg/0.1 mL, , Disp: , Rfl:    nystatin (MYCOSTATIN/NYSTOP) powder, Apply topically., Disp: , Rfl:    omeprazole (PRILOSEC) 40 MG capsule, Take 40 mg by mouth in the morning and at bedtime., Disp: , Rfl:    ondansetron (ZOFRAN) 4 MG tablet, TAKE 1 TABLET BY MOUTH ONCE DAILY AS NEEDED FOR NAUSEA OR VOMITING, Disp: 30 tablet, Rfl: 1   oxyCODONE (OXY IR/ROXICODONE) 5 MG immediate release tablet, Take by mouth. (Patient not taking: Reported on 04/04/2023), Disp: , Rfl:    oxyCODONE ER  13.5 MG C12A, Take by mouth., Disp: , Rfl:    oxyCODONE-acetaminophen (PERCOCET) 10-325 MG tablet, Take 1 tablet by mouth 3 (three) times daily as needed., Disp: , Rfl:    SUMAtriptan (IMITREX) 100 MG tablet, Take 100 mg by mouth as directed.,  Disp: , Rfl:    triamcinolone cream (KENALOG) 0.1 %, Apply 1 Application topically 3 (three) times daily., Disp: , Rfl:    Vibegron (GEMTESA) 75 MG TABS, Take 1 tablet (75 mg total) by mouth daily., Disp: 30 tablet, Rfl: 2  Observations/Objective: Patient is well-developed, well-nourished in no acute distress.  Resting comfortably  Head is normocephalic, atraumatic.  No labored breathing.  Speech is clear and coherent with logical content.  Patient is alert and oriented at baseline.    Assessment and Plan: There are no diagnoses linked to this encounter. - Symptoms consistent with yeast vaginitis secondary to recent Jardiance start; Patient desires to STOP Jardiance due to this, reports was started on this for weight loss, however, recent A1c was 8.0 - Fluconazole prescribed - Follow up with PCP for elevated A1c since stopping Jardiance due to yeast infections and urinary symptoms - Limit bubble baths, scented lotions/soaps/detergents - Limit tight fitting clothing - Seek on person evaluation if not improving or if symptoms worsen   Follow Up Instructions: I discussed the assessment and treatment plan with the patient. The patient was provided an opportunity to ask questions and all were answered. The patient agreed with the plan and demonstrated an understanding of the instructions.  A copy of instructions were sent to the patient via MyChart unless otherwise noted below.    The patient was advised to call back or seek an in-person evaluation if the symptoms worsen or if the condition fails to improve as anticipated.    Traci Loveless, PA-C

## 2023-04-10 ENCOUNTER — Ambulatory Visit: Payer: 59 | Admitting: Gastroenterology

## 2023-04-10 ENCOUNTER — Telehealth: Payer: Self-pay | Admitting: Obstetrics

## 2023-04-10 ENCOUNTER — Encounter: Payer: 59 | Admitting: Physical Therapy

## 2023-04-10 DIAGNOSIS — N3941 Urge incontinence: Secondary | ICD-10-CM

## 2023-04-10 MED ORDER — MIRABEGRON ER 50 MG PO TB24: 50.0000 mg | ORAL_TABLET | Freq: Every day | ORAL | 3 refills | Status: DC

## 2023-04-10 NOTE — Telephone Encounter (Signed)
Patient called in to say the 50mg  of Myrbetriq is working well for her and needs to be called into [facility].  Previous RX sent in at visit was Gemtesa  75mg .

## 2023-04-10 NOTE — Telephone Encounter (Signed)
Rx changed to Mirabegron 50mg  sent to [facility]

## 2023-04-15 ENCOUNTER — Ambulatory Visit: Payer: 59 | Admitting: Physical Therapy

## 2023-04-15 DIAGNOSIS — R262 Difficulty in walking, not elsewhere classified: Secondary | ICD-10-CM

## 2023-04-15 DIAGNOSIS — G801 Spastic diplegic cerebral palsy: Secondary | ICD-10-CM

## 2023-04-15 DIAGNOSIS — G8929 Other chronic pain: Secondary | ICD-10-CM

## 2023-04-15 NOTE — Therapy (Signed)
OUTPATIENT PHYSICAL THERAPY TREATMENT   Patient Name: Traci Davis MRN: [id number] DOB:Feb 04, 1971, 52 y.o., female Today's Date: 03/11/2023   PCP: Saralyn Pilar, DO REFERRING PROVIDER: Windle Guard, DO   END OF SESSION:  PT End of Session - 04/15/23 1520     Visit Number 5    Number of Visits 12    Date for PT Re-Evaluation 04/22/23    Authorization Type UHC dual complete; [place] Medicaid    Authorization Time Period 03/11/23-04/22/23    Authorization - Number of Visits 5    Progress Note Due on Visit 10    PT Start Time 1515    PT Stop Time 1600    PT Time Calculation (min) 45 min    Activity Tolerance Patient tolerated treatment well    Behavior During Therapy [facility] for tasks assessed/performed                 Past Medical History:  Diagnosis Date   Acute pancreatitis 09/04/2014   Anxiety    Arthritis    Asthma    Cerebral palsy (HCC)    Complication of anesthesia    panic attacks before surgery   COPD (chronic obstructive pulmonary disease) (HCC)    Depression    Diabetes mellitus without complication (HCC)    GERD (gastroesophageal reflux disease)    Hypertension    Noninfectious gastroenteritis and colitis 01/10/2013    Past Surgical History:  Procedure Laterality Date   CESAREAN SECTION  1995   CHOLECYSTECTOMY     DILATATION & CURETTAGE/HYSTEROSCOPY WITH MYOSURE N/A 02/20/2018   Procedure: DILATATION & CURETTAGE/HYSTEROSCOPY WITH MYOSURE ENDOMETRIAL POLYPECTOMY;  Surgeon: Conard Novak, MD;  Location: [facility] ORS;  Service: Gynecology;  Laterality: N/A;   ESOPHAGOGASTRODUODENOSCOPY (EGD) WITH PROPOFOL N/A 01/01/2019   Procedure: ESOPHAGOGASTRODUODENOSCOPY (EGD) WITH PROPOFOL;  Surgeon: Toney Reil, MD;  Location: [facility] ENDOSCOPY;  Service: Gastroenterology;  Laterality: N/A;   FOOT CAPSULE RELEASE W/ PERCUTANEOUS HEEL CORD LENGTHENING, TIBIAL TENDON TRANSFER     age 52 ,and 2010-both legs   JOINT REPLACEMENT Right    both knees  partial replacements dates unknown   knee replacment     x 5 per pt. last one- left knee 2014. has had 2 on R 3 on L   TYMPANOPLASTY WITH GRAFT Right 01/08/2018   Procedure: TYMPANOPLASTY WITH POSSIBLE OSSICULAR CHAIN RECONSTRUCTION;  Surgeon: Geanie Logan, MD;  Location: [facility] ORS;  Service: ENT;  Laterality: Right;    Patient Active Problem List   Diagnosis Date Noted   Urge urinary incontinence 04/02/2023   Incontinence of feces 04/02/2023   Vaginal atrophy 04/02/2023   Asthma, persistent controlled 02/06/2023   Continuous leakage of urine 01/04/2023   De Quervain's tenosynovitis, right 10/08/2022   Hot flashes due to menopause 07/17/2022   Convulsions (HCC) 07/09/2022   Obesity due to excess calories 09/22/2020   Insomnia due to medical condition 05/11/2020   Hamstring tendonitis 01/29/2020   MDD (major depressive disorder), recurrent, in full remission (HCC) 08/12/2019   Seizure-like activity (HCC) 07/08/2019   Mild episode of recurrent major depressive disorder (HCC) 12/29/2018   Insomnia due to mental condition 12/29/2018   Disorder of vein 03/04/2018   Lumbar sprain 03/04/2018   Muscle weakness 03/04/2018   Neck sprain 03/04/2018   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   Postmenopausal bleeding 02/18/2018   Impingement syndrome of shoulder region 02/04/2018   Chest pain with moderate risk for cardiac etiology 05/19/2017   GAD (generalized anxiety disorder) 04/15/2017   Chronic  daily headache 04/15/2017   Panic attack 04/15/2017   Nocturnal enuresis 04/15/2017   Mild cognitive impairment 04/15/2017   Loss of memory 01/14/2017   Pain medication agreement signed 01/08/2017   Headache disorder 12/04/2016   Chronic, continuous use of opioids 07/01/2015   Vertigo 07/01/2015   Chronic midline low back pain without sciatica 03/21/2015   Type 2 diabetes mellitus with other specified complication (HCC) 09/05/2014   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   Hip pain, chronic  04/28/2013   Essential hypertension 01/10/2013   Irritable colon 01/10/2013   Encounter for long-term (current) use of other medications 12/04/2012   Chronic GERD 08/12/2012   Diarrhea 08/12/2012   Primary localized osteoarthrosis, lower leg 05/07/2012   Difficulty walking 02/06/2012     ONSET DATE: chronic, congential   REFERRING DIAG: spastic diplegic CP; Rt leg pain; Lt leg pain   THERAPY DIAG:  Difficulty in walking, not elsewhere classified  Rationale for Evaluation and Treatment: Rehabilitation    SUBJECTIVE:                                                                                                                                                                                             SUBJECTIVE STATEMENT:  Pt reports feeling increased pain in right hip and low back because she is overloading her right side. She is afraid to put weight on her left side because she feels like it is weak and she will fall.   PERTINENT HISTORY:  Traci Davis is a 52yoF who is referred to this clinic after a period of immobility and subsequent weakness upon resumption. Pt had a Rt wrist surgery in September and did not feel safe AMB with wrist splint. Pt reports her legs are much weakness now and she is having trouble mobilizing at her baseline. Pt familiar to this clinic from prior episodes.   PAIN:  Are you having pain? Yes, bilat leg pain, 7/10 bilat anterior thighs progressive with walking, resolves with rest/sitting. Pt reports chronic left lower leg pain which has been responsive to gabapentin for several weeks now.     PRECAUTIONS: Wears Left KAFO, Rt knee OA brace; has been released from Rt wrist splint but wears it when mobilizing due to soreness and fear of hitting it on something.  WEIGHT BEARING RESTRICTIONS: WBAT RUE  FALLS: Has patient fallen in last 6 months? 3 falls in past 6 months    PLOF: mod I AMB at baseline; platform walking in household, sometimes short  community distances; power scooter or manual WC for community distance AMB; pt drives  PATIENT GOALS: help return to her  baseline in AMB overground   OBJECTIVE:  Note: Objective measures were completed at Evaluation unless otherwise noted.  LOWER EXTREMITY ROM:    P/ROM  Right Eval Left Eval  Hip flexion    Hip extension    Hip abduction    Hip adduction    Hip internal rotation    Hip external rotation    Knee flexion    Knee extension    Ankle dorsiflexion    Ankle plantarflexion    Ankle inversion    Ankle eversion      LOWER EXTREMITY STRENGTH:    MMT Right Eval Left Eval  Hip flexion    Hip extension    Hip abduction    Hip adduction    Hip internal rotation    Hip external rotation    Knee flexion    Knee extension    Ankle dorsiflexion    Ankle plantarflexion    Ankle inversion    Ankle eversion    (Blank rows = not tested)  BED MOBILITY:  Sleeps in a regular bed, uses a trapeze for repositioning; no adjustment, no bed rails   TRANSFERS: Assistive device utilized: ModI STS form platform walking   RAMP:  Level of Assistance: can typically manage c with WC or with platform walker   CURB:  Level of Assistance: can typically manage c with WC or with platform walker   STAIRS: Level of Assistance: does not AMB stairs at baseline   GAIT: Gait pattern: double platform walker with with Left KAFO, but will occasionally AMB the household without KAFO while in socks.  FUNCTIONAL TESTS:  -3xSTS from chair: hands ad lib, walker ad lib, Left KAFO, Rt knee brace: 19.95sec -143ft AMB: Left AFO, double platform RW; 2 minutes, 5 seconds (0.68m/s)   TODAY'S TREATMENT DATE:   04/15/23: GAIT TRAINING  Lateral Weight Shifts from left to right side using BUE support with walker 2 x 10   RLE Step onto 4 inch yoga block with BUE support  1 x 10  RLE Step onto 4 inch plastic platform with BUE support  1 x 10  10 M ambulation with VC for increased left lateral  weight shift x 4   -min  Standing Step Ups on RLE with lateral weight shift on Left and BUE support 1 x 10  Seated Hip Abduction with red band  2 x 10  -Decreased LLE abduction  04/08/23 Warm-up: LAQ with red band 1x10 per side, seated hamstring curl with red theraband 1x10 per side 3xSTS - 14.55sec 18ft walk - 1 min 15 seconds  - 5/10 on RPE Pt educated on sit to stand sequencing Sit to stand without hands 3x6  - CGA, min assist to control balance at the end of the last rep  - 7/10 on RPE  Pt educated on proper weight shift to unload stance leg Single leg marches in walker 1x10 on right with 3# AQ Single leg march in walker 1x8 on left with  Pt educated on how to reset feet when walking if her right foot gets caught under/behind her Pt returned demonstration aforementioned topic with min PT assist   04/03/23 - from session with another PT TherAct: Pt ambulated 2 laps around the gym for goal assessment noted below. TherEx: Seated LAQ, 2x10 each LE Seated hamstring curls with YTB, 2x10 Seated hip adduction into green physioball, 3 sec holds, 2x10 Seated resisted abduction into green physioball, 2x10 STS x10 with brace unlocked   PATIENT EDUCATION: Education  details: Patient  Person educated: plan for assessment this date and how to generate goals for improvement Education method: Discussion Education comprehension: good   HOME EXERCISE PROGRAM: Access Code: [email or web address] URL: [email or web address] Date: 04/15/2023 Prepared by: Ellin Goodie  Exercises - Seated Hip Abduction with Resistance  - 1 x daily - 7 x weekly - 2 sets - 10 reps - Seated Hip Adduction Isometrics with Ball  - 1 x daily - 7 x weekly - 3 sets - 10 reps - 5 seconds hold - Seated Long Arc Quad with Strap  - 2 x daily - 4 x weekly - 4 sets - 5 reps - 5 seconds hold - Sit to Stand with Counter Support  - 2 x daily - 7 x weekly - 1 sets - 5 reps - Standing Forward Step Taps with Counter Support   - 3-4 x weekly - 3 sets - 10 reps  GOALS: Goals reviewed with patient? Yes  SHORT TERM GOALS: Target date: 03/31/23  Pt to perform 3xSTS in <16 sec  Baseline: eval: 19.95sec  12/10: 14.55 sec Goal status: MET  2.  Pt to report regular performance of starter HEP at home and readiness/confidence to progress to additional standing activity.  Baseline: issued at eval, not regularly performing prior.  Goal status: ONGOING  3.  Pt to demonstrate 152ft AMB in clinic in <1 minute 40 seconds.  Baseline: eval: 49m5s 12/10: 90m 15s Goal status: MET   LONG TERM GOALS: Target date: 04/22/23  Pt to perform 5xSTS in <19.95 sec  Baseline: eval: 3xSTS in 19.95sec 12/10: 3xSTS 14.55sec Goal status: ONGOING  2.  Pt to demonstrate tolerance of 250ft AMB without rest break, average speed faster than 0.38m/s Baseline: 0.29 m/s Goal status: ONGOING  3.  Pt to improve score on MobQoL report measure by >20% to indicate improved mobility-specific quality of life.  Baseline: 03/11/23: 0.54 Goal status: ONGOING    ASSESSMENT:  CLINICAL IMPRESSION: Pt presents for follow up treatment for gait deficits and hip and knee pain in the context of spastic diplegic cerebral palsy. She continues to demonstrate decreased bilateral foot clearance with circumduction on LLE due to left knee being locked into extension and right toe drag due to decreased dorsiflexion. Pt able to exhibit improved right toe clearance with min VC and increased time as evidenced by ability to perform step tap. Pt will continue to benefit from skilled therapy to address deficits in order to improve overall QoL and perform ADLs with increased function.     OBJECTIVE IMPAIRMENTS: Abnormal gait, cardiopulmonary status limiting activity, decreased activity tolerance, decreased balance, decreased coordination, decreased endurance, decreased mobility, difficulty walking, decreased ROM, and decreased strength.   ACTIVITY LIMITATIONS:  standing, squatting, stairs, transfers, bed mobility, and bathing  PARTICIPATION LIMITATIONS: cleaning, medication management, interpersonal relationship, driving, shopping, and community activity  PERSONAL FACTORS: Age, Behavior pattern, Education, and Past/current experiences are also affecting patient's functional outcome.   REHAB POTENTIAL: Good  CLINICAL DECISION MAKING: Stable/uncomplicated  EVALUATION COMPLEXITY: Moderate  PLAN:  PT FREQUENCY: 1-2x/week  PT DURATION: 6 weeks  PLANNED INTERVENTIONS: 97110-Therapeutic exercises, 97530- Therapeutic activity, [id number]- Neuromuscular re-education, 97535- Self Care, [id number]- Manual therapy, [id number]- Subsequent splinting/medication, 97014- Electrical stimulation (unattended), [id number]- Electrical stimulation (manual), 19147- Traction (mechanical), Patient/Family education, and Balance training  PLAN FOR NEXT SESSION:  Reassess goals. SLR for increased strengthening of hip flexion. Circuits that include ambulation followed by step taps followed by ambulation followed by sit to stands.    Ellin Goodie PT, DPT  [facility] [address] [place], [address] Phone: [phone]   Fax:  [phone]

## 2023-04-18 ENCOUNTER — Ambulatory Visit: Payer: 59 | Admitting: Physical Therapy

## 2023-04-18 ENCOUNTER — Telehealth: Payer: Self-pay | Admitting: Family Medicine

## 2023-04-18 ENCOUNTER — Encounter: Payer: Self-pay | Admitting: Physical Therapy

## 2023-04-18 DIAGNOSIS — G8929 Other chronic pain: Secondary | ICD-10-CM

## 2023-04-18 DIAGNOSIS — R262 Difficulty in walking, not elsewhere classified: Secondary | ICD-10-CM | POA: Diagnosis not present

## 2023-04-18 NOTE — Therapy (Addendum)
OUTPATIENT PHYSICAL THERAPY TREATMENT   Patient Name: Traci Davis MRN: [id number] DOB:03-13-1971, 52 y.o., female Today's Date: 03/11/2023   PCP: Traci Pilar, DO REFERRING PROVIDER: Windle Guard, DO   END OF SESSION:  PT End of Session - 04/18/23 1608     Visit Number 6    Number of Visits 12    Date for PT Re-Evaluation 04/22/23    Authorization Type UHC dual complete; [place] Medicaid- Medical Necessity    Authorization Time Period 03/11/23-04/22/23    Authorization - Visit Number 6    Authorization - Number of Visits 12    Progress Note Due on Visit 10    PT Start Time 1605    PT Stop Time 1645    PT Time Calculation (min) 40 min    Activity Tolerance Patient tolerated treatment well    Behavior During Therapy [facility] for tasks assessed/performed                 Past Medical History:  Diagnosis Date   Acute pancreatitis 09/04/2014   Anxiety    Arthritis    Asthma    Cerebral palsy (HCC)    Complication of anesthesia    panic attacks before surgery   COPD (chronic obstructive pulmonary disease) (HCC)    Depression    Diabetes mellitus without complication (HCC)    GERD (gastroesophageal reflux disease)    Hypertension    Noninfectious gastroenteritis and colitis 01/10/2013    Past Surgical History:  Procedure Laterality Date   CESAREAN SECTION  1995   CHOLECYSTECTOMY     DILATATION & CURETTAGE/HYSTEROSCOPY WITH MYOSURE N/A 02/20/2018   Procedure: DILATATION & CURETTAGE/HYSTEROSCOPY WITH MYOSURE ENDOMETRIAL POLYPECTOMY;  Surgeon: Traci Novak, MD;  Location: [facility] ORS;  Service: Gynecology;  Laterality: N/A;   ESOPHAGOGASTRODUODENOSCOPY (EGD) WITH PROPOFOL N/A 01/01/2019   Procedure: ESOPHAGOGASTRODUODENOSCOPY (EGD) WITH PROPOFOL;  Surgeon: Traci Reil, MD;  Location: [facility] ENDOSCOPY;  Service: Gastroenterology;  Laterality: N/A;   FOOT CAPSULE RELEASE W/ PERCUTANEOUS HEEL CORD LENGTHENING, TIBIAL TENDON TRANSFER     age 7 ,and  2010-both legs   JOINT REPLACEMENT Right    both knees partial replacements dates unknown   knee replacment     x 5 per pt. last one- left knee 2014. has had 2 on R 3 on L   TYMPANOPLASTY WITH GRAFT Right 01/08/2018   Procedure: TYMPANOPLASTY WITH POSSIBLE OSSICULAR CHAIN RECONSTRUCTION;  Surgeon: Traci Logan, MD;  Location: [facility] ORS;  Service: ENT;  Laterality: Right;    Patient Active Problem List   Diagnosis Date Noted   Urge urinary incontinence 04/02/2023   Incontinence of feces 04/02/2023   Vaginal atrophy 04/02/2023   Asthma, persistent controlled 02/06/2023   Continuous leakage of urine 01/04/2023   De Quervain's tenosynovitis, right 10/08/2022   Hot flashes due to menopause 07/17/2022   Convulsions (HCC) 07/09/2022   Obesity due to excess calories 09/22/2020   Insomnia due to medical condition 05/11/2020   Hamstring tendonitis 01/29/2020   MDD (major depressive disorder), recurrent, in full remission (HCC) 08/12/2019   Seizure-like activity (HCC) 07/08/2019   Mild episode of recurrent major depressive disorder (HCC) 12/29/2018   Insomnia due to mental condition 12/29/2018   Disorder of vein 03/04/2018   Lumbar sprain 03/04/2018   Muscle weakness 03/04/2018   Neck sprain 03/04/2018   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   Postmenopausal bleeding 02/18/2018   Impingement syndrome of shoulder region 02/04/2018   Chest pain with moderate risk for cardiac etiology 05/19/2017  GAD (generalized anxiety disorder) 04/15/2017   Chronic daily headache 04/15/2017   Panic attack 04/15/2017   Nocturnal enuresis 04/15/2017   Mild cognitive impairment 04/15/2017   Loss of memory 01/14/2017   Pain medication agreement signed 01/08/2017   Headache disorder 12/04/2016   Chronic, continuous use of opioids 07/01/2015   Vertigo 07/01/2015   Chronic midline low back pain without sciatica 03/21/2015   Type 2 diabetes mellitus with other specified complication (HCC) 09/05/2014   Spastic  diplegic cerebral palsy (HCC) 01/05/2014   Hip pain, chronic 04/28/2013   Essential hypertension 01/10/2013   Irritable colon 01/10/2013   Encounter for long-term (current) use of other medications 12/04/2012   Chronic GERD 08/12/2012   Diarrhea 08/12/2012   Primary localized osteoarthrosis, lower leg 05/07/2012   Difficulty walking 02/06/2012     ONSET DATE: chronic, congential   REFERRING DIAG: spastic diplegic CP; Rt leg pain; Lt leg pain   THERAPY DIAG:  Difficulty in walking, not elsewhere classified  Rationale for Evaluation and Treatment: Rehabilitation    SUBJECTIVE:                                                                                                                                                                                             SUBJECTIVE STATEMENT:  Pt reports that she feels increased soreness from going to gym yesterday where she worked out with a friend. She describes having difficulty moving her foot onto a piece of machinery and she needed her friends help with it.   PERTINENT HISTORY:  Traci Davis is a 51yoF who is referred to this clinic after a period of immobility and subsequent weakness upon resumption. Pt had a Rt wrist surgery in September and did not feel safe AMB with wrist splint. Pt reports her legs are much weakness now and she is having trouble mobilizing at her baseline. Pt familiar to this clinic from prior episodes.   PAIN:  Are you having pain? Yes, bilat leg pain, 7/10 bilat anterior thighs progressive with walking, resolves with rest/sitting. Pt reports chronic left lower leg pain which has been responsive to gabapentin for several weeks now.     PRECAUTIONS: Wears Left KAFO, Rt knee OA brace; has been released from Rt wrist splint but wears it when mobilizing due to soreness and fear of hitting it on something.  WEIGHT BEARING RESTRICTIONS: WBAT RUE  FALLS: Has patient fallen in last 6 months? 3 falls in past 6 months     PLOF: mod I AMB at baseline; platform walking in household, sometimes short community distances; power scooter or manual WC for community distance AMB; pt  drives  PATIENT GOALS: help return to her baseline in AMB overground   OBJECTIVE:  Note: Objective measures were completed at Evaluation unless otherwise noted.  LOWER EXTREMITY ROM:    P/ROM  Right Eval Left Eval  Hip flexion    Hip extension    Hip abduction    Hip adduction    Hip internal rotation    Hip external rotation    Knee flexion    Knee extension    Ankle dorsiflexion    Ankle plantarflexion    Ankle inversion    Ankle eversion      LOWER EXTREMITY STRENGTH:    MMT Right Eval Left Eval  Hip flexion    Hip extension    Hip abduction    Hip adduction    Hip internal rotation    Hip external rotation    Knee flexion    Knee extension    Ankle dorsiflexion    Ankle plantarflexion    Ankle inversion    Ankle eversion    (Blank rows = not tested)  BED MOBILITY:  Sleeps in a regular bed, uses a trapeze for repositioning; no adjustment, no bed rails   TRANSFERS: Assistive device utilized: ModI STS form platform walking   RAMP:  Level of Assistance: can typically manage c with WC or with platform walker   CURB:  Level of Assistance: can typically manage c with WC or with platform walker   STAIRS: Level of Assistance: does not AMB stairs at baseline   GAIT: Gait pattern: double platform walker with with Left KAFO, but will occasionally AMB the household without KAFO while in socks.  FUNCTIONAL TESTS:  -3xSTS from chair: hands ad lib, walker ad lib, Left KAFO, Rt knee brace: 19.95sec -118ft AMB: Left AFO, double platform RW; 2 minutes, 5 seconds (0.17m/s)   TODAY'S TREATMENT DATE:   04/18/23: Seated Hip Flexion Step Over yoga block  200 Amb: 61 meters in 135 sec= 0.45 m/sec   ModQoL7 Score=71%  5x STS: 22 sec    Straight Leg Raise on LLE 3 x 5  -min VC to tight stomach to  decrease low back arching   Modified HEP to include focus on remaining deficits.    04/15/23: GAIT TRAINING  Lateral Weight Shifts from left to right side using BUE support with walker 2 x 10   RLE Step onto 4 inch yoga block with BUE support  1 x 10  RLE Step onto 4 inch plastic platform with BUE support  1 x 10  10 M ambulation with VC for increased left lateral weight shift x 4   -min  Standing Step Ups on RLE with lateral weight shift on Left and BUE support 1 x 10  Seated Hip Abduction with red band  2 x 10  -Decreased LLE abduction  04/08/23 Warm-up: LAQ with red band 1x10 per side, seated hamstring curl with red theraband 1x10 per side 3xSTS - 14.55sec 164ft walk - 1 min 15 seconds  - 5/10 on RPE Pt educated on sit to stand sequencing Sit to stand without hands 3x6  - CGA, min assist to control balance at the end of the last rep  - 7/10 on RPE  Pt educated on proper weight shift to unload stance leg Single leg marches in walker 1x10 on right with 3# AQ Single leg march in walker 1x8 on left with  Pt educated on how to reset feet when walking if her right foot gets caught under/behind her  Pt returned demonstration aforementioned topic with min PT assist   04/03/23 - from session with another PT TherAct: Pt ambulated 2 laps around the gym for goal assessment noted below. TherEx: Seated LAQ, 2x10 each LE Seated hamstring curls with YTB, 2x10 Seated hip adduction into green physioball, 3 sec holds, 2x10 Seated resisted abduction into green physioball, 2x10 STS x10 with brace unlocked   PATIENT EDUCATION: Education details: Patient  Person educated: plan for assessment this date and how to generate goals for improvement Education method: Discussion Education comprehension: good   HOME EXERCISE PROGRAM: Access Code: [email or web address] URL: [email or web address] Date: 04/18/2023 Prepared by: Ellin Goodie  Exercises - Seated Hip Abduction with Resistance   - 3-4 x weekly - 3 sets - 10 reps - Sit to Stand with Counter Support  - 3-4 x weekly - 2 sets - 10 reps - Standing Forward Step Taps with Counter Support  - 3-4 x weekly - 3 sets - 10 reps - Supine Active Straight Leg Raise  - 3-4 x weekly - 3 sets - 5 reps   GOALS: Goals reviewed with patient? Yes  SHORT TERM GOALS: Target date: 03/31/23  Pt to perform 3xSTS in <16 sec  Baseline: eval: 19.95sec  12/10: 14.55 sec   Goal status: MET  2.  Pt to report regular performance of starter HEP at home and readiness/confidence to progress to additional standing activity.  Baseline: issued at eval, not regularly performing prior.  04/18/23: MET- performing independently  Goal status: MET   3.  Pt to demonstrate 131ft AMB in clinic in <1 minute 40 seconds.  Baseline: eval: 31m5s   12/10: 89m 15s   Goal status: MET   LONG TERM GOALS: Target date: 04/22/23  Pt to perform 5xSTS in <19.95 sec  Baseline: eval: 3xSTS in 19.95sec 12/10: 3xSTS 14.55sec 04/18/23: 5 x STS 22.88 sec  Goal status: Partially Met   2.  Pt to demonstrate tolerance of 29ft AMB without rest break, average speed faster than 0.86m/s Baseline: 0.29 m/s   04/18/23: 0.45 m/sec  Goal status: ACHIEVED   3.  Pt to improve score on MobQoL report measure by >20% to indicate improved mobility-specific quality of life.  Baseline: 03/11/23: 0.54  04/18/23: 0.71 % Goal status: Partially Met     ASSESSMENT:  CLINICAL IMPRESSION: Pt shows an improvement in LE strength and mobility with progression towards goals. She also shows increased perception of quality of life as it relates to mobility. She is close to meeting all her goals, but she still shows deficits that include decreased LE strength. Pt utilizes low back for compensation with straight leg raise. Pt will continue to benefit from skilled therapy to address deficits in order to improve overall QoL and perform ADLs with increased function.    OBJECTIVE IMPAIRMENTS: Abnormal  gait, cardiopulmonary status limiting activity, decreased activity tolerance, decreased balance, decreased coordination, decreased endurance, decreased mobility, difficulty walking, decreased ROM, and decreased strength.   ACTIVITY LIMITATIONS: standing, squatting, stairs, transfers, bed mobility, and bathing  PARTICIPATION LIMITATIONS: cleaning, medication management, interpersonal relationship, driving, shopping, and community activity  PERSONAL FACTORS: Age, Behavior pattern, Education, and Past/current experiences are also affecting patient's functional outcome.   REHAB POTENTIAL: Good  CLINICAL DECISION MAKING: Stable/uncomplicated  EVALUATION COMPLEXITY: Moderate  PLAN:  PT FREQUENCY: 1-2x/week  PT DURATION: 6 weeks  PLANNED INTERVENTIONS: 97110-Therapeutic exercises, 97530- Therapeutic activity, [id number]- Neuromuscular re-education, 97535- Self Care, 02725- Manual therapy, [id number]- Subsequent splinting/medication, 97014- Electrical stimulation (unattended), [id number]- Electrical stimulation (manual), [id number]- Traction (  mechanical), Patient/Family education, and Balance training  PLAN FOR NEXT SESSION:   SLR for increased strengthening of hip flexion. Circuits that include ambulation followed by step taps followed by ambulation followed by sit to stands.    Ellin Goodie PT, DPT  [facility] [address] [place], [address] Phone: [phone]   Fax:  [phone]

## 2023-04-18 NOTE — Telephone Encounter (Signed)
Called 04/18/2023 to sched AWV - MAILBOX FULL  Verlee Rossetti; Care Guide Ambulatory Clinical Support [facility] [facility] Direct Dial: [phone]

## 2023-05-07 ENCOUNTER — Telehealth: Payer: 59 | Admitting: Family Medicine

## 2023-05-07 ENCOUNTER — Encounter: Payer: Self-pay | Admitting: Family Medicine

## 2023-05-07 ENCOUNTER — Ambulatory Visit: Payer: 59 | Admitting: Physical Therapy

## 2023-05-07 DIAGNOSIS — B379 Candidiasis, unspecified: Secondary | ICD-10-CM

## 2023-05-07 DIAGNOSIS — B9789 Other viral agents as the cause of diseases classified elsewhere: Secondary | ICD-10-CM | POA: Diagnosis not present

## 2023-05-07 DIAGNOSIS — J0110 Acute frontal sinusitis, unspecified: Secondary | ICD-10-CM | POA: Diagnosis not present

## 2023-05-07 MED ORDER — FLUCONAZOLE 150 MG PO TABS
ORAL_TABLET | ORAL | 0 refills | Status: DC
Start: 2023-05-07 — End: 2023-07-11

## 2023-05-07 MED ORDER — IPRATROPIUM BROMIDE 0.06 % NA SOLN: 2.0000 | Freq: Four times a day (QID) | NASAL | 0 refills | Status: DC

## 2023-05-07 MED ORDER — AMOXICILLIN-POT CLAVULANATE 875-125 MG PO TABS
1.0000 | ORAL_TABLET | Freq: Two times a day (BID) | ORAL | 0 refills | Status: DC
Start: 2023-05-07 — End: 2023-05-21

## 2023-05-07 NOTE — Patient Instructions (Addendum)

## 2023-05-07 NOTE — Progress Notes (Signed)
 Subjective:    Patient ID: Traci Davis, female    DOB: 01-08-1971, 53 y.o.   MRN: [id number]  Traci Davis is a 53 y.o. female presenting on 05/07/2023 for Sinusitis   Virtual / Telehealth Encounter - Video Visit via MyChart The purpose of this virtual visit is to provide medical care while limiting exposure to the novel coronavirus (COVID19) for both patient and office staff.  Consent was obtained for remote visit:  Yes.   Answered questions that patient had about telehealth interaction:  Yes.   I discussed the limitations, risks, security and privacy concerns of performing an evaluation and management service by video/telephone. I also discussed with the patient that there may be a patient responsible charge related to this service. The patient expressed understanding and agreed to proceed.  Patient Location: Home Provider Location: [facility] (Office)  Participants in virtual visit: - Patient: Traci Davis - CMA: Alan Fontana CMA - Provider: Dr Edman   HPI  Discussed the use of AI scribe software for clinical note transcription with the patient, who gave verbal consent to proceed.  History of Present Illness    Sinusitis The patient, with a history of hypertension and allergies, presented with symptoms suggestive of a sinus infection. Recent onset with symptoms including body aches, runny nose, and an itchy throat. The patient also reported a fever on the first day of symptoms.  The patient had been managing symptoms with Coricidin, Zinc , Vitamin C, Despite these interventions, the patient still felt unwell.  The patient has a history of similar episodes that typically extend to the ears and require antibiotic treatment for resolution. The patient has a known allergy to Cipro  but has previously responded well to Augmentin . However, the patient also requires concurrent anti-yeast medication when on antibiotics.  In addition to the sinus infection  symptoms, the patient reported a persistent cough. She has been using a nasal spray for allergies but was unsure if it would be effective for the current symptoms. The patient has previously used cough pearls, but there were concerns about insurance coverage.          02/27/2023    4:52 PM 12/06/2022    3:34 PM 04/05/2022    4:17 PM  Depression screen PHQ 2/9  Decreased Interest 0    Down, Depressed, Hopeless 0    PHQ - 2 Score 0    Altered sleeping     Tired, decreased energy     Change in appetite     Feeling bad or failure about yourself      Trouble concentrating     Moving slowly or fidgety/restless     Suicidal thoughts     PHQ-9 Score        Information is confidential and restricted. Go to Review Flowsheets to unlock data.       12/06/2022    3:34 PM 04/05/2022    4:18 PM 06/06/2021    4:51 PM  GAD 7 : Generalized Anxiety Score  Nervous, Anxious, on Edge     Control/stop worrying     Worry too much - different things     Trouble relaxing     Restless     Easily annoyed or irritable     Afraid - awful might happen     Total GAD 7 Score     Anxiety Difficulty        Information is confidential and restricted. Go to Review Flowsheets to unlock data.  Social History   Tobacco Use   Smoking status: Never   Smokeless tobacco: Never  Vaping Use   Vaping status: Never Used  Substance Use Topics   Alcohol use: No   Drug use: No    Review of Systems Per HPI unless specifically indicated above     Objective:    LMP 08/20/2014 Comment: missed cup when trying to obtain urine spec  Wt Readings from Last 3 Encounters:  04/02/23 196 lb 6.4 oz (89.1 kg)  02/27/23 204 lb (92.5 kg)  02/06/23 200 lb (90.7 kg)     Physical Exam  Note examination was completely remotely via video observation objective data only  Gen - well-appearing, no acute distress or apparent pain, comfortable HEENT - eyes appear clear without discharge or redness Heart/Lungs - cannot  examine virtually - observed evidence of coughing Abd - cannot examine virtually  Skin - face visible today- no rash Neuro - awake, alert, oriented Psych - not anxious appearing   Results for orders placed or performed in visit on 04/02/23  POCT Urinalysis Dipstick   Collection Time: 04/02/23  2:29 PM  Result Value Ref Range   Color, UA yellow    Clarity, UA clear    Glucose, UA Positive (A) Negative   Bilirubin, UA negative    Ketones, UA negative    Spec Grav, UA <=1.005 (A) 1.010 - 1.025   Blood, UA negative    pH, UA 6.5 5.0 - 8.0   Protein, UA Negative Negative   Urobilinogen, UA 0.2 0.2 or 1.0 E.U./dL   Nitrite, UA negative    Leukocytes, UA Negative Negative   Appearance     Odor        Assessment & Plan:   Problem List Items Addressed This Visit   None Visit Diagnoses       Acute non-recurrent frontal sinusitis    -  Primary   Relevant Medications   amoxicillin -clavulanate (AUGMENTIN ) 875-125 MG tablet   fluconazole  (DIFLUCAN ) 150 MG tablet   ipratropium (ATROVENT ) 0.06 % nasal spray     Antibiotic-induced yeast infection       Relevant Medications   fluconazole  (DIFLUCAN ) 150 MG tablet        Sinus Infection Recent onset of body aches, runny nose, and cough. No fever today. Patient has a history of sinus infections progressing to ear infections and typically requires antibiotics for resolution.  -Prescribe Augmentin . -Prescribe anti-yeast medication concurrently due to patient's history of yeast infections with antibiotic use. -Consider over-the-counter Coricidin for cough as patient's insurance may not cover prescribed cough pearls. -Prescribe short-term nasal spray for additional symptom relief.    No orders of the defined types were placed in this encounter.   Meds ordered this encounter  Medications   amoxicillin -clavulanate (AUGMENTIN ) 875-125 MG tablet    Sig: Take 1 tablet by mouth 2 (two) times daily.    Dispense:  20 tablet    Refill:   0   fluconazole  (DIFLUCAN ) 150 MG tablet    Sig: Take one tablet by mouth on Day 1. Repeat dose 2nd tablet on Day 3.    Dispense:  2 tablet    Refill:  0   ipratropium (ATROVENT ) 0.06 % nasal spray    Sig: Place 2 sprays into both nostrils 4 (four) times daily. For up to 5-7 days then stop.    Dispense:  15 mL    Refill:  0    Follow up plan: Return if symptoms worsen or  fail to improve.   Patient verbalizes understanding with the above medical recommendations including the limitation of remote medical advice.  Specific follow-up and call-back criteria were given for patient to follow-up or seek medical care more urgently if needed.  Total duration of direct patient care provided via video conference: 5 minutes   Marsa Officer, DO [facility] 05/07/2023, 11:30 AM

## 2023-05-09 ENCOUNTER — Ambulatory Visit: Payer: 59 | Admitting: Physical Therapy

## 2023-05-10 ENCOUNTER — Encounter: Payer: Self-pay | Admitting: Family Medicine

## 2023-05-10 DIAGNOSIS — R42 Dizziness and giddiness: Secondary | ICD-10-CM | POA: Diagnosis not present

## 2023-05-10 DIAGNOSIS — R0789 Other chest pain: Secondary | ICD-10-CM | POA: Diagnosis not present

## 2023-05-10 DIAGNOSIS — R079 Chest pain, unspecified: Secondary | ICD-10-CM | POA: Diagnosis not present

## 2023-05-13 ENCOUNTER — Ambulatory Visit: Payer: 59 | Admitting: Physical Therapy

## 2023-05-13 ENCOUNTER — Ambulatory Visit: Payer: 59 | Admitting: Family Medicine

## 2023-05-13 ENCOUNTER — Ambulatory Visit: Payer: Self-pay

## 2023-05-13 ENCOUNTER — Encounter: Payer: 59 | Admitting: Physical Therapy

## 2023-05-13 ENCOUNTER — Encounter: Payer: Self-pay | Admitting: Internal Medicine

## 2023-05-13 ENCOUNTER — Ambulatory Visit (INDEPENDENT_AMBULATORY_CARE_PROVIDER_SITE_OTHER): Payer: 59 | Admitting: Internal Medicine

## 2023-05-13 VITALS — BP 138/80 | HR 103 | Temp 98.3°F | Ht 62.0 in | Wt 198.0 lb

## 2023-05-13 DIAGNOSIS — R509 Fever, unspecified: Secondary | ICD-10-CM

## 2023-05-13 LAB — POC COVID19/FLU A&B COMBO
Covid Antigen, POC: NEGATIVE
Influenza A Antigen, POC: NEGATIVE
Influenza B Antigen, POC: NEGATIVE

## 2023-05-13 MED ORDER — AZITHROMYCIN 250 MG PO TABS
ORAL_TABLET | ORAL | 0 refills | Status: DC
Start: 2023-05-13 — End: 2023-05-21

## 2023-05-13 NOTE — Telephone Encounter (Signed)
 Chief Complaint: fever, chills, sweating at night Symptoms: diarrhea new symptom Frequency: onset 05/04/23 of all symptoms, diarrhea onset today Pertinent Negatives: Patient denies other symptoms Disposition: [] ED /[] Urgent Care (no appt availability in office) / [x] Appointment(In office/virtual)/ []  [place] [facility]/ [] Home Care/ [] Refused Recommended Disposition /[] [place] [facility]/ []  Follow-up with PCP Additional Notes: Patient was seen virtually by Dr. Edman on 05/06/22 and says she's feeling worse. Temp last night 100.5, taking antibiotic as prescribed, new diarrhea today and unable to keep anything on stomach. She asked for an appointment today with anyone in the office. Scheduled with Angeline, advised to wear a mask and if boyfriend comes for him to wear a mask, she verbalized understanding.    Summary: Feeling worse following appt   Pt had appt for today, but cancelled when this message below was sent.  Now wants her appt back. Pt has been sick over a week, fever on and off.  The abx given last week not worked Pls advise  ----- Message from Dotsero E sent at 05/13/2023 10:04 AM EST ----- Pt called reporting that she has had a fever, chills, and sweating all night. Says she feels worse than she did during her recent visit. Seeking assistance  Best contact: [phone]     Reason for Disposition  [1] Recent medical visit within 24 hours AND [2] condition / symptoms WORSE  Answer Assessment - Initial Assessment Questions 1. MAIN CONCERN OR SYMPTOM:  What is your main concern right now? What question do you have? What's the main symptom you're worried about? (e.g., breathing difficulty, cough, fever. pain)     Fever, chills, sweating all night 2. ONSET: When did the symptoms  start?     05/04/23 3. BETTER-SAME-WORSE: Are you getting better, staying the same, or getting worse compared to how you felt at your last visit to the doctor (most recent medical  visit)?     Worse 4. VISIT DATE: When were you seen? (Date)     05/07/23 5. VISIT DOCTOR: What is the name of the doctor taking care of you now?     Karamalegos 6. VISIT DIAGNOSIS:  What was the main symptom or problem that you were seen for? Were you given a diagnosis?      Same symptoms 7. VISIT MEDICINES: Did the doctor order any new medicines for you to use? If Yes, ask: Have you filled the prescription and started taking the medicine?      Augmentin  8. PAIN: Is there any pain? If Yes, ask: How bad is it?  (Scale 0-10; or mild, moderate, severe)    - NONE (0): no pain    - MILD (1-3): doesn't interfere with normal activities     - MODERATE (4-7): interferes with normal activities or awakens from sleep     - SEVERE (8-10): excruciating pain, unable to do any normal activities     Chronic pain all the time 10. FEVER: Do you have a fever? If Yes, ask: What is it, how was it measured  and when did it start?       100.5 last night 11. OTHER SYMPTOMS: Do you have any other symptoms?       Diarrhea,  Protocols used: Recent Medical Visit for Illness Follow-up Call-A-AH

## 2023-05-13 NOTE — Progress Notes (Signed)
 Subjective:    Patient ID: Traci Davis, female    DOB: 1970/05/09, 53 y.o.   MRN: [id number]  HPI  Discussed the use of AI scribe software for clinical note transcription with the patient, who gave verbal consent to proceed.   The patient, with a history of diabetes and cerebral palsy, has been experiencing symptoms for almost two weeks. She initially presented with chills, a runny nose, and a cough, which has been managed with cough drops. The patient denies any colored nasal discharge or headache, but reports sinus pressure. She also reports left-sided ear discomfort, despite a history of inner ear problems and previous surgical intervention. The patient denies any sore throat.  The cough is minimally productive, and the patient reports mild shortness of breath. She has been experiencing nausea and diarrhea, managed with Imodium AD. The patient also reports episodes of chills and body aches, describing an inability to get warm.  The patient was previously diagnosed with a sinus infection and has been on a course of Augmentin  for five days, along with Atrovent  nasal spray. Despite this treatment, the patient reports no improvement in symptoms. She also reports difficulty keeping down the medication due to nausea.  The patient's daughter was reportedly ill prior to the onset of her symptoms. The patient has been adhering to the prescribed treatment regimen, but reports waking up feeling worse.       Review of Systems   Past Medical History:  Diagnosis Date   Acute pancreatitis 09/04/2014   Anxiety    Arthritis    Asthma    Cerebral palsy (HCC)    Complication of anesthesia    panic attacks before surgery   COPD (chronic obstructive pulmonary disease) (HCC)    Depression    Diabetes mellitus without complication (HCC)    GERD (gastroesophageal reflux disease)    Hypertension    Noninfectious gastroenteritis and colitis 01/10/2013    Current Outpatient Medications   Medication Sig Dispense Refill   acetaminophen  (TYLENOL ) 325 MG tablet Take 2 tablets (650 mg total) by mouth every 6 (six) hours as needed. 60 tablet 0   albuterol  (PROVENTIL  HFA;VENTOLIN  HFA) 108 (90 BASE) MCG/ACT inhaler Inhale 2 puffs into the lungs 4 (four) times daily as needed. For shortness of breath and/or wheezing     amLODipine  (NORVASC ) 5 MG tablet Take 5 mg by mouth.     amoxicillin -clavulanate (AUGMENTIN ) 875-125 MG tablet Take 1 tablet by mouth 2 (two) times daily. 20 tablet 0   atorvastatin  (LIPITOR) 20 MG tablet Take 1 tablet (20 mg total) by mouth daily. 90 tablet 3   azelastine (ASTELIN) 0.1 % nasal spray Place into both nostrils.     Blood Glucose Monitoring Suppl (GLUCOCOM BLOOD GLUCOSE MONITOR) DEVI Test daily before all meals/snacks and once before bedtime.     budesonide-formoterol (SYMBICORT) 80-4.5 MCG/ACT inhaler Inhale 2 puffs up to 4 times daily as needed for symptoms of wheezing or shortness of breath.     busPIRone  (BUSPAR ) 10 MG tablet Take 1 tablet (10 mg total) by mouth 3 (three) times daily. 90 tablet 1   Continuous Blood Gluc Receiver (DEXCOM G6 RECEIVER) DEVI 1 Units by Miscellaneous route continuous.     Continuous Blood Gluc Transmit (DEXCOM G6 TRANSMITTER) MISC 1 Units by Miscellaneous route Every three (3) months.     Continuous Glucose Sensor (DEXCOM G7 SENSOR) MISC Apply Dexcom G7 sensor every 10 days. Type 2 Diabetes uncontrolled (E11.65)     CREON  36000 units CPEP  capsule 3 capsules in the morning, at noon, and at bedtime.     cyclobenzaprine (FLEXERIL) 10 MG tablet Take 10 mg by mouth 3 (three) times daily.     diazepam  (VALIUM ) 5 MG tablet Take 5 mg by mouth. PRN with injecitons     donepezil (ARICEPT) 10 MG tablet Take 10 mg by mouth at bedtime.     estradiol (ESTRACE) 0.1 MG/GM vaginal cream Place 1 Applicatorful vaginally.     fluconazole  (DIFLUCAN ) 150 MG tablet Take one tablet by mouth on Day 1. Repeat dose 2nd tablet on Day 3. 2 tablet 0    FLUoxetine  (PROZAC ) 20 MG capsule Take 1 capsule (20 mg total) by mouth daily. Take along with 40 mg daily - total of 60 mg daily 90 capsule 1   FLUoxetine  (PROZAC ) 40 MG capsule Take 1 capsule (40 mg total) by mouth daily. Take along with 20 mg daily. 90 capsule 1   furosemide  (LASIX ) 40 MG tablet Take 40 mg by mouth daily as needed for fluid. Taking twice a week     gabapentin (NEURONTIN) 300 MG capsule Take 600 mg by mouth 2 (two) times daily.     glucose blood test strip USE TO TEST BLOOD SUGAR TWO TIMES A DAY     GVOKE HYPOPEN  2-PACK 1 MG/0.2ML SOAJ Inject 1 mg into the skin as needed (hypoglycemia). 1 mL 0   incobotulinumtoxinA (XEOMIN) 100 units SOLR injection Inject 100 Units into the muscle every 3 (three) months.      Incontinence Supply Disposable (PREVAIL BLADDER CONTROL PAD) MISC Large incontinence pads to go into underwear     insulin  glargine (LANTUS ) 100 UNIT/ML Solostar Pen Inject 10 Units into the skin daily.     Insulin  Pen Needle (GLOBAL EASE INJECT PEN NEEDLES) 31G X 5 MM MISC Use pen needle to inject insulin  daily. 100 each 0   ipratropium (ATROVENT ) 0.06 % nasal spray Place 2 sprays into both nostrils 4 (four) times daily. For up to 5-7 days then stop. 15 mL 0   JARDIANCE  10 MG TABS tablet Take 1 tablet (10 mg total) by mouth daily before breakfast. 90 tablet 1   lamoTRIgine  (LAMICTAL ) 25 MG tablet Take 1 tablet (25 mg total) by mouth daily. 90 tablet 0   Lancets (FREESTYLE) lancets Test daily before all meals/snacks     lidocaine  (LIDODERM ) 5 % Place 2 patches onto the skin daily.     LINZESS 145 MCG CAPS capsule Take 145 mcg by mouth daily before breakfast.     lisinopril  (ZESTRIL ) 20 MG tablet Take 20 mg by mouth daily.     metFORMIN  (GLUCOPHAGE -XR) 500 MG 24 hr tablet Take 1,000 mg by mouth 2 (two) times daily.     mirabegron  ER (MYRBETRIQ ) 50 MG TB24 tablet Take 1 tablet (50 mg total) by mouth daily. 30 tablet 3   mirtazapine  (REMERON ) 15 MG tablet Take 1.5 tablets  (22.5 mg total) by mouth at bedtime. 135 tablet 1   naloxone (NARCAN) nasal spray 4 mg/0.1 mL      nystatin  (MYCOSTATIN /NYSTOP ) powder Apply topically.     omeprazole  (PRILOSEC) 40 MG capsule Take 40 mg by mouth in the morning and at bedtime.     ondansetron  (ZOFRAN ) 4 MG tablet TAKE 1 TABLET BY MOUTH ONCE DAILY AS NEEDED FOR NAUSEA OR VOMITING 30 tablet 1   oxyCODONE  (OXY IR/ROXICODONE ) 5 MG immediate release tablet Take by mouth. (Patient not taking: Reported on 04/04/2023)     oxyCODONE  ER 13.5  MG C12A Take by mouth.     oxyCODONE -acetaminophen  (PERCOCET) 10-325 MG tablet Take 1 tablet by mouth 3 (three) times daily as needed.     SUMAtriptan (IMITREX) 100 MG tablet Take 100 mg by mouth as directed.     triamcinolone cream (KENALOG) 0.1 % Apply 1 Application topically 3 (three) times daily.     No current facility-administered medications for this visit.    Allergies  Allergen Reactions   Meloxicam Other (See Comments)    Damage kidney   Morphine And Codeine Other (See Comments)    hallucinations   Vantin [Cefpodoxime] Nausea And Vomiting   Latex Itching   Baclofen Other (See Comments)    makes cerebral palsy do adverse reaction on me, tightens muscles    Ciprofloxacin  Itching and Nausea And Vomiting   Prednisone Other (See Comments)    Elevated blood glucose   Tape Rash    skin tears.  Paper tape is ok   Tramadol Nausea Only    Family History  Problem Relation Age of Onset   CAD Father    Heart attack Brother    Sexual abuse Paternal Grandfather    Breast cancer Neg Hx     Social History   Socioeconomic History   Marital status: Single    Spouse name: Not on file   Number of children: 1   Years of education: Not on file   Highest education level: Associate degree: occupational, scientist, product/process development, or vocational program  Occupational History   Not on file  Tobacco Use   Smoking status: Never   Smokeless tobacco: Never  Vaping Use   Vaping status: Never Used   Substance and Sexual Activity   Alcohol use: No   Drug use: No   Sexual activity: Yes    Partners: Male    Birth control/protection: Condom  Other Topics Concern   Not on file  Social History Narrative   Not on file   Social Drivers of Health   Financial Resource Strain: Medium Risk (05/09/2023)   Overall Financial Resource Strain (CARDIA)    Difficulty of Paying Living Expenses: Somewhat hard  Food Insecurity: Food Insecurity Present (05/09/2023)   Hunger Vital Sign    Worried About Running Out of Food in the Last Year: Sometimes true    Ran Out of Food in the Last Year: Sometimes true  Transportation Needs: No Transportation Needs (05/09/2023)   PRAPARE - Administrator, Civil Service (Medical): No    Lack of Transportation (Non-Medical): No  Physical Activity: Insufficiently Active (05/09/2023)   Exercise Vital Sign    Days of Exercise per Week: 3 days    Minutes of Exercise per Session: 30 min  Stress: No Stress Concern Present (05/09/2023)   [employer] of Occupational Health - Occupational Stress Questionnaire    Feeling of Stress : Only a little  Social Connections: Moderately Isolated (05/09/2023)   Social Connection and Isolation Panel [NHANES]    Frequency of Communication with Friends and Family: Three times a week    Frequency of Social Gatherings with Friends and Family: Twice a week    Attends Religious Services: More than 4 times per year    Active Member of [employer] or Organizations: No    Attends Engineer, Structural: Not on file    Marital Status: Never married  Intimate Partner Violence: Not on file     Constitutional: Pt reports fever and chills. Denies malaise, fatigue, headache or abrupt weight changes.  HEENT:  Pt reports runny nose, ear pain, nasal congestion. Denies eye pain, eye redness, ringing in the ears, wax buildup, runny nose, bloody nose, or sore throat. Respiratory: Pt reports cough, shortness of breath. Denies difficulty  breathing, or sputum production.   Cardiovascular: Denies chest pain, chest tightness, palpitations or swelling in the hands or feet.  Gastrointestinal: Pt reports nausea and diarrhea. Denies abdominal pain, bloating, constipation, diarrhea or blood in the stool.  Musculoskeletal: Pt reports body aches. Denies decrease in range of motion, difficulty with gait or joint swelling.  Skin: Denies redness, rashes, lesions or ulcercations.  Neurological: Denies dizziness, difficulty with memory, difficulty with speech or problems with balance and coordination.    No other specific complaints in a complete review of systems (except as listed in HPI above).      Objective:   Physical Exam  BP (!) 142/86 (BP Location: Right Arm, Patient Position: Sitting, Cuff Size: Large)   Pulse (!) 103   Temp 98.3 F (36.8 C)   Ht 5' 2 (1.575 m)   Wt 198 lb (89.8 kg)   LMP 08/20/2014 Comment: missed cup when trying to obtain urine spec  SpO2 93%   BMI 36.21 kg/m   Wt Readings from Last 3 Encounters:  04/02/23 196 lb 6.4 oz (89.1 kg)  02/27/23 204 lb (92.5 kg)  02/06/23 200 lb (90.7 kg)    General: Appears her stated age, obese, in NAD. Skin: Warm, dry and intact. No rashes noted. HEENT: Head: normal shape and size, mild maxillary sinus pressure noted; Eyes: sclera white, no icterus, conjunctiva pink, PERRLA and EOMs intact; Ears: Tm's gray and intact, normal light reflex; Nose: mucosa pink and moist, septum midline; Throat/Mouth: Teeth present, mucosa pink and moist, + PND, no exudate, lesions or ulcerations noted.  Neck:  No adenopathy noted. Cardiovascular: Tachycardic with normal rhythm. S1,S2 noted.  No murmur, rubs or gallops noted. No JVD or BLE edema. No carotid bruits noted. Pulmonary/Chest: Normal effort and positive vesicular breath sounds. No respiratory distress. No wheezes, rales or ronchi noted.  Neurological: Alert and oriented.   BMET    Component Value Date/Time   NA 135  01/17/2023 1457   NA 134 (L) 04/29/2014 1827   K 3.9 01/17/2023 1457   K 3.8 04/29/2014 1827   CL 99 01/17/2023 1457   CL 103 04/29/2014 1827   CO2 25 01/17/2023 1457   CO2 26 04/29/2014 1827   GLUCOSE 165 (H) 01/17/2023 1457   GLUCOSE 156 (H) 04/29/2014 1827   BUN 18 01/17/2023 1457   BUN 8 04/29/2014 1827   CREATININE 0.85 01/17/2023 1457   CREATININE 1.03 04/29/2014 1827   CALCIUM  8.9 01/17/2023 1457   CALCIUM  8.5 04/29/2014 1827   GFRNONAA >60 01/17/2023 1457   GFRNONAA >60 04/29/2014 1827   GFRNONAA 58 (L) 01/14/2014 0343   GFRAA >60 10/05/2018 1852   GFRAA >60 04/29/2014 1827   GFRAA >60 01/14/2014 0343    Lipid Panel  No results found for: CHOL, TRIG, HDL, CHOLHDL, VLDL, LDLCALC  CBC    Component Value Date/Time   WBC 11.6 (H) 01/17/2023 1457   RBC 4.82 01/17/2023 1457   HGB 13.2 01/17/2023 1457   HGB 11.0 (L) 04/29/2014 1827   HCT 40.7 01/17/2023 1457   HCT 35.9 04/29/2014 1827   PLT 333 01/17/2023 1457   PLT 327 04/29/2014 1827   MCV 84.4 01/17/2023 1457   MCV 72 (L) 04/29/2014 1827   MCH 27.4 01/17/2023 1457   MCHC 32.4 01/17/2023  1457   RDW 14.5 01/17/2023 1457   RDW 21.7 (H) 04/29/2014 1827   LYMPHSABS 2.7 10/05/2018 1852   LYMPHSABS 3.1 04/29/2014 1827   MONOABS 0.4 10/05/2018 1852   MONOABS 0.8 04/29/2014 1827   EOSABS 0.3 10/05/2018 1852   EOSABS 0.4 04/29/2014 1827   BASOSABS 0.1 10/05/2018 1852   BASOSABS 0.2 (H) 04/29/2014 1827    Hgb A1C Lab Results  Component Value Date   HGBA1C 8 01/22/2023            Assessment & Plan:   Assessment and Plan    Acute bacterial sinusitis Persistent symptoms of chills, runny nose, cough, and sinus pressure for almost two weeks. No improvement with Augmentin , Atrovent  nasal spray, and cough suppressants. Negative for COVID and flu. Possible side effects from Augmentin  including nausea and diarrhea. -Discontinue Augmentin . -Start Azithromycin  (Z-Pak) for 5 days. -She declines RX  for Pred taper at this time due concern for elevated sugars    Followup with your PCP as previously scheduled Angeline Laura, NP

## 2023-05-13 NOTE — Patient Instructions (Signed)

## 2023-05-15 ENCOUNTER — Ambulatory Visit: Payer: 59 | Admitting: Physical Therapy

## 2023-05-15 ENCOUNTER — Encounter: Payer: 59 | Admitting: Physical Therapy

## 2023-05-20 ENCOUNTER — Ambulatory Visit: Payer: 59 | Attending: Physical Medicine and Rehabilitation | Admitting: Physical Therapy

## 2023-05-20 DIAGNOSIS — G8929 Other chronic pain: Secondary | ICD-10-CM | POA: Insufficient documentation

## 2023-05-20 DIAGNOSIS — R262 Difficulty in walking, not elsewhere classified: Secondary | ICD-10-CM | POA: Insufficient documentation

## 2023-05-20 DIAGNOSIS — M25561 Pain in right knee: Secondary | ICD-10-CM | POA: Insufficient documentation

## 2023-05-20 DIAGNOSIS — G801 Spastic diplegic cerebral palsy: Secondary | ICD-10-CM | POA: Diagnosis not present

## 2023-05-20 NOTE — Addendum Note (Signed)
Addended by: Johnn Hai on: 05/20/2023 03:37 PM   Modules accepted: Orders

## 2023-05-20 NOTE — Therapy (Addendum)
OUTPATIENT PHYSICAL THERAPY TREATMENT   Patient Name: Traci Davis MRN: [id number] DOB:1970-06-05, 53 y.o., female Today's Date: 03/11/2023   PCP: Saralyn Pilar, DO REFERRING PROVIDER: Windle Guard, DO   END OF SESSION:  PT End of Session - 05/20/23 1528     Visit Number 7    Number of Visits 12    Date for PT Re-Evaluation 04/22/23    Authorization Type UHC dual complete; [place] [employer]- Medical Necessity    Authorization Time Period 04/23/23-06/24/23    Authorization - Visit Number 7    Authorization - Number of Visits 12    Progress Note Due on Visit 10    PT Start Time 1520    PT Stop Time 1600    PT Time Calculation (min) 40 min    Activity Tolerance Patient tolerated treatment well    Behavior During Therapy [facility] for tasks assessed/performed                 Past Medical History:  Diagnosis Date   Acute pancreatitis 09/04/2014   Anxiety    Arthritis    Asthma    Cerebral palsy (HCC)    Complication of anesthesia    panic attacks before surgery   COPD (chronic obstructive pulmonary disease) (HCC)    Depression    Diabetes mellitus without complication (HCC)    GERD (gastroesophageal reflux disease)    Hypertension    Noninfectious gastroenteritis and colitis 01/10/2013    Past Surgical History:  Procedure Laterality Date   CESAREAN SECTION  1995   CHOLECYSTECTOMY     DILATATION & CURETTAGE/HYSTEROSCOPY WITH MYOSURE N/A 02/20/2018   Procedure: DILATATION & CURETTAGE/HYSTEROSCOPY WITH MYOSURE ENDOMETRIAL POLYPECTOMY;  Surgeon: Conard Novak, MD;  Location: [facility] ORS;  Service: Gynecology;  Laterality: N/A;   ESOPHAGOGASTRODUODENOSCOPY (EGD) WITH PROPOFOL N/A 01/01/2019   Procedure: ESOPHAGOGASTRODUODENOSCOPY (EGD) WITH PROPOFOL;  Surgeon: Toney Reil, MD;  Location: [facility] ENDOSCOPY;  Service: Gastroenterology;  Laterality: N/A;   FOOT CAPSULE RELEASE W/ PERCUTANEOUS HEEL CORD LENGTHENING, TIBIAL TENDON TRANSFER     age 21 ,and  2010-both legs   JOINT REPLACEMENT Right    both knees partial replacements dates unknown   knee replacment     x 5 per pt. last one- left knee 2014. has had 2 on R 3 on L   TYMPANOPLASTY WITH GRAFT Right 01/08/2018   Procedure: TYMPANOPLASTY WITH POSSIBLE OSSICULAR CHAIN RECONSTRUCTION;  Surgeon: Geanie Logan, MD;  Location: [facility] ORS;  Service: ENT;  Laterality: Right;    Patient Active Problem List   Diagnosis Date Noted   Urge urinary incontinence 04/02/2023   Incontinence of feces 04/02/2023   Vaginal atrophy 04/02/2023   Asthma, persistent controlled 02/06/2023   Continuous leakage of urine 01/04/2023   De Quervain's tenosynovitis, right 10/08/2022   Hot flashes due to menopause 07/17/2022   Convulsions (HCC) 07/09/2022   Obesity due to excess calories 09/22/2020   Insomnia due to medical condition 05/11/2020   Hamstring tendonitis 01/29/2020   MDD (major depressive disorder), recurrent, in full remission (HCC) 08/12/2019   Seizure-like activity (HCC) 07/08/2019   Mild episode of recurrent major depressive disorder (HCC) 12/29/2018   Insomnia due to mental condition 12/29/2018   Disorder of vein 03/04/2018   Lumbar sprain 03/04/2018   Muscle weakness 03/04/2018   Neck sprain 03/04/2018   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   Postmenopausal bleeding 02/18/2018   Impingement syndrome of shoulder region 02/04/2018   Chest pain with moderate risk for cardiac etiology 05/19/2017  GAD (generalized anxiety disorder) 04/15/2017   Chronic daily headache 04/15/2017   Panic attack 04/15/2017   Nocturnal enuresis 04/15/2017   Mild cognitive impairment 04/15/2017   Loss of memory 01/14/2017   Pain medication agreement signed 01/08/2017   Headache disorder 12/04/2016   Chronic, continuous use of opioids 07/01/2015   Vertigo 07/01/2015   Chronic midline low back pain without sciatica 03/21/2015   Type 2 diabetes mellitus with other specified complication (HCC) 09/05/2014   Spastic  diplegic cerebral palsy (HCC) 01/05/2014   Hip pain, chronic 04/28/2013   Essential hypertension 01/10/2013   Irritable colon 01/10/2013   Encounter for long-term (current) use of other medications 12/04/2012   Chronic GERD 08/12/2012   Diarrhea 08/12/2012   Primary localized osteoarthrosis, lower leg 05/07/2012   Difficulty walking 02/06/2012     ONSET DATE: chronic, congential   REFERRING DIAG: spastic diplegic CP; Rt leg pain; Lt leg pain   THERAPY DIAG:  Difficulty in walking, not elsewhere classified  Rationale for Evaluation and Treatment: Rehabilitation    SUBJECTIVE:                                                                                                                                                                                             SUBJECTIVE STATEMENT: Pt states that she has been feeling increased fatigue since getting over a cold.  PERTINENT HISTORY:  Traci Davis is a 51yoF who is referred to this clinic after a period of immobility and subsequent weakness upon resumption. Pt had a Rt wrist surgery in September and did not feel safe AMB with wrist splint. Pt reports her legs are much weakness now and she is having trouble mobilizing at her baseline. Pt familiar to this clinic from prior episodes.   PAIN:  Are you having pain? Yes, bilat leg pain, 7/10 bilat anterior thighs progressive with walking, resolves with rest/sitting. Pt reports chronic left lower leg pain which has been responsive to gabapentin for several weeks now.     PRECAUTIONS: Wears Left KAFO, Rt knee OA brace; has been released from Rt wrist splint but wears it when mobilizing due to soreness and fear of hitting it on something.  WEIGHT BEARING RESTRICTIONS: WBAT RUE  FALLS: Has patient fallen in last 6 months? 3 falls in past 6 months    PLOF: mod I AMB at baseline; platform walking in household, sometimes short community distances; power scooter or manual WC for community  distance AMB; pt drives  PATIENT GOALS: help return to her baseline in AMB overground   OBJECTIVE:  Note: Objective measures were completed at Evaluation unless otherwise noted.  LOWER EXTREMITY ROM:    P/ROM  Right Eval Left Eval  Hip flexion    Hip extension    Hip abduction    Hip adduction    Hip internal rotation    Hip external rotation    Knee flexion    Knee extension    Ankle dorsiflexion    Ankle plantarflexion    Ankle inversion    Ankle eversion      LOWER EXTREMITY STRENGTH:    MMT Right Eval Left Eval  Hip flexion    Hip extension    Hip abduction    Hip adduction    Hip internal rotation    Hip external rotation    Knee flexion    Knee extension    Ankle dorsiflexion    Ankle plantarflexion    Ankle inversion    Ankle eversion    (Blank rows = not tested)  BED MOBILITY:  Sleeps in a regular bed, uses a trapeze for repositioning; no adjustment, no bed rails   TRANSFERS: Assistive device utilized: ModI STS form platform walking   RAMP:  Level of Assistance: can typically manage c with WC or with platform walker   CURB:  Level of Assistance: can typically manage c with WC or with platform walker   STAIRS: Level of Assistance: does not AMB stairs at baseline   GAIT: Gait pattern: double platform walker with with Left KAFO, but will occasionally AMB the household without KAFO while in socks.  FUNCTIONAL TESTS:  -3xSTS from chair: hands ad lib, walker ad lib, Left KAFO, Rt knee brace: 19.95sec -178ft AMB: Left AFO, double platform RW; 2 minutes, 5 seconds (0.47m/s)   TODAY'S TREATMENT DATE:   05/20/23: All single LE exercises performed on RLE  Nu-Step Seat and Arms at 8 for 5 min  Sit to Stand 1 x 5 from 23 inch  Seated Long Arch Quad on RLE 1 x 10  HS Stretch on RLE 1  Seated Long Arch Quad on RLE #3 AW 1 x 10  Seated HS and Calf Stretch 2 x 30 sec  Standing Marches with RLE with BUE support 1 x 10    04/18/23: Seated Hip  Flexion Step Over yoga block  200 Amb: 61 meters in 135 sec= 0.45 m/sec   ModQoL7 Score=71%  5x STS: 22 sec    Straight Leg Raise on LLE 3 x 5  -min VC to tight stomach to decrease low back arching   Modified HEP to include focus on remaining deficits.    04/15/23: GAIT TRAINING  Lateral Weight Shifts from left to right side using BUE support with walker 2 x 10   RLE Step onto 4 inch yoga block with BUE support  1 x 10  RLE Step onto 4 inch plastic platform with BUE support  1 x 10  10 M ambulation with VC for increased left lateral weight shift x 4   -min  Standing Step Ups on RLE with lateral weight shift on Left and BUE support 1 x 10  Seated Hip Abduction with red band  2 x 10  -Decreased LLE abduction  04/08/23 Warm-up: LAQ with red band 1x10 per side, seated hamstring curl with red theraband 1x10 per side 3xSTS - 14.55sec 129ft walk - 1 min 15 seconds  - 5/10 on RPE Pt educated on sit to stand sequencing Sit to stand without hands 3x6  - CGA, min assist to control balance at the end of the last rep  -  7/10 on RPE  Pt educated on proper weight shift to unload stance leg Single leg marches in walker 1x10 on right with 3# AQ Single leg march in walker 1x8 on left with  Pt educated on how to reset feet when walking if her right foot gets caught under/behind her Pt returned demonstration aforementioned topic with min PT assist   04/03/23 - from session with another PT TherAct: Pt ambulated 2 laps around the gym for goal assessment noted below. TherEx: Seated LAQ, 2x10 each LE Seated hamstring curls with YTB, 2x10 Seated hip adduction into green physioball, 3 sec holds, 2x10 Seated resisted abduction into green physioball, 2x10 STS x10 with brace unlocked   PATIENT EDUCATION: Education details: Patient  Person educated: plan for assessment this date and how to generate goals for improvement Education method: Discussion Education comprehension: good   HOME  EXERCISE PROGRAM: Access Code: [email or web address] URL: [email or web address] Date: 05/20/2023 Prepared by: Ellin Goodie  Exercises - Long Sitting Calf Stretch with Strap  - 1 x daily - 3 reps - 60 sec sec  hold - Seated Hip Abduction with Resistance  - 3-4 x weekly - 3 sets - 10 reps - Sit to Stand with Counter Support  - 3-4 x weekly - 2 sets - 10 reps - Standing Forward Step Taps with Counter Support  - 3-4 x weekly - 3 sets - 10 reps - Supine Active Straight Leg Raise  - 3-4 x weekly - 3 sets - 5 reps   GOALS: Goals reviewed with patient? Yes  SHORT TERM GOALS: Target date: 03/31/23  Pt to perform 3xSTS in <16 sec  Baseline: eval: 19.95sec  12/10: 14.55 sec   Goal status: MET  2.  Pt to report regular performance of starter HEP at home and readiness/confidence to progress to additional standing activity.  Baseline: issued at eval, not regularly performing prior.  04/18/23: MET- performing independently  Goal status: MET   3.  Pt to demonstrate 123ft AMB in clinic in <1 minute 40 seconds.  Baseline: eval: 38m5s   12/10: 23m 15s   Goal status: MET   LONG TERM GOALS: Target date: 04/22/23  Pt to perform 5xSTS in <19.95 sec  Baseline: eval: 3xSTS in 19.95sec 12/10: 3xSTS 14.55sec 04/18/23: 5 x STS 22.88 sec  Goal status: Partially Met   2.  Pt to demonstrate tolerance of 240ft AMB without rest break, average speed faster than 0.43m/s Baseline: 0.29 m/s   04/18/23: 0.45 m/sec  Goal status: ACHIEVED   3.  Pt to improve score on MobQoL report measure by >20% to indicate improved mobility-specific quality of life.  Baseline: 03/11/23: 0.54  04/18/23: 0.71 % Goal status: Partially Met     ASSESSMENT:  CLINICAL IMPRESSION: Pt limited by fatigue throughout session that is likely due to recovering from recent bout of sickness. Exercises modified to include mostly seated and standing positions to avoid exhaustion. She was able to perform all exercises with added rest  breaks. Pt will continue to benefit from skilled therapy to address deficits in order to improve overall QoL and perform ADLs with increased function.   OBJECTIVE IMPAIRMENTS: Abnormal gait, cardiopulmonary status limiting activity, decreased activity tolerance, decreased balance, decreased coordination, decreased endurance, decreased mobility, difficulty walking, decreased ROM, and decreased strength.   ACTIVITY LIMITATIONS: standing, squatting, stairs, transfers, bed mobility, and bathing  PARTICIPATION LIMITATIONS: cleaning, medication management, interpersonal relationship, driving, shopping, and community activity  PERSONAL FACTORS: Age, Behavior pattern, Education, and Past/current experiences are also affecting patient's  functional outcome.   REHAB POTENTIAL: Good  CLINICAL DECISION MAKING: Stable/uncomplicated  EVALUATION COMPLEXITY: Moderate  PLAN:  PT FREQUENCY: 1-2x/week  PT DURATION: 6 weeks  PLANNED INTERVENTIONS: 97110-Therapeutic exercises, 97530- Therapeutic activity, [id number]- Neuromuscular re-education, 97535- Self Care, 29518- Manual therapy, [id number]- Subsequent splinting/medication, 97014- Electrical stimulation (unattended), [id number]- Electrical stimulation (manual), 84166- Traction (mechanical), Patient/Family education, and Balance training  PLAN FOR NEXT SESSION:  Circuits that include ambulation followed by step taps followed by ambulation followed by sit to stands.    Ellin Goodie PT, DPT  [facility] [address] [place], [address] Phone: [phone]   Fax:  [phone]

## 2023-05-21 ENCOUNTER — Ambulatory Visit (INDEPENDENT_AMBULATORY_CARE_PROVIDER_SITE_OTHER): Payer: 59 | Admitting: Family Medicine

## 2023-05-21 ENCOUNTER — Ambulatory Visit
Admission: RE | Admit: 2023-05-21 | Discharge: 2023-05-21 | Disposition: A | Discharge status: Home / Self Care | Payer: 59 | Source: Home / Self Care | Attending: Family Medicine | Admitting: Family Medicine

## 2023-05-21 ENCOUNTER — Ambulatory Visit
Admission: RE | Admit: 2023-05-21 | Discharge: 2023-05-21 | Disposition: A | Discharge status: Home / Self Care | Payer: 59 | Source: Ambulatory Visit | Attending: Family Medicine | Admitting: Family Medicine

## 2023-05-21 ENCOUNTER — Encounter: Payer: Self-pay | Admitting: Family Medicine

## 2023-05-21 VITALS — BP 130/78 | HR 102 | Ht 62.0 in | Wt 201.0 lb

## 2023-05-21 DIAGNOSIS — G801 Spastic diplegic cerebral palsy: Secondary | ICD-10-CM

## 2023-05-21 DIAGNOSIS — M20002 Unspecified deformity of left finger(s): Secondary | ICD-10-CM

## 2023-05-21 DIAGNOSIS — M2540 Effusion, unspecified joint: Secondary | ICD-10-CM | POA: Insufficient documentation

## 2023-05-21 DIAGNOSIS — M151 Heberden's nodes (with arthropathy): Secondary | ICD-10-CM | POA: Diagnosis not present

## 2023-05-21 DIAGNOSIS — Z794 Long term (current) use of insulin: Secondary | ICD-10-CM

## 2023-05-21 DIAGNOSIS — E1169 Type 2 diabetes mellitus with other specified complication: Secondary | ICD-10-CM

## 2023-05-21 DIAGNOSIS — K861 Other chronic pancreatitis: Secondary | ICD-10-CM

## 2023-05-21 LAB — POCT GLYCOSYLATED HEMOGLOBIN (HGB A1C): Hemoglobin A1C: 8 % — AB (ref 4.0–5.6)

## 2023-05-21 NOTE — Patient Instructions (Addendum)
Thank you for coming to the office today.  Recent Labs    01/22/23 0000 05/21/23 1525  HGBA1C 8 8.0*   I recommend dose increase insulin Lantus from 30 up to + 2 units each week if fasting sugar is >150. Max dose is 40 units.  Remain off Jardiance  X-ray today  Lab testing to check for inflammatory arthritis.   Please schedule a Follow-up Appointment to: Return in about 4 months (around 09/18/2023) for 4 month DM A1c.  If you have any other questions or concerns, please feel free to call the office or send a message through MyChart. You may also schedule an earlier appointment if necessary.  Additionally, you may be receiving a survey about your experience at our office within a few days to 1 week by e-mail or mail. We value your feedback.  Saralyn Pilar, DO [facility], [place]

## 2023-05-21 NOTE — Progress Notes (Signed)
Subjective:    Patient ID: Traci Davis, female    DOB: [birth year], 53 y.o.   MRN: [id number]  Traci Davis is a 53 y.o. female presenting on 05/21/2023 for Diabetes   HPI  Discussed the use of AI scribe software for clinical note transcription with the patient, who gave verbal consent to proceed.  History of Present Illness        Left hand deformity L hand index finger radial deviation and some swelling joint pain, question if arthritis. Asking for labs and imaging.   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Followed by [facility] Dr Windle Guard - PM&R Chronic issue with LEFT lower extremity weakness deficit She receives Botox injections and valium AS NEEDED prior to injection She follows with outpatient [facility] physical therapy, often when she has any issue with difficulty walking. Usually 2 x per year. Normally she uses a stand up walker fore her forearm, but cannot use this now due to R hand injury. Also she has an Passenger transport manager power mobility device at home she uses regularly She was recently started on Gabapentin which has improved nerve and muscle pain in her legs related to CP spasms. She continues on Flexeril 10mg  THREE TIMES A DAY. Diazepam is only for botox injections   CHRONIC DM, Type 2 Abdominal Pain / History of Pancreatitis Updates 3 weeks off Jardiance and back on Lantus 30 units Diet improving more water A1c stable at 8.0 since last visit CBG 150-170 Meds: Metformin XR 500mg  x 2 = 1000mg  TWICE A DAY, Lantus 30 units daily Reports  good compliance. Tolerating well w/o side-effects Denies hypoglycemia, polyuria, visual changes, numbness or tingling.   Acute Pancreatitis in setting of Chronic Pancreatitis vs GI Dysfunction Now pancreatitis attack. She admits eating more spicy foods and some trigger to her symptoms.  S/p cholecystectomy. She had some pains in Left upper quadrant. Pain improves with resting and avoiding eating. - If she eats a full meal it can flare  it up  Followed by [facility]. She has Pain med Oxycodone and Zofran ODT AS NEEDED Reduced BY MOUTH intake now       02/27/2023    4:52 PM 12/06/2022    3:34 PM 04/05/2022    4:17 PM  Depression screen PHQ 2/9  Decreased Interest 0    Down, Depressed, Hopeless 0    PHQ - 2 Score 0    Altered sleeping     Tired, decreased energy     Change in appetite     Feeling bad or failure about yourself      Trouble concentrating     Moving slowly or fidgety/restless     Suicidal thoughts     PHQ-9 Score        Information is confidential and restricted. Go to Review Flowsheets to unlock data.       12/06/2022    3:34 PM 04/05/2022    4:18 PM 06/06/2021    4:51 PM  GAD 7 : Generalized Anxiety Score  Nervous, Anxious, on Edge     Control/stop worrying     Worry too much - different things     Trouble relaxing     Restless     Easily annoyed or irritable     Afraid - awful might happen     Total GAD 7 Score     Anxiety Difficulty        Information is confidential and restricted. Go to Review Flowsheets to unlock data.  Social History   Tobacco Use   Smoking status: Never   Smokeless tobacco: Never  Vaping Use   Vaping status: Never Used  Substance Use Topics   Alcohol use: No   Drug use: No    Review of Systems Per HPI unless specifically indicated above     Objective:    BP 130/78   Pulse (!) 102   Ht 5\' 2"  (1.575 m)   Wt 201 lb (91.2 kg)   LMP 08/20/2014 Comment: missed cup when trying to obtain urine spec  SpO2 98%   BMI 36.76 kg/m   Wt Readings from Last 3 Encounters:  05/21/23 201 lb (91.2 kg)  05/13/23 198 lb (89.8 kg)  04/02/23 196 lb 6.4 oz (89.1 kg)    Physical Exam Vitals and nursing note reviewed.  Constitutional:      General: She is not in acute distress.    Appearance: Normal appearance. She is well-developed. She is obese. She is not diaphoretic.     Comments: Well-appearing, comfortable, cooperative  HENT:     Head:  Normocephalic and atraumatic.  Eyes:     General:        Right eye: No discharge.        Left eye: No discharge.     Conjunctiva/sclera: Conjunctivae normal.  Cardiovascular:     Rate and Rhythm: Normal rate.  Pulmonary:     Effort: Pulmonary effort is normal.  Abdominal:     General: Bowel sounds are normal.     Palpations: There is no mass.     Tenderness: There is abdominal tenderness. There is no guarding.  Musculoskeletal:     Comments: Left index finger with some radial deviation present distally and some slight inc bulky DIP  Left sided reduced mobility with CP chronic  Rolling walker  Skin:    General: Skin is warm and dry.     Findings: No erythema or rash.  Neurological:     Mental Status: She is alert and oriented to person, place, and time.  Psychiatric:        Mood and Affect: Mood normal.        Behavior: Behavior normal.        Thought Content: Thought content normal.     Comments: Well groomed, good eye contact, normal speech and thoughts     Results for orders placed or performed in visit on 05/21/23  POCT HgB A1C   Collection Time: 05/21/23  3:25 PM  Result Value Ref Range   Hemoglobin A1C 8.0 (A) 4.0 - 5.6 %   HbA1c POC (<> result, manual entry)     HbA1c, POC (prediabetic range)     HbA1c, POC (controlled diabetic range)        Assessment & Plan:   Problem List Items Addressed This Visit     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(HCC)   Type 2 diabetes mellitus with other specified complication (HCC) - Primary   Relevant Orders   POCT HgB A1C (Completed)   Other Visit Diagnoses       Finger deformity, acquired, left       Relevant Orders   DG Hand Complete Left   Sed Rate (ESR)   C-reactive protein   Cyclic citrul peptide antibody, IgG   Rheumatoid Factor   ANA     Painful swelling of joint       Relevant Orders   DG Hand Complete Left   Sed Rate (ESR)   C-reactive protein  Cyclic citrul peptide antibody, IgG   Rheumatoid Factor    ANA     Idiopathic chronic pancreatitis (HCC)            Type 2 Diabetes Mellitus Currently also on Metformin. HbA1c is 8.0, unchanged from previous visit. Blood glucose readings range from 150-170. Unable to tolerate Jardiance due to yeast infection. -Continue Lantus 30u and Metformin. -Gradually increase Lantus dose by 2 units weekly as needed, aiming for blood glucose readings below 150. -Check HbA1c in 4 months.  Acute on Chronic Pancreatitis S/p Choleycstectomy. Patient experiencing a flare-up due to consumption of spicy food. Currently managing symptoms with pain and nausea medication. -Continue current management strategy. -Consider hospitalization for IV fluids if symptoms become severe and unmanageable.  Hand Deformity, L  Patient reports spasms and contracture in left hand, possibly related to cerebral palsy. Also reports a change in bone alignment in both hands over the past 4-5 months.  -Order x-ray of left hand. Eval for arthritis -Order blood work for inflammatory autoimmune panel to check for rheumatoid arthritis. -Consider referral to a specialist depending on results.         Orders Placed This Encounter  Procedures   DG Hand Complete Left    Standing Status:   Future    Number of Occurrences:   1    Expiration Date:   05/20/2024    Reason for Exam (SYMPTOM  OR DIAGNOSIS REQUIRED):   left index finger contracture and pain stiffness, DIP joint    Is patient pregnant?:   No    Preferred imaging location?:   [facility]   Sed Rate (ESR)   C-reactive protein   Cyclic citrul peptide antibody, IgG   Rheumatoid Factor   ANA   POCT HgB A1C    No orders of the defined types were placed in this encounter.   Follow up plan: Return in about 4 months (around 09/18/2023) for 4 month DM A1c.  Saralyn Pilar, DO [facility] 05/21/2023, 3:35 PM

## 2023-05-22 ENCOUNTER — Ambulatory Visit: Payer: 59 | Admitting: Physical Therapy

## 2023-05-22 ENCOUNTER — Encounter: Payer: 59 | Admitting: Physical Therapy

## 2023-05-22 DIAGNOSIS — Z8249 Family history of ischemic heart disease and other diseases of the circulatory system: Secondary | ICD-10-CM | POA: Diagnosis not present

## 2023-05-22 DIAGNOSIS — Z7984 Long term (current) use of oral hypoglycemic drugs: Secondary | ICD-10-CM | POA: Diagnosis not present

## 2023-05-22 DIAGNOSIS — Z888 Allergy status to other drugs, medicaments and biological substances status: Secondary | ICD-10-CM | POA: Diagnosis not present

## 2023-05-22 DIAGNOSIS — Z881 Allergy status to other antibiotic agents status: Secondary | ICD-10-CM | POA: Diagnosis not present

## 2023-05-22 DIAGNOSIS — N3090 Cystitis, unspecified without hematuria: Secondary | ICD-10-CM | POA: Diagnosis not present

## 2023-05-22 DIAGNOSIS — Z883 Allergy status to other anti-infective agents status: Secondary | ICD-10-CM | POA: Diagnosis not present

## 2023-05-22 DIAGNOSIS — Z885 Allergy status to narcotic agent status: Secondary | ICD-10-CM | POA: Diagnosis not present

## 2023-05-22 DIAGNOSIS — Z886 Allergy status to analgesic agent status: Secondary | ICD-10-CM | POA: Diagnosis not present

## 2023-05-22 DIAGNOSIS — M545 Low back pain, unspecified: Secondary | ICD-10-CM | POA: Diagnosis not present

## 2023-05-22 DIAGNOSIS — Z993 Dependence on wheelchair: Secondary | ICD-10-CM | POA: Diagnosis not present

## 2023-05-22 DIAGNOSIS — K219 Gastro-esophageal reflux disease without esophagitis: Secondary | ICD-10-CM | POA: Diagnosis not present

## 2023-05-22 DIAGNOSIS — K3184 Gastroparesis: Secondary | ICD-10-CM | POA: Diagnosis not present

## 2023-05-22 DIAGNOSIS — K861 Other chronic pancreatitis: Secondary | ICD-10-CM | POA: Diagnosis not present

## 2023-05-22 DIAGNOSIS — G8929 Other chronic pain: Secondary | ICD-10-CM | POA: Diagnosis not present

## 2023-05-22 DIAGNOSIS — Z794 Long term (current) use of insulin: Secondary | ICD-10-CM | POA: Diagnosis not present

## 2023-05-22 DIAGNOSIS — Z91048 Other nonmedicinal substance allergy status: Secondary | ICD-10-CM | POA: Diagnosis not present

## 2023-05-22 DIAGNOSIS — J449 Chronic obstructive pulmonary disease, unspecified: Secondary | ICD-10-CM | POA: Diagnosis not present

## 2023-05-22 DIAGNOSIS — E119 Type 2 diabetes mellitus without complications: Secondary | ICD-10-CM | POA: Diagnosis not present

## 2023-05-22 DIAGNOSIS — R1012 Left upper quadrant pain: Secondary | ICD-10-CM | POA: Diagnosis not present

## 2023-05-22 DIAGNOSIS — K8500 Idiopathic acute pancreatitis without necrosis or infection: Secondary | ICD-10-CM | POA: Diagnosis not present

## 2023-05-22 DIAGNOSIS — E1143 Type 2 diabetes mellitus with diabetic autonomic (poly)neuropathy: Secondary | ICD-10-CM | POA: Diagnosis not present

## 2023-05-22 DIAGNOSIS — I10 Essential (primary) hypertension: Secondary | ICD-10-CM | POA: Diagnosis not present

## 2023-05-22 DIAGNOSIS — Z79891 Long term (current) use of opiate analgesic: Secondary | ICD-10-CM | POA: Diagnosis not present

## 2023-05-22 DIAGNOSIS — G809 Cerebral palsy, unspecified: Secondary | ICD-10-CM | POA: Diagnosis not present

## 2023-05-22 DIAGNOSIS — K858 Other acute pancreatitis without necrosis or infection: Secondary | ICD-10-CM | POA: Diagnosis not present

## 2023-05-22 DIAGNOSIS — R7989 Other specified abnormal findings of blood chemistry: Secondary | ICD-10-CM | POA: Diagnosis not present

## 2023-05-22 DIAGNOSIS — Z79899 Other long term (current) drug therapy: Secondary | ICD-10-CM | POA: Diagnosis not present

## 2023-05-26 LAB — ANA: Anti Nuclear Antibody (ANA): NEGATIVE

## 2023-05-26 LAB — CYCLIC CITRUL PEPTIDE ANTIBODY, IGG: Cyclic Citrullin Peptide Ab: <16 U

## 2023-05-26 LAB — RHEUMATOID FACTOR: Rheumatoid fact SerPl-aCnc: <10 [IU]/mL (ref <14)

## 2023-05-26 LAB — C-REACTIVE PROTEIN: CRP: 43.9 mg/L — ABNORMAL HIGH (ref <8.0)

## 2023-05-26 LAB — SEDIMENTATION RATE: Sed Rate: 28 mm/h (ref 0–30)

## 2023-05-27 ENCOUNTER — Ambulatory Visit: Payer: 59 | Admitting: Physical Therapy

## 2023-05-27 ENCOUNTER — Telehealth: Payer: Self-pay | Admitting: Family Medicine

## 2023-05-27 DIAGNOSIS — G8929 Other chronic pain: Secondary | ICD-10-CM

## 2023-05-27 DIAGNOSIS — M25561 Pain in right knee: Secondary | ICD-10-CM | POA: Diagnosis not present

## 2023-05-27 DIAGNOSIS — G801 Spastic diplegic cerebral palsy: Secondary | ICD-10-CM | POA: Diagnosis not present

## 2023-05-27 DIAGNOSIS — R262 Difficulty in walking, not elsewhere classified: Secondary | ICD-10-CM | POA: Diagnosis not present

## 2023-05-27 NOTE — Telephone Encounter (Signed)
Copied from CRM [id number]. Topic: General - Other >> May 27, 2023  4:01 PM [place] B wrote: Reason for CRM: pt called in states she needs a note for physical therapy, stating she can still continue PT    Received this request today that patient needs a note.  I am routing this conversation over to patient's Physical Therapist Ellin Goodie to collaborate on this request.  Reuel Boom - Can you share more information on what precisely she needs from our office? Do we need a new referral or a letter written? Is this for you or the insurance? I am unfamiliar with this type of request actually. Thanks for your time!  Saralyn Pilar, DO [facility] 05/27/2023, 5:14 PM

## 2023-05-27 NOTE — Telephone Encounter (Signed)
Copied from CRM [id number]. Topic: General - Other >> May 27, 2023  4:01 PM [place] B wrote: Reason for CRM: pt called in states she needs a note for physical therapy, stating she can still continue PT

## 2023-05-27 NOTE — Therapy (Signed)
OUTPATIENT PHYSICAL THERAPY TREATMENT   Patient Name: Traci Davis MRN: [id number] DOB:Oct 28, 1970, 53 y.o., female Today's Date: 03/11/2023   PCP: Traci Pilar, DO REFERRING PROVIDER: Windle Guard, DO   END OF SESSION:  PT End of Session - 05/27/23 1607     Visit Number 8    Number of Visits 12    Date for PT Re-Evaluation 06/24/23    Authorization Type UHC dual complete; [place] [employer]- Medical Necessity    Authorization Time Period 04/23/23-06/24/23    Authorization - Visit Number 8    Authorization - Number of Visits 12    Progress Note Due on Visit 10    PT Start Time 1605    PT Stop Time 1645    PT Time Calculation (min) 40 min    Activity Tolerance Patient tolerated treatment well    Behavior During Therapy [facility] for tasks assessed/performed                  Past Medical History:  Diagnosis Date   Acute pancreatitis 09/04/2014   Anxiety    Arthritis    Asthma    Cerebral palsy (HCC)    Complication of anesthesia    panic attacks before surgery   COPD (chronic obstructive pulmonary disease) (HCC)    Depression    Diabetes mellitus without complication (HCC)    GERD (gastroesophageal reflux disease)    Hypertension    Noninfectious gastroenteritis and colitis 01/10/2013    Past Surgical History:  Procedure Laterality Date   CESAREAN SECTION  1995   CHOLECYSTECTOMY     DILATATION & CURETTAGE/HYSTEROSCOPY WITH MYOSURE N/A 02/20/2018   Procedure: DILATATION & CURETTAGE/HYSTEROSCOPY WITH MYOSURE ENDOMETRIAL POLYPECTOMY;  Surgeon: Conard Novak, MD;  Location: [facility];  Service: Gynecology;  Laterality: N/A;   ESOPHAGOGASTRODUODENOSCOPY (EGD) WITH PROPOFOL N/A 01/01/2019   Procedure: ESOPHAGOGASTRODUODENOSCOPY (EGD) WITH PROPOFOL;  Surgeon: Toney Reil, MD;  Location: [facility];  Service: Gastroenterology;  Laterality: N/A;   FOOT CAPSULE RELEASE W/ PERCUTANEOUS HEEL CORD LENGTHENING, TIBIAL TENDON TRANSFER     age 8 ,and  2010-both legs   JOINT REPLACEMENT Right    both knees partial replacements dates unknown   knee replacment     x 5 per pt. last one- left knee 2014. has had 2 on R 3 on L   TYMPANOPLASTY WITH GRAFT Right 01/08/2018   Procedure: TYMPANOPLASTY WITH POSSIBLE OSSICULAR CHAIN RECONSTRUCTION;  Surgeon: Geanie Logan, MD;  Location: [facility];  Service: ENT;  Laterality: Right;    Patient Active Problem List   Diagnosis Date Noted   Urge urinary incontinence 04/02/2023   Incontinence of feces 04/02/2023   Vaginal atrophy 04/02/2023   Asthma, persistent controlled 02/06/2023   Continuous leakage of urine 01/04/2023   De Quervain's tenosynovitis, right 10/08/2022   Hot flashes due to menopause 07/17/2022   Convulsions (HCC) 07/09/2022   Obesity due to excess calories 09/22/2020   Insomnia due to medical condition 05/11/2020   Hamstring tendonitis 01/29/2020   MDD (major depressive disorder), recurrent, in full remission (HCC) 08/12/2019   Seizure-like activity (HCC) 07/08/2019   Mild episode of recurrent major depressive disorder (HCC) 12/29/2018   Insomnia due to mental condition 12/29/2018   Disorder of vein 03/04/2018   Lumbar sprain 03/04/2018   Muscle weakness 03/04/2018   Neck sprain 03/04/2018   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   Postmenopausal bleeding 02/18/2018   Impingement syndrome of shoulder region 02/04/2018   Chest pain with moderate risk for cardiac etiology  05/19/2017   GAD (generalized anxiety disorder) 04/15/2017   Chronic daily headache 04/15/2017   Panic attack 04/15/2017   Nocturnal enuresis 04/15/2017   Mild cognitive impairment 04/15/2017   Loss of memory 01/14/2017   Pain medication agreement signed 01/08/2017   Headache disorder 12/04/2016   Chronic, continuous use of opioids 07/01/2015   Vertigo 07/01/2015   Chronic midline low back pain without sciatica 03/21/2015   Type 2 diabetes mellitus with other specified complication (HCC) 09/05/2014   Spastic  diplegic cerebral palsy (HCC) 01/05/2014   Hip pain, chronic 04/28/2013   Essential hypertension 01/10/2013   Irritable colon 01/10/2013   Encounter for long-term (current) use of other medications 12/04/2012   Chronic GERD 08/12/2012   Diarrhea 08/12/2012   Primary localized osteoarthrosis, lower leg 05/07/2012   Difficulty walking 02/06/2012     ONSET DATE: chronic, congential   REFERRING DIAG: spastic diplegic CP; Rt leg pain; Lt leg pain   THERAPY DIAG:  Difficulty in walking, not elsewhere classified  Rationale for Evaluation and Treatment: Rehabilitation    SUBJECTIVE:                                                                                                                                                                                             SUBJECTIVE STATEMENT: Pt reports that she is feeling better since being discharge from hospital for acute pancreatitis.   PERTINENT HISTORY:  Traci Davis is a 51yoF who is referred to this clinic after a period of immobility and subsequent weakness upon resumption. Pt had a Rt wrist surgery in September and did not feel safe AMB with wrist splint. Pt reports her legs are much weakness now and she is having trouble mobilizing at her baseline. Pt familiar to this clinic from prior episodes.   PAIN:  Are you having pain? Yes, bilat leg pain, 7/10 bilat anterior thighs progressive with walking, resolves with rest/sitting. Pt reports chronic left lower leg pain which has been responsive to gabapentin for several weeks now.     PRECAUTIONS: Wears Left KAFO, Rt knee OA brace; has been released from Rt wrist splint but wears it when mobilizing due to soreness and fear of hitting it on something.  WEIGHT BEARING RESTRICTIONS: WBAT RUE  FALLS: Has patient fallen in last 6 months? 3 falls in past 6 months    PLOF: mod I AMB at baseline; platform walking in household, sometimes short community distances; power scooter or manual  WC for community distance AMB; pt drives  PATIENT GOALS: help return to her baseline in AMB overground   OBJECTIVE:  Note: Objective measures were completed at  Evaluation unless otherwise noted.  LOWER EXTREMITY ROM:    P/ROM  Right Eval Left Eval  Hip flexion    Hip extension    Hip abduction    Hip adduction    Hip internal rotation    Hip external rotation    Knee flexion    Knee extension    Ankle dorsiflexion    Ankle plantarflexion    Ankle inversion    Ankle eversion      LOWER EXTREMITY STRENGTH:    MMT Right Eval Left Eval  Hip flexion    Hip extension    Hip abduction    Hip adduction    Hip internal rotation    Hip external rotation    Knee flexion    Knee extension    Ankle dorsiflexion    Ankle plantarflexion    Ankle inversion    Ankle eversion    (Blank rows = not tested)  BED MOBILITY:  Sleeps in a regular bed, uses a trapeze for repositioning; no adjustment, no bed rails   TRANSFERS: Assistive device utilized: ModI STS form platform walking   RAMP:  Level of Assistance: can typically manage c with WC or with platform walker   CURB:  Level of Assistance: can typically manage c with WC or with platform walker   STAIRS: Level of Assistance: does not AMB stairs at baseline   GAIT: Gait pattern: double platform walker with with Left KAFO, but will occasionally AMB the household without KAFO while in socks.  FUNCTIONAL TESTS:  -3xSTS from chair: hands ad lib, walker ad lib, Left KAFO, Rt knee brace: 19.95sec -166ft AMB: Left AFO, double platform RW; 2 minutes, 5 seconds (0.72m/s)   TODAY'S TREATMENT DATE:   05/27/23: All single LE exercises performed on LLE  Overground ambulation 100 ft x 2   Seated Block Taps on LLE 1 X 10, 1 x 5  Standing toe taps with LLE on 10 lb plate 1 x 10  Mini-Squat with use of BUE support with 2WW 2 x 10  -min VC to increase posterior weight shift Standing Marches with BUE support non-alternating 2 x 10   Sit to Stand from elevated surface with 25 inch height with brace locked out 1 x 10     05/20/23: All single LE exercises performed on RLE  Nu-Step Seat and Arms at 8 for 5 min  Sit to Stand 1 x 5 from 23 inch  Seated Long Arch Quad on RLE 1 x 10  HS Stretch on RLE 1  Seated Long Arch Quad on RLE #3 AW 1 x 10  Seated HS and Calf Stretch 2 x 30 sec  Standing Marches with RLE with BUE support 1 x 10    04/18/23: Seated Hip Flexion Step Over yoga block  200 Amb: 61 meters in 135 sec= 0.45 m/sec   ModQoL7 Score=71%  5x STS: 22 sec    Straight Leg Raise on LLE 3 x 5  -min VC to tight stomach to decrease low back arching   Modified HEP to include focus on remaining deficits.    04/15/23: GAIT TRAINING  Lateral Weight Shifts from left to right side using BUE support with walker 2 x 10   RLE Step onto 4 inch yoga block with BUE support  1 x 10  RLE Step onto 4 inch plastic platform with BUE support  1 x 10  10 M ambulation with VC for increased left lateral weight shift x 4   -  min  Standing Step Ups on RLE with lateral weight shift on Left and BUE support 1 x 10  Seated Hip Abduction with red band  2 x 10  -Decreased LLE abduction  04/08/23 Warm-up: LAQ with red band 1x10 per side, seated hamstring curl with red theraband 1x10 per side 3xSTS - 14.55sec 157ft walk - 1 min 15 seconds  - 5/10 on RPE Pt educated on sit to stand sequencing Sit to stand without hands 3x6  - CGA, min assist to control balance at the end of the last rep  - 7/10 on RPE  Pt educated on proper weight shift to unload stance leg Single leg marches in walker 1x10 on right with 3# AQ Single leg march in walker 1x8 on left with  Pt educated on how to reset feet when walking if her right foot gets caught under/behind her Pt returned demonstration aforementioned topic with min PT assist   04/03/23 - from session with another PT TherAct: Pt ambulated 2 laps around the gym for goal assessment noted  below. TherEx: Seated LAQ, 2x10 each LE Seated hamstring curls with YTB, 2x10 Seated hip adduction into green physioball, 3 sec holds, 2x10 Seated resisted abduction into green physioball, 2x10 STS x10 with brace unlocked   PATIENT EDUCATION: Education details: Patient  Person educated: plan for assessment this date and how to generate goals for improvement Education method: Discussion Education comprehension: good   HOME EXERCISE PROGRAM: Access Code: [email or web address] URL: [email or web address] Date: 05/20/2023 Prepared by: Ellin Goodie  Exercises - Long Sitting Calf Stretch with Strap  - 1 x daily - 3 reps - 60 sec sec  hold - Seated Hip Abduction with Resistance  - 3-4 x weekly - 3 sets - 10 reps - Sit to Stand with Counter Support  - 3-4 x weekly - 2 sets - 10 reps - Standing Forward Step Taps with Counter Support  - 3-4 x weekly - 3 sets - 10 reps - Supine Active Straight Leg Raise  - 3-4 x weekly - 3 sets - 5 reps   GOALS: Goals reviewed with patient? Yes  SHORT TERM GOALS: Target date: 03/31/23  Pt to perform 3xSTS in <16 sec  Baseline: eval: 19.95sec  12/10: 14.55 sec   Goal status: MET  2.  Pt to report regular performance of starter HEP at home and readiness/confidence to progress to additional standing activity.  Baseline: issued at eval, not regularly performing prior.  04/18/23: MET- performing independently  Goal status: MET   3.  Pt to demonstrate 145ft AMB in clinic in <1 minute 40 seconds.  Baseline: eval: 78m5s   12/10: 61m 15s   Goal status: MET   LONG TERM GOALS: Target date: 04/22/23  Pt to perform 5xSTS in <19.95 sec  Baseline: eval: 3xSTS in 19.95sec 12/10: 3xSTS 14.55sec 04/18/23: 5 x STS 22.88 sec  Goal status: Partially Met   2.  Pt to demonstrate tolerance of 235ft AMB without rest break, average speed faster than 0.79m/s Baseline: 0.29 m/s   04/18/23: 0.45 m/sec  Goal status: ACHIEVED   3.  Pt to improve score on MobQoL  report measure by >20% to indicate improved mobility-specific quality of life.  Baseline: 03/11/23: 0.54  04/18/23: 0.71 % Goal status: Partially Met     ASSESSMENT:  CLINICAL IMPRESSION: Pt returning after recent hospitalization for acute pancreatitis. She was able to perform all exercises without being limited by fatigue or pain. She will continue to benefit from skilled therapy to address  deficits in order to improve overall QoL and perform ADLs with increased function.    OBJECTIVE IMPAIRMENTS: Abnormal gait, cardiopulmonary status limiting activity, decreased activity tolerance, decreased balance, decreased coordination, decreased endurance, decreased mobility, difficulty walking, decreased ROM, and decreased strength.   ACTIVITY LIMITATIONS: standing, squatting, stairs, transfers, bed mobility, and bathing  PARTICIPATION LIMITATIONS: cleaning, medication management, interpersonal relationship, driving, shopping, and community activity  PERSONAL FACTORS: Age, Behavior pattern, Education, and Past/current experiences are also affecting patient's functional outcome.   REHAB POTENTIAL: Good  CLINICAL DECISION MAKING: Stable/uncomplicated  EVALUATION COMPLEXITY: Moderate  PLAN:  PT FREQUENCY: 1-2x/week  PT DURATION: 6 weeks  PLANNED INTERVENTIONS: 97110-Therapeutic exercises, 97530- Therapeutic activity, [id number]- Neuromuscular re-education, 97535- Self Care, 82956- Manual therapy, [id number]- Subsequent splinting/medication, 97014- Electrical stimulation (unattended), [id number]- Electrical stimulation (manual), 21308- Traction (mechanical), Patient/Family education, and Balance training  PLAN FOR NEXT SESSION:  Continue with Circuits that include ambulation with cone weaves with mini-squats.    Ellin Goodie PT, DPT  [facility] [address], [place], [address] Phone: [phone]   Fax:  [phone]

## 2023-05-28 ENCOUNTER — Encounter: Payer: Self-pay | Admitting: Family Medicine

## 2023-05-28 ENCOUNTER — Encounter: Payer: 59 | Admitting: Physical Therapy

## 2023-05-28 ENCOUNTER — Telehealth: Payer: Self-pay

## 2023-05-28 NOTE — Transitions of Care (Post Inpatient/ED Visit) (Signed)
   05/28/2023  Name: Traci Davis MRN: [id number] DOB: [birth year]  Today's TOC FU Call Status: Today's TOC FU Call Status:: Unsuccessful Call (1st Attempt) Unsuccessful Call (1st Attempt) Date: 05/28/23  Attempted to reach the patient regarding the most recent Inpatient/ED visit.  Follow Up Plan: Additional outreach attempts will be made to reach the patient to complete the Transitions of Care (Post Inpatient/ED visit) call.   Deidre Ala, BSN, RN [place]  [facility] - [facility] RN Care Manager [phone]

## 2023-05-29 ENCOUNTER — Telehealth: Payer: Self-pay

## 2023-05-29 ENCOUNTER — Encounter: Payer: 59 | Admitting: Physical Therapy

## 2023-05-29 ENCOUNTER — Ambulatory Visit: Payer: 59 | Admitting: Family Medicine

## 2023-05-29 NOTE — Transitions of Care (Post Inpatient/ED Visit) (Signed)
   05/29/2023  Name: Traci Davis MRN: [id number] DOB: June 16, 1970  Today's TOC FU Call Status: Today's TOC FU Call Status:: Unsuccessful Call (2nd Attempt) Unsuccessful Call (1st Attempt) Date: 05/28/23 Unsuccessful Call (2nd Attempt) Date: 05/29/23  Attempted to reach the patient regarding the most recent Inpatient/ED visit.  Follow Up Plan: Additional outreach attempts will be made to reach the patient to complete the Transitions of Care (Post Inpatient/ED visit) call.   Deidre Ala, BSN, RN [name]  [facility]BC[facility] - [facility] RN Care Manager [phone]

## 2023-05-30 ENCOUNTER — Inpatient Hospital Stay: Payer: 59 | Admitting: Family Medicine

## 2023-05-30 ENCOUNTER — Ambulatory Visit: Payer: 59 | Admitting: Physical Therapy

## 2023-05-30 ENCOUNTER — Telehealth: Payer: Self-pay

## 2023-05-30 DIAGNOSIS — G8929 Other chronic pain: Secondary | ICD-10-CM | POA: Diagnosis not present

## 2023-05-30 DIAGNOSIS — G801 Spastic diplegic cerebral palsy: Secondary | ICD-10-CM

## 2023-05-30 DIAGNOSIS — M25561 Pain in right knee: Secondary | ICD-10-CM | POA: Diagnosis not present

## 2023-05-30 DIAGNOSIS — R262 Difficulty in walking, not elsewhere classified: Secondary | ICD-10-CM

## 2023-05-30 NOTE — Transitions of Care (Post Inpatient/ED Visit) (Signed)
   05/30/2023  Name: Traci Davis MRN: [id number] DOB: [birth year]  Today's TOC FU Call Status: Today's TOC FU Call Status:: Unsuccessful Call (3rd Attempt) Unsuccessful Call (3rd Attempt) Date: 05/30/23  Attempted to reach the patient regarding the most recent Inpatient/ED visit.  Follow Up Plan: No further outreach attempts will be made at this time. We have been unable to contact the patient.  Lonia Chimera, RN, BSN, CEN [employer]- Transition of Care Team.  [facility] [phone]

## 2023-05-30 NOTE — Therapy (Signed)
OUTPATIENT PHYSICAL THERAPY TREATMENT   Patient Name: Traci Davis MRN: [id number] DOB:1971/03/23, 53 y.o., female Today's Date: 03/11/2023   PCP: Saralyn Pilar, DO REFERRING PROVIDER: Windle Guard, DO   END OF SESSION:  PT End of Session - 05/30/23 1519     Visit Number 9    Number of Visits 12    Date for PT Re-Evaluation 06/24/23    Authorization Type UHC dual complete; [place] Medicaid- Medical Necessity    Authorization Time Period 04/23/23-06/24/23    Authorization - Visit Number 9    Authorization - Number of Visits 12    Progress Note Due on Visit 10    PT Start Time 1520    PT Stop Time 1600    PT Time Calculation (min) 40 min    Activity Tolerance Patient tolerated treatment well    Behavior During Therapy [facility] for tasks assessed/performed                  Past Medical History:  Diagnosis Date   Acute pancreatitis 09/04/2014   Anxiety    Arthritis    Asthma    Cerebral palsy (HCC)    Complication of anesthesia    panic attacks before surgery   COPD (chronic obstructive pulmonary disease) (HCC)    Depression    Diabetes mellitus without complication (HCC)    GERD (gastroesophageal reflux disease)    Hypertension    Noninfectious gastroenteritis and colitis 01/10/2013    Past Surgical History:  Procedure Laterality Date   CESAREAN SECTION  1995   CHOLECYSTECTOMY     DILATATION & CURETTAGE/HYSTEROSCOPY WITH MYOSURE N/A 02/20/2018   Procedure: DILATATION & CURETTAGE/HYSTEROSCOPY WITH MYOSURE ENDOMETRIAL POLYPECTOMY;  Surgeon: Conard Novak, MD;  Location: [facility] ORS;  Service: Gynecology;  Laterality: N/A;   ESOPHAGOGASTRODUODENOSCOPY (EGD) WITH PROPOFOL N/A 01/01/2019   Procedure: ESOPHAGOGASTRODUODENOSCOPY (EGD) WITH PROPOFOL;  Surgeon: Toney Reil, MD;  Location: [facility] ENDOSCOPY;  Service: Gastroenterology;  Laterality: N/A;   FOOT CAPSULE RELEASE W/ PERCUTANEOUS HEEL CORD LENGTHENING, TIBIAL TENDON TRANSFER     age 52 ,and  2010-both legs   JOINT REPLACEMENT Right    both knees partial replacements dates unknown   knee replacment     x 5 per pt. last one- left knee 2014. has had 2 on R 3 on L   TYMPANOPLASTY WITH GRAFT Right 01/08/2018   Procedure: TYMPANOPLASTY WITH POSSIBLE OSSICULAR CHAIN RECONSTRUCTION;  Surgeon: Geanie Logan, MD;  Location: [facility] ORS;  Service: ENT;  Laterality: Right;    Patient Active Problem List   Diagnosis Date Noted   Urge urinary incontinence 04/02/2023   Incontinence of feces 04/02/2023   Vaginal atrophy 04/02/2023   Asthma, persistent controlled 02/06/2023   Continuous leakage of urine 01/04/2023   De Quervain's tenosynovitis, right 10/08/2022   Hot flashes due to menopause 07/17/2022   Convulsions (HCC) 07/09/2022   Obesity due to excess calories 09/22/2020   Insomnia due to medical condition 05/11/2020   Hamstring tendonitis 01/29/2020   MDD (major depressive disorder), recurrent, in full remission (HCC) 08/12/2019   Seizure-like activity (HCC) 07/08/2019   Mild episode of recurrent major depressive disorder (HCC) 12/29/2018   Insomnia due to mental condition 12/29/2018   Disorder of vein 03/04/2018   Lumbar sprain 03/04/2018   Muscle weakness 03/04/2018   Neck sprain 03/04/2018   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   Postmenopausal bleeding 02/18/2018   Impingement syndrome of shoulder region 02/04/2018   Chest pain with moderate risk for cardiac etiology  05/19/2017   GAD (generalized anxiety disorder) 04/15/2017   Chronic daily headache 04/15/2017   Panic attack 04/15/2017   Nocturnal enuresis 04/15/2017   Mild cognitive impairment 04/15/2017   Loss of memory 01/14/2017   Pain medication agreement signed 01/08/2017   Headache disorder 12/04/2016   Chronic, continuous use of opioids 07/01/2015   Vertigo 07/01/2015   Chronic midline low back pain without sciatica 03/21/2015   Type 2 diabetes mellitus with other specified complication (HCC) 09/05/2014   Spastic  diplegic cerebral palsy (HCC) 01/05/2014   Hip pain, chronic 04/28/2013   Essential hypertension 01/10/2013   Irritable colon 01/10/2013   Encounter for long-term (current) use of other medications 12/04/2012   Chronic GERD 08/12/2012   Diarrhea 08/12/2012   Primary localized osteoarthrosis, lower leg 05/07/2012   Difficulty walking 02/06/2012     ONSET DATE: chronic, congential   REFERRING DIAG: spastic diplegic CP; Rt leg pain; Lt leg pain   THERAPY DIAG:  Difficulty in walking, not elsewhere classified  Rationale for Evaluation and Treatment: Rehabilitation    SUBJECTIVE:                                                                                                                                                                                             SUBJECTIVE STATEMENT: Pt reports that she feels increased muscle soreness since last visit.   PERTINENT HISTORY:  Traci Davis is a 51yoF who is referred to this clinic after a period of immobility and subsequent weakness upon resumption. Pt had a Rt wrist surgery in September and did not feel safe AMB with wrist splint. Pt reports her legs are much weakness now and she is having trouble mobilizing at her baseline. Pt familiar to this clinic from prior episodes.   PAIN:  Are you having pain? Yes, bilat leg pain, 7/10 bilat anterior thighs progressive with walking, resolves with rest/sitting. Pt reports chronic left lower leg pain which has been responsive to gabapentin for several weeks now.     PRECAUTIONS: Wears Left KAFO, Rt knee OA brace; has been released from Rt wrist splint but wears it when mobilizing due to soreness and fear of hitting it on something.  WEIGHT BEARING RESTRICTIONS: WBAT RUE  FALLS: Has patient fallen in last 6 months? 3 falls in past 6 months    PLOF: mod I AMB at baseline; platform walking in household, sometimes short community distances; power scooter or manual WC for community distance  AMB; pt drives  PATIENT GOALS: help return to her baseline in AMB overground   OBJECTIVE:  Note: Objective measures were completed at Evaluation unless otherwise noted.  LOWER EXTREMITY ROM:    P/ROM  Right Eval Left Eval  Hip flexion    Hip extension    Hip abduction    Hip adduction    Hip internal rotation    Hip external rotation    Knee flexion    Knee extension    Ankle dorsiflexion    Ankle plantarflexion    Ankle inversion    Ankle eversion      LOWER EXTREMITY STRENGTH:    MMT Right Eval Left Eval  Hip flexion    Hip extension    Hip abduction    Hip adduction    Hip internal rotation    Hip external rotation    Knee flexion    Knee extension    Ankle dorsiflexion    Ankle plantarflexion    Ankle inversion    Ankle eversion    (Blank rows = not tested)  BED MOBILITY:  Sleeps in a regular bed, uses a trapeze for repositioning; no adjustment, no bed rails   TRANSFERS: Assistive device utilized: ModI STS form platform walking   RAMP:  Level of Assistance: can typically manage c with WC or with platform walker   CURB:  Level of Assistance: can typically manage c with WC or with platform walker   STAIRS: Level of Assistance: does not AMB stairs at baseline   GAIT: Gait pattern: double platform walker with with Left KAFO, but will occasionally AMB the household without KAFO while in socks.  FUNCTIONAL TESTS:  -3xSTS from chair: hands ad lib, walker ad lib, Left KAFO, Rt knee brace: 19.95sec -129ft AMB: Left AFO, double platform RW; 2 minutes, 5 seconds (0.63m/s)   TODAY'S TREATMENT DATE:   05/30/23 Straight Leg Raise on LLE with brace locked into extension 1 x 5 Straight Leg Raise on LLE with brace locked into extension 2 x 8 Bridges with extended knee for HS bias 2 X 10  -Pt reports increased low back tightness.  Sit to Stand from 18 inch chair height 1 x 5  Sit to Stand with 22 inch chair height with back pack with 8 lbs and knee brace  locked on LLE 1 x 10  -Pt only loading RLE  Standing Hip Extension on LLE with BUE support and LLE brace locked 1 x 5  -Pt unable to perform without increased toe drag Bent over hip extension on LLE with BUE support and LLE brace locked 1 x 10 -Pt unable to extend hip beyond 5 deg flexion   05/27/23: All single LE exercises performed on LLE  Overground ambulation 100 ft x 2   Seated Block Taps on LLE 1 X 10, 1 x 5  Standing toe taps with LLE on 10 lb plate 1 x 10  Mini-Squat with use of BUE support with 2WW 2 x 10  -min VC to increase posterior weight shift Standing Marches with BUE support non-alternating 2 x 10  Sit to Stand from elevated surface with 25 inch height with brace locked out 1 x 10   05/20/23: All single LE exercises performed on RLE  Nu-Step Seat and Arms at 8 for 5 min  Sit to Stand 1 x 5 from 23 inch  Seated Long Arch Quad on RLE 1 x 10  HS Stretch on RLE 1  Seated Long Arch Quad on RLE #3 AW 1 x 10  Seated HS and Calf Stretch 2 x 30 sec  Standing Marches with RLE with BUE support 1 x 10  04/18/23: Seated Hip Flexion Step Over yoga block  200 Amb: 61 meters in 135 sec= 0.45 m/sec   ModQoL7 Score=71%  5x STS: 22 sec    Straight Leg Raise on LLE 3 x 5  -min VC to tight stomach to decrease low back arching   Modified HEP to include focus on remaining deficits.    04/15/23: GAIT TRAINING  Lateral Weight Shifts from left to right side using BUE support with walker 2 x 10   RLE Step onto 4 inch yoga block with BUE support  1 x 10  RLE Step onto 4 inch plastic platform with BUE support  1 x 10  10 M ambulation with VC for increased left lateral weight shift x 4   -min  Standing Step Ups on RLE with lateral weight shift on Left and BUE support 1 x 10  Seated Hip Abduction with red band  2 x 10  -Decreased LLE abduction    PATIENT EDUCATION: Education details: Patient  Person educated: plan for assessment this date and how to generate goals for  improvement Education method: Discussion Education comprehension: good   HOME EXERCISE PROGRAM: Access Code: [email or web address] URL: [email or web address] Date: 05/30/2023 Prepared by: Ellin Goodie  Exercises - Long Sitting Calf Stretch with Strap  - 1 x daily - 3 reps - 60 sec sec  hold - Supine Lower Trunk Rotation  - 3-4 x weekly - 2 sets - 10 reps - 10 sec  hold - Seated Hip Abduction with Resistance  - 3-4 x weekly - 3 sets - 10 reps - Sit to Stand with Counter Support  - 3-4 x weekly - 2 sets - 10 reps - Standing Forward Step Taps with Counter Support  - 3-4 x weekly - 3 sets - 10 reps - Supine Active Straight Leg Raise  - 3-4 x weekly - 3 sets - 5 reps  GOALS: Goals reviewed with patient? Yes  SHORT TERM GOALS: Target date: 03/31/23  Pt to perform 3xSTS in <16 sec  Baseline: eval: 19.95sec  12/10: 14.55 sec   Goal status: MET  2.  Pt to report regular performance of starter HEP at home and readiness/confidence to progress to additional standing activity.  Baseline: issued at eval, not regularly performing prior.  04/18/23: MET- performing independently  Goal status: MET   3.  Pt to demonstrate 132ft AMB in clinic in <1 minute 40 seconds.  Baseline: eval: 76m5s   12/10: 31m 15s   Goal status: MET   LONG TERM GOALS: Target date: 04/22/23  Pt to perform 5xSTS in <19.95 sec  Baseline: eval: 3xSTS in 19.95sec 12/10: 3xSTS 14.55sec 04/18/23: 5 x STS 22.88 sec  Goal status: Partially Met   2.  Pt to demonstrate tolerance of 23ft AMB without rest break, average speed faster than 0.57m/s Baseline: 0.29 m/s   04/18/23: 0.45 m/sec  Goal status: ACHIEVED   3.  Pt to improve score on MobQoL report measure by >20% to indicate improved mobility-specific quality of life.  Baseline: 03/11/23: 0.54  04/18/23: 0.71 % Goal status: Partially Met     ASSESSMENT:  CLINICAL IMPRESSION: Pt demonstrates ongoing LLE weakness and decreased left hip ROM that is resulting in  her overloading her RLE. Due to nature of spastic diplegia, pt shows difficulty with isolating movement on LLE especially with hip extension. She lack left hip extension ROM and it is unclear what is causing the limitation and it will require further evaluation. She will continue to benefit from  skilled therapy to address deficits in order to improve overall QoL and perform ADLs with increased function.    OBJECTIVE IMPAIRMENTS: Abnormal gait, cardiopulmonary status limiting activity, decreased activity tolerance, decreased balance, decreased coordination, decreased endurance, decreased mobility, difficulty walking, decreased ROM, and decreased strength.   ACTIVITY LIMITATIONS: standing, squatting, stairs, transfers, bed mobility, and bathing  PARTICIPATION LIMITATIONS: cleaning, medication management, interpersonal relationship, driving, shopping, and community activity  PERSONAL FACTORS: Age, Behavior pattern, Education, and Past/current experiences are also affecting patient's functional outcome.   REHAB POTENTIAL: Good  CLINICAL DECISION MAKING: Stable/uncomplicated  EVALUATION COMPLEXITY: Moderate  PLAN:  PT FREQUENCY: 1-2x/week  PT DURATION: 6 weeks  PLANNED INTERVENTIONS: 97110-Therapeutic exercises, 97530- Therapeutic activity, [id number]- Neuromuscular re-education, 97535- Self Care, 62130- Manual therapy, [id number]- Subsequent splinting/medication, 97014- Electrical stimulation (unattended), [id number]- Electrical stimulation (manual), 86578- Traction (mechanical), Patient/Family education, and Balance training  PLAN FOR NEXT SESSION:  Reassess goals. Check left hip ROM and level of spasticity. Continue with Circuits that include ambulation with cone weaves with mini-squats.    Ellin Goodie PT, DPT  [facility] [address] [place], [address] Phone: [phone]   Fax:  [phone]

## 2023-05-31 ENCOUNTER — Encounter: Payer: Self-pay | Admitting: Family Medicine

## 2023-05-31 ENCOUNTER — Ambulatory Visit (INDEPENDENT_AMBULATORY_CARE_PROVIDER_SITE_OTHER): Payer: 59 | Admitting: Family Medicine

## 2023-05-31 VITALS — BP 120/70 | HR 94 | Ht 62.0 in | Wt 201.0 lb

## 2023-05-31 DIAGNOSIS — K859 Acute pancreatitis without necrosis or infection, unspecified: Secondary | ICD-10-CM

## 2023-05-31 DIAGNOSIS — K861 Other chronic pancreatitis: Secondary | ICD-10-CM

## 2023-05-31 DIAGNOSIS — D509 Iron deficiency anemia, unspecified: Secondary | ICD-10-CM | POA: Diagnosis not present

## 2023-05-31 DIAGNOSIS — R7989 Other specified abnormal findings of blood chemistry: Secondary | ICD-10-CM

## 2023-05-31 NOTE — Patient Instructions (Addendum)
Thank you for coming to the office today.  I will message your pain doctor, Dr Antony Blackbird about the options for doing a back up plan medication for pancreatitis. You can speak with her at upcoming apt  Labs today, results next week.  Keep going slow on the diet.  Pancreatitis Eating Plan Pancreatitis is when your pancreas gets irritated and swollen (inflamed). The pancreas is a small gland behind your stomach. It helps your body digest food and regulate your blood sugar. Pancreatitis can affect how your body digests food, especially foods with fat. You may also have other symptoms such as pain in your abdomen (abdominal pain) or nausea. When you have pancreatitis, following a low-fat eating plan may help you manage symptoms and recover faster. Work with your health care provider or a dietitian to create an eating plan that is right for you. What are tips for following this plan? Reading food labels Use the information on food labels to help keep track of how much fat you eat: Check the serving size. Look for the amount of total fat in grams (g) in one serving. Low-fat foods have 3 g of fat or less per serving. Fat-free foods have 0.5 g of fat or less per serving. Keep track of how much fat you eat based on how many servings you eat. For example, if you eat two servings, the amount of fat you eat will be twice what is listed on the label. Shopping  Buy low-fat or nonfat foods, such as: Fresh, frozen, or canned fruits and vegetables. Grains, including pasta, bread, and rice. Lean meat, poultry, fish, and other protein foods. Low-fat or nonfat dairy. Avoid buying bakery products and other sweets made with whole milk, butter, and eggs. Avoid buying snack foods with added fat, such as anything with butter or cheese flavoring. Cooking Remove skin from poultry, and remove extra fat from meat. Limit the amount of fat and oil you use to 6 tsp (30 mL) or less per day. Cook using low-fat  methods, such as boiling, broiling, grilling, steaming, or baking. Use spray oil to cook. Add fat-free chicken broth to add flavor and moisture. Avoid adding cream to thicken soups or sauces. Use other thickeners such as corn starch or tomato paste. Meal planning  Eat a low-fat diet as told by your dietitian. For most people, this means having no more than 55-65 g of fat each day. Eat small, frequent meals throughout the day. For example, you may have 5 or 6 small meals instead of 3 large meals. Drink enough fluid to keep your urine pale yellow. Do not drink alcohol. Talk to your health care provider if you need help stopping. Limit how much caffeine you have, including black coffee, black and green tea, soft drinks with caffeine, and energy drinks. Plan to include a variety of foods in your diet. Include fruits, vegetables, whole grains and lean proteins, and low-fat or nonfat dairy. You need a balanced diet for good overall health. General information Let your health care provider or dietitian know if you have unplanned weight loss on this eating plan. You may be told to follow a clear liquid or soft food diet when symptoms come back, which is called a flare. Talk with your health care provider about how to manage your diet during symptoms of a flare. Take any vitamins or supplements as told by your health care provider. You may need to take: Extra vitamins that dissolve in fat (are fat soluble), such as  vitamins A, D, E, and K. Nutritional supplements. Work with a Data processing manager, especially if you have other conditions such as obesity, osteoporosis, or diabetes mellitus. Some people need extra treatments, such as: Pills or capsules to replace enzymes (oral pancreatic enzyme replacement therapy). Feedings through a tube in the stomach or intestine (enteral feedings). What foods should I avoid? Fruits Fried fruits. Fruits served with butter or cream. Vegetables Fried vegetables. Vegetables  cooked with butter, cheese, or cream. Grains Biscuits, waffles, donuts, pastries, and croissants. Pies and cookies. Butter-flavored popcorn. Regular crackers. Meats and other proteins Fatty cuts of meat. Poultry with skin. Organ meats. Precooked or cured meat, such as sausages or meat loaves. Whole eggs. Nuts and nut butters. Dairy Whole and 2% milk. Whole milk yogurt. Whole milk ice cream. Cream and half-and-half. Cheese, such as cream cheese. Sour cream. Beverages Wine, beer, and liquor. The items listed above may not be a complete list of foods and beverages you should avoid. Contact a dietitian for more information. Summary Pancreatitis can affect how your body digests food, especially foods with fat. When you have pancreatitis, it is recommended that you follow a low-fat eating plan to help you recover faster and manage symptoms. Do not drink alcohol. Limit the amount of caffeine you have, and drink enough fluid to keep your urine pale yellow. This information is not intended to replace advice given to you by your health care provider. Make sure you discuss any questions you have with your health care provider. Document Revised: 04/05/2021 Document Reviewed: 04/05/2021 Elsevier Patient Education  2024 [employer]   Please schedule a Follow-up Appointment to: No follow-ups on file.  If you have any other questions or concerns, please feel free to call the office or send a message through MyChart. You may also schedule an earlier appointment if necessary.  Additionally, you may be receiving a survey about your experience at our office within a few days to 1 week by e-mail or mail. We value your feedback.  Saralyn Pilar, DO [facility], [place]

## 2023-05-31 NOTE — Progress Notes (Signed)
Subjective:    Patient ID: Traci Davis, female    DOB: 1970-08-27, 52 y.o.   MRN: [id number]  Traci Davis is a 53 y.o. female presenting on 05/31/2023 for Pancreatitis   HPI  Discussed the use of AI scribe software for clinical note transcription with the patient, who gave verbal consent to proceed.  History of Present Illness   The patient, with pancreatitis and diabetes, presents for follow-up on pancreatitis management.      HOSPITAL FOLLOW-UP VISIT  Hospital/Location: [facility] Date of Admission: 05/22/23 Date of Discharge: 05/25/23 Transitions of care telephone call: 05/28/23 completed TOC Amy Teena Dunk / TOC call unsuccessful 3rd attempt 05/30/23  Reason for Admission: Acute on Chronic Pancreatitis   FOLLOW-UP  - Hospital H&P and Discharge Summary have been reviewed - Patient presents today about 6 days after recent hospitalization. Brief summary of recent course, patient had symptoms of acute abdominal pain and nausea vomiting, hospitalized at [facility], treated for pancreatitis.  Since discharge, she has experienced difficulty eating, tolerating only certain foods such as grilled chicken, noodles, eggs, cheese, and biscuits. She reports nausea and vomiting after consuming red meat, which she now avoids. She is seeking nutritional guidance due to her pancreatitis and diabetes.  She is managing her diet by consuming bland foods like crackers and grilled chicken, while avoiding high-fat and greasy foods. She is concerned about maintaining a suitable diet given her pancreatitis and diabetes.  For pain management, she is under the care of a pain clinic and received Dilaudid and Toradol during her hospital stay. Currently, she is taking Percocet, which she finds ineffective for her pancreatitis pain, and is considering discussing alternative pain management options with her pain clinic doctor.  She has a history of cerebral palsy, which affects her memory, and she sees a Programmer, systems annually. She also reports a bruise on her abdomen from insulin shots received during her hospital stay.   - New medications on discharge: None - Changes to current meds on discharge: none   I have reviewed the discharge medication list, and have reconciled the current and discharge medications today.      02/27/2023    4:52 PM 12/06/2022    3:34 PM 04/05/2022    4:17 PM  Depression screen PHQ 2/9  Decreased Interest 0    Down, Depressed, Hopeless 0    PHQ - 2 Score 0    Altered sleeping     Tired, decreased energy     Change in appetite     Feeling bad or failure about yourself      Trouble concentrating     Moving slowly or fidgety/restless     Suicidal thoughts     PHQ-9 Score        Information is confidential and restricted. Go to Review Flowsheets to unlock data.       12/06/2022    3:34 PM 04/05/2022    4:18 PM 06/06/2021    4:51 PM  GAD 7 : Generalized Anxiety Score  Nervous, Anxious, on Edge     Control/stop worrying     Worry too much - different things     Trouble relaxing     Restless     Easily annoyed or irritable     Afraid - awful might happen     Total GAD 7 Score     Anxiety Difficulty        Information is confidential and restricted. Go to Review Flowsheets to unlock data.  Social History   Tobacco Use   Smoking status: Never   Smokeless tobacco: Never  Vaping Use   Vaping status: Never Used  Substance Use Topics   Alcohol use: No   Drug use: No    Review of Systems Per HPI unless specifically indicated above     Objective:    BP 120/70   Pulse 94   Ht 5\' 2"  (1.575 m)   Wt 201 lb (91.2 kg)   LMP 08/20/2014 Comment: missed cup when trying to obtain urine spec  SpO2 94%   BMI 36.76 kg/m   Wt Readings from Last 3 Encounters:  05/31/23 201 lb (91.2 kg)  05/21/23 201 lb (91.2 kg)  05/13/23 198 lb (89.8 kg)    Physical Exam Vitals and nursing note reviewed.  Constitutional:      General: She is not in acute  distress.    Appearance: Normal appearance. She is well-developed. She is obese. She is not diaphoretic.     Comments: Well-appearing, comfortable, cooperative  HENT:     Head: Normocephalic and atraumatic.  Eyes:     General:        Right eye: No discharge.        Left eye: No discharge.     Conjunctiva/sclera: Conjunctivae normal.  Cardiovascular:     Rate and Rhythm: Normal rate.  Pulmonary:     Effort: Pulmonary effort is normal.  Abdominal:     General: Bowel sounds are normal.     Palpations: There is no mass.     Tenderness: There is no abdominal tenderness. There is no guarding.  Musculoskeletal:     Comments: Left index finger with some radial deviation present distally and some slight inc bulky DIP  Left sided reduced mobility with CP chronic  Rolling walker  Skin:    General: Skin is warm and dry.     Findings: No erythema or rash.  Neurological:     Mental Status: She is alert and oriented to person, place, and time.  Psychiatric:        Mood and Affect: Mood normal.        Behavior: Behavior normal.        Thought Content: Thought content normal.     Comments: Well groomed, good eye contact, normal speech and thoughts     I have personally reviewed the radiology report from 05/22/23 on CT Abdomen Pelvis.  CT Abdomen Pelvis W IV Contrast  Salomon Fick, MD - 05/22/2023 Formatting of this note might be different from the original. EXAM: CT ABDOMEN PELVIS W CONTRAST ACCESSION: [id number] [facility] REPORT DATE: 05/22/2023 10:20 PM CLINICAL INDICATION: 53 years old with LUQ abdominal pain, persist despite pain medications, h/o chronic pancreatitis    COMPARISON: CT abdomen 09/22/2018  TECHNIQUE: A helical CT scan of the abdomen and pelvis was obtained following IV contrast from the lung bases through the pubic symphysis. Images were reconstructed in the axial plane. Coronal and sagittal reformatted images were also provided for further  evaluation.   FINDINGS:  LOWER CHEST: Unremarkable.  LIVER: Normal liver contour. Likely focal fatty infiltration in the anterior aspect of hepatic segment V (2:46)  BILIARY: The gallbladder is surgically absent. Mild intrahepatic and extrahepatic biliary ductal dilation.  SPLEEN: Normal in size and contour.  PANCREAS: Sequela of chronic pancreatitis with fatty parenchyma atrophy of the pancreas. Pancreatic fat stranding most pronounced around the pancreatic body and proximal tail (2:35). No peripancreatic fluid collection. Subcentimeter peripancreatic nodes. No ductal dilation.  ADRENAL GLANDS: Normal appearance of the adrenal glands.  KIDNEYS/URETERS: Symmetric renal enhancement.  No hydronephrosis.  No solid renal mass. Left lower pole renal cyst measuring up to 1.6 cm.  BLADDER: Mild circumferential bladder wall thickening perivesicular stranding.  REPRODUCTIVE ORGANS: Unremarkable.  GI TRACT: No findings of bowel obstruction or acute inflammation. Small duodenal diverticulum in the horizontal portion. Scattered colonic diverticula. Normal appendix.  PERITONEUM, RETROPERITONEUM AND MESENTERY: No free air.  No ascites.  No fluid collection.  LYMPH NODES: No adenopathy.  VESSELS: Hepatic and portal veins are patent. No splenic artery injury. Normal caliber aorta.    BONES and SOFT TISSUES: No aggressive osseous lesions.  No focal soft tissue lesions. Fatty atrophy of the partially imaged left rectus femoris muscle.   IMPRESSION: Acute interstitial pancreatitis. No fluid collections. Exam End: 05/22/23 22:18   Specimen Collected: 05/22/23 22:20 Last Resulted: 05/22/23 22:52  Received From: [facility]  Result Received: 05/27/23 11:20    Results for orders placed or performed in visit on 05/21/23  POCT HgB A1C   Collection Time: 05/21/23  3:25 PM  Result Value Ref Range   Hemoglobin A1C 8.0 (A) 4.0 - 5.6 %   HbA1c POC (<> result, manual entry)     HbA1c, POC  (prediabetic range)     HbA1c, POC (controlled diabetic range)    Sed Rate (ESR)   Collection Time: 05/21/23  3:59 PM  Result Value Ref Range   Sed Rate 28 0 - 30 mm/h  C-reactive protein   Collection Time: 05/21/23  3:59 PM  Result Value Ref Range   CRP 43.9 (H) <8.0 mg/L  Cyclic citrul peptide antibody, IgG   Collection Time: 05/21/23  3:59 PM  Result Value Ref Range   Cyclic Citrullin Peptide Ab <16 UNITS  Rheumatoid Factor   Collection Time: 05/21/23  3:59 PM  Result Value Ref Range   Rheumatoid fact SerPl-aCnc <10 <14 IU/mL  ANA   Collection Time: 05/21/23  3:59 PM  Result Value Ref Range   Anti Nuclear Antibody (ANA) NEGATIVE NEGATIVE      Assessment & Plan:   Problem List Items Addressed This Visit   None Visit Diagnoses       Acute on chronic pancreatitis (HCC)    -  Primary   Relevant Orders   COMPLETE METABOLIC PANEL WITH GFR   CBC with Differential/Platelet     Elevated LFTs       Relevant Orders   COMPLETE METABOLIC PANEL WITH GFR     Iron deficiency anemia, unspecified iron deficiency anemia type       Relevant Orders   CBC with Differential/Platelet         Acute on Chronic Pancreatitis - RESOLVING Recent hospitalization for pancreatitis.  Improved but still currently experiencing difficulty with eating and sensitivity in the abdomen. History of pancreatitis since 2017 with infrequent hospitalizations. -Continue with a pancreatitis diet, focusing on low-fat, easily digestible foods. - Print AVS info -Check kidney, liver, and blood levels today to monitor recovery. Defer Lipase re-check since she has had acute pancreatitis with normal or low lipase in past.  Pain Management Followed by [facility] Current pain management with Percocet is not effective for pancreatitis flare-ups  -Message to Dr. Antony Blackbird regarding the need for a backup plan for pain management during pancreatitis flare-ups. Patient to discuss further with Dr. Vear Clock  at the upcoming appointment on February 12th.   Also note reviewed recent X-ray hand. Showed osteoarthritis DIPs fingers.  No rheumatoid or other joint deformity.   Orders Placed This Encounter  Procedures   COMPLETE METABOLIC PANEL WITH GFR   CBC with Differential/Platelet    No orders of the defined types were placed in this encounter.    Follow up plan: Return if symptoms worsen or fail to improve.   Saralyn Pilar, DO [facility] 05/31/2023, 1:48 PM

## 2023-06-01 LAB — COMPLETE METABOLIC PANEL WITH GFR
AG Ratio: 2 (calc) (ref 1.0–2.5)
ALT: 26 U/L (ref 6–29)
AST: 21 U/L (ref 10–35)
Albumin: 4.5 g/dL (ref 3.6–5.1)
Alkaline phosphatase (APISO): 132 U/L (ref 37–153)
BUN: 12 mg/dL (ref 7–25)
CO2: 27 mmol/L (ref 20–32)
Calcium: 9.1 mg/dL (ref 8.6–10.4)
Chloride: 98 mmol/L (ref 98–110)
Creat: 0.88 mg/dL (ref 0.50–1.03)
Globulin: 2.2 g/dL (ref 1.9–3.7)
Glucose, Bld: 264 mg/dL — ABNORMAL HIGH (ref 65–139)
Potassium: 3.9 mmol/L (ref 3.5–5.3)
Sodium: 139 mmol/L (ref 135–146)
Total Bilirubin: 0.3 mg/dL (ref 0.2–1.2)
Total Protein: 6.7 g/dL (ref 6.1–8.1)
eGFR: 79 mL/min/{1.73_m2} (ref >=60)

## 2023-06-01 LAB — CBC WITH DIFFERENTIAL/PLATELET
Absolute Lymphocytes: 2992 {cells}/uL (ref 850–3900)
Absolute Monocytes: 572 {cells}/uL (ref 200–950)
Basophils Absolute: 66 {cells}/uL (ref 0–200)
Basophils Relative: 0.6 %
Eosinophils Absolute: 407 {cells}/uL (ref 15–500)
Eosinophils Relative: 3.7 %
HCT: 39.9 % (ref 35.0–45.0)
Hemoglobin: 12.8 g/dL (ref 11.7–15.5)
MCH: 26.6 pg — ABNORMAL LOW (ref 27.0–33.0)
MCHC: 32.1 g/dL (ref 32.0–36.0)
MCV: 82.8 fL (ref 80.0–100.0)
MPV: 10.3 fL (ref 7.5–12.5)
Monocytes Relative: 5.2 %
Neutro Abs: 6963 {cells}/uL (ref 1500–7800)
Neutrophils Relative %: 63.3 %
Platelets: 336 10*3/uL (ref 140–400)
RBC: 4.82 10*6/uL (ref 3.80–5.10)
RDW: 14.7 % (ref 11.0–15.0)
Total Lymphocyte: 27.2 %
WBC: 11 10*3/uL — ABNORMAL HIGH (ref 3.8–10.8)

## 2023-06-03 ENCOUNTER — Ambulatory Visit: Payer: 59 | Attending: Physical Medicine and Rehabilitation | Admitting: Physical Therapy

## 2023-06-03 DIAGNOSIS — R262 Difficulty in walking, not elsewhere classified: Secondary | ICD-10-CM | POA: Diagnosis not present

## 2023-06-03 DIAGNOSIS — M25552 Pain in left hip: Secondary | ICD-10-CM | POA: Insufficient documentation

## 2023-06-03 NOTE — Therapy (Signed)
OUTPATIENT PHYSICAL THERAPY PROGRESS  Dates of Reporting: 03/11/23- 06/03/23   Patient Name: Traci Davis MRN: [id number] DOB:1970/11/11, 53 y.o., female Today's Date: 03/11/2023   PCP: Traci Pilar, DO REFERRING PROVIDER: Windle Guard, DO   END OF SESSION:  PT End of Session - 06/03/23 1529     Visit Number 10    Number of Visits 20    Date for PT Re-Evaluation 06/24/23    Authorization Type UHC dual complete; [place] Medicaid- Medical Necessity    Authorization Time Period 04/23/23-06/24/23    Authorization - Visit Number 10    Authorization - Number of Visits 12    Progress Note Due on Visit 10    PT Start Time 1520    PT Stop Time 1600    PT Time Calculation (min) 40 min    Activity Tolerance Patient tolerated treatment well    Behavior During Therapy [facility] for tasks assessed/performed                   Past Medical History:  Diagnosis Date   Acute pancreatitis 09/04/2014   Anxiety    Arthritis    Asthma    Cerebral palsy (HCC)    Complication of anesthesia    panic attacks before surgery   COPD (chronic obstructive pulmonary disease) (HCC)    Depression    Diabetes mellitus without complication (HCC)    GERD (gastroesophageal reflux disease)    Hypertension    Noninfectious gastroenteritis and colitis 01/10/2013    Past Surgical History:  Procedure Laterality Date   CESAREAN SECTION  1995   CHOLECYSTECTOMY     DILATATION & CURETTAGE/HYSTEROSCOPY WITH MYOSURE N/A 02/20/2018   Procedure: DILATATION & CURETTAGE/HYSTEROSCOPY WITH MYOSURE ENDOMETRIAL POLYPECTOMY;  Surgeon: Conard Novak, MD;  Location: [facility] ORS;  Service: Gynecology;  Laterality: N/A;   ESOPHAGOGASTRODUODENOSCOPY (EGD) WITH PROPOFOL N/A 01/01/2019   Procedure: ESOPHAGOGASTRODUODENOSCOPY (EGD) WITH PROPOFOL;  Surgeon: Toney Reil, MD;  Location: [facility] ENDOSCOPY;  Service: Gastroenterology;  Laterality: N/A;   FOOT CAPSULE RELEASE W/ PERCUTANEOUS HEEL CORD LENGTHENING,  TIBIAL TENDON TRANSFER     age 89 ,and 2010-both legs   JOINT REPLACEMENT Right    both knees partial replacements dates unknown   knee replacment     x 5 per pt. last one- left knee 2014. has had 2 on R 3 on L   TYMPANOPLASTY WITH GRAFT Right 01/08/2018   Procedure: TYMPANOPLASTY WITH POSSIBLE OSSICULAR CHAIN RECONSTRUCTION;  Surgeon: Geanie Logan, MD;  Location: [facility] ORS;  Service: ENT;  Laterality: Right;    Patient Active Problem List   Diagnosis Date Noted   Urge urinary incontinence 04/02/2023   Incontinence of feces 04/02/2023   Vaginal atrophy 04/02/2023   Asthma, persistent controlled 02/06/2023   Continuous leakage of urine 01/04/2023   De Quervain's tenosynovitis, right 10/08/2022   Hot flashes due to menopause 07/17/2022   Convulsions (HCC) 07/09/2022   Obesity due to excess calories 09/22/2020   Insomnia due to medical condition 05/11/2020   Hamstring tendonitis 01/29/2020   MDD (major depressive disorder), recurrent, in full remission (HCC) 08/12/2019   Seizure-like activity (HCC) 07/08/2019   Mild episode of recurrent major depressive disorder (HCC) 12/29/2018   Insomnia due to mental condition 12/29/2018   Disorder of vein 03/04/2018   Lumbar sprain 03/04/2018   Muscle weakness 03/04/2018   Neck sprain 03/04/2018   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   Postmenopausal bleeding 02/18/2018   Impingement syndrome of shoulder region 02/04/2018   Chest  pain with moderate risk for cardiac etiology 05/19/2017   GAD (generalized anxiety disorder) 04/15/2017   Chronic daily headache 04/15/2017   Panic attack 04/15/2017   Nocturnal enuresis 04/15/2017   Mild cognitive impairment 04/15/2017   Loss of memory 01/14/2017   Pain medication agreement signed 01/08/2017   Headache disorder 12/04/2016   Chronic, continuous use of opioids 07/01/2015   Vertigo 07/01/2015   Chronic midline low back pain without sciatica 03/21/2015   Type 2 diabetes mellitus with other specified  complication (HCC) 09/05/2014   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   Hip pain, chronic 04/28/2013   Essential hypertension 01/10/2013   Irritable colon 01/10/2013   Encounter for long-term (current) use of other medications 12/04/2012   Chronic GERD 08/12/2012   Diarrhea 08/12/2012   Primary localized osteoarthrosis, lower leg 05/07/2012   Difficulty walking 02/06/2012     ONSET DATE: chronic, congential   REFERRING DIAG: spastic diplegic CP; Rt leg pain; Lt leg pain   THERAPY DIAG:  Difficulty in walking, not elsewhere classified  Rationale for Evaluation and Treatment: Rehabilitation    SUBJECTIVE:                                                                                                                                                                                             SUBJECTIVE STATEMENT: Pt reports that she is feeling increased low back pain and this is typical given that it has been some time since her last joint injections.  PERTINENT HISTORY:  Traci Davis is a 51yoF who is referred to this clinic after a period of immobility and subsequent weakness upon resumption. Pt had a Rt wrist surgery in September and did not feel safe AMB with wrist splint. Pt reports her legs are much weakness now and she is having trouble mobilizing at her baseline. Pt familiar to this clinic from prior episodes.   PAIN:  Are you having pain? Yes, bilat leg pain, 7/10 bilat anterior thighs progressive with walking, resolves with rest/sitting. Pt reports chronic left lower leg pain which has been responsive to gabapentin for several weeks now.     PRECAUTIONS: Wears Left KAFO, Rt knee OA brace; has been released from Rt wrist splint but wears it when mobilizing due to soreness and fear of hitting it on something.  WEIGHT BEARING RESTRICTIONS: WBAT RUE  FALLS: Has patient fallen in last 6 months? 3 falls in past 6 months    PLOF: mod I AMB at baseline; platform  walking in household, sometimes short community distances; power scooter or manual WC for community distance AMB; pt drives  PATIENT GOALS: help  return to her baseline in AMB overground   OBJECTIVE:  Note: Objective measures were completed at Evaluation unless otherwise noted.  LOWER EXTREMITY ROM:    P/ROM  Right Eval Left Eval  Hip flexion    Hip extension    Hip abduction    Hip adduction    Hip internal rotation    Hip external rotation    Knee flexion    Knee extension    Ankle dorsiflexion    Ankle plantarflexion    Ankle inversion    Ankle eversion      LOWER EXTREMITY STRENGTH:    MMT Right Eval Left Eval  Hip flexion    Hip extension    Hip abduction    Hip adduction    Hip internal rotation    Hip external rotation    Knee flexion    Knee extension    Ankle dorsiflexion    Ankle plantarflexion    Ankle inversion    Ankle eversion    (Blank rows = not tested)  BED MOBILITY:  Sleeps in a regular bed, uses a trapeze for repositioning; no adjustment, no bed rails   TRANSFERS: Assistive device utilized: ModI STS form platform walking   RAMP:  Level of Assistance: can typically manage c with WC or with platform walker   CURB:  Level of Assistance: can typically manage c with WC or with platform walker   STAIRS: Level of Assistance: does not AMB stairs at baseline   GAIT: Gait pattern: double platform walker with with Left KAFO, but will occasionally AMB the household without KAFO while in socks.  FUNCTIONAL TESTS:  -3xSTS from chair: hands ad lib, walker ad lib, Left KAFO, Rt knee brace: 19.95sec -161ft AMB: Left AFO, double platform RW; 2 minutes, 5 seconds (0.47m/s)   TODAY'S TREATMENT DATE:   06/03/23: THEREX  MobQoL: (17/28) x 100 = 61% Supine Marches with bent knee 2 x 10 Supine Marches with straight leg  2 x 10  -Only able to flex to 45 deg   Seated HS Stretch on RLE 2 x 30 sec  -min VC to increase forward lean and to place pressure  on knee  Gait Analysis: Increased internal rotation on right hip with decreased step length   05/30/23 Straight Leg Raise on LLE with brace locked into extension 1 x 5 Straight Leg Raise on LLE with brace locked into extension 2 x 8 Bridges with extended knee for HS bias 2 X 10  -Pt reports increased low back tightness.  Sit to Stand from 18 inch chair height 1 x 5  Sit to Stand with 22 inch chair height with back pack with 8 lbs and knee brace locked on LLE 1 x 10  -Pt only loading RLE  Standing Hip Extension on LLE with BUE support and LLE brace locked 1 x 5  -Pt unable to perform without increased toe drag Bent over hip extension on LLE with BUE support and LLE brace locked 1 x 10 -Pt unable to extend hip beyond 5 deg flexion   05/27/23: All single LE exercises performed on LLE  Overground ambulation 100 ft x 2   Seated Block Taps on LLE 1 X 10, 1 x 5  Standing toe taps with LLE on 10 lb plate 1 x 10  Mini-Squat with use of BUE support with 2WW 2 x 10  -min VC to increase posterior weight shift Standing Marches with BUE support non-alternating 2 x 10  Sit to Stand  from elevated surface with 25 inch height with brace locked out 1 x 10   05/20/23: All single LE exercises performed on RLE  Nu-Step Seat and Arms at 8 for 5 min  Sit to Stand 1 x 5 from 23 inch  Seated Long Arch Quad on RLE 1 x 10  HS Stretch on RLE 1  Seated Long Arch Quad on RLE #3 AW 1 x 10  Seated HS and Calf Stretch 2 x 30 sec  Standing Marches with RLE with BUE support 1 x 10    04/18/23: Seated Hip Flexion Step Over yoga block  200 Amb: 61 meters in 135 sec= 0.45 m/sec   ModQoL7 Score=71%  5x STS: 22 sec    Straight Leg Raise on LLE 3 x 5  -min VC to tight stomach to decrease low back arching   Modified HEP to include focus on remaining deficits.   PATIENT EDUCATION: Education details: Patient  Person educated: plan for assessment this date and how to generate goals for improvement Education  method: Discussion Education comprehension: good   HOME EXERCISE PROGRAM: Access Code: [email or web address] URL: [email or web address] Date: 06/03/2023 Prepared by: Ellin Goodie  Exercises - Supine Lower Trunk Rotation  - 3-4 x weekly - 2 sets - 10 reps - 10 sec  hold - Seated Hamstring Stretch  - 1 x daily - 3 reps - 30-60 sec  hold - Seated Hip Abduction with Resistance  - 3-4 x weekly - 3 sets - 10 reps - Standing Forward Step Taps with Counter Support  - 3-4 x weekly - 3 sets - 10 reps - Supine Active Straight Leg Raise  - 3-4 x weekly - 3 sets - 5 reps  GOALS: Goals reviewed with patient? Yes  SHORT TERM GOALS: Target date: 03/31/23  Pt to perform 3xSTS in <16 sec  Baseline: eval: 19.95sec  12/10: 14.55 sec   Goal status: MET  2.  Pt to report regular performance of starter HEP at home and readiness/confidence to progress to additional standing activity.  Baseline: issued at eval, not regularly performing prior.  04/18/23: MET- performing independently  Goal status: MET   3.  Pt to demonstrate 164ft AMB in clinic in <1 minute 40 seconds.  Baseline: eval: 47m5s   12/10: 18m 15s   Goal status: MET   LONG TERM GOALS: Target date: 04/22/23  Pt to perform 5xSTS in <19.95 sec  Baseline: eval: 3xSTS in 19.95sec 12/10: 3xSTS 14.55sec 04/18/23: 5 x STS 22.88 sec 06/03/23: 17.85 sec  Goal status: ACHIEVED   2.  Pt to demonstrate tolerance of 236ft AMB without rest break, average speed faster than 0.85m/s Baseline: 0.29 m/s   04/18/23: 0.45 m/sec  Goal status: ACHIEVED   3.  Pt to improve score on MobQoL report measure by >20% to indicate improved mobility-specific quality of life.  Baseline: 03/11/23: 0.54  04/18/23: 0.71 % 06/03/23: 61%  Goal status: Partially Met   4. Patient will improve her RLE stride length to be nearly symmetrical to LLE for improved gait mechanics and energy efficiency while walking to ambulate longer distances for improved mobility and ambulator  status.  Baseline: LLE heel clears past toe of RLE, RLE heel is behind toe of LLE indicating decreased step length. Goal status: Baseline    ASSESSMENT:  CLINICAL IMPRESSION: Pt continues to progress towards goals with improvement in LE strength and decrease in risk of falling. Despite now meeting most of her goals, her perception of her mobility and quality has  decreased as evidenced by a decrease in the score of her MobQoL-7D. Pt's most significant concern is her difficulty in progressing her RLE due to internal rotation of her right hip due to spasticity of hip adductors from cerebral palsy.  She will continue to benefit from skilled therapy to address deficits in order to improve overall QoL, perform ADLs with increased function, and ambulate with less energy expenditure.    OBJECTIVE IMPAIRMENTS: Abnormal gait, cardiopulmonary status limiting activity, decreased activity tolerance, decreased balance, decreased coordination, decreased endurance, decreased mobility, difficulty walking, decreased ROM, and decreased strength.   ACTIVITY LIMITATIONS: standing, squatting, stairs, transfers, bed mobility, and bathing  PARTICIPATION LIMITATIONS: cleaning, medication management, interpersonal relationship, driving, shopping, and community activity  PERSONAL FACTORS: Age, Behavior pattern, Education, and Past/current experiences are also affecting patient's functional outcome.   REHAB POTENTIAL: Good  CLINICAL DECISION MAKING: Stable/uncomplicated  EVALUATION COMPLEXITY: Moderate  PLAN:  PT FREQUENCY: 1-2x/week  PT DURATION: 6 weeks  PLANNED INTERVENTIONS: 97110-Therapeutic exercises, 97530- Therapeutic activity, [id number]- Neuromuscular re-education, 97535- Self Care, 16109- Manual therapy, [id number]- Subsequent splinting/medication, 97014- Electrical stimulation (unattended), [id number]- Electrical stimulation (manual), 60454- Traction (mechanical), Patient/Family education, and Balance  training  PLAN FOR NEXT SESSION: Look at improving right hip ROM and gait training.   Ellin Goodie PT, DPT  [facility] [address], [place], [address] Phone: [phone]   Fax:  [phone]

## 2023-06-04 ENCOUNTER — Encounter: Payer: Self-pay | Admitting: Family Medicine

## 2023-06-05 ENCOUNTER — Ambulatory Visit: Payer: 59 | Admitting: Physical Therapy

## 2023-06-07 ENCOUNTER — Other Ambulatory Visit: Payer: Self-pay | Admitting: Psychiatry

## 2023-06-07 DIAGNOSIS — F411 Generalized anxiety disorder: Secondary | ICD-10-CM

## 2023-06-07 DIAGNOSIS — F3342 Major depressive disorder, recurrent, in full remission: Secondary | ICD-10-CM

## 2023-06-10 MED ORDER — FLUOXETINE HCL 20 MG PO CAPS: 20.0000 mg | ORAL_CAPSULE | Freq: Every day | ORAL | 1 refills | Status: DC

## 2023-06-10 NOTE — Telephone Encounter (Signed)
 I have sent fluoxetine  and BuSpar  to pharmacy as requested.

## 2023-06-12 DIAGNOSIS — M542 Cervicalgia: Secondary | ICD-10-CM | POA: Diagnosis not present

## 2023-06-12 DIAGNOSIS — M7918 Myalgia, other site: Secondary | ICD-10-CM | POA: Diagnosis not present

## 2023-06-12 DIAGNOSIS — M51369 Other intervertebral disc degeneration, lumbar region without mention of lumbar back pain or lower extremity pain: Secondary | ICD-10-CM | POA: Diagnosis not present

## 2023-06-13 DIAGNOSIS — M47812 Spondylosis without myelopathy or radiculopathy, cervical region: Secondary | ICD-10-CM | POA: Diagnosis not present

## 2023-06-13 DIAGNOSIS — M4802 Spinal stenosis, cervical region: Secondary | ICD-10-CM | POA: Diagnosis not present

## 2023-06-15 ENCOUNTER — Other Ambulatory Visit: Payer: Self-pay | Admitting: Psychiatry

## 2023-06-15 DIAGNOSIS — F3342 Major depressive disorder, recurrent, in full remission: Secondary | ICD-10-CM

## 2023-06-15 DIAGNOSIS — F411 Generalized anxiety disorder: Secondary | ICD-10-CM

## 2023-06-16 DIAGNOSIS — K8500 Idiopathic acute pancreatitis without necrosis or infection: Secondary | ICD-10-CM | POA: Diagnosis not present

## 2023-06-17 ENCOUNTER — Telehealth: Payer: Self-pay

## 2023-06-17 DIAGNOSIS — K760 Fatty (change of) liver, not elsewhere classified: Secondary | ICD-10-CM

## 2023-06-17 DIAGNOSIS — K8689 Other specified diseases of pancreas: Secondary | ICD-10-CM

## 2023-06-17 DIAGNOSIS — R112 Nausea with vomiting, unspecified: Secondary | ICD-10-CM

## 2023-06-17 NOTE — Telephone Encounter (Signed)
Pt contacted office stated that she has been unable to eat since 05/25/23 since her pancreatitis attack.  She stated that she is having pancreas pain.  Informed her that I would let Dr. Allegra Lai know and see what she has advised.  Thanks,  [place], [place]

## 2023-06-18 ENCOUNTER — Ambulatory Visit: Payer: 59 | Admitting: Physical Therapy

## 2023-06-18 DIAGNOSIS — M25552 Pain in left hip: Secondary | ICD-10-CM

## 2023-06-18 DIAGNOSIS — R262 Difficulty in walking, not elsewhere classified: Secondary | ICD-10-CM

## 2023-06-18 NOTE — Telephone Encounter (Signed)
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 Not sure if she is still taking creon  RV

## 2023-06-18 NOTE — Addendum Note (Signed)
Addended by: Radene Knee L on: 06/18/2023 08:21 AM   Modules accepted: Orders

## 2023-06-18 NOTE — Therapy (Signed)
OUTPATIENT PHYSICAL THERAPY TREATMENT/Re-certification    Patient Name: Traci Davis MRN: [id number] DOB:1971/04/01, 53 y.o., female Today's Date: 03/11/2023   PCP: Traci Pilar, DO REFERRING PROVIDER: Windle Guard, DO   END OF SESSION:  PT End of Session - 06/18/23 1819     Visit Number 11    Number of Visits 20    Date for PT Re-Evaluation 06/24/23    Authorization Type [employer] dual complete; Traci Davis- Medical Necessity    Authorization Time Period 04/23/23-06/24/23    Authorization - Visit Number 11    Authorization - Number of Visits 12    Progress Note Due on Visit 12    PT Start Time 1645    PT Stop Time 1730    PT Time Calculation (min) 45 min    Activity Tolerance Patient tolerated treatment well    Behavior During Therapy [facility] for tasks assessed/performed                    Past Medical History:  Diagnosis Date   Acute pancreatitis 09/04/2014   Anxiety    Arthritis    Asthma    Cerebral palsy (HCC)    Complication of anesthesia    panic attacks before surgery   COPD (chronic obstructive pulmonary disease) (HCC)    Depression    Diabetes mellitus without complication (HCC)    GERD (gastroesophageal reflux disease)    Hypertension    Noninfectious gastroenteritis and colitis 01/10/2013    Past Surgical History:  Procedure Laterality Date   CESAREAN SECTION  1995   CHOLECYSTECTOMY     DILATATION & CURETTAGE/HYSTEROSCOPY WITH MYOSURE N/A 02/20/2018   Procedure: DILATATION & CURETTAGE/HYSTEROSCOPY WITH MYOSURE ENDOMETRIAL POLYPECTOMY;  Surgeon: Conard Novak, MD;  Location: [facility] ORS;  Service: Gynecology;  Laterality: N/A;   ESOPHAGOGASTRODUODENOSCOPY (EGD) WITH PROPOFOL N/A 01/01/2019   Procedure: ESOPHAGOGASTRODUODENOSCOPY (EGD) WITH PROPOFOL;  Surgeon: Toney Reil, MD;  Location: [facility] ENDOSCOPY;  Service: Gastroenterology;  Laterality: N/A;   FOOT CAPSULE RELEASE W/ PERCUTANEOUS HEEL CORD LENGTHENING, TIBIAL TENDON  TRANSFER     age 72 ,and 2010-both legs   JOINT REPLACEMENT Right    both knees partial replacements dates unknown   knee replacment     x 5 per pt. last one- left knee 2014. has had 2 on R 3 on L   TYMPANOPLASTY WITH GRAFT Right 01/08/2018   Procedure: TYMPANOPLASTY WITH POSSIBLE OSSICULAR CHAIN RECONSTRUCTION;  Surgeon: Geanie Logan, MD;  Location: [facility] ORS;  Service: ENT;  Laterality: Right;    Patient Active Problem List   Diagnosis Date Noted   Urge urinary incontinence 04/02/2023   Incontinence of feces 04/02/2023   Vaginal atrophy 04/02/2023   Asthma, persistent controlled 02/06/2023   Continuous leakage of urine 01/04/2023   De Quervain's tenosynovitis, right 10/08/2022   Hot flashes due to menopause 07/17/2022   Convulsions (HCC) 07/09/2022   Obesity due to excess calories 09/22/2020   Insomnia due to medical condition 05/11/2020   Hamstring tendonitis 01/29/2020   MDD (major depressive disorder), recurrent, in full remission (HCC) 08/12/2019   Seizure-like activity (HCC) 07/08/2019   Mild episode of recurrent major depressive disorder (HCC) 12/29/2018   Insomnia due to mental condition 12/29/2018   Disorder of vein 03/04/2018   Lumbar sprain 03/04/2018   Muscle weakness 03/04/2018   Neck sprain 03/04/2018   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   Postmenopausal bleeding 02/18/2018   Impingement syndrome of shoulder region 02/04/2018   Chest pain with moderate risk  for cardiac etiology 05/19/2017   GAD (generalized anxiety disorder) 04/15/2017   Chronic daily headache 04/15/2017   Panic attack 04/15/2017   Nocturnal enuresis 04/15/2017   Mild cognitive impairment 04/15/2017   Loss of memory 01/14/2017   Pain medication agreement signed 01/08/2017   Headache disorder 12/04/2016   Chronic, continuous use of opioids 07/01/2015   Vertigo 07/01/2015   Chronic midline low back pain without sciatica 03/21/2015   Type 2 diabetes mellitus with other specified complication (HCC)  09/05/2014   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   Hip pain, chronic 04/28/2013   Essential hypertension 01/10/2013   Irritable colon 01/10/2013   Encounter for long-term (current) use of other medications 12/04/2012   Chronic GERD 08/12/2012   Diarrhea 08/12/2012   Primary localized osteoarthrosis, lower leg 05/07/2012   Difficulty walking 02/06/2012     ONSET DATE: chronic, congential   REFERRING DIAG: spastic diplegic CP; Rt leg pain; Lt leg pain   THERAPY DIAG:  Difficulty in walking, not elsewhere classified  Rationale for Evaluation and Treatment: Rehabilitation    SUBJECTIVE:                                                                                                                                                                                             SUBJECTIVE STATEMENT: Pt states that she is feeling increased pain in her legs even with taking muscle relaxors. She has requested changes to her meds for her physician, but she is still awaiting a response. She is just getting over flu and ongoing pancreatis issues.    PERTINENT HISTORY:  Traci Davis is a 51yoF who is referred to this clinic after a period of immobility and subsequent weakness upon resumption. Pt had a Rt wrist surgery in September and did not feel safe AMB with wrist splint. Pt reports her legs are much weakness now and she is having trouble mobilizing at her baseline. Pt familiar to this clinic from prior episodes.   PAIN:  Are you having pain? Yes, bilat leg pain, 7/10 bilat anterior thighs progressive with walking, resolves with rest/sitting. Pt reports chronic left lower leg pain which has been responsive to gabapentin for several weeks now.     PRECAUTIONS: Wears Left KAFO, Rt knee OA brace; has been released from Rt wrist splint but wears it when mobilizing due to soreness and fear of hitting it on something.  WEIGHT BEARING RESTRICTIONS: WBAT RUE  FALLS: Has patient fallen  in last 6 months? 3 falls in past 6 months    PLOF: mod I AMB at baseline; platform walking in household, sometimes short community distances;  power scooter or manual WC for community distance AMB; pt drives  PATIENT GOALS: help return to her baseline in AMB overground   OBJECTIVE:  Note: Objective measures were completed at Evaluation unless otherwise noted.  LOWER EXTREMITY ROM:    P/ROM  Right Eval Left Eval  Hip flexion    Hip extension    Hip abduction    Hip adduction    Hip internal rotation    Hip external rotation    Knee flexion    Knee extension    Ankle dorsiflexion    Ankle plantarflexion    Ankle inversion    Ankle eversion      LOWER EXTREMITY STRENGTH:    MMT Right Eval Left Eval  Hip flexion    Hip extension    Hip abduction    Hip adduction    Hip internal rotation    Hip external rotation    Knee flexion    Knee extension    Ankle dorsiflexion    Ankle plantarflexion    Ankle inversion    Ankle eversion    (Blank rows = not tested)  BED MOBILITY:  Sleeps in a regular bed, uses a trapeze for repositioning; no adjustment, no bed rails   TRANSFERS: Assistive device utilized: ModI STS form platform walking   RAMP:  Level of Assistance: can typically manage c with WC or with platform walker   CURB:  Level of Assistance: can typically manage c with WC or with platform walker   STAIRS: Level of Assistance: does not AMB stairs at baseline   GAIT: Gait pattern: double platform walker with with Left KAFO, but will occasionally AMB the household without KAFO while in socks.  FUNCTIONAL TESTS:  -3xSTS from chair: hands ad lib, walker ad lib, Left KAFO, Rt knee brace: 19.95sec -171ft AMB: Left AFO, double platform RW; 2 minutes, 5 seconds (0.20m/s)   TODAY'S TREATMENT DATE:   06/18/23: All single RLE  THEREX  Supine Hip ER Figure 4 Stretch 3 x 30 sec  Supine Hip and Knee Extension with blue band 2 x 10  -tactile cue to increase left  quad activation  Supine LLE leg lift 2 x 10 Supine Bridges 2 x 10  Supine Hip Adduction Stretch with added pillows beneath knees and #5 AW for 4 min     06/03/23: THEREX  MobQoL: (17/28) x 100 = 61% Supine Marches with bent knee 2 x 10 Supine Marches with straight leg  2 x 10  -Only able to flex to 45 deg   Seated HS Stretch on RLE 2 x 30 sec  -min VC to increase forward lean and to place pressure on knee  Gait Analysis: Increased internal rotation on right hip with decreased step length   05/30/23 Straight Leg Raise on LLE with brace locked into extension 1 x 5 Straight Leg Raise on LLE with brace locked into extension 2 x 8 Bridges with extended knee for HS bias 2 X 10  -Pt reports increased low back tightness.  Sit to Stand from 18 inch chair height 1 x 5  Sit to Stand with 22 inch chair height with back pack with 8 lbs and knee brace locked on LLE 1 x 10  -Pt only loading RLE  Standing Hip Extension on LLE with BUE support and LLE brace locked 1 x 5  -Pt unable to perform without increased toe drag Bent over hip extension on LLE with BUE support and LLE brace locked 1 x 10 -  Pt unable to extend hip beyond 5 deg flexion   05/27/23: All single LE exercises performed on LLE  Overground ambulation 100 ft x 2   Seated Block Taps on LLE 1 X 10, 1 x 5  Standing toe taps with LLE on 10 lb plate 1 x 10  Mini-Squat with use of BUE support with 2WW 2 x 10  -min VC to increase posterior weight shift Standing Marches with BUE support non-alternating 2 x 10  Sit to Stand from elevated surface with 25 inch height with brace locked out 1 x 10   05/20/23: All single LE exercises performed on RLE  Nu-Step Seat and Arms at 8 for 5 min  Sit to Stand 1 x 5 from 23 inch  Seated Long Arch Quad on RLE 1 x 10  HS Stretch on RLE 1  Seated Long Arch Quad on RLE #3 AW 1 x 10  Seated HS and Calf Stretch 2 x 30 sec  Standing Marches with RLE with BUE support 1 x 10    04/18/23: Seated Hip Flexion  Step Over yoga block  200 Amb: 61 meters in 135 sec= 0.45 m/sec   ModQoL7 Score=71%  5x STS: 22 sec    Straight Leg Raise on LLE 3 x 5  -min VC to tight stomach to decrease low back arching   Modified HEP to include focus on remaining deficits.   PATIENT EDUCATION: Education details: Patient  Person educated: plan for assessment this date and how to generate goals for improvement Education method: Discussion Education comprehension: good   HOME EXERCISE PROGRAM: Access Code: [email or web address] URL: [email or web address] Date: 06/18/2023 Prepared by: Ellin Goodie  Exercises - Supine Lower Trunk Rotation  - 3-4 x weekly - 2 sets - 10 reps - 10 sec  hold - Supine Hip Adductor Stretch  - 1 x daily - 3 reps - 60 sec hold - Seated Hamstring Stretch  - 1 x daily - 3 reps - 30-60 sec  hold - Seated Hip Abduction with Resistance  - 3-4 x weekly - 3 sets - 10 reps - Standing Forward Step Taps with Counter Support  - 3-4 x weekly - 3 sets - 10 reps - Supine Active Straight Leg Raise  - 3-4 x weekly - 3 sets - 10 reps - Supine Bridge  - 3-4 x weekly - 3 sets - 10 reps - Supine Knee and Hip Extension with Resistance  - 3-4 x weekly - 3 sets - 10 reps GOALS: Goals reviewed with patient? Yes  SHORT TERM GOALS: Target date: 03/31/23  Pt to perform 3xSTS in <16 sec  Baseline: eval: 19.95sec  12/10: 14.55 sec   Goal status: MET  2.  Pt to report regular performance of starter HEP at home and readiness/confidence to progress to additional standing activity.  Baseline: issued at eval, not regularly performing prior.  04/18/23: MET- performing independently  Goal status: MET   3.  Pt to demonstrate 153ft AMB in clinic in <1 minute 40 seconds.  Baseline: eval: 35m5s   12/10: 66m 15s   Goal status: MET   LONG TERM GOALS: Target date: 04/22/23  Pt to perform 5xSTS in <19.95 sec  Baseline: eval: 3xSTS in 19.95sec 12/10: 3xSTS 14.55sec 04/18/23: 5 x STS 22.88 sec 06/03/23: 17.85 sec   Goal status: ACHIEVED   2.  Pt to demonstrate tolerance of 2101ft AMB without rest break, average speed faster than 0.16m/s Baseline: 0.29 m/s   04/18/23: 0.45 m/sec  Goal status:  ACHIEVED   3.  Pt to improve score on MobQoL report measure by >20% to indicate improved mobility-specific quality of life.  Baseline: 03/11/23: 0.54  04/18/23: 0.71 % 06/03/23: 61%  Goal status: Partially Met   4. Patient will improve her RLE stride length to be nearly symmetrical to LLE for improved gait mechanics and energy efficiency while walking to ambulate longer distances for improved mobility and ambulator status.  Baseline: LLE heel clears past toe of RLE, RLE heel is behind toe of LLE indicating decreased step length. Goal status: Baseline    ASSESSMENT:  CLINICAL IMPRESSION: Session limited to supine position due to pt initial presentation with increased tiredness from recovery from ongoing illness. Pt continues to experience in creased pain in low back, but this did not limit her from performing any of the exercises. She did experience relief of groin pain when performing hip adductor stretch. PT able to elicit increased left quad activation with tactile cueing with knee and hip extension. Pt also shows improved LLE muscle activation through irradIation with supine bridges. She will continue to benefit from skilled therapy to address deficits in order to improve overall QoL, perform ADLs with increased function, and ambulate with less energy expenditure.    OBJECTIVE IMPAIRMENTS: Abnormal gait, cardiopulmonary status limiting activity, decreased activity tolerance, decreased balance, decreased coordination, decreased endurance, decreased mobility, difficulty walking, decreased ROM, and decreased strength.   ACTIVITY LIMITATIONS: standing, squatting, stairs, transfers, bed mobility, and bathing  PARTICIPATION LIMITATIONS: cleaning, medication management, interpersonal relationship, driving, shopping, and  community activity  PERSONAL FACTORS: Age, Behavior pattern, Education, and Past/current experiences are also affecting patient's functional outcome.   REHAB POTENTIAL: Good  CLINICAL DECISION MAKING: Stable/uncomplicated  EVALUATION COMPLEXITY: Moderate  PLAN:  PT FREQUENCY: 1-2x/week  PT DURATION: 6 weeks  PLANNED INTERVENTIONS: 97110-Therapeutic exercises, 97530- Therapeutic activity, [id number]- Neuromuscular re-education, 97535- Self Care, 16109- Manual therapy, [id number]- Subsequent splinting/medication, 97014- Electrical stimulation (unattended), [id number]- Electrical stimulation (manual), 60454- Traction (mechanical), Patient/Family education, and Balance training  PLAN FOR NEXT SESSION: Transition to standing activity with marches and obstacle step over.    Ellin Goodie PT, DPT  [facility] [address] [place], [address] Phone: [phone]   Fax:  [phone]

## 2023-06-18 NOTE — Telephone Encounter (Signed)
 Called and left a message.

## 2023-06-19 ENCOUNTER — Other Ambulatory Visit: Payer: Self-pay

## 2023-06-19 MED ORDER — OMEPRAZOLE 40 MG PO CPDR: 40.0000 mg | DELAYED_RELEASE_CAPSULE | Freq: Two times a day (BID) | ORAL | 3 refills | Status: DC

## 2023-06-19 NOTE — Telephone Encounter (Signed)
Sent mychart message informing patient we have been trying to reach her

## 2023-06-19 NOTE — Telephone Encounter (Signed)
 Called and left a message for call back

## 2023-06-19 NOTE — Telephone Encounter (Signed)
Patient called back and states she verbalized understanding of instructions. She states she will go to the lab as soon as she can. 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

## 2023-06-25 ENCOUNTER — Ambulatory Visit: Payer: 59 | Admitting: Physical Therapy

## 2023-06-25 DIAGNOSIS — M25552 Pain in left hip: Secondary | ICD-10-CM

## 2023-06-25 DIAGNOSIS — R262 Difficulty in walking, not elsewhere classified: Secondary | ICD-10-CM

## 2023-06-25 NOTE — Therapy (Signed)
 OUTPATIENT PHYSICAL THERAPY TREATMENT Dates of Reporting: 03/10/24-06/25/23  Patient Name: Traci Davis MRN: [id number] DOB:[birth year], 53 y.o., female Today's Date: 03/11/2023   PCP: Traci Pilar, DO REFERRING PROVIDER: Windle Guard, DO   END OF SESSION:  PT End of Session - 06/25/23 1615     Visit Number 12    Number of Visits 20    Date for PT Re-Evaluation 09/22/23    Authorization Type UHC dual complete; [place] [employer]- Medical Necessity    Authorization Time Period 06/25/23-09/22/23    Authorization - Number of Visits 12    Progress Note Due on Visit 12    Activity Tolerance Patient tolerated treatment well    Behavior During Therapy [facility] for tasks assessed/performed                    Past Medical History:  Diagnosis Date   Acute pancreatitis 09/04/2014   Anxiety    Arthritis    Asthma    Cerebral palsy (HCC)    Complication of anesthesia    panic attacks before surgery   COPD (chronic obstructive pulmonary disease) (HCC)    Depression    Diabetes mellitus without complication (HCC)    GERD (gastroesophageal reflux disease)    Hypertension    Noninfectious gastroenteritis and colitis 01/10/2013    Past Surgical History:  Procedure Laterality Date   CESAREAN SECTION  1995   CHOLECYSTECTOMY     DILATATION & CURETTAGE/HYSTEROSCOPY WITH MYOSURE N/A 02/20/2018   Procedure: DILATATION & CURETTAGE/HYSTEROSCOPY WITH MYOSURE ENDOMETRIAL POLYPECTOMY;  Surgeon: Conard Novak, MD;  Location: [facility];  Service: Gynecology;  Laterality: N/A;   ESOPHAGOGASTRODUODENOSCOPY (EGD) WITH PROPOFOL N/A 01/01/2019   Procedure: ESOPHAGOGASTRODUODENOSCOPY (EGD) WITH PROPOFOL;  Surgeon: Toney Reil, MD;  Location: [facility];  Service: Gastroenterology;  Laterality: N/A;   FOOT CAPSULE RELEASE W/ PERCUTANEOUS HEEL CORD LENGTHENING, TIBIAL TENDON TRANSFER     age 44 ,and 2010-both legs   JOINT REPLACEMENT Right    both knees partial replacements dates  unknown   knee replacment     x 5 per pt. last one- left knee 2014. has had 2 on R 3 on L   TYMPANOPLASTY WITH GRAFT Right 01/08/2018   Procedure: TYMPANOPLASTY WITH POSSIBLE OSSICULAR CHAIN RECONSTRUCTION;  Surgeon: Geanie Logan, MD;  Location: [facility] OR[facility];  Service: ENT;  Laterality: Right;    Patient Active Problem List   Diagnosis Date Noted   Urge urinary incontinence 04/02/2023   Incontinence of feces 04/02/2023   Vaginal atrophy 04/02/2023   Asthma, persistent controlled 02/06/2023   Continuous leakage of urine 01/04/2023   De Quervain's tenosynovitis, right 10/08/2022   Hot flashes due to menopause 07/17/2022   Convulsions (HCC) 07/09/2022   Obesity due to excess calories 09/22/2020   Insomnia due to medical condition 05/11/2020   Hamstring tendonitis 01/29/2020   MDD (major depressive disorder), recurrent, in full remission (HCC) 08/12/2019   Seizure-like activity (HCC) 07/08/2019   Mild episode of recurrent major depressive disorder (HCC) 12/29/2018   Insomnia due to mental condition 12/29/2018   Disorder of vein 03/04/2018   Lumbar sprain 03/04/2018   Muscle weakness 03/04/2018   Neck sprain 03/04/2018   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   Postmenopausal bleeding 02/18/2018   Impingement syndrome of shoulder region 02/04/2018   Chest pain with moderate risk for cardiac etiology 05/19/2017   GAD (generalized anxiety disorder) 04/15/2017   Chronic daily headache 04/15/2017   Panic attack 04/15/2017   Nocturnal enuresis 04/15/2017  Mild cognitive impairment 04/15/2017   Loss of memory 01/14/2017   Pain medication agreement signed 01/08/2017   Headache disorder 12/04/2016   Chronic, continuous use of opioids 07/01/2015   Vertigo 07/01/2015   Chronic midline low back pain without sciatica 03/21/2015   Type 2 diabetes mellitus with other specified complication (HCC) 09/05/2014   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   Hip pain, chronic 04/28/2013   Essential  hypertension 01/10/2013   Irritable colon 01/10/2013   Encounter for long-term (current) use of other medications 12/04/2012   Chronic GERD 08/12/2012   Diarrhea 08/12/2012   Primary localized osteoarthrosis, lower leg 05/07/2012   Difficulty walking 02/06/2012     ONSET DATE: chronic, congential   REFERRING DIAG: spastic diplegic CP; Rt leg pain; Lt leg pain   THERAPY DIAG:  Difficulty in walking, not elsewhere classified  Rationale for Evaluation and Treatment: Rehabilitation    SUBJECTIVE:                                                                                                                                                                                             SUBJECTIVE STATEMENT: Pt reports increased low back pain and she is awaiting for her physician to change her muscle relaxors because the ones she has currently is not working.   PERTINENT HISTORY:  Traci Davis is a 51yoF who is referred to this clinic after a period of immobility and subsequent weakness upon resumption. Pt had a Rt wrist surgery in September and did not feel safe AMB with wrist splint. Pt reports her legs are much weakness now and she is having trouble mobilizing at her baseline. Pt familiar to this clinic from prior episodes.   PAIN:  Are you having pain? Yes, 7/10 low back    PRECAUTIONS: Wears Left KAFO, Rt knee OA brace; has been released from Rt wrist splint but wears it when mobilizing due to soreness and fear of hitting it on something.  WEIGHT BEARING RESTRICTIONS: WBAT RUE  FALLS: Has patient fallen in last 6 months? 3 falls in past 6 months    PLOF: mod I AMB at baseline; platform walking in household, sometimes short community distances; power scooter or manual WC for community distance AMB; pt drives  PATIENT GOALS: help return to her baseline in AMB overground   OBJECTIVE:  Note: Objective measures were completed at Evaluation unless otherwise noted.  LOWER EXTREMITY  ROM:    P/ROM  Right Eval Left Eval  Hip flexion    Hip extension    Hip abduction    Hip adduction    Hip internal rotation  Hip external rotation    Knee flexion    Knee extension    Ankle dorsiflexion    Ankle plantarflexion    Ankle inversion    Ankle eversion      LOWER EXTREMITY STRENGTH:    MMT Right Eval Left Eval  Hip flexion    Hip extension    Hip abduction    Hip adduction    Hip internal rotation    Hip external rotation    Knee flexion    Knee extension    Ankle dorsiflexion    Ankle plantarflexion    Ankle inversion    Ankle eversion    (Blank rows = not tested)  BED MOBILITY:  Sleeps in a regular bed, uses a trapeze for repositioning; no adjustment, no bed rails   TRANSFERS: Assistive device utilized: ModI STS form platform walking   RAMP:  Level of Assistance: can typically manage c with WC or with platform walker   CURB:  Level of Assistance: can typically manage c with WC or with platform walker   STAIRS: Level of Assistance: does not AMB stairs at baseline   GAIT: Gait pattern: double platform walker with with Left KAFO, but will occasionally AMB the household without KAFO while in socks.  FUNCTIONAL TESTS:  -3xSTS from chair: hands ad lib, walker ad lib, Left KAFO, Rt knee brace: 19.95sec -167ft AMB: Left AFO, double platform RW; 2 minutes, 5 seconds (0.3m/s)   TODAY'S TREATMENT DATE:    06/25/23: GAIT TRAINING  Standing Marches with RLE use of BUE support using walker 2 x 10  Standing Step Overs with RLE and use of BUE support using walker 2 x 10  -obstacle is a red ankle weight  Standing Marches with LLE use of BUE support using walker 2 x 10   Gait Training  20 ft x 20 with emphasis on increased step length.  Mod VC to place heel of swing foot past toes of stance foot.  -Pt shows increased step length on LLE compared to RLE    06/18/23: All single RLE  THEREX  Supine Hip ER Figure 4 Stretch 3 x 30 sec  Supine Hip  and Knee Extension with blue band 2 x 10  -tactile cue to increase left quad activation  Supine LLE leg lift 2 x 10 Supine Bridges 2 x 10  Supine Hip Adduction Stretch with added pillows beneath knees and #5 AW for 4 min     06/03/23: THEREX  MobQoL: (17/28) x 100 = 61% Supine Marches with bent knee 2 x 10 Supine Marches with straight leg  2 x 10  -Only able to flex to 45 deg   Seated HS Stretch on RLE 2 x 30 sec  -min VC to increase forward lean and to place pressure on knee  Gait Analysis: Increased internal rotation on right hip with decreased step length   05/30/23 Straight Leg Raise on LLE with brace locked into extension 1 x 5 Straight Leg Raise on LLE with brace locked into extension 2 x 8 Bridges with extended knee for HS bias 2 X 10  -Pt reports increased low back tightness.  Sit to Stand from 18 inch chair height 1 x 5  Sit to Stand with 22 inch chair height with back pack with 8 lbs and knee brace locked on LLE 1 x 10  -Pt only loading RLE  Standing Hip Extension on LLE with BUE support and LLE brace locked 1 x 5  -Pt unable  to perform without increased toe drag Bent over hip extension on LLE with BUE support and LLE brace locked 1 x 10 -Pt unable to extend hip beyond 5 deg flexion   05/27/23: All single LE exercises performed on LLE  Overground ambulation 100 ft x 2   Seated Block Taps on LLE 1 X 10, 1 x 5  Standing toe taps with LLE on 10 lb plate 1 x 10  Mini-Squat with use of BUE support with 2WW 2 x 10  -min VC to increase posterior weight shift Standing Marches with BUE support non-alternating 2 x 10  Sit to Stand from elevated surface with 25 inch height with brace locked out 1 x 10   PATIENT EDUCATION: Education details: Patient  Person educated: plan for assessment this date and how to generate goals for improvement Education method: Discussion Education comprehension: good   HOME EXERCISE PROGRAM: Access Code: [email or web address] URL:  [email or web address] Date: 06/25/2023 Prepared by: Ellin Goodie  Exercises - Supine Lower Trunk Rotation  - 3-4 x weekly - 2 sets - 10 reps - 10 sec  hold - Supine Hip Adductor Stretch  - 1 x daily - 3 reps - 60 sec hold - Seated Hamstring Stretch  - 1 x daily - 3 reps - 30-60 sec  hold - Seated Hip Abduction with Resistance  - 3-4 x weekly - 3 sets - 10 reps - Standing Forward Step Taps with Counter Support  - 3-4 x weekly - 3 sets - 10 reps - Supine Active Straight Leg Raise  - 3-4 x weekly - 3 sets - 10 reps - Supine Bridge  - 3-4 x weekly - 3 sets - 10 reps - Supine Knee and Hip Extension with Resistance  - 3-4 x weekly - 3 sets - 10 reps - Heel-Toe Walking  - 1 x daily - 5 reps  GOALS: Goals reviewed with patient? Yes  SHORT TERM GOALS: Target date: 03/31/23  Pt to perform 3xSTS in <16 sec  Baseline: eval: 19.95sec  12/10: 14.55 sec   Goal status: MET  2.  Pt to report regular performance of starter HEP at home and readiness/confidence to progress to additional standing activity.  Baseline: issued at eval, not regularly performing prior.  04/18/23: MET- performing independently  Goal status: MET   3.  Pt to demonstrate 134ft AMB in clinic in <1 minute 40 seconds.  Baseline: eval: 75m5s   12/10: 52m 15s   Goal status: MET   LONG TERM GOALS: Target date: 09/22/23  Pt to perform 5xSTS in <19.95 sec  Baseline: eval: 3xSTS in 19.95sec 12/10: 3xSTS 14.55sec 04/18/23: 5 x STS 22.88 sec 06/03/23: 17.85 sec  Goal status: ACHIEVED   2.  Pt to demonstrate tolerance of 237ft AMB without rest break, average speed faster than 0.76m/s Baseline: 0.29 m/s   04/18/23: 0.45 m/sec  Goal status: ACHIEVED   3.  Pt to improve score on MobQoL report measure by >20% to indicate improved mobility-specific quality of life.  Baseline: 03/11/23: 0.54  04/18/23: 0.71 % 06/03/23: 61%  Goal status: Partially Met   4. Patient will improve her RLE stride length to be nearly symmetrical  to LLE for improved gait mechanics and energy efficiency while walking to ambulate longer distances for improved mobility and ambulator status.  Baseline: LLE heel clears past toe of RLE, RLE heel is behind toe of LLE indicating decreased step length. 06/25/23: Left step length>right step length  Goal status: Baseline    ASSESSMENT:  CLINICAL IMPRESSION: Pt continues to have asymmetrical step length with left step length greater than right step length due to decreased knee stability on right knee. She has nearly met all of her goals with the exception of her gait mechanics goal. Pt was able to achieve increase step length with mod VC and mass practice. She will continue to benefit from ongoing practice to improve step length for increased gait efficiency for improved aerobic endurance and decreased fatigue with ambulation. She will continue to benefit from skilled therapy to address deficits in order to improve overall QoL, perform ADLs with increased function, and ambulate with less energy expenditure.    OBJECTIVE IMPAIRMENTS: Abnormal gait, cardiopulmonary status limiting activity, decreased activity tolerance, decreased balance, decreased coordination, decreased endurance, decreased mobility, difficulty walking, decreased ROM, and decreased strength.   ACTIVITY LIMITATIONS: standing, squatting, stairs, transfers, bed mobility, and bathing  PARTICIPATION LIMITATIONS: cleaning, medication management, interpersonal relationship, driving, shopping, and community activity  PERSONAL FACTORS: Age, Behavior pattern, Education, and Past/current experiences are also affecting patient's functional outcome.   REHAB POTENTIAL: Good  CLINICAL DECISION MAKING: Stable/uncomplicated  EVALUATION COMPLEXITY: Moderate  PLAN:  PT FREQUENCY: 1-2x/week  PT DURATION: 6 weeks  PLANNED INTERVENTIONS: 97110-Therapeutic exercises, 97530- Therapeutic activity, [id number]- Neuromuscular re-education, 97535- Self  Care, [id number]- Manual therapy, [id number]- Subsequent splinting/medication, 97014- Electrical stimulation (unattended), [id number]- Electrical stimulation (manual), 82956- Traction (mechanical), Patient/Family education, and Balance training  PLAN FOR NEXT SESSION: Ongoing mass practice with gait training and emphasis on step length. Single leg stance on LLE for improved weight shift onto LLE.    Ellin Goodie PT, DPT  [facility] [address] [place], [address] Phone: [phone]   Fax:  [phone]

## 2023-06-30 NOTE — Progress Notes (Unsigned)
 Virtual Visit via Video Note  I connected with Traci Davis on 07/01/23 at  1:40 PM EST by a video enabled telemedicine application and verified that I am speaking with the correct person using two identifiers.  Location Provider Location : [facility] Patient Location : Home  Participants: Patient ,Daughter, Provider   I discussed the limitations of evaluation and management by telemedicine and the availability of in person appointments. The patient expressed understanding and agreed to proceed.    I discussed the assessment and treatment plan with the patient. The patient was provided an opportunity to ask questions and all were answered. The patient agreed with the plan and demonstrated an understanding of the instructions.   The patient was advised to call back or seek an in-person evaluation if the symptoms worsen or if the condition fails to improve as anticipated.  BH MD OP Progress Note  07/01/2023 2:00 PM RAYAH FINES  MRN:  [id number]  Chief Complaint:  Chief Complaint  Patient presents with   Follow-up   Anxiety   Depression   Medication Refill   HPI: JORGINA Davis is a 53 year old Caucasian female, lives in [facility], has a history of MDD, GAD, insomnia, cerebral palsy, diabetes mellitus on SSD was evaluated by telemedicine today.  She has a history of major depression and generalized anxiety, which have improved since starting her current medication regimen. She is taking Buspar 10 mg three times a day, lamotrigine 25 mg, Prozac 60 mg, and mirtazapine 22.5 mg. Her mood and anxiety symptoms have improved, and she does not feel too depressed or anxious. Her sleep has also improved, although it still takes some time to fall asleep, she is able to sleep through the night.  Denies thoughts of self-harm, auditory or visual hallucinations, and no new allergies or side effects from her medications. Her living situation is stable, and she resides with her daughter.  She reports  significant neck pain that has been bothering her severely for the past week. She has been using a heating pad, which provides some relief after 30 to 45 minutes of application. She has also tried muscle relaxers and pain medication, but these have not been effective. She is awaiting her doctor's return from vacation to discuss further management options.  She denies any other concerns today.  Visit Diagnosis:    ICD-10-CM   1. MDD (major depressive disorder), recurrent, in full remission (HCC)  F33.42     2. GAD (generalized anxiety disorder)  F41.1 lamoTRIgine (LAMICTAL) 25 MG tablet    busPIRone (BUSPAR) 10 MG tablet    3. Insomnia due to medical condition  G47.01    Pain, mood      Past Psychiatric History: I have reviewed past psychiatric history from progress note on 09/12/2017.  Past trials of Xanax, Klonopin, Cymbalta, Rozerem, Ambien, trazodone, doxepin  Past Medical History:  Past Medical History:  Diagnosis Date   Acute pancreatitis 09/04/2014   Anxiety    Arthritis    Asthma    Cerebral palsy (HCC)    Complication of anesthesia    panic attacks before surgery   COPD (chronic obstructive pulmonary disease) (HCC)    Depression    Diabetes mellitus without complication (HCC)    GERD (gastroesophageal reflux disease)    Hypertension    Noninfectious gastroenteritis and colitis 01/10/2013    Past Surgical History:  Procedure Laterality Date   CESAREAN SECTION  1995   CHOLECYSTECTOMY     DILATATION & CURETTAGE/HYSTEROSCOPY WITH  MYOSURE N/A 02/20/2018   Procedure: DILATATION & CURETTAGE/HYSTEROSCOPY WITH MYOSURE ENDOMETRIAL POLYPECTOMY;  Surgeon: Conard Novak, MD;  Location: [facility] ORS;  Service: Gynecology;  Laterality: N/A;   ESOPHAGOGASTRODUODENOSCOPY (EGD) WITH PROPOFOL N/A 01/01/2019   Procedure: ESOPHAGOGASTRODUODENOSCOPY (EGD) WITH PROPOFOL;  Surgeon: Toney Reil, MD;  Location: [facility];  Service: Gastroenterology;  Laterality: N/A;   FOOT  CAPSULE RELEASE W/ PERCUTANEOUS HEEL CORD LENGTHENING, TIBIAL TENDON TRANSFER     age 33 ,and 2010-both legs   JOINT REPLACEMENT Right    both knees partial replacements dates unknown   knee replacment     x 5 per pt. last one- left knee 2014. has had 2 on R 3 on L   TYMPANOPLASTY WITH GRAFT Right 01/08/2018   Procedure: TYMPANOPLASTY WITH POSSIBLE OSSICULAR CHAIN RECONSTRUCTION;  Surgeon: Geanie Logan, MD;  Location: [facility] ORS;  Service: ENT;  Laterality: Right;    Family Psychiatric History: I have reviewed family psychiatric history from progress note on 09/12/2017.  Family History:  Family History  Problem Relation Age of Onset   CAD Father    Heart attack Brother    Sexual abuse Paternal Grandfather    Breast cancer Neg Hx     Social History: I have reviewed social history from progress note on 09/12/2017. Social History   Socioeconomic History   Marital status: Single    Spouse name: Not on file   Number of children: 1   Years of education: Not on file   Highest education level: Associate degree: occupational, Scientist, product/process development, or vocational program  Occupational History   Not on file  Tobacco Use   Smoking status: Never   Smokeless tobacco: Never  Vaping Use   Vaping status: Never Used  Substance and Sexual Activity   Alcohol use: No   Drug use: No   Sexual activity: Yes    Partners: Male    Birth control/protection: Condom  Other Topics Concern   Not on file  Social History Narrative   Not on file   Social Drivers of Health   Financial Resource Strain: Low Risk  (05/23/2023)   Received from [facility]   Overall Financial Resource Strain (CARDIA)    Difficulty of Paying Living Expenses: Not very hard  Recent Concern: Financial Resource Strain - Medium Risk (05/09/2023)   Overall Financial Resource Strain (CARDIA)    Difficulty of Paying Living Expenses: Somewhat hard  Food Insecurity: No Food Insecurity (05/23/2023)   Received from [facility]   Hunger  Vital Sign    Worried About Running Out of Food in the Last Year: Never true    Ran Out of Food in the Last Year: Never true  Recent Concern: Food Insecurity - Food Insecurity Present (05/09/2023)   Hunger Vital Sign    Worried About Running Out of Food in the Last Year: Sometimes true    Ran Out of Food in the Last Year: Sometimes true  Transportation Needs: No Transportation Needs (05/23/2023)   Received from [facility]   PRAPARE - Transportation    Lack of Transportation (Medical): No    Lack of Transportation (Non-Medical): No  Physical Activity: Insufficiently Active (05/09/2023)   Exercise Vital Sign    Days of Exercise per Week: 3 days    Minutes of Exercise per Session: 30 min  Stress: No Stress Concern Present (05/23/2023)   Received from [facility] - Occupational Stress Questionnaire    Feeling of  Stress : Only a little  Social Connections: Socially Integrated (05/23/2023)   Received from [facility]   Social Connection and Isolation Panel [NHANES]    Frequency of Communication with Friends and Family: More than three times a week    Frequency of Social Gatherings with Friends and Family: More than three times a week    Attends Religious Services: More than 4 times per year    Active Member of [employer] or Organizations: Yes    Attends Banker Meetings: More than 4 times per year    Marital Status: Living with partner  Recent Concern: Social Connections - Moderately Isolated (05/09/2023)   Social Connection and Isolation Panel [NHANES]    Frequency of Communication with Friends and Family: Three times a week    Frequency of Social Gatherings with Friends and Family: Twice a week    Attends Religious Services: More than 4 times per year    Active Member of [employer] or Organizations: No    Attends Engineer, structural: Not on file    Marital Status: Never married    Allergies:  Allergies  Allergen  Reactions   Meloxicam Other (See Comments)    Damage kidney   Morphine And Codeine Other (See Comments)    hallucinations   Vantin [Cefpodoxime] Nausea And Vomiting   Latex Itching   Baclofen Other (See Comments)    "makes cerebral palsy do adverse reaction on me", tightens muscles    Ciprofloxacin Itching and Nausea And Vomiting   Prednisone Other (See Comments)    Elevated blood glucose   Tape Rash    skin tears.  Paper tape is ok   Tramadol Nausea Only    Metabolic Disorder Labs: Lab Results  Component Value Date   HGBA1C 8.0 (A) 05/21/2023   No results found for: "PROLACTIN" No results found for: "CHOL", "TRIG", "HDL", "CHOLHDL", "VLDL", "LDLCALC" Lab Results  Component Value Date   TSH 2.198 07/10/2016   TSH 2.891 02/19/2015    Therapeutic Level Labs: No results found for: "LITHIUM" No results found for: "VALPROATE" No results found for: "CBMZ"  Current Medications: Current Outpatient Medications  Medication Sig Dispense Refill   acetaminophen (TYLENOL) 325 MG tablet Take 2 tablets (650 mg total) by mouth every 6 (six) hours as needed. 60 tablet 0   albuterol (PROVENTIL HFA;VENTOLIN HFA) 108 (90 BASE) MCG/ACT inhaler Inhale 2 puffs into the lungs 4 (four) times daily as needed. For shortness of breath and/or wheezing     amLODipine (NORVASC) 5 MG tablet Take 5 mg by mouth.     atorvastatin (LIPITOR) 20 MG tablet Take 1 tablet (20 mg total) by mouth daily. 90 tablet 3   azelastine (ASTELIN) 0.1 % nasal spray Place into both nostrils.     Blood Glucose Monitoring Suppl (GLUCOCOM BLOOD GLUCOSE MONITOR) DEVI Test daily before all meals/snacks and once before bedtime.     budesonide-formoterol (SYMBICORT) 80-4.5 MCG/ACT inhaler Inhale 2 puffs up to 4 times daily as needed for symptoms of wheezing or shortness of breath.     busPIRone (BUSPAR) 10 MG tablet Take 1 tablet (10 mg total) by mouth 3 (three) times daily. 270 tablet 1   Continuous Blood Gluc Receiver (DEXCOM G6  RECEIVER) DEVI 1 Units by Miscellaneous route continuous.     Continuous Blood Gluc Transmit (DEXCOM G6 TRANSMITTER) MISC 1 Units by Miscellaneous route Every three (3) months.     Continuous Glucose Sensor (DEXCOM G7 SENSOR) MISC Apply Dexcom G7 sensor  every 10 days. Type 2 Diabetes uncontrolled (E11.65)     CREON 36000 units CPEP capsule 3 capsules in the morning, at noon, and at bedtime.     cyclobenzaprine (FLEXERIL) 10 MG tablet Take 10 mg by mouth 3 (three) times daily.     diazepam (VALIUM) 5 MG tablet Take 5 mg by mouth. PRN with injecitons (Patient not taking: Reported on 05/13/2023)     donepezil (ARICEPT) 10 MG tablet Take 10 mg by mouth at bedtime.     estradiol (ESTRACE) 0.1 MG/GM vaginal cream Place 1 Applicatorful vaginally.     fluconazole (DIFLUCAN) 150 MG tablet Take one tablet by mouth on Day 1. Repeat dose 2nd tablet on Day 3. (Patient not taking: Reported on 05/13/2023) 2 tablet 0   FLUoxetine (PROZAC) 20 MG capsule Take 1 capsule (20 mg total) by mouth daily. Take along with 40 mg daily - total of 60 mg daily 90 capsule 1   FLUoxetine (PROZAC) 40 MG capsule TAKE 1 CAPSULE BY MOUTH ONCE DAILY . TAKE ALONG WITH 20MG  DAILY AS DIRECTED 90 capsule 1   furosemide (LASIX) 40 MG tablet Take 40 mg by mouth daily as needed for fluid. Taking twice a week     gabapentin (NEURONTIN) 300 MG capsule Take 600 mg by mouth 3 (three) times daily.     glucose blood test strip USE TO TEST BLOOD SUGAR TWO TIMES A DAY     GVOKE HYPOPEN 2-PACK 1 MG/0.2ML SOAJ Inject 1 mg into the skin as needed (hypoglycemia). 1 mL 0   incobotulinumtoxinA (XEOMIN) 100 units SOLR injection Inject 100 Units into the muscle every 3 (three) months.      Incontinence Supply Disposable (PREVAIL BLADDER CONTROL PAD) MISC Large incontinence pads to go into underwear     insulin glargine (LANTUS) 100 UNIT/ML Solostar Pen Inject 10 Units into the skin daily.     Insulin Pen Needle (GLOBAL EASE INJECT PEN NEEDLES) 31G X 5 MM MISC  Use pen needle to inject insulin daily. 100 each 0   ipratropium (ATROVENT) 0.06 % nasal spray Place 2 sprays into both nostrils 4 (four) times daily. For up to 5-7 days then stop. 15 mL 0   lamoTRIgine (LAMICTAL) 25 MG tablet Take 1 tablet (25 mg total) by mouth daily. 90 tablet 1   Lancets (FREESTYLE) lancets Test daily before all meals/snacks     lidocaine (LIDODERM) 5 % Place 2 patches onto the skin daily.     LINZESS 145 MCG CAPS capsule Take 145 mcg by mouth daily before breakfast.     lisinopril (ZESTRIL) 20 MG tablet Take 20 mg by mouth daily.     metFORMIN (GLUCOPHAGE-XR) 500 MG 24 hr tablet Take 1,000 mg by mouth 2 (two) times daily.     mirabegron ER (MYRBETRIQ) 50 MG TB24 tablet Take 1 tablet (50 mg total) by mouth daily. 30 tablet 3   mirtazapine (REMERON) 15 MG tablet Take 1.5 tablets (22.5 mg total) by mouth at bedtime. 135 tablet 1   naloxone (NARCAN) nasal spray 4 mg/0.1 mL      nystatin (MYCOSTATIN/NYSTOP) powder Apply topically.     omeprazole (PRILOSEC) 40 MG capsule Take 1 capsule (40 mg total) by mouth in the morning and at bedtime. 90 capsule 3   ondansetron (ZOFRAN) 4 MG tablet TAKE 1 TABLET BY MOUTH ONCE DAILY AS NEEDED FOR NAUSEA OR VOMITING (Patient not taking: Reported on 05/31/2023) 30 tablet 1   oxyCODONE (OXY IR/ROXICODONE) 5 MG immediate release tablet  Take by mouth. (Patient not taking: Reported on 05/31/2023)     oxyCODONE-acetaminophen (PERCOCET) 10-325 MG tablet Take 1 tablet by mouth 3 (three) times daily as needed.     SUMAtriptan (IMITREX) 100 MG tablet Take 100 mg by mouth as directed.     triamcinolone cream (KENALOG) 0.1 % Apply 1 Application topically 3 (three) times daily.     No current facility-administered medications for this visit.     Musculoskeletal: Strength & Muscle Tone:  UTA Gait & Station:  Seated Patient leans: N/A  Psychiatric Specialty Exam: Review of Systems  Psychiatric/Behavioral:  The patient is nervous/anxious.     Last  menstrual period 08/20/2014.There is no height or weight on file to calculate BMI.  General Appearance: Casual  Eye Contact:  Fair  Speech:  Clear and Coherent  Volume:  Normal  Mood:  Anxious improving  Affect:  Congruent  Thought Process:  Goal Directed and Descriptions of Associations: Intact  Orientation:  Full (Time, Place, and Person)  Thought Content: Logical   Suicidal Thoughts:  No  Homicidal Thoughts:  No  Memory:  Immediate;   Fair Recent;   Fair Remote;   Fair  Judgement:  Fair  Insight:  Fair  Psychomotor Activity:  Normal  Concentration:  Concentration: Fair and Attention Span: Fair  Recall:  [employer] of Knowledge: Fair  Language: Fair  Akathisia:  No  Handed:  Right  AIMS (if indicated): not done  Assets:  Desire for Improvement Housing Social Support Transportation  ADL's:  Intact  Cognition: WNL  Sleep:  Fair   Screenings: Geneticist, molecular Office Visit from 04/05/2022 in [facility] Office Visit from 10/16/2021 in [facility]  AIMS Total Score 0 0      GAD-7    Flowsheet Row Video Visit from 12/06/2022 in [facility] Office Visit from 04/05/2022 in [facility] Video Visit from 06/06/2021 in [facility]  Total GAD-7 Score 5 8 1       PHQ2-9    Flowsheet Row Office Visit from 02/27/2023 in [facility] Video Visit from 12/06/2022 in [facility] Office Visit from 04/05/2022 in [facility] Video Visit from 11/13/2021 in [facility] Office Visit from 10/16/2021 in [facility]  PHQ-2 Total Score 0 0 2 0 1  PHQ-9 Total Score -- -- 9 -- 5      Flowsheet Row Video Visit  from 07/01/2023 in [facility] Office Visit from 04/04/2023 in [facility] Video Visit from 02/12/2023 in [facility]  C-SSRS RISK CATEGORY No Risk No Risk No Risk        Assessment and Plan: Traci Davis is a 53 year old Caucasian female who has a history of depression, anxiety, chronic pain was evaluated by telemedicine today.  Discussed assessment and plan as noted below.  Major Depression in remission Reports improvement in mood since starting medications. No current depressive symptoms or suicidal ideation. No auditory or visual hallucinations. Discussed the benefits of continuing current medications and the importance of adherence to maintain mood stability. - Continue current medications: Prozac 60 mg, Lamotrigine 25 mg, Mirtazapine 22.5 mg, Buspar 10 mg three times a day  Generalized Anxiety Disorder-improving Reports improvement  in anxiety symptoms since starting medications. No current anxiety symptoms. Discussed the benefits of continuing current medications and the importance of adherence to maintain anxiety control. - Continue current medications: Prozac 60 mg, Lamotrigine 25 mg, Mirtazapine 22.5 mg, Buspar 10 mg three times a day  Insomnia-improving Chronic insomnia exacerbated by situational stressors as well as pain.  Currently improved on the current medication regimen. - Continue mirtazapine 22.5 mg at bedtime - Patient will need sufficient pain management.    Follow-up - Follow-up in three months via video visit on June 2nd at 1:20 PM.  Collaboration of Care: Collaboration of Care: Other encouraged to follow up with provider for management of pain.  Patient/Guardian was advised Release of Information must be obtained prior to any record release in order to collaborate their care with an outside provider. Patient/Guardian was advised if they have not  already done so to contact the registration department to sign all necessary forms in order for [place] to release information regarding their care.   Consent: Patient/Guardian gives verbal consent for treatment and assignment of benefits for services provided during this visit. Patient/Guardian expressed understanding and agreed to proceed.  Discussed the use of a AI scribe software for clinical note transcription with the patient, who gave verbal consent to proceed. This note was generated in part or whole with voice recognition software. Voice recognition is usually quite accurate but there are transcription errors that can and very often do occur. I apologize for any typographical errors that were not detected and corrected.      Jomarie Longs, MD 07/02/2023, 9:33 AM

## 2023-07-01 ENCOUNTER — Encounter: Payer: Self-pay | Admitting: Psychiatry

## 2023-07-01 ENCOUNTER — Telehealth (INDEPENDENT_AMBULATORY_CARE_PROVIDER_SITE_OTHER): Payer: 59 | Admitting: Psychiatry

## 2023-07-01 DIAGNOSIS — G4701 Insomnia due to medical condition: Secondary | ICD-10-CM

## 2023-07-01 DIAGNOSIS — F411 Generalized anxiety disorder: Secondary | ICD-10-CM

## 2023-07-01 DIAGNOSIS — R52 Pain, unspecified: Secondary | ICD-10-CM | POA: Diagnosis not present

## 2023-07-01 DIAGNOSIS — F3342 Major depressive disorder, recurrent, in full remission: Secondary | ICD-10-CM | POA: Diagnosis not present

## 2023-07-01 MED ORDER — LAMOTRIGINE 25 MG PO TABS
25.0000 mg | ORAL_TABLET | Freq: Every day | ORAL | 1 refills | Status: DC
Start: 2023-07-01 — End: 2024-01-13

## 2023-07-01 MED ORDER — BUSPIRONE HCL 10 MG PO TABS: 10.0000 mg | ORAL_TABLET | Freq: Three times a day (TID) | ORAL | 1 refills | Status: DC

## 2023-07-05 ENCOUNTER — Other Ambulatory Visit: Payer: Self-pay

## 2023-07-05 MED ORDER — OMEPRAZOLE 40 MG PO CPDR: 40.0000 mg | DELAYED_RELEASE_CAPSULE | Freq: Two times a day (BID) | ORAL | 3 refills | Status: DC

## 2023-07-08 DIAGNOSIS — G3184 Mild cognitive impairment, so stated: Secondary | ICD-10-CM | POA: Diagnosis not present

## 2023-07-08 DIAGNOSIS — R519 Headache, unspecified: Secondary | ICD-10-CM | POA: Diagnosis not present

## 2023-07-08 DIAGNOSIS — M549 Dorsalgia, unspecified: Secondary | ICD-10-CM | POA: Diagnosis not present

## 2023-07-08 DIAGNOSIS — R569 Unspecified convulsions: Secondary | ICD-10-CM | POA: Diagnosis not present

## 2023-07-08 DIAGNOSIS — G479 Sleep disorder, unspecified: Secondary | ICD-10-CM | POA: Diagnosis not present

## 2023-07-08 DIAGNOSIS — M542 Cervicalgia: Secondary | ICD-10-CM | POA: Diagnosis not present

## 2023-07-08 DIAGNOSIS — Z8669 Personal history of other diseases of the nervous system and sense organs: Secondary | ICD-10-CM | POA: Diagnosis not present

## 2023-07-08 DIAGNOSIS — G8929 Other chronic pain: Secondary | ICD-10-CM | POA: Diagnosis not present

## 2023-07-09 ENCOUNTER — Ambulatory Visit: Payer: 59 | Admitting: Physical Therapy

## 2023-07-10 ENCOUNTER — Ambulatory Visit: Admitting: Family Medicine

## 2023-07-11 ENCOUNTER — Other Ambulatory Visit: Payer: Self-pay | Admitting: Family Medicine

## 2023-07-11 ENCOUNTER — Telehealth: Payer: Self-pay

## 2023-07-11 DIAGNOSIS — I10 Essential (primary) hypertension: Secondary | ICD-10-CM

## 2023-07-11 DIAGNOSIS — K219 Gastro-esophageal reflux disease without esophagitis: Secondary | ICD-10-CM

## 2023-07-11 DIAGNOSIS — E782 Mixed hyperlipidemia: Secondary | ICD-10-CM

## 2023-07-11 DIAGNOSIS — E1169 Type 2 diabetes mellitus with other specified complication: Secondary | ICD-10-CM

## 2023-07-11 NOTE — Telephone Encounter (Signed)
 Copied from CRM [id number]. Topic: General - Other >> Jul 11, 2023  2:07 PM Elle L wrote: Reason for CRM: The patient is requesting her updated medication list be faxed to [facility]. Their fax number is [phone] with attention to [facility] in order to fill out her medication compliance paperwork. The patient is requesting a call back when this has been completed at [phone].

## 2023-07-11 NOTE — Addendum Note (Signed)
 Addended by: Smitty Cords on: 07/11/2023 06:13 PM   Modules accepted: Orders

## 2023-07-11 NOTE — Telephone Encounter (Signed)
 Copied from CRM [id number]. Topic: Clinical - Medication Question >> Jul 11, 2023  4:04 PM [name] T wrote: Reason for CRM: patient called stated all her meds needs to go through [facility] instead of [facility]. Patient says everything has been stopped with [facility] and a medication compliance packet needs to be sent to [facility]

## 2023-07-11 NOTE — Telephone Encounter (Signed)
 This patient will need a detailed medication rec to confirm exactly what she is taking.  I reviewed her med list. I was only able to find 4 rx that I am actively ordering.  Atorvastatin 20mg  daily Lisinopril 20mg  daily Metformin XR 500mg  x 2 tab = 1000mg  TWICE A DAY  Omeprazole 40mg  twice a day before meals  These meds below I am unsure who is currently prescribing. Will need to ask her to find out if I need to re order them or if a specialist is managing  - Linzess 145 mcg daily - Gabapentin 300mg  x 2 = 600mg  THREE TIMES A DAY - Furosemide 40mg  twice a week AS NEEDED - Donepezil 10mg  daily - Cyclobenzaprine 10mg  THREE TIMES A DAY AS NEEDED - Creon 36000 units CPEP capsule 3 caps 3 times daily - Amlodipine 5mg  daily -----  She has an extensive medication list of meds that are being prescribed by specialists and other providers.  So, I am unsure which medications she needs me to order. Some of them I cannot order and the specialists will need to submit orders for.  Medications from specialists - Mirtazapine 15mg  take 1.5 tab nightly (Psychiatry) - Buspar 10mg  THREE TIMES A DAY anxiety (Psychiatry) - Lamotrigine 25mg  daily (Psychiatry) - Fluoxetine 20 and 40mg  both = 60mg  daily (Psychiatry)  - Mirabegron ER 50mg  daily (Urology)   AS NEEDED Medications will need to be clarified. I imagine these do not go into the compliance / bubble pack - Sumatriptan 100mg  as needed for migraine  Non pill medications - Albuterol - Estradiol vaginal cream - Insulin - Symbicort inhaler - Azelastine nasal - Atrovent nasal - Nystatin powder - Triamcinolone cream

## 2023-07-16 ENCOUNTER — Ambulatory Visit: Payer: 59 | Attending: Physical Medicine and Rehabilitation | Admitting: Physical Therapy

## 2023-07-16 DIAGNOSIS — M542 Cervicalgia: Secondary | ICD-10-CM | POA: Diagnosis not present

## 2023-07-16 DIAGNOSIS — M25552 Pain in left hip: Secondary | ICD-10-CM | POA: Insufficient documentation

## 2023-07-16 DIAGNOSIS — R262 Difficulty in walking, not elsewhere classified: Secondary | ICD-10-CM | POA: Diagnosis not present

## 2023-07-16 NOTE — Therapy (Signed)
 OUTPATIENT PHYSICAL THERAPY DISCHARGE    Patient Name: Traci Davis MRN: [id number] DOB:Dec 18, 1970, 53 y.o., female Today's Date: 03/11/2023   PCP: Saralyn Pilar, DO REFERRING PROVIDER: Windle Guard, DO   END OF SESSION:  PT End of Session - 07/16/23 1648     Visit Number 13    Number of Visits 20    Date for PT Re-Evaluation 09/22/23    Authorization Type UHC dual complete; [place] Medicaid- Medical Necessity    Authorization Time Period 06/25/23-09/22/23    Authorization - Visit Number 13    Authorization - Number of Visits 20    Progress Note Due on Visit 20    PT Start Time 1645    PT Stop Time 1730    PT Time Calculation (min) 45 min    Activity Tolerance Patient tolerated treatment well    Behavior During Therapy [facility] for tasks assessed/performed                     Past Medical History:  Diagnosis Date   Acute pancreatitis 09/04/2014   Anxiety    Arthritis    Asthma    Cerebral palsy (HCC)    Complication of anesthesia    panic attacks before surgery   COPD (chronic obstructive pulmonary disease) (HCC)    Depression    Diabetes mellitus without complication (HCC)    GERD (gastroesophageal reflux disease)    Hypertension    Noninfectious gastroenteritis and colitis 01/10/2013    Past Surgical History:  Procedure Laterality Date   CESAREAN SECTION  1995   CHOLECYSTECTOMY     DILATATION & CURETTAGE/HYSTEROSCOPY WITH MYOSURE N/A 02/20/2018   Procedure: DILATATION & CURETTAGE/HYSTEROSCOPY WITH MYOSURE ENDOMETRIAL POLYPECTOMY;  Surgeon: Conard Novak, MD;  Location: [facility] ORS;  Service: Gynecology;  Laterality: N/A;   ESOPHAGOGASTRODUODENOSCOPY (EGD) WITH PROPOFOL N/A 01/01/2019   Procedure: ESOPHAGOGASTRODUODENOSCOPY (EGD) WITH PROPOFOL;  Surgeon: Toney Reil, MD;  Location: [facility] ENDOSCOPY;  Service: Gastroenterology;  Laterality: N/A;   FOOT CAPSULE RELEASE W/ PERCUTANEOUS HEEL CORD LENGTHENING, TIBIAL TENDON TRANSFER     age 64  ,and 2010-both legs   JOINT REPLACEMENT Right    both knees partial replacements dates unknown   knee replacment     x 5 per pt. last one- left knee 2014. has had 2 on R 3 on L   TYMPANOPLASTY WITH GRAFT Right 01/08/2018   Procedure: TYMPANOPLASTY WITH POSSIBLE OSSICULAR CHAIN RECONSTRUCTION;  Surgeon: Geanie Logan, MD;  Location: [facility] ORS;  Service: ENT;  Laterality: Right;    Patient Active Problem List   Diagnosis Date Noted   Urge urinary incontinence 04/02/2023   Incontinence of feces 04/02/2023   Vaginal atrophy 04/02/2023   Asthma, persistent controlled 02/06/2023   Continuous leakage of urine 01/04/2023   De Quervain's tenosynovitis, right 10/08/2022   Hot flashes due to menopause 07/17/2022   Convulsions (HCC) 07/09/2022   Obesity due to excess calories 09/22/2020   Insomnia due to medical condition 05/11/2020   Hamstring tendonitis 01/29/2020   MDD (major depressive disorder), recurrent, in full remission (HCC) 08/12/2019   Seizure-like activity (HCC) 07/08/2019   Mild episode of recurrent major depressive disorder (HCC) 12/29/2018   Insomnia due to mental condition 12/29/2018   Disorder of vein 03/04/2018   Lumbar sprain 03/04/2018   Muscle weakness 03/04/2018   Neck sprain 03/04/2018   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   Postmenopausal bleeding 02/18/2018   Impingement syndrome of shoulder region 02/04/2018   Chest pain with moderate  risk for cardiac etiology 05/19/2017   GAD (generalized anxiety disorder) 04/15/2017   Chronic daily headache 04/15/2017   Panic attack 04/15/2017   Nocturnal enuresis 04/15/2017   Mild cognitive impairment 04/15/2017   Loss of memory 01/14/2017   Pain medication agreement signed 01/08/2017   Headache disorder 12/04/2016   Chronic, continuous use of opioids 07/01/2015   Vertigo 07/01/2015   Chronic midline low back pain without sciatica 03/21/2015   Type 2 diabetes mellitus with other specified complication (HCC) 09/05/2014   Spastic  diplegic cerebral palsy (HCC) 01/05/2014   Hip pain, chronic 04/28/2013   Essential hypertension 01/10/2013   Irritable colon 01/10/2013   Encounter for long-term (current) use of other medications 12/04/2012   Chronic GERD 08/12/2012   Diarrhea 08/12/2012   Primary localized osteoarthrosis, lower leg 05/07/2012   Difficulty walking 02/06/2012     ONSET DATE: chronic, congential   REFERRING DIAG: spastic diplegic CP; Rt leg pain; Lt leg pain   THERAPY DIAG:  Difficulty in walking, not elsewhere classified  Rationale for Evaluation and Treatment: Rehabilitation    SUBJECTIVE:                                                                                                                                                                                             SUBJECTIVE STATEMENT: Pt has not had any issues with her balance in a while now. Her biggest concern at this point is her neck at this point. She has requested referral for right sided neck pain. She says the only thing that helps with neck pain is using heat. She has been feeling depressed lately and she has been confined to her house. She wants to try and use a bike for exercise and she found a model at [facility] that she likes but she is not sure if she can do it or if the bike seat will fit her.   PERTINENT HISTORY:  Traci Davis is a 51yoF who is referred to this clinic after a period of immobility and subsequent weakness upon resumption. Pt had a Rt wrist surgery in September and did not feel safe AMB with wrist splint. Pt reports her legs are much weakness now and she is having trouble mobilizing at her baseline. Pt familiar to this clinic from prior episodes.   PAIN:  Are you having pain? Yes, 7/10 low back    PRECAUTIONS: Wears Left KAFO, Rt knee OA brace; has been released from Rt wrist splint but wears it when mobilizing due to soreness and fear of hitting it on something.  WEIGHT BEARING RESTRICTIONS: WBAT  RUE  FALLS: Has patient  fallen in last 6 months? 3 falls in past 6 months    PLOF: mod I AMB at baseline; platform walking in household, sometimes short community distances; power scooter or manual WC for community distance AMB; pt drives  PATIENT GOALS: help return to her baseline in AMB overground   OBJECTIVE:  Note: Objective measures were completed at Evaluation unless otherwise noted.  LOWER EXTREMITY ROM:    P/ROM  Right Eval Left Eval  Hip flexion    Hip extension    Hip abduction    Hip adduction    Hip internal rotation    Hip external rotation    Knee flexion    Knee extension    Ankle dorsiflexion    Ankle plantarflexion    Ankle inversion    Ankle eversion      LOWER EXTREMITY STRENGTH:    MMT Right Eval Left Eval  Hip flexion    Hip extension    Hip abduction    Hip adduction    Hip internal rotation    Hip external rotation    Knee flexion    Knee extension    Ankle dorsiflexion    Ankle plantarflexion    Ankle inversion    Ankle eversion    (Blank rows = not tested)  BED MOBILITY:  Sleeps in a regular bed, uses a trapeze for repositioning; no adjustment, no bed rails   TRANSFERS: Assistive device utilized: ModI STS form platform walking   RAMP:  Level of Assistance: can typically manage c with WC or with platform walker   CURB:  Level of Assistance: can typically manage c with WC or with platform walker   STAIRS: Level of Assistance: does not AMB stairs at baseline   GAIT: Gait pattern: double platform walker with with Left KAFO, but will occasionally AMB the household without KAFO while in socks.  FUNCTIONAL TESTS:  -3xSTS from chair: hands ad lib, walker ad lib, Left KAFO, Rt knee brace: 19.95sec -166ft AMB: Left AFO, double platform RW; 2 minutes, 5 seconds (0.23m/s)   TODAY'S TREATMENT DATE:   07/16/23:  SELF CARE  Discussion about fitting for bike for her to do cardio on. -Called to determine which bike shops had  tricycle   -Determined that pedals needed to be placed more forward due to restriction of brace with knee flexion and plastic digging into pt's hamstring  THEREX  Nu-Step seat at 8 and arms at 8 with resistance at 1 for 10 min  Matrix Recumbent Bicycle seat at 7 for 3 min  -Pt unable to fully pedal because of left KAFO restricting left knee flexion  10 m ambulation with 2WW x 6    -min VC to maintain equal step length     06/25/23: GAIT TRAINING  Standing Marches with RLE use of BUE support using walker 2 x 10  Standing Step Overs with RLE and use of BUE support using walker 2 x 10  -obstacle is a red ankle weight  Standing Marches with LLE use of BUE support using walker 2 x 10   Gait Training  20 ft x 20 with emphasis on increased step length.  Mod VC to place heel of swing foot past toes of stance foot.  -Pt shows increased step length on LLE compared to RLE    06/18/23: All single RLE  THEREX  Supine Hip ER Figure 4 Stretch 3 x 30 sec  Supine Hip and Knee Extension with blue band 2 x 10  -tactile  cue to increase left quad activation  Supine LLE leg lift 2 x 10 Supine Bridges 2 x 10  Supine Hip Adduction Stretch with added pillows beneath knees and #5 AW for 4 min     06/03/23: THEREX  MobQoL: (17/28) x 100 = 61% Supine Marches with bent knee 2 x 10 Supine Marches with straight leg  2 x 10  -Only able to flex to 45 deg   Seated HS Stretch on RLE 2 x 30 sec  -min VC to increase forward lean and to place pressure on knee  Gait Analysis: Increased internal rotation on right hip with decreased step length   05/30/23 Straight Leg Raise on LLE with brace locked into extension 1 x 5 Straight Leg Raise on LLE with brace locked into extension 2 x 8 Bridges with extended knee for HS bias 2 X 10  -Pt reports increased low back tightness.  Sit to Stand from 18 inch chair height 1 x 5  Sit to Stand with 22 inch chair height with back pack with 8 lbs and knee brace locked on LLE 1 x  10  -Pt only loading RLE  Standing Hip Extension on LLE with BUE support and LLE brace locked 1 x 5  -Pt unable to perform without increased toe drag Bent over hip extension on LLE with BUE support and LLE brace locked 1 x 10 -Pt unable to extend hip beyond 5 deg flexion   PATIENT EDUCATION: Education details: Patient  Person educated: plan for assessment this date and how to generate goals for improvement Education method: Discussion Education comprehension: good   HOME EXERCISE PROGRAM: Access Code: [email or web address] URL: [email or web address] Date: 07/16/2023 Prepared by: Ellin Goodie  Exercises - Supine Lower Trunk Rotation  - 3-4 x weekly - 2 sets - 10 reps - 10 sec  hold - Supine Hip Adductor Stretch  - 1 x daily - 3 reps - 60 sec hold - Seated Hamstring Stretch  - 1 x daily - 3 reps - 30-60 sec  hold - Standing Forward Step Taps with Counter Support  - 3-4 x weekly - 3 sets - 10 reps - Supine Active Straight Leg Raise  - 3-4 x weekly - 3 sets - 10 reps - Supine Bridge  - 3-4 x weekly - 3 sets - 10 reps  GOALS: Goals reviewed with patient? Yes  SHORT TERM GOALS: Target date: 03/31/23  Pt to perform 3xSTS in <16 sec  Baseline: eval: 19.95sec  12/10: 14.55 sec   Goal status: MET  2.  Pt to report regular performance of starter HEP at home and readiness/confidence to progress to additional standing activity.  Baseline: issued at eval, not regularly performing prior.  04/18/23: MET- performing independently  Goal status: MET   3.  Pt to demonstrate 111ft AMB in clinic in <1 minute 40 seconds.  Baseline: eval: 62m5s   12/10: 33m 15s   Goal status: MET   LONG TERM GOALS: Target date: 09/22/23  Pt to perform 5xSTS in <19.95 sec  Baseline: eval: 3xSTS in 19.95sec 12/10: 3xSTS 14.55sec 04/18/23: 5 x STS 22.88 sec 06/03/23: 17.85 sec  Goal status: ACHIEVED   2.  Pt to demonstrate tolerance of 275ft AMB without rest break, average speed faster than  0.67m/s Baseline: 0.29 m/s   04/18/23: 0.45 m/sec  Goal status: ACHIEVED   3.  Pt to improve score on MobQoL report measure by >20% to indicate improved mobility-specific quality of life.  Baseline: 03/11/23: 0.54  04/18/23:  0.71 % 06/03/23: 61%  Goal status: NOT MET   4. Patient will improve her RLE stride length to be nearly symmetrical to LLE for improved gait mechanics and energy efficiency while walking to ambulate longer distances for improved mobility and ambulator status.  Baseline: LLE heel clears past toe of RLE, RLE heel is behind toe of LLE indicating decreased step length. 06/25/23: Left step length>right step length  Goal status: NOT MET    ASSESSMENT:  CLINICAL IMPRESSION: Despite not meeting all of her goals, pt wants to end this plan of care to prioritize her neck pain. She will reach out to physician to send over referral again for neck. Majority of session spent determining whether bike was a feasible plan for pt to complete cardio on. PT able to identify that pt would benefit most from tricycle bike with larger seat and potential vendor identified in [place]. Pt's depression is being managed by her medical team and she believes that she has it under control. HEP modified to include exercises to continue to maintain LE strength and dynamic balance.     OBJECTIVE IMPAIRMENTS: Abnormal gait, cardiopulmonary status limiting activity, decreased activity tolerance, decreased balance, decreased coordination, decreased endurance, decreased mobility, difficulty walking, decreased ROM, and decreased strength.   ACTIVITY LIMITATIONS: standing, squatting, stairs, transfers, bed mobility, and bathing  PARTICIPATION LIMITATIONS: cleaning, medication management, interpersonal relationship, driving, shopping, and community activity  PERSONAL FACTORS: Age, Behavior pattern, Education, and Past/current experiences are also affecting patient's functional outcome.   REHAB POTENTIAL:  Good  CLINICAL DECISION MAKING: Stable/uncomplicated  EVALUATION COMPLEXITY: Moderate  PLAN:  PT FREQUENCY: 1-2x/week  PT DURATION: 6 weeks  PLANNED INTERVENTIONS: 97110-Therapeutic exercises, 97530- Therapeutic activity, [id number]- Neuromuscular re-education, 97535- Self Care, 47829- Manual therapy, [id number]- Subsequent splinting/medication, 97014- Electrical stimulation (unattended), [id number]- Electrical stimulation (manual), 56213- Traction (mechanical), Patient/Family education, and Balance training  PLAN FOR NEXT SESSION: Discharge from PT    Ellin Goodie PT, DPT  [facility] [address], [place], [address] Phone: [phone]   Fax:  [phone]

## 2023-07-17 ENCOUNTER — Encounter: Payer: 59 | Admitting: Physical Therapy

## 2023-07-18 ENCOUNTER — Telehealth: Payer: Self-pay

## 2023-07-18 ENCOUNTER — Ambulatory Visit: Payer: 59 | Admitting: Physical Therapy

## 2023-07-18 NOTE — Progress Notes (Signed)
 Care Guide Pharmacy Note  07/18/2023 Name: Traci Davis MRN: [id number] DOB: Sep 07, 1970  Referred By: Smitty Cords, DO Reason for referral: Care Coordination (Outreach to schedule with Pharm d )   Traci Davis is a 53 y.o. year old female who is a primary care patient of Smitty Cords, DO.  Traci Davis was referred to the pharmacist for assistance related to: HTN and DMII  Successful contact was made with the patient to discuss pharmacy services including being ready for the pharmacist to call at least 5 minutes before the scheduled appointment time and to have medication bottles and any blood pressure readings ready for review. The patient agreed to meet with the pharmacist via telephone visit on (date/time).  Traci Davis , RMA     [facility]  [facility], [facility] Guide  Direct Dial: [phone]  Website: [email or web address]

## 2023-07-19 ENCOUNTER — Telehealth: Payer: Self-pay | Admitting: Pharmacist

## 2023-07-19 ENCOUNTER — Other Ambulatory Visit

## 2023-07-19 NOTE — Telephone Encounter (Signed)
   Outreach Note  07/19/2023 Name: VIANNA VENEZIA MRN: [id number] DOB: [birth year]  Referred by: Smitty Cords, DO  Was unable to reach patient via telephone today x 2 and have left HIPAA compliant voicemail asking patient to return my call.    Follow Up Plan: Will collaborate with Care Guide to outreach to schedule follow up with me  Estelle Grumbles, PharmD, [place]y [place], CPP Clinical Pharmacist [facility] [phone]

## 2023-07-23 ENCOUNTER — Ambulatory Visit: Payer: 59 | Admitting: Physical Therapy

## 2023-07-23 DIAGNOSIS — M25552 Pain in left hip: Secondary | ICD-10-CM | POA: Diagnosis not present

## 2023-07-23 DIAGNOSIS — R262 Difficulty in walking, not elsewhere classified: Secondary | ICD-10-CM | POA: Diagnosis not present

## 2023-07-23 DIAGNOSIS — M542 Cervicalgia: Secondary | ICD-10-CM | POA: Diagnosis not present

## 2023-07-23 NOTE — Therapy (Unsigned)
 OUTPATIENT PHYSICAL THERAPY CERVICAL EVALUATION   Patient Name: Traci Davis MRN: [id number] DOB:1970/08/09, 53 y.o., female Today's Date: 07/23/2023  END OF SESSION:  PT End of Session - 07/23/23 2134     Visit Number 1    Number of Visits 20    Date for PT Re-Evaluation 10/01/23    Authorization Type UHC Medicare Dual (VL based on mn)    Progress Note Due on Visit 10    PT Start Time 1600    PT Stop Time 1645    PT Time Calculation (min) 45 min    Activity Tolerance Patient tolerated treatment well    Behavior During Therapy [facility] for tasks assessed/performed             Past Medical History:  Diagnosis Date   Acute pancreatitis 09/04/2014   Anxiety    Arthritis    Asthma    Cerebral palsy (HCC)    Complication of anesthesia    panic attacks before surgery   COPD (chronic obstructive pulmonary disease) (HCC)    Depression    Diabetes mellitus without complication (HCC)    GERD (gastroesophageal reflux disease)    Hypertension    Noninfectious gastroenteritis and colitis 01/10/2013   Past Surgical History:  Procedure Laterality Date   CESAREAN SECTION  1995   CHOLECYSTECTOMY     DILATATION & CURETTAGE/HYSTEROSCOPY WITH MYOSURE N/A 02/20/2018   Procedure: DILATATION & CURETTAGE/HYSTEROSCOPY WITH MYOSURE ENDOMETRIAL POLYPECTOMY;  Surgeon: Conard Novak, MD;  Location: [facility] ORS;  Service: Gynecology;  Laterality: N/A;   ESOPHAGOGASTRODUODENOSCOPY (EGD) WITH PROPOFOL N/A 01/01/2019   Procedure: ESOPHAGOGASTRODUODENOSCOPY (EGD) WITH PROPOFOL;  Surgeon: Toney Reil, MD;  Location: [facility] ENDOSCOPY;  Service: Gastroenterology;  Laterality: N/A;   FOOT CAPSULE RELEASE W/ PERCUTANEOUS HEEL CORD LENGTHENING, TIBIAL TENDON TRANSFER     age 53 ,and 2010-both legs   JOINT REPLACEMENT Right    both knees partial replacements dates unknown   knee replacment     x 5 per pt. last one- left knee 2014. has had 2 on R 3 on L   TYMPANOPLASTY WITH GRAFT Right 01/08/2018    Procedure: TYMPANOPLASTY WITH POSSIBLE OSSICULAR CHAIN RECONSTRUCTION;  Surgeon: Geanie Logan, MD;  Location: [facility] ORS;  Service: ENT;  Laterality: Right;   Patient Active Problem List   Diagnosis Date Noted   Urge urinary incontinence 04/02/2023   Incontinence of feces 04/02/2023   Vaginal atrophy 04/02/2023   Asthma, persistent controlled 02/06/2023   Continuous leakage of urine 01/04/2023   De Quervain's tenosynovitis, right 10/08/2022   Hot flashes due to menopause 07/17/2022   Convulsions (HCC) 07/09/2022   Obesity due to excess calories 09/22/2020   Insomnia due to medical condition 05/11/2020   Hamstring tendonitis 01/29/2020   MDD (major depressive disorder), recurrent, in full remission (HCC) 08/12/2019   Seizure-like activity (HCC) 07/08/2019   Mild episode of recurrent major depressive disorder (HCC) 12/29/2018   Insomnia due to mental condition 12/29/2018   Disorder of vein 03/04/2018   Lumbar sprain 03/04/2018   Muscle weakness 03/04/2018   Neck sprain 03/04/2018   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   Postmenopausal bleeding 02/18/2018   Impingement syndrome of shoulder region 02/04/2018   Chest pain with moderate risk for cardiac etiology 05/19/2017   GAD (generalized anxiety disorder) 04/15/2017   Chronic daily headache 04/15/2017   Panic attack 04/15/2017   Nocturnal enuresis 04/15/2017   Mild cognitive impairment 04/15/2017   Loss of memory 01/14/2017   Pain medication agreement signed  01/08/2017   Headache disorder 12/04/2016   Chronic, continuous use of opioids 07/01/2015   Vertigo 07/01/2015   Chronic midline low back pain without sciatica 03/21/2015   Type 2 diabetes mellitus with other specified complication (HCC) 09/05/2014   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   Hip pain, chronic 04/28/2013   Essential hypertension 01/10/2013   Irritable colon 01/10/2013   Encounter for long-term (current) use of other medications 12/04/2012   Chronic GERD  08/12/2012   Diarrhea 08/12/2012   Primary localized osteoarthrosis, lower leg 05/07/2012   Difficulty walking 02/06/2012    PCP: Dr. Saralyn Pilar   REFERRING PROVIDER: Dr. Merrily Brittle   REFERRING DIAG: Cervicalgia   THERAPY DIAG:  Cervicalgia  Rationale for Evaluation and Treatment: Rehabilitation  ONSET DATE: 2019 it started   SUBJECTIVE:                                                                                                                                                                                                         SUBJECTIVE STATEMENT: See pertinent history  Hand dominance: Right  PERTINENT HISTORY:  Neck pain started around 5 years ago and it has occurred on her right side that is affected by cerebral palsy. She started having cervical radicular pain after receiving the most recent injection on February 12th. The pain travels down from her neck into the palm of her hand. She describes feeling numbness and pain. She went to [facility] for neuro   PAIN:  Are you having pain? Yes: NPRS scale: 7/10  Pain location: Right upper trap and radiates down lateral side of arm into palm of hand  Pain description: Sharp and shooting pain  Aggravating factors: Lifting phone up   Relieving factors: Heating pad   PRECAUTIONS: None  RED FLAGS: None     WEIGHT BEARING RESTRICTIONS: No  FALLS:  Has patient fallen in last 6 months? No  LIVING ENVIRONMENT: Lives with: lives with their partner Lives in: House/apartment Stairs: No Has following equipment at home: Dan Humphreys - 2 wheeled, Wheelchair (manual), shower chair, and Grab bars  OCCUPATION: None   PLOF: Independent  PATIENT GOALS: Decrease pain   NEXT MD VISIT: May 14th   OBJECTIVE:  Note: Objective measures were completed at Evaluation unless otherwise noted.  VITALS BP 130/77 HR 90 SpO22   DIAGNOSTIC FINDINGS:  None listed for cervical spine (Per pt's [facility] report - with left sided narrowing of foramin nerve compression at C6-C7  PATIENT SURVEYS:  NDI NT   COGNITION: Overall cognitive  status: Within functional limits for tasks assessed  SENSATION: Decreased sensation in RUE   POSTURE: rounded shoulders  PALPATION: Right upper trap tender to palpate    CERVICAL ROM:   Active ROM A/PROM (deg) eval  Flexion 45  Extension 25/45  Right lateral flexion 20  Left lateral flexion 20  Right rotation 55*  Left rotation 55   (Blank rows = not tested)           UPPER EXTREMITY ROM:  AROM Right eval Left eval  Shoulder flexion    Shoulder extension    Shoulder abduction    Shoulder adduction    Shoulder extension    Shoulder internal rotation    Shoulder external rotation    Middle trapezius    Lower trapezius    Elbow flexion    Elbow extension    Wrist flexion    Wrist extension    Wrist ulnar deviation    Wrist radial deviation    Wrist pronation    Wrist supination    Grip strength     (Blank rows = not tested)  Combined Shoulder ER R/L NT  Combined Shoulder IR R/L NT   UPPER EXTREMITY MMT:  MMT Right eval Left eval  Shoulder flexion 4 4  Shoulder extension    Shoulder abduction 4 4  Shoulder adduction    Shoulder extension    Shoulder internal rotation 4 4  Shoulder external rotation 4- 4-  Elbow flexion 4 4  Elbow extension    Wrist flexion 4 4  Wrist extension 4 4  Wrist ulnar deviation 4 4  Wrist radial deviation 4 4  Wrist pronation 4 4  Wrist supination 4 4   (Blank rows = not tested) Grip Strength                                     Fair              Fair    CERVICAL SPECIAL TESTS:  Upper limb tension test (ULTT): NT and Distraction test: NT  Spurling's : Positive on Left    TREATMENT DATE:   07/23/23:                                                                                                                              Upper trap stretch 2 x 60 sec  -Pt reports  increased pain when stretching to left side    PATIENT EDUCATION:  Education details: Form and technique for correct performance of exercise.  Person educated: Patient Education method: Explanation, Verbal cues, and Handouts Education comprehension: verbalized understanding, returned demonstration, verbal cues required, and tactile cues required  HOME EXERCISE PROGRAM: Access Code: [email or web address] URL: [email or web address] Date: 07/23/2023 Prepared by: Ellin Goodie  Exercises - Seated Upper Trapezius Stretch  - 1 x daily - 7 x weekly - 3 reps -  30-60 sec  hold  ASSESSMENT:  CLINICAL IMPRESSION: Patient is a 53 y.o. white female who was seen today for physical therapy evaluation and treatment for chronic left sided neck pain. She demonstrates signs and symptoms of cervical arthritis with decreased cervical ROM and pain with movement. She shows signs and symptoms of left sided cervical radiculopathy with increased n/t radiating down into palmar surface of left hand from left side of neck and a positive Spurling's. Further testing needed to completely rule in cervical radiculopathy and PT unable to complete during session, but will complete next session. Her deficits include decreased cervical ROM, shoulder and periscapular strength and increased cervical pain. She will benefit from skilled PT to address these aforementioned deficits to be able to carry out UE tasks like self-grooming and home cleaning and cooking tasks without excessive pain and discomfort.    OBJECTIVE IMPAIRMENTS: decreased ROM, decreased strength, impaired sensation, impaired UE functional use, and pain.   ACTIVITY LIMITATIONS: carrying, lifting, bending, bathing, dressing, reach over head, and hygiene/grooming  PARTICIPATION LIMITATIONS: meal prep, cleaning, laundry, shopping, and community activity  PERSONAL FACTORS: Age, Education, Sex, Social background, Time since onset of injury/illness/exacerbation,  Transportation, and 3+ comorbidities: Cerebral Palsy, HTN, T2DM  are also affecting patient's functional outcome.   REHAB POTENTIAL: Fair chronicity of neck pain and high number of co-morbidities   CLINICAL DECISION MAKING: Stable/uncomplicated  EVALUATION COMPLEXITY: Low   GOALS: Goals reviewed with patient? No  SHORT TERM GOALS: Target date: 08/06/2023  Patient will demonstrate undestanding of home exercise plan by performing exercises correctly with evidence of good carry over with min to no verbal or tactile cues .   Baseline: NT  Goal status: INITIAL    LONG TERM GOALS: Target date: 10/02/2023   Patient will show a statistically significant improvement in her neck function as evidence by >=7.5 pt increase in her neck disability index score to better be able to move neck to improve visual field while driving to avoid accidents. (Young et al, 2009) Baseline: NT  Goal status: INITIAL  2.  Patient will demonstrate improved cervical AROM that are within 10% of normative values for improved cervical mobility and function for improved pain response to activity.  Baseline: Cervical Ext 25, Cervical Rot R/L 20*,  Goal status: INITIAL  3. Patient will demonstrate an improvement in her shoulder periscapular strength as evidenced by an increase >=1/3 grade MMT (4- to 4) for improved cervical spine stability and function to better be able to move neck to increase visual field by turning head to avoid accidents.            Baseline:             MMT Right eval Left eval  Shoulder flexion 4 4  Shoulder extension    Shoulder abduction 4 4  Shoulder adduction    Shoulder extension    Shoulder internal rotation 4 4  Shoulder external rotation 4- 4-  Mid Trap     Rhomboids     Lower Trap     Elbow flexion 4 4  Elbow extension    Wrist flexion 4 4  Wrist extension 4 4  Wrist ulnar deviation 4 4  Wrist radial deviation 4 4  Wrist pronation 4 4  Wrist supination 4 4   (Blank rows =  not tested) Grip Strength  Fair              Fair            Goal status: INITIAL      PLAN:  PT FREQUENCY: 1-2x/week  PT DURATION: 10 weeks  PLANNED INTERVENTIONS: 97164- PT Re-evaluation, 97110-Therapeutic exercises, 97530- Therapeutic activity, [id number]- Neuromuscular re-education, 97535- Self Care, 16109- Manual therapy, [phone]- Gait training, [phone]- Orthotic Fit/training, [phone]- Prosthetic training, [phone]- Aquatic Therapy, [phone]- Electrical stimulation (unattended), [phone]- Electrical stimulation (manual), [id number]- Traction (mechanical), Patient/Family education, Dry Needling, Joint mobilization, Joint manipulation, Spinal manipulation, Spinal mobilization, Cryotherapy, and Moist heat  PLAN FOR NEXT SESSION: NDI, grip strength and supine cervical motion. Cervical joint play testing. Cervical strength and periscapular strength testing. Progress cervical motion and periscapular strength exercises. Check on build a bike program.   Ellin Goodie PT, DPT  [facility] [address], [place], [address] Phone: [phone]   Fax:  [phone]

## 2023-07-24 ENCOUNTER — Telehealth: Payer: Self-pay | Admitting: Gastroenterology

## 2023-07-24 ENCOUNTER — Other Ambulatory Visit (INDEPENDENT_AMBULATORY_CARE_PROVIDER_SITE_OTHER): Admitting: Pharmacist

## 2023-07-24 ENCOUNTER — Telehealth: Payer: Self-pay | Admitting: Pharmacist

## 2023-07-24 DIAGNOSIS — K219 Gastro-esophageal reflux disease without esophagitis: Secondary | ICD-10-CM

## 2023-07-24 DIAGNOSIS — I10 Essential (primary) hypertension: Secondary | ICD-10-CM

## 2023-07-24 DIAGNOSIS — Z794 Long term (current) use of insulin: Secondary | ICD-10-CM

## 2023-07-24 DIAGNOSIS — E1169 Type 2 diabetes mellitus with other specified complication: Secondary | ICD-10-CM

## 2023-07-24 NOTE — Telephone Encounter (Signed)
 Patient states all she needs is to make a appointment. Called patient back and made her appointment for 09/04/2023

## 2023-07-24 NOTE — Telephone Encounter (Signed)
 Pt requesting call back has question in ref to call she received about her medication

## 2023-07-24 NOTE — Telephone Encounter (Signed)
 Thank you. Med list printed and ready to be faxed to [facility].  Saralyn Pilar, DO [facility] 07/24/2023, 4:27 PM

## 2023-07-24 NOTE — Telephone Encounter (Signed)
 Successfully reached patient

## 2023-07-24 NOTE — Progress Notes (Signed)
 07/24/2023 Name: Traci Davis MRN: [id number] DOB: 11/10/1970  Chief Complaint  Patient presents with   Medication Management   Medication Adherence    Traci Davis is a 53 y.o. year old female who presented for a telephone visit.   They were referred to the pharmacist by their PCP for assistance in managing complex medication management.    Subjective:  Care Team: Primary Care Provider: Smitty Cords, DO ; Next Scheduled Visit: 09/24/2023 Neurology: Alphonzo Lemmings, MD Pain Management: Leanor Kail, MD  Psychiatrist: Jomarie Longs, MD GI Specialist: Toney Reil, MD  Pulmonologist: [facility] Juleen Starr  Medication Access/Adherence  Current Pharmacy:  [facility] [place], [place] - [address] [place] [address] Phone: [phone] Fax: [phone]   Patient reports affordability concerns with their medications: No  Patient reports access/transportation concerns to their pharmacy: No  Patient reports adherence concerns with their medications:  Yes    Reports that she currently takes her medications from weekly pillbox as filled by her daughter, but hoping to get started will pill packaging from [facility]  Diabetes:  Current medications:  - metformin ER 500 - 2 tablets twice daily - Lantus 34 units daily (Reports self-adjusted to current dose following direction from PCP)  Medications tried in the past/contraindications: Jardiance (yeast infection); history of pancreatitis   Patient using Dexcom G7 continuous glucose monitor Current glucose readings: recent morning fasting readings running ~150  Patient denies hypoglycemic s/sx including dizziness, shakiness, sweating.  Current physical activity: Currently participating in physical therapy twice weekly  Statin therapy: atorvastatin 20 mg daily   Hypertension:  Current medications:  - amlodipine 5 mg daily - lisinopril 20 mg daily -  furosemide 40 mg daily as needed for fluid  Patient has an automated, upper arm home BP cuff and wrist monitor  Denies checking home BP recently  Patient denies hypotensive s/sx including dizziness, lightheadedness.   Current physical activity: Currently participating in physical therapy twice weekly   Objective:  Lab Results  Component Value Date   HGBA1C 8.0 (A) 05/21/2023    Lab Results  Component Value Date   CREATININE 0.88 05/31/2023   BUN 12 05/31/2023   NA 139 05/31/2023   K 3.9 05/31/2023   CL 98 05/31/2023   CO2 27 05/31/2023    No results found for: "CHOL", "HDL", "LDLCALC", "LDLDIRECT", "TRIG", "CHOLHDL"  BP Readings from Last 3 Encounters:  05/31/23 120/70  05/21/23 130/78  05/13/23 138/80   Pulse Readings from Last 3 Encounters:  05/31/23 94  05/21/23 (!) 102  05/13/23 (!) 103     Medications Reviewed Today     Reviewed by Manuela Neptune, RPH-CPP (Pharmacist) on 07/24/23 at 1301  Med List Status: <None>   Medication Order Taking? Sig Documenting Provider Last Dose Status Informant  acetaminophen (TYLENOL) 325 MG tablet [id number] Yes Take 2 tablets (650 mg total) by mouth every 6 (six) hours as needed. Sharman Cheek, MD Taking Active   albuterol (PROVENTIL HFA;VENTOLIN HFA) 108 (90 BASE) MCG/ACT inhaler [id number]  Inhale 2 puffs into the lungs 4 (four) times daily as needed. For shortness of breath and/or wheezing [provider]  Active Self           Med Note Mitzie Na Jul 08, 2017  5:44 PM)    amLODipine (NORVASC) 5 MG tablet [id number] Yes Take 5 mg by mouth. [provider] Taking Active   atorvastatin (LIPITOR)  20 MG tablet [id number] Yes Take 1 tablet (20 mg total) by mouth daily. Smitty Cords, DO Taking Active   azelastine (ASTELIN) 0.1 % nasal spray [id number] Yes Place into both nostrils. [provider] Taking Active   Blood Glucose Monitoring Suppl (GLUCOCOM BLOOD GLUCOSE MONITOR)  DEVI [id number]  Test daily before all meals/snacks and once before bedtime. [provider]  Active Self  budesonide-formoterol (SYMBICORT) 80-4.5 MCG/ACT inhaler [id number] Yes Inhale 2 puffs up to 4 times daily as needed for symptoms of wheezing or shortness of breath. [provider] Taking Active   busPIRone (BUSPAR) 10 MG tablet [id number] Yes Take 1 tablet (10 mg total) by mouth 3 (three) times daily. Jomarie Longs, MD Taking Active   Continuous Glucose Sensor (DEXCOM G7 SENSOR) MISC [id number] Yes Apply Dexcom G7 sensor every 10 days. Type 2 Diabetes uncontrolled (E11.65) [provider] Taking Active   CREON 36000 units CPEP capsule [id number] Yes 3 capsules 3 (three) times daily with meals. [provider] Taking Active   donepezil (ARICEPT) 10 MG tablet [id number] Yes Take 10 mg by mouth at bedtime. [provider] Taking Active   estradiol (ESTRACE) 0.1 MG/GM vaginal cream [id number]  Place 1 Applicatorful vaginally 3 (three) times a week. [provider]  Active   FLUoxetine (PROZAC) 20 MG capsule [id number] Yes Take 1 capsule (20 mg total) by mouth daily. Take along with 40 mg daily - total of 60 mg daily Eappen, Levin Bacon, MD Taking Active   FLUoxetine (PROZAC) 40 MG capsule [id number] Yes TAKE 1 CAPSULE BY MOUTH ONCE DAILY . TAKE ALONG WITH 20MG  DAILY AS DIRECTED Jomarie Longs, MD Taking Active   furosemide (LASIX) 40 MG tablet [id number] Yes Take 40 mg by mouth daily as needed for fluid. Taking twice a week [provider] Taking Active Self  gabapentin (NEURONTIN) 300 MG capsule [id number] Yes Take 600 mg by mouth 3 (three) times daily. [provider] Taking Active   glucose blood test strip [id number]  USE TO TEST BLOOD SUGAR TWO TIMES A DAY [provider]  Active   GVOKE HYPOPEN 2-PACK 1 MG/0.2ML SOAJ [id number]  Inject 1 mg into the skin as needed (hypoglycemia). Karamalegos, Netta Neat, DO  Active    incobotulinumtoxinA [facility]) 100 units SOLR injection [id number] Yes Inject 100 Units into the muscle every 3 (three) months.  [provider] Taking Active Self           Med Note Ronney Asters, Dhyana Bastone A   Wed Jul 24, 2023 12:59 PM) Dolores Lory from [facility] Specialist  Incontinence Supply Disposable (PREVAIL BLADDER CONTROL PAD) MISC [id number]  Large incontinence pads to go into underwear [provider]  Active   insulin glargine (LANTUS) 100 UNIT/ML Solostar Pen [id number] Yes Inject 34 Units into the skin daily. [provider] Taking Active   Insulin Pen Needle (GLOBAL EASE INJECT PEN NEEDLES) 31G X 5 MM MISC [id number]  Use pen needle to inject insulin daily. Smitty Cords, DO  Active   ipratropium (ATROVENT) 0.06 % nasal spray [id number] No Place 2 sprays into both nostrils 4 (four) times daily. For up to 5-7 days then stop.  Patient not taking: Reported on 07/24/2023   Smitty Cords, DO Not Taking Active   lamoTRIgine (LAMICTAL) 25 MG tablet [id number] Yes Take 1 tablet (25 mg total) by mouth daily. Jomarie Longs, MD Taking Active   Lancets (FREESTYLE) lancets [id number]  Test daily before all meals/snacks [provider]  Active Self  lidocaine (LIDODERM)  5 % [id number] Yes Place 2 patches onto the skin daily. [provider] Taking Active   linaclotide Karlene Einstein) 72 MCG capsule [id number] Yes Take 72 mcg by mouth daily before breakfast. [provider] Taking Active   lisinopril (ZESTRIL) 20 MG tablet [id number] Yes Take 20 mg by mouth daily. [provider] Taking Active   metFORMIN (GLUCOPHAGE-XR) 500 MG 24 hr tablet [id number] Yes Take 1,000 mg by mouth 2 (two) times daily. [provider] Taking Active   methocarbamol (ROBAXIN) 500 MG tablet [id number] Yes Take 500 mg by mouth 3 (three) times daily. [provider] Taking Active   mirabegron ER (MYRBETRIQ) 50 MG TB24 tablet [id number] Yes Take 1 tablet  (50 mg total) by mouth daily. Loleta Chance, MD Taking Active   mirtazapine (REMERON) 15 MG tablet [id number] Yes Take 1.5 tablets (22.5 mg total) by mouth at bedtime. Jomarie Longs, MD Taking Active   montelukast (SINGULAIR) 10 MG tablet [id number] Yes Take 10 mg by mouth daily. [provider] Taking Active   naloxone [facility]) nasal spray 4 mg/0.1 mL [id number]   [provider]  Active   nystatin (MYCOSTATIN/NYSTOP) powder [id number] Yes Apply topically. [provider] Taking Active   omeprazole (PRILOSEC) 40 MG capsule [id number] Yes Take 1 capsule (40 mg total) by mouth in the morning and at bedtime. Smitty Cords, DO Taking Active   oxyCODONE-acetaminophen (PERCOCET) 10-325 MG tablet [id number] Yes Take 1 tablet by mouth 3 (three) times daily as needed. [provider] Taking Active   SUMAtriptan (IMITREX) 100 MG tablet [id number] Yes Take 100 mg by mouth as directed. As needed for migraines [provider] Taking Active   triamcinolone cream (KENALOG) 0.1 % [id number]  Apply 1 Application topically 3 (three) times daily as needed. [provider]  Active   XTAMPZA ER 13.5 MG C12A [id number] Yes Take 1 capsule by mouth 2 (two) times daily. [provider] Taking Active               Assessment/Plan:    Encourage patient to contact  GI to schedule follow up appointment.   Comprehensive medication review performed; medication list updated in electronic medical record  Will collaborate with office/PCP to request team fax medication list for patient to [facility] as requested to aid patient with getting setup for pill packaging   Diabetes: - Currently uncontrolled - Reviewed long term cardiovascular and renal outcomes of uncontrolled blood sugar - Reviewed goal A1c, goal fasting, and goal 2 hour post prandial glucose - Reviewed dietary modifications including importance of having regular well-balanced meals  and snacks throughout the day, while controlling carbohydrate portion sizes             Encourage patient to avoid consumption of sugary beverages - Recommend to continue to use Dexcom G7 CGM to monitor blood sugar/as feedback on dietary choices Patient to check glucose with fingerstick check when needed for symptoms and as back up to CGM. Patient to contact office if needed for readings outside of established parameters or symptoms   Hypertension: - Reviewed long term cardiovascular and renal outcomes of uncontrolled blood pressure - Recommend to monitor home blood pressure, keep log of results and have this record to review at upcoming medical appointments. Patient to contact provider office sooner if needed for readings outside of established parameters or symptoms    Follow Up Plan: Clinical Pharmacist will follow up with patient by telephone on 08/12/2023 at 1:30 PM   Estelle Grumbles,  PharmD, [facility] [phone]

## 2023-07-24 NOTE — Telephone Encounter (Signed)
 Faxed to the listed number this afternoon.

## 2023-07-24 NOTE — Patient Instructions (Signed)
 Goals Addressed             This Visit's Progress    Pharmacy Goals       Please follow up with [facility] at [phone] regarding getting setup for pill packaging   The goal A1c is less than 7%. This is the best way to reduce the risk of the long term complications of diabetes, including heart disease, kidney disease, eye disease, strokes, and nerve damage. An A1c of less than 7% corresponds with fasting sugars less than 130 and 2 hour after meal sugars less than 180.   Our goal bad cholesterol, or LDL, is less than 70 . This is why it is important to continue taking your atorvastatin.  Estelle Grumbles, PharmD, [facility] Clinical Pharmacist [facility] [phone]

## 2023-07-24 NOTE — Telephone Encounter (Signed)
 Completed medication review with patient today. She is hoping to get setup for pill packaging with [facility]. When you have a chance, would you mind faxing current medication list to Attn: Mandy at Fax: [phone]?  Thank you!  Gentry Fitz

## 2023-07-25 ENCOUNTER — Other Ambulatory Visit: Payer: Self-pay | Admitting: Family Medicine

## 2023-07-25 DIAGNOSIS — M545 Low back pain, unspecified: Secondary | ICD-10-CM

## 2023-07-25 DIAGNOSIS — R600 Localized edema: Secondary | ICD-10-CM

## 2023-07-25 DIAGNOSIS — K8689 Other specified diseases of pancreas: Secondary | ICD-10-CM

## 2023-07-25 MED ORDER — ATORVASTATIN CALCIUM 20 MG PO TABS: 20.0000 mg | ORAL_TABLET | Freq: Every day | ORAL | 3 refills | Status: DC

## 2023-07-25 MED ORDER — OMEPRAZOLE 40 MG PO CPDR: 40.0000 mg | DELAYED_RELEASE_CAPSULE | Freq: Two times a day (BID) | ORAL | 3 refills | Status: DC

## 2023-07-25 MED ORDER — METFORMIN HCL ER 500 MG PO TB24: 1000.0000 mg | ORAL_TABLET | Freq: Two times a day (BID) | ORAL | 3 refills | Status: DC

## 2023-07-25 MED ORDER — AMLODIPINE BESYLATE 5 MG PO TABS: 5.0000 mg | ORAL_TABLET | Freq: Every day | ORAL | 3 refills | Status: DC

## 2023-07-25 MED ORDER — LISINOPRIL 20 MG PO TABS: 20.0000 mg | ORAL_TABLET | Freq: Every day | ORAL | 3 refills | Status: AC

## 2023-07-25 NOTE — Addendum Note (Signed)
 Addended by: Smitty Cords on: 07/25/2023 01:40 PM   Modules accepted: Orders

## 2023-07-25 NOTE — Telephone Encounter (Unsigned)
 Copied from CRM [id number]. Topic: Clinical - Prescription Issue >> Jul 25, 2023 10:41 AM Gildardo Pounds wrote: Reason for CRM: Angelica Chessman with [facility] in [place] needs new prescriptions for patients medications. Callback number [phone]

## 2023-07-25 NOTE — Addendum Note (Signed)
 Addended by: Smitty Cords on: 07/25/2023 01:33 PM   Modules accepted: Orders

## 2023-07-25 NOTE — Telephone Encounter (Signed)
 FYI  Signed new E-script sent to [facility] for the following  Amlodipine Atorvastatin Lisinopril Metformin XR Omeprazole  The other meds are managed by other specialists.  Saralyn Pilar, DO [facility] 07/25/2023, 1:40 PM

## 2023-07-25 NOTE — Telephone Encounter (Signed)
 Copied from CRM [id number]. Topic: Clinical - Medication Refill >> Jul 25, 2023  3:02 PM Emylou G wrote: Most Recent Primary Care Visit:  Provider: Smitty Cords  Department: [facility]  Visit Type: HOSPITAL FU  Date: 05/31/2023  Medication: montelukast (SINGULAIR) 10 MG tablet   Has the patient contacted their pharmacy? Yes (Agent: If no, request that the patient contact the pharmacy for the refill. If patient does not wish to contact the pharmacy document the reason why and proceed with request.) (Agent: If yes, when and what did the pharmacy advise?)  Is this the correct pharmacy for this prescription? Yes If no, delete pharmacy and type the correct one.  This is the patient's preferred pharmacy:  [facility] - [place], [place] - [address] [place] [address] Phone: [phone] Fax: [phone]   Has the prescription been filled recently? No  Is the patient out of the medication? Yes  Has the patient been seen for an appointment in the last year OR does the patient have an upcoming appointment? Yes  Can we respond through MyChart? Yes  Agent: Please be advised that Rx refills may take up to 3 business days. We ask that you follow-up with your pharmacy.

## 2023-07-25 NOTE — Telephone Encounter (Signed)
 Copied from CRM [id number]. Topic: Clinical - Medication Refill >> Jul 25, 2023  2:50 PM Emylou G wrote: Most Recent Primary Care Visit:  Provider: Smitty Cords  Department: [facility]  Visit Type: HOSPITAL FU  Date: 05/31/2023  Medication: mandy w/[facility] furosemide (LASIX) 40 MG tablet CREON 36000 units CPEP capsule methocarbamol (ROBAXIN) 500 MG tablet   Has the patient contacted their pharmacy? Yes (Agent: If no, request that the patient contact the pharmacy for the refill. If patient does not wish to contact the pharmacy document the reason why and proceed with request.) (Agent: If yes, when and what did the pharmacy advise?) pharmacy called in  Is this the correct pharmacy for this prescription? Yes If no, delete pharmacy and type the correct one.  This is the patient's preferred pharmacy:  [facility] - [place], [place] - [address] [place] [address] Phone: [phone] Fax: [phone]   Has the prescription been filled recently? No  Is the patient out of the medication? Yes  Has the patient been seen for an appointment in the last year OR does the patient have an upcoming appointment? Yes  Can we respond through MyChart? Yes  Agent: Please be advised that Rx refills may take up to 3 business days. We ask that you follow-up with your pharmacy.

## 2023-07-26 MED ORDER — MONTELUKAST SODIUM 10 MG PO TABS: 10.0000 mg | ORAL_TABLET | Freq: Every day | ORAL | 3 refills | Status: DC

## 2023-07-26 MED ORDER — CREON 36000-114000 UNITS PO CPEP: 108000.0000 [IU] | ORAL_CAPSULE | Freq: Three times a day (TID) | ORAL | 5 refills | Status: DC

## 2023-07-26 NOTE — Telephone Encounter (Signed)
 Requested medication (s) are due for refill today - unsure  Requested medication (s) are on the active medication list -yes  Future visit scheduled -yes  Last refill: all requested medications are listed as historical medications/provider- will all need Provider review for refill   Notes to clinic: see above  Requested Prescriptions  Pending Prescriptions Disp Refills   furosemide (LASIX) 40 MG tablet 30 tablet     Sig: Take 1 tablet (40 mg total) by mouth daily as needed for fluid. Taking twice a week     Cardiovascular:  Diuretics - Loop Failed - 07/26/2023  3:46 PM      Failed - Mg Level in normal range and within 180 days    No results found for: "MG"       Failed - Valid encounter within last 6 months    Recent Outpatient Visits   None     Future Appointments             In 1 month Vanga, Loel Dubonnet, MD [facility] [place] [facility] at [place]   In 2 months Althea Charon, Netta Neat, DO [place] [facility], [facility]            Passed - K in normal range and within 180 days    Potassium  Date Value Ref Range Status  05/31/2023 3.9 3.5 - 5.3 mmol/L Final  04/29/2014 3.8 3.5 - 5.1 mmol/L Final         Passed - Ca in normal range and within 180 days    Calcium  Date Value Ref Range Status  05/31/2023 9.1 8.6 - 10.4 mg/dL Final   Calcium, Total  Date Value Ref Range Status  04/29/2014 8.5 8.5 - 10.1 mg/dL Final         Passed - Na in normal range and within 180 days    Sodium  Date Value Ref Range Status  05/31/2023 139 135 - 146 mmol/L Final  04/29/2014 134 (L) 136 - 145 mmol/L Final         Passed - Cr in normal range and within 180 days    Creat  Date Value Ref Range Status  05/31/2023 0.88 0.50 - 1.03 mg/dL Final         Passed - Cl in normal range and within 180 days    Chloride  Date Value Ref Range Status  05/31/2023 98 98 - 110 mmol/L Final  04/29/2014 103 98 - 107 mmol/L Final         Passed - Last BP in normal  range    BP Readings from Last 1 Encounters:  05/31/23 120/70          CREON 36000-114000 units CPEP capsule 180 capsule     Sig: 3 capsules (108,000 Units total) 3 (three) times daily with meals.     Off-Protocol Failed - 07/26/2023  3:46 PM      Failed - Medication not assigned to a protocol, review manually.      Failed - Valid encounter within last 12 months    Recent Outpatient Visits   None     Future Appointments             In 1 month Vanga, Loel Dubonnet, MD [facility] [place] [facility] at [place]   In 2 months Althea Charon, Netta Neat, DO [place] [facility], [facility]             methocarbamol (ROBAXIN) 500 MG tablet  Sig: Take 1 tablet (500 mg total) by mouth 3 (three) times daily.     Not Delegated - Analgesics:  Muscle Relaxants Failed - 07/26/2023  3:46 PM      Failed - This refill cannot be delegated      Failed - Valid encounter within last 6 months    Recent Outpatient Visits   None     Future Appointments             In 1 month Vanga, Loel Dubonnet, MD [facility] [place] [facility]astroenterology at [place]   In 2 months Althea Charon, Netta Neat, DO [place] [facility], [facility]             montelukast (SINGULAIR) 10 MG tablet      Sig: Take 1 tablet (10 mg total) by mouth daily.     Pulmonology:  Leukotriene Inhibitors Failed - 07/26/2023  3:46 PM      Failed - Valid encounter within last 12 months    Recent Outpatient Visits   None     Future Appointments             In 1 month Vanga, Loel Dubonnet, MD [facility] [place] [facility] at [place]   In 2 months Althea Charon, Netta Neat, DO [place] [facility], [facility]               Requested Prescriptions  Pending Prescriptions Disp Refills   furosemide (LASIX) 40 MG tablet 30 tablet     Sig: Take 1 tablet (40 mg total) by mouth daily as needed for fluid. Taking twice a week     Cardiovascular:  Diuretics -  Loop Failed - 07/26/2023  3:46 PM      Failed - Mg Level in normal range and within 180 days    No results found for: "MG"       Failed - Valid encounter within last 6 months    Recent Outpatient Visits   None     Future Appointments             In 1 month Vanga, Loel Dubonnet, MD [facility] [place] [facility]ology at [place]   In 2 months Althea Charon, Netta Neat, DO [place] [facility], [facility]            Passed - K in normal range and within 180 days    Potassium  Date Value Ref Range Status  05/31/2023 3.9 3.5 - 5.3 mmol/L Final  04/29/2014 3.8 3.5 - 5.1 mmol/L Final         Passed - Ca in normal range and within 180 days    Calcium  Date Value Ref Range Status  05/31/2023 9.1 8.6 - 10.4 mg/dL Final   Calcium, Total  Date Value Ref Range Status  04/29/2014 8.5 8.5 - 10.1 mg/dL Final         Passed - Na in normal range and within 180 days    Sodium  Date Value Ref Range Status  05/31/2023 139 135 - 146 mmol/L Final  04/29/2014 134 (L) 136 - 145 mmol/L Final         Passed - Cr in normal range and within 180 days    Creat  Date Value Ref Range Status  05/31/2023 0.88 0.50 - 1.03 mg/dL Final         Passed - Cl in normal range and within 180 days    Chloride  Date Value Ref Range Status  05/31/2023  98 98 - 110 mmol/L Final  04/29/2014 103 98 - 107 mmol/L Final         Passed - Last BP in normal range    BP Readings from Last 1 Encounters:  05/31/23 120/70          CREON 36000-114000 units CPEP capsule 180 capsule     Sig: 3 capsules (108,000 Units total) 3 (three) times daily with meals.     Off-Protocol Failed - 07/26/2023  3:46 PM      Failed - Medication not assigned to a protocol, review manually.      Failed - Valid encounter within last 12 months    Recent Outpatient Visits   None     Future Appointments             In 1 month Vanga, Loel Dubonnet, MD [facility] [place] [facility]astroenterology at [place]   In 2  months Althea Charon, Netta Neat, DO [place] [facility], [facility]             methocarbamol (ROBAXIN) 500 MG tablet      Sig: Take 1 tablet (500 mg total) by mouth 3 (three) times daily.     Not Delegated - Analgesics:  Muscle Relaxants Failed - 07/26/2023  3:46 PM      Failed - This refill cannot be delegated      Failed - Valid encounter within last 6 months    Recent Outpatient Visits   None     Future Appointments             In 1 month Vanga, Loel Dubonnet, MD [facility] [place] [facility]astroenterology at [place]   In 2 months Althea Charon, Netta Neat, DO [place] [facility], [facility]             montelukast (SINGULAIR) 10 MG tablet      Sig: Take 1 tablet (10 mg total) by mouth daily.     Pulmonology:  Leukotriene Inhibitors Failed - 07/26/2023  3:46 PM      Failed - Valid encounter within last 12 months    Recent Outpatient Visits   None     Future Appointments             In 1 month Vanga, Loel Dubonnet, MD [facility] at [place]   In 2 months Althea Charon, Netta Neat, DO [place] [facility], [facility]

## 2023-07-26 NOTE — Telephone Encounter (Signed)
 Gentry Fitz, would you be able to review this further. I have sent the rx that I am able to already for her pill pack through [facility] on 07/25/23.  Some of these requested meds now methocarbamol and furosemide are AS NEEDED, and I am unsure an effective way to order AS NEEDED meds as part of pill pack based on instructions.  Thank you!  Saralyn Pilar, DO [facility] 07/26/2023, 5:39 PM

## 2023-07-26 NOTE — Telephone Encounter (Signed)
 Notes to clinic: Duplicate request- methocarbamol and montelukast- may have been addressed by pharmacist in office  Requested Prescriptions  Pending Prescriptions Disp Refills   furosemide (LASIX) 40 MG tablet 30 tablet     Sig: Take 1 tablet (40 mg total) by mouth daily as needed for fluid. Taking twice a week     Cardiovascular:  Diuretics - Loop Failed - 07/26/2023  3:51 PM      Failed - Mg Level in normal range and within 180 days    No results found for: "MG"       Failed - Valid encounter within last 6 months    Recent Outpatient Visits   None     Future Appointments             In 1 month Vanga, Loel Dubonnet, MD [facility] [place] [facility]astro[facility]ology at [place]   In 2 months Althea Charon, Netta Neat, DO [place] [facility], [facility]            Passed - K in normal range and within 180 days    Potassium  Date Value Ref Range Status  05/31/2023 3.9 3.5 - 5.3 mmol/L Final  04/29/2014 3.8 3.5 - 5.1 mmol/L Final         Passed - Ca in normal range and within 180 days    Calcium  Date Value Ref Range Status  05/31/2023 9.1 8.6 - 10.4 mg/dL Final   Calcium, Total  Date Value Ref Range Status  04/29/2014 8.5 8.5 - 10.1 mg/dL Final         Passed - Na in normal range and within 180 days    Sodium  Date Value Ref Range Status  05/31/2023 139 135 - 146 mmol/L Final  04/29/2014 134 (L) 136 - 145 mmol/L Final         Passed - Cr in normal range and within 180 days    Creat  Date Value Ref Range Status  05/31/2023 0.88 0.50 - 1.03 mg/dL Final         Passed - Cl in normal range and within 180 days    Chloride  Date Value Ref Range Status  05/31/2023 98 98 - 110 mmol/L Final  04/29/2014 103 98 - 107 mmol/L Final         Passed - Last BP in normal range    BP Readings from Last 1 Encounters:  05/31/23 120/70          CREON 36000-114000 units CPEP capsule 180 capsule     Sig: 3 capsules (108,000 Units total) 3 (three) times daily with  meals.     Off-Protocol Failed - 07/26/2023  3:51 PM      Failed - Medication not assigned to a protocol, review manually.      Failed - Valid encounter within last 12 months    Recent Outpatient Visits   None     Future Appointments             In 1 month Vanga, Loel Dubonnet, MD [facility] [place] [facility]astro[facility]ology at [place]   In 2 months Althea Charon, Netta Neat, DO [place] [facility], [facility]             methocarbamol (ROBAXIN) 500 MG tablet      Sig: Take 1 tablet (500 mg total) by mouth 3 (three) times daily.     Not Delegated - Analgesics:  Muscle Relaxants Failed - 07/26/2023  3:51 PM  Failed - This refill cannot be delegated      Failed - Valid encounter within last 6 months    Recent Outpatient Visits   None     Future Appointments             In 1 month Vanga, Loel Dubonnet, MD [facility] [place] [facility]astro[facility]ology at [place]   In 2 months Althea Charon, Netta Neat, DO [place] [facility], [facility]             montelukast (SINGULAIR) 10 MG tablet      Sig: Take 1 tablet (10 mg total) by mouth daily.     Pulmonology:  Leukotriene Inhibitors Failed - 07/26/2023  3:51 PM      Failed - Valid encounter within last 12 months    Recent Outpatient Visits   None     Future Appointments             In 1 month Vanga, Loel Dubonnet, MD [facility] [place] Gastroenterology at [place]   In 2 months Althea Charon, Netta Neat, DO [facility], [facility]               Requested Prescriptions  Pending Prescriptions Disp Refills   furosemide (LASIX) 40 MG tablet 30 tablet     Sig: Take 1 tablet (40 mg total) by mouth daily as needed for fluid. Taking twice a week     Cardiovascular:  Diuretics - Loop Failed - 07/26/2023  3:51 PM      Failed - Mg Level in normal range and within 180 days    No results found for: "MG"       Failed - Valid encounter within last 6 months    Recent  Outpatient Visits   None     Future Appointments             In 1 month Vanga, Loel Dubonnet, MD [facility] [place] [facility]astro[facility] at [place]   In 2 months Althea Charon, Netta Neat, DO [place] [facility], [facility]            Passed - K in normal range and within 180 days    Potassium  Date Value Ref Range Status  05/31/2023 3.9 3.5 - 5.3 mmol/L Final  04/29/2014 3.8 3.5 - 5.1 mmol/L Final         Passed - Ca in normal range and within 180 days    Calcium  Date Value Ref Range Status  05/31/2023 9.1 8.6 - 10.4 mg/dL Final   Calcium, Total  Date Value Ref Range Status  04/29/2014 8.5 8.5 - 10.1 mg/dL Final         Passed - Na in normal range and within 180 days    Sodium  Date Value Ref Range Status  05/31/2023 139 135 - 146 mmol/L Final  04/29/2014 134 (L) 136 - 145 mmol/L Final         Passed - Cr in normal range and within 180 days    Creat  Date Value Ref Range Status  05/31/2023 0.88 0.50 - 1.03 mg/dL Final         Passed - Cl in normal range and within 180 days    Chloride  Date Value Ref Range Status  05/31/2023 98 98 - 110 mmol/L Final  04/29/2014 103 98 - 107 mmol/L Final         Passed - Last BP in normal range    BP Readings from  Last 1 Encounters:  05/31/23 120/70          CREON 36000-114000 units CPEP capsule 180 capsule     Sig: 3 capsules (108,000 Units total) 3 (three) times daily with meals.     Off-Protocol Failed - 07/26/2023  3:51 PM      Failed - Medication not assigned to a protocol, review manually.      Failed - Valid encounter within last 12 months    Recent Outpatient Visits   None     Future Appointments             In 1 month Vanga, Loel Dubonnet, MD [facility] [place] [facility]astroenterology at [place]   In 2 months Althea Charon, Netta Neat, DO [place] [facility], [facility]             methocarbamol (ROBAXIN) 500 MG tablet      Sig: Take 1 tablet (500 mg total) by mouth 3  (three) times daily.     Not Delegated - Analgesics:  Muscle Relaxants Failed - 07/26/2023  3:51 PM      Failed - This refill cannot be delegated      Failed - Valid encounter within last 6 months    Recent Outpatient Visits   None     Future Appointments             In 1 month Vanga, Loel Dubonnet, MD [facility] [place] Gastro[facility]ology at [place]   In 2 months Althea Charon, Netta Neat, DO [place] [facility], [facility]             montelukast (SINGULAIR) 10 MG tablet      Sig: Take 1 tablet (10 mg total) by mouth daily.     Pulmonology:  Leukotriene Inhibitors Failed - 07/26/2023  3:51 PM      Failed - Valid encounter within last 12 months    Recent Outpatient Visits   None     Future Appointments             In 1 month Vanga, Loel Dubonnet, MD [facility] [place] [facility] at [place]   In 2 months Althea Charon, Netta Neat, DO [place] [facility], [facility]

## 2023-07-29 MED ORDER — FUROSEMIDE 40 MG PO TABS: 40.0000 mg | ORAL_TABLET | Freq: Every day | ORAL | 5 refills | Status: AC | PRN

## 2023-07-29 MED ORDER — METHOCARBAMOL 500 MG PO TABS: 500.0000 mg | ORAL_TABLET | Freq: Three times a day (TID) | ORAL | 5 refills | Status: DC | PRN

## 2023-07-29 NOTE — Telephone Encounter (Signed)
 Copied from CRM [id number]. Topic: Medical Record Request - Records Request >> Jul 29, 2023 10:49 AM Gildardo Pounds wrote: Reason for CRM: Baliely from [facility] calling about medical records request that was faxed on 07/25/2023. Please advise when they will be sent. Callback number is [phone]

## 2023-07-30 ENCOUNTER — Encounter: Payer: 59 | Admitting: Physical Therapy

## 2023-07-30 ENCOUNTER — Telehealth: Payer: Self-pay

## 2023-07-30 DIAGNOSIS — M79605 Pain in left leg: Secondary | ICD-10-CM | POA: Diagnosis not present

## 2023-07-30 DIAGNOSIS — M4722 Other spondylosis with radiculopathy, cervical region: Secondary | ICD-10-CM | POA: Diagnosis not present

## 2023-07-30 DIAGNOSIS — M79604 Pain in right leg: Secondary | ICD-10-CM | POA: Diagnosis not present

## 2023-07-30 DIAGNOSIS — M542 Cervicalgia: Secondary | ICD-10-CM | POA: Diagnosis not present

## 2023-07-30 DIAGNOSIS — G801 Spastic diplegic cerebral palsy: Secondary | ICD-10-CM | POA: Diagnosis not present

## 2023-07-30 NOTE — Telephone Encounter (Signed)
 Copied from CRM [id number]. Topic: General - Other >> Jul 30, 2023  1:02 PM Taleah C wrote: Reason for CRM: Misty [place] from [employer] called and explained that they have faxed a request for clinical notes to the doctor, and now the order is currently pending for cancellation because they made multiple attempts to receive clinical notes but they have not yet heard back. Please follow-up and advise at   CB#: [phone] , F: [phone].

## 2023-08-01 ENCOUNTER — Telehealth: Payer: Self-pay

## 2023-08-01 ENCOUNTER — Ambulatory Visit: Payer: 59 | Attending: Physical Medicine and Rehabilitation | Admitting: Physical Therapy

## 2023-08-01 DIAGNOSIS — M542 Cervicalgia: Secondary | ICD-10-CM | POA: Diagnosis not present

## 2023-08-01 DIAGNOSIS — R109 Unspecified abdominal pain: Secondary | ICD-10-CM

## 2023-08-01 DIAGNOSIS — K8689 Other specified diseases of pancreas: Secondary | ICD-10-CM

## 2023-08-01 DIAGNOSIS — R112 Nausea with vomiting, unspecified: Secondary | ICD-10-CM | POA: Diagnosis not present

## 2023-08-01 DIAGNOSIS — K760 Fatty (change of) liver, not elsewhere classified: Secondary | ICD-10-CM | POA: Diagnosis not present

## 2023-08-01 MED ORDER — CREON 36000-114000 UNITS PO CPEP: 108000.0000 [IU] | ORAL_CAPSULE | Freq: Three times a day (TID) | ORAL | 5 refills | Status: AC

## 2023-08-01 MED ORDER — DICYCLOMINE HCL 10 MG PO CAPS: ORAL_CAPSULE | ORAL | 5 refills | Status: DC

## 2023-08-01 NOTE — Telephone Encounter (Signed)
 Rx sent for both as requested.  Saralyn Pilar, DO [facility] 08/01/2023, 3:30 PM

## 2023-08-01 NOTE — Telephone Encounter (Signed)
 Copied from CRM [id number]. 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 The patients prescription for CREON 36000-114000 units CPEP capsule, should be written as 300 caps for a 30 day supply. And she states patient reports taking Dicyclomine(?() did not see it in medication list), 2 in the morning 1 in the evening and she needs the prescription to reflect that.

## 2023-08-01 NOTE — Therapy (Unsigned)
 OUTPATIENT PHYSICAL THERAPY CERVICAL TREATMENT    Patient Name: Traci Davis MRN: [id number] DOB:Apr 17, 1971, 53 y.o., female Today's Date: 08/01/2023  END OF SESSION:  PT End of Session - 08/01/23 1613     Visit Number 2    Number of Visits 20    Date for PT Re-Evaluation 10/01/23    Authorization Type [employer] Medicare Dual (VL based on mn)    Authorization - Visit Number 2    Authorization - Number of Visits 20    Progress Note Due on Visit 10    PT Start Time 1605    PT Stop Time 1645    PT Time Calculation (min) 40 min    Activity Tolerance Patient tolerated treatment well    Behavior During Therapy [facility] for tasks assessed/performed             Past Medical History:  Diagnosis Date   Acute pancreatitis 09/04/2014   Anxiety    Arthritis    Asthma    Cerebral palsy (HCC)    Complication of anesthesia    panic attacks before surgery   COPD (chronic obstructive pulmonary disease) (HCC)    Depression    Diabetes mellitus without complication (HCC)    GERD (gastroesophageal reflux disease)    Hypertension    Noninfectious gastroenteritis and colitis 01/10/2013   Past Surgical History:  Procedure Laterality Date   CESAREAN SECTION  1995   CHOLECYSTECTOMY     DILATATION & CURETTAGE/HYSTEROSCOPY WITH MYOSURE N/A 02/20/2018   Procedure: DILATATION & CURETTAGE/HYSTEROSCOPY WITH MYOSURE ENDOMETRIAL POLYPECTOMY;  Surgeon: Conard Novak, MD;  Location: [facility] ORS;  Service: Gynecology;  Laterality: N/A;   ESOPHAGOGASTRODUODENOSCOPY (EGD) WITH PROPOFOL N/A 01/01/2019   Procedure: ESOPHAGOGASTRODUODENOSCOPY (EGD) WITH PROPOFOL;  Surgeon: Toney Reil, MD;  Location: [facility] ENDOSCOPY;  Service: Gastroenterology;  Laterality: N/A;   FOOT CAPSULE RELEASE W/ PERCUTANEOUS HEEL CORD LENGTHENING, TIBIAL TENDON TRANSFER     age 79 ,and 2010-both legs   JOINT REPLACEMENT Right    both knees partial replacements dates unknown   knee replacment     x 5 per pt. last one- left knee  2014. has had 2 on R 3 on L   TYMPANOPLASTY WITH GRAFT Right 01/08/2018   Procedure: TYMPANOPLASTY WITH POSSIBLE OSSICULAR CHAIN RECONSTRUCTION;  Surgeon: Geanie Logan, MD;  Location: [facility] ORS;  Service: ENT;  Laterality: Right;   Patient Active Problem List   Diagnosis Date Noted   Urge urinary incontinence 04/02/2023   Incontinence of feces 04/02/2023   Vaginal atrophy 04/02/2023   Asthma, persistent controlled 02/06/2023   Continuous leakage of urine 01/04/2023   De Quervain's tenosynovitis, right 10/08/2022   Hot flashes due to menopause 07/17/2022   Convulsions (HCC) 07/09/2022   Obesity due to excess calories 09/22/2020   Insomnia due to medical condition 05/11/2020   Hamstring tendonitis 01/29/2020   MDD (major depressive disorder), recurrent, in full remission (HCC) 08/12/2019   Seizure-like activity (HCC) 07/08/2019   Mild episode of recurrent major depressive disorder (HCC) 12/29/2018   Insomnia due to mental condition 12/29/2018   Disorder of vein 03/04/2018   Lumbar sprain 03/04/2018   Muscle weakness 03/04/2018   Neck sprain 03/04/2018   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   Postmenopausal bleeding 02/18/2018   Impingement syndrome of shoulder region 02/04/2018   Chest pain with moderate risk for cardiac etiology 05/19/2017   GAD (generalized anxiety disorder) 04/15/2017   Chronic daily headache 04/15/2017   Panic attack 04/15/2017   Nocturnal enuresis 04/15/2017  Mild cognitive impairment 04/15/2017   Loss of memory 01/14/2017   Pain medication agreement signed 01/08/2017   Headache disorder 12/04/2016   Chronic, continuous use of opioids 07/01/2015   Vertigo 07/01/2015   Chronic midline low back pain without sciatica 03/21/2015   Type 2 diabetes mellitus with other specified complication (HCC) 09/05/2014   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   Hip pain, chronic 04/28/2013   Essential hypertension 01/10/2013   Irritable colon 01/10/2013   Encounter  for long-term (current) use of other medications 12/04/2012   Chronic GERD 08/12/2012   Diarrhea 08/12/2012   Primary localized osteoarthrosis, lower leg 05/07/2012   Difficulty walking 02/06/2012    PCP: Dr. Saralyn Pilar   REFERRING PROVIDER: Dr. Merrily Brittle   REFERRING DIAG: Cervicalgia   THERAPY DIAG:  Cervicalgia  Rationale for Evaluation and Treatment: Rehabilitation  ONSET DATE: 2019 it started   SUBJECTIVE:                                                                                                                                                                                                         SUBJECTIVE STATEMENT: Pt reports that her neck pain has decreased since last session and she thinks it is due to the cervical injections actually taking effect.  Hand dominance: Right  PERTINENT HISTORY:  Neck pain started around 5 years ago and it has occurred on her right side that is affected by cerebral palsy. She started having cervical radicular pain after receiving the most recent injection on February 12th. The pain travels down from her neck into the palm of her hand. She describes feeling numbness and pain. She went to [facility] for neuro   PAIN:  Are you having pain? Yes: NPRS scale: 7/10  Pain location: Right upper trap and radiates down lateral side of arm into palm of hand  Pain description: Sharp and shooting pain  Aggravating factors: Lifting phone up   Relieving factors: Heating pad   PRECAUTIONS: None  RED FLAGS: None     WEIGHT BEARING RESTRICTIONS: No  FALLS:  Has patient fallen in last 6 months? No  LIVING ENVIRONMENT: Lives with: lives with their partner Lives in: House/apartment Stairs: No Has following equipment at home: Dan Humphreys - 2 wheeled, Wheelchair (manual), shower chair, and Grab bars  OCCUPATION: None   PLOF: Independent  PATIENT GOALS: Decrease pain   NEXT MD VISIT: May 14th    OBJECTIVE:  Note: Objective measures were completed at Evaluation unless otherwise noted.  VITALS BP 130/77 HR 90 SpO22  DIAGNOSTIC FINDINGS:  None listed for cervical spine (Per pt's [facility] report - with left sided narrowing of foramin nerve compression at C6-C7  PATIENT SURVEYS:  NDI NT   COGNITION: Overall cognitive status: Within functional limits for tasks assessed  SENSATION: Decreased sensation in RUE   POSTURE: rounded shoulders  PALPATION: Right upper trap tender to palpate    CERVICAL ROM:   Active ROM A/PROM (deg) eval  Flexion 45  Extension 25/45  Right lateral flexion 20  Left lateral flexion 20  Right rotation 55*  Left rotation 55   (Blank rows = not tested)           UPPER EXTREMITY ROM:  AROM Right eval Left eval  Shoulder flexion    Shoulder extension    Shoulder abduction    Shoulder adduction    Shoulder extension    Shoulder internal rotation    Shoulder external rotation    Middle trapezius    Lower trapezius    Elbow flexion    Elbow extension    Wrist flexion    Wrist extension    Wrist ulnar deviation    Wrist radial deviation    Wrist pronation    Wrist supination    Grip strength     (Blank rows = not tested)  Combined Shoulder ER R/L NT  Combined Shoulder IR R/L NT   UPPER EXTREMITY MMT:  MMT Right eval Left eval  Shoulder flexion 4 4  Shoulder extension    Shoulder abduction 4 4  Shoulder adduction    Shoulder extension    Shoulder internal rotation 4 4  Shoulder external rotation 4- 4-  Elbow flexion 4 4  Elbow extension    Wrist flexion 4 4  Wrist extension 4 4  Wrist ulnar deviation 4 4  Wrist radial deviation 4 4  Wrist pronation 4 4  Wrist supination 4 4   (Blank rows = not tested) Grip Strength                                     Fair              Fair    CERVICAL SPECIAL TESTS:  Upper limb tension test (ULTT): NT and Distraction test: NT  Spurling's : Positive on Left     TREATMENT DATE:   08/01/23:  Grant Fontana UBE with seat at 8 with resistance at 2 -2.5 min forward and 2.5 min backward  NDI: 38/50 (76%)  All Exercises performed on occipital float   Supine Cervical Rot AROM 3 x 10  Supine Cervical Flex and Ext AROM 3 x 10   All Exercises performed on occipital float with use of TENS  -Waveform-Inferential  -Frequency 80/150   -Volts CV 9.0 9.0 with box pattern  Supine Cervical Rot AROM 3 x 10  Supine Cervical Flex and Ext AROM 3 x 10  -Pt reports decreased right sided neck pain Supine Chin Tuck 3 sec hold 2 x 10    07/23/23:  Upper trap stretch 2 x 60 sec  -Pt reports increased pain when stretching to left side    PATIENT EDUCATION:  Education details: Form and technique for correct performance of exercise.  Person educated: Patient Education method: Explanation, Verbal cues, and Handouts Education comprehension: verbalized understanding, returned demonstration, verbal cues required, and tactile cues required  HOME EXERCISE PROGRAM: Access Code: [email or web address] URL: [email or web address] Date: 08/01/2023 Prepared by: Ellin Goodie  Exercises - Seated Upper Trapezius Stretch  - 1 x daily - 7 x weekly - 3 reps - 30-60 sec  hold - Supine Cervical Rotation AROM on Flat Ball  - 1 x daily - 7 x weekly - 3 sets - 10 reps - Supine Chin Tucks on Flat Ball  - 1 x daily - 7 x weekly - 3 sets - 10 reps - 3 sec hold  ASSESSMENT:  CLINICAL IMPRESSION: Pt presents for initial treatment for ongoing chronic right sided neck pain. Pt was limited by pain during the session, but she was able to tolerate with use of TENS when completing gentle ROM exercises with head float. She will benefit from ongoing use of TENS to best perform exercises by reducing pain with movement. PT will research who needs to submit to need for device  to pt's medical insurance company. She will continue to benefit from skilled PT to address these aforementioned deficits to be able to carry out UE tasks like self-grooming and home cleaning and cooking tasks without excessive pain and discomfort.   OBJECTIVE IMPAIRMENTS: decreased ROM, decreased strength, impaired sensation, impaired UE functional use, and pain.   ACTIVITY LIMITATIONS: carrying, lifting, bending, bathing, dressing, reach over head, and hygiene/grooming  PARTICIPATION LIMITATIONS: meal prep, cleaning, laundry, shopping, and community activity  PERSONAL FACTORS: Age, Education, Sex, Social background, Time since onset of injury/illness/exacerbation, Transportation, and 3+ comorbidities: Cerebral Palsy, HTN, T2DM  are also affecting patient's functional outcome.   REHAB POTENTIAL: Fair chronicity of neck pain and high number of co-morbidities   CLINICAL DECISION MAKING: Stable/uncomplicated  EVALUATION COMPLEXITY: Low   GOALS: Goals reviewed with patient? No  SHORT TERM GOALS: Target date: 08/06/2023  Patient will demonstrate undestanding of home exercise plan by performing exercises correctly with evidence of good carry over with min to no verbal or tactile cues .   Baseline: NT 4/425: Performing exercises independently  Goal status: ACHIEVED       LONG TERM GOALS: Target date: 10/02/2023   Patient will show a statistically significant improvement in her neck function as evidence by >=7.5 pt increase in her neck disability index score to better be able to move neck to improve visual field while driving to avoid accidents. Maple Hudson et al, 2009) Baseline: 38/50 (76%) Goal status: ONGOING   2.  Patient will demonstrate improved cervical AROM that are within 10% of normative values for improved cervical mobility and function for improved pain response to activity.  Baseline: Cervical Ext 25, Cervical Rot R/L 20*,  Goal status: ONGOING   3. Patient will demonstrate an  improvement in her shoulder periscapular strength as evidenced by an increase >=1/3 grade MMT (4- to 4) for improved cervical spine stability and function to better be able to move neck to increase visual field by turning head to avoid accidents.            Baseline:             MMT Right eval Left eval  Shoulder flexion 4 4  Shoulder extension    Shoulder abduction  4 4  Shoulder adduction    Shoulder extension    Shoulder internal rotation 4 4  Shoulder external rotation 4- 4-  Mid Trap     Rhomboids     Lower Trap     Elbow flexion 4 4  Elbow extension    Wrist flexion 4 4  Wrist extension 4 4  Wrist ulnar deviation 4 4  Wrist radial deviation 4 4  Wrist pronation 4 4  Wrist supination 4 4   (Blank rows = not tested) Grip Strength                                     Fair              Fair            Goal status: ONGOING       PLAN:  PT FREQUENCY: 1-2x/week  PT DURATION: 10 weeks  PLANNED INTERVENTIONS: 97164- PT Re-evaluation, 97110-Therapeutic exercises, 97530- Therapeutic activity, 97112- Neuromuscular re-education, 97535- Self Care, 16109- Manual therapy, [id number]- Gait training, [phone]- Orthotic Fit/training, [id number]- Prosthetic training, [phone]- Aquatic Therapy, [phone]- Electrical stimulation (unattended), [phone]- Electrical stimulation (manual), [id number]- Traction (mechanical), Patient/Family education, Dry Needling, Joint mobilization, Joint manipulation, Spinal manipulation, Spinal mobilization, Cryotherapy, and Moist heat  PLAN FOR NEXT SESSION: Trigger Point Dry Needling. Cervical joint play testing. Progress cervical ROM exercises along with stretching and periscapular strengthening exercises.   Ellin Goodie PT, DPT  [facility] [address], [place], [address] Phone: [phone]   Fax:  [phone]

## 2023-08-01 NOTE — Addendum Note (Signed)
 Addended by: Smitty Cords on: 08/01/2023 03:30 PM   Modules accepted: Orders

## 2023-08-02 DIAGNOSIS — E119 Type 2 diabetes mellitus without complications: Secondary | ICD-10-CM | POA: Diagnosis not present

## 2023-08-02 DIAGNOSIS — E1165 Type 2 diabetes mellitus with hyperglycemia: Secondary | ICD-10-CM | POA: Diagnosis not present

## 2023-08-02 DIAGNOSIS — Z794 Long term (current) use of insulin: Secondary | ICD-10-CM | POA: Diagnosis not present

## 2023-08-02 LAB — LIPASE: Lipase: 12 U/L — ABNORMAL LOW (ref 14–72)

## 2023-08-06 ENCOUNTER — Ambulatory Visit: Payer: 59 | Admitting: Physical Therapy

## 2023-08-07 DIAGNOSIS — G801 Spastic diplegic cerebral palsy: Secondary | ICD-10-CM | POA: Diagnosis not present

## 2023-08-07 DIAGNOSIS — M4722 Other spondylosis with radiculopathy, cervical region: Secondary | ICD-10-CM | POA: Diagnosis not present

## 2023-08-08 ENCOUNTER — Ambulatory Visit: Payer: 59 | Admitting: Physical Therapy

## 2023-08-08 DIAGNOSIS — M542 Cervicalgia: Secondary | ICD-10-CM

## 2023-08-08 NOTE — Therapy (Signed)
 OUTPATIENT PHYSICAL THERAPY CERVICAL TREATMENT    Patient Name: Traci Davis MRN: [id number] DOB:05-06-1970, 53 y.o., female Today's Date: 08/08/2023  END OF SESSION:  PT End of Session - 08/08/23 1615     Visit Number 3    Number of Visits 20    Date for PT Re-Evaluation 10/01/23    Authorization Type [employer] Medicare Dual (VL based on mn)    Authorization - Visit Number 3    Authorization - Number of Visits 20    Progress Note Due on Visit 10    PT Start Time 1605    PT Stop Time 1645    PT Time Calculation (min) 40 min    Activity Tolerance Patient tolerated treatment well    Behavior During Therapy [facility] for tasks assessed/performed              Past Medical History:  Diagnosis Date   Acute pancreatitis 09/04/2014   Anxiety    Arthritis    Asthma    Cerebral palsy (HCC)    Complication of anesthesia    panic attacks before surgery   COPD (chronic obstructive pulmonary disease) (HCC)    Depression    Diabetes mellitus without complication (HCC)    GERD (gastroesophageal reflux disease)    Hypertension    Noninfectious gastroenteritis and colitis 01/10/2013   Past Surgical History:  Procedure Laterality Date   CESAREAN SECTION  1995   CHOLECYSTECTOMY     DILATATION & CURETTAGE/HYSTEROSCOPY WITH MYOSURE N/A 02/20/2018   Procedure: DILATATION & CURETTAGE/HYSTEROSCOPY WITH MYOSURE ENDOMETRIAL POLYPECTOMY;  Surgeon: Conard Novak, MD;  Location: [facility] ORS;  Service: Gynecology;  Laterality: N/A;   ESOPHAGOGASTRODUODENOSCOPY (EGD) WITH PROPOFOL N/A 01/01/2019   Procedure: ESOPHAGOGASTRODUODENOSCOPY (EGD) WITH PROPOFOL;  Surgeon: Toney Reil, MD;  Location: [facility] ENDOSCOPY;  Service: Gastroenterology;  Laterality: N/A;   FOOT CAPSULE RELEASE W/ PERCUTANEOUS HEEL CORD LENGTHENING, TIBIAL TENDON TRANSFER     age 35 ,and 2010-both legs   JOINT REPLACEMENT Right    both knees partial replacements dates unknown   knee replacment     x 5 per pt. last one- left  knee 2014. has had 2 on R 3 on L   TYMPANOPLASTY WITH GRAFT Right 01/08/2018   Procedure: TYMPANOPLASTY WITH POSSIBLE OSSICULAR CHAIN RECONSTRUCTION;  Surgeon: Geanie Logan, MD;  Location: [facility] ORS;  Service: ENT;  Laterality: Right;   Patient Active Problem List   Diagnosis Date Noted   Urge urinary incontinence 04/02/2023   Incontinence of feces 04/02/2023   Vaginal atrophy 04/02/2023   Asthma, persistent controlled 02/06/2023   Continuous leakage of urine 01/04/2023   De Quervain's tenosynovitis, right 10/08/2022   Hot flashes due to menopause 07/17/2022   Convulsions (HCC) 07/09/2022   Obesity due to excess calories 09/22/2020   Insomnia due to medical condition 05/11/2020   Hamstring tendonitis 01/29/2020   MDD (major depressive disorder), recurrent, in full remission (HCC) 08/12/2019   Seizure-like activity (HCC) 07/08/2019   Mild episode of recurrent major depressive disorder (HCC) 12/29/2018   Insomnia due to mental condition 12/29/2018   Disorder of vein 03/04/2018   Lumbar sprain 03/04/2018   Muscle weakness 03/04/2018   Neck sprain 03/04/2018   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   Postmenopausal bleeding 02/18/2018   Impingement syndrome of shoulder region 02/04/2018   Chest pain with moderate risk for cardiac etiology 05/19/2017   GAD (generalized anxiety disorder) 04/15/2017   Chronic daily headache 04/15/2017   Panic attack 04/15/2017   Nocturnal enuresis  04/15/2017   Mild cognitive impairment 04/15/2017   Loss of memory 01/14/2017   Pain medication agreement signed 01/08/2017   Headache disorder 12/04/2016   Chronic, continuous use of opioids 07/01/2015   Vertigo 07/01/2015   Chronic midline low back pain without sciatica 03/21/2015   Type 2 diabetes mellitus with other specified complication (HCC) 09/05/2014   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   Hip pain, chronic 04/28/2013   Essential hypertension 01/10/2013   Irritable colon 01/10/2013    Encounter for long-term (current) use of other medications 12/04/2012   Chronic GERD 08/12/2012   Diarrhea 08/12/2012   Primary localized osteoarthrosis, lower leg 05/07/2012   Difficulty walking 02/06/2012    PCP: Dr. Saralyn Pilar   REFERRING PROVIDER: Dr. Merrily Brittle   REFERRING DIAG: Cervicalgia   THERAPY DIAG:  Cervicalgia  Rationale for Evaluation and Treatment: Rehabilitation  ONSET DATE: 2019 it started   SUBJECTIVE:                                                                                                                                                                                                         SUBJECTIVE STATEMENT: Pt states that she has not had worsening right sided neck pain since receiving the shots. She reports that she gets dizzy and blurry vision when she turns her head especially to the right.  Hand dominance: Right  PERTINENT HISTORY:  Neck pain started around 5 years ago and it has occurred on her right side that is affected by cerebral palsy. She started having cervical radicular pain after receiving the most recent injection on February 12th. The pain travels down from her neck into the palm of her hand. She describes feeling numbness and pain. She went to [facility] for neuro   PAIN:  Are you having pain? Yes: NPRS scale: 7/10  Pain location: Right upper trap and radiates down lateral side of arm into palm of hand  Pain description: Sharp and shooting pain  Aggravating factors: Lifting phone up   Relieving factors: Heating pad   PRECAUTIONS: None  RED FLAGS: None     WEIGHT BEARING RESTRICTIONS: No  FALLS:  Has patient fallen in last 6 months? No  LIVING ENVIRONMENT: Lives with: lives with their partner Lives in: House/apartment Stairs: No Has following equipment at home: Dan Humphreys - 2 wheeled, Wheelchair (manual), shower chair, and Grab bars  OCCUPATION: None   PLOF: Independent  PATIENT  GOALS: Decrease pain   NEXT MD VISIT: May 14th   OBJECTIVE:  Note: Objective measures were  completed at Evaluation unless otherwise noted.  VITALS BP 130/77 HR 90 SpO22   DIAGNOSTIC FINDINGS:  None listed for cervical spine (Per pt's [facility] report - with left sided narrowing of foramin nerve compression at C6-C7  PATIENT SURVEYS:  NDI NT   COGNITION: Overall cognitive status: Within functional limits for tasks assessed  SENSATION: Decreased sensation in RUE   POSTURE: rounded shoulders  PALPATION: Right upper trap tender to palpate    CERVICAL ROM:   Active ROM A/PROM (deg) eval  Flexion 45  Extension 25/45  Right lateral flexion 20  Left lateral flexion 20  Right rotation 55*  Left rotation 55   (Blank rows = not tested)           UPPER EXTREMITY ROM:  AROM Right eval Left eval  Shoulder flexion    Shoulder extension    Shoulder abduction    Shoulder adduction    Shoulder extension    Shoulder internal rotation    Shoulder external rotation    Middle trapezius    Lower trapezius    Elbow flexion    Elbow extension    Wrist flexion    Wrist extension    Wrist ulnar deviation    Wrist radial deviation    Wrist pronation    Wrist supination    Grip strength     (Blank rows = not tested)  Combined Shoulder ER R/L NT  Combined Shoulder IR R/L NT   UPPER EXTREMITY MMT:  MMT Right eval Left eval  Shoulder flexion 4 4  Shoulder extension    Shoulder abduction 4 4  Shoulder adduction    Shoulder extension    Shoulder internal rotation 4 4  Shoulder external rotation 4- 4-  Elbow flexion 4 4  Elbow extension    Wrist flexion 4 4  Wrist extension 4 4  Wrist ulnar deviation 4 4  Wrist radial deviation 4 4  Wrist pronation 4 4  Wrist supination 4 4   (Blank rows = not tested) Grip Strength                                     Fair              Fair    CERVICAL SPECIAL TESTS:  Upper limb tension test (ULTT): NT and Distraction  test: NT  Spurling's : Positive on Left    TREATMENT DATE:   08/08/23: THEREX  UBE seat at 7- 2.5 forward and 2.5 backward   Scapular Retraction AROM 1 x 10  Seated Chin Tucks 1 x 10  -min VC to perform retraction instead of cervical flexion  Seated Shoulder Horizontal Abduction 2 x 10  *All supine exercises performed on occipital float* Supine Cervical Horizontal Rotations AROM 2 x 10  -Pt reports increased visual blurriness and dizziness  Supine Chin Tucks 2 x 10   PHYSICAL PERFORMANCE Head Impulse Test   -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   MANUAL THERAPY Upper trap massage and trigger point release -palpable trigger point on right upper trap is incredibly pain to touch for patient     08/01/23:  THEREX UBE with seat at 8 with resistance at 2 -2.5 min forward and 2.5 min backward  NDI: 38/50 (76%)  All Exercises performed on occipital float   Supine Cervical Rot AROM 3 x 10  Supine Cervical Flex and Ext AROM  3 x 10   All Exercises performed on occipital float with use of TENS  -Waveform-Inferential  -Frequency 80/150   -Volts CV 9.0 9.0 with box pattern  Supine Cervical Rot AROM 3 x 10  Supine Cervical Flex and Ext AROM 3 x 10  -Pt reports decreased right sided neck pain Supine Chin Tuck 3 sec hold 2 x 10    07/23/23:                                                                                                                              Upper trap stretch 2 x 60 sec  -Pt reports increased pain when stretching to left side    PATIENT EDUCATION:  Education details: Form and technique for correct performance of exercise.  Person educated: Patient Education method: Explanation, Verbal cues, and Handouts Education comprehension: verbalized understanding, returned demonstration, verbal cues required, and tactile cues required  HOME EXERCISE PROGRAM: Access Code: [email or web address] URL: [email or web address] Date: 08/08/2023 Prepared  by: Ellin Goodie  Exercises - Seated Upper Trapezius Stretch  - 1 x daily - 7 x weekly - 3 reps - 30-60 sec  hold - Seated Shoulder Horizontal Abduction - Thumbs Up  - 3-4 x weekly - 3 sets - 10 reps - Supine Cervical Rotation AROM on Flat Ball  - 1 x daily - 7 x weekly - 3 sets - 10 reps - Supine Chin Tucks on Flat Ball  - 1 x daily - 7 x weekly - 3 sets - 10 reps - 3 sec hold  ASSESSMENT:  CLINICAL IMPRESSION: Pt continues to be limited by ongoing right sided cervical radicular pain along with focal right upper trap pain. Pt does not want to do dry needling because of needle phobia. 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 Pt able to find her own TENS unit and she is able to find benefit in pain reduction which enables her to perform her exercises.  She will continue to benefit from skilled PT to address these aforementioned deficits to be able to carry out UE tasks like self-grooming and home cleaning and cooking tasks without excessive pain and discomfort.   OBJECTIVE IMPAIRMENTS: decreased ROM, decreased strength, impaired sensation, impaired UE functional use, and pain.   ACTIVITY LIMITATIONS: carrying, lifting, bending, bathing, dressing, reach over head, and hygiene/grooming  PARTICIPATION LIMITATIONS: meal prep, cleaning, laundry, shopping, and community activity  PERSONAL FACTORS: Age, Education, Sex, Social background, Time since onset of injury/illness/exacerbation, Transportation, and 3+ comorbidities: Cerebral Palsy, HTN, T2DM  are also affecting patient's functional outcome.   REHAB POTENTIAL: Fair chronicity of neck pain and high number of co-morbidities   CLINICAL DECISION MAKING: Stable/uncomplicated  EVALUATION COMPLEXITY: Low   GOALS: Goals reviewed with patient? No  SHORT TERM GOALS: Target date: 08/06/2023  Patient will demonstrate undestanding of home exercise plan by performing exercises correctly with evidence of  good carry over with min to no verbal or tactile cues .  Baseline: NT 4/425: Performing exercises independently  Goal status: ACHIEVED       LONG TERM GOALS: Target date: 10/02/2023   Patient will show a statistically significant improvement in her neck function as evidence by >=7.5 pt increase in her neck disability index score to better be able to move neck to improve visual field while driving to avoid accidents. Maple Hudson et al, 2009) Baseline: 38/50 (76%) Goal status: ONGOING   2.  Patient will demonstrate improved cervical AROM that are within 10% of normative values for improved cervical mobility and function for improved pain response to activity.  Baseline: Cervical Ext 25, Cervical Rot R/L 20*,  Goal status: ONGOING   3. Patient will demonstrate an improvement in her shoulder periscapular strength as evidenced by an increase >=1/3 grade MMT (4- to 4) for improved cervical spine stability and function to better be able to move neck to increase visual field by turning head to avoid accidents.            Baseline:             MMT Right eval Left eval  Shoulder flexion 4 4  Shoulder extension    Shoulder abduction 4 4  Shoulder adduction    Shoulder extension    Shoulder internal rotation 4 4  Shoulder external rotation 4- 4-  Mid Trap     Rhomboids     Lower Trap     Elbow flexion 4 4  Elbow extension    Wrist flexion 4 4  Wrist extension 4 4  Wrist ulnar deviation 4 4  Wrist radial deviation 4 4  Wrist pronation 4 4  Wrist supination 4 4   (Blank rows = not tested) Grip Strength                                     Fair              Fair            Goal status: ONGOING       PLAN:  PT FREQUENCY: 1-2x/week  PT DURATION: 10 weeks  PLANNED INTERVENTIONS: 97164- PT Re-evaluation, 97110-Therapeutic exercises, 97530- Therapeutic activity, 97112- Neuromuscular re-education, 97535- Self Care, 21308- Manual therapy, [id number]- Gait training, [phone]- Orthotic Fit/training,  [id number]- Prosthetic training, [phone]- Aquatic Therapy, [phone]- Electrical stimulation (unattended), [phone]- Electrical stimulation (manual), [id number]- Traction (mechanical), Patient/Family education, Dry Needling, Joint mobilization, Joint manipulation, Spinal manipulation, Spinal mobilization, Cryotherapy, and Moist heat  PLAN FOR NEXT SESSION: Eye exam using Snellen chart. Ongoing vestibular function testing: Finger Nose test for nystagmus. Ongoing progression of periscapular strength exercises: D2 Flexion and Extension in supine.   Ellin Goodie PT, DPT  [facility] [address] [place], [address] Phone: [phone]   Fax:  [phone]

## 2023-08-12 ENCOUNTER — Other Ambulatory Visit: Payer: Self-pay | Admitting: Family Medicine

## 2023-08-12 ENCOUNTER — Other Ambulatory Visit: Admitting: Pharmacist

## 2023-08-12 DIAGNOSIS — E1169 Type 2 diabetes mellitus with other specified complication: Secondary | ICD-10-CM

## 2023-08-12 DIAGNOSIS — Z794 Long term (current) use of insulin: Secondary | ICD-10-CM

## 2023-08-12 NOTE — Progress Notes (Signed)
 08/12/2023 Name: Traci Davis MRN: [id number] DOB: 01-07-1971  Chief Complaint  Patient presents with   Medication Management   Medication Adherence    Traci Davis is a 53 y.o. year old female who presented for a telephone visit.   They were referred to the pharmacist by their PCP for assistance in managing complex medication management.      Subjective:   Care Team: Primary Care Provider: Smitty Cords, DO ; Next Scheduled Visit: 08/26/2023 Neurology: Traci Lemmings, MD Pain Management: Traci Kail, MD  Psychiatrist: Jomarie Longs, MD GI Specialist: Traci Reil, MD  Pulmonologist: Traci Davis [facility] Traci Davis  Medication Access/Adherence  Current Pharmacy:  [facility] [place], [place] - [address] [place] [address] Phone: [phone] Fax: [phone]   Patient reports affordability concerns with their medications: No  Patient reports access/transportation concerns to their pharmacy: No  Patient reports adherence concerns with their medications:  No     Reports that she currently takes her medications from pill packaging from Traci Davis, but is planning to switch back to using weekly pillbox as filled by her daughter due to the fee for pill packaging   Diabetes:   Current medications:  - metformin ER 500 - 2 tablets twice daily - Lantus 34 units daily   Medications tried in the past/contraindications: Jardiance (yeast infection); history of pancreatitis    Patient using Dexcom G7 continuous glucose monitor Current glucose readings: recent morning fasting readings running ~200   Patient denies hypoglycemic s/sx including dizziness, shakiness, sweating.   Current physical activity: Doing neck exercises and exercises from physical therapy each evening   Statin therapy: atorvastatin 20 mg daily     Hypertension:   Current medications:  - amlodipine 5 mg daily - lisinopril 20 mg daily -  furosemide 40 mg daily as needed for fluid   Patient has an automated, upper arm home BP cuff and wrist monitor  Denies checking home BP recently   Patient denies hypotensive s/sx including dizziness, lightheadedness.    Current physical activity: Doing neck exercises and exercises from physical therapy each evening     Objective:  Lab Results  Component Value Date   HGBA1C 8.0 (A) 05/21/2023    Lab Results  Component Value Date   CREATININE 0.88 05/31/2023   BUN 12 05/31/2023   NA 139 05/31/2023   K 3.9 05/31/2023   CL 98 05/31/2023   CO2 27 05/31/2023   BP Readings from Last 3 Encounters:  05/31/23 120/70  05/21/23 130/78  05/13/23 138/80   Pulse Readings from Last 3 Encounters:  05/31/23 94  05/21/23 (!) 102  05/13/23 (!) 103    Current Outpatient Medications on File Prior to Visit  Medication Sig Dispense Refill   metFORMIN (GLUCOPHAGE-XR) 500 MG 24 hr tablet Take 2 tablets (1,000 mg total) by mouth 2 (two) times daily. 360 tablet 3   acetaminophen (TYLENOL) 325 MG tablet Take 2 tablets (650 mg total) by mouth every 6 (six) hours as needed. 60 tablet 0   albuterol (PROVENTIL HFA;VENTOLIN HFA) 108 (90 BASE) MCG/ACT inhaler Inhale 2 puffs into the lungs 4 (four) times daily as needed. For shortness of breath and/or wheezing     amLODipine (NORVASC) 5 MG tablet Take 1 tablet (5 mg total) by mouth daily. 90 tablet 3   atorvastatin (LIPITOR) 20 MG tablet Take 1 tablet (20 mg total) by mouth daily. 90 tablet 3   azelastine (ASTELIN)  0.1 % nasal spray Place into both nostrils.     Blood Glucose Monitoring Suppl (GLUCOCOM BLOOD GLUCOSE MONITOR) DEVI Test daily before all meals/snacks and once before bedtime.     budesonide-formoterol (SYMBICORT) 80-4.5 MCG/ACT inhaler Inhale 2 puffs up to 4 times daily as needed for symptoms of wheezing or shortness of breath.     busPIRone (BUSPAR) 10 MG tablet Take 1 tablet (10 mg total) by mouth 3 (three) times daily. 270 tablet 1    Continuous Glucose Sensor (DEXCOM G7 SENSOR) MISC Apply Dexcom G7 sensor every 10 days. Type 2 Diabetes uncontrolled (E11.65)     CREON 36000-114000 units CPEP capsule Take 3 capsules (108,000 Units total) by mouth 3 (three) times daily with meals. 300 capsule 5   dicyclomine (BENTYL) 10 MG capsule Take 2 capsules in morning with meal and 1 capsule in evening with meal. 90 capsule 5   donepezil (ARICEPT) 10 MG tablet Take 10 mg by mouth at bedtime.     estradiol (ESTRACE) 0.1 MG/GM vaginal cream Place 1 Applicatorful vaginally 3 (three) times a week.     FLUoxetine (PROZAC) 20 MG capsule Take 1 capsule (20 mg total) by mouth daily. Take along with 40 mg daily - total of 60 mg daily 90 capsule 1   FLUoxetine (PROZAC) 40 MG capsule TAKE 1 CAPSULE BY MOUTH ONCE DAILY . TAKE ALONG WITH 20MG  DAILY AS DIRECTED 90 capsule 1   furosemide (LASIX) 40 MG tablet Take 1 tablet (40 mg total) by mouth daily as needed for fluid. 30 tablet 5   gabapentin (NEURONTIN) 300 MG capsule Take 600 mg by mouth 3 (three) times daily.     glucose blood test strip USE TO TEST BLOOD SUGAR TWO TIMES A DAY     GVOKE HYPOPEN 2-PACK 1 MG/0.2ML SOAJ Inject 1 mg into the skin as needed (hypoglycemia). 1 mL 0   incobotulinumtoxinA (XEOMIN) 100 units SOLR injection Inject 100 Units into the muscle every 3 (three) months.      Incontinence Supply Disposable (PREVAIL BLADDER CONTROL PAD) MISC Large incontinence pads to go into underwear     insulin glargine (LANTUS) 100 UNIT/ML Solostar Pen Inject 34 Units into the skin daily.     Insulin Pen Needle (GLOBAL EASE INJECT PEN NEEDLES) 31G X 5 MM MISC Use pen needle to inject insulin daily. 100 each 0   lamoTRIgine (LAMICTAL) 25 MG tablet Take 1 tablet (25 mg total) by mouth daily. 90 tablet 1   Lancets (FREESTYLE) lancets Test daily before all meals/snacks     lidocaine (LIDODERM) 5 % Place 2 patches onto the skin daily.     linaclotide (LINZESS) 72 MCG capsule Take 72 mcg by mouth daily  before breakfast.     lisinopril (ZESTRIL) 20 MG tablet Take 1 tablet (20 mg total) by mouth daily. 90 tablet 3   methocarbamol (ROBAXIN) 500 MG tablet Take 1 tablet (500 mg total) by mouth 3 (three) times daily as needed for muscle spasms. 90 tablet 5   mirabegron ER (MYRBETRIQ) 50 MG TB24 tablet Take 1 tablet (50 mg total) by mouth daily. 30 tablet 3   mirtazapine (REMERON) 15 MG tablet Take 1.5 tablets (22.5 mg total) by mouth at bedtime. 135 tablet 1   montelukast (SINGULAIR) 10 MG tablet Take 1 tablet (10 mg total) by mouth at bedtime. 90 tablet 3   naloxone (NARCAN) nasal spray 4 mg/0.1 mL      nystatin (MYCOSTATIN/NYSTOP) powder Apply topically.     omeprazole (  PRILOSEC) 40 MG capsule Take 1 capsule (40 mg total) by mouth in the morning and at bedtime. 90 capsule 3   oxyCODONE-acetaminophen (PERCOCET) 10-325 MG tablet Take 1 tablet by mouth 3 (three) times daily as needed.     SUMAtriptan (IMITREX) 100 MG tablet Take 100 mg by mouth as directed. As needed for migraines     triamcinolone cream (KENALOG) 0.1 % Apply 1 Application topically 3 (three) times daily as needed.     XTAMPZA ER 13.5 MG C12A Take 1 capsule by mouth 2 (two) times daily.     No current facility-administered medications on file prior to visit.       Assessment/Plan:   Discuss importance of medication adherence. Patient plans to switch back to using weekly pillbox as filled by her daughter due to the fee for pill packaging   Diabetes: - Currently uncontrolled - Reviewed long term cardiovascular and renal outcomes of uncontrolled blood sugar - Reviewed goal A1c, goal fasting, and goal 2 hour post prandial glucose - Reviewed dietary modifications including importance of having regular well-balanced meals and snacks throughout the day, while controlling carbohydrate portion sizes             Encourage patient to avoid consumption of sugary beverages             Encourage patient to increase consumption of  non-starchy vegetables  - Patient interested in referral to dietitian for help with healthy meal planning for blood sugar control and weight control  Will collaborate with PCP - Patient to continue to follow direction from PCP from 05/21/2023 that she may "increase insulin Lantus from 30 up to + 2 units each week if fasting sugar is >150. Max dose is 40 units" - Recommend to continue to use Dexcom G7 CGM to monitor blood sugar/as feedback on dietary choices Patient to check glucose with fingerstick check when needed for symptoms and as back up to CGM. Patient to contact office if needed for readings outside of established parameters or symptoms     Hypertension: - Reviewed long term cardiovascular and renal outcomes of uncontrolled blood pressure - Recommend to monitor home blood pressure, keep log of results and have this record to review at upcoming medical appointments. Patient to contact provider office sooner if needed for readings outside of established parameters or symptoms       Follow Up Plan: Clinical Pharmacist will follow up with patient by telephone on 11/04/2023 at 1:30 PM    Arthur Lash, PharmD, [facility] [phone]

## 2023-08-12 NOTE — Patient Instructions (Signed)
 Goals Addressed             This Visit's Progress    Pharmacy Goals       The goal A1c is less than 7%. This is the best way to reduce the risk of the long term complications of diabetes, including heart disease, kidney disease, eye disease, strokes, and nerve damage. An A1c of less than 7% corresponds with fasting sugars less than 130 and 2 hour after meal sugars less than 180.   Our goal bad cholesterol, or LDL, is less than 70 . This is why it is important to continue taking your atorvastatin.  Arthur Lash, PharmD, [facility] Clinical Pharmacist [facility] [phone]

## 2023-08-13 ENCOUNTER — Ambulatory Visit: Payer: 59 | Admitting: Physical Therapy

## 2023-08-13 ENCOUNTER — Encounter: Payer: Self-pay | Admitting: Pharmacist

## 2023-08-15 ENCOUNTER — Ambulatory Visit: Payer: 59 | Admitting: Physical Therapy

## 2023-08-15 DIAGNOSIS — M542 Cervicalgia: Secondary | ICD-10-CM

## 2023-08-15 NOTE — Therapy (Signed)
 OUTPATIENT PHYSICAL THERAPY CERVICAL TREATMENT    Patient Name: Traci Davis MRN: [id number] DOB:1970-12-04, 53 y.o., female Today's Date: 08/15/2023  END OF SESSION:  PT End of Session - 08/15/23 1628     Visit Number 4    Number of Visits 20    Date for PT Re-Evaluation 10/01/23    Authorization Type [employer] Medicare Dual (VL based on mn)    Authorization - Visit Number 4    Authorization - Number of Visits 20    Progress Note Due on Visit 10    PT Start Time 1610    PT Stop Time 1650    PT Time Calculation (min) 40 min    Activity Tolerance Patient tolerated treatment well    Behavior During Therapy [facility] for tasks assessed/performed              Past Medical History:  Diagnosis Date   Acute pancreatitis 09/04/2014   Anxiety    Arthritis    Asthma    Cerebral palsy (HCC)    Complication of anesthesia    panic attacks before surgery   COPD (chronic obstructive pulmonary disease) (HCC)    Depression    Diabetes mellitus without complication (HCC)    GERD (gastroesophageal reflux disease)    Hypertension    Noninfectious gastroenteritis and colitis 01/10/2013   Past Surgical History:  Procedure Laterality Date   CESAREAN SECTION  1995   CHOLECYSTECTOMY     DILATATION & CURETTAGE/HYSTEROSCOPY WITH MYOSURE N/A 02/20/2018   Procedure: DILATATION & CURETTAGE/HYSTEROSCOPY WITH MYOSURE ENDOMETRIAL POLYPECTOMY;  Surgeon: Kris Pester, MD;  Location: [facility] ORS;  Service: Gynecology;  Laterality: N/A;   ESOPHAGOGASTRODUODENOSCOPY (EGD) WITH PROPOFOL N/A 01/01/2019   Procedure: ESOPHAGOGASTRODUODENOSCOPY (EGD) WITH PROPOFOL;  Surgeon: Selena Daily, MD;  Location: [facility]COPY;  Service: Gastroenterology;  Laterality: N/A;   FOOT CAPSULE RELEASE W/ PERCUTANEOUS HEEL CORD LENGTHENING, TIBIAL TENDON TRANSFER     age 71 ,and 2010-both legs   JOINT REPLACEMENT Right    both knees partial replacements dates unknown   knee replacment     x 5 per pt. last one- left  knee 2014. has had 2 on R 3 on L   TYMPANOPLASTY WITH GRAFT Right 01/08/2018   Procedure: TYMPANOPLASTY WITH POSSIBLE OSSICULAR CHAIN RECONSTRUCTION;  Surgeon: Von Grumbling, MD;  Location: [facility] ORS;  Service: ENT;  Laterality: Right;   Patient Active Problem List   Diagnosis Date Noted   Urge urinary incontinence 04/02/2023   Incontinence of feces 04/02/2023   Vaginal atrophy 04/02/2023   Asthma, persistent controlled 02/06/2023   Continuous leakage of urine 01/04/2023   De Quervain's tenosynovitis, right 10/08/2022   Hot flashes due to menopause 07/17/2022   Convulsions (HCC) 07/09/2022   Obesity due to excess calories 09/22/2020   Insomnia due to medical condition 05/11/2020   Hamstring tendonitis 01/29/2020   MDD (major depressive disorder), recurrent, in full remission (HCC) 08/12/2019   Seizure-like activity (HCC) 07/08/2019   Mild episode of recurrent major depressive disorder (HCC) 12/29/2018   Insomnia due to mental condition 12/29/2018   Disorder of vein 03/04/2018   Lumbar sprain 03/04/2018   Muscle weakness 03/04/2018   Neck sprain 03/04/2018   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   Postmenopausal bleeding 02/18/2018   Impingement syndrome of shoulder region 02/04/2018   Chest pain with moderate risk for cardiac etiology 05/19/2017   GAD (generalized anxiety disorder) 04/15/2017   Chronic daily headache 04/15/2017   Panic attack 04/15/2017   Nocturnal enuresis  04/15/2017   Mild cognitive impairment 04/15/2017   Loss of memory 01/14/2017   Pain medication agreement signed 01/08/2017   Headache disorder 12/04/2016   Chronic, continuous use of opioids 07/01/2015   Vertigo 07/01/2015   Chronic midline low back pain without sciatica 03/21/2015   Type 2 diabetes mellitus with other specified complication (HCC) 09/05/2014   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   Hip pain, chronic 04/28/2013   Essential hypertension 01/10/2013   Irritable colon 01/10/2013    Encounter for long-term (current) use of other medications 12/04/2012   Chronic GERD 08/12/2012   Diarrhea 08/12/2012   Primary localized osteoarthrosis, lower leg 05/07/2012   Difficulty walking 02/06/2012    PCP: Dr. Domingo Friend   REFERRING PROVIDER: Dr. Arlean Bellow   REFERRING DIAG: Cervicalgia   THERAPY DIAG:  Cervicalgia  Rationale for Evaluation and Treatment: Rehabilitation  ONSET DATE: 2019 it started   SUBJECTIVE:                                                                                                                                                                                                         SUBJECTIVE STATEMENT: Pt reports ongoing right upper trap tenderness and it is painful to the touch. She continues to feel increased pain in this area.  Hand dominance: Right  PERTINENT HISTORY:  Neck pain started around 5 years ago and it has occurred on her right side that is affected by cerebral palsy. She started having cervical radicular pain after receiving the most recent injection on February 12th. The pain travels down from her neck into the palm of her hand. She describes feeling numbness and pain. She went to [facility] for neuro   PAIN:  Are you having pain? Yes: NPRS scale: 7/10  Pain location: Right upper trap and radiates down lateral side of arm into palm of hand  Pain description: Sharp and shooting pain  Aggravating factors: Lifting phone up   Relieving factors: Heating pad   PRECAUTIONS: None  RED FLAGS: None     WEIGHT BEARING RESTRICTIONS: No  FALLS:  Has patient fallen in last 6 months? No  LIVING ENVIRONMENT: Lives with: lives with their partner Lives in: House/apartment Stairs: No Has following equipment at home: Otho Blitz - 2 wheeled, Wheelchair (manual), shower chair, and Grab bars  OCCUPATION: None   PLOF: Independent  PATIENT GOALS: Decrease pain   NEXT MD VISIT: May 14th    OBJECTIVE:  Note: Objective measures were completed at Evaluation unless otherwise noted.  VITALS BP 130/77 HR  90 SpO22   DIAGNOSTIC FINDINGS:  None listed for cervical spine (Per pt's [facility] report - with left sided narrowing of foramin nerve compression at C6-C7  PATIENT SURVEYS:  NDI NT   COGNITION: Overall cognitive status: Within functional limits for tasks assessed  SENSATION: Decreased sensation in RUE   POSTURE: rounded shoulders  PALPATION: Right upper trap tender to palpate    CERVICAL ROM:   Active ROM A/PROM (deg) eval  Flexion 45  Extension 25/45  Right lateral flexion 20  Left lateral flexion 20  Right rotation 55*  Left rotation 55   (Blank rows = not tested)           UPPER EXTREMITY ROM:  AROM Right eval Left eval  Shoulder flexion    Shoulder extension    Shoulder abduction    Shoulder adduction    Shoulder extension    Shoulder internal rotation    Shoulder external rotation    Middle trapezius    Lower trapezius    Elbow flexion    Elbow extension    Wrist flexion    Wrist extension    Wrist ulnar deviation    Wrist radial deviation    Wrist pronation    Wrist supination    Grip strength     (Blank rows = not tested)  Combined Shoulder ER R/L NT  Combined Shoulder IR R/L NT   UPPER EXTREMITY MMT:  MMT Right eval Left eval  Shoulder flexion 4 4  Shoulder extension    Shoulder abduction 4 4  Shoulder adduction    Shoulder extension    Shoulder internal rotation 4 4  Shoulder external rotation 4- 4-  Elbow flexion 4 4  Elbow extension    Wrist flexion 4 4  Wrist extension 4 4  Wrist ulnar deviation 4 4  Wrist radial deviation 4 4  Wrist pronation 4 4  Wrist supination 4 4   (Blank rows = not tested) Grip Strength                                     Fair              Fair    CERVICAL SPECIAL TESTS:  Upper limb tension test (ULTT): NT and Distraction test: NT  Spurling's : Positive on Left     TREATMENT DATE: Patient has a latex allergy    08/15/23  THEREX   Upper Trap Stretch with overpressure 4 x 60 sec  -mod VC to sequence exercise with increase left side rotation and side bend to stretch right upper trap  Seated Rows with Blue Theraband 2 x 10  Spurling's: Negative on RUE   MANUAL: Performed while using moist heat over upper traps   Right upper trap trigger point release and massage   Mid Trap and Rhomboid trigger point release and massage    [facility]  Use of theracane for trigger point release   -How to apply pressure and not to release until tension dissipates    08/08/23: THEREX  UBE seat at 7- 2.5 forward and 2.5 backward   Scapular Retraction AROM 1 x 10  Seated Chin Tucks 1 x 10  -min VC to perform retraction instead of cervical flexion  Seated Shoulder Horizontal Abduction 2 x 10  *All supine exercises performed on occipital float* Supine Cervical Horizontal Rotations AROM 2 x 10  -Pt  reports increased visual blurriness and dizziness  Supine Chin Tucks 2 x 10   PHYSICAL PERFORMANCE Head Impulse Test   -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   MANUAL THERAPY Upper trap massage and trigger point release -palpable trigger point on right upper trap is incredibly pain to touch for patient     08/01/23:  THEREX UBE with seat at 8 with resistance at 2 -2.5 min forward and 2.5 min backward  NDI: 38/50 (76%)  All Exercises performed on occipital float   Supine Cervical Rot AROM 3 x 10  Supine Cervical Flex and Ext AROM 3 x 10   All Exercises performed on occipital float with use of TENS  -Waveform-Inferential  -Frequency 80/150   -Volts CV 9.0 9.0 with box pattern  Supine Cervical Rot AROM 3 x 10  Supine Cervical Flex and Ext AROM 3 x 10  -Pt reports decreased right sided neck pain Supine Chin Tuck 3 sec hold 2 x 10    07/23/23:                                                                                                                               Upper trap stretch 2 x 60 sec  -Pt reports increased pain when stretching to left side    PATIENT EDUCATION:  Education details: Form and technique for correct performance of exercise.  Person educated: Patient Education method: Explanation, Verbal cues, and Handouts Education comprehension: verbalized understanding, returned demonstration, verbal cues required, and tactile cues required  HOME EXERCISE PROGRAM: Access Code: [email or web address] URL: [email or web address] Date: 08/15/2023 Prepared by: Ellin Goodie  Exercises - Seated Upper Trapezius Stretch  - 1 x daily - 7 x weekly - 3 reps - 30-60 sec  hold - Seated Shoulder Horizontal Abduction - Thumbs Up  - 3-4 x weekly - 3 sets - 10 reps - Supine Cervical Rotation AROM on Flat Ball  - 1 x daily - 7 x weekly - 3 sets - 10 reps - Supine Chin Tucks on Flat Ball  - 1 x daily - 7 x weekly - 3 sets - 10 reps - 3 sec hold - Seated Shoulder Row with Anchored Resistance  - 3-4 x weekly - 3 sets - 10 reps   ASSESSMENT:  CLINICAL IMPRESSION: Pt demonstrates ongoing limitations with right upper trap pain that appears to be more muscular in origin with negative Spurling's. Upper trap tension especially on right side appears to be more from ongoing overuse of muscle by patient because she relies heavily on UE support when ambulating with 2WW and her left side is much weaker. She did report an improvement in muscular tension on right upper trap as well as decrease in pain to touch. She will continue to benefit from skilled PT to address these aforementioned deficits to be able to carry out UE tasks like self-grooming and home cleaning and cooking tasks without excessive pain and discomfort.  OBJECTIVE IMPAIRMENTS:  decreased ROM, decreased strength, impaired sensation, impaired UE functional use, and pain.   ACTIVITY LIMITATIONS: carrying, lifting, bending, bathing, dressing, reach over head, and  hygiene/grooming  PARTICIPATION LIMITATIONS: meal prep, cleaning, laundry, shopping, and community activity  PERSONAL FACTORS: Age, Education, Sex, Social background, Time since onset of injury/illness/exacerbation, Transportation, and 3+ comorbidities: Cerebral Palsy, HTN, T2DM  are also affecting patient's functional outcome.   REHAB POTENTIAL: Fair chronicity of neck pain and high number of co-morbidities   CLINICAL DECISION MAKING: Stable/uncomplicated  EVALUATION COMPLEXITY: Low   GOALS: Goals reviewed with patient? No  SHORT TERM GOALS: Target date: 08/06/2023  Patient will demonstrate undestanding of home exercise plan by performing exercises correctly with evidence of good carry over with min to no verbal or tactile cues .   Baseline: NT 4/425: Performing exercises independently  Goal status: ACHIEVED       LONG TERM GOALS: Target date: 10/02/2023   Patient will show a statistically significant improvement in her neck function as evidence by >=7.5 pt increase in her neck disability index score to better be able to move neck to improve visual field while driving to avoid accidents. Linder Revere et al, 2009) Baseline: 38/50 (76%) Goal status: ONGOING   2.  Patient will demonstrate improved cervical AROM that are within 10% of normative values for improved cervical mobility and function for improved pain response to activity.  Baseline: Cervical Ext 25, Cervical Rot R/L 20*,  Goal status: ONGOING   3. Patient will demonstrate an improvement in her shoulder periscapular strength as evidenced by an increase >=1/3 grade MMT (4- to 4) for improved cervical spine stability and function to better be able to move neck to increase visual field by turning head to avoid accidents.            Baseline:             MMT Right eval Left eval  Shoulder flexion 4 4  Shoulder extension    Shoulder abduction 4 4  Shoulder adduction    Shoulder extension    Shoulder internal rotation 4 4   Shoulder external rotation 4- 4-  Mid Trap     Rhomboids     Lower Trap     Elbow flexion 4 4  Elbow extension    Wrist flexion 4 4  Wrist extension 4 4  Wrist ulnar deviation 4 4  Wrist radial deviation 4 4  Wrist pronation 4 4  Wrist supination 4 4   (Blank rows = not tested) Grip Strength                                     Fair              Fair            Goal status: ONGOING       PLAN:  PT FREQUENCY: 1-2x/week  PT DURATION: 10 weeks  PLANNED INTERVENTIONS: 97164- PT Re-evaluation, 97110-Therapeutic exercises, 97530- Therapeutic activity, 97112- Neuromuscular re-education, 97535- Self Care, 16109- Manual therapy, [id number]- Gait training, [phone]- Orthotic Fit/training, [id number]- Prosthetic training, [phone]- Aquatic Therapy, [phone]- Electrical stimulation (unattended), [phone]- Electrical stimulation (manual), [id number]- Traction (mechanical), Patient/Family education, Dry Needling, Joint mobilization, Joint manipulation, Spinal manipulation, Spinal mobilization, Cryotherapy, and Moist heat  PLAN FOR NEXT SESSION: Continue with trigger point release and massage of upper trap and introduce levator scap stretch and mid trap stretch. Eye exam using  Snellen chart. Ongoing vestibular function testing: Finger Nose test for nystagmus. Ongoing progression of periscapular strength exercises: D2 Flexion and Extension in supine.   Marge Shed PT, DPT  [facility] [address], [place], [address] Phone: [phone]   Fax:  [phone]

## 2023-08-19 DIAGNOSIS — G801 Spastic diplegic cerebral palsy: Secondary | ICD-10-CM | POA: Diagnosis not present

## 2023-08-19 DIAGNOSIS — Z96653 Presence of artificial knee joint, bilateral: Secondary | ICD-10-CM | POA: Diagnosis not present

## 2023-08-20 ENCOUNTER — Ambulatory Visit: Payer: 59 | Admitting: Physical Therapy

## 2023-08-22 ENCOUNTER — Ambulatory Visit: Payer: 59 | Admitting: Physical Therapy

## 2023-08-23 ENCOUNTER — Telehealth: Admitting: Internal Medicine

## 2023-08-23 ENCOUNTER — Encounter: Payer: Self-pay | Admitting: Internal Medicine

## 2023-08-23 DIAGNOSIS — Z20822 Contact with and (suspected) exposure to covid-19: Secondary | ICD-10-CM | POA: Diagnosis not present

## 2023-08-23 DIAGNOSIS — R0602 Shortness of breath: Secondary | ICD-10-CM

## 2023-08-23 DIAGNOSIS — R6883 Chills (without fever): Secondary | ICD-10-CM | POA: Diagnosis not present

## 2023-08-23 DIAGNOSIS — J3489 Other specified disorders of nose and nasal sinuses: Secondary | ICD-10-CM | POA: Diagnosis not present

## 2023-08-23 DIAGNOSIS — R197 Diarrhea, unspecified: Secondary | ICD-10-CM | POA: Diagnosis not present

## 2023-08-23 DIAGNOSIS — R52 Pain, unspecified: Secondary | ICD-10-CM

## 2023-08-23 NOTE — Progress Notes (Signed)
 Virtual Visit via Video Note  I connected with [name] on 08/23/23 at  4:00 PM EDT by a video enabled telemedicine application and verified that I am speaking with the correct person using two identifiers.  Location: Patient: Home Provider: Office  Person's participating in this video call: Traci Lo, NP-C and Janace Decker   I discussed the limitations of evaluation and management by telemedicine and the availability of in person appointments. The patient expressed understanding and agreed to proceed.  History of Present Illness:  Discussed the use of AI scribe software for clinical note transcription with the patient, who gave verbal consent to proceed.  Traci Davis is a 53 year old female with asthma and diabetes who presents with symptoms suggestive of COVID-19 exposure.  Her boyfriend tested positive for COVID-19 yesterday. She has been experiencing sinus pressure and rhinorrhea for the past few days, initially attributing these symptoms to sinus issues. She also feels clammy and sweaty, similar to previous COVID-19 infections, which she has had four times before.  She denies ear pain or sore throat but has a mild dyspnea, particularly upon waking. She also reports diarrhea for the past three to four days, accompanied by abdominal cramps. No nausea or vomiting.  She experiences fever, chills, and myalgias, waking up several times throughout the day with these symptoms. She describes alternating between cold chills and clamminess. She has not taken any medication for these symptoms yet.  She mentions difficulty ambulating and relies on a walker and wheelchair for mobility. She uses nasal sprays like Nasonex or Flonase regularly and has an albuterol  inhaler for her asthma. She is allergic to prednisone, which she avoids due to its impact on her blood sugar levels.        Past Medical History:  Diagnosis Date   Acute pancreatitis 09/04/2014   Anxiety    Arthritis     Asthma    Cerebral palsy (HCC)    Complication of anesthesia    panic attacks before surgery   COPD (chronic obstructive pulmonary disease) (HCC)    Depression    Diabetes mellitus without complication (HCC)    GERD (gastroesophageal reflux disease)    Hypertension    Noninfectious gastroenteritis and colitis 01/10/2013    Current Outpatient Medications  Medication Sig Dispense Refill   acetaminophen  (TYLENOL ) 325 MG tablet Take 2 tablets (650 mg total) by mouth every 6 (six) hours as needed. 60 tablet 0   albuterol  (PROVENTIL  HFA;VENTOLIN  HFA) 108 (90 BASE) MCG/ACT inhaler Inhale 2 puffs into the lungs 4 (four) times daily as needed. For shortness of breath and/or wheezing     amLODipine  (NORVASC ) 5 MG tablet Take 1 tablet (5 mg total) by mouth daily. 90 tablet 3   atorvastatin  (LIPITOR) 20 MG tablet Take 1 tablet (20 mg total) by mouth daily. 90 tablet 3   azelastine (ASTELIN) 0.1 % nasal spray Place into both nostrils.     Blood Glucose Monitoring Suppl (GLUCOCOM BLOOD GLUCOSE MONITOR) DEVI Test daily before all meals/snacks and once before bedtime.     budesonide-formoterol (SYMBICORT) 80-4.5 MCG/ACT inhaler Inhale 2 puffs up to 4 times daily as needed for symptoms of wheezing or shortness of breath.     busPIRone  (BUSPAR ) 10 MG tablet Take 1 tablet (10 mg total) by mouth 3 (three) times daily. 270 tablet 1   Continuous Glucose Sensor (DEXCOM G7 SENSOR) MISC Apply Dexcom G7 sensor every 10 days. Type 2 Diabetes uncontrolled (E11.65)     CREON  36000-114000  units CPEP capsule Take 3 capsules (108,000 Units total) by mouth 3 (three) times daily with meals. 300 capsule 5   dicyclomine  (BENTYL ) 10 MG capsule Take 2 capsules in morning with meal and 1 capsule in evening with meal. 90 capsule 5   donepezil (ARICEPT) 10 MG tablet Take 10 mg by mouth at bedtime.     estradiol (ESTRACE) 0.1 MG/GM vaginal cream Place 1 Applicatorful vaginally 3 (three) times a week.     FLUoxetine  (PROZAC ) 20 MG  capsule Take 1 capsule (20 mg total) by mouth daily. Take along with 40 mg daily - total of 60 mg daily 90 capsule 1   FLUoxetine  (PROZAC ) 40 MG capsule TAKE 1 CAPSULE BY MOUTH ONCE DAILY . TAKE ALONG WITH 20MG  DAILY AS DIRECTED 90 capsule 1   furosemide  (LASIX ) 40 MG tablet Take 1 tablet (40 mg total) by mouth daily as needed for fluid. 30 tablet 5   gabapentin (NEURONTIN) 300 MG capsule Take 600 mg by mouth 3 (three) times daily.     glucose blood test strip USE TO TEST BLOOD SUGAR TWO TIMES A DAY     GVOKE HYPOPEN  2-PACK 1 MG/0.2ML SOAJ Inject 1 mg into the skin as needed (hypoglycemia). 1 mL 0   incobotulinumtoxinA (XEOMIN) 100 units SOLR injection Inject 100 Units into the muscle every 3 (three) months.      Incontinence Supply Disposable (PREVAIL BLADDER CONTROL PAD) MISC Large incontinence pads to go into underwear     insulin  glargine (LANTUS) 100 UNIT/ML Solostar Pen Inject 34 Units into the skin daily.     Insulin  Pen Needle (GLOBAL EASE INJECT PEN NEEDLES) 31G X 5 MM MISC Use pen needle to inject insulin  daily. 100 each 0   lamoTRIgine  (LAMICTAL ) 25 MG tablet Take 1 tablet (25 mg total) by mouth daily. 90 tablet 1   Lancets (FREESTYLE) lancets Test daily before all meals/snacks     lidocaine  (LIDODERM ) 5 % Place 2 patches onto the skin daily.     linaclotide (LINZESS) 72 MCG capsule Take 72 mcg by mouth daily before breakfast.     lisinopril  (ZESTRIL ) 20 MG tablet Take 1 tablet (20 mg total) by mouth daily. 90 tablet 3   metFORMIN  (GLUCOPHAGE -XR) 500 MG 24 hr tablet Take 2 tablets (1,000 mg total) by mouth 2 (two) times daily. 360 tablet 3   methocarbamol  (ROBAXIN ) 500 MG tablet Take 1 tablet (500 mg total) by mouth 3 (three) times daily as needed for muscle spasms. 90 tablet 5   mirabegron  ER (MYRBETRIQ ) 50 MG TB24 tablet Take 1 tablet (50 mg total) by mouth daily. 30 tablet 3   mirtazapine  (REMERON ) 15 MG tablet Take 1.5 tablets (22.5 mg total) by mouth at bedtime. 135 tablet 1    montelukast  (SINGULAIR ) 10 MG tablet Take 1 tablet (10 mg total) by mouth at bedtime. 90 tablet 3   naloxone (NARCAN) nasal spray 4 mg/0.1 mL      nystatin (MYCOSTATIN/NYSTOP) powder Apply topically.     omeprazole  (PRILOSEC) 40 MG capsule Take 1 capsule (40 mg total) by mouth in the morning and at bedtime. 90 capsule 3   oxyCODONE -acetaminophen  (PERCOCET) 10-325 MG tablet Take 1 tablet by mouth 3 (three) times daily as needed.     SUMAtriptan (IMITREX) 100 MG tablet Take 100 mg by mouth as directed. As needed for migraines     triamcinolone cream (KENALOG) 0.1 % Apply 1 Application topically 3 (three) times daily as needed.     XTAMPZA  ER  13.5 MG C12A Take 1 capsule by mouth 2 (two) times daily.     No current facility-administered medications for this visit.    Allergies  Allergen Reactions   Meloxicam Other (See Comments)    Damage kidney   Morphine And Codeine Other (See Comments)    hallucinations   Vantin [Cefpodoxime] Nausea And Vomiting   Latex Itching   Baclofen Other (See Comments)    "makes cerebral palsy do adverse reaction on me", tightens muscles    Ciprofloxacin  Itching and Nausea And Vomiting   Prednisone Other (See Comments)    Elevated blood glucose   Tape Rash    skin tears.  Paper tape is ok   Tramadol Nausea Only    Family History  Problem Relation Age of Onset   CAD Father    Heart attack Brother    Sexual abuse Paternal Grandfather    Breast cancer Neg Hx     Social History   Socioeconomic History   Marital status: Single    Spouse name: Not on file   Number of children: 1   Years of education: Not on file   Highest education level: Associate degree: occupational, Scientist, product/process development, or vocational program  Occupational History   Not on file  Tobacco Use   Smoking status: Never   Smokeless tobacco: Never  Vaping Use   Vaping status: Never Used  Substance and Sexual Activity   Alcohol use: No   Drug use: No   Sexual activity: Yes    Partners: Male     Birth control/protection: Condom  Other Topics Concern   Not on file  Social History Narrative   Not on file   Social Drivers of Health   Financial Resource Strain: Low Risk  (05/23/2023)   Received from [facility]   Overall Financial Resource Strain (CARDIA)    Difficulty of Paying Living Expenses: Not very hard  Recent Concern: Financial Resource Strain - Medium Risk (05/09/2023)   Overall Financial Resource Strain (CARDIA)    Difficulty of Paying Living Expenses: Somewhat hard  Food Insecurity: No Food Insecurity (05/23/2023)   Received from [facility]   Hunger Vital Sign    Worried About Running Out of Food in the Last Year: Never true    Ran Out of Food in the Last Year: Never true  Recent Concern: Food Insecurity - Food Insecurity Present (05/09/2023)   Hunger Vital Sign    Worried About Running Out of Food in the Last Year: Sometimes true    Ran Out of Food in the Last Year: Sometimes true  Transportation Needs: No Transportation Needs (05/23/2023)   Received from [facility]   PRAPARE - Transportation    Lack of Transportation (Medical): No    Lack of Transportation (Non-Medical): No  Physical Activity: Insufficiently Active (05/09/2023)   Exercise Vital Sign    Days of Exercise per Week: 3 days    Minutes of Exercise per Session: 30 min  Stress: No Stress Concern Present (05/23/2023)   Received from [facility] of Occupational Health - Occupational Stress Questionnaire    Feeling of Stress : Only a little  Social Connections: Socially Integrated (05/23/2023)   Received from [facility]   Social Connection and Isolation Panel [NHANES]    Frequency of Communication with Friends and Family: More than three times a week    Frequency of Social Gatherings with Friends and Family: More than three times a week  Attends Religious Services: More than 4 times per year    Active Member of Clubs or Organizations: Yes    Attends Tax inspector Meetings: More than 4 times per year    Marital Status: Living with partner  Recent Concern: Social Connections - Moderately Isolated (05/09/2023)   Social Connection and Isolation Panel [NHANES]    Frequency of Communication with Friends and Family: Three times a week    Frequency of Social Gatherings with Friends and Family: Twice a week    Attends Religious Services: More than 4 times per year    Active Member of [employer] or Organizations: No    Attends Engineer, structural: Not on file    Marital Status: Never married  Intimate Partner Violence: Not At Risk (05/23/2023)   Received from [facility]   Humiliation, Afraid, Rape, and Kick questionnaire    Fear of Current or Ex-Partner: No    Emotionally Abused: No    Physically Abused: No    Sexually Abused: No     Constitutional: Pt reports chills. Denies fever, headache or abrupt weight changes.  HEENT: Pt reports sinus pressure, runny nose, nasal congestion. Denies eye pain, eye redness, ear pain, ringing in the ears, wax buildup, bloody nose, or sore throat. Respiratory: Pt reports shortness of breath. Denies difficulty breathing, cough or sputum production.   Cardiovascular: Denies chest pain, chest tightness, palpitations or swelling in the hands or feet.  Gastrointestinal: Pt reports diarrhea. Denies abdominal pain, bloating, constipation, or blood in the stool.  Musculoskeletal: Pt reports body aches. Denies joint swelling.  Skin: Denies redness, rashes, lesions or ulcercations.  Neurological: Denies dizziness.  No other specific complaints in a complete review of systems (except as listed in HPI above).  Observations/Objective:  LMP 08/20/2014 Comment: missed cup when trying to obtain urine spec Wt Readings from Last 3 Encounters:  05/31/23 201 lb (91.2 kg)  05/21/23 201 lb (91.2 kg)  05/13/23 198 lb (89.8 kg)    General: Appears her stated age, in NAD. HEENT: Head: normal shape and size, sinus  pressure noted;  Nose: No congestion noted; Throat/Mouth: hoarseness noted Pulmonary/Chest: Normal effort. No respiratory distress.  Neurological: Alert and oriented.   BMET    Component Value Date/Time   NA 139 05/31/2023 1408   NA 134 (L) 04/29/2014 1827   K 3.9 05/31/2023 1408   K 3.8 04/29/2014 1827   CL 98 05/31/2023 1408   CL 103 04/29/2014 1827   CO2 27 05/31/2023 1408   CO2 26 04/29/2014 1827   GLUCOSE 264 (H) 05/31/2023 1408   GLUCOSE 156 (H) 04/29/2014 1827   BUN 12 05/31/2023 1408   BUN 8 04/29/2014 1827   CREATININE 0.88 05/31/2023 1408   CALCIUM  9.1 05/31/2023 1408   CALCIUM  8.5 04/29/2014 1827   GFRNONAA >60 01/17/2023 1457   GFRNONAA >60 04/29/2014 1827   GFRNONAA 58 (L) 01/14/2014 0343   GFRAA >60 10/05/2018 1852   GFRAA >60 04/29/2014 1827   GFRAA >60 01/14/2014 0343    Lipid Panel  No results found for: "CHOL", "TRIG", "HDL", "CHOLHDL", "VLDL", "LDLCALC"  CBC    Component Value Date/Time   WBC 11.0 (H) 05/31/2023 1408   RBC 4.82 05/31/2023 1408   HGB 12.8 05/31/2023 1408   HGB 11.0 (L) 04/29/2014 1827   HCT 39.9 05/31/2023 1408   HCT 35.9 04/29/2014 1827   PLT 336 05/31/2023 1408   PLT 327 04/29/2014 1827   MCV 82.8 05/31/2023 1408  MCV 72 (L) 04/29/2014 1827   MCH 26.6 (L) 05/31/2023 1408   MCHC 32.1 05/31/2023 1408   RDW 14.7 05/31/2023 1408   RDW 21.7 (H) 04/29/2014 1827   LYMPHSABS 2.7 10/05/2018 1852   LYMPHSABS 3.1 04/29/2014 1827   MONOABS 0.4 10/05/2018 1852   MONOABS 0.8 04/29/2014 1827   EOSABS 407 05/31/2023 1408   EOSABS 0.4 04/29/2014 1827   BASOSABS 66 05/31/2023 1408   BASOSABS 0.2 (H) 04/29/2014 1827    Hgb A1C Lab Results  Component Value Date   HGBA1C 8.0 (A) 05/21/2023       Assessment and Plan:  Assessment and Plan    COVID-19 exposure and symptoms Possible COVID-19 infection after exposure to positive case. Symptoms suggest viral infection, possibly COVID-19. Secondary bacterial infection considered if  symptoms persist beyond 7-10 days. Prednisone avoided due to allergy and hyperglycemia risk. - Take COVID-19 test and report results via MyChart. - Alternate acetaminophen  and ibuprofen every eight hours for fever and body aches. - Use Nasonex or Flonase for nasal congestion. - Encourage rest and fluid intake. - Discuss potential Paxlovid use if COVID-19 positive, noting insurance coverage issues.   Asthma Asthma with shortness of breath. No wheezing. Albuterol  inhaler or nebulizer available. Prednisone avoided due to allergy and hyperglycemia risk. - Use albuterol  inhaler or nebulizer as needed for shortness of breath.   Followup with your PCP as previously scheduled  Follow Up Instructions:    I discussed the assessment and treatment plan with the patient. The patient was provided an opportunity to ask questions and all were answered. The patient agreed with the plan and demonstrated an understanding of the instructions.   The patient was advised to call back or seek an in-person evaluation if the symptoms worsen or if the condition fails to improve as anticipated.   Traci Lo, NP

## 2023-08-26 ENCOUNTER — Encounter: Payer: Self-pay | Admitting: Pharmacist

## 2023-08-26 ENCOUNTER — Ambulatory Visit (INDEPENDENT_AMBULATORY_CARE_PROVIDER_SITE_OTHER): Admitting: Family Medicine

## 2023-08-26 ENCOUNTER — Encounter: Payer: Self-pay | Admitting: Family Medicine

## 2023-08-26 ENCOUNTER — Other Ambulatory Visit (INDEPENDENT_AMBULATORY_CARE_PROVIDER_SITE_OTHER): Payer: Self-pay | Admitting: Pharmacist

## 2023-08-26 VITALS — BP 138/82 | HR 93 | Temp 98.9°F | Ht 62.0 in | Wt 207.0 lb

## 2023-08-26 DIAGNOSIS — Z794 Long term (current) use of insulin: Secondary | ICD-10-CM | POA: Diagnosis not present

## 2023-08-26 DIAGNOSIS — E785 Hyperlipidemia, unspecified: Secondary | ICD-10-CM | POA: Diagnosis not present

## 2023-08-26 DIAGNOSIS — Z206 Contact with and (suspected) exposure to human immunodeficiency virus [HIV]: Secondary | ICD-10-CM

## 2023-08-26 DIAGNOSIS — I10 Essential (primary) hypertension: Secondary | ICD-10-CM

## 2023-08-26 DIAGNOSIS — E1169 Type 2 diabetes mellitus with other specified complication: Secondary | ICD-10-CM | POA: Diagnosis not present

## 2023-08-26 DIAGNOSIS — U071 COVID-19: Secondary | ICD-10-CM | POA: Diagnosis not present

## 2023-08-26 DIAGNOSIS — Z113 Encounter for screening for infections with a predominantly sexual mode of transmission: Secondary | ICD-10-CM | POA: Diagnosis not present

## 2023-08-26 MED ORDER — MOLNUPIRAVIR EUA 200MG CAPSULE: 4.0000 | ORAL_CAPSULE | Freq: Two times a day (BID) | ORAL | 0 refills | Status: DC

## 2023-08-26 MED ORDER — MOLNUPIRAVIR EUA 200MG CAPSULE: 4.0000 | ORAL_CAPSULE | Freq: Two times a day (BID) | ORAL | 0 refills | Status: AC

## 2023-08-26 NOTE — Patient Instructions (Addendum)
 Thank you for coming to the office today.  [facility]   Address: [address] [place], [place], [place] [address] Phone: [phone]  Call to re-schedule diabetes education.  DUE for FASTING BLOOD WORK (no food or drink after midnight before the lab appointment, only water or coffee without cream/sugar on the morning of)  Thursday 1:30pm 5/1 Lab + Urine   Please schedule a Follow-up Appointment to: Return if symptoms worsen or fail to improve.  If you have any other questions or concerns, please feel free to call the office or send a message through MyChart. You may also schedule an earlier appointment if necessary.  Additionally, you may be receiving a survey about your experience at our office within a few days to 1 week by e-mail or mail. We value your feedback.  Domingo Friend, DO [facility], [place]

## 2023-08-26 NOTE — Patient Instructions (Signed)
 While calibration is not required with the Dexcom G7 Continuous Glucose monitor, you can use a fingerstick blood glucose reading to help calibrated your device.    Please copy and paste the following web address into your internet browser to review the following article from Dex[employer] on how to Calibrate Dexcom G7:   [email or web address]     Thank you!   Arthur Lash, PharmD, Becky Bowels, CPP Clinical Pharmacist [facility] [phone]

## 2023-08-26 NOTE — Progress Notes (Signed)
 Subjective:    Patient ID: Traci Davis, female    DOB: [birth year], 53 y.o.   MRN: [id number]  Traci Davis is a 53 y.o. female presenting on 08/26/2023 for Covid Positive   HPI  Discussed the use of AI scribe software for clinical note transcription with the patient, who gave verbal consent to proceed.  History of Present Illness   Traci Davis is a 53 year old female with diabetes who presents with COVID-19 symptoms and elevated blood sugar levels.  She tested positive for COVID-19 yesterday after experiencing symptoms since Saturday. Initially, she had a low-grade fever managed with Tylenol  and ibuprofen, which returned on Sunday, reaching 100.5F, and was 99.2F this morning. Her boyfriend tested positive for COVID-19 on Friday, and she initially tested negative on Friday before testing positive on Sunday. She is experiencing a sore throat and difficulty breathing due to the soreness, which worsened this morning. She has not taken any specific medications for respiratory symptoms but uses albuterol and Symbicort inhalers. She has not taken any cough suppressants or antiviral medications for this episode yet.  Her blood sugar levels have been fluctuating between 225 and 350 mg/dL, and she is unable to lower them. She is using a Dexcom device for monitoring but is unsure how to calibrate it, noting a discrepancy of about 20 points compared to her finger stick readings. She has not taken her pain medications today, only her blood pressure medication.  She has a history of multiple COVID-19 infections, having contracted it four times since 2019, despite being vaccinated.   She also mentioned a concern about potential HIV exposure from a previous partner and plans to get tested. She was in a long-term relationship for fifteen years with a partner who has HIV, raising concerns about her own HIV status.     Meds: Metformin XR 500mg x 2 = 1000mg TWICE A DAY, Lantus 30 units daily        10 /30/2024    4:52 PM 12/06/2022    3:34 PM 04/05/2022    4:17 PM  Depression screen PHQ 2/9  Decreased Interest 0    Down, Depressed, Hopeless 0    PHQ - 2 Score 0    Altered sleeping     Tired, decreased energy     Change in appetite     Feeling bad or failure about yourself      Trouble concentrating     Moving slowly or fidgety/restless     Suicidal thoughts     PHQ-9 Score        Information is confidential and restricted. Go to Review Flowsheets to unlock data.       12/06/2022    3:34 PM 04/05/2022    4:18 PM 06/06/2021    4:51 PM  GAD 7 : Generalized Anxiety Score  Nervous, Anxious, on Edge     Control/stop worrying     Worry too much - different things     Trouble relaxing     Restless     Easily annoyed or irritable     Afraid - awful might happen     Total GAD 7 Score     Anxiety Difficulty        Information is confidential and restricted. Go to Review Flowsheets to unlock data.    Social History   Tobacco Use   Smoking status: Never   Smokeless tobacco: Never  Vaping Use   Vaping status: Never Used  Substance Use Topics  Alcohol use: No   Drug use: No    Review of Systems Per HPI unless specifically indicated above     Objective:    BP 138/82   Pulse 93   Temp 98.9 F (37.2 C)   Ht 5\' 2"  (1.575 m)   Wt 207 lb (93.9 kg)   LMP 08/20/2014 Comment: missed cup when trying to obtain urine spec  SpO2 98%   BMI 37.86 kg/m   Wt Readings from Last 3 Encounters:  08/26/23 207 lb (93.9 kg)  05/31/23 201 lb (91.2 kg)  05/21/23 201 lb (91.2 kg)    Physical Exam Vitals and nursing note reviewed.  Constitutional:      General: She is not in acute distress.    Appearance: She is well-developed. She is obese. She is not diaphoretic.     Comments: Well-appearing, comfortable, cooperative  HENT:     Head: Normocephalic and atraumatic.  Eyes:     General:        Right eye: No discharge.        Left eye: No discharge.     Conjunctiva/sclera:  Conjunctivae normal.  Neck:     Thyroid : No thyromegaly.  Cardiovascular:     Rate and Rhythm: Normal rate and regular rhythm.     Heart sounds: Normal heart sounds. No murmur heard. Pulmonary:     Effort: Pulmonary effort is normal. No respiratory distress.     Breath sounds: Normal breath sounds. No wheezing or rales.  Musculoskeletal:        General: Normal range of motion.     Cervical back: Normal range of motion and neck supple.  Lymphadenopathy:     Cervical: No cervical adenopathy.  Skin:    General: Skin is warm and dry.     Findings: No erythema or rash.  Neurological:     Mental Status: She is alert and oriented to person, place, and time.  Psychiatric:        Behavior: Behavior normal.     Comments: Well groomed, good eye contact, normal speech and thoughts     Results for orders placed or performed in visit on 06/17/23  Lipase   Collection Time: 08/01/23  3:19 PM  Result Value Ref Range   Lipase 12 (L) 14 - 72 U/L      Assessment & Plan:   Problem List Items Addressed This Visit     Essential hypertension   Relevant Orders   CBC with Differential/Platelet   Comprehensive metabolic panel with GFR   Type 2 diabetes mellitus with other specified complication (HCC)   Relevant Orders   Lipid panel   Hemoglobin A1c   Microalbumin / creatinine urine ratio   TSH   Comprehensive metabolic panel with GFR   Other Visit Diagnoses       COVID-19    -  Primary   Relevant Medications   molnupiravir EUA (LAGEVRIO) 200 mg CAPS capsule     Exposure to HIV       Relevant Orders   HIV Antibody (routine testing w rflx)   RPR     Screen for STD (sexually transmitted disease)       Relevant Orders   HIV Antibody (routine testing w rflx)   RPR   GC/Chlamydia probe amp ([facility])not at [facility]     Hyperlipidemia associated with type 2 diabetes mellitus (HCC)       Relevant Orders   Lipid panel   TSH   Comprehensive metabolic panel  with GFR        COVID-19  infection Tested positive for COVID-19 with symptoms of low-grade fever, sore throat, and difficulty breathing. Paxlovid contraindicated due to interaction with pain medications. Molnupiravir considered as alternative. - Prescribe molnupiravir. - Consider non-codeine cough syrup if insurance covers. However not covered, so defer rx today - Advise use of albuterol  and Symbicort inhalers. - Advise against steroids due to hyperglycemic effect. - Continue OTC remedy  Type 2 diabetes mellitus with hyperglycemia Blood glucose levels elevated, ranging from 225 to 350 mg/dL. Difficulty with Dexcom calibration noted. Stress and illness may contribute to hyperglycemia. - Schedule appointment with clinical pharmacist for Dexcom assistance in 1-2 months when available for in person visit - Order blood work for A1c, kidney function, and other tests on Thursday at 1:30 PM. - Advise monitoring blood glucose levels and maintaining current diabetes management. Continue Metformin  + Lantus, we can discuss alternative options upcoming  Hypertension Hypertension managed with medication. Blood pressure elevated upon clinic entry, no acute issues. - Continue current blood pressure medication.     STD Exposure Recently found out prior partner of many years in past was diagnosed with HIV. She says he was unfaithful and has had other partners. She needs HIV and all STD testing at this time Labs 08/29/23 upcoming    Orders Placed This Encounter  Procedures   Lipid panel    Standing Status:   Future    Expected Date:   08/29/2023    Expiration Date:   08/25/2024    Has the patient fasted?:   Yes   Hemoglobin A1c    Standing Status:   Future    Expected Date:   08/29/2023    Expiration Date:   08/25/2024   CBC with Differential/Platelet    Standing Status:   Future    Expected Date:   08/29/2023    Expiration Date:   08/25/2024   Microalbumin / creatinine urine ratio    Standing Status:   Future    Expected Date:    08/29/2023    Expiration Date:   08/25/2024   TSH    Standing Status:   Future    Expected Date:   08/29/2023    Expiration Date:   08/25/2024   Comprehensive metabolic panel with GFR    Standing Status:   Future    Expected Date:   08/29/2023    Expiration Date:   08/25/2024    Has the patient fasted?:   Yes   HIV Antibody (routine testing w rflx)    Standing Status:   Future    Expected Date:   08/29/2023    Expiration Date:   08/25/2024   RPR    Standing Status:   Future    Expected Date:   08/29/2023    Expiration Date:   08/25/2024    Meds ordered this encounter  Medications   DISCONTD: molnupiravir EUA (LAGEVRIO) 200 mg CAPS capsule    Sig: Take 4 capsules (800 mg total) by mouth 2 (two) times daily for 5 days.    Dispense:  40 capsule    Refill:  0   molnupiravir EUA (LAGEVRIO) 200 mg CAPS capsule    Sig: Take 4 capsules (800 mg total) by mouth 2 (two) times daily for 5 days.    Dispense:  40 capsule    Refill:  0    Follow up plan: Return if symptoms worsen or fail to improve.  Labs 08/29/23 STD Panel + Routine  labs.  Domingo Friend, DO [facility] 08/26/2023, 1:59 PM

## 2023-08-26 NOTE — Progress Notes (Signed)
   08/26/2023  Patient ID: Traci Davis, female   DOB: [birth year], 53 y.o.   MRN: [id number]   Receive a message from PCP requesting outreach to patient to address questions regarding her Dexcom G7 continuous glucose monitor.  Patient asks about why she is often noticing a difference of up to 20 points sometimes between her CGM and fingerstick checks and wonders if there is anyway to calibrate her Dexcom G7 CGM  Explain that one reason for the difference in these numbers is that the glucometer measures a sample from the blood, while Dexcom G7 measures interstitial fluid, so there can be a lag between the change in blood sugar level in the blood to change in the interstitial fluid. Some other factors that influence CGM accuracy include time from insertion of sensor (CGM numbers may be less accurate in the first 24 hours after insertion) or if there is pressure against the sensor. Factors impacting test strip accuracy include hand cleanliness and storage of test strips  Provide patient with link via MyChart for instructions on how to calibrate the Dexcom G7, if she would like to to try to make these readings closer to her glucometer values.  Arthur Lash, PharmD, Becky Bowels, CPP Clinical Pharmacist [facility] [phone]

## 2023-08-27 ENCOUNTER — Ambulatory Visit: Payer: 59 | Admitting: Physical Therapy

## 2023-08-29 ENCOUNTER — Other Ambulatory Visit

## 2023-08-29 ENCOUNTER — Telehealth: Admitting: Physician Assistant

## 2023-08-29 ENCOUNTER — Ambulatory Visit: Payer: Self-pay

## 2023-08-29 ENCOUNTER — Ambulatory Visit

## 2023-08-29 DIAGNOSIS — Z7902 Long term (current) use of antithrombotics/antiplatelets: Secondary | ICD-10-CM | POA: Diagnosis not present

## 2023-08-29 DIAGNOSIS — Z885 Allergy status to narcotic agent status: Secondary | ICD-10-CM | POA: Diagnosis not present

## 2023-08-29 DIAGNOSIS — E860 Dehydration: Secondary | ICD-10-CM | POA: Diagnosis not present

## 2023-08-29 DIAGNOSIS — I10 Essential (primary) hypertension: Secondary | ICD-10-CM

## 2023-08-29 DIAGNOSIS — Z794 Long term (current) use of insulin: Secondary | ICD-10-CM | POA: Diagnosis not present

## 2023-08-29 DIAGNOSIS — E785 Hyperlipidemia, unspecified: Secondary | ICD-10-CM | POA: Diagnosis not present

## 2023-08-29 DIAGNOSIS — R197 Diarrhea, unspecified: Secondary | ICD-10-CM | POA: Diagnosis not present

## 2023-08-29 DIAGNOSIS — R059 Cough, unspecified: Secondary | ICD-10-CM | POA: Diagnosis not present

## 2023-08-29 DIAGNOSIS — Z881 Allergy status to other antibiotic agents status: Secondary | ICD-10-CM | POA: Diagnosis not present

## 2023-08-29 DIAGNOSIS — R509 Fever, unspecified: Secondary | ICD-10-CM | POA: Diagnosis not present

## 2023-08-29 DIAGNOSIS — U071 COVID-19: Secondary | ICD-10-CM | POA: Diagnosis not present

## 2023-08-29 DIAGNOSIS — Z79899 Other long term (current) drug therapy: Secondary | ICD-10-CM | POA: Diagnosis not present

## 2023-08-29 DIAGNOSIS — Z206 Contact with and (suspected) exposure to human immunodeficiency virus [HIV]: Secondary | ICD-10-CM

## 2023-08-29 DIAGNOSIS — Z113 Encounter for screening for infections with a predominantly sexual mode of transmission: Secondary | ICD-10-CM

## 2023-08-29 DIAGNOSIS — E119 Type 2 diabetes mellitus without complications: Secondary | ICD-10-CM | POA: Diagnosis not present

## 2023-08-29 DIAGNOSIS — E1169 Type 2 diabetes mellitus with other specified complication: Secondary | ICD-10-CM

## 2023-08-29 NOTE — Patient Instructions (Signed)
 Traci Davis, thank you for joining Wanita Gutta, PA-C for today's virtual visit.  While this provider is not your primary care provider (PCP), if your PCP is located in our provider database this encounter information will be shared with them immediately following your visit.   A [facility] MyChart account gives you access to today's visit and all your visits, tests, and labs performed at [facility] " click here if you don't have a [facility] MyChart account or go to [email or web address]  Consent: (Patient) Traci Davis provided verbal consent for this virtual visit at the beginning of the encounter.  Current Medications:  Current Outpatient Medications:    acetaminophen  (TYLENOL ) 325 MG tablet, Take 2 tablets (650 mg total) by mouth every 6 (six) hours as needed., Disp: 60 tablet, Rfl: 0   albuterol  (PROVENTIL  HFA;VENTOLIN  HFA) 108 (90 BASE) MCG/ACT inhaler, Inhale 2 puffs into the lungs 4 (four) times daily as needed. For shortness of breath and/or wheezing, Disp: , Rfl:    amLODipine  (NORVASC ) 5 MG tablet, Take 1 tablet (5 mg total) by mouth daily., Disp: 90 tablet, Rfl: 3   atorvastatin  (LIPITOR) 20 MG tablet, Take 1 tablet (20 mg total) by mouth daily., Disp: 90 tablet, Rfl: 3   azelastine (ASTELIN) 0.1 % nasal spray, Place into both nostrils., Disp: , Rfl:    Blood Glucose Monitoring Suppl (GLUCOCOM BLOOD GLUCOSE MONITOR) DEVI, Test daily before all meals/snacks and once before bedtime., Disp: , Rfl:    budesonide-formoterol (SYMBICORT) 80-4.5 MCG/ACT inhaler, Inhale 2 puffs up to 4 times daily as needed for symptoms of wheezing or shortness of breath., Disp: , Rfl:    busPIRone  (BUSPAR ) 10 MG tablet, Take 1 tablet (10 mg total) by mouth 3 (three) times daily., Disp: 270 tablet, Rfl: 1   Continuous Glucose Sensor (DEXCOM G7 SENSOR) MISC, Apply Dexcom G7 sensor every 10 days. Type 2 Diabetes uncontrolled (E11.65), Disp: , Rfl:    CREON  36000-114000 units CPEP  capsule, Take 3 capsules (108,000 Units total) by mouth 3 (three) times daily with meals., Disp: 300 capsule, Rfl: 5   dicyclomine  (BENTYL ) 10 MG capsule, Take 2 capsules in morning with meal and 1 capsule in evening with meal., Disp: 90 capsule, Rfl: 5   donepezil (ARICEPT) 10 MG tablet, Take 10 mg by mouth at bedtime., Disp: , Rfl:    estradiol (ESTRACE) 0.1 MG/GM vaginal cream, Place 1 Applicatorful vaginally 3 (three) times a week., Disp: , Rfl:    FLUoxetine  (PROZAC ) 20 MG capsule, Take 1 capsule (20 mg total) by mouth daily. Take along with 40 mg daily - total of 60 mg daily, Disp: 90 capsule, Rfl: 1   FLUoxetine  (PROZAC ) 40 MG capsule, TAKE 1 CAPSULE BY MOUTH ONCE DAILY . TAKE ALONG WITH 20MG  DAILY AS DIRECTED, Disp: 90 capsule, Rfl: 1   furosemide  (LASIX ) 40 MG tablet, Take 1 tablet (40 mg total) by mouth daily as needed for fluid., Disp: 30 tablet, Rfl: 5   gabapentin (NEURONTIN) 300 MG capsule, Take 600 mg by mouth 3 (three) times daily., Disp: , Rfl:    glucose blood test strip, USE TO TEST BLOOD SUGAR TWO TIMES A DAY, Disp: , Rfl:    GVOKE HYPOPEN  2-PACK 1 MG/0.2ML SOAJ, Inject 1 mg into the skin as needed (hypoglycemia)., Disp: 1 mL, Rfl: 0   incobotulinumtoxinA (XEOMIN) 100 units SOLR injection, Inject 100 Units into the muscle every 3 (three) months. , Disp: , Rfl:    Incontinence Supply Disposable (PREVAIL BLADDER  CONTROL PAD) MISC, Large incontinence pads to go into underwear, Disp: , Rfl:    insulin  glargine (LANTUS) 100 UNIT/ML Solostar Pen, Inject 34 Units into the skin daily., Disp: , Rfl:    Insulin  Pen Needle (GLOBAL EASE INJECT PEN NEEDLES) 31G X 5 MM MISC, Use pen needle to inject insulin  daily., Disp: 100 each, Rfl: 0   lamoTRIgine  (LAMICTAL ) 25 MG tablet, Take 1 tablet (25 mg total) by mouth daily., Disp: 90 tablet, Rfl: 1   Lancets (FREESTYLE) lancets, Test daily before all meals/snacks, Disp: , Rfl:    lidocaine  (LIDODERM ) 5 %, Place 2 patches onto the skin daily., Disp:  , Rfl:    linaclotide (LINZESS) 72 MCG capsule, Take 72 mcg by mouth daily before breakfast., Disp: , Rfl:    lisinopril  (ZESTRIL ) 20 MG tablet, Take 1 tablet (20 mg total) by mouth daily., Disp: 90 tablet, Rfl: 3   metFORMIN  (GLUCOPHAGE -XR) 500 MG 24 hr tablet, Take 2 tablets (1,000 mg total) by mouth 2 (two) times daily., Disp: 360 tablet, Rfl: 3   methocarbamol  (ROBAXIN ) 500 MG tablet, Take 1 tablet (500 mg total) by mouth 3 (three) times daily as needed for muscle spasms., Disp: 90 tablet, Rfl: 5   mirabegron  ER (MYRBETRIQ ) 50 MG TB24 tablet, Take 1 tablet (50 mg total) by mouth daily., Disp: 30 tablet, Rfl: 3   mirtazapine  (REMERON ) 15 MG tablet, Take 1.5 tablets (22.5 mg total) by mouth at bedtime., Disp: 135 tablet, Rfl: 1   molnupiravir  EUA (LAGEVRIO ) 200 mg CAPS capsule, Take 4 capsules (800 mg total) by mouth 2 (two) times daily for 5 days., Disp: 40 capsule, Rfl: 0   montelukast  (SINGULAIR ) 10 MG tablet, Take 1 tablet (10 mg total) by mouth at bedtime., Disp: 90 tablet, Rfl: 3   naloxone (NARCAN) nasal spray 4 mg/0.1 mL, , Disp: , Rfl:    nystatin (MYCOSTATIN/NYSTOP) powder, Apply topically., Disp: , Rfl:    omeprazole  (PRILOSEC) 40 MG capsule, Take 1 capsule (40 mg total) by mouth in the morning and at bedtime., Disp: 90 capsule, Rfl: 3   oxyCODONE -acetaminophen  (PERCOCET) 10-325 MG tablet, Take 1 tablet by mouth 3 (three) times daily as needed., Disp: , Rfl:    SUMAtriptan (IMITREX) 100 MG tablet, Take 100 mg by mouth as directed. As needed for migraines, Disp: , Rfl:    triamcinolone cream (KENALOG) 0.1 %, Apply 1 Application topically 3 (three) times daily as needed., Disp: , Rfl:    XTAMPZA  ER 13.5 MG C12A, Take 1 capsule by mouth 2 (two) times daily., Disp: , Rfl:    Medications ordered in this encounter:  No orders of the defined types were placed in this encounter.    *If you need refills on other medications prior to your next appointment, please contact your  pharmacy*  Follow-Up: Call back or seek an in-person evaluation if the symptoms worsen or if the condition fails to improve as anticipated.  [facility] [phone]  Other Instructions I'm concerned that she maybe dehydrated since unable to keel liquids in her body for the past 10 days.  Stay hydrated with clear liquids Consider BRAT diet until symptoms improve. Recommended to seek ER evaluation for further testing for ongoing diarrhea and for dehydration management with IV fluids if appropriated.   If you have been instructed to have an in-person evaluation today at a local Urgent Care facility, please use the link below. It will take you to a list of all of our available [facility], including address, phone number and hours of operation. Please do not delay care.  [facility]  If you or a family member do not have a primary care provider, use the link below to schedule a visit and establish care. When you choose a [facility] primary care physician or advanced practice provider, you gain a long-term partner in health. Find a Primary Care Provider  Learn more about [employer]'s in-office and virtual care options: [facility] - Get Care Now

## 2023-08-29 NOTE — Progress Notes (Signed)
 Virtual Visit Consent   SHIANA GROSSE, you are scheduled for a virtual visit with a [facility] provider today. Just as with appointments in the office, your consent must be obtained to participate. Your consent will be active for this visit and any virtual visit you may have with one of our providers in the next 365 days. If you have a MyChart account, a copy of this consent can be sent to you electronically.  As this is a virtual visit, video technology does not allow for your provider to perform a traditional examination. This may limit your provider's ability to fully assess your condition. If your provider identifies any concerns that need to be evaluated in person or the need to arrange testing (such as labs, EKG, etc.), we will make arrangements to do so. Although advances in technology are sophisticated, we cannot ensure that it will always work on either your end or our end. If the connection with a video visit is poor, the visit may have to be switched to a telephone visit. With either a video or telephone visit, we are not always able to ensure that we have a secure connection.  By engaging in this virtual visit, you consent to the provision of healthcare and authorize for your insurance to be billed (if applicable) for the services provided during this visit. Depending on your insurance coverage, you may receive a charge related to this service.  I need to obtain your verbal consent now. Are you willing to proceed with your visit today? JORENE LAHRMAN has provided verbal consent on 08/29/2023 for a virtual visit (video or telephone). Wanita Gutta, PA-C  Date: 08/29/2023 6:46 PM   Virtual Visit via Video Note   I, Wanita Gutta, connected with  KERIANNA LESHNER  ([id number], Nov 15, 1970) on 08/29/23 at  5:00 PM EDT by a video-enabled telemedicine application and verified that I am speaking with the correct person using two identifiers.  Location: Patient: Virtual Visit Location Patient:  Home Provider: Virtual Visit Location Provider: Home Office   I discussed the limitations of evaluation and management by telemedicine and the availability of in person appointments. The patient expressed understanding and agreed to proceed.    History of Present Illness: NITIKA WANNINGER is a 53 y.o. who identifies as a female who was assigned female at birth, and is being seen today for diarrhea & dehydration.Aaron Aas  HPI: 53 y/o F with h/o Cerebral Palsy presents via video telehealth visit for c/o watery diarrhea x 10 days and not improving. Diagnosed with Covid and currently on medicine. She is also taking otc Imodium for diarrhea without much relief. She denies recent international travel. No changes in medicines or otc vitamins/supplements. No changes in diet recently. Describes it as abdominal cramp like pain. No urinary symptoms.  Diarrhea     Problems:  Patient Active Problem List   Diagnosis Date Noted   Urge urinary incontinence 04/02/2023   Incontinence of feces 04/02/2023   Vaginal atrophy 04/02/2023   Asthma, persistent controlled 02/06/2023   Continuous leakage of urine 01/04/2023   De Quervain's tenosynovitis, right 10/08/2022   Hot flashes due to menopause 07/17/2022   Convulsions (HCC) 07/09/2022   Obesity due to excess calories 09/22/2020   Insomnia due to medical condition 05/11/2020   Hamstring tendonitis 01/29/2020   MDD (major depressive disorder), recurrent, in full remission (HCC) 08/12/2019   Seizure-like activity (HCC) 07/08/2019   Mild episode of recurrent major depressive disorder (HCC) 12/29/2018   Insomnia due  to mental condition 12/29/2018   Disorder of vein 03/04/2018   Lumbar sprain 03/04/2018   Muscle weakness 03/04/2018   Neck sprain 03/04/2018   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   Postmenopausal bleeding 02/18/2018   Impingement syndrome of shoulder region 02/04/2018   Chest pain with moderate risk for cardiac etiology 05/19/2017   GAD (generalized  anxiety disorder) 04/15/2017   Chronic daily headache 04/15/2017   Panic attack 04/15/2017   Nocturnal enuresis 04/15/2017   Mild cognitive impairment 04/15/2017   Loss of memory 01/14/2017   Pain medication agreement signed 01/08/2017   Headache disorder 12/04/2016   Chronic, continuous use of opioids 07/01/2015   Vertigo 07/01/2015   Chronic midline low back pain without sciatica 03/21/2015   Type 2 diabetes mellitus with other specified complication (HCC) 09/05/2014   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   Hip pain, chronic 04/28/2013   Essential hypertension 01/10/2013   Irritable colon 01/10/2013   Encounter for long-term (current) use of other medications 12/04/2012   Chronic GERD 08/12/2012   Diarrhea 08/12/2012   Primary localized osteoarthrosis, lower leg 05/07/2012   Difficulty walking 02/06/2012    Allergies:  Allergies  Allergen Reactions   Meloxicam Other (See Comments)    Damage kidney   Morphine And Codeine Other (See Comments)    hallucinations   Vantin [Cefpodoxime] Nausea And Vomiting   Latex Itching   Baclofen Other (See Comments)    "makes cerebral palsy do adverse reaction on me", tightens muscles    Ciprofloxacin  Itching and Nausea And Vomiting   Prednisone Other (See Comments)    Elevated blood glucose   Tape Rash    skin tears.  Paper tape is ok   Tramadol Nausea Only   Medications:  Current Outpatient Medications:    acetaminophen  (TYLENOL ) 325 MG tablet, Take 2 tablets (650 mg total) by mouth every 6 (six) hours as needed., Disp: 60 tablet, Rfl: 0   albuterol  (PROVENTIL  HFA;VENTOLIN  HFA) 108 (90 BASE) MCG/ACT inhaler, Inhale 2 puffs into the lungs 4 (four) times daily as needed. For shortness of breath and/or wheezing, Disp: , Rfl:    amLODipine  (NORVASC ) 5 MG tablet, Take 1 tablet (5 mg total) by mouth daily., Disp: 90 tablet, Rfl: 3   atorvastatin  (LIPITOR) 20 MG tablet, Take 1 tablet (20 mg total) by mouth daily., Disp: 90 tablet,  Rfl: 3   azelastine (ASTELIN) 0.1 % nasal spray, Place into both nostrils., Disp: , Rfl:    Blood Glucose Monitoring Suppl (GLUCOCOM BLOOD GLUCOSE MONITOR) DEVI, Test daily before all meals/snacks and once before bedtime., Disp: , Rfl:    budesonide-formoterol (SYMBICORT) 80-4.5 MCG/ACT inhaler, Inhale 2 puffs up to 4 times daily as needed for symptoms of wheezing or shortness of breath., Disp: , Rfl:    busPIRone  (BUSPAR ) 10 MG tablet, Take 1 tablet (10 mg total) by mouth 3 (three) times daily., Disp: 270 tablet, Rfl: 1   Continuous Glucose Sensor (DEXCOM G7 SENSOR) MISC, Apply Dexcom G7 sensor every 10 days. Type 2 Diabetes uncontrolled (E11.65), Disp: , Rfl:    CREON  36000-114000 units CPEP capsule, Take 3 capsules (108,000 Units total) by mouth 3 (three) times daily with meals., Disp: 300 capsule, Rfl: 5   dicyclomine  (BENTYL ) 10 MG capsule, Take 2 capsules in morning with meal and 1 capsule in evening with meal., Disp: 90 capsule, Rfl: 5   donepezil (ARICEPT) 10 MG tablet, Take 10 mg by mouth at bedtime., Disp: , Rfl:    estradiol (ESTRACE) 0.1 MG/GM  vaginal cream, Place 1 Applicatorful vaginally 3 (three) times a week., Disp: , Rfl:    FLUoxetine  (PROZAC ) 20 MG capsule, Take 1 capsule (20 mg total) by mouth daily. Take along with 40 mg daily - total of 60 mg daily, Disp: 90 capsule, Rfl: 1   FLUoxetine  (PROZAC ) 40 MG capsule, TAKE 1 CAPSULE BY MOUTH ONCE DAILY . TAKE ALONG WITH 20MG  DAILY AS DIRECTED, Disp: 90 capsule, Rfl: 1   furosemide  (LASIX ) 40 MG tablet, Take 1 tablet (40 mg total) by mouth daily as needed for fluid., Disp: 30 tablet, Rfl: 5   gabapentin (NEURONTIN) 300 MG capsule, Take 600 mg by mouth 3 (three) times daily., Disp: , Rfl:    glucose blood test strip, USE TO TEST BLOOD SUGAR TWO TIMES A DAY, Disp: , Rfl:    GVOKE HYPOPEN  2-PACK 1 MG/0.2ML SOAJ, Inject 1 mg into the skin as needed (hypoglycemia)., Disp: 1 mL, Rfl: 0   incobotulinumtoxinA (XEOMIN) 100 units SOLR injection,  Inject 100 Units into the muscle every 3 (three) months. , Disp: , Rfl:    Incontinence Supply Disposable (PREVAIL BLADDER CONTROL PAD) MISC, Large incontinence pads to go into underwear, Disp: , Rfl:    insulin  glargine (LANTUS) 100 UNIT/ML Solostar Pen, Inject 34 Units into the skin daily., Disp: , Rfl:    Insulin  Pen Needle (GLOBAL EASE INJECT PEN NEEDLES) 31G X 5 MM MISC, Use pen needle to inject insulin  daily., Disp: 100 each, Rfl: 0   lamoTRIgine  (LAMICTAL ) 25 MG tablet, Take 1 tablet (25 mg total) by mouth daily., Disp: 90 tablet, Rfl: 1   Lancets (FREESTYLE) lancets, Test daily before all meals/snacks, Disp: , Rfl:    lidocaine  (LIDODERM ) 5 %, Place 2 patches onto the skin daily., Disp: , Rfl:    linaclotide (LINZESS) 72 MCG capsule, Take 72 mcg by mouth daily before breakfast., Disp: , Rfl:    lisinopril  (ZESTRIL ) 20 MG tablet, Take 1 tablet (20 mg total) by mouth daily., Disp: 90 tablet, Rfl: 3   metFORMIN  (GLUCOPHAGE -XR) 500 MG 24 hr tablet, Take 2 tablets (1,000 mg total) by mouth 2 (two) times daily., Disp: 360 tablet, Rfl: 3   methocarbamol  (ROBAXIN ) 500 MG tablet, Take 1 tablet (500 mg total) by mouth 3 (three) times daily as needed for muscle spasms., Disp: 90 tablet, Rfl: 5   mirabegron  ER (MYRBETRIQ ) 50 MG TB24 tablet, Take 1 tablet (50 mg total) by mouth daily., Disp: 30 tablet, Rfl: 3   mirtazapine  (REMERON ) 15 MG tablet, Take 1.5 tablets (22.5 mg total) by mouth at bedtime., Disp: 135 tablet, Rfl: 1   molnupiravir  EUA (LAGEVRIO ) 200 mg CAPS capsule, Take 4 capsules (800 mg total) by mouth 2 (two) times daily for 5 days., Disp: 40 capsule, Rfl: 0   montelukast  (SINGULAIR ) 10 MG tablet, Take 1 tablet (10 mg total) by mouth at bedtime., Disp: 90 tablet, Rfl: 3   naloxone (NARCAN) nasal spray 4 mg/0.1 mL, , Disp: , Rfl:    nystatin (MYCOSTATIN/NYSTOP) powder, Apply topically., Disp: , Rfl:    omeprazole  (PRILOSEC) 40 MG capsule, Take 1 capsule (40 mg total) by mouth in the morning  and at bedtime., Disp: 90 capsule, Rfl: 3   oxyCODONE -acetaminophen  (PERCOCET) 10-325 MG tablet, Take 1 tablet by mouth 3 (three) times daily as needed., Disp: , Rfl:    SUMAtriptan (IMITREX) 100 MG tablet, Take 100 mg by mouth as directed. As needed for migraines, Disp: , Rfl:    triamcinolone cream (KENALOG) 0.1 %, Apply  1 Application topically 3 (three) times daily as needed., Disp: , Rfl:    XTAMPZA  ER 13.5 MG C12A, Take 1 capsule by mouth 2 (two) times daily., Disp: , Rfl:   Observations/Objective: Patient is well-developed, well-nourished in no acute distress.  Resting comfortably  at home.  Head is normocephalic, atraumatic.  No labored breathing.  Speech is clear and coherent with logical content.  Patient is alert and oriented at baseline.    Assessment and Plan: 1. Diarrhea, unspecified type (Primary) Not improved with Imodium.  2. Dehydration I'm concerned that she maybe dehydrated since unable to keel liquids in her body for the past 10 days.  Stay hydrated with clear liquids Consider BRAT diet until symptoms improve. Recommended to seek ER evaluation for further testing for ongoing diarrhea and for dehydration management with IV fluids if appropriated. She verbalized understanding and in agreement.  Follow Up Instructions: I discussed the assessment and treatment plan with the patient. The patient was provided an opportunity to ask questions and all were answered. The patient agreed with the plan and demonstrated an understanding of the instructions.  A copy of instructions were sent to the patient via MyChart unless otherwise noted below.   Patient has requested to receive PHI (AVS, Work Notes, etc) pertaining to this video visit through e-mail as they are currently without active MyChart. They have voiced understand that email is not considered secure and their health information could be viewed by someone other than the patient.   The patient was advised to call back or  seek an in-person evaluation if the symptoms worsen or if the condition fails to improve as anticipated.    Armida Vickroy, PA-C

## 2023-08-29 NOTE — Telephone Encounter (Signed)
 Copied from CRM [id number]. Topic: Clinical - Red Word Triage >> Aug 29, 2023  4:07 PM [facility] [place] wrote: Red Word that prompted transfer to Nurse Triage: Has Covid since Sunday, has had diarrhea since last week Monday, sore throat, cough  Chief Complaint: Diarrhea Symptoms: COVID + symptoms- congestion, runny nose, sore throat, weakness Frequency: 1-2 weeks Pertinent Negatives: Patient denies relief Disposition: [] ED /[x] Urgent Care (no appt availability in office) / [x] Appointment(In office/virtual)/ []  [facility]/ [] Home Care/ [] Refused Recommended Disposition /[] [facility]/ []  Follow-up with PCP Additional Notes: Patient called in to report diarrhea that has been present for 1-2 weeks. Patient also tested positive for COVID on Monday of this week. Patient stated diarrhea started before COVID diagnosis. Patient stated she is using the bathroom every hour. Patient stated she has been drinking water to stay hydrated. Patient stated she has been feeling weak and dizzy, but stated that could also be due to COVID. Patient has history of cerebral palsy. Advised patient to be seen within 4 hours, per protocol. Scheduled same day virtual UC appointment. Provided care advice and instructed patient to call back if symptoms worsen. Patient complied.   Reason for Disposition  [1] SEVERE diarrhea (e.g., 7 or more times / day more than normal) AND [2] present > 24 hours (1 day)  Answer Assessment - Initial Assessment Questions 1. DIARRHEA SEVERITY: "How bad is the diarrhea?" "How many more stools have you had in the past 24 hours than normal?"    - NO DIARRHEA (SCALE 0)   - MILD (SCALE 1-3): Few loose or mushy BMs; increase of 1-3 stools over normal daily number of stools; mild increase in ostomy output.   -  MODERATE (SCALE 4-7): Increase of 4-6 stools daily over normal; moderate increase in ostomy output.   -  SEVERE (SCALE 8-10; OR "WORST POSSIBLE"): Increase of 7 or more stools daily  over normal; moderate increase in ostomy output; incontinence.     States she is having diarrhea about every hour 2. ONSET: "When did the diarrhea begin?"      1-2- weeks 3. BM CONSISTENCY: "How loose or watery is the diarrhea?"      "Loose" 4. VOMITING: "Are you also vomiting?" If Yes, ask: "How many times in the past 24 hours?"      Denies 5. ABDOMEN PAIN: "Are you having any abdomen pain?" If Yes, ask: "What does it feel like?" (e.g., crampy, dull, intermittent, constant)      States pain comes and goes, feels crampy 6. ABDOMEN PAIN SEVERITY: If present, ask: "How bad is the pain?"  (e.g., Scale 1-10; mild, moderate, or severe)   - MILD (1-3): doesn't interfere with normal activities, abdomen soft and not tender to touch    - MODERATE (4-7): interferes with normal activities or awakens from sleep, abdomen tender to touch    - SEVERE (8-10): excruciating pain, doubled over, unable to do any normal activities       Rates pain a 7 at this time 7. ORAL INTAKE: If vomiting, "Have you been able to drink liquids?" "How much liquids have you had in the past 24 hours?"     States she has been able to drink water to stay hydrated 8. HYDRATION: "Any signs of dehydration?" (e.g., dry mouth [not just dry lips], too weak to stand, dizziness, new weight loss) "When did you last urinate?"     States she has been feeling weak and dizzy for the past few days, also  has COVID and CP 10. ANTIBIOTIC USE: "Are you taking antibiotics now or have you taken antibiotics in the past 2 months?"       Denies 11. OTHER SYMPTOMS: "Do you have any other symptoms?" (e.g., fever, blood in stool)       Low grade fever off an on, COVID symptoms- runny nose, chills and sore throat  Protocols used: Diarrhea-A-AH

## 2023-09-01 DIAGNOSIS — Z794 Long term (current) use of insulin: Secondary | ICD-10-CM | POA: Diagnosis not present

## 2023-09-01 DIAGNOSIS — E119 Type 2 diabetes mellitus without complications: Secondary | ICD-10-CM | POA: Diagnosis not present

## 2023-09-01 DIAGNOSIS — E1165 Type 2 diabetes mellitus with hyperglycemia: Secondary | ICD-10-CM | POA: Diagnosis not present

## 2023-09-03 ENCOUNTER — Other Ambulatory Visit: Payer: Self-pay

## 2023-09-03 DIAGNOSIS — Z206 Contact with and (suspected) exposure to human immunodeficiency virus [HIV]: Secondary | ICD-10-CM

## 2023-09-03 DIAGNOSIS — E1169 Type 2 diabetes mellitus with other specified complication: Secondary | ICD-10-CM

## 2023-09-03 DIAGNOSIS — Z794 Long term (current) use of insulin: Secondary | ICD-10-CM

## 2023-09-03 DIAGNOSIS — Z113 Encounter for screening for infections with a predominantly sexual mode of transmission: Secondary | ICD-10-CM

## 2023-09-04 ENCOUNTER — Other Ambulatory Visit

## 2023-09-04 ENCOUNTER — Ambulatory Visit: Admitting: Gastroenterology

## 2023-09-04 DIAGNOSIS — E785 Hyperlipidemia, unspecified: Secondary | ICD-10-CM | POA: Diagnosis not present

## 2023-09-04 DIAGNOSIS — Z794 Long term (current) use of insulin: Secondary | ICD-10-CM | POA: Diagnosis not present

## 2023-09-04 DIAGNOSIS — E1169 Type 2 diabetes mellitus with other specified complication: Secondary | ICD-10-CM | POA: Diagnosis not present

## 2023-09-05 ENCOUNTER — Ambulatory Visit: Attending: Physical Medicine and Rehabilitation | Admitting: Physical Therapy

## 2023-09-05 ENCOUNTER — Ambulatory Visit

## 2023-09-05 ENCOUNTER — Encounter: Payer: Self-pay | Admitting: Family Medicine

## 2023-09-05 DIAGNOSIS — M542 Cervicalgia: Secondary | ICD-10-CM | POA: Insufficient documentation

## 2023-09-05 DIAGNOSIS — M25552 Pain in left hip: Secondary | ICD-10-CM | POA: Diagnosis not present

## 2023-09-05 LAB — HEMOGLOBIN A1C
Hgb A1c MFr Bld: 9.6 % — ABNORMAL HIGH (ref <5.7)
Mean Plasma Glucose: 229 mg/dL
eAG (mmol/L): 12.7 mmol/L

## 2023-09-05 LAB — RPR: RPR Ser Ql: NONREACTIVE

## 2023-09-05 LAB — LIPID PANEL
Cholesterol: 226 mg/dL — ABNORMAL HIGH (ref <200)
HDL: 60 mg/dL (ref >=50)
LDL Cholesterol (Calc): 132 mg/dL — ABNORMAL HIGH
Non-HDL Cholesterol (Calc): 166 mg/dL — ABNORMAL HIGH (ref <130)
Total CHOL/HDL Ratio: 3.8 (calc) (ref <5.0)
Triglycerides: 198 mg/dL — ABNORMAL HIGH (ref <150)

## 2023-09-05 LAB — HIV ANTIBODY (ROUTINE TESTING W REFLEX): HIV 1&2 Ab, 4th Generation: NONREACTIVE

## 2023-09-05 LAB — TSH: TSH: 1.87 m[IU]/L

## 2023-09-05 LAB — MICROALBUMIN / CREATININE URINE RATIO
Creatinine, Urine: 100 mg/dL (ref 20–275)
Microalb Creat Ratio: 13 mg/g{creat} (ref <30)
Microalb, Ur: 1.3 mg/dL

## 2023-09-05 NOTE — Therapy (Signed)
 OUTPATIENT PHYSICAL THERAPY CERVICAL TREATMENT    Patient Name: Traci Davis MRN: [id number] DOB:29-Sep-1970, 53 y.o., female Today's Date: 09/05/2023  END OF SESSION:  PT End of Session - 09/05/23 1745     Visit Number 5    Number of Visits 20    Date for PT Re-Evaluation 10/01/23    Authorization Type [employer] Medicare Dual (VL based on mn)    Authorization - Visit Number 5    Authorization - Number of Visits 20    Progress Note Due on Visit 10    PT Start Time 1730    PT Stop Time 1815    PT Time Calculation (min) 45 min    Activity Tolerance Patient tolerated treatment well    Behavior During Therapy [facility] for tasks assessed/performed               Past Medical History:  Diagnosis Date   Acute pancreatitis 09/04/2014   Anxiety    Arthritis    Asthma    Cerebral palsy (HCC)    Complication of anesthesia    panic attacks before surgery   COPD (chronic obstructive pulmonary disease) (HCC)    Depression    Diabetes mellitus without complication (HCC)    GERD (gastroesophageal reflux disease)    Hypertension    Noninfectious gastroenteritis and colitis 01/10/2013   Past Surgical History:  Procedure Laterality Date   CESAREAN SECTION  1995   CHOLECYSTECTOMY     DILATATION & CURETTAGE/HYSTEROSCOPY WITH MYOSURE N/A 02/20/2018   Procedure: DILATATION & CURETTAGE/HYSTEROSCOPY WITH MYOSURE ENDOMETRIAL POLYPECTOMY;  Surgeon: Kris Pester, MD;  Location: [facility];  Service: Gynecology;  Laterality: N/A;   ESOPHAGOGASTRODUODENOSCOPY (EGD) WITH PROPOFOL  N/A 01/01/2019   Procedure: ESOPHAGOGASTRODUODENOSCOPY (EGD) WITH PROPOFOL ;  Surgeon: Selena Daily, MD;  Location: [facility];  Service: Gastroenterology;  Laterality: N/A;   FOOT CAPSULE RELEASE W/ PERCUTANEOUS HEEL CORD LENGTHENING, TIBIAL TENDON TRANSFER     age 71 ,and 2010-both legs   JOINT REPLACEMENT Right    both knees partial replacements dates unknown   knee replacment     x 5 per pt. last one- left  knee 2014. has had 2 on R 3 on L   TYMPANOPLASTY WITH GRAFT Right 01/08/2018   Procedure: TYMPANOPLASTY WITH POSSIBLE OSSICULAR CHAIN RECONSTRUCTION;  Surgeon: Von Grumbling, MD;  Location: [facility];  Service: ENT;  Laterality: Right;   Patient Active Problem List   Diagnosis Date Noted   Urge urinary incontinence 04/02/2023   Incontinence of feces 04/02/2023   Vaginal atrophy 04/02/2023   Asthma, persistent controlled 02/06/2023   Continuous leakage of urine 01/04/2023   De Quervain's tenosynovitis, right 10/08/2022   Hot flashes due to menopause 07/17/2022   Convulsions (HCC) 07/09/2022   Obesity due to excess calories 09/22/2020   Insomnia due to medical condition 05/11/2020   Hamstring tendonitis 01/29/2020   MDD (major depressive disorder), recurrent, in full remission (HCC) 08/12/2019   Seizure-like activity (HCC) 07/08/2019   Mild episode of recurrent major depressive disorder (HCC) 12/29/2018   Insomnia due to mental condition 12/29/2018   Disorder of vein 03/04/2018   Lumbar sprain 03/04/2018   Muscle weakness 03/04/2018   Neck sprain 03/04/2018   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   Postmenopausal bleeding 02/18/2018   Impingement syndrome of shoulder region 02/04/2018   Chest pain with moderate risk for cardiac etiology 05/19/2017   GAD (generalized anxiety disorder) 04/15/2017   Chronic daily headache 04/15/2017   Panic attack 04/15/2017   Nocturnal  enuresis 04/15/2017   Mild cognitive impairment 04/15/2017   Loss of memory 01/14/2017   Pain medication agreement signed 01/08/2017   Headache disorder 12/04/2016   Chronic, continuous use of opioids 07/01/2015   Vertigo 07/01/2015   Chronic midline low back pain without sciatica 03/21/2015   Type 2 diabetes mellitus with other specified complication (HCC) 09/05/2014   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   Hip pain, chronic 04/28/2013   Essential hypertension 01/10/2013   Irritable colon 01/10/2013    Encounter for long-term (current) use of other medications 12/04/2012   Chronic GERD 08/12/2012   Diarrhea 08/12/2012   Primary localized osteoarthrosis, lower leg 05/07/2012   Difficulty walking 02/06/2012    PCP: Dr. Domingo Friend   REFERRING PROVIDER: Dr. Arlean Bellow   REFERRING DIAG: Cervicalgia   THERAPY DIAG:  Cervicalgia  Rationale for Evaluation and Treatment: Rehabilitation  ONSET DATE: 2019 it started   SUBJECTIVE:                                                                                                                                                                                                         SUBJECTIVE STATEMENT: Pt states that she is just getting over a sickness. Her neck pain feels much improved because of ongoing iciing.  Hand dominance: Right  PERTINENT HISTORY:  Neck pain started around 5 years ago and it has occurred on her right side that is affected by cerebral palsy. She started having cervical radicular pain after receiving the most recent injection on February 12th. The pain travels down from her neck into the palm of her hand. She describes feeling numbness and pain. She went to [facility] for neuro   PAIN:  Are you having pain? Yes: NPRS scale: 7/10  Pain location: Right upper trap and radiates down lateral side of arm into palm of hand  Pain description: Sharp and shooting pain  Aggravating factors: Lifting phone up   Relieving factors: Heating pad   PRECAUTIONS: None  RED FLAGS: None     WEIGHT BEARING RESTRICTIONS: No  FALLS:  Has patient fallen in last 6 months? No  LIVING ENVIRONMENT: Lives with: lives with their partner Lives in: House/apartment Stairs: No Has following equipment at home: Otho Blitz - 2 wheeled, Wheelchair (manual), shower chair, and Grab bars  OCCUPATION: None   PLOF: Independent  PATIENT GOALS: Decrease pain   NEXT MD VISIT: May 14th   OBJECTIVE:  Note:  Objective measures were completed at Evaluation unless otherwise noted.  VITALS BP 130/77 HR 90 SpO22  DIAGNOSTIC FINDINGS:  None listed for cervical spine (Per pt's [facility] report - with left sided narrowing of foramin nerve compression at C6-C7  PATIENT SURVEYS:  NDI NT   COGNITION: Overall cognitive status: Within functional limits for tasks assessed  SENSATION: Decreased sensation in RUE   POSTURE: rounded shoulders  PALPATION: Right upper trap tender to palpate    CERVICAL ROM:   Active ROM A/PROM (deg) eval  Flexion 45  Extension 25/45  Right lateral flexion 20  Left lateral flexion 20  Right rotation 55*  Left rotation 55   (Blank rows = not tested)           UPPER EXTREMITY ROM:  AROM Right eval Left eval  Shoulder flexion    Shoulder extension    Shoulder abduction    Shoulder adduction    Shoulder extension    Shoulder internal rotation    Shoulder external rotation    Middle trapezius    Lower trapezius    Elbow flexion    Elbow extension    Wrist flexion    Wrist extension    Wrist ulnar deviation    Wrist radial deviation    Wrist pronation    Wrist supination    Grip strength     (Blank rows = not tested)  Combined Shoulder ER R/L NT  Combined Shoulder IR R/L NT   UPPER EXTREMITY MMT:  MMT Right eval Left eval  Shoulder flexion 4 4  Shoulder extension    Shoulder abduction 4 4  Shoulder adduction    Shoulder extension    Shoulder internal rotation 4 4  Shoulder external rotation 4- 4-  Elbow flexion 4 4  Elbow extension    Wrist flexion 4 4  Wrist extension 4 4  Wrist ulnar deviation 4 4  Wrist radial deviation 4 4  Wrist pronation 4 4  Wrist supination 4 4   (Blank rows = not tested) Grip Strength                                     Fair              Fair    CERVICAL SPECIAL TESTS:  Upper limb tension test (ULTT): NT and Distraction test: NT  Spurling's : Positive on Left    TREATMENT DATE: Patient  has a latex allergy    09/05/23: THEREX  OMEGA Seated Rows #5 1 x 10  OMEGA Seated Rows #10 1 x 10  -min VC to decrease posterior trunk lean  OMEGA Seated Rows #15 1 x 10  OMEGA Lat Pull Downs #15 2 x 10  -mod VC to contact top of chest and to perfomr Seated T's AROM 1 x 10 Seated T's #1 DB 1 x 10  Seated T's #3 DB 1 x 10   Seated Y's AROM  3 x 10    08/15/23  THEREX   Upper Trap Stretch with overpressure 4 x 60 sec  -mod VC to sequence exercise with increase left side rotation and side bend to stretch right upper trap  Seated Rows with Blue Theraband 2 x 10  Spurling's: Negative on RUE   MANUAL: Performed while using moist heat over upper traps   Right upper trap trigger point release and massage   Mid Trap and Rhomboid trigger point release and massage    [facility]  Use of theracane  for trigger point release   -How to apply pressure and not to release until tension dissipates    08/08/23: THEREX  UBE seat at 7- 2.5 forward and 2.5 backward   Scapular Retraction AROM 1 x 10  Seated Chin Tucks 1 x 10  -min VC to perform retraction instead of cervical flexion  Seated Shoulder Horizontal Abduction 2 x 10  *All supine exercises performed on occipital float* Supine Cervical Horizontal Rotations AROM 2 x 10  -Pt reports increased visual blurriness and dizziness  Supine Chin Tucks 2 x 10   PHYSICAL PERFORMANCE Head Impulse Test   -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   MANUAL THERAPY Upper trap massage and trigger point release -palpable trigger point on right upper trap is incredibly pain to touch for patient     08/01/23:  THEREX UBE with seat at 8 with resistance at 2 -2.5 min forward and 2.5 min backward  NDI: 38/50 (76%)  All Exercises performed on occipital float   Supine Cervical Rot AROM 3 x 10  Supine Cervical Flex and Ext AROM 3 x 10   All Exercises performed on occipital float with use of TENS  -Waveform-Inferential   -Frequency 80/150   -Volts CV 9.0 9.0 with box pattern  Supine Cervical Rot AROM 3 x 10  Supine Cervical Flex and Ext AROM 3 x 10  -Pt reports decreased right sided neck pain Supine Chin Tuck 3 sec hold 2 x 10    07/23/23:                                                                                                                              Upper trap stretch 2 x 60 sec  -Pt reports increased pain when stretching to left side    PATIENT EDUCATION:  Education details: Form and technique for correct performance of exercise.  Person educated: Patient Education method: Explanation, Verbal cues, and Handouts Education comprehension: verbalized understanding, returned demonstration, verbal cues required, and tactile cues required  HOME EXERCISE PROGRAM: Access Code: [email or web address] URL: [email or web address] Date: 09/05/2023 Prepared by: Marge Shed  Exercises - Seated Upper Trapezius Stretch  - 1 x daily - 7 x weekly - 3 reps - 30-60 sec  hold - Seated Shoulder Horizontal Abduction - Thumbs Up  - 3-4 x weekly - 3 sets - 10 reps - Seated Row Cable Machine  - 3-4 x weekly - 3 sets - 10 reps - Lat Pull Down  - 3-4 x weekly - 3 sets - 10 reps   ASSESSMENT:  CLINICAL IMPRESSION: Pt shows marked improvement with cervical function and decreased pain with ability to perform periscapular exercises with increased resistance and added gravity positions. Pt encouraged to continue to ice neck and PT will reassess goals next session to determine potential discharge with transition to gym and home program. She will continue to benefit from skilled PT to address these aforementioned deficits to be able to carry  out UE tasks like self-grooming and home cleaning and cooking tasks without excessive pain and discomfort.   OBJECTIVE IMPAIRMENTS: decreased ROM, decreased strength, impaired sensation, impaired UE functional use, and pain.   ACTIVITY LIMITATIONS: carrying, lifting,  bending, bathing, dressing, reach over head, and hygiene/grooming  PARTICIPATION LIMITATIONS: meal prep, cleaning, laundry, shopping, and community activity  PERSONAL FACTORS: Age, Education, Sex, Social background, Time since onset of injury/illness/exacerbation, Transportation, and 3+ comorbidities: Cerebral Palsy, HTN, T2DM are also affecting patient's functional outcome.   REHAB POTENTIAL: Fair chronicity of neck pain and high number of co-morbidities   CLINICAL DECISION MAKING: Stable/uncomplicated  EVALUATION COMPLEXITY: Low   GOALS: Goals reviewed with patient? No  SHORT TERM GOALS: Target date: 08/06/2023  Patient will demonstrate undestanding of home exercise plan by performing exercises correctly with evidence of good carry over with min to no verbal or tactile cues .   Baseline: NT 4/425: Performing exercises independently  Goal status: ACHIEVED       LONG TERM GOALS: Target date: 10/02/2023   Patient will show a statistically significant improvement in her neck function as evidence by >=7.5 pt increase in her neck disability index score to better be able to move neck to improve visual field while driving to avoid accidents. Linder Revere et al, 2009) Baseline: 38/50 (76%) Goal status: ONGOING   2.  Patient will demonstrate improved cervical AROM that are within 10% of normative values for improved cervical mobility and function for improved pain response to activity.  Baseline: Cervical Ext 25, Cervical Rot R/L 20*,  Goal status: ONGOING   3. Patient will demonstrate an improvement in her shoulder periscapular strength as evidenced by an increase >=1/3 grade MMT (4- to 4) for improved cervical spine stability and function to better be able to move neck to increase visual field by turning head to avoid accidents.            Baseline:             MMT Right eval Left eval  Shoulder flexion 4 4  Shoulder extension    Shoulder abduction 4 4  Shoulder adduction    Shoulder  extension    Shoulder internal rotation 4 4  Shoulder external rotation 4- 4-  Mid Trap     Rhomboids     Lower Trap     Elbow flexion 4 4  Elbow extension    Wrist flexion 4 4  Wrist extension 4 4  Wrist ulnar deviation 4 4  Wrist radial deviation 4 4  Wrist pronation 4 4  Wrist supination 4 4   (Blank rows = not tested) Grip Strength                                     Fair              Fair            Goal status: ONGOING       PLAN:  PT FREQUENCY: 1-2x/week  PT DURATION: 10 weeks  PLANNED INTERVENTIONS: 97164- PT Re-evaluation, 97110-Therapeutic exercises, 97530- Therapeutic activity, 97112- Neuromuscular re-education, 97535- Self Care, 16109- Manual therapy, [id number]- Gait training, [phone]- Orthotic Fit/training, [id number]- Prosthetic training, [phone]- Aquatic Therapy, [phone]- Electrical stimulation (unattended), [phone]- Electrical stimulation (manual), [id number]- Traction (mechanical), Patient/Family education, Dry Needling, Joint mobilization, Joint manipulation, Spinal manipulation, Spinal mobilization, Cryotherapy, and Moist heat  PLAN FOR NEXT SESSION: Continue with trigger  point release and massage of upper trap and introduce levator scap stretch and mid trap stretch. Eye exam using Snellen chart. Ongoing vestibular function testing: Finger Nose test for nystagmus. Ongoing progression of periscapular strength exercises: D2 Flexion and Extension in supine.   Marge Shed PT, DPT  [facility] [address] [place], [address] Phone: [phone]   Fax:  [phone]

## 2023-09-06 MED ORDER — INSULIN GLARGINE 100 UNIT/ML SOLOSTAR PEN: 34.0000 [IU] | PEN_INJECTOR | Freq: Every day | SUBCUTANEOUS | 0 refills | Status: DC

## 2023-09-09 ENCOUNTER — Ambulatory Visit: Admitting: Physical Therapy

## 2023-09-09 DIAGNOSIS — M542 Cervicalgia: Secondary | ICD-10-CM | POA: Diagnosis not present

## 2023-09-09 DIAGNOSIS — M25552 Pain in left hip: Secondary | ICD-10-CM | POA: Diagnosis not present

## 2023-09-09 NOTE — Therapy (Signed)
 OUTPATIENT PHYSICAL THERAPY CERVICAL TREATMENT    Patient Name: Traci Davis MRN: [id number] DOB:11/26/1970, 53 y.o., female Today's Date: 09/09/2023  END OF SESSION:  PT End of Session - 09/09/23 1529     Visit Number 6    Number of Visits 20    Date for PT Re-Evaluation 10/01/23    Authorization Type [employer] Medicare Dual (VL based on mn)    Authorization - Visit Number 6    Authorization - Number of Visits 20    Progress Note Due on Visit 10    PT Start Time 1520    PT Stop Time 1600    PT Time Calculation (min) 40 min    Activity Tolerance Patient tolerated treatment well    Behavior During Therapy [facility] for tasks assessed/performed               Past Medical History:  Diagnosis Date   Acute pancreatitis 09/04/2014   Anxiety    Arthritis    Asthma    Cerebral palsy (HCC)    Complication of anesthesia    panic attacks before surgery   COPD (chronic obstructive pulmonary disease) (HCC)    Depression    Diabetes mellitus without complication (HCC)    GERD (gastroesophageal reflux disease)    Hypertension    Noninfectious gastroenteritis and colitis 01/10/2013   Past Surgical History:  Procedure Laterality Date   CESAREAN SECTION  1995   CHOLECYSTECTOMY     DILATATION & CURETTAGE/HYSTEROSCOPY WITH MYOSURE N/A 02/20/2018   Procedure: DILATATION & CURETTAGE/HYSTEROSCOPY WITH MYOSURE ENDOMETRIAL POLYPECTOMY;  Surgeon: Kris Pester, MD;  Location: [facility];  Service: Gynecology;  Laterality: N/A;   ESOPHAGOGASTRODUODENOSCOPY (EGD) WITH PROPOFOL  N/A 01/01/2019   Procedure: ESOPHAGOGASTRODUODENOSCOPY (EGD) WITH PROPOFOL ;  Surgeon: Selena Daily, MD;  Location: [facility];  Service: Gastroenterology;  Laterality: N/A;   FOOT CAPSULE RELEASE W/ PERCUTANEOUS HEEL CORD LENGTHENING, TIBIAL TENDON TRANSFER     age 65 ,and 2010-both legs   JOINT REPLACEMENT Right    both knees partial replacements dates unknown   knee replacment     x 5 per pt. last one- left  knee 2014. has had 2 on R 3 on L   TYMPANOPLASTY WITH GRAFT Right 01/08/2018   Procedure: TYMPANOPLASTY WITH POSSIBLE OSSICULAR CHAIN RECONSTRUCTION;  Surgeon: Von Grumbling, MD;  Location: [facility];  Service: ENT;  Laterality: Right;   Patient Active Problem List   Diagnosis Date Noted   Urge urinary incontinence 04/02/2023   Incontinence of feces 04/02/2023   Vaginal atrophy 04/02/2023   Asthma, persistent controlled 02/06/2023   Continuous leakage of urine 01/04/2023   De Quervain's tenosynovitis, right 10/08/2022   Hot flashes due to menopause 07/17/2022   Convulsions (HCC) 07/09/2022   Obesity due to excess calories 09/22/2020   Insomnia due to medical condition 05/11/2020   Hamstring tendonitis 01/29/2020   MDD (major depressive disorder), recurrent, in full remission (HCC) 08/12/2019   Seizure-like activity (HCC) 07/08/2019   Mild episode of recurrent major depressive disorder (HCC) 12/29/2018   Insomnia due to mental condition 12/29/2018   Disorder of vein 03/04/2018   Lumbar sprain 03/04/2018   Muscle weakness 03/04/2018   Neck sprain 03/04/2018   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   Postmenopausal bleeding 02/18/2018   Impingement syndrome of shoulder region 02/04/2018   Chest pain with moderate risk for cardiac etiology 05/19/2017   GAD (generalized anxiety disorder) 04/15/2017   Chronic daily headache 04/15/2017   Panic attack 04/15/2017   Nocturnal  enuresis 04/15/2017   Mild cognitive impairment 04/15/2017   Loss of memory 01/14/2017   Pain medication agreement signed 01/08/2017   Headache disorder 12/04/2016   Chronic, continuous use of opioids 07/01/2015   Vertigo 07/01/2015   Chronic midline low back pain without sciatica 03/21/2015   Type 2 diabetes mellitus with other specified complication (HCC) 09/05/2014   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   Hip pain, chronic 04/28/2013   Essential hypertension 01/10/2013   Irritable colon 01/10/2013    Encounter for long-term (current) use of other medications 12/04/2012   Chronic GERD 08/12/2012   Diarrhea 08/12/2012   Primary localized osteoarthrosis, lower leg 05/07/2012   Difficulty walking 02/06/2012    PCP: Dr. Domingo Friend   REFERRING PROVIDER: Dr. Arlean Bellow   REFERRING DIAG: Cervicalgia   THERAPY DIAG:  Cervicalgia  Rationale for Evaluation and Treatment: Rehabilitation  ONSET DATE: 2019 it started   SUBJECTIVE:                                                                                                                                                                                                         SUBJECTIVE STATEMENT: Pt reports ongoing improvement in neck pain but now she has increased right patellar pain.   Hand dominance: Right  PERTINENT HISTORY:  Neck pain started around 5 years ago and it has occurred on her right side that is affected by cerebral palsy. She started having cervical radicular pain after receiving the most recent injection on February 12th. The pain travels down from her neck into the palm of her hand. She describes feeling numbness and pain. She went to [facility] for neuro   PAIN:  Are you having pain? Yes: NPRS scale: 8/10  Pain location: Right patella or right anterior knee pain   Pain description: Sharp and shooting pain  Aggravating factors: Rainy weather    Relieving factors: Heating pad   PRECAUTIONS: None  RED FLAGS: None     WEIGHT BEARING RESTRICTIONS: No  FALLS:  Has patient fallen in last 6 months? No  LIVING ENVIRONMENT: Lives with: lives with their partner Lives in: House/apartment Stairs: No Has following equipment at home: Otho Blitz - 2 wheeled, Wheelchair (manual), shower chair, and Grab bars  OCCUPATION: None   PLOF: Independent  PATIENT GOALS: Decrease pain   NEXT MD VISIT: May 14th   OBJECTIVE:  Note: Objective measures were completed at Evaluation unless  otherwise noted.  VITALS BP 130/77 HR 90 SpO22   DIAGNOSTIC FINDINGS:  None listed for cervical spine (  Per pt's [facility] report - with left sided narrowing of foramin nerve compression at C6-C7  PATIENT SURVEYS:  NDI NT   COGNITION: Overall cognitive status: Within functional limits for tasks assessed  SENSATION: Decreased sensation in RUE   POSTURE: rounded shoulders  PALPATION: Right upper trap tender to palpate    CERVICAL ROM:   Active ROM A/PROM (deg) eval  Flexion 45  Extension 25/45  Right lateral flexion 20  Left lateral flexion 20  Right rotation 55*  Left rotation 55   (Blank rows = not tested)           UPPER EXTREMITY ROM:  AROM Right eval Left eval  Shoulder flexion    Shoulder extension    Shoulder abduction    Shoulder adduction    Shoulder extension    Shoulder internal rotation    Shoulder external rotation    Middle trapezius    Lower trapezius    Elbow flexion    Elbow extension    Wrist flexion    Wrist extension    Wrist ulnar deviation    Wrist radial deviation    Wrist pronation    Wrist supination    Grip strength     (Blank rows = not tested)  Combined Shoulder ER R/L NT  Combined Shoulder IR R/L NT   UPPER EXTREMITY MMT:  MMT Right eval Left eval  Shoulder flexion 4 4  Shoulder extension    Shoulder abduction 4 4  Shoulder adduction    Shoulder extension    Shoulder internal rotation 4 4  Shoulder external rotation 4- 4-  Elbow flexion 4 4  Elbow extension    Wrist flexion 4 4  Wrist extension 4 4  Wrist ulnar deviation 4 4  Wrist radial deviation 4 4  Wrist pronation 4 4  Wrist supination 4 4   (Blank rows = not tested) Grip Strength                                     Fair              Fair    CERVICAL SPECIAL TESTS:  Upper limb tension test (ULTT): NT and Distraction test: NT  Spurling's : Positive on Left    TREATMENT DATE: Patient has a latex allergy    09/09/23: Bilateral D2 PNF  Flexion and Extension 3 x 10   -min VC to increase shoulder flexion beyond 90 degrees   Supine Serratus Punches with #5 DB  3 x 10  Supine Chest Press with #5 DB 2 x 10   Right Knee Palpation: Lateral joint line and Gerdy's Tubercle TTP  Varus and valgus test: Negative on right knee   Side Lying Hip Abduction in 30 deg flexion on RLE 1 x 10  -Pt reports no increase in her lateral knee pain   09/05/23: THEREX  OMEGA Seated Rows #5 1 x 10  OMEGA Seated Rows #10 1 x 10  -min VC to decrease posterior trunk lean  OMEGA Seated Rows #15 1 x 10  OMEGA Lat Pull Downs #15 2 x 10  -mod VC to contact top of chest and to perfomr Seated T's AROM 1 x 10 Seated T's #1 DB 1 x 10  Seated T's #3 DB 1 x 10   Seated Y's AROM  3 x 10    08/15/23  THEREX   Upper Trap  Stretch with overpressure 4 x 60 sec  -mod VC to sequence exercise with increase left side rotation and side bend to stretch right upper trap  Seated Rows with Blue Theraband 2 x 10  Spurling's: Negative on RUE   MANUAL: Performed while using moist heat over upper traps   Right upper trap trigger point release and massage   Mid Trap and Rhomboid trigger point release and massage    [facility]NAG[facility]  Use of theracane for trigger point release   -How to apply pressure and not to release until tension dissipates    08/08/23: THEREX  UBE seat at 7- 2.5 forward and 2.5 backward   Scapular Retraction AROM 1 x 10  Seated Chin Tucks 1 x 10  -min VC to perform retraction instead of cervical flexion  Seated Shoulder Horizontal Abduction 2 x 10  *All supine exercises performed on occipital float* Supine Cervical Horizontal Rotations AROM 2 x 10  -Pt reports increased visual blurriness and dizziness  Supine Chin Tucks 2 x 10   PHYSICAL PERFORMANCE Head Impulse Test   -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  Person educated: Patient Education method: Explanation, Verbal cues, and Handouts Education comprehension: verbalized understanding, returned demonstration, verbal cues required, and tactile cues required  HOME EXERCISE PROGRAM: Access Code: [email or web address] URL: [email or web address] Date: 09/09/2023 Prepared by: Marge Shed  Exercises - Seated Upper Trapezius Stretch  - 1 x daily - 7 x weekly - 3 reps - 30-60 sec  hold - Shoulder PNF D2 Flexion  - 3-4 x weekly - 3 sets - 10 reps - Supine Chest Press with Dumbbells  - 3-4 x weekly - 3 sets - 10 reps - Seated Shoulder Horizontal Abduction - Thumbs Up  - 3-4 x weekly - 3 sets - 10 reps - Seated Row Cable Machine  - 3-4 x weekly - 3 sets - 10 reps - Lat Pull Down  - 3-4 x weekly - 3 sets - 10 reps   ASSESSMENT:  CLINICAL IMPRESSION: Pt is nearing end of plan of care with ongoing improvement in neck pain. She was able to complete all periscapular strengthening exercises without an increase in her pain. Pt did report increased pain along the lateral joint line of the right knee and Gerdy's tubercle indicating OA and potential for IT band related issues. Ligamentous joint injury ruled out during the session. She will continue to benefit from skilled PT to address these aforementioned deficits to be able to carry out UE tasks like self-grooming and home cleaning and cooking tasks without excessive pain and discomfort.   OBJECTIVE IMPAIRMENTS: decreased ROM, decreased strength, impaired sensation, impaired UE functional use, and pain.   ACTIVITY LIMITATIONS: carrying, lifting, bending, bathing, dressing, reach over head, and hygiene/grooming  PARTICIPATION LIMITATIONS: meal prep, cleaning, laundry, shopping, and community activity  PERSONAL FACTORS: Age, Education, Sex, Social background,  Time since onset of injury/illness/exacerbation, Transportation, and 3+ comorbidities: Cerebral Palsy, HTN, T2DM are also affecting patient's functional outcome.   REHAB POTENTIAL: Fair chronicity of neck pain and high number of co-morbidities   CLINICAL DECISION MAKING: Stable/uncomplicated  EVALUATION COMPLEXITY: Low   GOALS: Goals reviewed with patient? No  SHORT TERM GOALS: Target date: 08/06/2023  Patient will  demonstrate undestanding of home exercise plan by performing exercises correctly with evidence of good carry over with min to no verbal or tactile cues .   Baseline: NT 4/425: Performing exercises independently  Goal status: ACHIEVED       LONG TERM GOALS: Target date: 10/02/2023   Patient will show a statistically significant improvement in her neck function as evidence by >=7.5 pt increase in her neck disability index score to better be able to move neck to improve visual field while driving to avoid accidents. Linder Revere et al, 2009) Baseline: 38/50 (76%) Goal status: ONGOING   2.  Patient will demonstrate improved cervical AROM that are within 10% of normative values for improved cervical mobility and function for improved pain response to activity.  Baseline: Cervical Ext 25, Cervical Rot R/L 20*,  Goal status: ONGOING   3. Patient will demonstrate an improvement in her shoulder periscapular strength as evidenced by an increase >=1/3 grade MMT (4- to 4) for improved cervical spine stability and function to better be able to move neck to increase visual field by turning head to avoid accidents.            Baseline:             MMT Right eval Left eval  Shoulder flexion 4 4  Shoulder extension    Shoulder abduction 4 4  Shoulder adduction    Shoulder extension    Shoulder internal rotation 4 4  Shoulder external rotation 4- 4-  Mid Trap     Rhomboids     Lower Trap     Elbow flexion 4 4  Elbow extension    Wrist flexion 4 4  Wrist extension 4 4  Wrist ulnar  deviation 4 4  Wrist radial deviation 4 4  Wrist pronation 4 4  Wrist supination 4 4   (Blank rows = not tested) Grip Strength                                     Fair              Fair            Goal status: ONGOING       PLAN:  PT FREQUENCY: 1-2x/week  PT DURATION: 10 weeks  PLANNED INTERVENTIONS: 97164- PT Re-evaluation, 97110-Therapeutic exercises, 97530- Therapeutic activity, [id number]- Neuromuscular re-education, 97535- Self Care, 40981- Manual therapy, [id number]- Gait training, [phone]- Orthotic Fit/training, [id number]- Prosthetic training, [phone]- Aquatic Therapy, [phone]- Electrical stimulation (unattended), [id number]- Electrical stimulation (manual), [id number]- Traction (mechanical), Patient/Family education, Dry Needling, Joint mobilization, Joint manipulation, Spinal manipulation, Spinal mobilization, Cryotherapy, and Moist heat  PLAN FOR NEXT SESSION: Reassess goals to determine discharge recommendations given ongoing progress.   Marge Shed PT, DPT  [facility] [address] [place], [address] Phone: [phone]   Fax:  [phone]

## 2023-09-11 DIAGNOSIS — G894 Chronic pain syndrome: Secondary | ICD-10-CM | POA: Diagnosis not present

## 2023-09-12 ENCOUNTER — Ambulatory Visit: Payer: Self-pay | Admitting: Family Medicine

## 2023-09-12 ENCOUNTER — Ambulatory Visit

## 2023-09-12 ENCOUNTER — Ambulatory Visit: Admitting: Physical Therapy

## 2023-09-16 ENCOUNTER — Other Ambulatory Visit (INDEPENDENT_AMBULATORY_CARE_PROVIDER_SITE_OTHER): Admitting: Pharmacist

## 2023-09-16 ENCOUNTER — Encounter: Payer: Self-pay | Admitting: Pharmacist

## 2023-09-16 ENCOUNTER — Telehealth: Payer: Self-pay | Admitting: Pharmacist

## 2023-09-16 ENCOUNTER — Ambulatory Visit (INDEPENDENT_AMBULATORY_CARE_PROVIDER_SITE_OTHER): Admitting: Gastroenterology

## 2023-09-16 VITALS — BP 123/81 | HR 76 | Temp 98.3°F | Wt 207.0 lb

## 2023-09-16 DIAGNOSIS — K8689 Other specified diseases of pancreas: Secondary | ICD-10-CM | POA: Diagnosis not present

## 2023-09-16 DIAGNOSIS — R7989 Other specified abnormal findings of blood chemistry: Secondary | ICD-10-CM | POA: Diagnosis not present

## 2023-09-16 DIAGNOSIS — K8681 Exocrine pancreatic insufficiency: Secondary | ICD-10-CM

## 2023-09-16 DIAGNOSIS — K760 Fatty (change of) liver, not elsewhere classified: Secondary | ICD-10-CM

## 2023-09-16 DIAGNOSIS — R197 Diarrhea, unspecified: Secondary | ICD-10-CM

## 2023-09-16 DIAGNOSIS — Z794 Long term (current) use of insulin: Secondary | ICD-10-CM

## 2023-09-16 DIAGNOSIS — R112 Nausea with vomiting, unspecified: Secondary | ICD-10-CM | POA: Diagnosis not present

## 2023-09-16 DIAGNOSIS — E1169 Type 2 diabetes mellitus with other specified complication: Secondary | ICD-10-CM

## 2023-09-16 NOTE — Telephone Encounter (Signed)
   Outreach Note  09/16/2023 Name: LAYTOYA ION MRN: [id number] DOB: 12/06/1970  Referred by: Raina Bunting, DO  Was unable to reach patient via telephone today and unable to leave a message as patient's voicemail box is full   Follow Up Plan: Will attempt to reach patient by telephone again within the next 14 days  Arthur Lash, PharmD, [facility] Clinical Pharmacist [facility] [phone]

## 2023-09-16 NOTE — Progress Notes (Signed)
 Karma Oz, MD [address]  [address]  [place], [place] [address]  Main: [phone]  Fax: [phone]    Gastroenterology Consultation  Referring Provider:     Domingo Friend * Primary Care Physician:  Raina Bunting, DO Primary Gastroenterologist:  Dr. Karma Oz Reason for Consultation: Chronic nausea, vomiting, chronic constipation        HPI:   Traci Davis is a 53 y.o. female referred by Dr. Romeo Co, Kayleen Party, DO  for consultation & management of chronic nausea and vomiting, chronic constipation.  Patient called our office because of recent worsening of symptoms of nausea and vomiting and she is concerned if she has flareup of gastroparesis.  Patient was told that she has gastroparesis, although she was not officially diagnosed with it based on gastric emptying study.  She takes promethazine  25 mg as needed which she thinks is not working.  She admits to drinking 2 diet Cokes daily and sweet tea 2-3 times daily.  She has craving for sweet and consumes 4-5 times daily.  She is taking omeprazole  40 mg twice daily which keeps her reflux under control.  She is taking Linzess 145 mcg daily to keep her constipation under control.  She reports abdominal bloating, regurgitation of food, chronic nausea.  She does have history of fatty liver.  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  Patient does admit to whenever she starts eating greasy foods, excess carbs and snacking and not strictly following low-fat low-carb diet she notices recurrence of the symptoms.  She continues to take Creon .  Generally, for 6 months she does well and then eats what ever she wants which results in recurrence of symptoms.  Her A1c is 9.6 earlier this month.  Has been progressively worsening.  She is  no longer suffering from constipation.  Has stopped taking Linzess  NSAIDs: None  Antiplts/Anticoagulants/Anti thrombotics: None  GI Procedures:  Upper endoscopy 02/06/2021  - Esophageal plaques were found, suspicious for                         candidiasis. Cells for cytology obtained.                         - Normal mucosa was found in the entire esophagus.                         Biopsied. Dilated.                         - Z-line regular, 36 cm from the incisors.                         - Multiple fundic gland polyps.                         - Erythematous mucosa in the stomach. Biopsied.                         - Normal examined duodenum.  Diagnosis   A: Stomach, biopsy -Gastric mucosa with changes consistent with mild reactive gastropathy -No Helicobacter pylori identified on  H&E stain -No evidence of metaplasia or dysplasia   B: Esophagus, biopsy -Fragments of esophageal squamous epithelium with no diagnostic alterations including no evidence of metaplasia or dysplasia (no significant glandular mucosal component included) -PAS stain negative for fungal elements     Past Medical History:  Diagnosis Date   Acute pancreatitis 09/04/2014   Anxiety    Arthritis    Asthma    Cerebral palsy (HCC)    Complication of anesthesia    panic attacks before surgery   COPD (chronic obstructive pulmonary disease) (HCC)    Depression    Diabetes mellitus without complication (HCC)    GERD (gastroesophageal reflux disease)    Hypertension    Noninfectious gastroenteritis and colitis 01/10/2013    Past Surgical History:  Procedure Laterality Date   CESAREAN SECTION  1995   CHOLECYSTECTOMY     DILATATION & CURETTAGE/HYSTEROSCOPY WITH MYOSURE N/A 02/20/2018   Procedure: DILATATION & CURETTAGE/HYSTEROSCOPY WITH MYOSURE ENDOMETRIAL POLYPECTOMY;  Surgeon: Kris Pester, MD;  Location: [facility];  Service: Gynecology;  Laterality: N/A;   ESOPHAGOGASTRODUODENOSCOPY (EGD) WITH  PROPOFOL  N/A 01/01/2019   Procedure: ESOPHAGOGASTRODUODENOSCOPY (EGD) WITH PROPOFOL ;  Surgeon: Selena Daily, MD;  Location: [facility];  Service: Gastroenterology;  Laterality: N/A;   FOOT CAPSULE RELEASE W/ PERCUTANEOUS HEEL CORD LENGTHENING, TIBIAL TENDON TRANSFER     age 28 ,and 2010-both legs   JOINT REPLACEMENT Right    both knees partial replacements dates unknown   knee replacment     x 5 per pt. last one- left knee 2014. has had 2 on R 3 on L   TYMPANOPLASTY WITH GRAFT Right 01/08/2018   Procedure: TYMPANOPLASTY WITH POSSIBLE OSSICULAR CHAIN RECONSTRUCTION;  Surgeon: Von Grumbling, MD;  Location: [facility];  Service: ENT;  Laterality: Right;     Current Outpatient Medications:    acetaminophen  (TYLENOL ) 325 MG tablet, Take 2 tablets (650 mg total) by mouth every 6 (six) hours as needed., Disp: 60 tablet, Rfl: 0   albuterol  (PROVENTIL  HFA;VENTOLIN  HFA) 108 (90 BASE) MCG/ACT inhaler, Inhale 2 puffs into the lungs 4 (four) times daily as needed. For shortness of breath and/or wheezing, Disp: , Rfl:    amLODipine  (NORVASC ) 5 MG tablet, Take 1 tablet (5 mg total) by mouth daily., Disp: 90 tablet, Rfl: 3   atorvastatin  (LIPITOR) 20 MG tablet, Take 1 tablet (20 mg total) by mouth daily., Disp: 90 tablet, Rfl: 3   azelastine (ASTELIN) 0.1 % nasal spray, Place into both nostrils., Disp: , Rfl:    Blood Glucose Monitoring Suppl (GLUCOCOM BLOOD GLUCOSE MONITOR) DEVI, Test daily before all meals/snacks and once before bedtime., Disp: , Rfl:    budesonide-formoterol (SYMBICORT) 80-4.5 MCG/ACT inhaler, Inhale 2 puffs up to 4 times daily as needed for symptoms of wheezing or shortness of breath., Disp: , Rfl:    busPIRone  (BUSPAR ) 10 MG tablet, Take 1 tablet (10 mg total) by mouth 3 (three) times daily., Disp: 270 tablet, Rfl: 1   Continuous Glucose Sensor (DEXCOM G7 SENSOR) MISC, Apply Dexcom G7 sensor every 10 days. Type 2 Diabetes uncontrolled (E11.65), Disp: , Rfl:    CREON  36000-114000  units CPEP capsule, Take 3 capsules (108,000 Units total) by mouth 3 (three) times daily with meals., Disp: 300 capsule, Rfl: 5   dicyclomine  (BENTYL ) 10 MG capsule, Take 2 capsules in morning with meal and 1 capsule in evening with meal., Disp: 90 capsule, Rfl: 5   donepezil (ARICEPT) 10 MG tablet, Take 10 mg by mouth at bedtime., Disp: ,  Rfl:    estradiol (ESTRACE) 0.1 MG/GM vaginal cream, Place 1 Applicatorful vaginally 3 (three) times a week., Disp: , Rfl:    FLUoxetine  (PROZAC ) 20 MG capsule, Take 1 capsule (20 mg total) by mouth daily. Take along with 40 mg daily - total of 60 mg daily, Disp: 90 capsule, Rfl: 1   FLUoxetine  (PROZAC ) 40 MG capsule, TAKE 1 CAPSULE BY MOUTH ONCE DAILY . TAKE ALONG WITH 20MG  DAILY AS DIRECTED, Disp: 90 capsule, Rfl: 1   furosemide  (LASIX ) 40 MG tablet, Take 1 tablet (40 mg total) by mouth daily as needed for fluid., Disp: 30 tablet, Rfl: 5   gabapentin (NEURONTIN) 300 MG capsule, Take 600 mg by mouth 3 (three) times daily., Disp: , Rfl:    glucose blood test strip, USE TO TEST BLOOD SUGAR TWO TIMES A DAY, Disp: , Rfl:    GVOKE HYPOPEN  2-PACK 1 MG/0.2ML SOAJ, Inject 1 mg into the skin as needed (hypoglycemia)., Disp: 1 mL, Rfl: 0   incobotulinumtoxinA (XEOMIN) 100 units SOLR injection, Inject 100 Units into the muscle every 3 (three) months. , Disp: , Rfl:    Incontinence Supply Disposable (PREVAIL BLADDER CONTROL PAD) MISC, Large incontinence pads to go into underwear, Disp: , Rfl:    insulin  glargine (LANTUS ) 100 UNIT/ML Solostar Pen, Inject 34 Units into the skin daily. (Patient taking differently: Inject 36 Units into the skin daily.), Disp: 30 mL, Rfl: 0   Insulin  Pen Needle (GLOBAL EASE INJECT PEN NEEDLES) 31G X 5 MM MISC, Use pen needle to inject insulin  daily., Disp: 100 each, Rfl: 0   lamoTRIgine  (LAMICTAL ) 25 MG tablet, Take 1 tablet (25 mg total) by mouth daily., Disp: 90 tablet, Rfl: 1   Lancets (FREESTYLE) lancets, Test daily before all meals/snacks,  Disp: , Rfl:    lidocaine  (LIDODERM ) 5 %, Place 2 patches onto the skin daily., Disp: , Rfl:    lisinopril  (ZESTRIL ) 20 MG tablet, Take 1 tablet (20 mg total) by mouth daily., Disp: 90 tablet, Rfl: 3   metFORMIN  (GLUCOPHAGE -XR) 500 MG 24 hr tablet, Take 2 tablets (1,000 mg total) by mouth 2 (two) times daily., Disp: 360 tablet, Rfl: 3   methocarbamol  (ROBAXIN ) 500 MG tablet, Take 1 tablet (500 mg total) by mouth 3 (three) times daily as needed for muscle spasms., Disp: 90 tablet, Rfl: 5   mirabegron  ER (MYRBETRIQ ) 50 MG TB24 tablet, Take 1 tablet (50 mg total) by mouth daily., Disp: 30 tablet, Rfl: 3   mirtazapine  (REMERON ) 15 MG tablet, Take 1.5 tablets (22.5 mg total) by mouth at bedtime., Disp: 135 tablet, Rfl: 1   montelukast  (SINGULAIR ) 10 MG tablet, Take 1 tablet (10 mg total) by mouth at bedtime., Disp: 90 tablet, Rfl: 3   naloxone (NARCAN) nasal spray 4 mg/0.1 mL, , Disp: , Rfl:    nystatin (MYCOSTATIN/NYSTOP) powder, Apply topically., Disp: , Rfl:    omeprazole  (PRILOSEC) 40 MG capsule, Take 1 capsule (40 mg total) by mouth in the morning and at bedtime., Disp: 90 capsule, Rfl: 3   oxyCODONE -acetaminophen  (PERCOCET) 10-325 MG tablet, Take 1 tablet by mouth 3 (three) times daily as needed., Disp: , Rfl:    SUMAtriptan (IMITREX) 100 MG tablet, Take 100 mg by mouth as directed. As needed for migraines, Disp: , Rfl:    triamcinolone cream (KENALOG) 0.1 %, Apply 1 Application topically 3 (three) times daily as needed., Disp: , Rfl:    XTAMPZA  ER 13.5 MG C12A, Take 1 capsule by mouth 2 (two) times daily., Disp: ,  Rfl:    linaclotide (LINZESS) 72 MCG capsule, Take 72 mcg by mouth daily before breakfast. (Patient not taking: Reported on 09/18/2023), Disp: , Rfl:    Family History  Problem Relation Age of Onset   CAD Father    Heart attack Brother    Sexual abuse Paternal Grandfather    Breast cancer Neg Hx      Social History   Tobacco Use   Smoking status: Never   Smokeless tobacco:  Never  Vaping Use   Vaping status: Never Used  Substance Use Topics   Alcohol use: No   Drug use: No    Allergies as of 09/16/2023 - Review Complete 09/16/2023  Allergen Reaction Noted   Meloxicam Other (See Comments) 02/19/2015   Morphine and codeine Other (See Comments) 02/19/2015   Vantin [cefpodoxime] Nausea And Vomiting 02/19/2015   Latex Itching 01/08/2018   Baclofen Other (See Comments) 02/19/2015   Ciprofloxacin  Itching and Nausea And Vomiting 02/19/2015   Prednisone Other (See Comments) 10/02/2022   Tape Rash 02/19/2015   Tramadol Nausea Only 12/10/2019    Review of Systems:    All systems reviewed and negative except where noted in HPI.   Physical Exam:  BP 123/81 (BP Location: Left Arm, Patient Position: Sitting, Cuff Size: Large)   Pulse 76   Temp 98.3 F (36.8 C) (Oral)   Wt 207 lb (93.9 kg)   LMP 08/20/2014 Comment: missed cup when trying to obtain urine spec  BMI 37.86 kg/m  Patient's last menstrual period was 08/20/2014.  General:   Alert,  Well-developed, well-nourished, pleasant and cooperative in NAD Head:  Normocephalic and atraumatic. Eyes:  Sclera clear, no icterus.   Conjunctiva pink. Ears:  Normal auditory acuity. Nose:  No deformity, discharge, or lesions. Mouth:  No deformity or lesions,oropharynx pink & moist. Neck:  Supple; no masses or thyromegaly. Lungs:  Respirations even and unlabored.  Clear throughout to auscultation.   No wheezes, crackles, or rhonchi. No acute distress. Heart:  Regular rate and rhythm; no murmurs, clicks, rubs, or gallops. Abdomen:  Normal bowel sounds. Soft, obese, distended, tympanic non-tender and distended without masses, hepatosplenomegaly or hernias noted.  No guarding or rebound tenderness.   Rectal: Not performed Msk: Left lower extremity weakness from cerebral palsy, wearing a splint Pulses:  Normal pulses noted. Extremities:  No clubbing or edema.  No cyanosis. Neurologic:  Alert and oriented x3;  grossly  normal neurologically. Skin:  Intact without significant lesions or rashes. No jaundice. Psych:  Alert and cooperative. Normal mood and affect.  Imaging Studies: Reviewed  Assessment and Plan:   Traci Davis is a 53 y.o. female with metabolic syndrome, history of cerebral palsy with left lower extremity weakness, s/p cholecystectomy, fatty atrophy of the pancreas, hepatic steatosis, history chronic constipation is seen in consultation for episodic symptoms of nausea, vomiting, abdominal bloating and nonbloody diarrhea  Chronic nausea with intermittent vomiting Upper endoscopy at [facility] in 01/2021 was unremarkable including biopsies Discussed with patient that most of her symptoms are likely driven by her food habits, heavy consumption of carbonated beverages.  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  Check pancreatic fecal elastase levels  Chronically elevated LFTs with hepatic steatosis: In setting of metabolic syndrome Discussed with patient regarding tight control of diabetes, significance of low-fat, low-carb diet Recheck LFTs in 6 months    Follow up in 6 months   Karma Oz, MD

## 2023-09-16 NOTE — Patient Instructions (Signed)
 A low-carb diet limits the amount of carbohydrates you eat. Carbs are grouped as:  Simple natural, such as lactose in milk and fructose in fruit. Simple refined, such as table sugar. Complex natural, such as whole grains or beans. Complex refined, such as white flour.  Common sources of natural carbohydrates include: Grains. Fruits. Vegetables. Milk. Nuts. Seeds. Legumes, such as beans, lentils and peas.  In general, you digest complex carbs more slowly. Complex carbs also have less effect on blood sugar than refined carbs do. They also offer fiber.  Refined carbs such as sugar or white flour are often added to processed foods. Examples of foods with refined carbs are white breads and pasta, cookies, cake, candy, and sugar-sweetened sodas and drinks.  The body uses carbs as its main energy source. During digestion, complex carbs are broken down into simple sugars, also called glucose, and released into your blood. This is called blood glucose.  Insulin  is released to help glucose enter the body's cells, where it can be used for energy. Extra glucose is stored in the liver and in muscles. Some is changed to body fat.  A low-carb diet is meant to cause the body to burn stored fat for energy, which leads to weight loss.  Typical foods for a low-carb diet In broad terms, a low-carb diet focuses on proteins and some nonstarchy vegetables. A low-carb diet generally limits grains, legumes, fruits, breads, sweets, pastas and starchy vegetables, and sometimes nuts and seeds. But some low-carb diet plans allow small amounts of fruits, vegetables and whole grains.  A daily limit of 0.7 to 2 ounces (20 to 57 grams) of carbohydrates is typical with a low-carb diet. These amounts of carbohydrates provide 80 to 240 calories. Some low-carb diets greatly limit carbs during the early phase of the diet. Then those diets allow more carbs over time.  In contrast, the Dietary Guidelines for Americans  recommend that carbohydrates make up 45% to 65% of your total daily calorie intake. So if you eat or drink 2,000 calories a day, carbs would account for between 900 and 1,300 calories a day.  Results Weight loss Most people can lose weight if they limit calories and boost their physical activity. To lose 1 to 1.5 pounds (0.5 to 0.7 kilograms) a week, you need to eat 500 to 750 fewer calories each day.  Low-carb diets, especially very low-carb diets, may lead to greater short-term weight loss than do low-fat diets. But most studies have found that at 12 or 24 months, the benefits of a low-carb diet aren't very large.  Cutting calories and carbs may not be the only reason for the weight loss with low-carb diets. Some studies show that you may shed some weight because the extra protein and fat helps you feel full longer. Feeling full longer helps you eat less.  Other benefits Low-carb diets that focus on healthy sources of carbs, fat and protein may help lower the risk of type 2 diabetes and heart disease. In fact, almost any diet that helps you shed excess weight may improve blood sugar and cholesterol levels, at least in the short term.

## 2023-09-16 NOTE — Patient Instructions (Signed)
 Please follow up with [facility]  to schedule an appointment with the dietitian by calling: [phone]   The following is the web address for the handout on meal planning that we discussed (I will also ask for this to be mailed to you):   [email or web address]     If you have time before our next appointment, please use the following steps to share your Dexcom G7 data:   Within the Dexcom G7 App: - Open your Dexcom sensor app on your smartphone - Tap the Connections Tab - Tap [facility] and tap "Continue" - Enter the [facility] Code [email or web address] when prompted. - Confirm clinic details and tap "Done" when finished      I look forward to speaking with you again by telephone on 10/07/2023 at 1:30 PM   Arthur Lash, PharmD, [facility] Clinical Pharmacist [facility] [phone]

## 2023-09-16 NOTE — Progress Notes (Signed)
 09/16/2023 Name: Traci Davis MRN: [id number] DOB: [birth year]  Chief Complaint  Patient presents with   Medication Management    Traci Davis is a 53 y.o. year old female who presented for a telephone visit.   They were referred to the pharmacist by their PCP for assistance in managing complex medication management.      Subjective:   Care Team: Primary Care Provider: Raina Bunting, DO Neurology: Roby Chris, MD Pain Management: Debarah Faes, MD  Psychiatrist: Eappen, Saramma, MD; Next Scheduled Visit: 09/30/2023 GI Specialist: Selena Daily, MD  Pulmonologist: [facility]  Medication Access/Adherence  Current Pharmacy:  [facility], [place] - [address] [place] [address] Phone: [phone] Fax: [phone]   Patient reports affordability concerns with their medications: No  Patient reports access/transportation concerns to their pharmacy: No  Patient reports adherence concerns with their medications:  No     Using weekly pillbox as filled by her daughter  Per patient/review of chart, she was seen for Office Visit today with [place] [facility]   Diabetes:  Current medications:  - metformin  ER 500 - 2 tablets twice daily - Lantus  36 units daily - Reports increased following direction from PCP   Medications tried in the past/contraindications: Jardiance  (yeast infection); history of pancreatitis    Patient using Dexcom G7 continuous glucose monitor Current glucose readings: recent readings ranging 192-300s - Attributes higher readings to days like today when eats larger portions of carbohydrates/sugar. Reports today had waffles with sugar free syrup for breakfast, then crackers and has been drinking sweet tea throughout the day  Reports has not yet had appointment with dietitian. Patient states that she canceled this appointment when she was sick due to COVID-19  infection  Current dietary habits: - Breakfast/lunch: bowl of cereal or eggs and bacon - Supper: grilled chicken and baked potato and lima beans - Evening snack: fruit (watermelon), cucumbers - Drinks: water and sweet tea  Patient denies hypoglycemic s/sx including dizziness, shakiness, sweating.   Current physical activity: Doing neck exercises and exercises from physical therapy each evening   Statin therapy: atorvastatin  20 mg daily  Objective:  Lab Results  Component Value Date   HGBA1C 9.6 (H) 09/04/2023    Lab Results  Component Value Date   CREATININE 0.88 05/31/2023   BUN 12 05/31/2023   NA 139 05/31/2023   K 3.9 05/31/2023   CL 98 05/31/2023   CO2 27 05/31/2023    Lab Results  Component Value Date   CHOL 226 (H) 09/04/2023   HDL 60 09/04/2023   LDLCALC 132 (H) 09/04/2023   TRIG 198 (H) 09/04/2023   CHOLHDL 3.8 09/04/2023    Current Outpatient Medications on File Prior to Visit  Medication Sig Dispense Refill   insulin  glargine (LANTUS ) 100 UNIT/ML Solostar Pen Inject 34 Units into the skin daily. (Patient taking differently: Inject 36 Units into the skin daily.) 30 mL 0   metFORMIN  (GLUCOPHAGE -XR) 500 MG 24 hr tablet Take 2 tablets (1,000 mg total) by mouth 2 (two) times daily. 360 tablet 3   acetaminophen  (TYLENOL ) 325 MG tablet Take 2 tablets (650 mg total) by mouth every 6 (six) hours as needed. 60 tablet 0   albuterol  (PROVENTIL  HFA;VENTOLIN  HFA) 108 (90 BASE) MCG/ACT inhaler Inhale 2 puffs into the lungs 4 (four) times daily as needed. For shortness of breath and/or wheezing     amLODipine  (NORVASC ) 5 MG tablet Take  1 tablet (5 mg total) by mouth daily. 90 tablet 3   atorvastatin  (LIPITOR) 20 MG tablet Take 1 tablet (20 mg total) by mouth daily. 90 tablet 3   azelastine (ASTELIN) 0.1 % nasal spray Place into both nostrils.     Blood Glucose Monitoring Suppl (GLUCOCOM BLOOD GLUCOSE MONITOR) DEVI Test daily before all meals/snacks and once before bedtime.      budesonide-formoterol (SYMBICORT) 80-4.5 MCG/ACT inhaler Inhale 2 puffs up to 4 times daily as needed for symptoms of wheezing or shortness of breath.     busPIRone  (BUSPAR ) 10 MG tablet Take 1 tablet (10 mg total) by mouth 3 (three) times daily. 270 tablet 1   Continuous Glucose Sensor (DEXCOM G7 SENSOR) MISC Apply Dexcom G7 sensor every 10 days. Type 2 Diabetes uncontrolled (E11.65)     CREON  36000-114000 units CPEP capsule Take 3 capsules (108,000 Units total) by mouth 3 (three) times daily with meals. 300 capsule 5   dicyclomine  (BENTYL ) 10 MG capsule Take 2 capsules in morning with meal and 1 capsule in evening with meal. 90 capsule 5   donepezil (ARICEPT) 10 MG tablet Take 10 mg by mouth at bedtime.     estradiol (ESTRACE) 0.1 MG/GM vaginal cream Place 1 Applicatorful vaginally 3 (three) times a week.     FLUoxetine  (PROZAC ) 20 MG capsule Take 1 capsule (20 mg total) by mouth daily. Take along with 40 mg daily - total of 60 mg daily 90 capsule 1   FLUoxetine  (PROZAC ) 40 MG capsule TAKE 1 CAPSULE BY MOUTH ONCE DAILY . TAKE ALONG WITH 20MG  DAILY AS DIRECTED 90 capsule 1   furosemide  (LASIX ) 40 MG tablet Take 1 tablet (40 mg total) by mouth daily as needed for fluid. 30 tablet 5   gabapentin (NEURONTIN) 300 MG capsule Take 600 mg by mouth 3 (three) times daily.     glucose blood test strip USE TO TEST BLOOD SUGAR TWO TIMES A DAY     GVOKE HYPOPEN  2-PACK 1 MG/0.2ML SOAJ Inject 1 mg into the skin as needed (hypoglycemia). 1 mL 0   incobotulinumtoxinA (XEOMIN) 100 units SOLR injection Inject 100 Units into the muscle every 3 (three) months.      Incontinence Supply Disposable (PREVAIL BLADDER CONTROL PAD) MISC Large incontinence pads to go into underwear     Insulin  Pen Needle (GLOBAL EASE INJECT PEN NEEDLES) 31G X 5 MM MISC Use pen needle to inject insulin  daily. 100 each 0   lamoTRIgine  (LAMICTAL ) 25 MG tablet Take 1 tablet (25 mg total) by mouth daily. 90 tablet 1   Lancets (FREESTYLE)  lancets Test daily before all meals/snacks     lidocaine  (LIDODERM ) 5 % Place 2 patches onto the skin daily.     linaclotide (LINZESS) 72 MCG capsule Take 72 mcg by mouth daily before breakfast.     lisinopril  (ZESTRIL ) 20 MG tablet Take 1 tablet (20 mg total) by mouth daily. 90 tablet 3   methocarbamol  (ROBAXIN ) 500 MG tablet Take 1 tablet (500 mg total) by mouth 3 (three) times daily as needed for muscle spasms. 90 tablet 5   mirabegron  ER (MYRBETRIQ ) 50 MG TB24 tablet Take 1 tablet (50 mg total) by mouth daily. 30 tablet 3   mirtazapine  (REMERON ) 15 MG tablet Take 1.5 tablets (22.5 mg total) by mouth at bedtime. 135 tablet 1   montelukast  (SINGULAIR ) 10 MG tablet Take 1 tablet (10 mg total) by mouth at bedtime. 90 tablet 3   naloxone (NARCAN) nasal spray 4 mg/0.1  mL      nystatin (MYCOSTATIN/NYSTOP) powder Apply topically.     omeprazole  (PRILOSEC) 40 MG capsule Take 1 capsule (40 mg total) by mouth in the morning and at bedtime. 90 capsule 3   oxyCODONE -acetaminophen  (PERCOCET) 10-325 MG tablet Take 1 tablet by mouth 3 (three) times daily as needed.     SUMAtriptan (IMITREX) 100 MG tablet Take 100 mg by mouth as directed. As needed for migraines     triamcinolone cream (KENALOG) 0.1 % Apply 1 Application topically 3 (three) times daily as needed.     XTAMPZA  ER 13.5 MG C12A Take 1 capsule by mouth 2 (two) times daily.     No current facility-administered medications on file prior to visit.        Assessment/Plan:   Advise patient to follow dietary recommendation as provided by Dr. Baldomero Bone. Encourage patient to read through detailed information provided by GI Specialist in [facility].   Patient states received test kit from specialist to perform at home and plans to return this to [facility]  Patient to follow up with [facility] to schedule appointment with dietitian for help with healthy meal planning for blood sugar control/following GI recommendations and weight control as referred  by PCP  Diabetes: - Currently uncontrolled - Reviewed long term cardiovascular and renal outcomes of uncontrolled blood sugar - Reviewed goal A1c, goal fasting, and goal 2 hour post prandial glucose - Reviewed dietary modifications including importance of having regular well-balanced meals and snacks throughout the day, while controlling carbohydrate portion sizes             Encourage patient to avoid consumption of sugary beverages             Encourage patient to increase consumption of non-starchy vegetables    - Will send patient handout on healthy meal planning both via mail and MyChart as requested      - Patient to continue to follow direction from PCP from 05/21/2023 that she may "increase insulin  Lantus  from 30 up to + 2 units each week if fasting sugar is >150. Max dose is 40 units" - Recommend to continue to use Dexcom G7 CGM to monitor blood sugar/as feedback on dietary choices Patient to check glucose with fingerstick check when needed for symptoms and as back up to CGM. Patient to contact office if needed for readings outside of established parameters or symptoms  Follow Up Plan: Clinical Pharmacist will follow up with patient by telephone on 10/07/2023 at 1:30 PM     Arthur Lash, PharmD, [facility] [phone]

## 2023-09-18 ENCOUNTER — Ambulatory Visit: Admitting: Physical Therapy

## 2023-09-18 DIAGNOSIS — R1013 Epigastric pain: Secondary | ICD-10-CM | POA: Diagnosis not present

## 2023-09-18 DIAGNOSIS — R748 Abnormal levels of other serum enzymes: Secondary | ICD-10-CM | POA: Diagnosis not present

## 2023-09-18 DIAGNOSIS — E119 Type 2 diabetes mellitus without complications: Secondary | ICD-10-CM | POA: Diagnosis not present

## 2023-09-18 DIAGNOSIS — K861 Other chronic pancreatitis: Secondary | ICD-10-CM | POA: Diagnosis not present

## 2023-09-18 DIAGNOSIS — R10812 Left upper quadrant abdominal tenderness: Secondary | ICD-10-CM | POA: Diagnosis not present

## 2023-09-18 DIAGNOSIS — R7989 Other specified abnormal findings of blood chemistry: Secondary | ICD-10-CM | POA: Diagnosis not present

## 2023-09-18 DIAGNOSIS — R1032 Left lower quadrant pain: Secondary | ICD-10-CM | POA: Diagnosis not present

## 2023-09-18 DIAGNOSIS — I10 Essential (primary) hypertension: Secondary | ICD-10-CM | POA: Diagnosis not present

## 2023-09-18 DIAGNOSIS — E1165 Type 2 diabetes mellitus with hyperglycemia: Secondary | ICD-10-CM | POA: Diagnosis not present

## 2023-09-18 DIAGNOSIS — E871 Hypo-osmolality and hyponatremia: Secondary | ICD-10-CM | POA: Diagnosis not present

## 2023-09-18 DIAGNOSIS — R110 Nausea: Secondary | ICD-10-CM | POA: Diagnosis not present

## 2023-09-18 DIAGNOSIS — Z7722 Contact with and (suspected) exposure to environmental tobacco smoke (acute) (chronic): Secondary | ICD-10-CM | POA: Diagnosis not present

## 2023-09-19 ENCOUNTER — Encounter: Payer: Self-pay | Admitting: Family Medicine

## 2023-09-19 DIAGNOSIS — R110 Nausea: Secondary | ICD-10-CM

## 2023-09-20 DIAGNOSIS — K8681 Exocrine pancreatic insufficiency: Secondary | ICD-10-CM | POA: Diagnosis not present

## 2023-09-24 ENCOUNTER — Ambulatory Visit: Payer: Self-pay | Admitting: Family Medicine

## 2023-09-24 ENCOUNTER — Ambulatory Visit: Payer: Self-pay | Admitting: Gastroenterology

## 2023-09-24 DIAGNOSIS — H43393 Other vitreous opacities, bilateral: Secondary | ICD-10-CM | POA: Diagnosis not present

## 2023-09-24 DIAGNOSIS — H2513 Age-related nuclear cataract, bilateral: Secondary | ICD-10-CM | POA: Diagnosis not present

## 2023-09-24 DIAGNOSIS — H04123 Dry eye syndrome of bilateral lacrimal glands: Secondary | ICD-10-CM | POA: Diagnosis not present

## 2023-09-24 DIAGNOSIS — E119 Type 2 diabetes mellitus without complications: Secondary | ICD-10-CM | POA: Diagnosis not present

## 2023-09-24 LAB — PANCREATIC ELASTASE, FECAL: Pancreatic Elastase, Fecal: 124 ug Elast./g — ABNORMAL LOW (ref >200)

## 2023-09-24 LAB — HM DIABETES EYE EXAM

## 2023-09-24 MED ORDER — ONDANSETRON 4 MG PO TBDP: 4.0000 mg | ORAL_TABLET | Freq: Three times a day (TID) | ORAL | 2 refills | Status: AC | PRN

## 2023-09-25 ENCOUNTER — Ambulatory Visit: Admitting: Physical Therapy

## 2023-09-25 DIAGNOSIS — M25552 Pain in left hip: Secondary | ICD-10-CM

## 2023-09-25 DIAGNOSIS — M542 Cervicalgia: Secondary | ICD-10-CM | POA: Diagnosis not present

## 2023-09-25 NOTE — Therapy (Signed)
 OUTPATIENT PHYSICAL THERAPY CERVICAL DISCHARGE     Patient Name: Traci Davis MRN: [id number] DOB:[birth year], 53 y.o., female Today's Date: 09/25/2023  END OF SESSION:  PT End of Session - 09/25/23 1529     Visit Number 7    Number of Visits 20    Date for PT Re-Evaluation 10/01/23    Authorization Type [employer] Medicare Dual (VL based on mn)    Authorization - Visit Number 7    Authorization - Number of Visits 20    Progress Note Due on Visit 10    PT Start Time 1520    PT Stop Time 1600    PT Time Calculation (min) 40 min    Activity Tolerance Patient tolerated treatment well    Behavior During Therapy [facility] for tasks assessed/performed               Past Medical History:  Diagnosis Date   Acute pancreatitis 09/04/2014   Anxiety    Arthritis    Asthma    Cerebral palsy (HCC)    Complication of anesthesia    panic attacks before surgery   COPD (chronic obstructive pulmonary disease) (HCC)    Depression    Diabetes mellitus without complication (HCC)    GERD (gastroesophageal reflux disease)    Hypertension    Noninfectious gastroenteritis and colitis 01/10/2013   Past Surgical History:  Procedure Laterality Date   CESAREAN SECTION  1995   CHOLECYSTECTOMY     DILATATION & CURETTAGE/HYSTEROSCOPY WITH MYOSURE N/A 02/20/2018   Procedure: DILATATION & CURETTAGE/HYSTEROSCOPY WITH MYOSURE ENDOMETRIAL POLYPECTOMY;  Surgeon: Kris Pester, MD;  Location: [facility];  Service: Gynecology;  Laterality: N/A;   ESOPHAGOGASTRODUODENOSCOPY (EGD) WITH PROPOFOL  N/A 01/01/2019   Procedure: ESOPHAGOGASTRODUODENOSCOPY (EGD) WITH PROPOFOL ;  Surgeon: Selena Daily, MD;  Location: [facility];  Service: Gastroenterology;  Laterality: N/A;   FOOT CAPSULE RELEASE W/ PERCUTANEOUS HEEL CORD LENGTHENING, TIBIAL TENDON TRANSFER     age 57 ,and 2010-both legs   JOINT REPLACEMENT Right    both knees partial replacements dates unknown   knee replacment     x 5 per pt. last one- left  knee 2014. has had 2 on R 3 on L   TYMPANOPLASTY WITH GRAFT Right 01/08/2018   Procedure: TYMPANOPLASTY WITH POSSIBLE OSSICULAR CHAIN RECONSTRUCTION;  Surgeon: Von Grumbling, MD;  Location: [facility];  Service: ENT;  Laterality: Right;   Patient Active Problem List   Diagnosis Date Noted   Urge urinary incontinence 04/02/2023   Incontinence of feces 04/02/2023   Vaginal atrophy 04/02/2023   Asthma, persistent controlled 02/06/2023   Continuous leakage of urine 01/04/2023   De Quervain's tenosynovitis, right 10/08/2022   Hot flashes due to menopause 07/17/2022   Convulsions (HCC) 07/09/2022   Obesity due to excess calories 09/22/2020   Insomnia due to medical condition 05/11/2020   Hamstring tendonitis 01/29/2020   MDD (major depressive disorder), recurrent, in full remission (HCC) 08/12/2019   Seizure-like activity (HCC) 07/08/2019   Mild episode of recurrent major depressive disorder (HCC) 12/29/2018   Insomnia due to mental condition 12/29/2018   Disorder of vein 03/04/2018   Lumbar sprain 03/04/2018   Muscle weakness 03/04/2018   Neck sprain 03/04/2018   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   Postmenopausal bleeding 02/18/2018   Impingement syndrome of shoulder region 02/04/2018   Chest pain with moderate risk for cardiac etiology 05/19/2017   GAD (generalized anxiety disorder) 04/15/2017   Chronic daily headache 04/15/2017   Panic attack 04/15/2017  Nocturnal enuresis 04/15/2017   Mild cognitive impairment 04/15/2017   Loss of memory 01/14/2017   Pain medication agreement signed 01/08/2017   Headache disorder 12/04/2016   Chronic, continuous use of opioids 07/01/2015   Vertigo 07/01/2015   Chronic midline low back pain without sciatica 03/21/2015   Type 2 diabetes mellitus with other specified complication (HCC) 09/05/2014   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   Hip pain, chronic 04/28/2013   Essential hypertension 01/10/2013   Irritable colon 01/10/2013    Encounter for long-term (current) use of other medications 12/04/2012   Chronic GERD 08/12/2012   Diarrhea 08/12/2012   Primary localized osteoarthrosis, lower leg 05/07/2012   Difficulty walking 02/06/2012    PCP: Dr. Domingo Friend   REFERRING PROVIDER: Dr. Arlean Bellow   REFERRING DIAG: Cervicalgia   THERAPY DIAG:  Cervicalgia  Pain in left hip  Rationale for Evaluation and Treatment: Rehabilitation  ONSET DATE: 2019 it started   SUBJECTIVE:                                                                                                                                                                                                         SUBJECTIVE STATEMENT: Pt is still awaiting referral for right anterior knee pain. She reached out to doctor again for the referral and they said she might send it. Her neck pain continues to remain low and she was able to perform lifting exercises on machines at the gym.  Hand dominance: Right  PERTINENT HISTORY:  Neck pain started around 5 years ago and it has occurred on her right side that is affected by cerebral palsy. She started having cervical radicular pain after receiving the most recent injection on February 12th. The pain travels down from her neck into the palm of her hand. She describes feeling numbness and pain. She went to [facility] for neuro   PAIN:  Are you having pain? Yes: NPRS scale: 8/10  Pain location: Right patella or right anterior knee pain   Pain description: Sharp and shooting pain  Aggravating factors: Rainy weather    Relieving factors: Heating pad   PRECAUTIONS: None  RED FLAGS: None     WEIGHT BEARING RESTRICTIONS: No  FALLS:  Has patient fallen in last 6 months? No  LIVING ENVIRONMENT: Lives with: lives with their partner Lives in: House/apartment Stairs: No Has following equipment at home: Otho Blitz - 2 wheeled, Wheelchair (manual), shower chair, and Grab  bars  OCCUPATION: None   PLOF: Independent  PATIENT GOALS: Decrease pain  NEXT MD VISIT: May 14th   OBJECTIVE:  Note: Objective measures were completed at Evaluation unless otherwise noted.  VITALS BP 130/77 HR 90 SpO22   DIAGNOSTIC FINDINGS:  None listed for cervical spine (Per pt's [facility] report - with left sided narrowing of foramin nerve compression at C6-C7  PATIENT SURVEYS:  NDI 38/50 (76%), 16/50 (32%)  COGNITION: Overall cognitive status: Within functional limits for tasks assessed  SENSATION: Decreased sensation in RUE   POSTURE: rounded shoulders  PALPATION: Right upper trap tender to palpate    CERVICAL ROM:   Active ROM A/PROM (deg) eval A/PROM (deg)  09/25/23   Flexion 45 45  Extension 25/45 20  Right lateral flexion 20 20*  Left lateral flexion 20 40  Right rotation 55* 60  Left rotation 55 60    (Blank rows = not tested)           UPPER EXTREMITY ROM:  AROM Right eval Left eval  Shoulder flexion    Shoulder extension    Shoulder abduction    Shoulder adduction    Shoulder extension    Shoulder internal rotation    Shoulder external rotation    Middle trapezius    Lower trapezius    Elbow flexion    Elbow extension    Wrist flexion    Wrist extension    Wrist ulnar deviation    Wrist radial deviation    Wrist pronation    Wrist supination    Grip strength     (Blank rows = not tested)  Combined Shoulder ER R/L NT  Combined Shoulder IR R/L NT   UPPER EXTREMITY MMT:  MMT Right eval Left eval  Shoulder flexion 4 4  Shoulder extension    Shoulder abduction 4 4  Shoulder adduction    Shoulder extension    Shoulder internal rotation 4 4  Shoulder external rotation 4- 4-  Elbow flexion 4 4  Elbow extension    Wrist flexion 4 4  Wrist extension 4 4  Wrist ulnar deviation 4 4  Wrist radial deviation 4 4  Wrist pronation 4 4  Wrist supination 4 4   (Blank rows = not tested) Grip Strength                                      Fair              Fair    CERVICAL SPECIAL TESTS:  Upper limb tension test (ULTT): NT and Distraction test: NT  Spurling's : Positive on Left    TREATMENT DATE: Patient has a latex allergy    09/25/23 :  THEREX  NDI (16/50) 32%  Active ROM A/PROM (deg) eval A/PROM (deg)  09/25/23   Flexion 45 45  Extension 25/45 20  Right lateral flexion 20 20*  Left lateral flexion 20 40  Right rotation 55* 60  Left rotation 55 60    (Blank rows = not tested)  MMT Right eval Left eval Right  09/25/23  Left 09/25/23   Shoulder flexion 4 4 4+ 4+  Shoulder extension      Shoulder abduction 4 4 4+ 4+  Shoulder adduction      Shoulder extension      Shoulder internal rotation 4 4 4 4   Shoulder external rotation 4- 4- 4 4  Mid Trap       Rhomboids  Lower Trap       Elbow flexion 4 4    Elbow extension      Wrist flexion 4 4    Wrist extension 4 4    Wrist ulnar deviation 4 4    Wrist radial deviation 4 4    Wrist pronation 4 4    Wrist supination 4 4     (Blank rows = not tested) Grip Strength                                     Fair              Fair    Reviewed home exercise program: D2 PNF Flexion and Extension 2 x 10  Shoulder Horizontal Abduction AROM 2 x 10     09/09/23: Bilateral D2 PNF Flexion and Extension 3 x 10   -min VC to increase shoulder flexion beyond 90 degrees   Supine Serratus Punches with #5 DB  3 x 10  Supine Chest Press with #5 DB 2 x 10   Right Knee Palpation: Lateral joint line and Gerdy's Tubercle TTP  Varus and valgus test: Negative on right knee   Side Lying Hip Abduction in 30 deg flexion on RLE 1 x 10  -Pt reports no increase in her lateral knee pain   09/05/23: THEREX  OMEGA Seated Rows #5 1 x 10  OMEGA Seated Rows #10 1 x 10  -min VC to decrease posterior trunk lean  OMEGA Seated Rows #15 1 x 10  OMEGA Lat Pull Downs #15 2 x 10  -mod VC to contact top of chest and to perfomr Seated T's AROM 1 x 10 Seated T's #1 DB 1  x 10  Seated T's #3 DB 1 x 10   Seated Y's AROM  3 x 10    08/15/23  THEREX   Upper Trap Stretch with overpressure 4 x 60 sec  -mod VC to sequence exercise with increase left side rotation and side bend to stretch right upper trap  Seated Rows with Blue Theraband 2 x 10  Spurling's: Negative on RUE   MANUAL: Performed while using moist heat over upper traps   Right upper trap trigger point release and massage   Mid Trap and Rhomboid trigger point release and massage    [facility]  Use of theracane for trigger point release   -How to apply pressure and not to release until tension dissipates   trap is incredibly pain to touch for patient      PATIENT EDUCATION:  Education details: Form and technique for correct performance of exercise.  Person educated: Patient Education method: Explanation, Verbal cues, and Handouts Education comprehension: verbalized understanding, returned demonstration, verbal cues required, and tactile cues required  HOME EXERCISE PROGRAM: Access Code: [email or web address] URL: [email or web address] Date: 09/25/2023 Prepared by: Marge Shed  Exercises - Seated Upper Trapezius Stretch  - 1 x daily - 7 x weekly - 3 reps - 30-60 sec  hold - Shoulder PNF D2 Flexion  - 3-4 x weekly - 3 sets - 10 reps - Seated Shoulder Horizontal Abduction - Thumbs Up  - 3-4 x weekly - 3 sets - 10 reps - Seated Row Cable Machine  - 3-4 x weekly - 3 sets - 10 reps - Lat Pull Down  - 3-4 x weekly - 3 sets - 10 reps   ASSESSMENT:  CLINICAL  IMPRESSION: Pt has met most of her rehab goals with improvement in cervical function and shoulder strength. Despite these improvements, pt still has limited cervical ROM with likely right sided cervical radiculopathy with positive Spurling's and pain that radiates down RUE. Reviewed home exercise program for patient to continue to perform exercises to maintain functional gains. Pt to report back for eval of right knee  once referral is received. She is now ready for discharge.   OBJECTIVE IMPAIRMENTS: decreased ROM, decreased strength, impaired sensation, impaired UE functional use, and pain.   ACTIVITY LIMITATIONS: carrying, lifting, bending, bathing, dressing, reach over head, and hygiene/grooming  PARTICIPATION LIMITATIONS: meal prep, cleaning, laundry, shopping, and community activity  PERSONAL FACTORS: Age, Education, Sex, Social background, Time since onset of injury/illness/exacerbation, Transportation, and 3+ comorbidities: Cerebral Palsy, HTN, T2DM are also affecting patient's functional outcome.   REHAB POTENTIAL: Fair chronicity of neck pain and high number of co-morbidities   CLINICAL DECISION MAKING: Stable/uncomplicated  EVALUATION COMPLEXITY: Low   GOALS: Goals reviewed with patient? No  SHORT TERM GOALS: Target date: 08/06/2023  Patient will demonstrate undestanding of home exercise plan by performing exercises correctly with evidence of good carry over with min to no verbal or tactile cues .   Baseline: NT 4/425: Performing exercises independently  Goal status: ACHIEVED       LONG TERM GOALS: Target date: 10/02/2023   Patient will show a statistically significant improvement in her neck function as evidence by >=7.5 pt increase in her neck disability index score to better be able to move neck to improve visual field while driving to avoid accidents. Linder Revere et al, 2009) Baseline: 38/50 (76%), 16/50 (32%) Goal status: ACHIEVED    2.  Patient will demonstrate improved cervical AROM that are within 10% of normative values for improved cervical mobility and function for improved pain response to activity.  Baseline: Cervical Ext 25, Cervical Rot R/L 20* 09/25/23: Cervical Rot R/L 60/60, Cervical SB R/L 25*/40, Cervical Ext 20* Goal status: NOT MET    3. Patient will demonstrate an improvement in her shoulder periscapular strength as evidenced by an increase >=1/3 grade MMT (4- to 4) for  improved cervical spine stability and function to better be able to move neck to increase visual field by turning head to avoid accidents.            Baseline:             MMT Right eval Left eval Right  09/25/23  Left 09/25/23   Shoulder flexion 4 4 4+ 4+  Shoulder extension      Shoulder abduction 4 4 4+ 4+  Shoulder adduction      Shoulder extension      Shoulder internal rotation 4 4 4 4   Shoulder external rotation 4- 4- 4 4  Mid Trap       Rhomboids       Lower Trap       Elbow flexion 4 4    Elbow extension      Wrist flexion 4 4    Wrist extension 4 4    Wrist ulnar deviation 4 4    Wrist radial deviation 4 4    Wrist pronation 4 4    Wrist supination 4 4     (Blank rows = not tested) Grip Strength  Fair              Fair            Goal status: ACHIEVED       PLAN:  PT FREQUENCY: 1-2x/week  PT DURATION: 10 weeks  PLANNED INTERVENTIONS: 97164- PT Re-evaluation, 97110-Therapeutic exercises, 97530- Therapeutic activity, [id number]- Neuromuscular re-education, [phone]- Self Care, 08657- Manual therapy, [phone]- Gait training, [phone]- Orthotic Fit/training, [phone]- Prosthetic training, [phone]- Aquatic Therapy, [phone]- Electrical stimulation (unattended), [phone]- Electrical stimulation (manual), [id number]- Traction (mechanical), Patient/Family education, Dry Needling, Joint mobilization, Joint manipulation, Spinal manipulation, Spinal mobilization, Cryotherapy, and Moist heat  PLAN FOR NEXT SESSION: Discharge from PT, awaiting for referral for right knee    Marge Shed PT, DPT  [facility] [address], [place], [address] Phone: [phone]   Fax:  [phone]

## 2023-09-28 ENCOUNTER — Other Ambulatory Visit: Payer: Self-pay | Admitting: Psychiatry

## 2023-09-28 DIAGNOSIS — F411 Generalized anxiety disorder: Secondary | ICD-10-CM

## 2023-09-30 ENCOUNTER — Telehealth (INDEPENDENT_AMBULATORY_CARE_PROVIDER_SITE_OTHER): Admitting: Psychiatry

## 2023-09-30 ENCOUNTER — Encounter: Payer: Self-pay | Admitting: Psychiatry

## 2023-09-30 ENCOUNTER — Other Ambulatory Visit

## 2023-09-30 DIAGNOSIS — G4701 Insomnia due to medical condition: Secondary | ICD-10-CM

## 2023-09-30 DIAGNOSIS — F3342 Major depressive disorder, recurrent, in full remission: Secondary | ICD-10-CM | POA: Diagnosis not present

## 2023-09-30 DIAGNOSIS — F411 Generalized anxiety disorder: Secondary | ICD-10-CM | POA: Diagnosis not present

## 2023-09-30 DIAGNOSIS — R52 Pain, unspecified: Secondary | ICD-10-CM

## 2023-09-30 MED ORDER — MIRTAZAPINE 15 MG PO TABS
22.5000 mg | ORAL_TABLET | Freq: Every day | ORAL | 1 refills | Status: DC
Start: 2023-09-30 — End: 2024-03-10

## 2023-09-30 NOTE — Progress Notes (Signed)
 Virtual Visit via Video Note  I connected with Traci Davis on 09/30/23 at  1:20 PM EDT by a video enabled telemedicine application and verified that I am speaking with the correct person using two identifiers.  Location Provider Location : [facility] Patient Location : Home  Participants: Patient , Provider   I discussed the limitations of evaluation and management by telemedicine and the availability of in person appointments. The patient expressed understanding and agreed to proceed.   I discussed the assessment and treatment plan with the patient. The patient was provided an opportunity to ask questions and all were answered. The patient agreed with the plan and demonstrated an understanding of the instructions.   The patient was advised to call back or seek an in-person evaluation if the symptoms worsen or if the condition fails to improve as anticipated.   BH MD OP Progress Note  09/30/2023 1:39 PM RYA RAUSCH  MRN:  [id number]  Chief Complaint:  Chief Complaint  Patient presents with   Follow-up   Depression   Anxiety   Medication Refill   Discussed the use of AI scribe software for clinical note transcription with the patient, who gave verbal consent to proceed.  History of Present Illness Traci Davis is a 53 year old Caucasian female, lives in [place], has a history of MDD, GAD, insomnia, cerebral palsy, diabetes mellitus on SSD was evaluated by telemedicine today.  During her last visit in March, she was not too depressed or anxious, and her sleep had improved, although it took some time to fall asleep.  She continues to reports mood symptoms and sleep as stable.  She continues to take Prozac , Lamictal , Mirtazapine , and BuSpar , and reports that everything is working fine.  She denies any side effects to medications.  Since March, she has been experiencing significant pancreas problems, leading to multiple hospital visits. She has been to the hospital at least twice,  with the most recent visit on May 21st at [facility] emergency department, where she was diagnosed with chronic pancreatitis. Her A1c has increased to 9.6, and her blood sugar levels have been high. She reports experiencing left upper quadrant pain associated with her chronic pancreatitis.  She has been experiencing hip pain and leg cramps, which she attributes to a change in her muscle relaxant medication. She was switched to a buprenorphine patch and methocarbamol  (Robaxan) for muscle relaxation, but her muscle spasms have worsened since the change.  Denies thoughts of self-harm or harm to others.   Continues to have good support system from her daughter who visits on a regular basis during the day.  She denies any other concerns today.    Visit Diagnosis:    ICD-10-CM   1. MDD (major depressive disorder), recurrent, in full remission (HCC)  F33.42     2. GAD (generalized anxiety disorder)  F41.1     3. Insomnia due to medical condition  G47.01 mirtazapine  (REMERON ) 15 MG tablet   pain, mood      Past Psychiatric History: I have reviewed past psychiatric history from progress note on 09/12/2017.  Past trials of Xanax, Klonopin, Cymbalta , Rozerem , Ambien , trazodone , doxepin .  Past Medical History:  Past Medical History:  Diagnosis Date   Acute pancreatitis 09/04/2014   Anxiety    Arthritis    Asthma    Cerebral palsy (HCC)    Complication of anesthesia    panic attacks before surgery   COPD (chronic obstructive pulmonary disease) (HCC)    Depression  Diabetes mellitus without complication (HCC)    GERD (gastroesophageal reflux disease)    Hypertension    Noninfectious gastroenteritis and colitis 01/10/2013    Past Surgical History:  Procedure Laterality Date   CESAREAN SECTION  1995   CHOLECYSTECTOMY     DILATATION & CURETTAGE/HYSTEROSCOPY WITH MYOSURE N/A 02/20/2018   Procedure: DILATATION & CURETTAGE/HYSTEROSCOPY WITH MYOSURE ENDOMETRIAL POLYPECTOMY;  Surgeon: Kris Pester, MD;  Location: [facility] ORS;  Service: Gynecology;  Laterality: N/A;   ESOPHAGOGASTRODUODENOSCOPY (EGD) WITH PROPOFOL  N/A 01/01/2019   Procedure: ESOPHAGOGASTRODUODENOSCOPY (EGD) WITH PROPOFOL ;  Surgeon: Selena Daily, MD;  Location: [facility] ENDOSCOPY;  Service: Gastroenterology;  Laterality: N/A;   FOOT CAPSULE RELEASE W/ PERCUTANEOUS HEEL CORD LENGTHENING, TIBIAL TENDON TRANSFER     age 53 ,and 2010-both legs   JOINT REPLACEMENT Right    both knees partial replacements dates unknown   knee replacment     x 5 per pt. last one- left knee 2014. has had 2 on R 3 on L   TYMPANOPLASTY WITH GRAFT Right 01/08/2018   Procedure: TYMPANOPLASTY WITH POSSIBLE OSSICULAR CHAIN RECONSTRUCTION;  Surgeon: Von Grumbling, MD;  Location: [facility] ORS;  Service: ENT;  Laterality: Right;    Family Psychiatric History: I have reviewed family psychiatric history from progress note on 09/12/2017.  Family History:  Family History  Problem Relation Age of Onset   CAD Father    Heart attack Brother    Sexual abuse Paternal Grandfather    Breast cancer Neg Hx     Social History: I have reviewed social history from progress note on 09/12/2017. Social History   Socioeconomic History   Marital status: Single    Spouse name: Not on file   Number of children: 1   Years of education: Not on file   Highest education level: Associate degree: occupational, Scientist, product/process development, or vocational program  Occupational History   Not on file  Tobacco Use   Smoking status: Never   Smokeless tobacco: Never  Vaping Use   Vaping status: Never Used  Substance and Sexual Activity   Alcohol use: No   Drug use: No   Sexual activity: Yes    Partners: Male    Birth control/protection: Condom  Other Topics Concern   Not on file  Social History Narrative   Not on file   Social Drivers of Health   Financial Resource Strain: Low Risk  (05/23/2023)   Received from [facility]   Overall Financial Resource Strain (CARDIA)     Difficulty of Paying Living Expenses: Not very hard  Recent Concern: Financial Resource Strain - Medium Risk (05/09/2023)   Overall Financial Resource Strain (CARDIA)    Difficulty of Paying Living Expenses: Somewhat hard  Food Insecurity: No Food Insecurity (05/23/2023)   Received from [facility]   Hunger Vital Sign    Worried About Running Out of Food in the Last Year: Never true    Ran Out of Food in the Last Year: Never true  Recent Concern: Food Insecurity - Food Insecurity Present (05/09/2023)   Hunger Vital Sign    Worried About Running Out of Food in the Last Year: Sometimes true    Ran Out of Food in the Last Year: Sometimes true  Transportation Needs: No Transportation Needs (05/23/2023)   Received from [facility]   PRAPARE - Transportation    Lack of Transportation (Medical): No    Lack of Transportation (Non-Medical): No  Physical Activity: Insufficiently Active (05/09/2023)   Exercise Vital  Sign    Days of Exercise per Week: 3 days    Minutes of Exercise per Session: 30 min  Stress: No Stress Concern Present (05/23/2023)   Received from [facility] - Occupational Stress Questionnaire    Feeling of Stress : Only a little  Social Connections: Socially Integrated (05/23/2023)   Received from [facility]   Social Connection and Isolation Panel [NHANES]    Frequency of Communication with Friends and Family: More than three times a week    Frequency of Social Gatherings with Friends and Family: More than three times a week    Attends Religious Services: More than 4 times per year    Active Member of [employer] or Organizations: Yes    Attends Banker Meetings: More than 4 times per year    Marital Status: Living with partner  Recent Concern: Social Connections - Moderately Isolated (05/09/2023)   Social Connection and Isolation Panel [NHANES]    Frequency of Communication with Friends and Family: Three times a week     Frequency of Social Gatherings with Friends and Family: Twice a week    Attends Religious Services: More than 4 times per year    Active Member of [employer] or Organizations: No    Attends Engineer, structural: Not on file    Marital Status: Never married    Allergies:  Allergies  Allergen Reactions   Meloxicam Other (See Comments)    Damage kidney   Morphine And Codeine Other (See Comments)    hallucinations   Vantin [Cefpodoxime] Nausea And Vomiting   Latex Itching   Baclofen Other (See Comments)    "makes cerebral palsy do adverse reaction on me", tightens muscles    Ciprofloxacin  Itching and Nausea And Vomiting   Prednisone Other (See Comments)    Elevated blood glucose   Tape Rash    skin tears.  Paper tape is ok   Tramadol Nausea Only    Metabolic Disorder Labs: Lab Results  Component Value Date   HGBA1C 9.6 (H) 09/04/2023   MPG 229 09/04/2023   No results found for: "PROLACTIN" Lab Results  Component Value Date   CHOL 226 (H) 09/04/2023   TRIG 198 (H) 09/04/2023   HDL 60 09/04/2023   CHOLHDL 3.8 09/04/2023   LDLCALC 132 (H) 09/04/2023   Lab Results  Component Value Date   TSH 1.87 09/04/2023   TSH 2.198 07/10/2016    Therapeutic Level Labs: No results found for: "LITHIUM" No results found for: "VALPROATE" No results found for: "CBMZ"  Current Medications: Current Outpatient Medications  Medication Sig Dispense Refill   acetaminophen  (TYLENOL ) 325 MG tablet Take 2 tablets (650 mg total) by mouth every 6 (six) hours as needed. 60 tablet 0   albuterol  (PROVENTIL  HFA;VENTOLIN  HFA) 108 (90 BASE) MCG/ACT inhaler Inhale 2 puffs into the lungs 4 (four) times daily as needed. For shortness of breath and/or wheezing     amLODipine  (NORVASC ) 5 MG tablet Take 1 tablet (5 mg total) by mouth daily. 90 tablet 3   atorvastatin  (LIPITOR) 20 MG tablet Take 1 tablet (20 mg total) by mouth daily. 90 tablet 3   azelastine (ASTELIN) 0.1 % nasal spray Place into  both nostrils.     Blood Glucose Monitoring Suppl (GLUCOCOM BLOOD GLUCOSE MONITOR) DEVI Test daily before all meals/snacks and once before bedtime.     budesonide-formoterol (SYMBICORT) 80-4.5 MCG/ACT inhaler Inhale 2 puffs up to 4 times  daily as needed for symptoms of wheezing or shortness of breath.     buprenorphine (BUTRANS) 15 MCG/HR 1 patch once a week.     busPIRone  (BUSPAR ) 10 MG tablet Take 1 tablet (10 mg total) by mouth 3 (three) times daily. 270 tablet 1   Continuous Glucose Sensor (DEXCOM G7 SENSOR) MISC Apply Dexcom G7 sensor every 10 days. Type 2 Diabetes uncontrolled (E11.65)     CREON  36000-114000 units CPEP capsule Take 3 capsules (108,000 Units total) by mouth 3 (three) times daily with meals. 300 capsule 5   dicyclomine  (BENTYL ) 10 MG capsule Take 2 capsules in morning with meal and 1 capsule in evening with meal. 90 capsule 5   donepezil (ARICEPT) 10 MG tablet Take 10 mg by mouth at bedtime.     estradiol (ESTRACE) 0.1 MG/GM vaginal cream Place 1 Applicatorful vaginally 3 (three) times a week.     FLUoxetine  (PROZAC ) 20 MG capsule Take 1 capsule (20 mg total) by mouth daily. Take along with 40 mg daily - total of 60 mg daily 90 capsule 1   FLUoxetine  (PROZAC ) 40 MG capsule TAKE 1 CAPSULE BY MOUTH ONCE DAILY . TAKE ALONG WITH 20MG  DAILY AS DIRECTED 90 capsule 1   furosemide  (LASIX ) 40 MG tablet Take 1 tablet (40 mg total) by mouth daily as needed for fluid. 30 tablet 5   gabapentin (NEURONTIN) 300 MG capsule Take 600 mg by mouth 3 (three) times daily.     glucose blood test strip USE TO TEST BLOOD SUGAR TWO TIMES A DAY     GVOKE HYPOPEN  2-PACK 1 MG/0.2ML SOAJ Inject 1 mg into the skin as needed (hypoglycemia). 1 mL 0   incobotulinumtoxinA (XEOMIN) 100 units SOLR injection Inject 100 Units into the muscle every 3 (three) months.      Incontinence Supply Disposable (PREVAIL BLADDER CONTROL PAD) MISC Large incontinence pads to go into underwear     insulin  glargine (LANTUS ) 100  UNIT/ML Solostar Pen Inject 34 Units into the skin daily. (Patient taking differently: Inject 36 Units into the skin daily.) 30 mL 0   Insulin  Pen Needle (GLOBAL EASE INJECT PEN NEEDLES) 31G X 5 MM MISC Use pen needle to inject insulin  daily. 100 each 0   lamoTRIgine  (LAMICTAL ) 25 MG tablet Take 1 tablet (25 mg total) by mouth daily. 90 tablet 1   Lancets (FREESTYLE) lancets Test daily before all meals/snacks     lidocaine  (LIDODERM ) 5 % Place 2 patches onto the skin daily.     linaclotide (LINZESS) 72 MCG capsule Take 72 mcg by mouth daily before breakfast. (Patient not taking: Reported on 09/18/2023)     lisinopril  (ZESTRIL ) 20 MG tablet Take 1 tablet (20 mg total) by mouth daily. 90 tablet 3   metFORMIN  (GLUCOPHAGE -XR) 500 MG 24 hr tablet Take 2 tablets (1,000 mg total) by mouth 2 (two) times daily. 360 tablet 3   methocarbamol  (ROBAXIN ) 500 MG tablet Take 1 tablet (500 mg total) by mouth 3 (three) times daily as needed for muscle spasms. 90 tablet 5   mirabegron  ER (MYRBETRIQ ) 50 MG TB24 tablet Take 1 tablet (50 mg total) by mouth daily. 30 tablet 3   mirtazapine  (REMERON ) 15 MG tablet Take 1.5 tablets (22.5 mg total) by mouth at bedtime. 135 tablet 1   montelukast  (SINGULAIR ) 10 MG tablet Take 1 tablet (10 mg total) by mouth at bedtime. 90 tablet 3   naloxone (NARCAN) nasal spray 4 mg/0.1 mL      nystatin (MYCOSTATIN/NYSTOP) powder  Apply topically.     omeprazole  (PRILOSEC) 40 MG capsule Take 1 capsule (40 mg total) by mouth in the morning and at bedtime. 90 capsule 3   ondansetron  (ZOFRAN -ODT) 4 MG disintegrating tablet Take 1 tablet (4 mg total) by mouth every 8 (eight) hours as needed for nausea or vomiting. 30 tablet 2   oxyCODONE -acetaminophen  (PERCOCET) 10-325 MG tablet Take 1 tablet by mouth 3 (three) times daily as needed.     SUMAtriptan (IMITREX) 100 MG tablet Take 100 mg by mouth as directed. As needed for migraines     triamcinolone cream (KENALOG) 0.1 % Apply 1 Application  topically 3 (three) times daily as needed.     XTAMPZA  ER 13.5 MG C12A Take 1 capsule by mouth 2 (two) times daily.     No current facility-administered medications for this visit.     Musculoskeletal: Strength & Muscle Tone: UTA Gait & Station: Seated Patient leans: N/A  Psychiatric Specialty Exam: Review of Systems  Psychiatric/Behavioral: Negative.      Last menstrual period 08/20/2014.There is no height or weight on file to calculate BMI.  General Appearance: Casual  Eye Contact:  Fair  Speech:  Clear and Coherent  Volume:  Normal  Mood:  Euthymic  Affect:  Congruent  Thought Process:  Goal Directed and Descriptions of Associations: Intact  Orientation:  Full (Time, Place, and Person)  Thought Content: Logical   Suicidal Thoughts:  No  Homicidal Thoughts:  No  Memory:  Immediate;   Fair Recent;   Fair Remote;   Fair  Judgement:  Fair  Insight:  Fair  Psychomotor Activity:  Normal  Concentration:  Concentration: Fair and Attention Span: Fair  Recall:  [employer] of Knowledge: Fair  Language: Fair  Akathisia:  No  Handed:  Right  AIMS (if indicated): not done  Assets:  Communication Skills Desire for Improvement Housing Social Support  ADL's:  Intact  Cognition: WNL  Sleep:  Fair   Screenings: Geneticist, molecular Office Visit from 04/05/2022 in [facility] Office Visit from 10/16/2021 in [facility]  AIMS Total Score 0 0      GAD-7    Flowsheet Row Video Visit from 12/06/2022 in [facility] Office Visit from 04/05/2022 in [facility] Video Visit from 06/06/2021 in [facility]  Total GAD-7 Score 5 8 1       PHQ2-9    Flowsheet Row Office Visit from 02/27/2023 in [facility] Video Visit from 12/06/2022 in [facility] Office Visit from 04/05/2022 in [facility] Video Visit from 11/13/2021 in [facility] Office Visit from 10/16/2021 in [facility]  PHQ-2 Total Score 0 0 2 0 1  PHQ-9 Total Score -- -- 9 -- 5      Flowsheet Row Video Visit from 09/30/2023 in [facility] Video Visit from 07/01/2023 in [facility] Office Visit from 04/04/2023 in [facility]  C-SSRS RISK CATEGORY No Risk No Risk No Risk        Assessment and Plan: Traci Davis is a 53 year old Caucasian female who has a history of depression, anxiety, chronic pain was evaluated by telemedicine today.  Discussed assessment and plan as noted below.  Major depression in remission Currently reports overall mood symptoms are stable on the current medication regimen. Continue Prozac  60 mg daily Continue Lamotrigine  25 mg daily Continue Mirtazapine  22.5 mg daily Continue BuSpar  10 mg 3 times a day.  Generalized anxiety disorder-stable Currently denies any significant anxiety symptoms and reports current medications as beneficial. Continue Prozac  60 mg daily Continue BuSpar  10 mg 3 times a day  Insomnia-stable Currently reports sleep is good on the mirtazapine . Continue Mirtazapine  22.5 mg daily at bedtime Continue sleep hygiene techniques. Will continue to need sufficient pain management  Follow-up Follow-up in clinic in 3 months or sooner if needed.   Collaboration of Care: Collaboration of Care: Other I have reviewed notes from most recent emergency department visit at [facility] emergency department dated 09/18/2023-patient with left upper quadrant abdominal pain likely chronic pancreatitis/gastroparesis.  Patient/Guardian was advised Release of Information must be  obtained prior to any record release in order to collaborate their care with an outside provider. Patient/Guardian was advised if they have not already done so to contact the registration department to sign all necessary forms in order for us  to release information regarding their care.   Consent: Patient/Guardian gives verbal consent for treatment and assignment of benefits for services provided during this visit. Patient/Guardian expressed understanding and agreed to proceed.   This note was generated in part or whole with voice recognition software. Voice recognition is usually quite accurate but there are transcription errors that can and very often do occur. I apologize for any typographical errors that were not detected and corrected.    Iam Lipson, MD 09/30/2023, 1:39 PM

## 2023-10-01 DIAGNOSIS — E119 Type 2 diabetes mellitus without complications: Secondary | ICD-10-CM | POA: Diagnosis not present

## 2023-10-01 DIAGNOSIS — E1165 Type 2 diabetes mellitus with hyperglycemia: Secondary | ICD-10-CM | POA: Diagnosis not present

## 2023-10-01 DIAGNOSIS — Z794 Long term (current) use of insulin: Secondary | ICD-10-CM | POA: Diagnosis not present

## 2023-10-02 ENCOUNTER — Other Ambulatory Visit

## 2023-10-02 ENCOUNTER — Ambulatory Visit: Admitting: Physical Therapy

## 2023-10-03 ENCOUNTER — Ambulatory Visit: Admitting: Physical Therapy

## 2023-10-07 ENCOUNTER — Encounter: Attending: Family Medicine | Admitting: Dietician

## 2023-10-07 ENCOUNTER — Encounter: Payer: Self-pay | Admitting: Family Medicine

## 2023-10-07 ENCOUNTER — Other Ambulatory Visit: Admitting: Pharmacist

## 2023-10-07 ENCOUNTER — Ambulatory Visit (INDEPENDENT_AMBULATORY_CARE_PROVIDER_SITE_OTHER): Admitting: Family Medicine

## 2023-10-07 VITALS — BP 130/80 | HR 90 | Ht 62.0 in | Wt 207.4 lb

## 2023-10-07 DIAGNOSIS — E1169 Type 2 diabetes mellitus with other specified complication: Secondary | ICD-10-CM | POA: Diagnosis not present

## 2023-10-07 DIAGNOSIS — Z794 Long term (current) use of insulin: Secondary | ICD-10-CM

## 2023-10-07 DIAGNOSIS — G801 Spastic diplegic cerebral palsy: Secondary | ICD-10-CM | POA: Diagnosis not present

## 2023-10-07 DIAGNOSIS — Z713 Dietary counseling and surveillance: Secondary | ICD-10-CM | POA: Insufficient documentation

## 2023-10-07 NOTE — Progress Notes (Unsigned)
 Subjective:    Patient ID: Traci Davis, female    DOB: 10-27-1970, 53 y.o.   MRN: [id number]  Traci Davis is a 53 y.o. female presenting on 10/07/2023 for Mobility Assessment (Cerebral palsy)   HPI  Discussed the use of AI scribe software for clinical note transcription with the patient, who gave verbal consent to proceed.  History of Present Illness   Traci Davis is a 53 year old female with diabetes and cerebral palsy who presents for a power wheelchair renewal and management of uncontrolled blood sugar levels.  She is seeking renewal for her power wheelchair, a Jazzy-6 model, which she has been using for five years. The seat is caving in on one side, necessitating a replacement. She needs a feature to elevate the height of the wheelchair to assist with reaching items, as she currently struggles with this due to her height limitations.  She has difficulty managing her blood sugar levels despite dietary efforts, including a low-fat, low-carb, and no-sugar diet. Her blood sugar levels have been fluctuating, with recent readings reaching as high as 384 mg/dL, despite efforts to maintain them below 300 mg/dL. She is currently taking metformin  1000 mg twice daily and Lantus  36 units daily, with a recent A1c of 9.6%.  She has a history of pancreatitis, which complicates her diabetes management. She reports moderate pancreatic insufficiency with a level of 124, which she was told is too low. She has been hospitalized for pancreatic issues in the past and is cautious with her diet to avoid exacerbating her condition. She cannot take certain diabetes medications like Jardiance  due to adverse effects, such as yeast infections, and is unable to use weekly injectable medications due to her pancreatic condition.    Power Wheelchair Mobility Assessment  - Patient has difficulty with reaching different areas within the house such as kitchen and bathroom as she is limited in mobility, a power  wheelchair would help access different areas of house more frequently and perform ADLs such as meal prep and cooking in kitchen and toileting  - Unable to use cane or walker regularly due to cerebral palsy muscle weakness in left lower extremity and has fallen while using both cane and walker due to pain causing weakness and lack of support.  - Unable to use manual wheelchair due to reduced upper and lower body strength and limited range of motion  - Unable to use a scooter (power device) due to limited ability to transfer on and off scooter, due to muscle weakness and reduced range of motion in hips      02/27/2023    4:52 PM 12/06/2022    3:34 PM 04/05/2022    4:17 PM  Depression screen PHQ 2/9  Decreased Interest 0    Down, Depressed, Hopeless 0    PHQ - 2 Score 0    Altered sleeping     Tired, decreased energy     Change in appetite     Feeling bad or failure about yourself      Trouble concentrating     Moving slowly or fidgety/restless     Suicidal thoughts     PHQ-9 Score        Information is confidential and restricted. Go to Review Flowsheets to unlock data.       12/06/2022    3:34 PM 04/05/2022    4:18 PM 06/06/2021    4:51 PM  GAD 7 : Generalized Anxiety Score  Nervous, Anxious, on Edge  Control/stop worrying     Worry too much - different things     Trouble relaxing     Restless     Easily annoyed or irritable     Afraid - awful might happen     Total GAD 7 Score     Anxiety Difficulty        Information is confidential and restricted. Go to Review Flowsheets to unlock data.    Social History   Tobacco Use   Smoking status: Never   Smokeless tobacco: Never  Vaping Use   Vaping status: Never Used  Substance Use Topics   Alcohol use: No   Drug use: No    Review of Systems Per HPI unless specifically indicated above     Objective:     BP 130/80 (BP Location: Left Arm, Patient Position: Sitting, Cuff Size: Normal)   Pulse 90   Ht 5\' 2"  (1.575  m)   Wt 207 lb 6 oz (94.1 kg)   LMP 08/20/2014 Comment: missed cup when trying to obtain urine spec  SpO2 94%   BMI 37.93 kg/m   Wt Readings from Last 3 Encounters:  10/07/23 207 lb 6 oz (94.1 kg)  09/16/23 207 lb (93.9 kg)  08/26/23 207 lb (93.9 kg)    Physical Exam Vitals and nursing note reviewed.  Constitutional:      General: She is not in acute distress.    Appearance: Normal appearance. She is well-developed. She is obese. She is not diaphoretic.     Comments: Well-appearing, comfortable, cooperative  HENT:     Head: Normocephalic and atraumatic.  Eyes:     General:        Right eye: No discharge.        Left eye: No discharge.     Conjunctiva/sclera: Conjunctivae normal.  Cardiovascular:     Rate and Rhythm: Normal rate.  Pulmonary:     Effort: Pulmonary effort is normal.  Abdominal:     General: Bowel sounds are normal.     Palpations: There is no mass.     Tenderness: There is abdominal tenderness. There is no guarding.  Musculoskeletal:     Comments: Left sided reduced mobility with CP chronic wearing lower extremity hinge brace, has under developed muscles on left lower extremity.  Rolling walker  Skin:    General: Skin is warm and dry.     Findings: No erythema or rash.  Neurological:     Mental Status: She is alert and oriented to person, place, and time.  Psychiatric:        Mood and Affect: Mood normal.        Behavior: Behavior normal.        Thought Content: Thought content normal.     Comments: Well groomed, good eye contact, normal speech and thoughts     Upper and Lower Extremity Assessment RUE - Strength - 5 out of 5 - Pain - 1 out of 10 - Range of Motion - Mostly full range   LUE - Strength - 4 out of 5 - Pain - 3 out of 10 - Range of Motion - mostly full range   RLE - Strength 4 out of 5 - Pain - 2 out of 10 - Range of Motion - intact   LLE - Strength - 2 out of 5 - limited due to cerebral palsy weakness, has hinge brace - Pain  - 5 out of 10 - Range of Motion - limited due to  cerebral palsy weakness  Results for orders placed or performed in visit on 10/01/23  HM DIABETES EYE EXAM   Collection Time: 09/24/23 11:08 AM  Result Value Ref Range   HM Diabetic Eye Exam No Retinopathy No Retinopathy      Assessment & Plan:   Problem List Items Addressed This Visit     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 Last HbA1c 9.6%. Current regimen includes metformin  and Lantus . Jardiance  caused yeast infection, GLP-1 receptor agonists contraindicated. Pancreatic insufficiency complicates management. Considering insulin  pump. - Refer to endocrinology at [facility] for further management and potential insulin  pump evaluation. - Increase Lantus  to 40 units daily, with option to increase by 2 units weekly if blood glucose remains above 250 mg/dL. - Continue metformin  1000 mg twice daily. - Attend diabetes education class at 4:30 PM.  Moderate Pancreatic Insufficiency Moderate pancreatic insufficiency complicates diabetes management. Limited treatment options due to pancreatic issues. - Manage pancreatic insufficiency in conjunction with endocrinology referral for diabetes management.  Cerebral Palsy Left Lower extremity weakness Requires new power wheelchair due to current one being five years old and damaged. Needs height adjustment features. - Order new electric wheelchair from [employer] with request for height adjustment features.  Follow-up Follow-up scheduled with pharmacist and post-endocrinology consultation. - Schedule follow-up appointment after endocrinology consultation.       Medical Conditions: - The primary medical condition that impacts patient's mobility need is his status of CEREBRAL PALSY left lower extremity weakness due to loss of muscle mass and weak muscles. - Additional medical conditions contributing to his mobility, include type 2 diabetes with peripheral neuropathy    MRADLs impaired in the home include: - PMD is necessary for patient's mobility to get to the bathroom for routine toilet use. - PMD is necessary for patient's mobility to get to the kitchen to prepare meals - PMD is necessary for patient's mobility to get to the bedroom to dress and sleep   Cane or Walker Patient cannot use a cane / walker due to her status of Left Cerebral Palsy weakness limiting her ability to properly and safely use these devices. Her weakness as listed above   Manual Wheelchair Patient cannot use MWC due to generalized weakness limiting her ability to properly and safely use this device to propel movement. His weakness as listed above    Scooter (POV) Patient cannot use a POV due to lack of postural stability, with generalized weakness in upper and lower extremities.   Patient can safely operate the power mobility device physically. She is mentally capable to operate the device.   Patient is willing and motivated to use the power mobility device in his home to improve his quality of life.   Fax completed chart and DME order to [employer].  Orders Placed This Encounter  Procedures   Ambulatory referral to Endocrinology    Referral Priority:   Routine    Referral Type:   Consultation    Referral Reason:   Specialty Services Required    Number of Visits Requested:   1    No orders  of the defined types were placed in this encounter.   Follow up plan: Return if symptoms worsen or fail to improve.   Domingo Friend, DO [facility] 10/07/2023, 2:44 PM

## 2023-10-07 NOTE — Patient Instructions (Addendum)
 Thank you for coming to the office today.  We will fax orders to [facility]  ------------------------------------------  Referral to Endocrinology Diabetic specialist  May need new regimen with insulin  and possibly pump in future.  Since we cannot take many of the current diabetic medications.  [facility] [address] [place], [place]  [address] Phone: [phone]  Traci Davis to take increased insulin  dose up to 40 units daily now, for 1 week, and see if you need to stay there or keep adjusting up to 42, can go up by 2 units every week if still having most sugars >250  Contact if reach max dose.  Please schedule a Follow-up Appointment to: Return if symptoms worsen or fail to improve.  If you have any other questions or concerns, please feel free to call the office or send a message through MyChart. You may also schedule an earlier appointment if necessary.  Additionally, you may be receiving a survey about your experience at our office within a few days to 1 week by e-mail or mail. We value your feedback.  Domingo Friend, DO [facility], [place]

## 2023-10-07 NOTE — Patient Instructions (Signed)
 Goals Addressed             This Visit's Progress    Pharmacy Goals       The goal A1c is less than 7%. This is the best way to reduce the risk of the long term complications of diabetes, including heart disease, kidney disease, eye disease, strokes, and nerve damage. An A1c of less than 7% corresponds with fasting sugars less than 130 and 2 hour after meal sugars less than 180.   Our goal bad cholesterol, or LDL, is less than 70 . This is why it is important to continue taking your atorvastatin.  Arthur Lash, PharmD, [facility] Clinical Pharmacist [facility] [phone]

## 2023-10-07 NOTE — Progress Notes (Signed)
   10/07/2023  Patient ID: Traci Davis, female   DOB: [birth year], 54 y.o.   MRN: [id number]   Outreach to patient today by telephone as scheduled, but speak only briefly as patient reports that she is getting ready to head to office for an in person appointment with PCP.  Reports has been paying more attention to her eating habits to try to improve her blood sugar control.   Confirms planning to attend diabetes education class this afternoon at 4:30 pm.  Reschedule our appointment as requested.   Recommend to use Dexcom G7 CGM to monitor blood sugar/as feedback on dietary choices Patient to check glucose with fingerstick check when needed for symptoms and as back up to CGM. Patient to contact office if needed for readings outside of established parameters or symptoms   Follow Up Plan: Clinical Pharmacist will follow up with patient by telephone on 11/04/2023 at 1:30 PM    Arthur Lash, PharmD, [facility] [phone]

## 2023-10-07 NOTE — Progress Notes (Signed)
 Patient was seen on 10/07/23 for the first of a series of three diabetes self-management courses at the [facility] at [facility].  Patient Education Plan per assessed needs and concerns is to attend three course education program for Diabetes Self Management Education.  A1C was 9.6 on 09/04/23.  The following learning objectives were met by the patient during this class: Describe diabetes, types of diabetes and pathophysiology State some common risk factors for diabetes Defines the role of glucose and insulin  Describe the relationship between diabetes and cardiovascular and other risks State the members of the Healthcare Team States the rationale for glucose monitoring and when to test State their individual Target Range State the importance of logging glucose readings and how to interpret the readings Identifies A1C target Explain the correlation between A1c and eAG values State symptoms and treatment of high blood glucose and low blood glucose Explain proper technique for glucose testing and identify proper sharps disposal  Handouts given during class include: How to Thrive:  A Guide for Your Journey with Diabetes by the ADA Meal Plan sheet with food lists General Meal Planning Guidelines Dietary intake form A1c to eAG Conversion Chart Blood Glucose Log    Follow-Up Plan: Attend class 2 on 10/14/23

## 2023-10-08 ENCOUNTER — Encounter: Payer: Self-pay | Admitting: Family Medicine

## 2023-10-09 ENCOUNTER — Ambulatory Visit: Admitting: Physical Therapy

## 2023-10-09 ENCOUNTER — Ambulatory Visit: Attending: Physical Medicine and Rehabilitation

## 2023-10-09 ENCOUNTER — Encounter: Admitting: Physical Therapy

## 2023-10-09 DIAGNOSIS — R262 Difficulty in walking, not elsewhere classified: Secondary | ICD-10-CM | POA: Insufficient documentation

## 2023-10-09 DIAGNOSIS — M25661 Stiffness of right knee, not elsewhere classified: Secondary | ICD-10-CM | POA: Insufficient documentation

## 2023-10-09 NOTE — Therapy (Signed)
 OUTPATIENT PHYSICAL THERAPY EVALUATION     Patient Name: Traci Davis MRN: [id number] DOB:October 19, 1970, 53 y.o., female Today's Date: 10/09/2023  END OF SESSION:  PT End of Session - 10/09/23 1615     Visit Number 1    Number of Visits 20    Date for PT Re-Evaluation 12/04/23    Authorization Type UHC Medicare Dual (VL based on mn)    Progress Note Due on Visit 10    PT Start Time 1600    PT Stop Time 1640    PT Time Calculation (min) 40 min    Activity Tolerance Patient tolerated treatment well;Patient limited by pain    Behavior During Therapy [facility] for tasks assessed/performed              Past Medical History:  Diagnosis Date   Acute pancreatitis 09/04/2014   Anxiety    Arthritis    Asthma    Cerebral palsy (HCC)    Complication of anesthesia    panic attacks before surgery   COPD (chronic obstructive pulmonary disease) (HCC)    Depression    Diabetes mellitus without complication (HCC)    GERD (gastroesophageal reflux disease)    Hypertension    Noninfectious gastroenteritis and colitis 01/10/2013   Past Surgical History:  Procedure Laterality Date   CESAREAN SECTION  1995   CHOLECYSTECTOMY     DILATATION & CURETTAGE/HYSTEROSCOPY WITH MYOSURE N/A 02/20/2018   Procedure: DILATATION & CURETTAGE/HYSTEROSCOPY WITH MYOSURE ENDOMETRIAL POLYPECTOMY;  Surgeon: Kris Pester, MD;  Location: [facility] ORS;  Service: Gynecology;  Laterality: N/A;   ESOPHAGOGASTRODUODENOSCOPY (EGD) WITH PROPOFOL  N/A 01/01/2019   Procedure: ESOPHAGOGASTRODUODENOSCOPY (EGD) WITH PROPOFOL ;  Surgeon: Selena Daily, MD;  Location: [facility] ENDOSCOPY;  Service: Gastroenterology;  Laterality: N/A;   FOOT CAPSULE RELEASE W/ PERCUTANEOUS HEEL CORD LENGTHENING, TIBIAL TENDON TRANSFER     age 31 ,and 2010-both legs   JOINT REPLACEMENT Right    both knees partial replacements dates unknown   knee replacment     x 5 per pt. last one- left knee 2014. has had 2 on R 3 on L   TYMPANOPLASTY WITH GRAFT  Right 01/08/2018   Procedure: TYMPANOPLASTY WITH POSSIBLE OSSICULAR CHAIN RECONSTRUCTION;  Surgeon: Von Grumbling, MD;  Location: [facility] ORS;  Service: ENT;  Laterality: Right;   Patient Active Problem List   Diagnosis Date Noted   Urge urinary incontinence 04/02/2023   Incontinence of feces 04/02/2023   Vaginal atrophy 04/02/2023   Asthma, persistent controlled 02/06/2023   Continuous leakage of urine 01/04/2023   De Quervain's tenosynovitis, right 10/08/2022   Hot flashes due to menopause 07/17/2022   Convulsions (HCC) 07/09/2022   Obesity due to excess calories 09/22/2020   Insomnia due to medical condition 05/11/2020   Hamstring tendonitis 01/29/2020   MDD (major depressive disorder), recurrent, in full remission (HCC) 08/12/2019   Seizure-like activity (HCC) 07/08/2019   Mild episode of recurrent major depressive disorder (HCC) 12/29/2018   Insomnia due to mental condition 12/29/2018   Disorder of vein 03/04/2018   Lumbar sprain 03/04/2018   Muscle weakness 03/04/2018   Neck sprain 03/04/2018   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   Postmenopausal bleeding 02/18/2018   Impingement syndrome of shoulder region 02/04/2018   Chest pain with moderate risk for cardiac etiology 05/19/2017   GAD (generalized anxiety disorder) 04/15/2017   Chronic daily headache 04/15/2017   Panic attack 04/15/2017   Nocturnal enuresis 04/15/2017   Mild cognitive impairment 04/15/2017   Loss of memory 01/14/2017  Pain medication agreement signed 01/08/2017   Headache disorder 12/04/2016   Chronic, continuous use of opioids 07/01/2015   Vertigo 07/01/2015   Chronic midline low back pain without sciatica 03/21/2015   Type 2 diabetes mellitus with other specified complication (HCC) 09/05/2014   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   Hip pain, chronic 04/28/2013   Essential hypertension 01/10/2013   Irritable colon 01/10/2013   Encounter for long-term (current) use of other medications  12/04/2012   Chronic GERD 08/12/2012   Diarrhea 08/12/2012   Primary localized osteoarthrosis, lower leg 05/07/2012   Difficulty walking 02/06/2012    PCP: Dr. Domingo Friend  REFERRING PROVIDER: Dr. Arlean Bellow  REFERRING DIAG: Cervicalgia  THERAPY DIAG:  Stiffness of right knee, not elsewhere classified  Difficulty in walking, not elsewhere classified  Rationale for Evaluation and Treatment: Rehabilitation ONSET DATE: Jan 2025  SUBJECTIVE:                                                                                                                                                                                                        SUBJECTIVE STATEMENT: Pt still having a lot of pain in right knee and worried about falling.  PERTINENT HISTORY:  Pt reports insidious onset of Rt knee pain and twice weekly buckling episodes starting ~Jan 2025, has constant pain on the femur lateral to the patella. History of remote unicompartmental knee arthroplasty. Pt has not fallen due to this, but she is very concerned about a fall in the future due to her baseline mobility limitations. At eval pain is reported as 6/10 current, 4/10 best in past week, 8/10 worst in past week. Pt manages her pain at home with heat, ice, tylenol  arthritis, and/or percocet.   PAIN:  Anterior Rt knee pain, lateral to the patella, reports pain all the time, but worse after buckling episodes.    PRECAUTIONS: None WEIGHT BEARING RESTRICTIONS: No FALLS:  Has patient fallen in last 6 months? No  OCCUPATION: On disability  PLOF: Independent  PATIENT GOALS: Decrease pain, avoid falling, abolish buckling episodes.   OBJECTIVE:  PATIENT SURVEYS:  LEFS: to be issued on visit #2  ROM:  -Rt knee flexion: 105 degrees  -Rt knee extension: 24 degrees   STRENGTH ASSESSMENT:  -Rt knee extension: 5/5  -Rt hip extension (straight knee, tested in supine, 30 degrees flexion): 3+/5  TODAY'S TREATMENT  10/09/23  -knee extension stretch LLLD, heel propped 1x3 minutes with elastogel ice pack on top -SAQ 1x15x5secH @ 5lb  -Manually resisted Rt hip extension in supine 1x15   -  knee extension stretch LLLD, heel propped 1x3 minutes with elastogel ice pack on top -SAQ 1x15x5secH @ 5lb -Supine RLE gluteal set with heel propped on 2 pillows 1x15    PATIENT EDUCATION:  Education details: Explained how lacking 24 degrees of full extension can increase resting patella loading and increase risk of buckling.   Person educated: Patient Education method: collaborative learning, deliberate practice, positive reinforcement, explicit instruction, establish rules. Education comprehension: verbalized understanding, returned demonstration, verbal cues required, and tactile cues required  HOME EXERCISE PROGRAM: Access Code: [email or web address] URL: [email or web address] Date: 10/09/2023 Prepared by: Atlee Blanks  Exercises - Seated Passive Knee Extension  - 3 x daily - 3 sets - 1 reps - 5 min hold - Seated Long Arc Quad  - 3 x daily - 1 sets - 15 reps - 3sec  hold - Supine Hip Extension with Footstool (BKA)  - 3 x daily - 1 sets - 15 reps - 5sed hold  ASSESSMENT: CLINICAL IMPRESSION: Pt referred to PT for Rt knee pain, buckling. Evaluation revealing of Rt knee contracture s/p unicompartmental arthroplasty, complicated by CP history. Pt has weakness in quads and hip extension, hence control of knee joint in sagittal plan remains precarious. Pt would benefit from any degree of kne extension ROM that can be achieved, as well as any degree of strengthening in quads and hip extensors. Pt will benefit from PT to reduced falls risk, improve walkign tolerance, reduce pain, and reduce caregiver burden.   OBJECTIVE IMPAIRMENTS: decreased ROM, decreased strength, impaired sensation, impaired UE functional use, and pain.   ACTIVITY LIMITATIONS: carrying, lifting, bending, bathing, dressing, reach over head, and  hygiene/grooming PARTICIPATION LIMITATIONS: meal prep, cleaning, laundry, shopping, and community activity PERSONAL FACTORS: Age, Education, Sex, Social background, Time since onset of injury/illness/exacerbation, Transportation, and 3+ comorbidities: Cerebral Palsy, HTN, T2DM are also affecting patient's functional outcome.  REHAB POTENTIAL: Fair (timeline since knee surgery) CLINICAL DECISION MAKING: Medium  EVALUATION COMPLEXITY: complicated    GOALS: Goals reviewed with patient? YES  SHORT TERM GOALS: Target date: 47/11/25 Patient will report comprehension, confidence, and consistent compliance and of a simple home exercise program established to facilitate symptoms management and basic strengthening and/or segment mobility.    Baseline:    Status: New Patient to demonstrate improved score on LEFS by >9% to indicate reduced self-reported disability and/or pain.     Baseline: pending  Status: New   LONG TERM GOALS: Target date: 12/09/23  1. Patient to improve score on LEFS by 20% or greater to indicate reduced disability and improved quality of life.    Baseline:   Status: New   2. Pt to report no episodes of knee buckling in most recent 2 weeks to indicate improved stability ogf knee joint loading in ADL performance.   Baseline: twice weekly episodes  Status: New   3. Pt to report worse pain rating in most recent 2 weeks no greater than 6/10 to indiccate greater stability of pain symptoms with daily actiivty/mobility.   Baseline: 8/10 worst pain  Status: New  PLAN:  PT FREQUENCY: 1-2x/week PT DURATION: 8 weeks PLANNED INTERVENTIONS: 97164- PT Re-evaluation, 97110-Therapeutic exercises, 97530- Therapeutic activity, [id number]- Neuromuscular re-education, [phone]- Self Care, 33295- Manual therapy, [phone]- Gait training, [phone]- Orthotic Fit/training, [phone]- Prosthetic training, [phone]- Aquatic Therapy, [phone]- Electrical stimulation (unattended), [phone]- Electrical stimulation (manual), [id number]-  Traction (mechanical), Patient/Family education, Dry Needling, Joint mobilization, Joint manipulation, Spinal manipulation, Spinal mobilization, Cryotherapy, and Moist heat  PLAN FOR NEXT SESSION:  HEP adjusement as needed, Knee  ROM stretching, look into obtaining a JAS extension brace for home use, quads loading, hip extension strengthening.    4:25 PM, 10/09/23 Dawn Eth, PT, DPT Physical Therapist - [facility]  [phone] [facility])

## 2023-10-10 ENCOUNTER — Encounter: Payer: Self-pay | Admitting: Family Medicine

## 2023-10-14 ENCOUNTER — Encounter: Attending: Family Medicine | Admitting: Dietician

## 2023-10-14 DIAGNOSIS — Z794 Long term (current) use of insulin: Secondary | ICD-10-CM | POA: Diagnosis not present

## 2023-10-14 DIAGNOSIS — Z713 Dietary counseling and surveillance: Secondary | ICD-10-CM | POA: Diagnosis not present

## 2023-10-14 DIAGNOSIS — E1169 Type 2 diabetes mellitus with other specified complication: Secondary | ICD-10-CM | POA: Insufficient documentation

## 2023-10-14 NOTE — Progress Notes (Signed)
 Patient was seen on 10/14/23 for the second of a series of three diabetes self-management courses at the [facility]. The following learning objectives were met by the patient during this class:  Describe the role of different macronutrients on glucose Explain how carbohydrates affect blood glucose State what foods contain the most carbohydrates Demonstrate carbohydrate counting Demonstrate how to read Nutrition Facts food label Describe effects of various fats on heart health Describe the importance of good nutrition for health and healthy eating strategies Describe techniques for managing your shopping, cooking and meal planning List strategies to follow meal plan when dining out Describe the effects of alcohol on glucose and how to use it safely  Goals:  Follow Diabetes Meal Plan as instructed  Aim to spread carbs evenly throughout the day  Aim for 3 meals per day and snacks as needed Include lean protein foods to meals/snacks  Monitor glucose levels as instructed by your doctor   Follow-Up Plan: Attend class 3 on 10/21/23 Work towards following your personal food plan.

## 2023-10-15 ENCOUNTER — Ambulatory Visit: Admitting: Physical Therapy

## 2023-10-15 DIAGNOSIS — R262 Difficulty in walking, not elsewhere classified: Secondary | ICD-10-CM

## 2023-10-15 DIAGNOSIS — M25661 Stiffness of right knee, not elsewhere classified: Secondary | ICD-10-CM | POA: Diagnosis not present

## 2023-10-15 NOTE — Therapy (Signed)
 OUTPATIENT PHYSICAL THERAPY TREATMENT      Patient Name: Traci Davis MRN: [id number] DOB:13-May-1970, 53 y.o., female Today's Date: 10/15/2023  END OF SESSION:  PT End of Session - 10/15/23 1652     Visit Number 2    Number of Visits 20    Date for PT Re-Evaluation 12/04/23    Authorization Type [employer] Medicare Dual (VL based on mn)    Authorization - Visit Number 2    Authorization - Number of Visits 20    Progress Note Due on Visit 10    PT Start Time 1650    PT Stop Time 1730    PT Time Calculation (min) 40 min    Activity Tolerance Patient tolerated treatment well;Patient limited by pain    Behavior During Therapy [facility] for tasks assessed/performed           Past Medical History:  Diagnosis Date   Acute pancreatitis 09/04/2014   Anxiety    Arthritis    Asthma    Cerebral palsy (HCC)    Complication of anesthesia    panic attacks before surgery   COPD (chronic obstructive pulmonary disease) (HCC)    Depression    Diabetes mellitus without complication (HCC)    GERD (gastroesophageal reflux disease)    Hypertension    Noninfectious gastroenteritis and colitis 01/10/2013   Past Surgical History:  Procedure Laterality Date   CESAREAN SECTION  1995   CHOLECYSTECTOMY     DILATATION & CURETTAGE/HYSTEROSCOPY WITH MYOSURE N/A 02/20/2018   Procedure: DILATATION & CURETTAGE/HYSTEROSCOPY WITH MYOSURE ENDOMETRIAL POLYPECTOMY;  Surgeon: Kris Pester, MD;  Location: [facility];  Service: Gynecology;  Laterality: N/A;   ESOPHAGOGASTRODUODENOSCOPY (EGD) WITH PROPOFOL  N/A 01/01/2019   Procedure: ESOPHAGOGASTRODUODENOSCOPY (EGD) WITH PROPOFOL ;  Surgeon: Selena Daily, MD;  Location: [facility];  Service: Gastroenterology;  Laterality: N/A;   FOOT CAPSULE RELEASE W/ PERCUTANEOUS HEEL CORD LENGTHENING, TIBIAL TENDON TRANSFER     age 59 ,and 2010-both legs   JOINT REPLACEMENT Right    both knees partial replacements dates unknown   knee replacment     x 5 per pt. last  one- left knee 2014. has had 2 on R 3 on L   TYMPANOPLASTY WITH GRAFT Right 01/08/2018   Procedure: TYMPANOPLASTY WITH POSSIBLE OSSICULAR CHAIN RECONSTRUCTION;  Surgeon: Von Grumbling, MD;  Location: [facility];  Service: ENT;  Laterality: Right;   Patient Active Problem List   Diagnosis Date Noted   Urge urinary incontinence 04/02/2023   Incontinence of feces 04/02/2023   Vaginal atrophy 04/02/2023   Asthma, persistent controlled 02/06/2023   Continuous leakage of urine 01/04/2023   De Quervain's tenosynovitis, right 10/08/2022   Hot flashes due to menopause 07/17/2022   Convulsions (HCC) 07/09/2022   Obesity due to excess calories 09/22/2020   Insomnia due to medical condition 05/11/2020   Hamstring tendonitis 01/29/2020   MDD (major depressive disorder), recurrent, in full remission (HCC) 08/12/2019   Seizure-like activity (HCC) 07/08/2019   Mild episode of recurrent major depressive disorder (HCC) 12/29/2018   Insomnia due to mental condition 12/29/2018   Disorder of vein 03/04/2018   Lumbar sprain 03/04/2018   Muscle weakness 03/04/2018   Neck sprain 03/04/2018   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   Postmenopausal bleeding 02/18/2018   Impingement syndrome of shoulder region 02/04/2018   Chest pain with moderate risk for cardiac etiology 05/19/2017   GAD (generalized anxiety disorder) 04/15/2017   Chronic daily headache 04/15/2017   Panic attack 04/15/2017   Nocturnal  enuresis 04/15/2017   Mild cognitive impairment 04/15/2017   Loss of memory 01/14/2017   Pain medication agreement signed 01/08/2017   Headache disorder 12/04/2016   Chronic, continuous use of opioids 07/01/2015   Vertigo 07/01/2015   Chronic midline low back pain without sciatica 03/21/2015   Type 2 diabetes mellitus with other specified complication (HCC) 09/05/2014   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   Hip pain, chronic 04/28/2013   Essential hypertension 01/10/2013   Irritable colon 01/10/2013    Encounter for long-term (current) use of other medications 12/04/2012   Chronic GERD 08/12/2012   Diarrhea 08/12/2012   Primary localized osteoarthrosis, lower leg 05/07/2012   Difficulty walking 02/06/2012    PCP: Dr. Domingo Friend  REFERRING PROVIDER: Dr. Arlean Bellow  REFERRING DIAG: Cervicalgia  THERAPY DIAG:  Stiffness of right knee, not elsewhere classified  Difficulty in walking, not elsewhere classified  Rationale for Evaluation and Treatment: Rehabilitation ONSET DATE: Jan 2025  SUBJECTIVE:                                                                                                                                                                                                        SUBJECTIVE STATEMENT: Pt reports that her right knee has not given up since last time.    PERTINENT HISTORY:  Pt reports insidious onset of Rt knee pain and twice weekly buckling episodes starting ~Jan 2025, has constant pain on the femur lateral to the patella. History of remote unicompartmental knee arthroplasty. Pt has not fallen due to this, but she is very concerned about a fall in the future due to her baseline mobility limitations. At eval pain is reported as 6/10 current, 4/10 best in past week, 8/10 worst in past week. Pt manages her pain at home with heat, ice, tylenol  arthritis, and/or percocet.   PAIN:  Anterior Rt knee pain, lateral to the patella, reports pain all the time, but worse after buckling episodes.    PRECAUTIONS: None WEIGHT BEARING RESTRICTIONS: No FALLS:  Has patient fallen in last 6 months? No  OCCUPATION: On disability  PLOF: Independent  PATIENT GOALS: Decrease pain, avoid falling, abolish buckling episodes.   OBJECTIVE:  PATIENT SURVEYS:  LEFS: to be issued on visit #2  ROM:  -Rt knee flexion: 105 degrees  -Rt knee extension: 24 degrees   STRENGTH ASSESSMENT:  -Rt knee extension: 5/5  -Rt hip extension (straight knee, tested  in supine, 30 degrees flexion): 3+/5  TODAY'S TREATMENT   10/15/23  THEREX  Sit to Stand 1 x 5  -Pt  reports increased right lateral joint line pain in knee   Quarter Squat 1 x 10  -Pt reports increased right lateral joint line pain in knee   SLR on RLE 1 x 10  -Pt unable to perform with external rotation due to decreased hip flexibility  SLR on RLE with ER performed after adductor stretch 1 x 10  Supine Bridges 2 x 10   Seated HS Stretch on RLE 2 x 60 sec  Seated HS Stretch on RLE with floated on 1 ft block 1 x 60 sec  Seated HS Stretch on RLE with floated on 1 ft block with strap for forward flexion and ankle DF 2 x 60 sec    MANUAL  HS Stretch Bilateral 6 x 60 sec   Figure 4 Hip Adductor Stretch with PT providing overpressure bilteral 6 x 60 sec   RLE Hip Abductor and Glute Stretch 3 x 60 sec     PATIENT EDUCATION:  Education details: Explained how lacking 24 degrees of full extension can increase resting patella loading and increase risk of buckling.   Person educated: Patient Education method: collaborative learning, deliberate practice, positive reinforcement, explicit instruction, establish rules. Education comprehension: verbalized understanding, returned demonstration, verbal cues required, and tactile cues required  HOME EXERCISE PROGRAM: Access Code: [email or web address] URL: [email or web address] Date: 10/15/2023 Prepared by: Marge Shed  Exercises - Seated Knee Extension Stretch With Overpressure  - 1 x daily - 7 x weekly - 3 reps - 60 sec hold - Seated Long Arc Quad  - 3-4 x daily - 1 sets - 15 reps - 3sec  hold - Supine Bridge with Mini Swiss Ball Between Knees  - 3-4 x weekly - 3 sets - 10 reps - 3 sec  hold  ASSESSMENT: CLINICAL IMPRESSION: Pt limited to non-weight bearing positions due to right lateral knee pain likely from knee OA. She did show improved hip external rotation and decreased hip tension after PT performed hamstring and adductor stretches  as well ability to perform bridges. Pt able to perform hamstring stretches independently.  Pt will benefit from PT to reduced falls risk, improve walkign tolerance, reduce pain, and reduce caregiver burden.   OBJECTIVE IMPAIRMENTS: decreased ROM, decreased strength, impaired sensation, impaired UE functional use, and pain.   ACTIVITY LIMITATIONS: carrying, lifting, bending, bathing, dressing, reach over head, and hygiene/grooming PARTICIPATION LIMITATIONS: meal prep, cleaning, laundry, shopping, and community activity PERSONAL FACTORS: Age, Education, Sex, Social background, Time since onset of injury/illness/exacerbation, Transportation, and 3+ comorbidities: Cerebral Palsy, HTN, T2DM are also affecting patient's functional outcome.  REHAB POTENTIAL: Fair (timeline since knee surgery) CLINICAL DECISION MAKING: Medium  EVALUATION COMPLEXITY: complicated    GOALS: Goals reviewed with patient? YES  SHORT TERM GOALS: Target date: 47/11/25 Patient will report comprehension, confidence, and consistent compliance and of a simple home exercise program established to facilitate symptoms management and basic strengthening and/or segment mobility.    Baseline: NT 10/15/23: Performing exercises independently    Status: ACHIEVED    Patient to demonstrate improved score on LEFS by >9% to indicate reduced self-reported disability and/or pain.     Baseline: pending  Status: ONGOING    LONG TERM GOALS: Target date: 12/09/23  1. Patient to improve score on LEFS by 20% or greater to indicate reduced disability and improved quality of life.    Baseline:   Status: ONGOING    2. Pt to report no episodes of knee buckling in most recent 2 weeks to indicate improved stability ogf knee joint loading  in ADL performance.   Baseline: twice weekly episodes  Status: ONGOING    3. Pt to report worse pain rating in most recent 2 weeks no greater than 6/10 to indiccate greater stability of pain symptoms with daily  actiivty/mobility.   Baseline: 8/10 worst pain  Status: ONGOING   PLAN:  PT FREQUENCY: 1-2x/week PT DURATION: 8 weeks PLANNED INTERVENTIONS: 97164- PT Re-evaluation, 97110-Therapeutic exercises, 97530- Therapeutic activity, 97112- Neuromuscular re-education, 97535- Self Care, 40981- Manual therapy, [phone]- Gait training, [phone]- Orthotic Fit/training, [phone]- Prosthetic training, [phone]- Aquatic Therapy, [phone]- Electrical stimulation (unattended), [phone]- Electrical stimulation (manual), [id number]- Traction (mechanical), Patient/Family education, Dry Needling, Joint mobilization, Joint manipulation, Spinal manipulation, Spinal mobilization, Cryotherapy, and Moist heat  PLAN FOR NEXT SESSION: LEFS. Ongoing manual therapy for hamstring, adductor and abductor stretching. Hip Abductors strengthening and discuss lateral offloading brace    Marge Shed PT, DPT  [facility] [address], [place], [address] Phone: [phone]   Fax:  [phone]

## 2023-10-17 DIAGNOSIS — G801 Spastic diplegic cerebral palsy: Secondary | ICD-10-CM | POA: Diagnosis not present

## 2023-10-21 ENCOUNTER — Encounter

## 2023-10-21 ENCOUNTER — Other Ambulatory Visit: Payer: Self-pay | Admitting: Psychiatry

## 2023-10-21 ENCOUNTER — Other Ambulatory Visit: Payer: Self-pay | Admitting: Family Medicine

## 2023-10-21 DIAGNOSIS — Z794 Long term (current) use of insulin: Secondary | ICD-10-CM

## 2023-10-21 DIAGNOSIS — E1169 Type 2 diabetes mellitus with other specified complication: Secondary | ICD-10-CM | POA: Diagnosis not present

## 2023-10-21 DIAGNOSIS — F411 Generalized anxiety disorder: Secondary | ICD-10-CM

## 2023-10-21 DIAGNOSIS — Z713 Dietary counseling and surveillance: Secondary | ICD-10-CM | POA: Diagnosis not present

## 2023-10-21 NOTE — Telephone Encounter (Unsigned)
 Copied from CRM [id number]. Topic: Clinical - Medication Refill >> Oct 21, 2023  4:22 PM Montie POUR wrote: Medication: Continuous Glucose Sensor (DEXCOM G7 SENSOR)  Has the patient contacted their pharmacy? Yes (Agent: If no, request that the patient contact the pharmacy for the refill. If patient does not wish to contact the pharmacy document the reason why and proceed with request.) (Agent: If yes, when and what did the pharmacy advise?)  This is the patient's preferred pharmacy:  [facility] - [place], [place] - [address] [place] [address] Phone: [phone] Fax: [phone]  Is this the correct pharmacy for this prescription? Yes If no, delete pharmacy and type the correct one.   Has the prescription been filled recently? No  Is the patient out of the medication? Yes - She has been out for 2 days  Has the patient been seen for an appointment in the last year OR does the patient have an upcoming appointment? Yes  Can we respond through MyChart? Yes  Agent: Please be advised that Rx refills may take up to 3 business days. We ask that you follow-up with your pharmacy.

## 2023-10-21 NOTE — Telephone Encounter (Unsigned)
 Copied from CRM [id number]. Topic: Clinical - Medication Refill >> Oct 21, 2023  2:22 PM Montie POUR wrote: Medication: Continuous Glucose Sensor (DEXCOM G7 SENSOR)   Has the patient contacted their pharmacy? Yes (Agent: If no, request that the patient contact the pharmacy for the refill. If patient does not wish to contact the pharmacy document the reason why and proceed with request.) (Agent: If yes, when and what did the pharmacy advise?) Pharmacy needs order to refill  This is the patient's preferred pharmacy:  [facility] - [place], [place] - [address] [place] [address] Phone: [phone] Fax: [phone]  Is this the correct pharmacy for this prescription? Yes If no, delete pharmacy and type the correct one.   Has the prescription been filled recently? No  Is the patient out of the medication? Yes - She has been out for 2 days  Has the patient been seen for an appointment in the last year OR does the patient have an upcoming appointment? Yes  Can we respond through MyChart? Yes  Agent: Please be advised that Rx refills may take up to 3 business days. We ask that you follow-up with your pharmacy.

## 2023-10-21 NOTE — Progress Notes (Signed)
 Patient was seen on 10/21/23 for the third of a series of three diabetes self-management courses at the [facility].   State the amount of activity recommended for healthy living Describe activities suitable for individual needs Identify ways to regularly incorporate activity into daily life Identify barriers to activity and ways to over come these barriers Identify diabetes medications being personally used and their primary action for lowering glucose and possible side effects Describe role of stress on blood glucose and develop strategies to address psychosocial issues Identify diabetes complications and ways to prevent them Explain how to manage diabetes during illness Evaluate success in meeting personal goal Establish 1-3 goals that they will plan to diligently work on  Goals:  I will be active 15 minutes or more 3 times a week To help manage stress I will  visit gym at least 2 times a week  Your patient has identified these potential barriers to change:  Motivation Finances Stress  Your patient has identified their diabetes self-care support plan as  Family and friend Support     Plan: return for 1:1 follow up in 3-6 months

## 2023-10-23 ENCOUNTER — Ambulatory Visit: Admitting: Physical Therapy

## 2023-10-23 ENCOUNTER — Encounter: Payer: Self-pay | Admitting: Family Medicine

## 2023-10-23 DIAGNOSIS — M25661 Stiffness of right knee, not elsewhere classified: Secondary | ICD-10-CM | POA: Diagnosis not present

## 2023-10-23 DIAGNOSIS — R262 Difficulty in walking, not elsewhere classified: Secondary | ICD-10-CM | POA: Diagnosis not present

## 2023-10-23 MED ORDER — DEXCOM G7 SENSOR MISC: 3 refills | Status: DC

## 2023-10-23 NOTE — Therapy (Signed)
 OUTPATIENT PHYSICAL THERAPY TREATMENT      Patient Name: Traci Davis MRN: [id number] DOB:April 29, 1971, 53 y.o., female Today's Date: 10/23/2023  END OF SESSION:  PT End of Session - 10/23/23 1740     Visit Number 3    Number of Visits 20    Date for PT Re-Evaluation 12/04/23    Authorization Type [employer]HC Medicare Dual (VL based on mn)    Authorization - Visit Number 3    Authorization - Number of Visits 20    Progress Note Due on Visit 10    PT Start Time 1730    PT Stop Time 1815    PT Time Calculation (min) 45 min    Activity Tolerance Patient tolerated treatment well    Behavior During Therapy [facility] for tasks assessed/performed            Past Medical History:  Diagnosis Date   Acute pancreatitis 09/04/2014   Anxiety    Arthritis    Asthma    Cerebral palsy (HCC)    Complication of anesthesia    panic attacks before surgery   COPD (chronic obstructive pulmonary disease) (HCC)    Depression    Diabetes mellitus without complication (HCC)    GERD (gastroesophageal reflux disease)    Hypertension    Noninfectious gastroenteritis and colitis 01/10/2013   Past Surgical History:  Procedure Laterality Date   CESAREAN SECTION  1995   CHOLECYSTECTOMY     DILATATION & CURETTAGE/HYSTEROSCOPY WITH MYOSURE N/A 02/20/2018   Procedure: DILATATION & CURETTAGE/HYSTEROSCOPY WITH MYOSURE ENDOMETRIAL POLYPECTOMY;  Surgeon: Leonce Garnette BIRCH, MD;  Location: [facility] OR[facility];  Service: Gynecology;  Laterality: N/A;   ESOPHAGOGASTRODUODENOSCOPY (EGD) WITH PROPOFOL  N/A 01/01/2019   Procedure: ESOPHAGOGASTRODUODENOSCOPY (EGD) WITH PROPOFOL ;  Surgeon: Unk Corinn Skiff, MD;  Location: [facility];  Service: Gastroenterology;  Laterality: N/A;   FOOT CAPSULE RELEASE W/ PERCUTANEOUS HEEL CORD LENGTHENING, TIBIAL TENDON TRANSFER     age 89 ,and 2010-both legs   JOINT REPLACEMENT Right    both knees partial replacements dates unknown   knee replacment     x 5 per pt. last one- left knee 2014.  has had 2 on R 3 on L   TYMPANOPLASTY WITH GRAFT Right 01/08/2018   Procedure: TYMPANOPLASTY WITH POSSIBLE OSSICULAR CHAIN RECONSTRUCTION;  Surgeon: Blair Mt, MD;  Location: [facility] OR[facility];  Service: ENT;  Laterality: Right;   Patient Active Problem List   Diagnosis Date Noted   Urge urinary incontinence 04/02/2023   Incontinence of feces 04/02/2023   Vaginal atrophy 04/02/2023   Asthma, persistent controlled 02/06/2023   Continuous leakage of urine 01/04/2023   De Quervain's tenosynovitis, right 10/08/2022   Hot flashes due to menopause 07/17/2022   Convulsions (HCC) 07/09/2022   Obesity due to excess calories 09/22/2020   Insomnia due to medical condition 05/11/2020   Hamstring tendonitis 01/29/2020   MDD (major depressive disorder), recurrent, in full remission (HCC) 08/12/2019   Seizure-like activity (HCC) 07/08/2019   Mild episode of recurrent major depressive disorder (HCC) 12/29/2018   Insomnia due to mental condition 12/29/2018   Disorder of vein 03/04/2018   Lumbar sprain 03/04/2018   Muscle weakness 03/04/2018   Neck sprain 03/04/2018   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   Postmenopausal bleeding 02/18/2018   Impingement syndrome of shoulder region 02/04/2018   Chest pain with moderate risk for cardiac etiology 05/19/2017   GAD (generalized anxiety disorder) 04/15/2017   Chronic daily headache 04/15/2017   Panic attack 04/15/2017   Nocturnal enuresis 04/15/2017  Mild cognitive impairment 04/15/2017   Loss of memory 01/14/2017   Pain medication agreement signed 01/08/2017   Headache disorder 12/04/2016   Chronic, continuous use of opioids 07/01/2015   Vertigo 07/01/2015   Chronic midline low back pain without sciatica 03/21/2015   Type 2 diabetes mellitus with other specified complication (HCC) 09/05/2014   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   Hip pain, chronic 04/28/2013   Essential hypertension 01/10/2013   Irritable colon 01/10/2013   Encounter for  long-term (current) use of other medications 12/04/2012   Chronic GERD 08/12/2012   Diarrhea 08/12/2012   Primary localized osteoarthrosis, lower leg 05/07/2012   Difficulty walking 02/06/2012    PCP: Dr. Marsa Officer  REFERRING PROVIDER: Dr. Suzen Brantley Salters  REFERRING DIAG: Cervicalgia  THERAPY DIAG:  Stiffness of right knee, not elsewhere classified  Difficulty in walking, not elsewhere classified  Rationale for Evaluation and Treatment: Rehabilitation ONSET DATE: Jan 2025  SUBJECTIVE:                                                                                                                                                                                                        SUBJECTIVE STATEMENT: Pt reports that her right leg continues to get stiffer and that it is harder to walk with left side dragging. She reports increased pain and discomfort in the medial posterior surface of her knee.   PERTINENT HISTORY:  Pt reports insidious onset of Rt knee pain and twice weekly buckling episodes starting ~Jan 2025, has constant pain on the femur lateral to the patella. History of remote unicompartmental knee arthroplasty. Pt has not fallen due to this, but she is very concerned about a fall in the future due to her baseline mobility limitations. At eval pain is reported as 6/10 current, 4/10 best in past week, 8/10 worst in past week. Pt manages her pain at home with heat, ice, tylenol  arthritis, and/or percocet.   PAIN:  Anterior Rt knee pain, lateral to the patella, reports pain all the time, but worse after buckling episodes.    PRECAUTIONS: None WEIGHT BEARING RESTRICTIONS: No FALLS:  Has patient fallen in last 6 months? No  OCCUPATION: On disability  PLOF: Independent  PATIENT GOALS: Decrease pain, avoid falling, abolish buckling episodes.   OBJECTIVE:  PATIENT SURVEYS:  LEFS: to be issued on visit #2  ROM:  -Rt knee flexion: 105 degrees  -Rt knee  extension: 24 degrees   STRENGTH ASSESSMENT:  -Rt knee extension: 5/5  -Rt hip extension (straight knee, tested in supine, 30 degrees flexion): 3+/5  TODAY'S TREATMENT   10/23/23: THEREX    Seated HS and Calf Stretch on RLE  2 x 30 sec  Seated Marches on LLE 1 x 10  Supine Hip ER with blue band 2 x 10  Supine Hip ER with green band  2 x 10  Supine Marches 2 x 10   LEFS 19/80 (24%)  MANUAL:  HS Stretch RLE 2 x 60 sec   HS Stretch LLE 2 x 60 sec  Hip Flexor Stretch Calf Stretch RLE  Hip Adductor Stretch RLE 2 x 60 sec  Hip Adductor Stretch LLE 2 x 60 sec  Calf Stretch on RLE  2 x 60 sec    Marked spasticity in right hamstring and adductors    10/15/23  THEREX  Sit to Stand 1 x 5  -Pt reports increased right lateral joint line pain in knee   Quarter Squat 1 x 10  -Pt reports increased right lateral joint line pain in knee   SLR on RLE 1 x 10  -Pt unable to perform with external rotation due to decreased hip flexibility  SLR on RLE with ER performed after adductor stretch 1 x 10  Supine Bridges 2 x 10   Seated HS Stretch on RLE 2 x 60 sec  Seated HS Stretch on RLE with floated on 1 ft block 1 x 60 sec  Seated HS Stretch on RLE with floated on 1 ft block with strap for forward flexion and ankle DF 2 x 60 sec    MANUAL  HS Stretch Bilateral 6 x 60 sec   Figure 4 Hip Adductor Stretch with PT providing overpressure bilteral 6 x 60 sec   RLE Hip Abductor and Glute Stretch 3 x 60 sec     PATIENT EDUCATION:  Education details: Explained how lacking 24 degrees of full extension can increase resting patella loading and increase risk of buckling.   Person educated: Patient Education method: collaborative learning, deliberate practice, positive reinforcement, explicit instruction, establish rules. Education comprehension: verbalized understanding, returned demonstration, verbal cues required, and tactile cues required  HOME EXERCISE PROGRAM: Access Code: [email or web address] URL:  [email or web address] Date: 10/23/2023 Prepared by: Toribio Servant  Exercises - Seated Hamstring Stretch with Strap  - 1 x daily - 7 x weekly - 3 reps - 30-60s sec  hold - Seated Long Arc Quad  - 3-4 x daily - 1 sets - 15 reps - 3sec  hold - Supine Bridge with Mini Swiss Ball Between Knees  - 3-4 x weekly - 3 sets - 10 reps - 3 sec  hold - Bent Knee Fallouts  - 3-4 x weekly - 3 sets - 10 reps - Supine March  - 3-4 x weekly - 3 sets - 10 reps  ASSESSMENT: CLINICAL IMPRESSION: Pt is limited by what appears to be increased spasticity in LLE and RLE. She is have difficulty progressing RLE due to ongoing stiffness. She did show improvement with pain related to spasticity in RLE and she was able to progress RLE easier after static stretching. Pt focused on strengthening of hip flexors and and abductors to help patient with LE progression during gait cycle especially on LLE which is locked into full extension and relies on circumduction to progress limb. She will benefit from PT to reduced falls risk, improve walkign tolerance, reduce pain, and reduce caregiver burden.     OBJECTIVE IMPAIRMENTS: decreased ROM, decreased strength, impaired sensation, impaired UE functional use, and pain.   ACTIVITY LIMITATIONS: carrying, lifting, bending, bathing, dressing,  reach over head, and hygiene/grooming PARTICIPATION LIMITATIONS: meal prep, cleaning, laundry, shopping, and community activity PERSONAL FACTORS: Age, Education, Sex, Social background, Time since onset of injury/illness/exacerbation, Transportation, and 3+ comorbidities: Cerebral Palsy, HTN, T2DM are also affecting patient's functional outcome.  REHAB POTENTIAL: Fair (timeline since knee surgery) CLINICAL DECISION MAKING: Medium  EVALUATION COMPLEXITY: complicated    GOALS: Goals reviewed with patient? YES  SHORT TERM GOALS: Target date: 47/11/25 Patient will report comprehension, confidence, and consistent compliance and of  a simple home exercise program established to facilitate symptoms management and basic strengthening and/or segment mobility.    Baseline: NT 10/15/23: Performing exercises independently    Status: ACHIEVED    Patient to demonstrate improved score on LEFS by >9% to indicate reduced self-reported disability and/or pain.     Baseline: pending  Status: 19/80 or 24%   LONG TERM GOALS: Target date: 12/09/23  1. Patient to improve score on LEFS by 20% or greater to indicate reduced disability and improved quality of life.    Baseline: 19/80 (24%)  Status: ONGOING    2. Pt to report no episodes of knee buckling in most recent 2 weeks to indicate improved stability ogf knee joint loading in ADL performance.   Baseline: twice weekly episodes  Status: ONGOING    3. Pt to report worse pain rating in most recent 2 weeks no greater than 6/10 to indiccate greater stability of pain symptoms with daily actiivty/mobility.   Baseline: 8/10 worst pain  Status: ONGOING   PLAN:  PT FREQUENCY: 1-2x/week PT DURATION: 8 weeks PLANNED INTERVENTIONS: 97164- PT Re-evaluation, 97110-Therapeutic exercises, 97530- Therapeutic activity, 97112- Neuromuscular re-education, 97535- Self Care, [id number]- Manual therapy, [phone]- Gait training, [phone]- Orthotic Fit/training, [phone]- Prosthetic training, [phone]- Aquatic Therapy, [phone]- Electrical stimulation (unattended), [phone]- Electrical stimulation (manual), [id number]- Traction (mechanical), Patient/Family education, Dry Needling, Joint mobilization, Joint manipulation, Spinal manipulation, Spinal mobilization, Cryotherapy, and Moist heat  PLAN FOR NEXT SESSION: Ongoing manual therapy for hamstring, adductor and abductor stretching. Discuss lateral offloading brace.     Toribio Servant PT, DPT  [facility] [address] [place], [address] Phone: [phone]   Fax:  [phone]

## 2023-10-23 NOTE — Telephone Encounter (Signed)
 Requested medication (s) are due for refill today: na   Requested medication (s) are on the active medication list: no   Last refill:  dexcom G7 device / not sensor- historical   Future visit scheduled: no   Notes to clinic:   patient requesting dexcom G7 sensor , historical medication for dexcom G7 device. Do you want to order sensor?     Requested Prescriptions  Pending Prescriptions Disp Refills   Continuous Glucose Sensor (DEXCOM G7 SENSOR) MISC       Endocrinology: Diabetes - Testing Supplies Failed - 10/23/2023  1:22 PM      Failed - Valid encounter within last 12 months    Recent Outpatient Visits           2 weeks ago 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[facility])   [place] [facility] [place], Marsa PARAS, DO   1 month ago COVID-19   [place] [place] Marsa PARAS, DO   2 months ago Exposure to confirmed case of COVID-19   [facility] [place], Angeline ORN, [place]

## 2023-10-24 ENCOUNTER — Other Ambulatory Visit: Payer: Self-pay | Admitting: Psychiatry

## 2023-10-24 ENCOUNTER — Other Ambulatory Visit: Payer: Self-pay | Admitting: Family Medicine

## 2023-10-24 DIAGNOSIS — R110 Nausea: Secondary | ICD-10-CM

## 2023-10-24 DIAGNOSIS — F411 Generalized anxiety disorder: Secondary | ICD-10-CM

## 2023-10-25 NOTE — Telephone Encounter (Signed)
 Requested medications are due for refill today.  yes  Requested medications are on the active medications list.  yes  Last refill. 09/24/2023 #30 2 rf  Future visit scheduled.   no  Notes to clinic.  Refill not delegated.    Requested Prescriptions  Pending Prescriptions Disp Refills   ondansetron  (ZOFRAN -ODT) 4 MG disintegrating tablet [Pharmacy Med Name: ONDANSETRON  4 MG ODT] 30 tablet 2    Sig: DISSOLVE 1 TABLET ON THE TONGUE EVERY 8 HOURS AS NEEDED FOR NAUSEA AND VOMITING     Not Delegated - Gastroenterology: Antiemetics - ondansetron  Failed - 10/25/2023  1:53 PM      Failed - This refill cannot be delegated      Failed - Valid encounter within last 6 months    Recent Outpatient Visits           2 weeks ago 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[facility])   [place] [facility] [place], Marsa PARAS, DO   2 months ago COVID-19   [facility] [place], Marsa PARAS, DO   2 months ago Exposure to confirmed case of COVID-19   [place] [facility] [place], [place] W, NP              Passed - AST in normal range and within 360 days    AST  Date Value Ref Range Status  05/31/2023 21 10 - 35 U/L Final   SGOT(AST)  Date Value Ref Range Status  04/15/2014 15 15 - 37 Unit/L Final         Passed - ALT in normal range and within 360 days    ALT  Date Value Ref Range Status  05/31/2023 26 6 - 29 U/L Final   SGPT (ALT)  Date Value Ref Range Status  04/15/2014 23 U/L Final    Comment:    14-63 NOTE: New Reference Range 11/17/13

## 2023-10-29 ENCOUNTER — Ambulatory Visit: Attending: Physical Medicine and Rehabilitation | Admitting: Physical Therapy

## 2023-10-29 ENCOUNTER — Encounter: Payer: Self-pay | Admitting: Physical Therapy

## 2023-10-29 DIAGNOSIS — G801 Spastic diplegic cerebral palsy: Secondary | ICD-10-CM | POA: Diagnosis not present

## 2023-10-29 DIAGNOSIS — G8929 Other chronic pain: Secondary | ICD-10-CM | POA: Diagnosis not present

## 2023-10-29 DIAGNOSIS — M25561 Pain in right knee: Secondary | ICD-10-CM | POA: Diagnosis not present

## 2023-10-29 DIAGNOSIS — M25661 Stiffness of right knee, not elsewhere classified: Secondary | ICD-10-CM | POA: Diagnosis not present

## 2023-10-29 DIAGNOSIS — R262 Difficulty in walking, not elsewhere classified: Secondary | ICD-10-CM | POA: Diagnosis not present

## 2023-10-29 NOTE — Therapy (Unsigned)
 OUTPATIENT PHYSICAL THERAPY TREATMENT      Patient Name: Traci Davis MRN: [id number] DOB:May 22, 1970, 53 y.o., female Today's Date: 10/29/2023  END OF SESSION:  PT End of Session - 10/29/23 1656     Visit Number 4    Number of Visits 20    Date for PT Re-Evaluation 12/04/23    Authorization Type UHC Medicare Dual (VL based on mn)    Authorization - Visit Number 4    Authorization - Number of Visits 20    Progress Note Due on Visit 10    PT Start Time 1650    PT Stop Time 1730    PT Time Calculation (min) 40 min    Activity Tolerance Patient tolerated treatment well    Behavior During Therapy [facility] for tasks assessed/performed            Past Medical History:  Diagnosis Date   Acute pancreatitis 09/04/2014   Anxiety    Arthritis    Asthma    Cerebral palsy (HCC)    Complication of anesthesia    panic attacks before surgery   COPD (chronic obstructive pulmonary disease) (HCC)    Depression    Diabetes mellitus without complication (HCC)    GERD (gastroesophageal reflux disease)    Hypertension    Noninfectious gastroenteritis and colitis 01/10/2013   Past Surgical History:  Procedure Laterality Date   CESAREAN SECTION  1995   CHOLECYSTECTOMY     DILATATION & CURETTAGE/HYSTEROSCOPY WITH MYOSURE N/A 02/20/2018   Procedure: DILATATION & CURETTAGE/HYSTEROSCOPY WITH MYOSURE ENDOMETRIAL POLYPECTOMY;  Surgeon: Leonce Garnette BIRCH, MD;  Location: [facility] ORS;  Service: Gynecology;  Laterality: N/A;   ESOPHAGOGASTRODUODENOSCOPY (EGD) WITH PROPOFOL  N/A 01/01/2019   Procedure: ESOPHAGOGASTRODUODENOSCOPY (EGD) WITH PROPOFOL ;  Surgeon: Unk Corinn Skiff, MD;  Location: [facility] ENDOS[facility];  Service: Gastroenterology;  Laterality: N/A;   FOOT CAPSULE RELEASE W/ PERCUTANEOUS HEEL CORD LENGTHENING, TIBIAL TENDON TRANSFER     age 23 ,and 2010-both legs   JOINT REPLACEMENT Right    both knees partial replacements dates unknown   knee replacment     x 5 per pt. last one- left knee 2014.  has had 2 on R 3 on L   TYMPANOPLASTY WITH GRAFT Right 01/08/2018   Procedure: TYMPANOPLASTY WITH POSSIBLE OSSICULAR CHAIN RECONSTRUCTION;  Surgeon: Blair Mt, MD;  Location: [facility] ORS;  Service: ENT;  Laterality: Right;   Patient Active Problem List   Diagnosis Date Noted   Urge urinary incontinence 04/02/2023   Incontinence of feces 04/02/2023   Vaginal atrophy 04/02/2023   Asthma, persistent controlled 02/06/2023   Continuous leakage of urine 01/04/2023   De Quervain's tenosynovitis, right 10/08/2022   Hot flashes due to menopause 07/17/2022   Convulsions (HCC) 07/09/2022   Obesity due to excess calories 09/22/2020   Insomnia due to medical condition 05/11/2020   Hamstring tendonitis 01/29/2020   MDD (major depressive disorder), recurrent, in full remission (HCC) 08/12/2019   Seizure-like activity (HCC) 07/08/2019   Mild episode of recurrent major depressive disorder (HCC) 12/29/2018   Insomnia due to mental condition 12/29/2018   Disorder of vein 03/04/2018   Lumbar sprain 03/04/2018   Muscle weakness 03/04/2018   Neck sprain 03/04/2018   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   Postmenopausal bleeding 02/18/2018   Impingement syndrome of shoulder region 02/04/2018   Chest pain with moderate risk for cardiac etiology 05/19/2017   GAD (generalized anxiety disorder) 04/15/2017   Chronic daily headache 04/15/2017   Panic attack 04/15/2017   Nocturnal enuresis 04/15/2017  Mild cognitive impairment 04/15/2017   Loss of memory 01/14/2017   Pain medication agreement signed 01/08/2017   Headache disorder 12/04/2016   Chronic, continuous use of opioids 07/01/2015   Vertigo 07/01/2015   Chronic midline low back pain without sciatica 03/21/2015   Type 2 diabetes mellitus with other specified complication (HCC) 09/05/2014   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   Hip pain, chronic 04/28/2013   Essential hypertension 01/10/2013   Irritable colon 01/10/2013   Encounter for  long-term (current) use of other medications 12/04/2012   Chronic GERD 08/12/2012   Diarrhea 08/12/2012   Primary localized osteoarthrosis, lower leg 05/07/2012   Difficulty walking 02/06/2012    PCP: Dr. Marsa Officer  REFERRING PROVIDER: Dr. Suzen Brantley Salters  REFERRING DIAG: Cervicalgia  THERAPY DIAG:  Stiffness of right knee, not elsewhere classified  Difficulty in walking, not elsewhere classified  Rationale for Evaluation and Treatment: Rehabilitation ONSET DATE: Jan 2025  SUBJECTIVE:                                                                                                                                                                                                        SUBJECTIVE STATEMENT: Pt states that she is feeling increased pain in the lateral joint line of her right knee and right glute med.     PERTINENT HISTORY:  Pt reports insidious onset of Rt knee pain and twice weekly buckling episodes starting ~Jan 2025, has constant pain on the femur lateral to the patella. History of remote unicompartmental knee arthroplasty. Pt has not fallen due to this, but she is very concerned about a fall in the future due to her baseline mobility limitations. At eval pain is reported as 6/10 current, 4/10 best in past week, 8/10 worst in past week. Pt manages her pain at home with heat, ice, tylenol  arthritis, and/or percocet.   PAIN:  Anterior Rt knee pain, lateral to the patella, reports pain all the time, but worse after buckling episodes.    PRECAUTIONS: None WEIGHT BEARING RESTRICTIONS: No FALLS:  Has patient fallen in last 6 months? No  OCCUPATION: On disability  PLOF: Independent  PATIENT GOALS: Decrease pain, avoid falling, abolish buckling episodes.   OBJECTIVE:  PATIENT SURVEYS:  LEFS: to be issued on visit #2  ROM:  -Rt knee flexion: 105 degrees  -Rt knee extension: 24 degrees   STRENGTH ASSESSMENT:  -Rt knee extension: 5/5  -Rt hip  extension (straight knee, tested in supine, 30 degrees flexion): 3+/5  TODAY'S TREATMENT   10/29/23: THEREX  Seated Lumbar Flexion AAROM  [employer], Right, and Left   Lower Trunk Rotation to Right for left sided glute stretch 2 x 30 sec  IT Band Stretch on RLE  2 x 60 sec   Straight Leg Raise on RLE 3 x 10   Supine Bent Knee Fall Out or Hip IR and ER AROM 2 x 10  Supine Single Leg Hip ER with Green band 2 x 10 Supine Bridges with Bands for Hip ER 1 x 10  -Pt reports increased pain that radiates up right side of low back Supine Bridges with isometric hip adduction squeeze 1 x 10  -Pt reports no low back pain.   MANUAL: Pt providing overpressure  Supine Left Glute Med Stretch 3 x 60 sec  Supine Hip Adductor Stretch 4 x 60 sec   Supine HS Stretch on RLE 4 x 60 sec   Supine IT Band Stretch Left and Right 4 x 60 sec   Supine Calf Stretch 4 x 60 sec     PATIENT EDUCATION:  Education details: Explained how lacking 24 degrees of full extension can increase resting patella loading and increase risk of buckling.   Person educated: Patient Education method: collaborative learning, deliberate practice, positive reinforcement, explicit instruction, establish rules. Education comprehension: verbalized understanding, returned demonstration, verbal cues required, and tactile cues required  HOME EXERCISE PROGRAM: Access Code: [email or web address] URL: [email or web address] Date: 10/29/2023 Prepared by: Toribio Servant  Exercises - Supine ITB Stretch with Strap  - 1 x daily - 7 x weekly - 3 reps - 30-60 sec hold - Seated Hamstring Stretch with Strap  - 1 x daily - 7 x weekly - 3 reps - 30-60s sec  hold - Bent Knee Fallouts  - 3-4 x weekly - 3 sets - 10 reps - Supine March  - 3-4 x weekly - 3 sets - 10 reps - Active Straight Leg Raise with Quad Set  - 3-4 x weekly - 2 sets - 10 reps   ASSESSMENT: CLINICAL IMPRESSION: Pt shows improvement in hip flexor strength during session with  ability to perform partial straight leg raise. She continues to be limited by ongoing spasticity in bilateral adductors and she is following up for Botox injections with her provider next week. Her worsening stiffness has also limited her gait making it more difficult to advance both feet especially RLE. Pt's ongoing lateral right knee pain resulting from severe lateral joint line OA. She would benefit from a lateral off loading brace to help offload lateral joint line to relieve pain. PT to follow up with provider about ordering brace and what this entails. She will continue to benefit from PT to reduced falls risk, improve walkign tolerance, reduce pain, and reduce caregiver burden.   OBJECTIVE IMPAIRMENTS: decreased ROM, decreased strength, impaired sensation, impaired UE functional use, and pain.   ACTIVITY LIMITATIONS: carrying, lifting, bending, bathing, dressing, reach over head, and hygiene/grooming PARTICIPATION LIMITATIONS: meal prep, cleaning, laundry, shopping, and community activity PERSONAL FACTORS: Age, Education, Sex, Social background, Time since onset of injury/illness/exacerbation, Transportation, and 3+ comorbidities: Cerebral Palsy, HTN, T2DM are also affecting patient's functional outcome.  REHAB POTENTIAL: Fair (timeline since knee surgery) CLINICAL DECISION MAKING: Medium  EVALUATION COMPLEXITY: complicated    GOALS: Goals reviewed with patient? YES  SHORT TERM GOALS: Target date: 11/08/23 Patient will report comprehension, confidence, and consistent compliance and of a simple home exercise program established to facilitate symptoms management and basic strengthening and/or segment mobility.    Baseline: NT 10/15/23: Performing exercises independently  Status: ACHIEVED    Patient to demonstrate improved score on LEFS by >9% to indicate reduced self-reported disability and/or pain.     Baseline: pending  Status: 19/80 or 24%   LONG TERM GOALS: Target date: 12/09/23  1.  Patient to improve score on LEFS by 20% or greater to indicate reduced disability and improved quality of life.    Baseline: 19/80 (24%)  Status: ONGOING    2. Pt to report no episodes of knee buckling in most recent 2 weeks to indicate improved stability ogf knee joint loading in ADL performance.   Baseline: twice weekly episodes  Status: ONGOING    3. Pt to report worse pain rating in most recent 2 weeks no greater than 6/10 to indiccate greater stability of pain symptoms with daily actiivty/mobility.   Baseline: 8/10 worst pain  Status: ONGOING   PLAN:  PT FREQUENCY: 1-2x/week PT DURATION: 8 weeks PLANNED INTERVENTIONS: 97164- PT Re-evaluation, 97110-Therapeutic exercises, 97530- Therapeutic activity, 97112- Neuromuscular re-education, 97535- Self Care, 02859- Manual therapy, [phone]- Gait training, [phone]- Orthotic Fit/training, [phone]- Prosthetic training, [phone]- Aquatic Therapy, [phone]- Electrical stimulation (unattended), [phone]- Electrical stimulation (manual), [id number]- Traction (mechanical), Patient/Family education, Dry Needling, Joint mobilization, Joint manipulation, Spinal manipulation, Spinal mobilization, Cryotherapy, and Moist heat  PLAN FOR NEXT SESSION: LEFS to reassess short term goals. Ongoing manual therapy for hamstring, adductor and abductor stretching. Focus on standing exercises if spasticity has improved from Botox injections. Otherwise continue with supine or seated hip flexion and abduction strengthening exercises: extended knee hip flexion cone clearance.    Toribio Servant PT, DPT  [facility] [address], [place], [address] Phone: [phone]   Fax:  [phone]

## 2023-10-30 ENCOUNTER — Other Ambulatory Visit: Payer: Self-pay | Admitting: Psychiatry

## 2023-10-30 ENCOUNTER — Telehealth: Payer: Self-pay | Admitting: Family Medicine

## 2023-10-30 ENCOUNTER — Other Ambulatory Visit: Payer: Self-pay

## 2023-10-30 DIAGNOSIS — F411 Generalized anxiety disorder: Secondary | ICD-10-CM

## 2023-10-30 DIAGNOSIS — K219 Gastro-esophageal reflux disease without esophagitis: Secondary | ICD-10-CM

## 2023-10-30 MED ORDER — OMEPRAZOLE 40 MG PO CPDR: 40.0000 mg | DELAYED_RELEASE_CAPSULE | Freq: Two times a day (BID) | ORAL | 3 refills | Status: AC

## 2023-10-30 NOTE — Telephone Encounter (Unsigned)
 Copied from CRM [id number]. Topic: Clinical - Medication Refill >> Oct 30, 2023  4:36 PM Tiffini S wrote: Medication: omeprazole  (PRILOSEC) 40 MG capsule  Has the patient contacted their pharmacy? Yes (Agent: If no, request that the patient contact the pharmacy for the refill. If patient does not wish to contact the pharmacy document the reason why and proceed with request.) (Agent: If yes, when and what did the pharmacy advise?)  This is the patient's preferred pharmacy:  [facility] - [place], [place] - [address] [place] [address] Phone: [phone] Fax: [phone]  Is this the correct pharmacy for this prescription? Yes If no, delete pharmacy and type the correct one.   Has the prescription been filled recently? Yes  Is the patient out of the medication? Yes, she have three tablets left   Has the patient been seen for an appointment in the last year OR does the patient have an upcoming appointment? Yes  Can we respond through MyChart? No, patient asked for a phone call to confirm if the pharmacy was filled in May   Agent: Please be advised that Rx refills may take up to 3 business days. We ask that you follow-up with your pharmacy.

## 2023-10-30 NOTE — Telephone Encounter (Signed)
 Refill sent in to [facility]

## 2023-10-31 ENCOUNTER — Ambulatory Visit: Admitting: Physical Therapy

## 2023-10-31 DIAGNOSIS — M25561 Pain in right knee: Secondary | ICD-10-CM | POA: Diagnosis not present

## 2023-10-31 DIAGNOSIS — M25661 Stiffness of right knee, not elsewhere classified: Secondary | ICD-10-CM

## 2023-10-31 DIAGNOSIS — G8929 Other chronic pain: Secondary | ICD-10-CM | POA: Diagnosis not present

## 2023-10-31 DIAGNOSIS — R262 Difficulty in walking, not elsewhere classified: Secondary | ICD-10-CM

## 2023-10-31 NOTE — Therapy (Signed)
 OUTPATIENT PHYSICAL THERAPY TREATMENT      Patient Name: Traci Davis MRN: [id number] DOB:[birth year], 53 y.o., female Today's Date: 10/31/2023  END OF SESSION:  PT End of Session - 10/31/23 1622     Visit Number 5    Number of Visits 20    Date for PT Re-Evaluation 12/04/23    Authorization Type UHC Medicare Dual (VL based on mn)    Authorization - Visit Number 5    Authorization - Number of Visits 20    Progress Note Due on Visit 10    PT Start Time 1620    PT Stop Time 1650    PT Time Calculation (min) 30 min    Activity Tolerance Patient tolerated treatment well    Behavior During Therapy [facility] for tasks assessed/performed            Past Medical History:  Diagnosis Date   Acute pancreatitis 09/04/2014   Anxiety    Arthritis    Asthma    Cerebral palsy (HCC)    Complication of anesthesia    panic attacks before surgery   COPD (chronic obstructive pulmonary disease) (HCC)    Depression    Diabetes mellitus without complication (HCC)    GERD (gastroesophageal reflux disease)    Hypertension    Noninfectious gastroenteritis and colitis 01/10/2013   Past Surgical History:  Procedure Laterality Date   CESAREAN SECTION  1995   CHOLECYSTECTOMY     DILATATION & CURETTAGE/HYSTEROSCOPY WITH MYOSURE N/A 02/20/2018   Procedure: DILATATION & CURETTAGE/HYSTEROSCOPY WITH MYOSURE ENDOMETRIAL POLYPECTOMY;  Surgeon: Leonce Garnette BIRCH, MD;  Location: [facility] ORS;  Service: Gynecology;  Laterality: N/A;   ESOPHAGOGASTRODUODENOSCOPY (EGD) WITH PROPOFOL  N/A 01/01/2019   Procedure: ESOPHAGOGASTRODUODENOSCOPY (EGD) WITH PROPOFOL ;  Surgeon: Unk Corinn Skiff, MD;  Location: [facility];  Service: Gastroenterology;  Laterality: N/A;   FOOT CAPSULE RELEASE W/ PERCUTANEOUS HEEL CORD LENGTHENING, TIBIAL TENDON TRANSFER     age 3 ,and 2010-both legs   JOINT REPLACEMENT Right    both knees partial replacements dates unknown   knee replacment     x 5 per pt. last one- left knee 2014.  has had 2 on R 3 on L   TYMPANOPLASTY WITH GRAFT Right 01/08/2018   Procedure: TYMPANOPLASTY WITH POSSIBLE OSSICULAR CHAIN RECONSTRUCTION;  Surgeon: Blair Mt, MD;  Location: [facility] ORS;  Service: ENT;  Laterality: Right;   Patient Active Problem List   Diagnosis Date Noted   Urge urinary incontinence 04/02/2023   Incontinence of feces 04/02/2023   Vaginal atrophy 04/02/2023   Asthma, persistent controlled 02/06/2023   Continuous leakage of urine 01/04/2023   De Quervain's tenosynovitis, right 10/08/2022   Hot flashes due to menopause 07/17/2022   Convulsions (HCC) 07/09/2022   Obesity due to excess calories 09/22/2020   Insomnia due to medical condition 05/11/2020   Hamstring tendonitis 01/29/2020   MDD (major depressive disorder), recurrent, in full remission (HCC) 08/12/2019   Seizure-like activity (HCC) 07/08/2019   Mild episode of recurrent major depressive disorder (HCC) 12/29/2018   Insomnia due to mental condition 12/29/2018   Disorder of vein 03/04/2018   Lumbar sprain 03/04/2018   Muscle weakness 03/04/2018   Neck sprain 03/04/2018   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   Postmenopausal bleeding 02/18/2018   Impingement syndrome of shoulder region 02/04/2018   Chest pain with moderate risk for cardiac etiology 05/19/2017   GAD (generalized anxiety disorder) 04/15/2017   Chronic daily headache 04/15/2017   Panic attack 04/15/2017   Nocturnal enuresis 04/15/2017  Mild cognitive impairment 04/15/2017   Loss of memory 01/14/2017   Pain medication agreement signed 01/08/2017   Headache disorder 12/04/2016   Chronic, continuous use of opioids 07/01/2015   Vertigo 07/01/2015   Chronic midline low back pain without sciatica 03/21/2015   Type 2 diabetes mellitus with other specified complication (HCC) 09/05/2014   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   Hip pain, chronic 04/28/2013   Essential hypertension 01/10/2013   Irritable colon 01/10/2013   Encounter for  long-term (current) use of other medications 12/04/2012   Chronic GERD 08/12/2012   Diarrhea 08/12/2012   Primary localized osteoarthrosis, lower leg 05/07/2012   Difficulty walking 02/06/2012    PCP: Dr. Marsa Officer  REFERRING PROVIDER: Dr. Suzen Brantley Salters  REFERRING DIAG: Cervicalgia  THERAPY DIAG:  Stiffness of right knee, not elsewhere classified  Difficulty in walking, not elsewhere classified  Rationale for Evaluation and Treatment: Rehabilitation ONSET DATE: Jan 2025  SUBJECTIVE:                                                                                                                                                                                                        SUBJECTIVE STATEMENT: Pt reports increased muscle spasms in both her calfs after last session especially in her left calf.    PERTINENT HISTORY:  Pt reports insidious onset of Rt knee pain and twice weekly buckling episodes starting ~Jan 2025, has constant pain on the femur lateral to the patella. History of remote unicompartmental knee arthroplasty. Pt has not fallen due to this, but she is very concerned about a fall in the future due to her baseline mobility limitations. At eval pain is reported as 6/10 current, 4/10 best in past week, 8/10 worst in past week. Pt manages her pain at home with heat, ice, tylenol  arthritis, and/or percocet.   PAIN:  Anterior Rt knee pain, lateral to the patella, reports pain all the time, but worse after buckling episodes.    PRECAUTIONS: None WEIGHT BEARING RESTRICTIONS: No FALLS:  Has patient fallen in last 6 months? No  OCCUPATION: On disability  PLOF: Independent  PATIENT GOALS: Decrease pain, avoid falling, abolish buckling episodes.   OBJECTIVE:  PATIENT SURVEYS:  LEFS: to be issued on visit #2  ROM:  -Rt knee flexion: 105 degrees  -Rt knee extension: 24 degrees   STRENGTH ASSESSMENT:  -Rt knee extension: 5/5  -Rt hip extension  (straight knee, tested in supine, 30 degrees flexion): 3+/5  TODAY'S TREATMENT   10/31/23:  MANUAL  Hip Adductor Stretch 2 x 60 sec  HS Stretch 4 x 60 sec  Hip ER Figure 4 Stretch on RLE 4 x 60 sec   Forced Dorsiflexion of LLE: Negative for clonus   Self Supine Hip Figure 4 Stretch on RLE  2 x 60 sec     PATIENT EDUCATION:  Education details: Explained how lacking 24 degrees of full extension can increase resting patella loading and increase risk of buckling.   Person educated: Patient Education method: collaborative learning, deliberate practice, positive reinforcement, explicit instruction, establish rules. Education comprehension: verbalized understanding, returned demonstration, verbal cues required, and tactile cues required  HOME EXERCISE PROGRAM: Access Code: [email or web address] URL: [email or web address] Date: 10/29/2023 Prepared by: Toribio Servant  Exercises - Supine ITB Stretch with Strap  - 1 x daily - 7 x weekly - 3 reps - 30-60 sec hold - Seated Hamstring Stretch with Strap  - 1 x daily - 7 x weekly - 3 reps - 30-60s sec  hold - Bent Knee Fallouts  - 3-4 x weekly - 3 sets - 10 reps - Supine March  - 3-4 x weekly - 3 sets - 10 reps - Active Straight Leg Raise with Quad Set  - 3-4 x weekly - 2 sets - 10 reps   ASSESSMENT: CLINICAL IMPRESSION: Pt did not exhibit an increase in pain after passive stretch of hip and knee musculature and PT unable to reproduce calf muscle spasms. Clonus negative with forced dorsiflexion of left foot indicating that spasms are likely more a result of ongoing worsening spasticity in bilateral lower extremities which patient is following up with referring physician about next week. She will continue to benefit from PT to reduced falls risk, improve walkign tolerance, reduce pain, and reduce caregiver burden.    OBJECTIVE IMPAIRMENTS: decreased ROM, decreased strength, impaired sensation, impaired UE functional use, and pain.   ACTIVITY  LIMITATIONS: carrying, lifting, bending, bathing, dressing, reach over head, and hygiene/grooming PARTICIPATION LIMITATIONS: meal prep, cleaning, laundry, shopping, and community activity PERSONAL FACTORS: Age, Education, Sex, Social background, Time since onset of injury/illness/exacerbation, Transportation, and 3+ comorbidities: Cerebral Palsy, HTN, T2DM are also affecting patient's functional outcome.  REHAB POTENTIAL: Fair (timeline since knee surgery) CLINICAL DECISION MAKING: Medium  EVALUATION COMPLEXITY: complicated    GOALS: Goals reviewed with patient? YES  SHORT TERM GOALS: Target date: 11/08/23 Patient will report comprehension, confidence, and consistent compliance and of a simple home exercise program established to facilitate symptoms management and basic strengthening and/or segment mobility.    Baseline: NT 10/15/23: Performing exercises independently    Status: ACHIEVED    Patient to demonstrate improved score on LEFS by >9% to indicate reduced self-reported disability and/or pain.     Baseline: pending  Status: 19/80 or 24%   LONG TERM GOALS: Target date: 12/09/23  1. Patient to improve score on LEFS by 20% or greater to indicate reduced disability and improved quality of life.    Baseline: 19/80 (24%)  Status: ONGOING    2. Pt to report no episodes of knee buckling in most recent 2 weeks to indicate improved stability ogf knee joint loading in ADL performance.   Baseline: twice weekly episodes  Status: ONGOING    3. Pt to report worse pain rating in most recent 2 weeks no greater than 6/10 to indiccate greater stability of pain symptoms with daily actiivty/mobility.   Baseline: 8/10 worst pain  Status: ONGOING   PLAN:  PT FREQUENCY: 1-2x/week PT DURATION: 8 weeks PLANNED INTERVENTIONS: 97164- PT Re-evaluation, 97110-Therapeutic exercises, 97530- Therapeutic activity, [id number]- Neuromuscular re-education,  02464- Self Care, 02859- Manual therapy, [id number]- Gait training,  [phone]- Orthotic Fit/training, [id number]- Prosthetic training, [phone]- Aquatic Therapy, [phone]- Electrical stimulation (unattended), [phone]- Electrical stimulation (manual), [id number]- Traction (mechanical), Patient/Family education, Dry Needling, Joint mobilization, Joint manipulation, Spinal manipulation, Spinal mobilization, Cryotherapy, and Moist heat  PLAN FOR NEXT SESSION: LEFS to reassess short term goals. supine or seated hip flexion and abduction strengthening exercises: extended knee hip flexion cone clearance. Ongoing manual therapy for hamstring, adductor and abductor stretching. Focus on standing exercises if spasticity has improved from Botox injections.     Toribio Servant PT, DPT  [facility] [address] [place], [address] Phone: [phone]   Fax:  [phone]

## 2023-11-04 ENCOUNTER — Telehealth: Payer: Self-pay | Admitting: Pharmacist

## 2023-11-04 ENCOUNTER — Ambulatory Visit: Admitting: Physical Therapy

## 2023-11-04 ENCOUNTER — Encounter
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
## 2023-11-05 DIAGNOSIS — M79604 Pain in right leg: Secondary | ICD-10-CM | POA: Diagnosis not present

## 2023-11-05 DIAGNOSIS — G801 Spastic diplegic cerebral palsy: Secondary | ICD-10-CM | POA: Diagnosis not present

## 2023-11-05 DIAGNOSIS — M79605 Pain in left leg: Secondary | ICD-10-CM | POA: Diagnosis not present

## 2023-11-06 ENCOUNTER — Ambulatory Visit: Admitting: Physical Therapy

## 2023-11-06 DIAGNOSIS — M542 Cervicalgia: Secondary | ICD-10-CM | POA: Diagnosis not present

## 2023-11-06 DIAGNOSIS — M25561 Pain in right knee: Secondary | ICD-10-CM | POA: Diagnosis not present

## 2023-11-06 DIAGNOSIS — M25562 Pain in left knee: Secondary | ICD-10-CM | POA: Diagnosis not present

## 2023-11-07 DIAGNOSIS — E1165 Type 2 diabetes mellitus with hyperglycemia: Secondary | ICD-10-CM | POA: Diagnosis not present

## 2023-11-07 DIAGNOSIS — I152 Hypertension secondary to endocrine disorders: Secondary | ICD-10-CM | POA: Diagnosis not present

## 2023-11-07 DIAGNOSIS — E785 Hyperlipidemia, unspecified: Secondary | ICD-10-CM | POA: Diagnosis not present

## 2023-11-07 DIAGNOSIS — Z794 Long term (current) use of insulin: Secondary | ICD-10-CM | POA: Diagnosis not present

## 2023-11-07 DIAGNOSIS — E1169 Type 2 diabetes mellitus with other specified complication: Secondary | ICD-10-CM | POA: Diagnosis not present

## 2023-11-07 DIAGNOSIS — E1159 Type 2 diabetes mellitus with other circulatory complications: Secondary | ICD-10-CM | POA: Diagnosis not present

## 2023-11-07 DIAGNOSIS — Z8719 Personal history of other diseases of the digestive system: Secondary | ICD-10-CM | POA: Diagnosis not present

## 2023-11-11 ENCOUNTER — Ambulatory Visit: Admitting: Physical Therapy

## 2023-11-11 DIAGNOSIS — R262 Difficulty in walking, not elsewhere classified: Secondary | ICD-10-CM

## 2023-11-11 DIAGNOSIS — M25661 Stiffness of right knee, not elsewhere classified: Secondary | ICD-10-CM

## 2023-11-11 DIAGNOSIS — E1165 Type 2 diabetes mellitus with hyperglycemia: Secondary | ICD-10-CM | POA: Diagnosis not present

## 2023-11-11 DIAGNOSIS — G8929 Other chronic pain: Secondary | ICD-10-CM | POA: Diagnosis not present

## 2023-11-11 DIAGNOSIS — Z794 Long term (current) use of insulin: Secondary | ICD-10-CM | POA: Diagnosis not present

## 2023-11-11 DIAGNOSIS — E119 Type 2 diabetes mellitus without complications: Secondary | ICD-10-CM | POA: Diagnosis not present

## 2023-11-11 DIAGNOSIS — M25561 Pain in right knee: Secondary | ICD-10-CM | POA: Diagnosis not present

## 2023-11-11 NOTE — Therapy (Signed)
 OUTPATIENT PHYSICAL THERAPY TREATMENT      Patient Name: Traci Davis MRN: [id number] DOB:12-Sep-1970, 53 y.o., female Today's Date: 11/11/2023  END OF SESSION:  PT End of Session - 11/11/23 1608     Visit Number 6    Number of Visits 20    Date for PT Re-Evaluation 12/04/23    Authorization Type UHC Medicare Dual (VL based on mn)    Authorization - Visit Number 6    Authorization - Number of Visits 20    Progress Note Due on Visit 10    PT Start Time 1605    PT Stop Time 1645    PT Time Calculation (min) 40 min    Activity Tolerance Patient tolerated treatment well    Behavior During Therapy [facility] for tasks assessed/performed            Past Medical History:  Diagnosis Date   Acute pancreatitis 09/04/2014   Anxiety    Arthritis    Asthma    Cerebral palsy (HCC)    Complication of anesthesia    panic attacks before surgery   COPD (chronic obstructive pulmonary disease) (HCC)    Depression    Diabetes mellitus without complication (HCC)    GERD (gastroesophageal reflux disease)    Hypertension    Noninfectious gastroenteritis and colitis 01/10/2013   Past Surgical History:  Procedure Laterality Date   CESAREAN SECTION  1995   CHOLECYSTECTOMY     DILATATION & CURETTAGE/HYSTEROSCOPY WITH MYOSURE N/A 02/20/2018   Procedure: DILATATION & CURETTAGE/HYSTEROSCOPY WITH MYOSURE ENDOMETRIAL POLYPECTOMY;  Surgeon: Leonce Garnette BIRCH, MD;  Location: [facility];  Service: Gynecology;  Laterality: N/A;   ESOPHAGOGASTRODUODENOSCOPY (EGD) WITH PROPOFOL  N/A 01/01/2019   Procedure: ESOPHAGOGASTRODUODENOSCOPY (EGD) WITH PROPOFOL ;  Surgeon: Unk Corinn Skiff, MD;  Location: [facility];  Service: Gastroenterology;  Laterality: N/A;   FOOT CAPSULE RELEASE W/ PERCUTANEOUS HEEL CORD LENGTHENING, TIBIAL TENDON TRANSFER     age 38 ,and 2010-both legs   JOINT REPLACEMENT Right    both knees partial replacements dates unknown   knee replacment     x 5 per pt. last one- left knee 2014.  has had 2 on R 3 on L   TYMPANOPLASTY WITH GRAFT Right 01/08/2018   Procedure: TYMPANOPLASTY WITH POSSIBLE OSSICULAR CHAIN RECONSTRUCTION;  Surgeon: Blair Mt, MD;  Location: [facility];  Service: ENT;  Laterality: Right;   Patient Active Problem List   Diagnosis Date Noted   Urge urinary incontinence 04/02/2023   Incontinence of feces 04/02/2023   Vaginal atrophy 04/02/2023   Asthma, persistent controlled 02/06/2023   Continuous leakage of urine 01/04/2023   De Quervain's tenosynovitis, right 10/08/2022   Hot flashes due to menopause 07/17/2022   Convulsions (HCC) 07/09/2022   Obesity due to excess calories 09/22/2020   Insomnia due to medical condition 05/11/2020   Hamstring tendonitis 01/29/2020   MDD (major depressive disorder), recurrent, in full remission (HCC) 08/12/2019   Seizure-like activity (HCC) 07/08/2019   Mild episode of recurrent major depressive disorder (HCC) 12/29/2018   Insomnia due to mental condition 12/29/2018   Disorder of vein 03/04/2018   Lumbar sprain 03/04/2018   Muscle weakness 03/04/2018   Neck sprain 03/04/2018   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   Postmenopausal bleeding 02/18/2018   Impingement syndrome of shoulder region 02/04/2018   Chest pain with moderate risk for cardiac etiology 05/19/2017   GAD (generalized anxiety disorder) 04/15/2017   Chronic daily headache 04/15/2017   Panic attack 04/15/2017   Nocturnal enuresis 04/15/2017  Mild cognitive impairment 04/15/2017   Loss of memory 01/14/2017   Pain medication agreement signed 01/08/2017   Headache disorder 12/04/2016   Chronic, continuous use of opioids 07/01/2015   Vertigo 07/01/2015   Chronic midline low back pain without sciatica 03/21/2015   Type 2 diabetes mellitus with other specified complication (HCC) 09/05/2014   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   Hip pain, chronic 04/28/2013   Essential hypertension 01/10/2013   Irritable colon 01/10/2013   Encounter for  long-term (current) use of other medications 12/04/2012   Chronic GERD 08/12/2012   Diarrhea 08/12/2012   Primary localized osteoarthrosis, lower leg 05/07/2012   Difficulty walking 02/06/2012    PCP: Dr. Marsa Officer  REFERRING PROVIDER: Dr. Suzen Brantley Salters  REFERRING DIAG: Cervicalgia  THERAPY DIAG:  Stiffness of right knee, not elsewhere classified  Difficulty in walking, not elsewhere classified  Rationale for Evaluation and Treatment: Rehabilitation ONSET DATE: Jan 2025  SUBJECTIVE:                                                                                                                                                                                                        SUBJECTIVE STATEMENT: Pt reports increased muscle spasms in both her calfs after last session especially in her left calf.    PERTINENT HISTORY:  Pt reports insidious onset of Rt knee pain and twice weekly buckling episodes starting ~Jan 2025, has constant pain on the femur lateral to the patella. History of remote unicompartmental knee arthroplasty. Pt has not fallen due to this, but she is very concerned about a fall in the future due to her baseline mobility limitations. At eval pain is reported as 6/10 current, 4/10 best in past week, 8/10 worst in past week. Pt manages her pain at home with heat, ice, tylenol  arthritis, and/or percocet.   PAIN:  Anterior Rt knee pain, lateral to the patella, reports pain all the time, but worse after buckling episodes.    PRECAUTIONS: None WEIGHT BEARING RESTRICTIONS: No FALLS:  Has patient fallen in last 6 months? No  OCCUPATION: On disability  PLOF: Independent  PATIENT GOALS: Decrease pain, avoid falling, abolish buckling episodes.   OBJECTIVE:  PATIENT SURVEYS:  LEFS: to be issued on visit #2  ROM:  -Rt knee flexion: 105 degrees  -Rt knee extension: 24 degrees   STRENGTH ASSESSMENT:  -Rt knee extension: 5/5  -Rt hip extension  (straight knee, tested in supine, 30 degrees flexion): 3+/5  TODAY'S TREATMENT   11/11/23: THEREX   LEFS 20/80 (25%)  Modified Debby Dales with  adduction of right hip adduction 2 x 60 sec   -Pt reports increased stretch in right hip abductors   Lower Trunk Rotation to left 2 x 60 sec   -Pt reports increased low back pain   Supine Bridges 1 x 8  -Pt reports increased low back pain  Ball Roll (Silver ) Lumbar AAROM Flexion Center, Left, Right  3 x 30   Supine Bridges 1 x 8   -Pt still experiencing an increase low back      PATIENT EDUCATION:  Education details: Explained how lacking 24 degrees of full extension can increase resting patella loading and increase risk of buckling.   Person educated: Patient Education method: collaborative learning, deliberate practice, positive reinforcement, explicit instruction, establish rules. Education comprehension: verbalized understanding, returned demonstration, verbal cues required, and tactile cues required  HOME EXERCISE PROGRAM: Access Code: [email or web address] URL: [email or web address] Date: 11/11/2023 Prepared by: Toribio Servant  Program Notes Seated Towel Slides 3 x 30 x 7 days per week   Exercises - Modified Thomas Stretch  - 1 x daily - 7 x weekly - 3 reps - 60 secsec hold - Seated Hamstring Stretch  - 1 x daily - 7 x weekly - 3 reps - 60 sec hold - Bent Knee Fallouts  - 3-4 x weekly - 3 sets - 10 reps - Supine March  - 3-4 x weekly - 3 sets - 10 reps - Active Straight Leg Raise with Quad Set  - 3-4 x weekly - 2 sets - 10 reps   ASSESSMENT: CLINICAL IMPRESSION: Pt limited by low back pain during session especially with lumbar extension. Modified exercises to avoid extension and she was able to perform all flexion based exercises without an increase in her low back pain. Changed hip abduction stretch to avoid increased lateral knee joint pain with hip adduction in modified thomas position. She will continue to benefit from  PT to reduced falls risk, improve walkign tolerance, reduce pain, and reduce caregiver burden.     OBJECTIVE IMPAIRMENTS: decreased ROM, decreased strength, impaired sensation, impaired UE functional use, and pain.   ACTIVITY LIMITATIONS: carrying, lifting, bending, bathing, dressing, reach over head, and hygiene/grooming PARTICIPATION LIMITATIONS: meal prep, cleaning, laundry, shopping, and community activity PERSONAL FACTORS: Age, Education, Sex, Social background, Time since onset of injury/illness/exacerbation, Transportation, and 3+ comorbidities: Cerebral Palsy, HTN, T2DM are also affecting patient's functional outcome.  REHAB POTENTIAL: Fair (timeline since knee surgery) CLINICAL DECISION MAKING: Medium  EVALUATION COMPLEXITY: complicated    GOALS: Goals reviewed with patient? YES  SHORT TERM GOALS: Target date: 11/08/23 Patient will report comprehension, confidence, and consistent compliance and of a simple home exercise program established to facilitate symptoms management and basic strengthening and/or segment mobility.    Baseline: NT 10/15/23: Performing exercises independently    Status: ACHIEVED    Patient to demonstrate improved score on LEFS by >9% to indicate reduced self-reported disability and/or pain.     Baseline: pending  Status: 19/80 or 24%   11/11/23: 20/80 or  25%    LONG TERM GOALS: Target date: 12/09/23  1. Patient to improve score on LEFS by 20% or greater to indicate reduced disability and improved quality of life.    Baseline: 19/80 (24%) 11/11/23: 20/80 or 25%   Status: ONGOING    2. Pt to report no episodes of knee buckling in most recent 2 weeks to indicate improved stability ogf knee joint loading in ADL performance.   Baseline: twice weekly episodes  Status: ONGOING  3. Pt to report worse pain rating in most recent 2 weeks no greater than 6/10 to indiccate greater stability of pain symptoms with daily actiivty/mobility.   Baseline: 8/10 worst  pain  Status: ONGOING   PLAN:  PT FREQUENCY: 1-2x/week PT DURATION: 8 weeks PLANNED INTERVENTIONS: 97164- PT Re-evaluation, 97110-Therapeutic exercises, 97530- Therapeutic activity, 97112- Neuromuscular re-education, 97535- Self Care, 02859- Manual therapy, [phone]- Gait training, [phone]- Orthotic Fit/training, [phone]- Prosthetic training, [phone]- Aquatic Therapy, [phone]- Electrical stimulation (unattended), [phone]- Electrical stimulation (manual), [id number]- Traction (mechanical), Patient/Family education, Dry Needling, Joint mobilization, Joint manipulation, Spinal manipulation, Spinal mobilization, Cryotherapy, and Moist heat  PLAN FOR NEXT SESSION: Focus on mini-squats with banded for ER. Standing Marches. Hip Abduction with patient on incline.   Toribio Servant PT, DPT  [facility] [address] [place], [address] Phone: [phone]   Fax:  [phone]

## 2023-11-13 ENCOUNTER — Ambulatory Visit: Admitting: Physical Therapy

## 2023-11-13 DIAGNOSIS — R262 Difficulty in walking, not elsewhere classified: Secondary | ICD-10-CM

## 2023-11-13 DIAGNOSIS — M25661 Stiffness of right knee, not elsewhere classified: Secondary | ICD-10-CM | POA: Diagnosis not present

## 2023-11-13 DIAGNOSIS — G8929 Other chronic pain: Secondary | ICD-10-CM

## 2023-11-13 DIAGNOSIS — M25561 Pain in right knee: Secondary | ICD-10-CM | POA: Diagnosis not present

## 2023-11-13 NOTE — Therapy (Signed)
 OUTPATIENT PHYSICAL THERAPY TREATMENT      Patient Name: Traci Davis MRN: [id number] DOB:1970-10-29, 53 y.o., female Today's Date: 11/13/2023  END OF SESSION:  PT End of Session - 11/13/23 2217     Visit Number 7    Number of Visits 20    Date for PT Re-Evaluation 12/04/23    Authorization Type UHC Medicare Dual (VL based on mn)    Authorization - Visit Number 7    Authorization - Number of Visits 20    Progress Note Due on Visit 10    PT Start Time 1605    PT Stop Time 1645    PT Time Calculation (min) 40 min    Activity Tolerance Patient tolerated treatment well    Behavior During Therapy [facility] for tasks assessed/performed             Past Medical History:  Diagnosis Date   Acute pancreatitis 09/04/2014   Anxiety    Arthritis    Asthma    Cerebral palsy (HCC)    Complication of anesthesia    panic attacks before surgery   COPD (chronic obstructive pulmonary disease) (HCC)    Depression    Diabetes mellitus without complication (HCC)    GERD (gastroesophageal reflux disease)    Hypertension    Noninfectious gastroenteritis and colitis 01/10/2013   Past Surgical History:  Procedure Laterality Date   CESAREAN SECTION  1995   CHOLECYSTECTOMY     DILATATION & CURETTAGE/HYSTEROSCOPY WITH MYOSURE N/A 02/20/2018   Procedure: DILATATION & CURETTAGE/HYSTEROSCOPY WITH MYOSURE ENDOMETRIAL POLYPECTOMY;  Surgeon: Leonce Garnette BIRCH, MD;  Location: [facility];  Service: Gynecology;  Laterality: N/A;   ESOPHAGOGASTRODUODENOSCOPY (EGD) WITH PROPOFOL  N/A 01/01/2019   Procedure: ESOPHAGOGASTRODUODENOSCOPY (EGD) WITH PROPOFOL ;  Surgeon: Unk Corinn Skiff, MD;  Location: [facility];  Service: Gastroenterology;  Laterality: N/A;   FOOT CAPSULE RELEASE W/ PERCUTANEOUS HEEL CORD LENGTHENING, TIBIAL TENDON TRANSFER     age 7 ,and 2010-both legs   JOINT REPLACEMENT Right    both knees partial replacements dates unknown   knee replacment     x 5 per pt. last one- left knee 2014.  has had 2 on R 3 on L   TYMPANOPLASTY WITH GRAFT Right 01/08/2018   Procedure: TYMPANOPLASTY WITH POSSIBLE OSSICULAR CHAIN RECONSTRUCTION;  Surgeon: Blair Mt, MD;  Location: [facility];  Service: ENT;  Laterality: Right;   Patient Active Problem List   Diagnosis Date Noted   Urge urinary incontinence 04/02/2023   Incontinence of feces 04/02/2023   Vaginal atrophy 04/02/2023   Asthma, persistent controlled 02/06/2023   Continuous leakage of urine 01/04/2023   De Quervain's tenosynovitis, right 10/08/2022   Hot flashes due to menopause 07/17/2022   Convulsions (HCC) 07/09/2022   Obesity due to excess calories 09/22/2020   Insomnia due to medical condition 05/11/2020   Hamstring tendonitis 01/29/2020   MDD (major depressive disorder), recurrent, in full remission (HCC) 08/12/2019   Seizure-like activity (HCC) 07/08/2019   Mild episode of recurrent major depressive disorder (HCC) 12/29/2018   Insomnia due to mental condition 12/29/2018   Disorder of vein 03/04/2018   Lumbar sprain 03/04/2018   Muscle weakness 03/04/2018   Neck sprain 03/04/2018   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   Postmenopausal bleeding 02/18/2018   Impingement syndrome of shoulder region 02/04/2018   Chest pain with moderate risk for cardiac etiology 05/19/2017   GAD (generalized anxiety disorder) 04/15/2017   Chronic daily headache 04/15/2017   Panic attack 04/15/2017   Nocturnal enuresis  04/15/2017   Mild cognitive impairment 04/15/2017   Loss of memory 01/14/2017   Pain medication agreement signed 01/08/2017   Headache disorder 12/04/2016   Chronic, continuous use of opioids 07/01/2015   Vertigo 07/01/2015   Chronic midline low back pain without sciatica 03/21/2015   Type 2 diabetes mellitus with other specified complication (HCC) 09/05/2014   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   Hip pain, chronic 04/28/2013   Essential hypertension 01/10/2013   Irritable colon 01/10/2013   Encounter for  long-term (current) use of other medications 12/04/2012   Chronic GERD 08/12/2012   Diarrhea 08/12/2012   Primary localized osteoarthrosis, lower leg 05/07/2012   Difficulty walking 02/06/2012    PCP: Dr. Marsa Officer  REFERRING PROVIDER: Dr. Suzen Brantley Salters  REFERRING DIAG: Cervicalgia  THERAPY DIAG:  Chronic pain of right knee  Difficulty in walking, not elsewhere classified  Rationale for Evaluation and Treatment: Rehabilitation ONSET DATE: Jan 2025  SUBJECTIVE:                                                                                                                                                                                                        SUBJECTIVE STATEMENT: Pt states that she has still not experiencing improvement in her spasticity since receiving shot, but that this should happen within next two weeks.    PERTINENT HISTORY:  Pt reports insidious onset of Rt knee pain and twice weekly buckling episodes starting ~Jan 2025, has constant pain on the femur lateral to the patella. History of remote unicompartmental knee arthroplasty. Pt has not fallen due to this, but she is very concerned about a fall in the future due to her baseline mobility limitations. At eval pain is reported as 6/10 current, 4/10 best in past week, 8/10 worst in past week. Pt manages her pain at home with heat, ice, tylenol  arthritis, and/or percocet.   PAIN:  Anterior Rt knee pain, lateral to the patella, reports pain all the time, but worse after buckling episodes.    PRECAUTIONS: None WEIGHT BEARING RESTRICTIONS: No FALLS:  Has patient fallen in last 6 months? No  OCCUPATION: On disability  PLOF: Independent  PATIENT GOALS: Decrease pain, avoid falling, abolish buckling episodes.   OBJECTIVE:  PATIENT SURVEYS:  LEFS: to be issued on visit #2  ROM:  -Rt knee flexion: 105 degrees  -Rt knee extension: 24 degrees   STRENGTH ASSESSMENT:  -Rt knee extension:  5/5  -Rt hip extension (straight knee, tested in supine, 30 degrees flexion): 3+/5  TODAY'S TREATMENT   11/13/23: JERRE  Lumbar Flexion AROM with silver  ball flexion center  2 x 25   Lateral weight shift with BUE support 2 x 10  Hip Hitch on left hip with 5 sec hold  2 x 10  Hip Hitch on right hip with 5 sec hold 2 x 10  Seated Marches on LLE with knee  brace unlocked 1 x 10  Seated Marches on LLE with knee brace unlocked and forward toe tap on orange ankle weight 1 x 10   Standing Marches on RLE with BUE support and internal rotation of left hip for increased left foot contact 2 x 10     PATIENT EDUCATION:  Education details: Explained how lacking 24 degrees of full extension can increase resting patella loading and increase risk of buckling.   Person educated: Patient Education method: collaborative learning, deliberate practice, positive reinforcement, explicit instruction, establish rules. Education comprehension: verbalized understanding, returned demonstration, verbal cues required, and tactile cues required  HOME EXERCISE PROGRAM: Access Code: [email or web address] URL: [email or web address] Date: 11/13/2023 Prepared by: Toribio Servant  Program Notes Seated Towel Slides 3 x 30 x 7 days per week   Exercises - Modified Thomas Stretch  - 1 x daily - 7 x weekly - 3 reps - 60 secsec hold - Seated Hamstring Stretch  - 1 x daily - 7 x weekly - 3 reps - 60 sec hold - Active Straight Leg Raise with Quad Set  - 3-4 x weekly - 2 sets - 10 reps - Standing Hip Hiking  - 3-4 x weekly - 2 sets - 10 reps - 5 sec hold - Seated March  - 3-4 x weekly - 2 sets - 10 reps - Standing March with Counter Support  - 3-4 x weekly - 2 sets - 10 reps  ASSESSMENT: CLINICAL IMPRESSION: Pt exhibits improvement in pain tolerance with ability to perform hip strengthening exercises in standing. She shows improvement in hip strength with ability to perform standing hip hinge on both LE's.  She  continues to be limited by lack of mobility in LLE due to lack of knee extensor control and need for knee to be locked into extension resulting in longer swing limb and circumduction for swing limb advancement as well as decreased LE stability and balance with need for increased hip internal rotation and need for increased left pelvic forward rotation to maintain a stable LLE stance leg with advancement of RLE during gait cycle. She will continue to benefit from PT to reduced falls risk, improve walkign tolerance, reduce pain, and reduce caregiver burden.   OBJECTIVE IMPAIRMENTS: decreased ROM, decreased strength, impaired sensation, impaired UE functional use, and pain.   ACTIVITY LIMITATIONS: carrying, lifting, bending, bathing, dressing, reach over head, and hygiene/grooming PARTICIPATION LIMITATIONS: meal prep, cleaning, laundry, shopping, and community activity PERSONAL FACTORS: Age, Education, Sex, Social background, Time since onset of injury/illness/exacerbation, Transportation, and 3+ comorbidities: Cerebral Palsy, HTN, T2DM are also affecting patient's functional outcome.  REHAB POTENTIAL: Fair (timeline since knee surgery) CLINICAL DECISION MAKING: Medium  EVALUATION COMPLEXITY: complicated    GOALS: Goals reviewed with patient? YES  SHORT TERM GOALS: Target date: 11/08/23 Patient will report comprehension, confidence, and consistent compliance and of a simple home exercise program established to facilitate symptoms management and basic strengthening and/or segment mobility.    Baseline: NT 10/15/23: Performing exercises independently    Status: ACHIEVED    Patient to demonstrate improved score on LEFS by >9% to indicate reduced self-reported disability and/or pain.     Baseline: NOT MET  Status: 19/80 or 24%   11/11/23: 20/80 or  25%    LONG TERM GOALS: Target date: 12/09/23  1. Patient to improve score on LEFS by 20% or greater to indicate reduced disability and improved quality  of life.    Baseline: 19/80 (24%) 11/11/23: 20/80 or 25%   Status: ONGOING    2. Pt to report no episodes of knee buckling in most recent 2 weeks to indicate improved stability ogf knee joint loading in ADL performance.   Baseline: twice weekly episodes  Status: ONGOING    3. Pt to report worse pain rating in most recent 2 weeks no greater than 6/10 to indiccate greater stability of pain symptoms with daily actiivty/mobility.   Baseline: 8/10 worst pain  Status: ONGOING   PLAN:  PT FREQUENCY: 1-2x/week PT DURATION: 8 weeks PLANNED INTERVENTIONS: 97164- PT Re-evaluation, 97110-Therapeutic exercises, 97530- Therapeutic activity, [id number]- Neuromuscular re-education, 97535- Self Care, 02859- Manual therapy, [phone]- Gait training, [phone]- Orthotic Fit/training, [phone]- Prosthetic training, [phone]- Aquatic Therapy, [phone]- Electrical stimulation (unattended), [phone]- Electrical stimulation (manual), [id number]- Traction (mechanical), Patient/Family education, Dry Needling, Joint mobilization, Joint manipulation, Spinal manipulation, Spinal mobilization, Cryotherapy, and Moist heat  PLAN FOR NEXT SESSION: Reassess goals 2 and 3 from long term goals. Focus on mini-squats with banded for ER. Overground ambulation with standing exercises between bouts of walking: standing marches or hip hikes, mini-squats, etc.    Toribio Servant PT, DPT  [facility] [address] [place], [address] Phone: [phone]   Fax:  [phone]

## 2023-11-18 ENCOUNTER — Encounter: Payer: Self-pay | Admitting: Physical Therapy

## 2023-11-18 ENCOUNTER — Other Ambulatory Visit

## 2023-11-18 ENCOUNTER — Ambulatory Visit: Admitting: Physical Therapy

## 2023-11-18 DIAGNOSIS — R262 Difficulty in walking, not elsewhere classified: Secondary | ICD-10-CM | POA: Diagnosis not present

## 2023-11-18 DIAGNOSIS — M25661 Stiffness of right knee, not elsewhere classified: Secondary | ICD-10-CM | POA: Diagnosis not present

## 2023-11-18 DIAGNOSIS — M25561 Pain in right knee: Secondary | ICD-10-CM | POA: Diagnosis not present

## 2023-11-18 DIAGNOSIS — G8929 Other chronic pain: Secondary | ICD-10-CM | POA: Diagnosis not present

## 2023-11-18 NOTE — Therapy (Signed)
 OUTPATIENT PHYSICAL THERAPY TREATMENT      Patient Name: Traci Davis MRN: [id number] DOB:1970/06/13, 53 y.o., female Today's Date: 11/18/2023  END OF SESSION:  PT End of Session - 11/18/23 1627     Visit Number 8    Number of Visits 20    Date for PT Re-Evaluation 12/04/23    Authorization Type UHC Medicare Dual (VL based on mn)    Authorization - Visit Number 8    Authorization - Number of Visits 20    Progress Note Due on Visit 10    PT Start Time 1545    PT Stop Time 1630    PT Time Calculation (min) 45 min    Activity Tolerance Patient tolerated treatment well    Behavior During Therapy [facility] for tasks assessed/performed              Past Medical History:  Diagnosis Date   Acute pancreatitis 09/04/2014   Anxiety    Arthritis    Asthma    Cerebral palsy (HCC)    Complication of anesthesia    panic attacks before surgery   COPD (chronic obstructive pulmonary disease) (HCC)    Depression    Diabetes mellitus without complication (HCC)    GERD (gastroesophageal reflux disease)    Hypertension    Noninfectious gastroenteritis and colitis 01/10/2013   Past Surgical History:  Procedure Laterality Date   CESAREAN SECTION  1995   CHOLECYSTECTOMY     DILATATION & CURETTAGE/HYSTEROSCOPY WITH MYOSURE N/A 02/20/2018   Procedure: DILATATION & CURETTAGE/HYSTEROSCOPY WITH MYOSURE ENDOMETRIAL POLYPECTOMY;  Surgeon: Leonce Garnette BIRCH, MD;  Location: [facility];  Service: Gynecology;  Laterality: N/A;   ESOPHAGOGASTRODUODENOSCOPY (EGD) WITH PROPOFOL  N/A 01/01/2019   Procedure: ESOPHAGOGASTRODUODENOSCOPY (EGD) WITH PROPOFOL ;  Surgeon: Unk Corinn Skiff, MD;  Location: [facility];  Service: Gastroenterology;  Laterality: N/A;   FOOT CAPSULE RELEASE W/ PERCUTANEOUS HEEL CORD LENGTHENING, TIBIAL TENDON TRANSFER     age 1 ,and 2010-both legs   JOINT REPLACEMENT Right    both knees partial replacements dates unknown   knee replacment     x 5 per pt. last one- left knee  2014. has had 2 on R 3 on L   TYMPANOPLASTY WITH GRAFT Right 01/08/2018   Procedure: TYMPANOPLASTY WITH POSSIBLE OSSICULAR CHAIN RECONSTRUCTION;  Surgeon: Blair Mt, MD;  Location: [facility];  Service: ENT;  Laterality: Right;   Patient Active Problem List   Diagnosis Date Noted   Urge urinary incontinence 04/02/2023   Incontinence of feces 04/02/2023   Vaginal atrophy 04/02/2023   Asthma, persistent controlled 02/06/2023   Continuous leakage of urine 01/04/2023   De Quervain's tenosynovitis, right 10/08/2022   Hot flashes due to menopause 07/17/2022   Convulsions (HCC) 07/09/2022   Obesity due to excess calories 09/22/2020   Insomnia due to medical condition 05/11/2020   Hamstring tendonitis 01/29/2020   MDD (major depressive disorder), recurrent, in full remission (HCC) 08/12/2019   Seizure-like activity (HCC) 07/08/2019   Mild episode of recurrent major depressive disorder (HCC) 12/29/2018   Insomnia due to mental condition 12/29/2018   Disorder of vein 03/04/2018   Lumbar sprain 03/04/2018   Muscle weakness 03/04/2018   Neck sprain 03/04/2018   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   Postmenopausal bleeding 02/18/2018   Impingement syndrome of shoulder region 02/04/2018   Chest pain with moderate risk for cardiac etiology 05/19/2017   GAD (generalized anxiety disorder) 04/15/2017   Chronic daily headache 04/15/2017   Panic attack 04/15/2017   Nocturnal  enuresis 04/15/2017   Mild cognitive impairment 04/15/2017   Loss of memory 01/14/2017   Pain medication agreement signed 01/08/2017   Headache disorder 12/04/2016   Chronic, continuous use of opioids 07/01/2015   Vertigo 07/01/2015   Chronic midline low back pain without sciatica 03/21/2015   Type 2 diabetes mellitus with other specified complication (HCC) 09/05/2014   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   Hip pain, chronic 04/28/2013   Essential hypertension 01/10/2013   Irritable colon 01/10/2013   Encounter  for long-term (current) use of other medications 12/04/2012   Chronic GERD 08/12/2012   Diarrhea 08/12/2012   Primary localized osteoarthrosis, lower leg 05/07/2012   Difficulty walking 02/06/2012    PCP: Dr. Marsa Officer  REFERRING PROVIDER: Dr. Suzen Brantley Salters  REFERRING DIAG: Cervicalgia  THERAPY DIAG:  No diagnosis found.  Rationale for Evaluation and Treatment: Rehabilitation ONSET DATE: Jan 2025  SUBJECTIVE:                                                                                                                                                                                                        SUBJECTIVE STATEMENT: Pt reports that her knee is not giving out as much but she is feeling arthritis all over her body, which is causing her a lot of pain. She feels most of her pain in the outside of right knee.     PERTINENT HISTORY:  Pt reports insidious onset of Rt knee pain and twice weekly buckling episodes starting ~Jan 2025, has constant pain on the femur lateral to the patella. History of remote unicompartmental knee arthroplasty. Pt has not fallen due to this, but she is very concerned about a fall in the future due to her baseline mobility limitations. At eval pain is reported as 6/10 current, 4/10 best in past week, 8/10 worst in past week. Pt manages her pain at home with heat, ice, tylenol  arthritis, and/or percocet.   PAIN:  Anterior Rt knee pain, lateral to the patella, reports pain all the time, but worse after buckling episodes.    PRECAUTIONS: None WEIGHT BEARING RESTRICTIONS: No FALLS:  Has patient fallen in last 6 months? No  OCCUPATION: On disability  PLOF: Independent  PATIENT GOALS: Decrease pain, avoid falling, abolish buckling episodes.   OBJECTIVE:  PATIENT SURVEYS:  LEFS: to be issued on visit #2  ROM:  -Rt knee flexion: 105 degrees  -Rt knee extension: 24 degrees   STRENGTH ASSESSMENT:  -Rt knee extension: 5/5  -Rt hip  extension (straight knee, tested in supine, 30 degrees flexion): 3+/5  TODAY'S TREATMENT   11/18/23:  THEREX   Lumbar Flexion AROM with silver  ball flexion center  2 x 25   Sit to Stand from 26 inch mat height 1 x 5  -Pt reports increased pain in lateral side of right knee  Mini-Squat with BUE support with yellow band resistance with ER 2 x 10   Overground ambulation 50 ft x 3  Standing Marches on RLE with BUE support 2 x 10  Overground ambulation  50 ft x 2 Hip Hikes 2 x 10 with 5 sec hold      PATIENT EDUCATION:  Education details: Explained how lacking 24 degrees of full extension can increase resting patella loading and increase risk of buckling.   Person educated: Patient Education method: collaborative learning, deliberate practice, positive reinforcement, explicit instruction, establish rules. Education comprehension: verbalized understanding, returned demonstration, verbal cues required, and tactile cues required  HOME EXERCISE PROGRAM: Access Code: [email or web address] URL: [email or web address] Date: 11/13/2023 Prepared by: Toribio Servant  Program Notes Seated Towel Slides 3 x 30 x 7 days per week   Exercises - Modified Thomas Stretch  - 1 x daily - 7 x weekly - 3 reps - 60 secsec hold - Seated Hamstring Stretch  - 1 x daily - 7 x weekly - 3 reps - 60 sec hold - Active Straight Leg Raise with Quad Set  - 3-4 x weekly - 2 sets - 10 reps - Standing Hip Hiking  - 3-4 x weekly - 2 sets - 10 reps - 5 sec hold - Seated March  - 3-4 x weekly - 2 sets - 10 reps - Standing March with Counter Support  - 3-4 x weekly - 2 sets - 10 reps  ASSESSMENT: CLINICAL IMPRESSION: Pt demonstrates an improvement in swing leg advancement especially on left leg with increased foot clearance. She is still unable to flex right hip as much due to instability of left knee and needing to lock knee brace into extension. Pt continues to be highly motivated and she is able to complete exercises  without an increase in her knee pain. She will continue to benefit from PT to reduced falls risk, improve walkign tolerance, reduce pain, and reduce caregiver burden.     OBJECTIVE IMPAIRMENTS: decreased ROM, decreased strength, impaired sensation, impaired UE functional use, and pain.   ACTIVITY LIMITATIONS: carrying, lifting, bending, bathing, dressing, reach over head, and hygiene/grooming PARTICIPATION LIMITATIONS: meal prep, cleaning, laundry, shopping, and community activity PERSONAL FACTORS: Age, Education, Sex, Social background, Time since onset of injury/illness/exacerbation, Transportation, and 3+ comorbidities: Cerebral Palsy, HTN, T2DM are also affecting patient's functional outcome.  REHAB POTENTIAL: Fair (timeline since knee surgery) CLINICAL DECISION MAKING: Medium  EVALUATION COMPLEXITY: complicated    GOALS: Goals reviewed with patient? YES  SHORT TERM GOALS: Target date: 11/08/23 Patient will report comprehension, confidence, and consistent compliance and of a simple home exercise program established to facilitate symptoms management and basic strengthening and/or segment mobility.    Baseline: NT 10/15/23: Performing exercises independently    Status: ACHIEVED    Patient to demonstrate improved score on LEFS by >9% to indicate reduced self-reported disability and/or pain.     Baseline: NOT MET     Status: 19/80 or 24%   11/11/23: 20/80 or  25%    LONG TERM GOALS: Target date: 12/09/23  1. Patient to improve score on LEFS by 20% or greater to indicate reduced disability and improved quality of life.    Baseline: 19/80 (24%) 11/11/23: 20/80 or 25%  Status: ONGOING    2. Pt to report no episodes of knee buckling in most recent 2 weeks to indicate improved stability ogf knee joint loading in ADL performance.   Baseline: twice weekly episodes  11/18/23: Not giving out but painful.  Status: ACHIEVED     3. Pt to report worse pain rating in most recent 2 weeks no greater  than 6/10 to indiccate greater stability of pain symptoms with daily actiivty/mobility.   Baseline: 8/10 worst pain  Status: ONGOING   PLAN:  PT FREQUENCY: 1-2x/week PT DURATION: 8 weeks PLANNED INTERVENTIONS: 97164- PT Re-evaluation, 97110-Therapeutic exercises, 97530- Therapeutic activity, 97112- Neuromuscular re-education, 97535- Self Care, 02859- Manual therapy, [phone]- Gait training, [phone]- Orthotic Fit/training, [phone]- Prosthetic training, [phone]- Aquatic Therapy, [phone]- Electrical stimulation (unattended), [phone]- Electrical stimulation (manual), [id number]- Traction (mechanical), Patient/Family education, Dry Needling, Joint mobilization, Joint manipulation, Spinal manipulation, Spinal mobilization, Cryotherapy, and Moist heat  PLAN FOR NEXT SESSION: Overground ambulation with standing exercises between bouts of walking: standing marches or hip hikes, mini-squats, etc.    Toribio Servant PT, DPT  [facility] [address], [place], [address] Phone: [phone]   Fax:  [phone]

## 2023-11-20 ENCOUNTER — Ambulatory Visit: Admitting: Physical Therapy

## 2023-11-20 DIAGNOSIS — M25561 Pain in right knee: Secondary | ICD-10-CM | POA: Diagnosis not present

## 2023-11-20 DIAGNOSIS — M25661 Stiffness of right knee, not elsewhere classified: Secondary | ICD-10-CM | POA: Diagnosis not present

## 2023-11-20 DIAGNOSIS — R262 Difficulty in walking, not elsewhere classified: Secondary | ICD-10-CM

## 2023-11-20 DIAGNOSIS — G8929 Other chronic pain: Secondary | ICD-10-CM | POA: Diagnosis not present

## 2023-11-20 NOTE — Therapy (Signed)
 OUTPATIENT PHYSICAL THERAPY TREATMENT      Patient Name: Traci Davis MRN: [id number] DOB:1971-02-06, 53 y.o., female Today's Date: 11/20/2023  END OF SESSION:  PT End of Session - 11/20/23 1616     Visit Number 9    Number of Visits 20    Date for PT Re-Evaluation 12/04/23    Authorization Type UHC Medicare Dual (VL based on mn)    Authorization - Visit Number 9    Authorization - Number of Visits 20    Progress Note Due on Visit 10    PT Start Time 1605    PT Stop Time 1645    PT Time Calculation (min) 40 min    Activity Tolerance Patient tolerated treatment well    Behavior During Therapy [facility] for tasks assessed/performed              Past Medical History:  Diagnosis Date   Acute pancreatitis 09/04/2014   Anxiety    Arthritis    Asthma    Cerebral palsy (HCC)    Complication of anesthesia    panic attacks before surgery   COPD (chronic obstructive pulmonary disease) (HCC)    Depression    Diabetes mellitus without complication (HCC)    GERD (gastroesophageal reflux disease)    Hypertension    Noninfectious gastroenteritis and colitis 01/10/2013   Past Surgical History:  Procedure Laterality Date   CESAREAN SECTION  1995   CHOLECYSTECTOMY     DILATATION & CURETTAGE/HYSTEROSCOPY WITH MYOSURE N/A 02/20/2018   Procedure: DILATATION & CURETTAGE/HYSTEROSCOPY WITH MYOSURE ENDOMETRIAL POLYPECTOMY;  Surgeon: Leonce Garnette BIRCH, MD;  Location: [facility];  Service: Gynecology;  Laterality: N/A;   ESOPHAGOGASTRODUODENOSCOPY (EGD) WITH PROPOFOL  N/A 01/01/2019   Procedure: ESOPHAGOGASTRODUODENOSCOPY (EGD) WITH PROPOFOL ;  Surgeon: Unk Corinn Skiff, MD;  Location: [facility];  Service: Gastroenterology;  Laterality: N/A;   FOOT CAPSULE RELEASE W/ PERCUTANEOUS HEEL CORD LENGTHENING, TIBIAL TENDON TRANSFER     age 24 ,and 2010-both legs   JOINT REPLACEMENT Right    both knees partial replacements dates unknown   knee replacment     x 5 per pt. last one- left knee  2014. has had 2 on R 3 on L   TYMPANOPLASTY WITH GRAFT Right 01/08/2018   Procedure: TYMPANOPLASTY WITH POSSIBLE OSSICULAR CHAIN RECONSTRUCTION;  Surgeon: Blair Mt, MD;  Location: [facility];  Service: ENT;  Laterality: Right;   Patient Active Problem List   Diagnosis Date Noted   Urge urinary incontinence 04/02/2023   Incontinence of feces 04/02/2023   Vaginal atrophy 04/02/2023   Asthma, persistent controlled 02/06/2023   Continuous leakage of urine 01/04/2023   De Quervain's tenosynovitis, right 10/08/2022   Hot flashes due to menopause 07/17/2022   Convulsions (HCC) 07/09/2022   Obesity due to excess calories 09/22/2020   Insomnia due to medical condition 05/11/2020   Hamstring tendonitis 01/29/2020   MDD (major depressive disorder), recurrent, in full remission (HCC) 08/12/2019   Seizure-like activity (HCC) 07/08/2019   Mild episode of recurrent major depressive disorder (HCC) 12/29/2018   Insomnia due to mental condition 12/29/2018   Disorder of vein 03/04/2018   Lumbar sprain 03/04/2018   Muscle weakness 03/04/2018   Neck sprain 03/04/2018   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   Postmenopausal bleeding 02/18/2018   Impingement syndrome of shoulder region 02/04/2018   Chest pain with moderate risk for cardiac etiology 05/19/2017   GAD (generalized anxiety disorder) 04/15/2017   Chronic daily headache 04/15/2017   Panic attack 04/15/2017   Nocturnal  enuresis 04/15/2017   Mild cognitive impairment 04/15/2017   Loss of memory 01/14/2017   Pain medication agreement signed 01/08/2017   Headache disorder 12/04/2016   Chronic, continuous use of opioids 07/01/2015   Vertigo 07/01/2015   Chronic midline low back pain without sciatica 03/21/2015   Type 2 diabetes mellitus with other specified complication (HCC) 09/05/2014   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   Hip pain, chronic 04/28/2013   Essential hypertension 01/10/2013   Irritable colon 01/10/2013   Encounter  for long-term (current) use of other medications 12/04/2012   Chronic GERD 08/12/2012   Diarrhea 08/12/2012   Primary localized osteoarthrosis, lower leg 05/07/2012   Difficulty walking 02/06/2012    PCP: Dr. Marsa Officer  REFERRING PROVIDER: Dr. Suzen Brantley Salters  REFERRING DIAG: Cervicalgia  THERAPY DIAG:  Difficulty in walking, not elsewhere classified  Stiffness of right knee, not elsewhere classified  Rationale for Evaluation and Treatment: Rehabilitation ONSET DATE: Jan 2025  SUBJECTIVE:                                                                                                                                                                                                        SUBJECTIVE STATEMENT: Pt states that she felt a pulling on the side of her hip when trying to turn over in bed last night.    PERTINENT HISTORY:  Pt reports insidious onset of Rt knee pain and twice weekly buckling episodes starting ~Jan 2025, has constant pain on the femur lateral to the patella. History of remote unicompartmental knee arthroplasty. Pt has not fallen due to this, but she is very concerned about a fall in the future due to her baseline mobility limitations. At eval pain is reported as 6/10 current, 4/10 best in past week, 8/10 worst in past week. Pt manages her pain at home with heat, ice, tylenol  arthritis, and/or percocet.   PAIN:  Anterior Rt knee pain, lateral to the patella, reports pain all the time, but worse after buckling episodes.    PRECAUTIONS: None WEIGHT BEARING RESTRICTIONS: No FALLS:  Has patient fallen in last 6 months? No  OCCUPATION: On disability  PLOF: Independent  PATIENT GOALS: Decrease pain, avoid falling, abolish buckling episodes.   OBJECTIVE:  PATIENT SURVEYS:  LEFS: to be issued on visit #2  ROM:  -Rt knee flexion: 105 degrees  -Rt knee extension: 24 degrees   STRENGTH ASSESSMENT:  -Rt knee extension: 5/5  -Rt hip extension  (straight knee, tested in supine, 30 degrees flexion): 3+/5  TODAY'S TREATMENT   11/20/23: JERRE  Lumbar Flexion AROM with silver  ball flexion center  2 x 25   Seated Marches 2 x 10  Seated QL Stretch 4 x 60 sec    Sit to Stands with BUE support 3 x 10   -min VC to decrease speed of eccentric phase   Long Arc Quads 2 x 10 with #3 AW on RLE    MANUAL: PT provided overpressure   Supine HS Stretch 4 x 60 sec  Supine Hip Adductor Stretch 4 x 60 sec   Supine Hip Flexor Stretch 4 x 60 sec       PATIENT EDUCATION:  Education details: Explained how lacking 24 degrees of full extension can increase resting patella loading and increase risk of buckling.   Person educated: Patient Education method: collaborative learning, deliberate practice, positive reinforcement, explicit instruction, establish rules. Education comprehension: verbalized understanding, returned demonstration, verbal cues required, and tactile cues required  HOME EXERCISE PROGRAM: Access Code: [email or web address] URL: [email or web address] Date: 11/20/2023 Prepared by: Toribio Servant  Program Notes Seated Towel Slides 3 x 30 x 7 days per week   Exercises - Modified Thomas Stretch  - 1 x daily - 7 x weekly - 3 reps - 60 secsec hold - Seated Hamstring Stretch  - 1 x daily - 7 x weekly - 3 reps - 60 sec hold - Seated Quadratus Lumborum Stretch in Chair  - 1 x daily - 7 x weekly - 3 reps - 30-60 sec hold - Active Straight Leg Raise with Quad Set  - 3-4 x weekly - 2 sets - 10 reps - Standing Hip Hiking  - 3-4 x weekly - 2 sets - 10 reps - 5 sec hold - Standing March with Counter Support  - 3-4 x weekly - 2 sets - 10 reps - Sit to Stand  - 3-4 x weekly - 3 sets - 8-10 reps - Squat with Chair Touch and Resistance Loop  - 3-4 x weekly - 2 sets - 10 reps   ASSESSMENT: CLINICAL IMPRESSION: Pt exhibits overall improvement in activity tolerance with ability to perform sit to stands without being limited by right knee  pain. She did have increased tension in right QL that was relieved by stretch and that is likely from hip hiking exercise. She will continue to benefit from PT to reduced falls risk, improve walkign tolerance, reduce pain, and reduce caregiver burden.    OBJECTIVE IMPAIRMENTS: decreased ROM, decreased strength, impaired sensation, impaired UE functional use, and pain.   ACTIVITY LIMITATIONS: carrying, lifting, bending, bathing, dressing, reach over head, and hygiene/grooming PARTICIPATION LIMITATIONS: meal prep, cleaning, laundry, shopping, and community activity PERSONAL FACTORS: Age, Education, Sex, Social background, Time since onset of injury/illness/exacerbation, Transportation, and 3+ comorbidities: Cerebral Palsy, HTN, T2DM are also affecting patient's functional outcome.  REHAB POTENTIAL: Fair (timeline since knee surgery) CLINICAL DECISION MAKING: Medium  EVALUATION COMPLEXITY: complicated    GOALS: Goals reviewed with patient? YES  SHORT TERM GOALS: Target date: 11/08/23 Patient will report comprehension, confidence, and consistent compliance and of a simple home exercise program established to facilitate symptoms management and basic strengthening and/or segment mobility.    Baseline: NT 10/15/23: Performing exercises independently    Status: ACHIEVED    Patient to demonstrate improved score on LEFS by >9% to indicate reduced self-reported disability and/or pain.     Baseline: NOT MET     Status: 19/80 or 24%   11/11/23: 20/80 or  25%    LONG TERM GOALS: Target date: 12/09/23  1. Patient  to improve score on LEFS by 20% or greater to indicate reduced disability and improved quality of life.    Baseline: 19/80 (24%) 11/11/23: 20/80 or 25%   Status: ONGOING    2. Pt to report no episodes of knee buckling in most recent 2 weeks to indicate improved stability ogf knee joint loading in ADL performance.   Baseline: twice weekly episodes  11/18/23: Not giving out but painful.  Status:  ACHIEVED     3. Pt to report worse pain rating in most recent 2 weeks no greater than 6/10 to indiccate greater stability of pain symptoms with daily actiivty/mobility.   Baseline: 8/10 worst pain  Status: ONGOING   PLAN:  PT FREQUENCY: 1-2x/week PT DURATION: 8 weeks PLANNED INTERVENTIONS: 97164- PT Re-evaluation, 97110-Therapeutic exercises, 97530- Therapeutic activity, 97112- Neuromuscular re-education, 97535- Self Care, 02859- Manual therapy, [phone]- Gait training, [phone]- Orthotic Fit/training, [phone]- Prosthetic training, [phone]- Aquatic Therapy, [phone]- Electrical stimulation (unattended), [phone]- Electrical stimulation (manual), [id number]- Traction (mechanical), Patient/Family education, Dry Needling, Joint mobilization, Joint manipulation, Spinal manipulation, Spinal mobilization, Cryotherapy, and Moist heat  PLAN FOR NEXT SESSION: Reassess goals. Overground ambulation with standing exercises between bouts of walking: standing marches or hip hikes, mini-squats, etc.    Toribio Servant PT, DPT  [facility] [address], [place], [address] Phone: [phone]   Fax:  [phone]

## 2023-11-25 ENCOUNTER — Ambulatory Visit: Admitting: Physical Therapy

## 2023-11-25 ENCOUNTER — Other Ambulatory Visit (INDEPENDENT_AMBULATORY_CARE_PROVIDER_SITE_OTHER): Admitting: Pharmacist

## 2023-11-25 DIAGNOSIS — Z794 Long term (current) use of insulin: Secondary | ICD-10-CM

## 2023-11-25 DIAGNOSIS — G8929 Other chronic pain: Secondary | ICD-10-CM | POA: Diagnosis not present

## 2023-11-25 DIAGNOSIS — E1169 Type 2 diabetes mellitus with other specified complication: Secondary | ICD-10-CM

## 2023-11-25 DIAGNOSIS — M25561 Pain in right knee: Secondary | ICD-10-CM | POA: Diagnosis not present

## 2023-11-25 DIAGNOSIS — M25661 Stiffness of right knee, not elsewhere classified: Secondary | ICD-10-CM

## 2023-11-25 DIAGNOSIS — R262 Difficulty in walking, not elsewhere classified: Secondary | ICD-10-CM

## 2023-11-25 NOTE — Patient Instructions (Signed)
 Goals Addressed             This Visit's Progress    Pharmacy Goals       The goal A1c is less than 7%. This is the best way to reduce the risk of the long term complications of diabetes, including heart disease, kidney disease, eye disease, strokes, and nerve damage. An A1c of less than 7% corresponds with fasting sugars less than 130 and 2 hour after meal sugars less than 180.   Our goal bad cholesterol, or LDL, is less than 70 . This is why it is important to continue taking your atorvastatin.  Arthur Lash, PharmD, [facility] Clinical Pharmacist [facility] [phone]

## 2023-11-25 NOTE — Progress Notes (Signed)
 11/25/2023 Name: Traci Davis MRN: [id number] DOB: Mar 17, 1971  Chief Complaint  Patient presents with   Medication Management    Traci Davis is a 53 y.o. year old female who presented for a telephone visit.   They were referred to the pharmacist by their PCP for assistance in managing complex medication management.      Subjective:   Care Team: Primary Care Provider: Edman Marsa PARAS, DO Neurology: Lane Arthea Locus, MD; Next Scheduled Visit: 01/14/2024 Pain Management: Orlando Gladies Goldsmith, MD  Psychiatrist: Eappen, Saramma, MD; Next Scheduled Visit: 01/13/2024 GI Specialist: Unk Corinn Skiff, MD  Pulmonologist: [facility] Endocrinologist: Damian Therisa Setter, MD; Next Schedule Visit: 12/23/2023  Medication Access/Adherence  Current Pharmacy:  [facility] [place], [place] - [address] [place] [address] Phone: [phone] Fax: [phone]   Patient reports affordability concerns with their medications: No  Patient reports access/transportation concerns to their pharmacy: No  Patient reports adherence concerns with their medications:  No     Using weekly pillbox as filled by her daughter   From review of chart, note patient seen for Initial Visit with Endocrinologist on 11/07/2023, Provider advised: - Keep taking the Lantus  42 units once a day  - Return to office in 6 weeks. Depending on your blood sugar patterns, may start a fast-acting meal time insulin     Diabetes:  Patient followed by [facility] Endocrinology   Current medications:  - metformin  ER 500 - 2 tablets twice daily - Lantus  42 units daily - Reports increased following direction from PCP   Medications tried in the past/contraindications: Jardiance  (yeast infection); history of pancreatitis    Patient using Dexcom G7 continuous glucose monitor Current glucose readings: recent readings ranging 150-275   Reports met with [facility] Dietitian and following recommendations    Patient denies hypoglycemic s/sx including dizziness, shakiness, sweating.   Current physical activity: Doing neck exercises and exercises from physical therapy    Statin therapy: atorvastatin  20 mg daily   Objective:  Lab Results  Component Value Date   HGBA1C 9.6 (H) 09/04/2023    Lab Results  Component Value Date   CREATININE 0.88 05/31/2023   BUN 12 05/31/2023   NA 139 05/31/2023   K 3.9 05/31/2023   CL 98 05/31/2023   CO2 27 05/31/2023    Lab Results  Component Value Date   CHOL 226 (H) 09/04/2023   HDL 60 09/04/2023   LDLCALC 132 (H) 09/04/2023   TRIG 198 (H) 09/04/2023   CHOLHDL 3.8 09/04/2023   Current Outpatient Medications on File Prior to Visit  Medication Sig Dispense Refill   insulin  glargine (LANTUS ) 100 UNIT/ML Solostar Pen Inject 34 Units into the skin daily. (Patient taking differently: Inject 42 Units into the skin daily.) 30 mL 0   metFORMIN  (GLUCOPHAGE -XR) 500 MG 24 hr tablet Take 2 tablets (1,000 mg total) by mouth 2 (two) times daily. 360 tablet 3   acetaminophen  (TYLENOL ) 325 MG tablet Take 2 tablets (650 mg total) by mouth every 6 (six) hours as needed. 60 tablet 0   albuterol  (PROVENTIL  HFA;VENTOLIN  HFA) 108 (90 BASE) MCG/ACT inhaler Inhale 2 puffs into the lungs 4 (four) times daily as needed. For shortness of breath and/or wheezing     amLODipine  (NORVASC ) 5 MG tablet Take 1 tablet (5 mg total) by mouth daily. 90 tablet 3   atorvastatin  (LIPITOR) 20 MG tablet Take 1 tablet (20 mg total) by mouth daily.  90 tablet 3   azelastine (ASTELIN) 0.1 % nasal spray Place into both nostrils.     Blood Glucose Monitoring Suppl (GLUCOCOM BLOOD GLUCOSE MONITOR) DEVI Test daily before all meals/snacks and once before bedtime.     budesonide-formoterol (SYMBICORT) 80-4.5 MCG/ACT inhaler Inhale 2 puffs up to 4 times daily as needed for symptoms of wheezing or shortness of breath.     buprenorphine (BUTRANS) 15  MCG/HR 1 patch once a week.     busPIRone  (BUSPAR ) 10 MG tablet Take 1 tablet (10 mg total) by mouth 3 (three) times daily. 270 tablet 1   Continuous Glucose Sensor (DEXCOM G7 SENSOR) MISC Use 1 sensor every 10 days for continuous glucose monitoring 9 each 3   CREON  36000-114000 units CPEP capsule Take 3 capsules (108,000 Units total) by mouth 3 (three) times daily with meals. 300 capsule 5   dicyclomine  (BENTYL ) 10 MG capsule Take 2 capsules in morning with meal and 1 capsule in evening with meal. 90 capsule 5   donepezil (ARICEPT) 10 MG tablet Take 10 mg by mouth at bedtime.     estradiol (ESTRACE) 0.1 MG/GM vaginal cream Place 1 Applicatorful vaginally 3 (three) times a week.     FLUoxetine  (PROZAC ) 20 MG capsule Take 1 capsule (20 mg total) by mouth daily. Take along with 40 mg daily - total of 60 mg daily 90 capsule 1   FLUoxetine  (PROZAC ) 40 MG capsule TAKE 1 CAPSULE BY MOUTH ONCE DAILY . TAKE ALONG WITH 20MG  DAILY AS DIRECTED 90 capsule 1   furosemide  (LASIX ) 40 MG tablet Take 1 tablet (40 mg total) by mouth daily as needed for fluid. 30 tablet 5   gabapentin (NEURONTIN) 300 MG capsule Take 600 mg by mouth 3 (three) times daily.     glucose blood test strip USE TO TEST BLOOD SUGAR TWO TIMES A DAY     GVOKE HYPOPEN  2-PACK 1 MG/0.2ML SOAJ Inject 1 mg into the skin as needed (hypoglycemia). 1 mL 0   incobotulinumtoxinA (XEOMIN) 100 units SOLR injection Inject 100 Units into the muscle every 3 (three) months.      Incontinence Supply Disposable (PREVAIL BLADDER CONTROL PAD) MISC Large incontinence pads to go into underwear     Insulin  Pen Needle (GLOBAL EASE INJECT PEN NEEDLES) 31G X 5 MM MISC Use pen needle to inject insulin  daily. 100 each 0   lamoTRIgine  (LAMICTAL ) 25 MG tablet Take 1 tablet (25 mg total) by mouth daily. 90 tablet 1   Lancets (FREESTYLE) lancets Test daily before all meals/snacks     lidocaine  (LIDODERM ) 5 % Place 2 patches onto the skin daily.     linaclotide (LINZESS) 72  MCG capsule Take 72 mcg by mouth daily before breakfast.     lisinopril  (ZESTRIL ) 20 MG tablet Take 1 tablet (20 mg total) by mouth daily. 90 tablet 3   methocarbamol  (ROBAXIN ) 500 MG tablet Take 1 tablet (500 mg total) by mouth 3 (three) times daily as needed for muscle spasms. 90 tablet 5   mirabegron  ER (MYRBETRIQ ) 50 MG TB24 tablet Take 1 tablet (50 mg total) by mouth daily. 30 tablet 3   mirtazapine  (REMERON ) 15 MG tablet Take 1.5 tablets (22.5 mg total) by mouth at bedtime. 135 tablet 1   montelukast  (SINGULAIR ) 10 MG tablet Take 1 tablet (10 mg total) by mouth at bedtime. 90 tablet 3   naloxone (NARCAN) nasal spray 4 mg/0.1 mL      nystatin (MYCOSTATIN/NYSTOP) powder Apply topically.  omeprazole  (PRILOSEC) 40 MG capsule Take 1 capsule (40 mg total) by mouth in the morning and at bedtime. 90 capsule 3   ondansetron  (ZOFRAN -ODT) 4 MG disintegrating tablet Take 1 tablet (4 mg total) by mouth every 8 (eight) hours as needed for nausea or vomiting. 30 tablet 2   oxyCODONE -acetaminophen  (PERCOCET) 10-325 MG tablet Take 1 tablet by mouth 3 (three) times daily as needed.     SUMAtriptan (IMITREX) 100 MG tablet Take 100 mg by mouth as directed. As needed for migraines     triamcinolone cream (KENALOG) 0.1 % Apply 1 Application topically 3 (three) times daily as needed.     XTAMPZA  ER 13.5 MG C12A Take 1 capsule by mouth 2 (two) times daily.     No current facility-administered medications on file prior to visit.       Assessment/Plan:   Diabetes: - Currently uncontrolled - Reviewed long term cardiovascular and renal outcomes of uncontrolled blood sugar - Reviewed goal A1c, goal fasting, and goal 2 hour post prandial glucose - Encourage patient to follow dietary recommendations from Dietitian. Have reviewed dietary modifications including importance of having regular well-balanced meals and snacks throughout the day, while controlling carbohydrate portion sizes - Recommend to continue to  use Dexcom G7 CGM to monitor blood sugar/as feedback on dietary choices Patient to check glucose with fingerstick check when needed for symptoms and as back up to CGM. Patient to contact office if needed for readings outside of established parameters or symptoms   Follow Up Plan: Clinical Pharmacist will follow up with patient by telephone on 01/20/2024 at 1:00 PM   Sharyle Sia, PharmD, [facility] [phone]

## 2023-11-25 NOTE — Therapy (Signed)
 OUTPATIENT PHYSICAL THERAPY PROGRESS    Dates of Reporting: 10/09/23-11/25/23     Patient Name: Traci Davis MRN: [id number] DOB:01/09/1971, 53 y.o., female Today's Date: 11/25/2023  END OF SESSION:  PT End of Session - 11/25/23 1611     Visit Number 10    Number of Visits 20    Date for PT Re-Evaluation 12/04/23    Authorization Type [employer] Medicare Dual (VL based on mn)    Authorization - Visit Number 10    Authorization - Number of Visits 20    Progress Note Due on Visit 10    PT Start Time 1605    PT Stop Time 1645    PT Time Calculation (min) 40 min    Activity Tolerance Patient tolerated treatment well    Behavior During Therapy [facility] for tasks assessed/performed              Past Medical History:  Diagnosis Date   Acute pancreatitis 09/04/2014   Anxiety    Arthritis    Asthma    Cerebral palsy (HCC)    Complication of anesthesia    panic attacks before surgery   COPD (chronic obstructive pulmonary disease) (HCC)    Depression    Diabetes mellitus without complication (HCC)    GERD (gastroesophageal reflux disease)    Hypertension    Noninfectious gastroenteritis and colitis 01/10/2013   Past Surgical History:  Procedure Laterality Date   CESAREAN SECTION  1995   CHOLECYSTECTOMY     DILATATION & CURETTAGE/HYSTEROSCOPY WITH MYOSURE N/A 02/20/2018   Procedure: DILATATION & CURETTAGE/HYSTEROSCOPY WITH MYOSURE ENDOMETRIAL POLYPECTOMY;  Surgeon: Leonce Garnette BIRCH, MD;  Location: [facility] ORS;  Service: Gynecology;  Laterality: N/A;   ESOPHAGOGASTRODUODENOSCOPY (EGD) WITH PROPOFOL  N/A 01/01/2019   Procedure: ESOPHAGOGASTRODUODENOSCOPY (EGD) WITH PROPOFOL ;  Surgeon: Unk Corinn Skiff, MD;  Location: [facility] ENDOSCOPY;  Service: Gastroenterology;  Laterality: N/A;   FOOT CAPSULE RELEASE W/ PERCUTANEOUS HEEL CORD LENGTHENING, TIBIAL TENDON TRANSFER     age 30 ,and 2010-both legs   JOINT REPLACEMENT Right    both knees partial replacements dates unknown   knee replacment      x 5 per pt. last one- left knee 2014. has had 2 on R 3 on L   TYMPANOPLASTY WITH GRAFT Right 01/08/2018   Procedure: TYMPANOPLASTY WITH POSSIBLE OSSICULAR CHAIN RECONSTRUCTION;  Surgeon: Blair Mt, MD;  Location: [facility] ORS;  Service: ENT;  Laterality: Right;   Patient Active Problem List   Diagnosis Date Noted   Urge urinary incontinence 04/02/2023   Incontinence of feces 04/02/2023   Vaginal atrophy 04/02/2023   Asthma, persistent controlled 02/06/2023   Continuous leakage of urine 01/04/2023   De Quervain's tenosynovitis, right 10/08/2022   Hot flashes due to menopause 07/17/2022   Convulsions (HCC) 07/09/2022   Obesity due to excess calories 09/22/2020   Insomnia due to medical condition 05/11/2020   Hamstring tendonitis 01/29/2020   MDD (major depressive disorder), recurrent, in full remission (HCC) 08/12/2019   Seizure-like activity (HCC) 07/08/2019   Mild episode of recurrent major depressive disorder (HCC) 12/29/2018   Insomnia due to mental condition 12/29/2018   Disorder of vein 03/04/2018   Lumbar sprain 03/04/2018   Muscle weakness 03/04/2018   Neck sprain 03/04/2018   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   Postmenopausal bleeding 02/18/2018   Impingement syndrome of shoulder region 02/04/2018   Chest pain with moderate risk for cardiac etiology 05/19/2017   GAD (generalized anxiety disorder) 04/15/2017   Chronic daily headache 04/15/2017  Panic attack 04/15/2017   Nocturnal enuresis 04/15/2017   Mild cognitive impairment 04/15/2017   Loss of memory 01/14/2017   Pain medication agreement signed 01/08/2017   Headache disorder 12/04/2016   Chronic, continuous use of opioids 07/01/2015   Vertigo 07/01/2015   Chronic midline low back pain without sciatica 03/21/2015   Type 2 diabetes mellitus with other specified complication (HCC) 09/05/2014   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   Hip pain, chronic 04/28/2013   Essential hypertension 01/10/2013    Irritable colon 01/10/2013   Encounter for long-term (current) use of other medications 12/04/2012   Chronic GERD 08/12/2012   Diarrhea 08/12/2012   Primary localized osteoarthrosis, lower leg 05/07/2012   Difficulty walking 02/06/2012    PCP: Dr. Marsa Officer  REFERRING PROVIDER: Dr. Suzen Brantley Salters  REFERRING DIAG: Cervicalgia  THERAPY DIAG:  Difficulty in walking, not elsewhere classified  Stiffness of right knee, not elsewhere classified  Chronic pain of right knee  Rationale for Evaluation and Treatment: Rehabilitation ONSET DATE: Jan 2025  SUBJECTIVE:                                                                                                                                                                                                        SUBJECTIVE STATEMENT: Pt reports worsening low back pain.    PERTINENT HISTORY:  Pt reports insidious onset of Rt knee pain and twice weekly buckling episodes starting ~Jan 2025, has constant pain on the femur lateral to the patella. History of remote unicompartmental knee arthroplasty. Pt has not fallen due to this, but she is very concerned about a fall in the future due to her baseline mobility limitations. At eval pain is reported as 6/10 current, 4/10 best in past week, 8/10 worst in past week. Pt manages her pain at home with heat, ice, tylenol  arthritis, and/or percocet.   PAIN:  Anterior Rt knee pain, lateral to the patella, reports pain all the time, but worse after buckling episodes.    PRECAUTIONS: None WEIGHT BEARING RESTRICTIONS: No FALLS:  Has patient fallen in last 6 months? No  OCCUPATION: On disability  PLOF: Independent  PATIENT GOALS: Decrease pain, avoid falling, abolish buckling episodes.   OBJECTIVE:  PATIENT SURVEYS:  LEFS: to be issued on visit #2  ROM:  -Rt knee flexion: 105 degrees  -Rt knee extension: 24 degrees   STRENGTH ASSESSMENT:  -Rt knee extension: 5/5  -Rt hip  extension (straight knee, tested in supine, 30 degrees flexion): 3+/5  TODAY'S TREATMENT   11/25/23: THEREX   Lumbar Flexion AROM  with silver  ball flexion center  2 x 25   Hip Hitch Toe Taps on 3 inch step with BUE support  1 x 10  Standing marches on RLE and BUE support 2 x 10   QL Stretch 2 x 30 sec    Seated Marches on RLE on 3 inch book 1 x 10   Supine Clam Shells with green band on single leg 2 x 10   Supine HS Curls with red physio ball 2 x 10  Lower Trunk Rotation with red ball 2 x 10     PATIENT EDUCATION:  Education details: Explained how lacking 24 degrees of full extension can increase resting patella loading and increase risk of buckling.   Person educated: Patient Education method: collaborative learning, deliberate practice, positive reinforcement, explicit instruction, establish rules. Education comprehension: verbalized understanding, returned demonstration, verbal cues required, and tactile cues required  HOME EXERCISE PROGRAM: Access Code: [email or web address] URL: [email or web address] Date: 11/20/2023 Prepared by: Toribio Servant  Program Notes Seated Towel Slides 3 x 30 x 7 days per week   Exercises - Modified Thomas Stretch  - 1 x daily - 7 x weekly - 3 reps - 60 secsec hold - Seated Hamstring Stretch  - 1 x daily - 7 x weekly - 3 reps - 60 sec hold - Seated Quadratus Lumborum Stretch in Chair  - 1 x daily - 7 x weekly - 3 reps - 30-60 sec hold - Active Straight Leg Raise with Quad Set  - 3-4 x weekly - 2 sets - 10 reps - Standing Hip Hiking  - 3-4 x weekly - 2 sets - 10 reps - 5 sec hold - Standing March with Counter Support  - 3-4 x weekly - 2 sets - 10 reps - Sit to Stand  - 3-4 x weekly - 3 sets - 8-10 reps - Squat with Chair Touch and Resistance Loop  - 3-4 x weekly - 2 sets - 10 reps   ASSESSMENT: CLINICAL IMPRESSION: Despite worsening low back pain, pt was able to perform all exercises without being limited. Modified home exercise plan to  include supine hip abduction strengthener due to ongoing low back pain while performing standing hip strengthening exercises. She also exhibits improved hip mobility with use of physicoball below heels. She will continue to benefit from PT to reduced falls risk, improve walkign tolerance, reduce pain, and reduce caregiver burden.    OBJECTIVE IMPAIRMENTS: decreased ROM, decreased strength, impaired sensation, impaired UE functional use, and pain.   ACTIVITY LIMITATIONS: carrying, lifting, bending, bathing, dressing, reach over head, and hygiene/grooming PARTICIPATION LIMITATIONS: meal prep, cleaning, laundry, shopping, and community activity PERSONAL FACTORS: Age, Education, Sex, Social background, Time since onset of injury/illness/exacerbation, Transportation, and 3+ comorbidities: Cerebral Palsy, HTN, T2DM are also affecting patient's functional outcome.  REHAB POTENTIAL: Fair (timeline since knee surgery) CLINICAL DECISION MAKING: Medium  EVALUATION COMPLEXITY: complicated    GOALS: Goals reviewed with patient? YES  SHORT TERM GOALS: Target date: 11/08/23 Patient will report comprehension, confidence, and consistent compliance and of a simple home exercise program established to facilitate symptoms management and basic strengthening and/or segment mobility.    Baseline: NT 10/15/23: Performing exercises independently    Status: ACHIEVED    Patient to demonstrate improved score on LEFS by >9% to indicate reduced self-reported disability and/or pain.     Baseline: NOT MET     Status: 19/80 or 24%   11/11/23: 20/80 or  25%    LONG TERM GOALS: Target date: 12/09/23  1. Patient to improve score on LEFS by 20% or greater to indicate reduced disability and improved quality of life.    Baseline: 19/80 (24%) 11/11/23: 20/80 or 25%   Status: ONGOING    2. Pt to report no episodes of knee buckling in most recent 2 weeks to indicate improved stability ogf knee joint loading in ADL performance.    Baseline: twice weekly episodes  11/18/23: Not giving out but painful.  Status: ACHIEVED     3. Pt to report worse pain rating in most recent 2 weeks no greater than 6/10 to indiccate greater stability of pain symptoms with daily activity/mobility.   Baseline: 8/10 worst pain 11/25/23: 7/10 feels achy now and it used to be sharp    Status: ONGOING   PLAN:  PT FREQUENCY: 1-2x/week PT DURATION: 8 weeks PLANNED INTERVENTIONS: 97164- PT Re-evaluation, 97110-Therapeutic exercises, 97530- Therapeutic activity, 97112- Neuromuscular re-education, 97535- Self Care, 02859- Manual therapy, [phone]- Gait training, [phone]- Orthotic Fit/training, [phone]- Prosthetic training, [phone]- Aquatic Therapy, [phone]- Electrical stimulation (unattended), [phone]- Electrical stimulation (manual), [id number]- Traction (mechanical), Patient/Family education, Dry Needling, Joint mobilization, Joint manipulation, Spinal manipulation, Spinal mobilization, Cryotherapy, and Moist heat  PLAN FOR NEXT SESSION: Ongoing work with physioball and static sitting balance exercises. Overground ambulation with standing exercises between bouts of walking.  Toribio Servant PT, DPT  [facility] [address], [place], [address] Phone: [phone]   Fax:  [phone]

## 2023-11-27 ENCOUNTER — Encounter: Admitting: Physical Therapy

## 2023-11-27 ENCOUNTER — Other Ambulatory Visit: Payer: Self-pay | Admitting: Pharmacist

## 2023-11-27 ENCOUNTER — Ambulatory Visit: Admitting: Physical Therapy

## 2023-11-27 ENCOUNTER — Encounter: Payer: Self-pay | Admitting: Pharmacist

## 2023-11-27 DIAGNOSIS — E1169 Type 2 diabetes mellitus with other specified complication: Secondary | ICD-10-CM

## 2023-11-27 DIAGNOSIS — G8929 Other chronic pain: Secondary | ICD-10-CM | POA: Diagnosis not present

## 2023-11-27 DIAGNOSIS — M25661 Stiffness of right knee, not elsewhere classified: Secondary | ICD-10-CM | POA: Diagnosis not present

## 2023-11-27 DIAGNOSIS — M25561 Pain in right knee: Secondary | ICD-10-CM | POA: Diagnosis not present

## 2023-11-27 DIAGNOSIS — R262 Difficulty in walking, not elsewhere classified: Secondary | ICD-10-CM | POA: Diagnosis not present

## 2023-11-27 DIAGNOSIS — Z794 Long term (current) use of insulin: Secondary | ICD-10-CM

## 2023-11-27 NOTE — Progress Notes (Signed)
   11/27/2023  Patient ID: Traci Davis, female   DOB: 1971-02-11, 53 y.o.   MRN: [id number]  Receive a voicemail from patient requesting a call back.   Return call to patient. Reports that she was having a problem with her Dexcom G7 sensor where the readings were significantly different from those that she was seeing with fingerstick checking with glucometer.  Review that one reason for the difference in these numbers can be that the glucometer measures a sample from the blood, while Dexcom G7 measures interstitial fluid, so there can be a lag between the change in blood sugar level in the blood to change in the interstitial fluid. Some other factors that influence CGM accuracy include time from insertion of sensor (CGM numbers may be less accurate in the first 24 hours after insertion) or if there is pressure against the sensor. Factors impacting test strip accuracy include hand cleanliness and storage of test strips   Patient shares that sensor stopped working so she replaced with new sensor and is now successfully getting readings again.  Will provide patient with contact information via MyChart message, as requested for Dexcom Support [phone]) to request replacement sensor.    Sharyle Sia, PharmD, JAQUELINE, CPP Clinical Pharmacist [facility] [phone]

## 2023-11-27 NOTE — Patient Instructions (Signed)
 Good afternoon!   The following is the phone number for Dexcom Customer Support: [phone]   You can also reach out to customer support within your Dexcom App by clicking on Profile at the bottom of the App screen and then selecting Contact.   Thank you!   Sharyle Sia, PharmD, [facility] Clinical Pharmacist [facility] [phone]

## 2023-11-27 NOTE — Therapy (Signed)
 OUTPATIENT PHYSICAL THERAPY TREATMENT      Patient Name: Traci Davis MRN: [id number] DOB:17-Aug-1970, 53 y.o., female Today's Date: 11/27/2023  END OF SESSION:  PT End of Session - 11/27/23 1614     Visit Number 11    Number of Visits 20    Date for PT Re-Evaluation 12/04/23    Authorization Type UHC Medicare Dual (VL based on mn)    Authorization - Visit Number 11    Authorization - Number of Visits 20    Progress Note Due on Visit 20    PT Start Time 1605    PT Stop Time 1645    PT Time Calculation (min) 40 min    Activity Tolerance Patient tolerated treatment well    Behavior During Therapy [facility] for tasks assessed/performed              Past Medical History:  Diagnosis Date   Acute pancreatitis 09/04/2014   Anxiety    Arthritis    Asthma    Cerebral palsy (HCC)    Complication of anesthesia    panic attacks before surgery   COPD (chronic obstructive pulmonary disease) (HCC)    Depression    Diabetes mellitus without complication (HCC)    GERD (gastroesophageal reflux disease)    Hypertension    Noninfectious gastroenteritis and colitis 01/10/2013   Past Surgical History:  Procedure Laterality Date   CESAREAN SECTION  1995   CHOLECYSTECTOMY     DILATATION & CURETTAGE/HYSTEROSCOPY WITH MYOSURE N/A 02/20/2018   Procedure: DILATATION & CURETTAGE/HYSTEROSCOPY WITH MYOSURE ENDOMETRIAL POLYPECTOMY;  Surgeon: Leonce Garnette BIRCH, MD;  Location: [facility];  Service: Gynecology;  Laterality: N/A;   ESOPHAGOGASTRODUODENOSCOPY (EGD) WITH PROPOFOL  N/A 01/01/2019   Procedure: ESOPHAGOGASTRODUODENOSCOPY (EGD) WITH PROPOFOL ;  Surgeon: Unk Corinn Skiff, MD;  Location: [facility];  Service: Gastroenterology;  Laterality: N/A;   FOOT CAPSULE RELEASE W/ PERCUTANEOUS HEEL CORD LENGTHENING, TIBIAL TENDON TRANSFER     age 33 ,and 2010-both legs   JOINT REPLACEMENT Right    both knees partial replacements dates unknown   knee replacment     x 5 per pt. last one- left knee  2014. has had 2 on R 3 on L   TYMPANOPLASTY WITH GRAFT Right 01/08/2018   Procedure: TYMPANOPLASTY WITH POSSIBLE OSSICULAR CHAIN RECONSTRUCTION;  Surgeon: Blair Mt, MD;  Location: [facility];  Service: ENT;  Laterality: Right;   Patient Active Problem List   Diagnosis Date Noted   Urge urinary incontinence 04/02/2023   Incontinence of feces 04/02/2023   Vaginal atrophy 04/02/2023   Asthma, persistent controlled 02/06/2023   Continuous leakage of urine 01/04/2023   De Quervain's tenosynovitis, right 10/08/2022   Hot flashes due to menopause 07/17/2022   Convulsions (HCC) 07/09/2022   Obesity due to excess calories 09/22/2020   Insomnia due to medical condition 05/11/2020   Hamstring tendonitis 01/29/2020   MDD (major depressive disorder), recurrent, in full remission (HCC) 08/12/2019   Seizure-like activity (HCC) 07/08/2019   Mild episode of recurrent major depressive disorder (HCC) 12/29/2018   Insomnia due to mental condition 12/29/2018   Disorder of vein 03/04/2018   Lumbar sprain 03/04/2018   Muscle weakness 03/04/2018   Neck sprain 03/04/2018   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   Postmenopausal bleeding 02/18/2018   Impingement syndrome of shoulder region 02/04/2018   Chest pain with moderate risk for cardiac etiology 05/19/2017   GAD (generalized anxiety disorder) 04/15/2017   Chronic daily headache 04/15/2017   Panic attack 04/15/2017   Nocturnal  enuresis 04/15/2017   Mild cognitive impairment 04/15/2017   Loss of memory 01/14/2017   Pain medication agreement signed 01/08/2017   Headache disorder 12/04/2016   Chronic, continuous use of opioids 07/01/2015   Vertigo 07/01/2015   Chronic midline low back pain without sciatica 03/21/2015   Type 2 diabetes mellitus with other specified complication (HCC) 09/05/2014   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   Hip pain, chronic 04/28/2013   Essential hypertension 01/10/2013   Irritable colon 01/10/2013   Encounter  for long-term (current) use of other medications 12/04/2012   Chronic GERD 08/12/2012   Diarrhea 08/12/2012   Primary localized osteoarthrosis, lower leg 05/07/2012   Difficulty walking 02/06/2012    PCP: Dr. Marsa Officer  REFERRING PROVIDER: Dr. Suzen Brantley Salters  REFERRING DIAG: Cervicalgia  THERAPY DIAG:  Difficulty in walking, not elsewhere classified  Stiffness of right knee, not elsewhere classified  Rationale for Evaluation and Treatment: Rehabilitation ONSET DATE: Jan 2025  SUBJECTIVE:                                                                                                                                                                                                        SUBJECTIVE STATEMENT: Pt states that she feels increased fatigue and stiffness today. She had been running a lot of errands before the visit.    PERTINENT HISTORY:  Pt reports insidious onset of Rt knee pain and twice weekly buckling episodes starting ~Jan 2025, has constant pain on the femur lateral to the patella. History of remote unicompartmental knee arthroplasty. Pt has not fallen due to this, but she is very concerned about a fall in the future due to her baseline mobility limitations. At eval pain is reported as 6/10 current, 4/10 best in past week, 8/10 worst in past week. Pt manages her pain at home with heat, ice, tylenol  arthritis, and/or percocet.   PAIN:  Anterior Rt knee pain, lateral to the patella, reports pain all the time, but worse after buckling episodes.    PRECAUTIONS: None WEIGHT BEARING RESTRICTIONS: No FALLS:  Has patient fallen in last 6 months? No  OCCUPATION: On disability  PLOF: Independent  PATIENT GOALS: Decrease pain, avoid falling, abolish buckling episodes.   OBJECTIVE:  PATIENT SURVEYS:  LEFS: to be issued on visit #2  ROM:  -Rt knee flexion: 105 degrees  -Rt knee extension: 24 degrees   STRENGTH ASSESSMENT:  -Rt knee extension:  5/5  -Rt hip extension (straight knee, tested in supine, 30 degrees flexion): 3+/5  TODAY'S TREATMENT   11/27/23:  JERRE  Lumbar Flexion AROM with silver  ball flexion center  2 x 25   Seated Hip Flexion  AROM 1 x 10 Seated Long Arc Quad on RLE AROM 1 x 10   Seated Long Arc Quad on RLE #2.5 AW 1 x 10   Seated Marches on LLE 2 x 10 Seated Marches with #1 AW 2 x 10    Supine HS curl with heel on silver  ball 1 x 10  Supine Lower Trunk Rotation with heels on silver  ball 2 x 10   Quad Set on LLE with 5 sec hold 2 x 10  Seated HS Isometric on LLE 3 sec hold 1 x 10     MANUAL  HS Stretch  6 x 60 sec   Adductor Stretch 6 x 60 sec      PATIENT EDUCATION:  Education details: Explained how lacking 24 degrees of full extension can increase resting patella loading and increase risk of buckling.   Person educated: Patient Education method: collaborative learning, deliberate practice, positive reinforcement, explicit instruction, establish rules. Education comprehension: verbalized understanding, returned demonstration, verbal cues required, and tactile cues required  HOME EXERCISE PROGRAM: Access Code: [email or web address] URL: [email or web address] Date: 11/27/2023 Prepared by: Toribio Servant  Program Notes Seated Towel Slides 3 x 30 x 7 days per week   Exercises - Modified Thomas Stretch  - 1 x daily - 7 x weekly - 3 reps - 60 secsec hold - Seated Hamstring Stretch  - 1 x daily - 7 x weekly - 3 reps - 60 sec hold - Seated Quadratus Lumborum Stretch in Chair  - 1 x daily - 7 x weekly - 3 reps - 30-60 sec hold - Active Straight Leg Raise with Quad Set  - 3-4 x weekly - 2 sets - 10 reps - Hooklying Clamshell with Resistance  - 3-4 x weekly - 3 sets - 10 reps - Seated March  - 3-4 x weekly - 3 sets - 10 reps - Sit to Stand  - 3-4 x weekly - 3 sets - 8-10 reps - Seated Hamstring Set  - 3-4 x weekly - 3 sets - 10 reps - 3 sec hold - Seated Quad Set  - 3-4 x weekly - 2 sets - 10 reps  - 3 sec hold   ASSESSMENT: CLINICAL IMPRESSION: Pt shows improvement in pain tolerance with decrease in knee and low back pain when performing exercises. Modified exercises to include additional isometrics for left side because of limited mobility due to spasticity from cerebal palsy. She will continue to benefit from PT to reduced falls risk, improve walkign tolerance, reduce pain, and reduce caregiver burden.   OBJECTIVE IMPAIRMENTS: decreased ROM, decreased strength, impaired sensation, impaired UE functional use, and pain.   ACTIVITY LIMITATIONS: carrying, lifting, bending, bathing, dressing, reach over head, and hygiene/grooming PARTICIPATION LIMITATIONS: meal prep, cleaning, laundry, shopping, and community activity PERSONAL FACTORS: Age, Education, Sex, Social background, Time since onset of injury/illness/exacerbation, Transportation, and 3+ comorbidities: Cerebral Palsy, HTN, T2DM are also affecting patient's functional outcome.  REHAB POTENTIAL: Fair (timeline since knee surgery) CLINICAL DECISION MAKING: Medium  EVALUATION COMPLEXITY: complicated    GOALS: Goals reviewed with patient? YES  SHORT TERM GOALS: Target date: 11/08/23 Patient will report comprehension, confidence, and consistent compliance and of a simple home exercise program established to facilitate symptoms management and basic strengthening and/or segment mobility.    Baseline: NT 10/15/23: Performing exercises independently    Status: ACHIEVED    Patient to demonstrate improved score on LEFS  by >9% to indicate reduced self-reported disability and/or pain.     Baseline: NOT MET     Status: 19/80 or 24%   11/11/23: 20/80 or  25%    LONG TERM GOALS: Target date: 12/09/23  1. Patient to improve score on LEFS by 20% or greater to indicate reduced disability and improved quality of life.    Baseline: 19/80 (24%) 11/11/23: 20/80 or 25%   Status: ONGOING    2. Pt to report no episodes of knee buckling in most recent 2  weeks to indicate improved stability ogf knee joint loading in ADL performance.   Baseline: twice weekly episodes  11/18/23: Not giving out but painful.  Status: ACHIEVED     3. Pt to report worse pain rating in most recent 2 weeks no greater than 6/10 to indiccate greater stability of pain symptoms with daily activity/mobility.   Baseline: 8/10 worst pain 11/25/23: 7/10 feels achy now and it used to be sharp    Status: ONGOING   PLAN:  PT FREQUENCY: 1-2x/week PT DURATION: 8 weeks PLANNED INTERVENTIONS: 97164- PT Re-evaluation, 97110-Therapeutic exercises, 97530- Therapeutic activity, 97112- Neuromuscular re-education, 97535- Self Care, 02859- Manual therapy, [phone]- Gait training, [phone]- Orthotic Fit/training, [phone]- Prosthetic training, [phone]- Aquatic Therapy, [phone]- Electrical stimulation (unattended), [phone]- Electrical stimulation (manual), [id number]- Traction (mechanical), Patient/Family education, Dry Needling, Joint mobilization, Joint manipulation, Spinal manipulation, Spinal mobilization, Cryotherapy, and Moist heat  PLAN FOR NEXT SESSION: Reassess long term goals. Ongoing work with physioball and static sitting balance exercises. Overground ambulation with hip strengthening exercises between bouts.     Toribio Servant PT, DPT  [facility] [address], [place], [address] Phone: [phone]   Fax:  [phone]

## 2023-11-29 DIAGNOSIS — G801 Spastic diplegic cerebral palsy: Secondary | ICD-10-CM | POA: Diagnosis not present

## 2023-12-02 ENCOUNTER — Ambulatory Visit: Admitting: Physical Therapy

## 2023-12-04 ENCOUNTER — Ambulatory Visit: Attending: Physical Medicine and Rehabilitation | Admitting: Physical Therapy

## 2023-12-04 DIAGNOSIS — M25561 Pain in right knee: Secondary | ICD-10-CM | POA: Diagnosis not present

## 2023-12-04 DIAGNOSIS — R262 Difficulty in walking, not elsewhere classified: Secondary | ICD-10-CM | POA: Diagnosis not present

## 2023-12-04 DIAGNOSIS — M25661 Stiffness of right knee, not elsewhere classified: Secondary | ICD-10-CM | POA: Insufficient documentation

## 2023-12-04 DIAGNOSIS — G8929 Other chronic pain: Secondary | ICD-10-CM | POA: Insufficient documentation

## 2023-12-04 NOTE — Therapy (Addendum)
 OUTPATIENT PHYSICAL THERAPY RE-EVALUATION       Patient Name: Traci Davis MRN: [id number] DOB:[birth year], 53 y.o., female Today's Date: 12/04/2023  END OF SESSION:  PT End of Session - 12/04/23 1609     Visit Number 12    Number of Visits 20    Date for PT Re-Evaluation 12/04/23    Authorization Type UHC Medicare Dual (VL based on mn)    Authorization - Visit Number 12    Authorization - Number of Visits 20    Progress Note Due on Visit 20    PT Start Time 1605    PT Stop Time 1645    PT Time Calculation (min) 40 min    Activity Tolerance Patient tolerated treatment well    Behavior During Therapy [facility] for tasks assessed/performed               Past Medical History:  Diagnosis Date   Acute pancreatitis 09/04/2014   Anxiety    Arthritis    Asthma    Cerebral palsy (HCC)    Complication of anesthesia    panic attacks before surgery   COPD (chronic obstructive pulmonary disease) (HCC)    Depression    Diabetes mellitus without complication (HCC)    GERD (gastroesophageal reflux disease)    Hypertension    Noninfectious gastroenteritis and colitis 01/10/2013   Past Surgical History:  Procedure Laterality Date   CESAREAN SECTION  1995   CHOLECYSTECTOMY     DILATATION & CURETTAGE/HYSTEROSCOPY WITH MYOSURE N/A 02/20/2018   Procedure: DILATATION & CURETTAGE/HYSTEROSCOPY WITH MYOSURE ENDOMETRIAL POLYPECTOMY;  Surgeon: Leonce Garnette BIRCH, MD;  Location: [facility];  Service: Gynecology;  Laterality: N/A;   ESOPHAGOGASTRODUODENOSCOPY (EGD) WITH PROPOFOL  N/A 01/01/2019   Procedure: ESOPHAGOGASTRODUODENOSCOPY (EGD) WITH PROPOFOL ;  Surgeon: Unk Corinn Skiff, MD;  Location: [facility];  Service: Gastroenterology;  Laterality: N/A;   FOOT CAPSULE RELEASE W/ PERCUTANEOUS HEEL CORD LENGTHENING, TIBIAL TENDON TRANSFER     age 25 ,and 2010-both legs   JOINT REPLACEMENT Right    both knees partial replacements dates unknown   knee replacment     x 5 per pt. last one-  left knee 2014. has had 2 on R 3 on L   TYMPANOPLASTY WITH GRAFT Right 01/08/2018   Procedure: TYMPANOPLASTY WITH POSSIBLE OSSICULAR CHAIN RECONSTRUCTION;  Surgeon: Blair Mt, MD;  Location: [facility] OR[facility];  Service: ENT;  Laterality: Right;   Patient Active Problem List   Diagnosis Date Noted   Urge urinary incontinence 04/02/2023   Incontinence of feces 04/02/2023   Vaginal atrophy 04/02/2023   Asthma, persistent controlled 02/06/2023   Continuous leakage of urine 01/04/2023   De Quervain's tenosynovitis, right 10/08/2022   Hot flashes due to menopause 07/17/2022   Convulsions (HCC) 07/09/2022   Obesity due to excess calories 09/22/2020   Insomnia due to medical condition 05/11/2020   Hamstring tendonitis 01/29/2020   MDD (major depressive disorder), recurrent, in full remission (HCC) 08/12/2019   Seizure-like activity (HCC) 07/08/2019   Mild episode of recurrent major depressive disorder (HCC) 12/29/2018   Insomnia due to mental condition 12/29/2018   Disorder of vein 03/04/2018   Lumbar sprain 03/04/2018   Muscle weakness 03/04/2018   Neck sprain 03/04/2018   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   Postmenopausal bleeding 02/18/2018   Impingement syndrome of shoulder region 02/04/2018   Chest pain with moderate risk for cardiac etiology 05/19/2017   GAD (generalized anxiety disorder) 04/15/2017   Chronic daily headache 04/15/2017   Panic attack 04/15/2017  Nocturnal enuresis 04/15/2017   Mild cognitive impairment 04/15/2017   Loss of memory 01/14/2017   Pain medication agreement signed 01/08/2017   Headache disorder 12/04/2016   Chronic, continuous use of opioids 07/01/2015   Vertigo 07/01/2015   Chronic midline low back pain without sciatica 03/21/2015   Type 2 diabetes mellitus with other specified complication (HCC) 09/05/2014   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   Hip pain, chronic 04/28/2013   Essential hypertension 01/10/2013   Irritable colon 01/10/2013    Encounter for long-term (current) use of other medications 12/04/2012   Chronic GERD 08/12/2012   Diarrhea 08/12/2012   Primary localized osteoarthrosis, lower leg 05/07/2012   Difficulty walking 02/06/2012    PCP: Dr. Marsa Officer  REFERRING PROVIDER: Dr. Suzen Brantley Salters  REFERRING DIAG: Cervicalgia  THERAPY DIAG:  Difficulty in walking, not elsewhere classified  Stiffness of right knee, not elsewhere classified  Rationale for Evaluation and Treatment: Rehabilitation ONSET DATE: Jan 2025  SUBJECTIVE:                                                                                                                                                                                                        SUBJECTIVE STATEMENT: Pt reports that she almost did not make it because she had trouble starting her car. She is accompanied by her grand daughter. She has felt some pain in right knee but much less than normal. She continues to stretch but she reports noticing little progress in her stiffness.   PERTINENT HISTORY:  Pt reports insidious onset of Rt knee pain and twice weekly buckling episodes starting ~Jan 2025, has constant pain on the femur lateral to the patella. History of remote unicompartmental knee arthroplasty. Pt has not fallen due to this, but she is very concerned about a fall in the future due to her baseline mobility limitations. At eval pain is reported as 6/10 current, 4/10 best in past week, 8/10 worst in past week. Pt manages her pain at home with heat, ice, tylenol  arthritis, and/or percocet.   PAIN:  Anterior Rt knee pain, lateral to the patella, reports pain all the time, but worse after buckling episodes.    PRECAUTIONS: None WEIGHT BEARING RESTRICTIONS: No FALLS:  Has patient fallen in last 6 months? No  OCCUPATION: On disability  PLOF: Independent  PATIENT GOALS: Decrease pain, avoid falling, abolish buckling episodes.   OBJECTIVE:  PATIENT  SURVEYS:  LEFS: 25% or 20/80 12/04/23   ROM:  -Rt knee flexion: 105 degrees  -Rt knee extension: 24 degrees   STRENGTH ASSESSMENT:  -  Rt knee extension: 5/5  -Rt hip extension (straight knee, tested in supine, 30 degrees flexion): 3+/5  TODAY'S TREATMENT   12/04/23: THEREX: All performed on RLE   Lumbar Flexion AROM with silver  ball flexion center, left and right  2 x 25   LEFS:  20/80 (25%) Knee Flexion/Extension AROM on slider 1 x 10   Long Arc Quad AROM 1 x 10    Long Arc Quad with yellow band 1 x 10   Seated Calf Stretch on 6 inch step  2 x 60 sec   -Pt unable to maintain stretch   Seated Calf Stretch with heel on 4 inch book 2 x 60 sec   Seated HS Stretch 2 x 60 sec  -min TC  and VC to press down on knee for improved knee extension   Standing Marches on RLE with BUE support 2 x 10  Seated Marches on RLE 2 x 10   Quarter Squats with BUE support  1 x 10   -external cue to tap table with buttocks   SELF CARE HOME MANAGEMENT   Instructions on only using stretching for pain relief purposes and to not perform to resolve ongoing stiffness as stiffness is driven by spasticity and non muscle length shortness.        PATIENT EDUCATION:  Education details: Explained how lacking 24 degrees of full extension can increase resting patella loading and increase risk of buckling.   Person educated: Patient Education method: collaborative learning, deliberate practice, positive reinforcement, explicit instruction, establish rules. Education comprehension: verbalized understanding, returned demonstration, verbal cues required, and tactile cues required  HOME EXERCISE PROGRAM: Access Code: [email or web address] URL: [email or web address] Date: 12/04/2023 Prepared by: Toribio Servant  Program Notes Seated Towel Slides 3 x 30 x 7 days per week   Exercises - Modified Thomas Stretch  - 1 x daily - 7 x weekly - 3 reps - 60 secsec hold - Seated Gastroc Stretch with Strap  - 1 x daily - 7 x  weekly - 3 reps - 60 sec  hold - Seated Hamstring Stretch  - 1 x daily - 7 x weekly - 3 reps - 60 sec hold - Seated Quadratus Lumborum Stretch in Chair  - 1 x daily - 7 x weekly - 3 reps - 30-60 sec hold - Standing Marching  - 3-4 x weekly - 3 sets - 10 reps - Quarter Squat with Table  - 3-4 x weekly - 3 sets - 10 reps - Sitting Knee Extension with Resistance  - 3-4 x weekly - 3 sets - 10 reps - Hooklying Clamshell with Resistance  - 3-4 x weekly - 3 sets - 10 reps - Seated Hamstring Set  - 3-4 x weekly - 3 sets - 10 reps - 3 sec hold - Seated Quad Set  - 3-4 x weekly - 2 sets - 10 reps - 3 sec hold   ASSESSMENT: CLINICAL IMPRESSION: Pt showing limited progress towards goals with pt's perception of RLE and right knee pain remaining similar to score documented several weeks ago. Pt has been experiencing less pain limitations from right knee pain and ongoing knee pain is likely from arthritis as it improves with movement. PT emphasized focusing on RLE strengthening exercises rather than stretches in order to improve progress towards rehab goals. PT reviewed strengthening exercise for patient highlight which ones to complete. Pt is in agreement with the plan and she continues to remain motivated. She will continue to benefit from PT to reduced falls  risk, improve walkign tolerance, reduce pain, and reduce caregiver burden.   OBJECTIVE IMPAIRMENTS: decreased ROM, decreased strength, impaired sensation, impaired UE functional use, and pain.   ACTIVITY LIMITATIONS: carrying, lifting, bending, bathing, dressing, reach over head, and hygiene/grooming PARTICIPATION LIMITATIONS: meal prep, cleaning, laundry, shopping, and community activity PERSONAL FACTORS: Age, Education, Sex, Social background, Time since onset of injury/illness/exacerbation, Transportation, and 3+ comorbidities: Cerebral Palsy, HTN, T2DM are also affecting patient's functional outcome.  REHAB POTENTIAL: Fair (timeline since knee  surgery) CLINICAL DECISION MAKING: Medium  EVALUATION COMPLEXITY: complicated    GOALS: Goals reviewed with patient? YES  SHORT TERM GOALS: Target date: 11/08/23 Patient will report comprehension, confidence, and consistent compliance and of a simple home exercise program established to facilitate symptoms management and basic strengthening and/or segment mobility.    Baseline: NT 10/15/23: Performing exercises independently    Status: ACHIEVED    Patient to demonstrate improved score on LEFS by >9% to indicate reduced self-reported disability and/or pain.     Baseline: NOT MET     Status: 19/80 or 24%   11/11/23: 20/80 or  25%    LONG TERM GOALS: Target date: 12/09/23  1. Patient to improve score on LEFS by 20% or greater to indicate reduced disability and improved quality of life.    Baseline: 19/80 (24%) 11/11/23: 20/80 or 25% 12/04/23: 25%    Status: ONGOING    2. Pt to report no episodes of knee buckling in most recent 2 weeks to indicate improved stability ogf knee joint loading in ADL performance.   Baseline: twice weekly episodes  11/18/23: Right knee is not giving out but painful.  12/04/23 Not giving out and not pain   Status: ACHIEVED     3. Pt to report worse pain rating in most recent 2 weeks no greater than 6/10 to indicate greater stability of pain symptoms with daily activity/mobility.   Baseline: 8/10 worst pain 11/25/23: 7/10 feels achy now and it used to be sharp   Status: ONGOING   PLAN:  PT FREQUENCY: 1-2x/week PT DURATION: 8 weeks PLANNED INTERVENTIONS: 97164- PT Re-evaluation, 97110-Therapeutic exercises, 97530- Therapeutic activity, 97112- Neuromuscular re-education, 97535- Self Care, 02859- Manual therapy, [phone]- Gait training, [phone]- Orthotic Fit/training, [phone]- Prosthetic training, [phone]- Aquatic Therapy, [phone]- Electrical stimulation (unattended), [phone]- Electrical stimulation (manual), [id number]- Traction (mechanical), Patient/Family education, Dry Needling, Joint  mobilization, Joint manipulation, Spinal manipulation, Spinal mobilization, Cryotherapy, and Moist heat  PLAN FOR NEXT SESSION: Focus on right knee and hip strengthening in weight bearing positions along with overground ambulation for circuit training   Toribio Servant PT, DPT  [facility] [address], [place], [address] Phone: [phone]   Fax:  [phone]

## 2023-12-04 NOTE — Addendum Note (Signed)
 Addended by: THEOTIS TORIBIO PARAS on: 12/04/2023 05:51 PM   Modules accepted: Orders

## 2023-12-09 ENCOUNTER — Ambulatory Visit: Admitting: Physical Therapy

## 2023-12-09 DIAGNOSIS — M25661 Stiffness of right knee, not elsewhere classified: Secondary | ICD-10-CM

## 2023-12-09 DIAGNOSIS — G8929 Other chronic pain: Secondary | ICD-10-CM | POA: Diagnosis not present

## 2023-12-09 DIAGNOSIS — M25561 Pain in right knee: Secondary | ICD-10-CM | POA: Diagnosis not present

## 2023-12-09 DIAGNOSIS — R262 Difficulty in walking, not elsewhere classified: Secondary | ICD-10-CM | POA: Diagnosis not present

## 2023-12-09 NOTE — Therapy (Signed)
 OUTPATIENT PHYSICAL THERAPY TREATMENT       Patient Name: Traci Davis MRN: [id number] DOB:1970/11/05, 53 y.o., female Today's Date: 12/09/2023  END OF SESSION:  PT End of Session - 12/09/23 1610     Visit Number 13    Number of Visits 20    Date for PT Re-Evaluation 02/13/24    Authorization Type [employer] Medicare Dual (VL based on mn)    Authorization - Visit Number 13    Authorization - Number of Visits 20    Progress Note Due on Visit 20    PT Start Time 1605    PT Stop Time 1645    PT Time Calculation (min) 40 min    Activity Tolerance Patient tolerated treatment well    Behavior During Therapy [facility] for tasks assessed/performed               Past Medical History:  Diagnosis Date   Acute pancreatitis 09/04/2014   Anxiety    Arthritis    Asthma    Cerebral palsy (HCC)    Complication of anesthesia    panic attacks before surgery   COPD (chronic obstructive pulmonary disease) (HCC)    Depression    Diabetes mellitus without complication (HCC)    GERD (gastroesophageal reflux disease)    Hypertension    Noninfectious gastroenteritis and colitis 01/10/2013   Past Surgical History:  Procedure Laterality Date   CESAREAN SECTION  1995   CHOLECYSTECTOMY     DILATATION & CURETTAGE/HYSTEROSCOPY WITH MYOSURE N/A 02/20/2018   Procedure: DILATATION & CURETTAGE/HYSTEROSCOPY WITH MYOSURE ENDOMETRIAL POLYPECTOMY;  Surgeon: Leonce Garnette BIRCH, MD;  Location: [facility];  Service: Gynecology;  Laterality: N/A;   ESOPHAGOGASTRODUODENOSCOPY (EGD) WITH PROPOFOL  N/A 01/01/2019   Procedure: ESOPHAGOGASTRODUODENOSCOPY (EGD) WITH PROPOFOL ;  Surgeon: Unk Corinn Skiff, MD;  Location: [facility];  Service: Gastroenterology;  Laterality: N/A;   FOOT CAPSULE RELEASE W/ PERCUTANEOUS HEEL CORD LENGTHENING, TIBIAL TENDON TRANSFER     age 22 ,and 2010-both legs   JOINT REPLACEMENT Right    both knees partial replacements dates unknown   knee replacment     x 5 per pt. last one- left  knee 2014. has had 2 on R 3 on L   TYMPANOPLASTY WITH GRAFT Right 01/08/2018   Procedure: TYMPANOPLASTY WITH POSSIBLE OSSICULAR CHAIN RECONSTRUCTION;  Surgeon: Blair Mt, MD;  Location: [facility];  Service: ENT;  Laterality: Right;   Patient Active Problem List   Diagnosis Date Noted   Urge urinary incontinence 04/02/2023   Incontinence of feces 04/02/2023   Vaginal atrophy 04/02/2023   Asthma, persistent controlled 02/06/2023   Continuous leakage of urine 01/04/2023   De Quervain's tenosynovitis, right 10/08/2022   Hot flashes due to menopause 07/17/2022   Convulsions (HCC) 07/09/2022   Obesity due to excess calories 09/22/2020   Insomnia due to medical condition 05/11/2020   Hamstring tendonitis 01/29/2020   MDD (major depressive disorder), recurrent, in full remission (HCC) 08/12/2019   Seizure-like activity (HCC) 07/08/2019   Mild episode of recurrent major depressive disorder (HCC) 12/29/2018   Insomnia due to mental condition 12/29/2018   Disorder of vein 03/04/2018   Lumbar sprain 03/04/2018   Muscle weakness 03/04/2018   Neck sprain 03/04/2018   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   Postmenopausal bleeding 02/18/2018   Impingement syndrome of shoulder region 02/04/2018   Chest pain with moderate risk for cardiac etiology 05/19/2017   GAD (generalized anxiety disorder) 04/15/2017   Chronic daily headache 04/15/2017   Panic attack 04/15/2017  Nocturnal enuresis 04/15/2017   Mild cognitive impairment 04/15/2017   Loss of memory 01/14/2017   Pain medication agreement signed 01/08/2017   Headache disorder 12/04/2016   Chronic, continuous use of opioids 07/01/2015   Vertigo 07/01/2015   Chronic midline low back pain without sciatica 03/21/2015   Type 2 diabetes mellitus with other specified complication (HCC) 09/05/2014   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   Hip pain, chronic 04/28/2013   Essential hypertension 01/10/2013   Irritable colon 01/10/2013    Encounter for long-term (current) use of other medications 12/04/2012   Chronic GERD 08/12/2012   Diarrhea 08/12/2012   Primary localized osteoarthrosis, lower leg 05/07/2012   Difficulty walking 02/06/2012    PCP: Dr. Marsa Officer  REFERRING PROVIDER: Dr. Suzen Brantley Salters  REFERRING DIAG: Cervicalgia  THERAPY DIAG:  Difficulty in walking, not elsewhere classified  Stiffness of right knee, not elsewhere classified  Rationale for Evaluation and Treatment: Rehabilitation ONSET DATE: Jan 2025  SUBJECTIVE:                                                                                                                                                                                                        SUBJECTIVE STATEMENT: Pt states that she is having increased back and hip pain and she thinks it might be because of the arthritis.   PERTINENT HISTORY:  Pt reports insidious onset of Rt knee pain and twice weekly buckling episodes starting ~Jan 2025, has constant pain on the femur lateral to the patella. History of remote unicompartmental knee arthroplasty. Pt has not fallen due to this, but she is very concerned about a fall in the future due to her baseline mobility limitations. At eval pain is reported as 6/10 current, 4/10 best in past week, 8/10 worst in past week. Pt manages her pain at home with heat, ice, tylenol  arthritis, and/or percocet.   PAIN:  6-7/10 Right lumbar paraspinal and over into right buttocks   PRECAUTIONS: None WEIGHT BEARING RESTRICTIONS: No FALLS:  Has patient fallen in last 6 months? No  OCCUPATION: On disability  PLOF: Independent  PATIENT GOALS: Decrease pain, avoid falling, abolish buckling episodes.   OBJECTIVE:  PATIENT SURVEYS:  LEFS: 25% or 20/80 12/04/23   ROM:  -Rt knee flexion: 105 degrees  -Rt knee extension: 24 degrees   STRENGTH ASSESSMENT:  -Rt knee extension: 5/5  -Rt hip extension (straight knee, tested in supine, 30  degrees flexion): 3+/5  TODAY'S TREATMENT   12/04/23: THEREX: All performed on RLE   Lumbar Flexion AROM with silver  ball flexion  center, left and right  2 x 25   Long Arch Quad on RLE 1 x 10   Sit to Stand from 22 inch mat height with BUE support 2 x 10   -Pt reports increased right knee pain over lateral border of her patella.  Standing LLE Marches on RLE using forearm support on walker 1 x 10   -Pt reports increased pain along gerdy's tubercle of right knee  Palpation: TTP along gerdy's tubercle   Left Side Lying Right IT band stretch 2 x 60 sec    Standing LLE Marches on RLE using forearm support on walker 1 x 10   -Pt reports decreased pain in gerdy's tubercle     MANUAL   HS Stretch on RLE 2 x 60 sec   Hip Adductor Stretch on RLE 2 x 60 sec     PATIENT EDUCATION:  Education details: Explained how lacking 24 degrees of full extension can increase resting patella loading and increase risk of buckling.   Person educated: Patient Education method: collaborative learning, deliberate practice, positive reinforcement, explicit instruction, establish rules. Education comprehension: verbalized understanding, returned demonstration, verbal cues required, and tactile cues required  HOME EXERCISE PROGRAM: Access Code: [email or web address] URL: [email or web address] Date: 12/09/2023 Prepared by: Toribio Servant  Program Notes Seated Towel Slides 3 x 30 x 7 days per week   Exercises - Modified Thomas Stretch  - 1 x daily - 7 x weekly - 3 reps - 60 secsec hold - Sidelying ITB Stretch off Table  - 1 x daily - 7 x weekly - 3 reps - 60 sec hold - Seated Gastroc Stretch with Strap  - 1 x daily - 7 x weekly - 3 reps - 60 sec  hold - Seated Hamstring Stretch  - 1 x daily - 7 x weekly - 3 reps - 60 sec hold - Seated Quadratus Lumborum Stretch in Chair  - 1 x daily - 7 x weekly - 3 reps - 30-60 sec hold - Standing Marching  - 3-4 x weekly - 3 sets - 10 reps - Quarter Squat with Table  - 3-4  x weekly - 3 sets - 10 reps - Sitting Knee Extension with Resistance  - 3-4 x weekly - 3 sets - 10 reps - Hooklying Clamshell with Resistance  - 3-4 x weekly - 3 sets - 10 reps - Seated Hamstring Set  - 3-4 x weekly - 3 sets - 10 reps - 3 sec hold - Seated Quad Set  - 3-4 x weekly - 2 sets - 10 reps - 3 sec hold   ASSESSMENT: CLINICAL IMPRESSION: Pt somewhat limited by ongoing right knee pain. Pain was able to be resolved with IT band stretch as knee pain likely the result of increased muscular tension in right TFL and IT band. Updated home exercise plan to IT band stretch to prevent pain in knee going forward. She will continue to benefit from PT to reduced falls risk, improve walkign tolerance, reduce pain, and reduce caregiver burden.   OBJECTIVE IMPAIRMENTS: decreased ROM, decreased strength, impaired sensation, impaired UE functional use, and pain.   ACTIVITY LIMITATIONS: carrying, lifting, bending, bathing, dressing, reach over head, and hygiene/grooming PARTICIPATION LIMITATIONS: meal prep, cleaning, laundry, shopping, and community activity PERSONAL FACTORS: Age, Education, Sex, Social background, Time since onset of injury/illness/exacerbation, Transportation, and 3+ comorbidities: Cerebral Palsy, HTN, T2DM are also affecting patient's functional outcome.  REHAB POTENTIAL: Fair (timeline since knee surgery) CLINICAL DECISION MAKING: Medium  EVALUATION COMPLEXITY: complicated  GOALS: Goals reviewed with patient? YES  SHORT TERM GOALS: Target date: 11/08/23 Patient will report comprehension, confidence, and consistent compliance and of a simple home exercise program established to facilitate symptoms management and basic strengthening and/or segment mobility.    Baseline: NT 10/15/23: Performing exercises independently    Status: ACHIEVED    Patient to demonstrate improved score on LEFS by >9% to indicate reduced self-reported disability and/or pain.     Baseline: NOT MET      Status: 19/80 or 24%   11/11/23: 20/80 or  25%    LONG TERM GOALS: Target date: 12/09/23  1. Patient to improve score on LEFS by 20% or greater to indicate reduced disability and improved quality of life.    Baseline: 19/80 (24%) 11/11/23: 20/80 or 25% 12/04/23: 25%    Status: ONGOING    2. Pt to report no episodes of knee buckling in most recent 2 weeks to indicate improved stability ogf knee joint loading in ADL performance.   Baseline: twice weekly episodes  11/18/23: Right knee is not giving out but painful.  12/04/23 Not giving out and not pain   Status: ACHIEVED     3. Pt to report worse pain rating in most recent 2 weeks no greater than 6/10 to indicate greater stability of pain symptoms with daily activity/mobility.   Baseline: 8/10 worst pain 11/25/23: 7/10 feels achy now and it used to be sharp   Status: ONGOING   PLAN:  PT FREQUENCY: 1-2x/week PT DURATION: 8 weeks PLANNED INTERVENTIONS: 97164- PT Re-evaluation, 97110-Therapeutic exercises, 97530- Therapeutic activity, 97112- Neuromuscular re-education, 97535- Self Care, [id number]- Manual therapy, [phone]- Gait training, [phone]- Orthotic Fit/training, [phone]- Prosthetic training, [phone]- Aquatic Therapy, [phone]- Electrical stimulation (unattended), [phone]- Electrical stimulation (manual), [id number]- Traction (mechanical), Patient/Family education, Dry Needling, Joint mobilization, Joint manipulation, Spinal manipulation, Spinal mobilization, Cryotherapy, and Moist heat  PLAN FOR NEXT SESSION: Overground ambulation. Modified hip extension and hip abduction.   Toribio Servant PT, DPT  [facility] [address] [place], [address] Phone: [phone]   Fax:  [phone]

## 2023-12-11 ENCOUNTER — Ambulatory Visit: Admitting: Physical Therapy

## 2023-12-11 DIAGNOSIS — M25661 Stiffness of right knee, not elsewhere classified: Secondary | ICD-10-CM | POA: Diagnosis not present

## 2023-12-11 DIAGNOSIS — G8929 Other chronic pain: Secondary | ICD-10-CM

## 2023-12-11 DIAGNOSIS — Z794 Long term (current) use of insulin: Secondary | ICD-10-CM | POA: Diagnosis not present

## 2023-12-11 DIAGNOSIS — E119 Type 2 diabetes mellitus without complications: Secondary | ICD-10-CM | POA: Diagnosis not present

## 2023-12-11 DIAGNOSIS — M25561 Pain in right knee: Secondary | ICD-10-CM | POA: Diagnosis not present

## 2023-12-11 DIAGNOSIS — R262 Difficulty in walking, not elsewhere classified: Secondary | ICD-10-CM | POA: Diagnosis not present

## 2023-12-11 DIAGNOSIS — E1165 Type 2 diabetes mellitus with hyperglycemia: Secondary | ICD-10-CM | POA: Diagnosis not present

## 2023-12-11 NOTE — Therapy (Addendum)
 OUTPATIENT PHYSICAL THERAPY TREATMENT     Patient Name: Traci Davis MRN: [id number] DOB:08-24-1970, 53 y.o., female Today's Date: 12/11/2023  END OF SESSION:  PT End of Session - 12/11/23 1601     Visit Number 14    Number of Visits 20    Date for PT Re-Evaluation 02/13/24    Authorization Type [employer] Medicare Dual (VL based on mn)    Authorization - Visit Number 14    Authorization - Number of Visits 20    Progress Note Due on Visit 20    PT Start Time 1600    PT Stop Time 1645    PT Time Calculation (min) 45 min    Activity Tolerance Patient tolerated treatment well    Behavior During Therapy [facility] for tasks assessed/performed               Past Medical History:  Diagnosis Date   Acute pancreatitis 09/04/2014   Anxiety    Arthritis    Asthma    Cerebral palsy (HCC)    Complication of anesthesia    panic attacks before surgery   COPD (chronic obstructive pulmonary disease) (HCC)    Depression    Diabetes mellitus without complication (HCC)    GERD (gastroesophageal reflux disease)    Hypertension    Noninfectious gastroenteritis and colitis 01/10/2013   Past Surgical History:  Procedure Laterality Date   CESAREAN SECTION  1995   CHOLECYSTECTOMY     DILATATION & CURETTAGE/HYSTEROSCOPY WITH MYOSURE N/A 02/20/2018   Procedure: DILATATION & CURETTAGE/HYSTEROSCOPY WITH MYOSURE ENDOMETRIAL POLYPECTOMY;  Surgeon: Leonce Garnette BIRCH, MD;  Location: [facility];  Service: Gynecology;  Laterality: N/A;   ESOPHAGOGASTRODUODENOSCOPY (EGD) WITH PROPOFOL  N/A 01/01/2019   Procedure: ESOPHAGOGASTRODUODENOSCOPY (EGD) WITH PROPOFOL ;  Surgeon: Unk Corinn Skiff, MD;  Location: [facility];  Service: Gastroenterology;  Laterality: N/A;   FOOT CAPSULE RELEASE W/ PERCUTANEOUS HEEL CORD LENGTHENING, TIBIAL TENDON TRANSFER     age 8 ,and 2010-both legs   JOINT REPLACEMENT Right    both knees partial replacements dates unknown   knee replacment     x 5 per pt. last one- left knee  2014. has had 2 on R 3 on L   TYMPANOPLASTY WITH GRAFT Right 01/08/2018   Procedure: TYMPANOPLASTY WITH POSSIBLE OSSICULAR CHAIN RECONSTRUCTION;  Surgeon: Blair Mt, MD;  Location: [facility];  Service: ENT;  Laterality: Right;   Patient Active Problem List   Diagnosis Date Noted   Urge urinary incontinence 04/02/2023   Incontinence of feces 04/02/2023   Vaginal atrophy 04/02/2023   Asthma, persistent controlled 02/06/2023   Continuous leakage of urine 01/04/2023   De Quervain's tenosynovitis, right 10/08/2022   Hot flashes due to menopause 07/17/2022   Convulsions (HCC) 07/09/2022   Obesity due to excess calories 09/22/2020   Insomnia due to medical condition 05/11/2020   Hamstring tendonitis 01/29/2020   MDD (major depressive disorder), recurrent, in full remission (HCC) 08/12/2019   Seizure-like activity (HCC) 07/08/2019   Mild episode of recurrent major depressive disorder (HCC) 12/29/2018   Insomnia due to mental condition 12/29/2018   Disorder of vein 03/04/2018   Lumbar sprain 03/04/2018   Muscle weakness 03/04/2018   Neck sprain 03/04/2018   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   Postmenopausal bleeding 02/18/2018   Impingement syndrome of shoulder region 02/04/2018   Chest pain with moderate risk for cardiac etiology 05/19/2017   GAD (generalized anxiety disorder) 04/15/2017   Chronic daily headache 04/15/2017   Panic attack 04/15/2017   Nocturnal  enuresis 04/15/2017   Mild cognitive impairment 04/15/2017   Loss of memory 01/14/2017   Pain medication agreement signed 01/08/2017   Headache disorder 12/04/2016   Chronic, continuous use of opioids 07/01/2015   Vertigo 07/01/2015   Chronic midline low back pain without sciatica 03/21/2015   Type 2 diabetes mellitus with other specified complication (HCC) 09/05/2014   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   Hip pain, chronic 04/28/2013   Essential hypertension 01/10/2013   Irritable colon 01/10/2013   Encounter  for long-term (current) use of other medications 12/04/2012   Chronic GERD 08/12/2012   Diarrhea 08/12/2012   Primary localized osteoarthrosis, lower leg 05/07/2012   Difficulty walking 02/06/2012    PCP: Dr. Marsa Officer  REFERRING PROVIDER: Dr. Suzen Brantley Salters  REFERRING DIAG: Cervicalgia  THERAPY DIAG:  Chronic pain of right knee  Difficulty in walking, not elsewhere classified  Stiffness of right knee, not elsewhere classified  Rationale for Evaluation and Treatment: Rehabilitation ONSET DATE: Jan 2025  SUBJECTIVE:                                                                                                                                                                                                        SUBJECTIVE STATEMENT: Pt reports increased pain in her right knee and left posterior forearm after last session. She thinks it is from the exercises we did during the session. Pt also reports having worsening coldness in bilateral feet with the left worse than the right. She has a history of diabetes with blood sugars usually hovering around 250 with spot glucose test.   PERTINENT HISTORY:  Pt reports insidious onset of Rt knee pain and twice weekly buckling episodes starting ~Jan 2025, has constant pain on the femur lateral to the patella. History of remote unicompartmental knee arthroplasty. Pt has not fallen due to this, but she is very concerned about a fall in the future due to her baseline mobility limitations. At eval pain is reported as 6/10 current, 4/10 best in past week, 8/10 worst in past week. Pt manages her pain at home with heat, ice, tylenol  arthritis, and/or percocet.   PAIN:  6-7/10 Right lumbar paraspinal and over into right buttocks   PRECAUTIONS: None WEIGHT BEARING RESTRICTIONS: No FALLS:  Has patient fallen in last 6 months? No  OCCUPATION: On disability  PLOF: Independent  PATIENT GOALS: Decrease pain, avoid falling, abolish  buckling episodes.   OBJECTIVE:  PATIENT SURVEYS:  LEFS: 25% or 20/80 12/04/23   ROM:  -Rt knee flexion: 105 degrees  -Rt knee extension:  24 degrees   STRENGTH ASSESSMENT:  -Rt knee extension: 5/5  -Rt hip extension (straight knee, tested in supine, 30 degrees flexion): 3+/5  TODAY'S TREATMENT   12/11/23: THEREX    Lumbar Flexion AROM with silver  ball flexion center, left and right  2 x 25   Maudsley's: + on LUE Eccentric Left Wrist Ext  #1 DB 1 x 10    -min VC to decrease speed of lower weight into flexion   SELF CARE HOME MANGEMENT    -Modification to home exercise plan exclude sit to stand and mini-squats due to ongoing lateral knee pain  -Education about valgus knee position due to cerebral palsy and increased adductor tone and spasticity pulling knee into valgus resulting in increased approximation of lateral compartment and arthritis.  -Education about need for further orthopedic consult to determine extent of osteoarthritis in lateral compartment of right knee especially given worsening knee pain and having history of bone spurs dating back to 2015 when she last had patellar arthroplasty.     Per Note from Pt's [facility] chart from Dr. Loa Dais on  12/09/2014  History of Present Illness: Shayli is a pleasant 53 yo woman who returns for f/u of bilateral patellofemoral arthroplasties. She reports her pain 7/10 R>L lateral. She has been fitted with a KAFO at the recommendation of her CP doctor. She does find this helpful and improves her stability as it has dropped locks and corrects her extensor deficit. She is ambulating more as she had actually stopped walking completely about 2 years ago. Chronic pain has been an issue for the duration of our relationship with [facility].  She has cerebral palsy who has a long complicated history relating to her left knee. She has had multiple surgical procedures for CP related problems as well as surgeries related to her knee. She underwent left knee  patellofemoral arthroplasty on 04/15/2006 by Dr. Sande for patellofemoral osteoarthritis. She developed difficulty with patellofemoral lateral subluxation and underwent medial patellofemoral ligament reconstruction using tibialis anterior allograft on 03/20/2007 by Dr. Candida and this did seem to improve the situation. The patient had a fall and following had more difficulty with the left knee. It was determined that she had incompetence of the extensor mechanism on the left, so on 02/07/2009 underwent left knee hamstring release with extensor mechanism reconstruction utilizing a full extensor mechanism allograft. She recovered nicely and was able to walk independently though with difficulty due to her spastic dysplegia. She was seen 01/11/2010 after having had another fall and radiographs at that time showed that she had a transverse fracture of the cadaveric patella on the left with displacement. She was treated conservatively and unfortunately this failed and on 03/21/2010 she underwent revision extensor mechanism reconstruction using a new allograft construct. She recovered well from this procedure and was able to walk independently although with difficulty. The right has a patello-femoral arthroplasty as well.   Per last image take of right knee    CLINICAL DATA:  Right knee pain after a fall.    EXAM:  RIGHT KNEE - COMPLETE 4+ VIEW    COMPARISON:  01/13/2013.    FINDINGS:  Postoperative changes are seen in the patella, which appears high  riding, as well as in the distal femur, as before. No superimposed  fracture. Mild degenerative changes are seen in the knee with medial  and lateral compartment osteophytosis. No joint effusion.    IMPRESSION:  1. Postoperative changes in the patella, which appears high riding,  and distal  femur, without joint effusion or fracture.  2. Degenerative changes in the knee.      Electronically Signed    By: Newell Eke M.D.    On: 01/04/2014  13:19   -Identification of orthopedist in surrounding area that may be appropriate for treating a patient, who has history of cerebral palsy and extensive msk history related to byproduct of this disorder:   -Identified Dr. Juliane Raymond and Dr. Carolee from [facility]   -Educated patient on how her elevated blood sugars  could lead to diabetic neuropathy and her symptoms of being cold.    PATIENT EDUCATION:  Education details: Explained how lacking 24 degrees of full extension can increase resting patella loading and increase risk of buckling.   Person educated: Patient Education method: collaborative learning, deliberate practice, positive reinforcement, explicit instruction, establish rules. Education comprehension: verbalized understanding, returned demonstration, verbal cues required, and tactile cues required  HOME EXERCISE PROGRAM: Access Code: [email or web address] URL: [email or web address] Date: 12/11/2023 Prepared by: Toribio Servant  Program Notes Seated Towel Slides 3 x 30 x 7 days per week   Exercises - Modified Thomas Stretch  - 1 x daily - 7 x weekly - 3 reps - 60 secsec hold - Sidelying ITB Stretch off Table  - 1 x daily - 7 x weekly - 3 reps - 60 sec hold - Seated Gastroc Stretch with Strap  - 1 x daily - 7 x weekly - 3 reps - 60 sec  hold - Seated Hamstring Stretch  - 1 x daily - 7 x weekly - 3 reps - 60 sec hold - Seated Quadratus Lumborum Stretch in Chair  - 1 x daily - 7 x weekly - 3 reps - 30-60 sec hold - Seated Eccentric Wrist Extension  - 3-4 x weekly - 3 sets - 10 reps - Standing Marching  - 3-4 x weekly - 3 sets - 10 reps - Sitting Knee Extension with Resistance  - 3-4 x weekly - 3 sets - 10 reps - Hooklying Clamshell with Resistance  - 3-4 x weekly - 3 sets - 10 reps - Seated Hamstring Set  - 3-4 x weekly - 3 sets - 10 reps - 3 sec hold - Seated Quad Set  - 3-4 x weekly - 2 sets - 10 reps - 3 sec hold   ASSESSMENT: CLINICAL IMPRESSION: Pt somewhat limited  by ongoing right knee pain. Pain was able to be resolved with IT band stretch as knee pain likely the result of increased muscular tension in right TFL and IT band. Updated home exercise plan to IT band stretch to prevent pain in knee going forward. She will continue to benefit from PT to reduced falls risk, improve walkign tolerance, reduce pain, and reduce caregiver burden.   OBJECTIVE IMPAIRMENTS: decreased ROM, decreased strength, impaired sensation, impaired UE functional use, and pain.   ACTIVITY LIMITATIONS: carrying, lifting, bending, bathing, dressing, reach over head, and hygiene/grooming PARTICIPATION LIMITATIONS: meal prep, cleaning, laundry, shopping, and community activity PERSONAL FACTORS: Age, Education, Sex, Social background, Time since onset of injury/illness/exacerbation, Transportation, and 3+ comorbidities: Cerebral Palsy, HTN, T2DM are also affecting patient's functional outcome.  REHAB POTENTIAL: Fair (timeline since knee surgery) CLINICAL DECISION MAKING: Medium  EVALUATION COMPLEXITY: complicated    GOALS: Goals reviewed with patient? YES  SHORT TERM GOALS: Target date: 11/08/23 Patient will report comprehension, confidence, and consistent compliance and of a simple home exercise program established to facilitate symptoms management and basic strengthening and/or segment mobility.    Baseline:  NT 10/15/23: Performing exercises independently    Status: ACHIEVED    Patient to demonstrate improved score on LEFS by >9% to indicate reduced self-reported disability and/or pain.     Baseline: NOT MET     Status: 19/80 or 24%   11/11/23: 20/80 or  25%    LONG TERM GOALS: Target date: 02/13/24  1. Patient to improve score on LEFS by 20% or greater to indicate reduced disability and improved quality of life.    Baseline: 19/80 (24%) 11/11/23: 20/80 or 25% 12/04/23: 25%    Status: ONGOING    2. Pt to report no episodes of knee buckling in most recent 2 weeks to indicate improved  stability ogf knee joint loading in ADL performance.   Baseline: twice weekly episodes  11/18/23: Right knee is not giving out but painful.  12/04/23 Not giving out and not pain   Status: ACHIEVED     3. Pt to report worse pain rating in most recent 2 weeks no greater than 6/10 to indicate greater stability of pain symptoms with daily activity/mobility.   Baseline: 8/10 worst pain 11/25/23: 7/10 feels achy now and it used to be sharp   Status: ONGOING   PLAN:  PT FREQUENCY: 1-2x/week PT DURATION: 8 weeks PLANNED INTERVENTIONS: 97164- PT Re-evaluation, 97110-Therapeutic exercises, 97530- Therapeutic activity, 97112- Neuromuscular re-education, 97535- Self Care, [id number]- Manual therapy, [phone]- Gait training, [phone]- Orthotic Fit/training, [phone]- Prosthetic training, [phone]- Aquatic Therapy, [phone]- Electrical stimulation (unattended), [phone]- Electrical stimulation (manual), [id number]- Traction (mechanical), Patient/Family education, Dry Needling, Joint mobilization, Joint manipulation, Spinal manipulation, Spinal mobilization, Cryotherapy, and Moist heat  PLAN FOR NEXT SESSION: Reassess goals and home exercise plan and prepare for potential discharge or transition plan for how to carry over gains and exercises to her home environment.   Toribio Servant PT, DPT  [facility] [address] [place], [address] Phone: [phone]   Fax:  [phone]

## 2023-12-12 ENCOUNTER — Telehealth: Payer: Self-pay | Admitting: Physical Therapy

## 2023-12-12 NOTE — Telephone Encounter (Signed)
 Returned pt's call to discuss options given ongoing pain weakness and to address question about contacting her orthopedist. Referred to pt's prior orthopedic evaluation by Dr. Rockey Fang from [facility] on April 21st.   Per the plan in Dr. Marty note   PLAN: - Urged patient to continue to try to maximize nonsurgical options. We discussed the possibility of medicating with an anxiolytic if she is a suitable candidate for genicular nerve treatments for her pain at the knee itself. Really I think her main issue is instability and altered gait mechanics secondary to her CP. Regarding that, I recommended she reach back out to her PM&R provider to see if there is another type of prosthesis for her right lower extremity that will give her more stability while allowing her the ability to drive. At this time I reiterated that I do not think an elective conversion from partial to total knee arthroplasty would help her at this period of time secondary to the impact of her CP on the postoperative rehabilitation and thus the success of the surgery. I am happy to continue to follow her for what ever she needs.   In light of this information, PT inquired about pt's right knee brace. Pt says that she has the right knee brace but that she does not typically wear it because it is difficult to drive with. PT recommends that pt discuss this information with Dr. Nick, who is the physician managing her physical medicine and rehab needs. Pt agreed to follow up with Dr. Nick to discuss prosthetic options that would work better with driving. Pt instructed to bring in prosthetic in next session to observe how pt is wearing it.

## 2023-12-17 ENCOUNTER — Ambulatory Visit: Admitting: Physical Therapy

## 2023-12-18 ENCOUNTER — Encounter: Payer: Self-pay | Admitting: Family Medicine

## 2023-12-18 ENCOUNTER — Other Ambulatory Visit: Payer: Self-pay | Admitting: Internal Medicine

## 2023-12-18 DIAGNOSIS — E1169 Type 2 diabetes mellitus with other specified complication: Secondary | ICD-10-CM

## 2023-12-19 ENCOUNTER — Other Ambulatory Visit: Payer: Self-pay | Admitting: Internal Medicine

## 2023-12-19 ENCOUNTER — Ambulatory Visit: Admitting: Physical Therapy

## 2023-12-19 DIAGNOSIS — G8929 Other chronic pain: Secondary | ICD-10-CM

## 2023-12-19 DIAGNOSIS — R262 Difficulty in walking, not elsewhere classified: Secondary | ICD-10-CM | POA: Diagnosis not present

## 2023-12-19 DIAGNOSIS — M25561 Pain in right knee: Secondary | ICD-10-CM | POA: Diagnosis not present

## 2023-12-19 DIAGNOSIS — M25661 Stiffness of right knee, not elsewhere classified: Secondary | ICD-10-CM | POA: Diagnosis not present

## 2023-12-19 DIAGNOSIS — E1169 Type 2 diabetes mellitus with other specified complication: Secondary | ICD-10-CM

## 2023-12-19 MED ORDER — LANTUS SOLOSTAR 100 UNIT/ML ~~LOC~~ SOPN: 42.0000 [IU] | PEN_INJECTOR | Freq: Every day | SUBCUTANEOUS | 1 refills | Status: DC

## 2023-12-19 NOTE — Addendum Note (Signed)
 Addended by: EDMAN MARSA PARAS on: 12/19/2023 11:18 AM   Modules accepted: Orders

## 2023-12-19 NOTE — Therapy (Signed)
 OUTPATIENT PHYSICAL THERAPY TREATMENT     Patient Name: Traci Davis MRN: [id number] DOB:[birth year], 53 y.o., female Today's Date: 12/19/2023  END OF SESSION:  PT End of Session - 12/19/23 1528     Visit Number 15    Number of Visits 20    Date for PT Re-Evaluation 02/13/24    Authorization Type UHC Medicare Dual (VL based on mn)    Authorization - Visit Number 15    Authorization - Number of Visits 20    Progress Note Due on Visit 20    PT Start Time 1520    PT Stop Time 1600    PT Time Calculation (min) 40 min    Activity Tolerance Patient tolerated treatment well    Behavior During Therapy [facility] for tasks assessed/performed               Past Medical History:  Diagnosis Date   Acute pancreatitis 09/04/2014   Anxiety    Arthritis    Asthma    Cerebral palsy (HCC)    Complication of anesthesia    panic attacks before surgery   COPD (chronic obstructive pulmonary disease) (HCC)    Depression    Diabetes mellitus without complication (HCC)    GERD (gastroesophageal reflux disease)    Hypertension    Noninfectious gastroenteritis and colitis 01/10/2013   Past Surgical History:  Procedure Laterality Date   CESAREAN SECTION  1995   CHOLECYSTECTOMY     DILATATION & CURETTAGE/HYSTEROSCOPY WITH MYOSURE N/A 02/20/2018   Procedure: DILATATION & CURETTAGE/HYSTEROSCOPY WITH MYOSURE ENDOMETRIAL POLYPECTOMY;  Surgeon: Leonce Garnette BIRCH, MD;  Location: [facility] ORS;  Service: Gynecology;  Laterality: N/A;   ESOPHAGOGASTRODUODENOSCOPY (EGD) WITH PROPOFOL  N/A 01/01/2019   Procedure: ESOPHAGOGASTRODUODENOSCOPY (EGD) WITH PROPOFOL ;  Surgeon: Unk Corinn Skiff, MD;  Location: [facility] ENDOSCOPY;  Service: Gastroenterology;  Laterality: N/A;   FOOT CAPSULE RELEASE W/ PERCUTANEOUS HEEL CORD LENGTHENING, TIBIAL TENDON TRANSFER     age 34 ,and 2010-both legs   JOINT REPLACEMENT Right    both knees partial replacements dates unknown   knee replacment     x 5 per pt. last one- left knee  2014. has had 2 on R 3 on L   TYMPANOPLASTY WITH GRAFT Right 01/08/2018   Procedure: TYMPANOPLASTY WITH POSSIBLE OSSICULAR CHAIN RECONSTRUCTION;  Surgeon: Blair Mt, MD;  Location: [facility] ORS;  Service: ENT;  Laterality: Right;   Patient Active Problem List   Diagnosis Date Noted   Urge urinary incontinence 04/02/2023   Incontinence of feces 04/02/2023   Vaginal atrophy 04/02/2023   Asthma, persistent controlled 02/06/2023   Continuous leakage of urine 01/04/2023   De Quervain's tenosynovitis, right 10/08/2022   Hot flashes due to menopause 07/17/2022   Convulsions (HCC) 07/09/2022   Obesity due to excess calories 09/22/2020   Insomnia due to medical condition 05/11/2020   Hamstring tendonitis 01/29/2020   MDD (major depressive disorder), recurrent, in full remission (HCC) 08/12/2019   Seizure-like activity (HCC) 07/08/2019   Mild episode of recurrent major depressive disorder (HCC) 12/29/2018   Insomnia due to mental condition 12/29/2018   Disorder of vein 03/04/2018   Lumbar sprain 03/04/2018   Muscle weakness 03/04/2018   Neck sprain 03/04/2018   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   Postmenopausal bleeding 02/18/2018   Impingement syndrome of shoulder region 02/04/2018   Chest pain with moderate risk for cardiac etiology 05/19/2017   GAD (generalized anxiety disorder) 04/15/2017   Chronic daily headache 04/15/2017   Panic attack 04/15/2017   Nocturnal  enuresis 04/15/2017   Mild cognitive impairment 04/15/2017   Loss of memory 01/14/2017   Pain medication agreement signed 01/08/2017   Headache disorder 12/04/2016   Chronic, continuous use of opioids 07/01/2015   Vertigo 07/01/2015   Chronic midline low back pain without sciatica 03/21/2015   Type 2 diabetes mellitus with other specified complication (HCC) 09/05/2014   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   Hip pain, chronic 04/28/2013   Essential hypertension 01/10/2013   Irritable colon 01/10/2013   Encounter  for long-term (current) use of other medications 12/04/2012   Chronic GERD 08/12/2012   Diarrhea 08/12/2012   Primary localized osteoarthrosis, lower leg 05/07/2012   Difficulty walking 02/06/2012    PCP: Dr. Marsa Officer  REFERRING PROVIDER: Dr. Suzen Brantley Salters  REFERRING DIAG: Cervicalgia  THERAPY DIAG:  Chronic pain of right knee  Difficulty in walking, not elsewhere classified  Stiffness of right knee, not elsewhere classified  Rationale for Evaluation and Treatment: Rehabilitation ONSET DATE: Jan 2025  SUBJECTIVE:                                                                                                                                                                                                        SUBJECTIVE STATEMENT: Pt states that her right knee continues to lock up and give out on her and that this has been happening with more frequency and especially in the morning.    PERTINENT HISTORY:  Pt reports insidious onset of Rt knee pain and twice weekly buckling episodes starting ~Jan 2025, has constant pain on the femur lateral to the patella. History of remote unicompartmental knee arthroplasty. Pt has not fallen due to this, but she is very concerned about a fall in the future due to her baseline mobility limitations. At eval pain is reported as 6/10 current, 4/10 best in past week, 8/10 worst in past week. Pt manages her pain at home with heat, ice, tylenol  arthritis, and/or percocet.   PAIN:  6-7/10 Right lumbar paraspinal and over into right buttocks   PRECAUTIONS: None WEIGHT BEARING RESTRICTIONS: No FALLS:  Has patient fallen in last 6 months? No  OCCUPATION: On disability  PLOF: Independent  PATIENT GOALS: Decrease pain, avoid falling, abolish buckling episodes.   OBJECTIVE:  PATIENT SURVEYS:  LEFS: 25% or 20/80 12/04/23   ROM:  -Rt knee flexion: 105 degrees  -Rt knee extension: 24 degrees   STRENGTH ASSESSMENT:  -Rt knee  extension: 5/5  -Rt hip extension (straight knee, tested in supine, 30 degrees flexion): 3+/5  TODAY'S TREATMENT   12/19/23:  THEREX   Lumbar Flexion AROM with silver  ball flexion center, left and right  2 x 25   Supine Knee Flexion/Extension AROM with heel on ball (red mini-physioball) 1 x 20   Supine Bridges 1 x 10    Physioball Silver  Lower Trunk Rotations 2 x 10  -min A from PT to push legs over   Sit to Stand with 1 UE support using walker  2 x 10   -Pt reports increased medial knee pain rather than lateral compartment on right knee   Seated Long Arc Quad on RLE  1 x 10  -Pt reports medial knee pain with knee extension   MANUAL   Supine hamstring stretch on RLE 2 x 60 sec    Side Lying Right IT band Stretch on RLE 2 x 60 sec   -Addition of #5 AW for added hip stretch       PATIENT EDUCATION:  Education details: Explained how lacking 24 degrees of full extension can increase resting patella loading and increase risk of buckling.   Person educated: Patient Education method: collaborative learning, deliberate practice, positive reinforcement, explicit instruction, establish rules. Education comprehension: verbalized understanding, returned demonstration, verbal cues required, and tactile cues required  HOME EXERCISE PROGRAM: Access Code: [email or web address] URL: [email or web address] Date: 12/19/2023 Prepared by: Toribio Servant  Program Notes Seated Towel Slides 3 x 30 x 7 days per week   Exercises - Modified Thomas Stretch  - 1 x daily - 7 x weekly - 3 reps - 60 secsec hold - Sidelying ITB Stretch off Table  - 1 x daily - 7 x weekly - 3 reps - 60 sec hold - Seated Gastroc Stretch with Strap  - 1 x daily - 7 x weekly - 3 reps - 60 sec  hold - Seated Hamstring Stretch  - 1 x daily - 7 x weekly - 3 reps - 60 sec hold - Seated Quadratus Lumborum Stretch in Chair  - 1 x daily - 7 x weekly - 3 reps - 30-60 sec hold - Standing Marching  - 3-4 x weekly - 3 sets - 10 reps -  Sitting Knee Extension with Resistance  - 3-4 x weekly - 3 sets - 10 reps - Supine Bridge  - 3-4 x weekly - 3 sets - 10 reps - Hooklying Clamshell with Resistance  - 3-4 x weekly - 3 sets - 10 reps   ASSESSMENT: CLINICAL IMPRESSION: Pt continues to be limited by right knee OA pain with today being medial compartment being more painful than lateral compartment especially in weight bearing. She did not have right KAFO to use during session and she will bring in next session. She continued to complete hip strengthening exercises in supine which she was able to perform without limitations due to pain. She is scheduled to follow up with Dr. Nick to address the need for articulating ankle portion of her knee brace , so that she can plantarflex to drive her car.  She will continue to benefit from PT to reduced falls risk, improve walkign tolerance, reduce pain, and reduce caregiver burden.     OBJECTIVE IMPAIRMENTS: decreased ROM, decreased strength, impaired sensation, impaired UE functional use, and pain.   ACTIVITY LIMITATIONS: carrying, lifting, bending, bathing, dressing, reach over head, and hygiene/grooming PARTICIPATION LIMITATIONS: meal prep, cleaning, laundry, shopping, and community activity PERSONAL FACTORS: Age, Education, Sex, Social background, Time since onset of injury/illness/exacerbation, Transportation, and 3+ comorbidities: Cerebral Palsy, HTN, T2DM are also affecting patient's functional outcome.  REHAB  POTENTIAL: Fair (timeline since knee surgery) CLINICAL DECISION MAKING: Medium  EVALUATION COMPLEXITY: complicated    GOALS: Goals reviewed with patient? YES  SHORT TERM GOALS: Target date: 11/08/23 Patient will report comprehension, confidence, and consistent compliance and of a simple home exercise program established to facilitate symptoms management and basic strengthening and/or segment mobility.    Baseline: NT 10/15/23: Performing exercises independently    Status:  ACHIEVED    Patient to demonstrate improved score on LEFS by >9% to indicate reduced self-reported disability and/or pain.     Baseline: NOT MET     Status: 19/80 or 24%   11/11/23: 20/80 or  25%    LONG TERM GOALS: Target date: 02/13/24  1. Patient to improve score on LEFS by 20% or greater to indicate reduced disability and improved quality of life.    Baseline: 19/80 (24%) 11/11/23: 20/80 or 25% 12/04/23: 25%    Status: ONGOING    2. Pt to report no episodes of knee buckling in most recent 2 weeks to indicate improved stability ogf knee joint loading in ADL performance.   Baseline: twice weekly episodes  11/18/23: Right knee is not giving out but painful.  12/04/23 Not giving out and not pain   Status: ACHIEVED     3. Pt to report worse pain rating in most recent 2 weeks no greater than 6/10 to indicate greater stability of pain symptoms with daily activity/mobility.   Baseline: 8/10 worst pain 11/25/23: 7/10 feels achy now and it used to be sharp   Status: ONGOING   PLAN:  PT FREQUENCY: 1-2x/week PT DURATION: 8 weeks PLANNED INTERVENTIONS: 97164- PT Re-evaluation, 97110-Therapeutic exercises, 97530- Therapeutic activity, 97112- Neuromuscular re-education, 97535- Self Care, 02859- Manual therapy, [phone]- Gait training, [phone]- Orthotic Fit/training, [phone]- Prosthetic training, [phone]- Aquatic Therapy, [phone]- Electrical stimulation (unattended), [phone]- Electrical stimulation (manual), [id number]- Traction (mechanical), Patient/Family education, Dry Needling, Joint mobilization, Joint manipulation, Spinal manipulation, Spinal mobilization, Cryotherapy, and Moist heat  PLAN FOR NEXT SESSION: Warm up with lumbar flexion ball rolls with physioball.  Continue with knee strengthening within pain tolerance: sit to stand from raises surface. Standing marches. If pain is too high in her knee then go to supine hip strengthening exercises: straight leg raise and supine clam shells.  Toribio Servant PT, DPT  [facility] [address], [place], [address] Phone: [phone]   Fax:  [phone]

## 2023-12-20 ENCOUNTER — Telehealth: Payer: Self-pay

## 2023-12-20 DIAGNOSIS — E1169 Type 2 diabetes mellitus with other specified complication: Secondary | ICD-10-CM

## 2023-12-20 MED ORDER — LANTUS SOLOSTAR 100 UNIT/ML ~~LOC~~ SOPN: 42.0000 [IU] | PEN_INJECTOR | Freq: Every day | SUBCUTANEOUS | 1 refills | Status: DC

## 2023-12-20 NOTE — Telephone Encounter (Signed)
Rx resent to [facility].

## 2023-12-20 NOTE — Telephone Encounter (Signed)
 Copied from CRM #[id number]. Topic: Clinical - Prescription Issue >> Dec 20, 2023  2:36 PM Debby BROCKS wrote: Reason for CRM: Mandie from [facility] states that Nichole Molly [facility] sent over the refill for the LANTUS  SOLOSTAR 100 UNIT/ML Solostar Pen but they are unable to process and would like to speak with someone  Callback: [phone]

## 2023-12-20 NOTE — Telephone Encounter (Signed)
 Rx 12/19/23 38ml 1RF- duplicate request Requested Prescriptions  Pending Prescriptions Disp Refills   LANTUS  SOLOSTAR 100 UNIT/ML Solostar Pen [Pharmacy Med Name: LANTUS  SOLOSTAR 100 UNIT/ML SUBQ SO] 30 mL     Sig: INJECT 34 UNITS SUBCUTANEOUSLY ONCE DAILY     Endocrinology:  Diabetes - Insulins Failed - 12/20/2023 12:44 PM      Failed - HBA1C is between 0 and 7.9 and within 180 days    Hemoglobin A1C  Date Value Ref Range Status  01/22/2023 8  Final   Hgb A1c MFr Bld  Date Value Ref Range Status  09/04/2023 9.6 (H) <5.7 % Final    Comment:    For someone without known diabetes, a hemoglobin A1c value of 6.5% or greater indicates that they may have  diabetes and this should be confirmed with a follow-up  test. . For someone with known diabetes, a value <7% indicates  that their diabetes is well controlled and a value  greater than or equal to 7% indicates suboptimal  control. A1c targets should be individualized based on  duration of diabetes, age, comorbid conditions, and  other considerations. . Currently, no consensus exists regarding use of hemoglobin A1c for diagnosis of diabetes for children. SABRA Amy - Valid encounter within last 6 months    Recent Outpatient Visits           2 months ago 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[facility])   [place] [facility] [place], Marsa PARAS, DO   3 months ago COVID-19   [facility] [place], Marsa PARAS, DO   3 months ago Exposure to confirmed case of COVID-19   [facility] [place], Angeline ORN, [place]

## 2023-12-24 ENCOUNTER — Ambulatory Visit (INDEPENDENT_AMBULATORY_CARE_PROVIDER_SITE_OTHER): Admitting: Family Medicine

## 2023-12-24 ENCOUNTER — Encounter: Admitting: Physical Therapy

## 2023-12-24 ENCOUNTER — Encounter: Payer: Self-pay | Admitting: Family Medicine

## 2023-12-24 VITALS — BP 130/80 | HR 86 | Ht 62.0 in | Wt 210.1 lb

## 2023-12-24 DIAGNOSIS — R600 Localized edema: Secondary | ICD-10-CM | POA: Diagnosis not present

## 2023-12-24 DIAGNOSIS — G801 Spastic diplegic cerebral palsy: Secondary | ICD-10-CM | POA: Diagnosis not present

## 2023-12-24 DIAGNOSIS — Z794 Long term (current) use of insulin: Secondary | ICD-10-CM

## 2023-12-24 DIAGNOSIS — Z1231 Encounter for screening mammogram for malignant neoplasm of breast: Secondary | ICD-10-CM

## 2023-12-24 DIAGNOSIS — E1169 Type 2 diabetes mellitus with other specified complication: Secondary | ICD-10-CM | POA: Diagnosis not present

## 2023-12-24 DIAGNOSIS — J3089 Other allergic rhinitis: Secondary | ICD-10-CM

## 2023-12-24 DIAGNOSIS — B372 Candidiasis of skin and nail: Secondary | ICD-10-CM

## 2023-12-24 MED ORDER — FLUTICASONE PROPIONATE 50 MCG/ACT NA SUSP: 2.0000 | Freq: Every day | NASAL | 3 refills | Status: AC

## 2023-12-24 MED ORDER — NYSTATIN 100000 UNIT/GM EX POWD: 1.0000 | Freq: Three times a day (TID) | CUTANEOUS | 3 refills | Status: DC

## 2023-12-24 NOTE — Progress Notes (Signed)
 Subjective:    Patient ID: Traci Davis, female    DOB: Apr 30, 1971, 53 y.o.   MRN: [id number]  Traci Davis is a 53 y.o. female presenting on 12/24/2023 for Medical Management of Chronic Issues and Diabetes   HPI  Discussed the use of AI scribe software for clinical note transcription with the patient, who gave verbal consent to proceed.  History of Present Illness   Traci Davis is a 53 year old female who presents with concerns about circulation issues in her legs.  Lower extremity sensory disturbance and edema - Reduced sensation in both legs with difficulty assessing overall feeling, but intact perception of physical contact - Swelling present from the knees downward, partially responsive to leg elevation - No pain or cramping with ambulation - No visible varicose veins - Family history of varicose veins in grandmother - Concern about circulation due to a friend's history of stroke from popliteal blockages  Cerebral Palsy Spastic Diplegic  Lower extremity motor dysfunction and gait abnormality - History of foot numbness and weakness - Foot drop present, causing toes to turn under during ambulation - Episodes of foot getting stuck, such as in the bathroom, resulting in pain and difficulty moving  Knee structural abnormality and instability - Bone spurs in the knee present for at least one year - Knee collapse episodes contributing to mobility impairment - Currently undergoing physical therapy for leg and knee issues  Diabetes mellitus management - Uses Dexcom device for glucose monitoring - Dexcom readings approximately 40 points higher than finger stick tests - Blood glucose rises significantly before or after supper - Occasionally only consumes a Gluconis between breakfast and supper  Candidal Intertrigo - Uses nystatin  powder three times daily as needed, especially during hot weather for moisture-related skin issues - Uses Flonase  and requests refills for both  nystatin  powder and Flonase         Health Maintenance:  Recommend 2nd dose Shingrix vaccine, anytime at pharmacy, initial dose 2022  Referral to [facility] for Mammogram.     02/27/2023    4:52 PM 12/06/2022    3:34 PM 04/05/2022    4:17 PM  Depression screen PHQ 2/9  Decreased Interest 0    Down, Depressed, Hopeless 0    PHQ - 2 Score 0    Altered sleeping     Tired, decreased energy     Change in appetite     Feeling bad or failure about yourself      Trouble concentrating     Moving slowly or fidgety/restless     Suicidal thoughts     PHQ-9 Score        Information is confidential and restricted. Go to Review Flowsheets to unlock data.       12/06/2022    3:34 PM 04/05/2022    4:18 PM 06/06/2021    4:51 PM  GAD 7 : Generalized Anxiety Score  Nervous, Anxious, on Edge     Control/stop worrying     Worry too much - different things     Trouble relaxing     Restless     Easily annoyed or irritable     Afraid - awful might happen     Total GAD 7 Score     Anxiety Difficulty        Information is confidential and restricted. Go to Review Flowsheets to unlock data.    Social History   Tobacco Use   Smoking status: Never   Smokeless  tobacco: Never  Vaping Use   Vaping status: Never Used  Substance Use Topics   Alcohol use: No   Drug use: No    Review of Systems Per HPI unless specifically indicated above     Objective:    BP 130/80 (BP Location: Left Arm, Cuff Size: Normal)   Pulse 86   Ht 5' 2 (1.575 m)   Wt 210 lb 2 oz (95.3 kg)   LMP 08/20/2014 Comment: missed cup when trying to obtain urine spec  SpO2 94%   BMI 38.43 kg/m   Wt Readings from Last 3 Encounters:  12/24/23 210 lb 2 oz (95.3 kg)  10/07/23 207 lb 6 oz (94.1 kg)  09/16/23 207 lb (93.9 kg)    Physical Exam Vitals and nursing note reviewed.  Constitutional:      General: She is not in acute distress.    Appearance: Normal appearance. She is well-developed. She is  obese. She is not diaphoretic.     Comments: Well-appearing, comfortable, cooperative  HENT:     Head: Normocephalic and atraumatic.  Eyes:     General:        Right eye: No discharge.        Left eye: No discharge.     Conjunctiva/sclera: Conjunctivae normal.  Cardiovascular:     Rate and Rhythm: Normal rate.  Pulmonary:     Effort: Pulmonary effort is normal.  Abdominal:     General: Bowel sounds are normal.     Palpations: There is no mass.     Tenderness: There is no guarding.  Musculoskeletal:     Comments: Bilateral lower extremity reduced mobility with CP chronic wearing lower extremity hinge brace, has under developed muscles on left lower extremity.  Rolling walker  Skin:    General: Skin is warm and dry.     Findings: No erythema or rash.  Neurological:     Mental Status: She is alert and oriented to person, place, and time.  Psychiatric:        Mood and Affect: Mood normal.        Behavior: Behavior normal.        Thought Content: Thought content normal.     Comments: Well groomed, good eye contact, normal speech and thoughts     Diabetic Foot Exam - Simple   Simple Foot Form Diabetic Foot exam was performed with the following findings: Yes 12/24/2023  3:25 PM  Visual Inspection See comments: Yes Sensation Testing See comments: Yes Pulse Check Posterior Tibialis and Dorsalis pulse intact bilaterally: Yes Comments Has foot drop bilateral and Left foot weak with cerebral palsy spastic. No ulceration      Results for orders placed or performed in visit on 10/01/23  HM DIABETES EYE EXAM   Collection Time: 09/24/23 11:08 AM  Result Value Ref Range   HM Diabetic Eye Exam No Retinopathy No Retinopathy      Assessment & Plan:   Problem List Items Addressed This Visit     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(HCC) - Primary   Relevant Orders   Ambulatory referral to Vascular Surgery   Type 2 diabetes mellitus with other specified complication (HCC)   Other  Visit Diagnoses       Bilateral edema of lower extremity       Relevant Orders   Ambulatory referral to Vascular Surgery     Encounter for screening mammogram for malignant neoplasm of breast       Relevant Orders  MM 3D SCREENING MAMMOGRAM BILATERAL BREAST     Candidal intertrigo       Relevant Medications   nystatin  powder     Seasonal allergic rhinitis due to other allergic trigger       Relevant Medications   fluticasone  (FLONASE ) 50 MCG/ACT nasal spray     Long term current use of insulin  (HCC)            Lower extremity edema and reduced sensation Chronic edema and reduced sensation suggest venous insufficiency. No varicose veins. Circulation concerns due to friend's vascular issues. - Refer to vascular specialists at [facility] for venous and arterial evaluation. - Advise compression and elevation of legs. Left side will likely have increased Edema due to her Cerebral Palsy  Right knee osteophyte and right lower extremity foot drop Chronic knee osteophyte causing instability and foot drop. Bone spurs on MRI. Undergoing physical therapy. - Consider new brace for support.  Type 2 diabetes mellitus Type 2 diabetes with blood glucose monitoring discrepancies. Dexcom sensor shows 40-point difference from finger stick readings. Reports high blood sugar before and after supper. Discussed that CGM reading is 15-20 min delayed compared to more instant CBG result to help explain - Discuss discrepancies with endocrinologist on September 26. - Repeat A1c at next endocrinology visit.  Hypertension Hypertension generally well-controlled, recent elevation due to stress and physical activity.  Candidal intertrigo Recurrent candidal intertrigo exacerbated by heat and moisture. - Refill nystatin  powder prescription and send to [facility] pharmacy.  Allergic rhinitis Chronic allergic rhinitis managed with Flonase . - Refill Flonase  prescription and send to [facility] pharmacy.  General  Health Maintenance Due for routine screenings and vaccinations. Last mammogram two years ago. Shingles vaccination series incomplete. Flu shot pending. - Order mammogram at [facility]. - Recommend second dose of shingles vaccine at pharmacy. - Advise flu vaccination when available.  Future cardiovascular assessment Consider heart scan due to family history of heart disease. Discussed optional CT scan for arterial assessment. - Discuss potential heart scan for future consideration.       Orders Placed This Encounter  Procedures   MM 3D SCREENING MAMMOGRAM BILATERAL BREAST    Standing Status:   Future    Expiration Date:   12/23/2024    Scheduling Instructions:     Requesting [facility]    Reason for Exam (SYMPTOM  OR DIAGNOSIS REQUIRED):   Screening bilateral 3D Mammogram Tomo    Preferred imaging location?:   External   Ambulatory referral to Vascular Surgery    Referral Priority:   Routine    Referral Type:   Surgical    Referral Reason:   Specialty Services Required    Requested Specialty:   Vascular Surgery    Number of Visits Requested:   1    Meds ordered this encounter  Medications   nystatin  powder    Sig: Apply 1 Application topically 3 (three) times daily. Until rash resolved, repeat use for another flare if needed    Dispense:  30 g    Refill:  3   fluticasone  (FLONASE ) 50 MCG/ACT nasal spray    Sig: Place 2 sprays into both nostrils daily.    Dispense:  48 g    Refill:  3    Follow up plan: Return in about 5 months (around 05/25/2024) for 5 month follow-up DM Endocine, Cerebral Palsy updates.   Marsa Officer, DO [facility] 12/24/2023, 3:17 PM

## 2023-12-24 NOTE — Patient Instructions (Addendum)
 Thank you for coming to the office today.  Recent Labs    01/22/23 0000 05/21/23 1525 09/04/23 1257  HGBA1C 8 8.0* 9.6*   [facility] next 01/27/2024  2:30 PM   Referral to Vascular doctors in [facility] - by the hospital.  VASCULAR SURGERY  [facility] [address] [place],  [place]  [address] Main: [phone]  Mammogram ordered Call to schedule if they do not call you within a week or two  [address], [place], [place] [address] Phone: [phone]  Recommend 2nd dose Shingles vaccine at the pharmacy, free covered.  Nystatin  powder and Flonase  sent to [facility]  Future consideration Coronary Calcium  Score Cardiac CT Scan. This is a screening test for patients aged 16-50+ with cardiovascular risk factors or who are healthy but would be interested in Cardiovascular Screening for heart disease. Even if there is a family history of heart disease, this imaging can be useful. Typically it can be done every 5+ years or at a different timeline we agree on  The scan will look at the chest and mainly focus on the heart and identify early signs of calcium  build up or blockages within the heart arteries. It is not 100% accurate for identifying blockages or heart disease, but it is useful to help us  predict who may have some early changes or be at risk in the future for a heart attack or cardiovascular problem.  The results are reviewed by a Cardiologist and they will document the results. It should become available on MyChart. Typically the results are divided into percentiles based on other patients of the same demographic and age. So it will compare your risk to others similar to you. If you have a higher score >99 or higher percentile >75%tile, it is recommended to consider Statin cholesterol therapy and or referral to Cardiologist. I will try to help explain your results and if we have questions we can contact the  Cardiologist.  You will be contacted for scheduling. Usually it is done at any imaging facility through [facility], [facility] or [facility].  The cost is $99 flat fee total and it does not go through insurance, so no authorization is required.   Please schedule a Follow-up Appointment to: Return in about 5 months (around 05/25/2024) for 5 month follow-up DM Endocine, Cerebral Palsy updates.  If you have any other questions or concerns, please feel free to call the office or send a message through MyChart. You may also schedule an earlier appointment if necessary.  Additionally, you may be receiving a survey about your experience at our office within a few days to 1 week by e-mail or mail. We value your feedback.  Marsa Officer, DO [facility], [place]

## 2023-12-25 NOTE — Therapy (Incomplete)
 OUTPATIENT PHYSICAL THERAPY TREATMENT     Patient Name: Traci Davis MRN: [id number] DOB:12-17-1970, 53 y.o., female Today's Date: 12/25/2023  END OF SESSION:         Past Medical History:  Diagnosis Date   Acute pancreatitis 09/04/2014   Anxiety    Arthritis    Asthma    Cerebral palsy (HCC)    Complication of anesthesia    panic attacks before surgery   COPD (chronic obstructive pulmonary disease) (HCC)    Depression    Diabetes mellitus without complication (HCC)    GERD (gastroesophageal reflux disease)    Hypertension    Noninfectious gastroenteritis and colitis 01/10/2013   Past Surgical History:  Procedure Laterality Date   CESAREAN SECTION  1995   CHOLECYSTECTOMY     DILATATION & CURETTAGE/HYSTEROSCOPY WITH MYOSURE N/A 02/20/2018   Procedure: DILATATION & CURETTAGE/HYSTEROSCOPY WITH MYOSURE ENDOMETRIAL POLYPECTOMY;  Surgeon: Leonce Garnette BIRCH, MD;  Location: [facility] OR[facility];  Service: Gynecology;  Laterality: N/A;   ESOPHAGOGASTRODUODENOSCOPY (EGD) WITH PROPOFOL  N/A 01/01/2019   Procedure: ESOPHAGOGASTRODUODENOSCOPY (EGD) WITH PROPOFOL ;  Surgeon: Unk Corinn Skiff, MD;  Location: [facility] END[facility];  Service: Gastroenterology;  Laterality: N/A;   FOOT CAPSULE RELEASE W/ PERCUTANEOUS HEEL CORD LENGTHENING, TIBIAL TENDON TRANSFER     age 53 ,and 2010-both legs   JOINT REPLACEMENT Right    both knees partial replacements dates unknown   knee replacment     x 5 per pt. last one- left knee 2014. has had 2 on R 3 on L   TYMPANOPLASTY WITH GRAFT Right 01/08/2018   Procedure: TYMPANOPLASTY WITH POSSIBLE OSSICULAR CHAIN RECONSTRUCTION;  Surgeon: Blair Mt, MD;  Location: [facility] OR[facility];  Service: ENT;  Laterality: Right;   Patient Active Problem List   Diagnosis Date Noted   Urge urinary incontinence 04/02/2023   Incontinence of feces 04/02/2023   Vaginal atrophy 04/02/2023   Asthma, persistent controlled 02/06/2023   Continuous leakage of urine 01/04/2023   De Quervain's  tenosynovitis, right 10/08/2022   Hot flashes due to menopause 07/17/2022   Convulsions (HCC) 07/09/2022   Obesity due to excess calories 09/22/2020   Insomnia due to medical condition 05/11/2020   Hamstring tendonitis 01/29/2020   MDD (major depressive disorder), recurrent, in full remission (HCC) 08/12/2019   Seizure-like activity (HCC) 07/08/2019   Mild episode of recurrent major depressive disorder (HCC) 12/29/2018   Insomnia due to mental condition 12/29/2018   Disorder of vein 03/04/2018   Lumbar sprain 03/04/2018   Muscle weakness 03/04/2018   Neck sprain 03/04/2018   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   Postmenopausal bleeding 02/18/2018   Impingement syndrome of shoulder region 02/04/2018   Chest pain with moderate risk for cardiac etiology 05/19/2017   GAD (generalized anxiety disorder) 04/15/2017   Chronic daily headache 04/15/2017   Panic attack 04/15/2017   Nocturnal enuresis 04/15/2017   Mild cognitive impairment 04/15/2017   Loss of memory 01/14/2017   Pain medication agreement signed 01/08/2017   Headache disorder 12/04/2016   Chronic, continuous use of opioids 07/01/2015   Vertigo 07/01/2015   Chronic midline low back pain without sciatica 03/21/2015   Type 2 diabetes mellitus with other specified complication (HCC) 09/05/2014   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   Hip pain, chronic 04/28/2013   Essential hypertension 01/10/2013   Irritable colon 01/10/2013   Encounter for long-term (current) use of other medications 12/04/2012   Chronic GERD 08/12/2012   Diarrhea 08/12/2012   Primary localized osteoarthrosis, lower leg 05/07/2012   Difficulty walking  02/06/2012    PCP: Dr. Marsa Officer  REFERRING PROVIDER: Dr. Suzen Brantley Salters  REFERRING DIAG: Cervicalgia  THERAPY DIAG:  Chronic pain of right knee  Difficulty in walking, not elsewhere classified  Stiffness of right knee, not elsewhere classified  Rationale for Evaluation and  Treatment: Rehabilitation ONSET DATE: Jan 2025  SUBJECTIVE:                                                                                                                                                                                                        SUBJECTIVE STATEMENT: ***  Pt states that her right knee continues to lock up and give out on her and that this has been happening with more frequency and especially in the morning.    PERTINENT HISTORY:  Pt reports insidious onset of Rt knee pain and twice weekly buckling episodes starting ~Jan 2025, has constant pain on the femur lateral to the patella. History of remote unicompartmental knee arthroplasty. Pt has not fallen due to this, but she is very concerned about a fall in the future due to her baseline mobility limitations. At eval pain is reported as 6/10 current, 4/10 best in past week, 8/10 worst in past week. Pt manages her pain at home with heat, ice, tylenol  arthritis, and/or percocet.   PAIN:  6-7/10 Right lumbar paraspinal and over into right buttocks   PRECAUTIONS: None WEIGHT BEARING RESTRICTIONS: No FALLS:  Has patient fallen in last 6 months? No  OCCUPATION: On disability  PLOF: Independent  PATIENT GOALS: Decrease pain, avoid falling, abolish buckling episodes.   OBJECTIVE:  PATIENT SURVEYS:  LEFS: 25% or 20/80 12/04/23   ROM:  -Rt knee flexion: 105 degrees  -Rt knee extension: 24 degrees   STRENGTH ASSESSMENT:  -Rt knee extension: 5/5  -Rt hip extension (straight knee, tested in supine, 30 degrees flexion): 3+/5  TODAY'S TREATMENT: 12/25/23  ***  12/19/23: THEREX   Lumbar Flexion AROM with silver  ball flexion center, left and right  2 x 25   Supine Knee Flexion/Extension AROM with heel on ball (red mini-physioball) 1 x 20   Supine Bridges 1 x 10    Physioball Silver  Lower Trunk Rotations 2 x 10  -min A from PT to push legs over   Sit to Stand with 1 UE support using Annesha Delgreco  2 x 10   -Pt reports  increased medial knee pain rather than lateral compartment on right knee   Seated Long Arc Quad on RLE  1 x 10  -Pt reports medial knee pain with knee extension  MANUAL   Supine hamstring stretch on RLE 2 x 60 sec    Side Lying Right IT band Stretch on RLE 2 x 60 sec   -Addition of #5 AW for added hip stretch       PATIENT EDUCATION:  Education details: Explained how lacking 24 degrees of full extension can increase resting patella loading and increase risk of buckling.   Person educated: Patient Education method: collaborative learning, deliberate practice, positive reinforcement, explicit instruction, establish rules. Education comprehension: verbalized understanding, returned demonstration, verbal cues required, and tactile cues required  HOME EXERCISE PROGRAM: Access Code: [email or web address] URL: [email or web address] Date: 12/19/2023 Prepared by: Toribio Servant  Program Notes Seated Towel Slides 3 x 30 x 7 days per week   Exercises - Modified Thomas Stretch  - 1 x daily - 7 x weekly - 3 reps - 60 secsec hold - Sidelying ITB Stretch off Table  - 1 x daily - 7 x weekly - 3 reps - 60 sec hold - Seated Gastroc Stretch with Strap  - 1 x daily - 7 x weekly - 3 reps - 60 sec  hold - Seated Hamstring Stretch  - 1 x daily - 7 x weekly - 3 reps - 60 sec hold - Seated Quadratus Lumborum Stretch in Chair  - 1 x daily - 7 x weekly - 3 reps - 30-60 sec hold - Standing Marching  - 3-4 x weekly - 3 sets - 10 reps - Sitting Knee Extension with Resistance  - 3-4 x weekly - 3 sets - 10 reps - Supine Bridge  - 3-4 x weekly - 3 sets - 10 reps - Hooklying Clamshell with Resistance  - 3-4 x weekly - 3 sets - 10 reps   ASSESSMENT: CLINICAL IMPRESSION: ***  Pt continues to be limited by right knee OA pain with today being medial compartment being more painful than lateral compartment especially in weight bearing. She did not have right KAFO to use during session and she will bring in next  session. She continued to complete hip strengthening exercises in supine which she was able to perform without limitations due to pain. She is scheduled to follow up with Dr. Nick to address the need for articulating ankle portion of her knee brace , so that she can plantarflex to drive her car.  She will continue to benefit from PT to reduced falls risk, improve walkign tolerance, reduce pain, and reduce caregiver burden.     OBJECTIVE IMPAIRMENTS: decreased ROM, decreased strength, impaired sensation, impaired UE functional use, and pain.   ACTIVITY LIMITATIONS: carrying, lifting, bending, bathing, dressing, reach over head, and hygiene/grooming PARTICIPATION LIMITATIONS: meal prep, cleaning, laundry, shopping, and community activity PERSONAL FACTORS: Age, Education, Sex, Social background, Time since onset of injury/illness/exacerbation, Transportation, and 3+ comorbidities: Cerebral Palsy, HTN, T2DM are also affecting patient's functional outcome.  REHAB POTENTIAL: Fair (timeline since knee surgery) CLINICAL DECISION MAKING: Medium  EVALUATION COMPLEXITY: complicated    GOALS: Goals reviewed with patient? YES  SHORT TERM GOALS: Target date: 11/08/23 Patient will report comprehension, confidence, and consistent compliance and of a simple home exercise program established to facilitate symptoms management and basic strengthening and/or segment mobility.    Baseline: NT 10/15/23: Performing exercises independently    Status: ACHIEVED    Patient to demonstrate improved score on LEFS by >9% to indicate reduced self-reported disability and/or pain.     Baseline: NOT MET     Status: 19/80 or 24%   11/11/23: 20/80 or  25%  LONG TERM GOALS: Target date: 02/13/24  1. Patient to improve score on LEFS by 20% or greater to indicate reduced disability and improved quality of life.    Baseline: 19/80 (24%) 11/11/23: 20/80 or 25% 12/04/23: 25%    Status: ONGOING    2. Pt to report no episodes of knee  buckling in most recent 2 weeks to indicate improved stability ogf knee joint loading in ADL performance.   Baseline: twice weekly episodes  11/18/23: Right knee is not giving out but painful.  12/04/23 Not giving out and not pain   Status: ACHIEVED     3. Pt to report worse pain rating in most recent 2 weeks no greater than 6/10 to indicate greater stability of pain symptoms with daily activity/mobility.   Baseline: 8/10 worst pain 11/25/23: 7/10 feels achy now and it used to be sharp   Status: ONGOING   PLAN:  PT FREQUENCY: 1-2x/week PT DURATION: 8 weeks PLANNED INTERVENTIONS: 97164- PT Re-evaluation, 97110-Therapeutic exercises, 97530- Therapeutic activity, 97112- Neuromuscular re-education, 97535- Self Care, [id number]- Manual therapy, [phone]- Gait training, [phone]- Orthotic Fit/training, [phone]- Prosthetic training, [phone]- Aquatic Therapy, [phone]- Electrical stimulation (unattended), [phone]- Electrical stimulation (manual), [id number]- Traction (mechanical), Patient/Family education, Dry Needling, Joint mobilization, Joint manipulation, Spinal manipulation, Spinal mobilization, Cryotherapy, and Moist heat  PLAN FOR NEXT SESSION: Warm up with lumbar flexion ball rolls with physioball.  Continue with knee strengthening within pain tolerance: sit to stand from raises surface. Standing marches. If pain is too high in her knee then go to supine hip strengthening exercises: straight leg raise and supine clam shells.  Maryanne Finder, PT, DPT Physical Therapist - [place]  [facility]

## 2023-12-26 ENCOUNTER — Encounter: Admitting: Physical Therapy

## 2023-12-26 ENCOUNTER — Ambulatory Visit: Admitting: Physical Therapy

## 2023-12-26 DIAGNOSIS — G8929 Other chronic pain: Secondary | ICD-10-CM

## 2023-12-26 DIAGNOSIS — R262 Difficulty in walking, not elsewhere classified: Secondary | ICD-10-CM

## 2023-12-26 DIAGNOSIS — M25661 Stiffness of right knee, not elsewhere classified: Secondary | ICD-10-CM

## 2023-12-31 ENCOUNTER — Ambulatory Visit: Attending: Physical Medicine and Rehabilitation | Admitting: Physical Therapy

## 2023-12-31 DIAGNOSIS — R262 Difficulty in walking, not elsewhere classified: Secondary | ICD-10-CM | POA: Diagnosis not present

## 2023-12-31 DIAGNOSIS — M25561 Pain in right knee: Secondary | ICD-10-CM | POA: Diagnosis not present

## 2023-12-31 DIAGNOSIS — M25661 Stiffness of right knee, not elsewhere classified: Secondary | ICD-10-CM | POA: Insufficient documentation

## 2023-12-31 DIAGNOSIS — G8929 Other chronic pain: Secondary | ICD-10-CM | POA: Diagnosis not present

## 2023-12-31 NOTE — Therapy (Signed)
 OUTPATIENT PHYSICAL THERAPY TREATMENT     Patient Name: Traci Davis MRN: [id number] DOB:Feb 10, 1971, 53 y.o., female Today's Date: 12/31/2023  END OF SESSION:  PT End of Session - 12/31/23 1741     Visit Number 16    Number of Visits 20    Date for PT Re-Evaluation 02/13/24    Authorization Type UHC Medicare Dual (VL based on mn)    Authorization - Visit Number 16    Authorization - Number of Visits 20    Progress Note Due on Visit 20    PT Start Time 1730    PT Stop Time 1815    PT Time Calculation (min) 45 min    Activity Tolerance Patient tolerated treatment well    Behavior During Therapy [facility] for tasks assessed/performed                Past Medical History:  Diagnosis Date   Acute pancreatitis 09/04/2014   Anxiety    Arthritis    Asthma    Cerebral palsy (HCC)    Complication of anesthesia    panic attacks before surgery   COPD (chronic obstructive pulmonary disease) (HCC)    Depression    Diabetes mellitus without complication (HCC)    GERD (gastroesophageal reflux disease)    Hypertension    Noninfectious gastroenteritis and colitis 01/10/2013   Past Surgical History:  Procedure Laterality Date   CESAREAN SECTION  1995   CHOLECYSTECTOMY     DILATATION & CURETTAGE/HYSTEROSCOPY WITH MYOSURE N/A 02/20/2018   Procedure: DILATATION & CURETTAGE/HYSTEROSCOPY WITH MYOSURE ENDOMETRIAL POLYPECTOMY;  Surgeon: Leonce Garnette BIRCH, MD;  Location: [facility] ORS;  Service: Gynecology;  Laterality: N/A;   ESOPHAGOGASTRODUODENOSCOPY (EGD) WITH PROPOFOL  N/A 01/01/2019   Procedure: ESOPHAGOGASTRODUODENOSCOPY (EGD) WITH PROPOFOL ;  Surgeon: Unk Corinn Skiff, MD;  Location: [facility] ENDOSCOPY;  Service: Gastroenterology;  Laterality: N/A;   FOOT CAPSULE RELEASE W/ PERCUTANEOUS HEEL CORD LENGTHENING, TIBIAL TENDON TRANSFER     age 77 ,and 2010-both legs   JOINT REPLACEMENT Right    both knees partial replacements dates unknown   knee replacment     x 5 per pt. last one- left knee  2014. has had 2 on R 3 on L   TYMPANOPLASTY WITH GRAFT Right 01/08/2018   Procedure: TYMPANOPLASTY WITH POSSIBLE OSSICULAR CHAIN RECONSTRUCTION;  Surgeon: Blair Mt, MD;  Location: [facility] ORS;  Service: ENT;  Laterality: Right;   Patient Active Problem List   Diagnosis Date Noted   Urge urinary incontinence 04/02/2023   Incontinence of feces 04/02/2023   Vaginal atrophy 04/02/2023   Asthma, persistent controlled 02/06/2023   Continuous leakage of urine 01/04/2023   De Quervain's tenosynovitis, right 10/08/2022   Hot flashes due to menopause 07/17/2022   Convulsions (HCC) 07/09/2022   Obesity due to excess calories 09/22/2020   Insomnia due to medical condition 05/11/2020   Hamstring tendonitis 01/29/2020   MDD (major depressive disorder), recurrent, in full remission (HCC) 08/12/2019   Seizure-like activity (HCC) 07/08/2019   Mild episode of recurrent major depressive disorder (HCC) 12/29/2018   Insomnia due to mental condition 12/29/2018   Disorder of vein 03/04/2018   Lumbar sprain 03/04/2018   Muscle weakness 03/04/2018   Neck sprain 03/04/2018   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   Postmenopausal bleeding 02/18/2018   Impingement syndrome of shoulder region 02/04/2018   Chest pain with moderate risk for cardiac etiology 05/19/2017   GAD (generalized anxiety disorder) 04/15/2017   Chronic daily headache 04/15/2017   Panic attack 04/15/2017  Nocturnal enuresis 04/15/2017   Mild cognitive impairment 04/15/2017   Loss of memory 01/14/2017   Pain medication agreement signed 01/08/2017   Headache disorder 12/04/2016   Chronic, continuous use of opioids 07/01/2015   Vertigo 07/01/2015   Chronic midline low back pain without sciatica 03/21/2015   Type 2 diabetes mellitus with other specified complication (HCC) 09/05/2014   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   Hip pain, chronic 04/28/2013   Essential hypertension 01/10/2013   Irritable colon 01/10/2013   Encounter  for long-term (current) use of other medications 12/04/2012   Chronic GERD 08/12/2012   Diarrhea 08/12/2012   Primary localized osteoarthrosis, lower leg 05/07/2012   Difficulty walking 02/06/2012    PCP: Dr. Marsa Officer  REFERRING PROVIDER: Dr. Suzen Brantley Salters  REFERRING DIAG: Cervicalgia  THERAPY DIAG:  Chronic pain of right knee  Difficulty in walking, not elsewhere classified  Stiffness of right knee, not elsewhere classified  Rationale for Evaluation and Treatment: Rehabilitation ONSET DATE: Jan 2025  SUBJECTIVE:                                                                                                                                                                                                        SUBJECTIVE STATEMENT:  Pt states that her right knee continues to lock up and give out on her and that this has been happening with more frequency and especially in the morning.    PERTINENT HISTORY:  Pt reports insidious onset of Rt knee pain and twice weekly buckling episodes starting ~Jan 2025, has constant pain on the femur lateral to the patella. History of remote unicompartmental knee arthroplasty. Pt has not fallen due to this, but she is very concerned about a fall in the future due to her baseline mobility limitations. At eval pain is reported as 6/10 current, 4/10 best in past week, 8/10 worst in past week. Pt manages her pain at home with heat, ice, tylenol  arthritis, and/or percocet.   PAIN:  6-7/10 Right lumbar paraspinal and over into right buttocks   PRECAUTIONS: None WEIGHT BEARING RESTRICTIONS: No FALLS:  Has patient fallen in last 6 months? No  OCCUPATION: On disability  PLOF: Independent  PATIENT GOALS: Decrease pain, avoid falling, abolish buckling episodes.   OBJECTIVE:  PATIENT SURVEYS:  LEFS: 25% or 20/80 12/04/23   ROM:  -Rt knee flexion: 105 degrees  -Rt knee extension: 24 degrees   STRENGTH ASSESSMENT:  -Rt knee  extension: 5/5  -Rt hip extension (straight knee, tested in supine, 30 degrees flexion): 3+/5  TODAY'S TREATMENT:  12/31/23  THEREX   Lumbar Flexion AROM with silver  ball flexion center, left and right  2 x 25    Assistance with donning right ankle and foot orthosis  Overground Ambulation 45 ft with use of 2WW  x 2    Sit to Stand with BUE support 3 x 5   SELF CARE HOME MANAGEMENT  Researched and discuss use of articulating ankle foot orthosis as option for patient to allow her to drive while wearing brace.      PATIENT EDUCATION:  Education details: Explained how lacking 24 degrees of full extension can increase resting patella loading and increase risk of buckling.   Person educated: Patient Education method: collaborative learning, deliberate practice, positive reinforcement, explicit instruction, establish rules. Education comprehension: verbalized understanding, returned demonstration, verbal cues required, and tactile cues required  HOME EXERCISE PROGRAM: Access Code: [email or web address] URL: [email or web address] Date: 12/31/2023 Prepared by: Toribio Servant  Program Notes Seated Towel Slides 3 x 30 x 7 days per week   Exercises - Modified Thomas Stretch  - 1 x daily - 7 x weekly - 3 reps - 60 secsec hold - Sidelying ITB Stretch off Table  - 1 x daily - 7 x weekly - 3 reps - 60 sec hold - Seated Gastroc Stretch with Strap  - 1 x daily - 7 x weekly - 3 reps - 60 sec  hold - Seated Hamstring Stretch  - 1 x daily - 7 x weekly - 3 reps - 60 sec hold - Seated Quadratus Lumborum Stretch in Chair  - 1 x daily - 7 x weekly - 3 reps - 30-60 sec hold - Standing Marching  - 3-4 x weekly - 3 sets - 10 reps - Sitting Knee Extension with Resistance  - 3-4 x weekly - 3 sets - 10 reps - Hooklying Clamshell with Resistance  - 3-4 x weekly - 3 sets - 10 reps - Sit to Stand with Counter Support  - 3-4 x weekly - 3 sets - 10 reps  ASSESSMENT: CLINICAL IMPRESSION:  Pt shows  improvement with right knee pain tolerance and function with use of right AFO. She was able to to ambulate with decreased toe drag and perform sit to stands with decreased right knee pain. PT recommends that she continue to utilize right AFO while ambulating and performing weight bearing exercises and to discuss articulating AFO option so that she can donn brace while driving and not have to worry about donning and doffing independently. She will continue to benefit from PT to reduced falls risk, improve walkign tolerance, reduce pain, and reduce caregiver burden.      OBJECTIVE IMPAIRMENTS: decreased ROM, decreased strength, impaired sensation, impaired UE functional use, and pain.   ACTIVITY LIMITATIONS: carrying, lifting, bending, bathing, dressing, reach over head, and hygiene/grooming PARTICIPATION LIMITATIONS: meal prep, cleaning, laundry, shopping, and community activity PERSONAL FACTORS: Age, Education, Sex, Social background, Time since onset of injury/illness/exacerbation, Transportation, and 3+ comorbidities: Cerebral Palsy, HTN, T2DM are also affecting patient's functional outcome.  REHAB POTENTIAL: Fair (timeline since knee surgery) CLINICAL DECISION MAKING: Medium  EVALUATION COMPLEXITY: complicated    GOALS: Goals reviewed with patient? YES  SHORT TERM GOALS: Target date: 11/08/23 Patient will report comprehension, confidence, and consistent compliance and of a simple home exercise program established to facilitate symptoms management and basic strengthening and/or segment mobility.    Baseline: NT 10/15/23: Performing exercises independently    Status: ACHIEVED    Patient to demonstrate improved score on LEFS by >9%  to indicate reduced self-reported disability and/or pain.     Baseline: NOT MET     Status: 19/80 or 24%   11/11/23: 20/80 or  25%    LONG TERM GOALS: Target date: 02/13/24  1. Patient to improve score on LEFS by 20% or greater to indicate reduced disability and  improved quality of life.    Baseline: 19/80 (24%) 11/11/23: 20/80 or 25% 12/04/23: 25%    Status: ONGOING    2. Pt to report no episodes of knee buckling in most recent 2 weeks to indicate improved stability ogf knee joint loading in ADL performance.   Baseline: twice weekly episodes  11/18/23: Right knee is not giving out but painful.  12/04/23 Not giving out and not pain   Status: ACHIEVED     3. Pt to report worse pain rating in most recent 2 weeks no greater than 6/10 to indicate greater stability of pain symptoms with daily activity/mobility.   Baseline: 8/10 worst pain 11/25/23: 7/10 feels achy now and it used to be sharp   Status: ONGOING   PLAN:  PT FREQUENCY: 1-2x/week PT DURATION: 8 weeks PLANNED INTERVENTIONS: 97164- PT Re-evaluation, 97110-Therapeutic exercises, 97530- Therapeutic activity, 97112- Neuromuscular re-education, 97535- Self Care, 02859- Manual therapy, [phone]- Gait training, [phone]- Orthotic Fit/training, [phone]- Prosthetic training, [phone]- Aquatic Therapy, [phone]- Electrical stimulation (unattended), [phone]- Electrical stimulation (manual), [id number]- Traction (mechanical), Patient/Family education, Dry Needling, Joint mobilization, Joint manipulation, Spinal manipulation, Spinal mobilization, Cryotherapy, and Moist heat  PLAN FOR NEXT SESSION: Check DF and PF right ankle ankle motion and continue to work with weight bearing and ambulating using right AFO.   Maryanne Finder, PT, DPT Physical Therapist - [place]  [facility] [place]

## 2024-01-02 ENCOUNTER — Ambulatory Visit: Admitting: Physical Therapy

## 2024-01-07 ENCOUNTER — Ambulatory Visit: Admitting: Physical Therapy

## 2024-01-07 ENCOUNTER — Ambulatory Visit: Admitting: Obstetrics & Gynecology

## 2024-01-07 DIAGNOSIS — G8929 Other chronic pain: Secondary | ICD-10-CM | POA: Diagnosis not present

## 2024-01-07 DIAGNOSIS — N950 Postmenopausal bleeding: Secondary | ICD-10-CM

## 2024-01-07 DIAGNOSIS — M25561 Pain in right knee: Secondary | ICD-10-CM | POA: Diagnosis not present

## 2024-01-07 DIAGNOSIS — M25661 Stiffness of right knee, not elsewhere classified: Secondary | ICD-10-CM | POA: Diagnosis not present

## 2024-01-07 DIAGNOSIS — R262 Difficulty in walking, not elsewhere classified: Secondary | ICD-10-CM

## 2024-01-07 NOTE — Therapy (Signed)
 OUTPATIENT PHYSICAL THERAPY TREATMENT     Patient Name: Traci Davis MRN: [id number] DOB:11/10/1970, 53 y.o., female Today's Date: 01/07/2024  END OF SESSION:  PT End of Session - 01/07/24 1522     Visit Number 17    Number of Visits 20    Date for PT Re-Evaluation 02/13/24    Authorization Type UHC Medicare Dual (VL based on mn)    Authorization - Visit Number 17    Authorization - Number of Visits 20    Progress Note Due on Visit 20    PT Start Time 1515    PT Stop Time 1600    PT Time Calculation (min) 45 min    Activity Tolerance Patient tolerated treatment well    Behavior During Therapy [facility] for tasks assessed/performed                Past Medical History:  Diagnosis Date   Acute pancreatitis 09/04/2014   Anxiety    Arthritis    Asthma    Cerebral palsy (HCC)    Complication of anesthesia    panic attacks before surgery   COPD (chronic obstructive pulmonary disease) (HCC)    Depression    Diabetes mellitus without complication (HCC)    GERD (gastroesophageal reflux disease)    Hypertension    Noninfectious gastroenteritis and colitis 01/10/2013   Past Surgical History:  Procedure Laterality Date   CESAREAN SECTION  1995   CHOLECYSTECTOMY     DILATATION & CURETTAGE/HYSTEROSCOPY WITH MYOSURE N/A 02/20/2018   Procedure: DILATATION & CURETTAGE/HYSTEROSCOPY WITH MYOSURE ENDOMETRIAL POLYPECTOMY;  Surgeon: Leonce Garnette BIRCH, MD;  Location: [facility] ORS;  Service: Gynecology;  Laterality: N/A;   ESOPHAGOGASTRODUODENOSCOPY (EGD) WITH PROPOFOL  N/A 01/01/2019   Procedure: ESOPHAGOGASTRODUODENOSCOPY (EGD) WITH PROPOFOL ;  Surgeon: Unk Corinn Skiff, MD;  Location: [facility] ENDOSCOPY;  Service: Gastroenterology;  Laterality: N/A;   FOOT CAPSULE RELEASE W/ PERCUTANEOUS HEEL CORD LENGTHENING, TIBIAL TENDON TRANSFER     age 68 ,and 2010-both legs   JOINT REPLACEMENT Right    both knees partial replacements dates unknown   knee replacment     x 5 per pt. last one- left knee  2014. has had 2 on R 3 on L   TYMPANOPLASTY WITH GRAFT Right 01/08/2018   Procedure: TYMPANOPLASTY WITH POSSIBLE OSSICULAR CHAIN RECONSTRUCTION;  Surgeon: Blair Mt, MD;  Location: [facility] ORS;  Service: ENT;  Laterality: Right;   Patient Active Problem List   Diagnosis Date Noted   Urge urinary incontinence 04/02/2023   Incontinence of feces 04/02/2023   Vaginal atrophy 04/02/2023   Asthma, persistent controlled 02/06/2023   Continuous leakage of urine 01/04/2023   De Quervain's tenosynovitis, right 10/08/2022   Hot flashes due to menopause 07/17/2022   Convulsions (HCC) 07/09/2022   Obesity due to excess calories 09/22/2020   Insomnia due to medical condition 05/11/2020   Hamstring tendonitis 01/29/2020   MDD (major depressive disorder), recurrent, in full remission (HCC) 08/12/2019   Seizure-like activity (HCC) 07/08/2019   Mild episode of recurrent major depressive disorder (HCC) 12/29/2018   Insomnia due to mental condition 12/29/2018   Disorder of vein 03/04/2018   Lumbar sprain 03/04/2018   Muscle weakness 03/04/2018   Neck sprain 03/04/2018   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   Postmenopausal bleeding 02/18/2018   Impingement syndrome of shoulder region 02/04/2018   Chest pain with moderate risk for cardiac etiology 05/19/2017   GAD (generalized anxiety disorder) 04/15/2017   Chronic daily headache 04/15/2017   Panic attack 04/15/2017  Nocturnal enuresis 04/15/2017   Mild cognitive impairment 04/15/2017   Loss of memory 01/14/2017   Pain medication agreement signed 01/08/2017   Headache disorder 12/04/2016   Chronic, continuous use of opioids 07/01/2015   Vertigo 07/01/2015   Chronic midline low back pain without sciatica 03/21/2015   Type 2 diabetes mellitus with other specified complication (HCC) 09/05/2014   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   Hip pain, chronic 04/28/2013   Essential hypertension 01/10/2013   Irritable colon 01/10/2013   Encounter  for long-term (current) use of other medications 12/04/2012   Chronic GERD 08/12/2012   Diarrhea 08/12/2012   Primary localized osteoarthrosis, lower leg 05/07/2012   Difficulty walking 02/06/2012    PCP: Dr. Marsa Officer  REFERRING PROVIDER: Dr. Suzen Brantley Salters  REFERRING DIAG: Cervicalgia  THERAPY DIAG:  Chronic pain of right knee  Difficulty in walking, not elsewhere classified  Stiffness of right knee, not elsewhere classified  Rationale for Evaluation and Treatment: Rehabilitation ONSET DATE: Jan 2025  SUBJECTIVE:                                                                                                                                                                                                        SUBJECTIVE STATEMENT:  Pt reports pulling her back when trying to get her walker into her car yesterday. She has been experiencing a significant amount of pain in right lumbar paraspinal.     PERTINENT HISTORY:  Pt reports insidious onset of Rt knee pain and twice weekly buckling episodes starting ~Jan 2025, has constant pain on the femur lateral to the patella. History of remote unicompartmental knee arthroplasty. Pt has not fallen due to this, but she is very concerned about a fall in the future due to her baseline mobility limitations. At eval pain is reported as 6/10 current, 4/10 best in past week, 8/10 worst in past week. Pt manages her pain at home with heat, ice, tylenol  arthritis, and/or percocet.   PAIN:  6-7/10 Right lumbar paraspinal and over into right buttocks   PRECAUTIONS: None WEIGHT BEARING RESTRICTIONS: No FALLS:  Has patient fallen in last 6 months? No  OCCUPATION: On disability  PLOF: Independent  PATIENT GOALS: Decrease pain, avoid falling, abolish buckling episodes.   OBJECTIVE:  PATIENT SURVEYS:  LEFS: 25% or 20/80 12/04/23   ROM:  -Rt knee flexion: 105 degrees  -Rt knee extension: 24 degrees   STRENGTH ASSESSMENT:  -Rt  knee extension: 5/5  -Rt hip extension (straight knee, tested in supine, 30 degrees flexion): 3+/5  TODAY'S TREATMENT:  01/07/24: THEREX   Ankle AROM PF on RLE 40 deg   Ankle AROM DF on RLE 10 deg    Lower Trunk Rotation 2 x 10 with 3 sec hold    Bent knee fall out with red band 2 x 10    Seated QL Stretch on Right Side 2 x 10 sec  -Pt unable to perform due to pain    MANUAL: Applied moist heat to patient   HS Stretch on RLE  2 x 60 sec   Glute Stretch on LLE and RLE 2 x 60 sec         PATIENT EDUCATION:  Education details: Explained how lacking 24 degrees of full extension can increase resting patella loading and increase risk of buckling.   Person educated: Patient Education method: collaborative learning, deliberate practice, positive reinforcement, explicit instruction, establish rules. Education comprehension: verbalized understanding, returned demonstration, verbal cues required, and tactile cues required  HOME EXERCISE PROGRAM: Access Code: [email or web address] URL: [email or web address] Date: 01/07/2024 Prepared by: Toribio Servant  Program Notes Seated Towel Slides 3 x 30 x 7 days per week   Exercises - Seated 3 Way Exercise Ball Roll Out Stretch  - 1 x daily - 7 x weekly - 2 sets - 10 reps - Hooklying Single Knee to Chest Stretch with Towel (Mirrored)  - 1 x daily - 7 x weekly - 2 sets - 10 reps - 5 sec hold - Hooklying Single Knee to Chest Stretch with Towel  - 1 x daily - 7 x weekly - 2 sets - 10 reps - 5 sec hold - Supine Lower Trunk Rotation  - 1 x daily - 7 x weekly - 2 sets - 10 reps - 30 sec  hold - Supine Transversus Abdominis Bracing - Hands on Stomach  - 1 x daily - 7 x weekly - 2 sets - 10 reps - 3 sec hold - Seated Gastroc Stretch with Strap  - 1 x daily - 7 x weekly - 3 reps - 60 sec  hold - Seated Hamstring Stretch  - 1 x daily - 7 x weekly - 3 reps - 60 sec hold - Seated Quadratus Lumborum Stretch in Chair  - 1 x daily - 7 x weekly - 3 reps -  30-60 sec hold - Standing Marching  - 3-4 x weekly - 3 sets - 10 reps - Sitting Knee Extension with Resistance  - 3-4 x weekly - 3 sets - 10 reps - Hooklying Clamshell with Resistance  - 3-4 x weekly - 3 sets - 10 reps - Sit to Stand with Counter Support  - 3-4 x weekly - 3 sets - 10 reps  ASSESSMENT: CLINICAL IMPRESSION:  Pt limited by thoracic right paraspinal strain during session. Modified exercises to include only hook lying position to avoid aggravating strained thoracic paraspinal. She was able to tolerate session with minimal discomfort especially after utilizing moist heat. She did have to be helped back to car using wheelchair due to ongoing low back pain. She does possess the request right ankle mobility for articulating ankle knee ankle foot orthosis, which would help her better utilize brace by being able to drive while using brace. PT to reach out to referring physician with assessment of her ankle mobility. She will continue to benefit from PT to reduced falls risk, improve walkign tolerance, reduce pain, and reduce caregiver burden.      OBJECTIVE IMPAIRMENTS: decreased ROM, decreased strength, impaired sensation, impaired UE functional use, and pain.  ACTIVITY LIMITATIONS: carrying, lifting, bending, bathing, dressing, reach over head, and hygiene/grooming PARTICIPATION LIMITATIONS: meal prep, cleaning, laundry, shopping, and community activity PERSONAL FACTORS: Age, Education, Sex, Social background, Time since onset of injury/illness/exacerbation, Transportation, and 3+ comorbidities: Cerebral Palsy, HTN, T2DM are also affecting patient's functional outcome.  REHAB POTENTIAL: Fair (timeline since knee surgery) CLINICAL DECISION MAKING: Medium  EVALUATION COMPLEXITY: complicated    GOALS: Goals reviewed with patient? YES  SHORT TERM GOALS: Target date: 11/08/23 Patient will report comprehension, confidence, and consistent compliance and of a simple home exercise program  established to facilitate symptoms management and basic strengthening and/or segment mobility.    Baseline: NT 10/15/23: Performing exercises independently    Status: ACHIEVED    Patient to demonstrate improved score on LEFS by >9% to indicate reduced self-reported disability and/or pain.     Baseline: NOT MET     Status: 19/80 or 24%   11/11/23: 20/80 or  25%    LONG TERM GOALS: Target date: 02/13/24  1. Patient to improve score on LEFS by 20% or greater to indicate reduced disability and improved quality of life.    Baseline: 19/80 (24%) 11/11/23: 20/80 or 25% 12/04/23: 25%    Status: ONGOING    2. Pt to report no episodes of knee buckling in most recent 2 weeks to indicate improved stability ogf knee joint loading in ADL performance.   Baseline: twice weekly episodes  11/18/23: Right knee is not giving out but painful.  12/04/23 Not giving out and not pain   Status: ACHIEVED     3. Pt to report worse pain rating in most recent 2 weeks no greater than 6/10 to indicate greater stability of pain symptoms with daily activity/mobility.   Baseline: 8/10 worst pain 11/25/23: 7/10 feels achy now and it used to be sharp   Status: ONGOING   PLAN:  PT FREQUENCY: 1-2x/week PT DURATION: 8 weeks PLANNED INTERVENTIONS: 97164- PT Re-evaluation, 97110-Therapeutic exercises, 97530- Therapeutic activity, 97112- Neuromuscular re-education, 97535- Self Care, 02859- Manual therapy, [phone]- Gait training, [phone]- Orthotic Fit/training, [phone]- Prosthetic training, [phone]- Aquatic Therapy, [phone]- Electrical stimulation (unattended), [phone]- Electrical stimulation (manual), [id number]- Traction (mechanical), Patient/Family education, Dry Needling, Joint mobilization, Joint manipulation, Spinal manipulation, Spinal mobilization, Cryotherapy, and Moist heat  PLAN FOR NEXT SESSION: Continue to work with weight bearing and ambulating using right AFO if lumbar pain has subsided. If not manual with trigger point release and massage of  right thoracic paraspinal as well as gentle ROM lumbar and thoracic exercises

## 2024-01-09 ENCOUNTER — Ambulatory Visit: Admitting: Physical Therapy

## 2024-01-09 ENCOUNTER — Encounter: Payer: Self-pay | Admitting: Dietician

## 2024-01-09 ENCOUNTER — Telehealth: Payer: Self-pay | Admitting: Dietician

## 2024-01-09 NOTE — Telephone Encounter (Signed)
 Scheduled diabetes follow up visit for 02/17/24.

## 2024-01-10 DIAGNOSIS — E1165 Type 2 diabetes mellitus with hyperglycemia: Secondary | ICD-10-CM | POA: Diagnosis not present

## 2024-01-10 DIAGNOSIS — E119 Type 2 diabetes mellitus without complications: Secondary | ICD-10-CM | POA: Diagnosis not present

## 2024-01-10 DIAGNOSIS — Z794 Long term (current) use of insulin: Secondary | ICD-10-CM | POA: Diagnosis not present

## 2024-01-13 ENCOUNTER — Telehealth (INDEPENDENT_AMBULATORY_CARE_PROVIDER_SITE_OTHER): Admitting: Psychiatry

## 2024-01-13 ENCOUNTER — Encounter: Payer: Self-pay | Admitting: Psychiatry

## 2024-01-13 ENCOUNTER — Telehealth: Admitting: Psychiatry

## 2024-01-13 DIAGNOSIS — G4701 Insomnia due to medical condition: Secondary | ICD-10-CM

## 2024-01-13 DIAGNOSIS — F3342 Major depressive disorder, recurrent, in full remission: Secondary | ICD-10-CM

## 2024-01-13 DIAGNOSIS — R52 Pain, unspecified: Secondary | ICD-10-CM

## 2024-01-13 DIAGNOSIS — F411 Generalized anxiety disorder: Secondary | ICD-10-CM

## 2024-01-13 DIAGNOSIS — R61 Generalized hyperhidrosis: Secondary | ICD-10-CM | POA: Diagnosis not present

## 2024-01-13 MED ORDER — BUSPIRONE HCL 10 MG PO TABS
10.0000 mg | ORAL_TABLET | Freq: Three times a day (TID) | ORAL | 1 refills | Status: AC
Start: 2024-01-13 — End: ?

## 2024-01-13 MED ORDER — LAMOTRIGINE 25 MG PO TABS: 25.0000 mg | ORAL_TABLET | Freq: Every day | ORAL | 1 refills | Status: AC

## 2024-01-13 MED ORDER — FLUOXETINE HCL 20 MG PO CAPS
20.0000 mg | ORAL_CAPSULE | Freq: Every day | ORAL | 1 refills | Status: AC
Start: 2024-01-13 — End: ?

## 2024-01-13 MED ORDER — FLUOXETINE HCL 40 MG PO CAPS: 40.0000 mg | ORAL_CAPSULE | Freq: Every day | ORAL | 1 refills | Status: AC

## 2024-01-13 NOTE — Progress Notes (Unsigned)
 Virtual Visit via Video Note  I connected with Traci Davis on 01/13/24 at  1:20 PM EDT by a video enabled telemedicine application and verified that I am speaking with the correct person using two identifiers.  Location Provider Location : [facility] Patient Location : Home  Participants: Patient , Provider    I discussed the limitations of evaluation and management by telemedicine and the availability of in person appointments. The patient expressed understanding and agreed to proceed.   I discussed the assessment and treatment plan with the patient. The patient was provided an opportunity to ask questions and all were answered. The patient agreed with the plan and demonstrated an understanding of the instructions.   The patient was advised to call back or seek an in-person evaluation if the symptoms worsen or if the condition fails to improve as anticipated.   BH MD OP Progress Note  01/13/2024 1:39 PM Traci Davis  MRN:  [id number]  Chief Complaint:  Chief Complaint  Patient presents with   Follow-up   Anxiety   Depression   Medication Refill   Discussed the use of AI scribe software for clinical note transcription with the patient, who gave verbal consent to proceed.  History of Present Illness Traci Davis is a 53 year old Caucasian female lives in [facility], has a history of MDD, GAD, insomnia, cerebral palsy, diabetes mellitus on SSD was evaluated by telemedicine today.  Ongoing difficulty with sleep continues, as she describes trouble falling asleep until 2 or 3 a.m. even though she goes to bed around 10:30 or 11 p.m. Night sweats and hot flashes related to likely menopause, which cause her to wake up sweating in the middle of the night, primarily disrupt her sleep. These symptoms make it challenging for her to achieve restful sleep most nights.   Her current regimen includes mirtazapine  22.5 mg daily, BuSpar  10 mg 3 times a day, fluoxetine  60 mg daily, and Lamictal  25  mg daily.  She reports no changes since the last visit.  She denies any side effects to medications.  She denies any thoughts of hurting herself or others , denies any perceptual disturbances.  She has good social support system from her her daughter and the nurse who visits.  She continues to struggle with pain and is on Butrans patch.  She is also in physical therapy.  Pain does affect her day-to-day functioning.  Denies any other concerns today.   Visit Diagnosis:    ICD-10-CM   1. MDD (major depressive disorder), recurrent, in full remission (HCC)  F33.42 FLUoxetine  (PROZAC ) 20 MG capsule    2. GAD (generalized anxiety disorder)  F41.1 lamoTRIgine  (LAMICTAL ) 25 MG tablet    FLUoxetine  (PROZAC ) 40 MG capsule    FLUoxetine  (PROZAC ) 20 MG capsule    busPIRone  (BUSPAR ) 10 MG tablet    3. Insomnia due to medical condition  G47.01    Pain, mood      Past Psychiatric History: I have reviewed past psychiatric history from progress note on 09/12/2017.  Past trials of Xanax, Klonopin, Cymbalta , Rozerem , Ambien , trazodone , doxepin .  Past Medical History:  Past Medical History:  Diagnosis Date   Acute pancreatitis 09/04/2014   Anxiety    Arthritis    Asthma    Cerebral palsy (HCC)    Complication of anesthesia    panic attacks before surgery   COPD (chronic obstructive pulmonary disease) (HCC)    Depression    Diabetes mellitus without complication (HCC)    GERD (  gastroesophageal reflux disease)    Hypertension    Noninfectious gastroenteritis and colitis 01/10/2013    Past Surgical History:  Procedure Laterality Date   CESAREAN SECTION  1995   CHOLECYSTECTOMY     DILATATION & CURETTAGE/HYSTEROSCOPY WITH MYOSURE N/A 02/20/2018   Procedure: DILATATION & CURETTAGE/HYSTEROSCOPY WITH MYOSURE ENDOMETRIAL POLYPECTOMY;  Surgeon: Leonce Garnette BIRCH, MD;  Location: [facility];  Service: Gynecology;  Laterality: N/A;   ESOPHAGOGASTRODUODENOSCOPY (EGD) WITH PROPOFOL  N/A 01/01/2019    Procedure: ESOPHAGOGASTRODUODENOSCOPY (EGD) WITH PROPOFOL ;  Surgeon: Unk Corinn Skiff, MD;  Location: [facility];  Service: Gastroenterology;  Laterality: N/A;   FOOT CAPSULE RELEASE W/ PERCUTANEOUS HEEL CORD LENGTHENING, TIBIAL TENDON TRANSFER     age 81 ,and 2010-both legs   JOINT REPLACEMENT Right    both knees partial replacements dates unknown   knee replacment     x 5 per pt. last one- left knee 2014. has had 2 on R 3 on L   TYMPANOPLASTY WITH GRAFT Right 01/08/2018   Procedure: TYMPANOPLASTY WITH POSSIBLE OSSICULAR CHAIN RECONSTRUCTION;  Surgeon: Blair Mt, MD;  Location: [facility];  Service: ENT;  Laterality: Right;    Family Psychiatric History: I have reviewed family psychiatric history from progress note on 09/12/2017.  Family History:  Family History  Problem Relation Age of Onset   CAD Father    Heart attack Brother    Sexual abuse Paternal Grandfather    Breast cancer Neg Hx     Social History: Reviewed social history from progress note on 09/12/2017. Social History   Socioeconomic History   Marital status: Single    Spouse name: Not on file   Number of children: 1   Years of education: Not on file   Highest education level: Associate degree: occupational, Scientist, product/process development, or vocational program  Occupational History   Not on file  Tobacco Use   Smoking status: Never   Smokeless tobacco: Never  Vaping Use   Vaping status: Never Used  Substance and Sexual Activity   Alcohol use: No   Drug use: No   Sexual activity: Yes    Partners: Male    Birth control/protection: Condom  Other Topics Concern   Not on file  Social History Narrative   Not on file   Social Drivers of Health   Financial Resource Strain: Low Risk  (11/07/2023)   [facility]   Overall Financial Resource Strain (CARDIA)    Difficulty of Paying Living Expenses: Not very hard  Food Insecurity: No Food Insecurity (11/07/2023)   Received from [facility]   Hunger Vital Sign    Within the past 12 months, you worried that your food would run out before you got the money to buy more.: Never true    Within the past 12 months, the food you bought just didn't last and you didn't have money to get more.: Never true  Transportation Needs: No Transportation Needs (11/07/2023)   Received from [facility] - Transportation    In the past 12 months, has lack of transportation kept you from medical appointments or from getting medications?: No    Lack of Transportation (Non-Medical): No  Physical Activity: Insufficiently Active (05/09/2023)   Exercise Vital Sign    Days of Exercise per Week: 3 days    Minutes of Exercise per Session: 30 min  Stress: No Stress Concern Present (05/23/2023)   Received [facility] - Occupational Stress Questionnaire    Feeling of Stress : Only a little  Social Connections: Socially Integrated (05/23/2023)   Received from [facility]   Social Connection and Isolation Panel    In a typical week, how many times do you talk on the phone with family, friends, or neighbors?: More than three times a week    How often do you get together with friends or relatives?: More than three times a week    How often do you attend church or religious services?: More than 4 times per year    Do you belong to any clubs or organizations such as church groups, unions, fraternal or athletic groups, or school groups?: Yes    How often do you attend meetings of the clubs or organizations you belong to?: More than 4 times per year    Are you married, widowed, divorced, separated, never married, or living with a partner?: Living with partner  Recent Concern: Social Connections - Moderately Isolated (05/09/2023)   Social Connection and Isolation Panel    Frequency of Communication with Friends and Family: Three times a week    Frequency of Social Gatherings  with Friends and Family: Twice a week    Attends Religious Services: More than 4 times per year    Active Member of [employer] or Organizations: No    Attends Engineer, structural: Not on file    Marital Status: Never married    Allergies:  Allergies  Allergen Reactions   Meloxicam Other (See Comments)    Damage kidney   Morphine And Codeine Other (See Comments)    hallucinations   Vantin [Cefpodoxime] Nausea And Vomiting   Latex Itching   Baclofen Other (See Comments)    makes cerebral palsy do adverse reaction on me, tightens muscles    Ciprofloxacin  Itching and Nausea And Vomiting   Prednisone Other (See Comments)    Elevated blood glucose   Tape Rash    skin tears.  Paper tape is ok   Tramadol Nausea Only    Metabolic Disorder Labs: Lab Results  Component Value Date   HGBA1C 9.6 (H) 09/04/2023   MPG 229 09/04/2023   No results found for: PROLACTIN Lab Results  Component Value Date   CHOL 226 (H) 09/04/2023   TRIG 198 (H) 09/04/2023   HDL 60 09/04/2023   CHOLHDL 3.8 09/04/2023   LDLCALC 132 (H) 09/04/2023   Lab Results  Component Value Date   TSH 1.87 09/04/2023   TSH 2.198 07/10/2016    Therapeutic Level Labs: No results found for: LITHIUM No results found for: VALPROATE No results found for: CBMZ  Current Medications: Current Outpatient Medications  Medication Sig Dispense Refill   acetaminophen  (TYLENOL ) 325 MG tablet Take 2 tablets (650 mg total) by mouth every 6 (six) hours as needed. 60 tablet 0   albuterol  (PROVENTIL  HFA;VENTOLIN  HFA) 108 (90 BASE) MCG/ACT inhaler Inhale 2 puffs into the lungs 4 (four) times daily as needed. For shortness of breath and/or wheezing     amLODipine  (NORVASC ) 5 MG tablet Take 1 tablet (5 mg total) by mouth daily. 90 tablet 3   atorvastatin  (LIPITOR) 20 MG tablet Take 1 tablet (20 mg total) by mouth daily. 90 tablet 3   azelastine (ASTELIN) 0.1 % nasal spray Place into both nostrils.     Blood Glucose  Monitoring Suppl (GLUCOCOM BLOOD GLUCOSE MONITOR) DEVI Test daily before all meals/snacks and once before bedtime.     budesonide-formoterol (SYMBICORT)  80-4.5 MCG/ACT inhaler Inhale 2 puffs up to 4 times daily as needed for symptoms of wheezing or shortness of breath.     buprenorphine (BUTRANS) 15 MCG/HR 1 patch once a week.     busPIRone  (BUSPAR ) 10 MG tablet Take 1 tablet (10 mg total) by mouth 3 (three) times daily. 270 tablet 1   Continuous Glucose Sensor (DEXCOM G7 SENSOR) MISC Use 1 sensor every 10 days for continuous glucose monitoring 9 each 3   CREON  36000-114000 units CPEP capsule Take 3 capsules (108,000 Units total) by mouth 3 (three) times daily with meals. 300 capsule 5   dicyclomine  (BENTYL ) 10 MG capsule Take 2 capsules in morning with meal and 1 capsule in evening with meal. 90 capsule 5   donepezil (ARICEPT) 10 MG tablet Take 10 mg by mouth at bedtime.     estradiol (ESTRACE) 0.1 MG/GM vaginal cream Place 1 Applicatorful vaginally 3 (three) times a week.     FLUoxetine  (PROZAC ) 20 MG capsule Take 1 capsule (20 mg total) by mouth daily. Take along with 40 mg daily - total of 60 mg daily 90 capsule 1   FLUoxetine  (PROZAC ) 40 MG capsule Take 1 capsule (40 mg total) by mouth daily. Take along with 20 mg daily 90 capsule 1   fluticasone  (FLONASE ) 50 MCG/ACT nasal spray Place 2 sprays into both nostrils daily. 48 g 3   furosemide  (LASIX ) 40 MG tablet Take 1 tablet (40 mg total) by mouth daily as needed for fluid. 30 tablet 5   gabapentin (NEURONTIN) 300 MG capsule Take 600 mg by mouth 3 (three) times daily.     glucose blood test strip USE TO TEST BLOOD SUGAR TWO TIMES A DAY     GVOKE HYPOPEN  2-PACK 1 MG/0.2ML SOAJ Inject 1 mg into the skin as needed (hypoglycemia). 1 mL 0   incobotulinumtoxinA (XEOMIN) 100 units SOLR injection Inject 100 Units into the muscle every 3 (three) months.      Incontinence Supply Disposable (PREVAIL BLADDER CONTROL PAD) MISC Large incontinence pads to go  into underwear     Insulin  Pen Needle (GLOBAL EASE INJECT PEN NEEDLES) 31G X 5 MM MISC Use pen needle to inject insulin  daily. 100 each 0   lamoTRIgine  (LAMICTAL ) 25 MG tablet Take 1 tablet (25 mg total) by mouth daily. 90 tablet 1   Lancets (FREESTYLE) lancets Test daily before all meals/snacks     LANTUS  SOLOSTAR 100 UNIT/ML Solostar Pen Inject 42 Units into the skin daily. 38 mL 1   lidocaine  (LIDODERM ) 5 % Place 2 patches onto the skin daily.     linaclotide (LINZESS) 72 MCG capsule Take 72 mcg by mouth daily before breakfast.     lisinopril  (ZESTRIL ) 20 MG tablet Take 1 tablet (20 mg total) by mouth daily. 90 tablet 3   metFORMIN  (GLUCOPHAGE -XR) 500 MG 24 hr tablet Take 2 tablets (1,000 mg total) by mouth 2 (two) times daily. 360 tablet 3   mirabegron  ER (MYRBETRIQ ) 50 MG TB24 tablet Take 1 tablet (50 mg total) by mouth daily. 30 tablet 3   mirtazapine  (REMERON ) 15 MG tablet Take 1.5 tablets (22.5 mg total) by mouth at bedtime. 135 tablet 1   montelukast  (SINGULAIR ) 10 MG tablet Take 1 tablet (10 mg total) by mouth at bedtime. 90 tablet 3   naloxone (NARCAN) nasal spray 4 mg/0.1 mL      nystatin  powder Apply 1 Application topically 3 (three) times daily. Until rash resolved, repeat use for another flare if needed  30 g 3   omeprazole  (PRILOSEC) 40 MG capsule Take 1 capsule (40 mg total) by mouth in the morning and at bedtime. 90 capsule 3   ondansetron  (ZOFRAN -ODT) 4 MG disintegrating tablet Take 1 tablet (4 mg total) by mouth every 8 (eight) hours as needed for nausea or vomiting. 30 tablet 2   oxyCODONE -acetaminophen  (PERCOCET) 10-325 MG tablet Take 1 tablet by mouth 3 (three) times daily as needed.     SUMAtriptan (IMITREX) 100 MG tablet Take 100 mg by mouth as directed. As needed for migraines     triamcinolone cream (KENALOG) 0.1 % Apply 1 Application topically 3 (three) times daily as needed.     No current facility-administered medications for this visit.      Musculoskeletal: Strength & Muscle Tone: UTA Gait & Station: Seated Patient leans: N/A  Psychiatric Specialty Exam: Review of Systems  Psychiatric/Behavioral:  Positive for sleep disturbance.     Last menstrual period 08/20/2014.There is no height or weight on file to calculate BMI.  General Appearance: Casual  Eye Contact:  Fair  Speech:  Clear and Coherent  Volume:  Normal  Mood:  Euthymic  Affect:  Congruent  Thought Process:  Goal Directed and Descriptions of Associations: Intact  Orientation:  Full (Time, Place, and Person)  Thought Content: Logical   Suicidal Thoughts:  No  Homicidal Thoughts:  No  Memory:  Immediate;   Fair Recent;   Fair Remote;   Fair  Judgement:  Fair  Insight:  Fair  Psychomotor Activity:  Normal  Concentration:  Concentration: Fair and Attention Span: Fair  Recall:  [employer] of Knowledge: Fair  Language: Fair  Akathisia:  No  Handed:  Right  AIMS (if indicated): not done  Assets:  Communication Skills Desire for Improvement Housing Social Support  ADL's:  Intact  Cognition: WNL  Sleep:  varies due to night sweats, hot flashes   Screenings: AIMS    Flowsheet Row Office Visit from 04/05/2022 in [facility] Office Visit from 10/16/2021 in [facility]  AIMS Total Score 0 0   GAD-7    Flowsheet Row Video Visit from 12/06/2022 in [facility] Office Visit from 04/05/2022 in [facility] Video Visit from 06/06/2021 in [facility]  Total GAD-7 Score 5 8 1    PHQ2-9    Flowsheet Row Office Visit from 02/27/2023 in [facility] Video Visit from 12/06/2022 in [facility] Office Visit from 04/05/2022 in [facility] Video Visit from  11/13/2021 in [facility] Office Visit from 10/16/2021 in [facility]  PHQ-2 Total Score 0 0 2 0 1  PHQ-9 Total Score -- -- 9 -- 5   Flowsheet Row Video Visit from 01/13/2024 in [facility] Video Visit from 09/30/2023 in [facility] Video Visit from 07/01/2023 in [facility]  C-SSRS RISK CATEGORY No Risk No Risk No Risk     Assessment and Plan: MYCA PERNO is a 53 year old Caucasian female who has a history of depression, anxiety, chronic pain was evaluated by telemedicine today.  Discussed assessment and plan as noted below.  MDD recurrent in full remission Currently denies any significant depression symptoms Continue Prozac  60 mg daily Continue Lamotrigine  25 mg daily Continue  BuSpar  10 mg 3 times a day  Generalized anxiety disorder-stable Currently does have situational anxiety although managing okay. Continue Prozac  60 mg daily Continue BuSpar  10 mg 3 times a day  Insomnia due to medical condition-improving Does have sleep problems mostly due to night sweats and pain. Continue Mirtazapine  22.5 mg at bedtime Continue sleep hygiene techniques Will need sufficient pain management  Follow-up Follow-up in clinic in 3 to 4 months or sooner if needed.     Consent: Patient/Guardian gives verbal consent for treatment and assignment of benefits for services provided during this visit. Patient/Guardian expressed understanding and agreed to proceed.   This note was generated in part or whole with voice recognition software. Voice recognition is usually quite accurate but there are transcription errors that can and very often do occur. I apologize for any typographical errors that were not detected and corrected.    Jenny Omdahl, MD 01/14/2024, 1:16 PM

## 2024-01-14 ENCOUNTER — Ambulatory Visit: Admitting: Physical Therapy

## 2024-01-14 DIAGNOSIS — G3184 Mild cognitive impairment, so stated: Secondary | ICD-10-CM | POA: Diagnosis not present

## 2024-01-14 DIAGNOSIS — M542 Cervicalgia: Secondary | ICD-10-CM | POA: Diagnosis not present

## 2024-01-14 DIAGNOSIS — R569 Unspecified convulsions: Secondary | ICD-10-CM | POA: Diagnosis not present

## 2024-01-14 DIAGNOSIS — Z8669 Personal history of other diseases of the nervous system and sense organs: Secondary | ICD-10-CM | POA: Diagnosis not present

## 2024-01-14 DIAGNOSIS — R519 Headache, unspecified: Secondary | ICD-10-CM | POA: Diagnosis not present

## 2024-01-14 DIAGNOSIS — M549 Dorsalgia, unspecified: Secondary | ICD-10-CM | POA: Diagnosis not present

## 2024-01-14 DIAGNOSIS — G8929 Other chronic pain: Secondary | ICD-10-CM | POA: Diagnosis not present

## 2024-01-14 DIAGNOSIS — E559 Vitamin D deficiency, unspecified: Secondary | ICD-10-CM | POA: Diagnosis not present

## 2024-01-14 DIAGNOSIS — R5383 Other fatigue: Secondary | ICD-10-CM | POA: Diagnosis not present

## 2024-01-14 DIAGNOSIS — E538 Deficiency of other specified B group vitamins: Secondary | ICD-10-CM | POA: Diagnosis not present

## 2024-01-14 DIAGNOSIS — G479 Sleep disorder, unspecified: Secondary | ICD-10-CM | POA: Diagnosis not present

## 2024-01-15 ENCOUNTER — Ambulatory Visit (INDEPENDENT_AMBULATORY_CARE_PROVIDER_SITE_OTHER): Admitting: Nurse Practitioner

## 2024-01-15 ENCOUNTER — Encounter (INDEPENDENT_AMBULATORY_CARE_PROVIDER_SITE_OTHER): Payer: Self-pay | Admitting: Nurse Practitioner

## 2024-01-15 VITALS — BP 138/85 | HR 88 | Ht 62.0 in | Wt 200.0 lb

## 2024-01-15 DIAGNOSIS — M7989 Other specified soft tissue disorders: Secondary | ICD-10-CM | POA: Diagnosis not present

## 2024-01-15 DIAGNOSIS — I10 Essential (primary) hypertension: Secondary | ICD-10-CM | POA: Diagnosis not present

## 2024-01-15 DIAGNOSIS — G801 Spastic diplegic cerebral palsy: Secondary | ICD-10-CM | POA: Diagnosis not present

## 2024-01-15 DIAGNOSIS — R201 Hypoesthesia of skin: Secondary | ICD-10-CM | POA: Diagnosis not present

## 2024-01-16 ENCOUNTER — Ambulatory Visit: Admitting: Physical Therapy

## 2024-01-16 DIAGNOSIS — M25661 Stiffness of right knee, not elsewhere classified: Secondary | ICD-10-CM | POA: Diagnosis not present

## 2024-01-16 DIAGNOSIS — M25561 Pain in right knee: Secondary | ICD-10-CM | POA: Diagnosis not present

## 2024-01-16 DIAGNOSIS — R262 Difficulty in walking, not elsewhere classified: Secondary | ICD-10-CM

## 2024-01-16 DIAGNOSIS — G8929 Other chronic pain: Secondary | ICD-10-CM | POA: Diagnosis not present

## 2024-01-16 NOTE — Therapy (Signed)
 OUTPATIENT PHYSICAL THERAPY TREATMENT     Patient Name: KANCHAN GAL MRN: [id number] DOB:1970/08/12, 53 y.o., female Today's Date: 01/16/2024  END OF SESSION:  PT End of Session - 01/16/24 1531     Visit Number 18    Number of Visits 20    Date for Recertification  02/13/24    Authorization Type [employer]HC Medicare Dual (VL based on mn)    Authorization - Visit Number 18    Authorization - Number of Visits 20    Progress Note Due on Visit 20    PT Start Time 1515    PT Stop Time 1600    PT Time Calculation (min) 45 min    Activity Tolerance Patient tolerated treatment well    Behavior During Therapy [facility] for tasks assessed/performed                Past Medical History:  Diagnosis Date   Acute pancreatitis 09/04/2014   Anxiety    Arthritis    Asthma    Cerebral palsy (HCC)    Complication of anesthesia    panic attacks before surgery   COPD (chronic obstructive pulmonary disease) (HCC)    Depression    Diabetes mellitus without complication (HCC)    GERD (gastroesophageal reflux disease)    Hypertension    Noninfectious gastroenteritis and colitis 01/10/2013   Past Surgical History:  Procedure Laterality Date   CESAREAN SECTION  1995   CHOLECYSTECTOMY     DILATATION & CURETTAGE/HYSTEROSCOPY WITH MYOSURE N/A 02/20/2018   Procedure: DILATATION & CURETTAGE/HYSTEROSCOPY WITH MYOSURE ENDOMETRIAL POLYPECTOMY;  Surgeon: Leonce Garnette BIRCH, MD;  Location: [facility] ORS;  Service: Gynecology;  Laterality: N/A;   ESOPHAGOGASTRODUODENOSCOPY (EGD) WITH PROPOFOL  N/A 01/01/2019   Procedure: ESOPHAGOGASTRODUODENOSCOPY (EGD) WITH PROPOFOL ;  Surgeon: Unk Corinn Skiff, MD;  Location: [facility] END[facility];  Service: Gastroenterology;  Laterality: N/A;   FOOT CAPSULE RELEASE W/ PERCUTANEOUS HEEL CORD LENGTHENING, TIBIAL TENDON TRANSFER     age 87 ,and 2010-both legs   JOINT REPLACEMENT Right    both knees partial replacements dates unknown   knee replacment     x 5 per pt. last one- left  knee 2014. has had 2 on R 3 on L   TYMPANOPLASTY WITH GRAFT Right 01/08/2018   Procedure: TYMPANOPLASTY WITH POSSIBLE OSSICULAR CHAIN RECONSTRUCTION;  Surgeon: Blair Mt, MD;  Location: [facility] ORS;  Service: ENT;  Laterality: Right;   Patient Active Problem List   Diagnosis Date Noted   Urge urinary incontinence 04/02/2023   Incontinence of feces 04/02/2023   Vaginal atrophy 04/02/2023   Asthma, persistent controlled 02/06/2023   Continuous leakage of urine 01/04/2023   De Quervain's tenosynovitis, right 10/08/2022   Hot flashes due to menopause 07/17/2022   Convulsions (HCC) 07/09/2022   Obesity due to excess calories 09/22/2020   Insomnia due to medical condition 05/11/2020   Hamstring tendonitis 01/29/2020   MDD (major depressive disorder), recurrent, in full remission (HCC) 08/12/2019   Seizure-like activity (HCC) 07/08/2019   Mild episode of recurrent major depressive disorder (HCC) 12/29/2018   Insomnia due to mental condition 12/29/2018   Disorder of vein 03/04/2018   Lumbar sprain 03/04/2018   Muscle weakness 03/04/2018   Neck sprain 03/04/2018   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   Postmenopausal bleeding 02/18/2018   Impingement syndrome of shoulder region 02/04/2018   Chest pain with moderate risk for cardiac etiology 05/19/2017   GAD (generalized anxiety disorder) 04/15/2017   Chronic daily headache 04/15/2017   Panic attack 04/15/2017  Nocturnal enuresis 04/15/2017   Mild cognitive impairment 04/15/2017   Loss of memory 01/14/2017   Pain medication agreement signed 01/08/2017   Headache disorder 12/04/2016   Chronic, continuous use of opioids 07/01/2015   Vertigo 07/01/2015   Chronic midline low back pain without sciatica 03/21/2015   Type 2 diabetes mellitus with other specified complication (HCC) 09/05/2014   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   Hip pain, chronic 04/28/2013   Essential hypertension 01/10/2013   Irritable colon 01/10/2013    Encounter for long-term (current) use of other medications 12/04/2012   Chronic GERD 08/12/2012   Diarrhea 08/12/2012   Primary localized osteoarthrosis, lower leg 05/07/2012   Difficulty walking 02/06/2012    PCP: Dr. Marsa Officer  REFERRING PROVIDER: Dr. Suzen Brantley Salters  REFERRING DIAG: Cervicalgia  THERAPY DIAG:  Chronic pain of right knee  Difficulty in walking, not elsewhere classified  Stiffness of right knee, not elsewhere classified  Rationale for Evaluation and Treatment: Rehabilitation ONSET DATE: Jan 2025  SUBJECTIVE:                                                                                                                                                                                                        SUBJECTIVE STATEMENT:  Pt reports increased right hip pain describing it as hip impingement with pinching in her groin.     PERTINENT HISTORY:  Pt reports insidious onset of Rt knee pain and twice weekly buckling episodes starting ~Jan 2025, has constant pain on the femur lateral to the patella. History of remote unicompartmental knee arthroplasty. Pt has not fallen due to this, but she is very concerned about a fall in the future due to her baseline mobility limitations. At eval pain is reported as 6/10 current, 4/10 best in past week, 8/10 worst in past week. Pt manages her pain at home with heat, ice, tylenol  arthritis, and/or percocet.   PAIN:  6-7/10 Right lumbar paraspinal and over into right buttocks   PRECAUTIONS: None WEIGHT BEARING RESTRICTIONS: No FALLS:  Has patient fallen in last 6 months? No  OCCUPATION: On disability  PLOF: Independent  PATIENT GOALS: Decrease pain, avoid falling, abolish buckling episodes.   OBJECTIVE:  PATIENT SURVEYS:  LEFS: 25% or 20/80 12/04/23   ROM:  -Rt knee flexion: 105 degrees  -Rt knee extension: 24 degrees   STRENGTH ASSESSMENT:  -Rt knee extension: 5/5  -Rt hip extension (straight knee,  tested in supine, 30 degrees flexion): 3+/5  TODAY'S TREATMENT:   01/16/24: Manual: All performed on RLE   Hip Adductor stretch  3 x 30 sec Long Axial Hip Distraction 3 x 30 sec   HS Stretch  3 x 60 sec     THEREX   Right hip adductor stretch in supine 3 x 60 sec    Hook lying Bent knee fall out on RLE with yellow band 1 x 10   Hook lying Bent knee fall out on RLE with red band 1 x 10    Seated Abdominal Lean Back 1 x 10   Seated Abdominal Lean Back while holding jug of water 1 x 10   Standing Hip Extension on RLE with BUE support  2 x 10    01/07/24: THEREX   Ankle AROM PF on RLE 40 deg   Ankle AROM DF on RLE 10 deg    Lower Trunk Rotation 2 x 10 with 3 sec hold    Bent knee fall out with red band 2 x 10    Seated QL Stretch on Right Side 2 x 10 sec  -Pt unable to perform due to pain    MANUAL: Applied moist heat to patient   HS Stretch on RLE  2 x 60 sec   Glute Stretch on LLE and RLE 2 x 60 sec         PATIENT EDUCATION:  Education details: Explained how lacking 24 degrees of full extension can increase resting patella loading and increase risk of buckling.   Person educated: Patient Education method: collaborative learning, deliberate practice, positive reinforcement, explicit instruction, establish rules. Education comprehension: verbalized understanding, returned demonstration, verbal cues required, and tactile cues required  HOME EXERCISE PROGRAM: Access Code: [email or web address] URL: [email or web address] Date: 01/16/2024 Prepared by: Toribio Servant  Program Notes Seated Towel Slides 3 x 30 x 7 days per week   Exercises - Seated 3 Way Exercise Ball Roll Out Stretch  - 1 x daily - 7 x weekly - 2 sets - 10 reps - Supine Lower Trunk Rotation  - 1 x daily - 7 x weekly - 2 sets - 10 reps - 30 sec  hold - Supine Transversus Abdominis Bracing - Hands on Stomach  - 1 x daily - 7 x weekly - 2 sets - 10 reps - 3 sec hold - Seated Gastroc Stretch with Strap  - 1 x  daily - 7 x weekly - 3 reps - 60 sec  hold - Seated Hamstring Stretch  - 1 x daily - 7 x weekly - 3 reps - 60 sec hold - Seated Quadratus Lumborum Stretch in Chair  - 1 x daily - 7 x weekly - 3 reps - 30-60 sec hold - Standing Marching  - 3-4 x weekly - 3 sets - 10 reps - Sitting Knee Extension with Resistance  - 3-4 x weekly - 3 sets - 10 reps - Hooklying Clamshell with Resistance  - 3-4 x weekly - 3 sets - 10 reps - Sit to Stand with Counter Support  - 3-4 x weekly - 3 sets - 10 reps - Seated Eccentric Abdominal Lean Back  - 3-4 x weekly - 3 sets - 10 reps   ASSESSMENT: CLINICAL IMPRESSION:  Despite initial right sided hip pain, pt was able to perform all exercises without being limited by pain. She continues to struggle to wear her right KAFO, because she is unable to wear it while driving. She needs articulating AFO to allow patient adequate level plantar flexion to press down on gas pedal to avoid having to continually donn and doff brace. She has  adequate right ankle mobility required for this type of brace. PT encouraged pt to continue to where AFO for RLE to avoid hip internal rotation, because of knee instability, which is causing her hip pain. PT to contact referring provider about suggesting for articulating AFO. She will continue to benefit from PT to reduced falls risk, improve walkign tolerance, reduce pain, and reduce caregiver burden.       OBJECTIVE IMPAIRMENTS: decreased ROM, decreased strength, impaired sensation, impaired UE functional use, and pain.   ACTIVITY LIMITATIONS: carrying, lifting, bending, bathing, dressing, reach over head, and hygiene/grooming PARTICIPATION LIMITATIONS: meal prep, cleaning, laundry, shopping, and community activity PERSONAL FACTORS: Age, Education, Sex, Social background, Time since onset of injury/illness/exacerbation, Transportation, and 3+ comorbidities: Cerebral Palsy, HTN, T2DM are also affecting patient's functional outcome.  REHAB  POTENTIAL: Fair (timeline since knee surgery) CLINICAL DECISION MAKING: Medium  EVALUATION COMPLEXITY: complicated    GOALS: Goals reviewed with patient? YES  SHORT TERM GOALS: Target date: 11/08/23 Patient will report comprehension, confidence, and consistent compliance and of a simple home exercise program established to facilitate symptoms management and basic strengthening and/or segment mobility.    Baseline: NT 10/15/23: Performing exercises independently    Status: ACHIEVED    Patient to demonstrate improved score on LEFS by >9% to indicate reduced self-reported disability and/or pain.     Baseline: NOT MET     Status: 19/80 or 24%   11/11/23: 20/80 or  25%    LONG TERM GOALS: Target date: 02/13/24  1. Patient to improve score on LEFS by 20% or greater to indicate reduced disability and improved quality of life.    Baseline: 19/80 (24%) 11/11/23: 20/80 or 25% 12/04/23: 25%    Status: ONGOING    2. Pt to report no episodes of knee buckling in most recent 2 weeks to indicate improved stability ogf knee joint loading in ADL performance.   Baseline: twice weekly episodes  11/18/23: Right knee is not giving out but painful.  12/04/23 Not giving out and not pain   Status: ACHIEVED     3. Pt to report worse pain rating in most recent 2 weeks no greater than 6/10 to indicate greater stability of pain symptoms with daily activity/mobility.   Baseline: 8/10 worst pain 11/25/23: 7/10 feels achy now and it used to be sharp   Status: ONGOING   PLAN:  PT FREQUENCY: 1-2x/week PT DURATION: 8 weeks PLANNED INTERVENTIONS: 97164- PT Re-evaluation, 97110-Therapeutic exercises, 97530- Therapeutic activity, 97112- Neuromuscular re-education, 97535- Self Care, 02859- Manual therapy, [phone]- Gait training, [phone]- Orthotic Fit/training, [phone]- Prosthetic training, [phone]- Aquatic Therapy, [phone]- Electrical stimulation (unattended), [phone]- Electrical stimulation (manual), [id number]- Traction (mechanical),  Patient/Family education, Dry Needling, Joint mobilization, Joint manipulation, Spinal manipulation, Spinal mobilization, Cryotherapy, and Moist heat  PLAN FOR NEXT SESSION: Continue to work with weight bearing and ambulating using right AFO and analyze right hip internal rotation. Continue to focus on abdominal and hip strengthening

## 2024-01-20 ENCOUNTER — Other Ambulatory Visit (INDEPENDENT_AMBULATORY_CARE_PROVIDER_SITE_OTHER): Admitting: Pharmacist

## 2024-01-20 ENCOUNTER — Other Ambulatory Visit (INDEPENDENT_AMBULATORY_CARE_PROVIDER_SITE_OTHER): Payer: Self-pay | Admitting: Nurse Practitioner

## 2024-01-20 DIAGNOSIS — R109 Unspecified abdominal pain: Secondary | ICD-10-CM | POA: Diagnosis not present

## 2024-01-20 DIAGNOSIS — Z794 Long term (current) use of insulin: Secondary | ICD-10-CM | POA: Diagnosis not present

## 2024-01-20 DIAGNOSIS — E119 Type 2 diabetes mellitus without complications: Secondary | ICD-10-CM | POA: Diagnosis not present

## 2024-01-20 DIAGNOSIS — E1169 Type 2 diabetes mellitus with other specified complication: Secondary | ICD-10-CM

## 2024-01-20 DIAGNOSIS — R197 Diarrhea, unspecified: Secondary | ICD-10-CM | POA: Diagnosis not present

## 2024-01-20 DIAGNOSIS — Z7722 Contact with and (suspected) exposure to environmental tobacco smoke (acute) (chronic): Secondary | ICD-10-CM | POA: Diagnosis not present

## 2024-01-20 DIAGNOSIS — Z7984 Long term (current) use of oral hypoglycemic drugs: Secondary | ICD-10-CM | POA: Diagnosis not present

## 2024-01-20 DIAGNOSIS — I10 Essential (primary) hypertension: Secondary | ICD-10-CM | POA: Diagnosis not present

## 2024-01-20 DIAGNOSIS — Z79899 Other long term (current) drug therapy: Secondary | ICD-10-CM | POA: Diagnosis not present

## 2024-01-20 DIAGNOSIS — G808 Other cerebral palsy: Secondary | ICD-10-CM | POA: Diagnosis not present

## 2024-01-20 DIAGNOSIS — Z885 Allergy status to narcotic agent status: Secondary | ICD-10-CM | POA: Diagnosis not present

## 2024-01-20 DIAGNOSIS — R600 Localized edema: Secondary | ICD-10-CM

## 2024-01-20 DIAGNOSIS — K219 Gastro-esophageal reflux disease without esophagitis: Secondary | ICD-10-CM | POA: Diagnosis not present

## 2024-01-20 NOTE — Progress Notes (Unsigned)
 01/20/2024 Name: Traci Davis MRN: [id number] DOB: 1970-12-05  Chief Complaint  Patient presents with   Medication Management    RADLEY TESTON is a 53 y.o. year old female who presented for a telephone visit.   They were referred to the pharmacist by their PCP for assistance in managing complex medication management.      Subjective:   Care Team: Primary Care Provider: Edman Marsa PARAS, DO; Next Scheduled Visit: 05/25/2024 Neurology: Lane Arthea Locus, MD Pain Management: Orlando Gladies Goldsmith, MD  Psychiatrist: Eappen, Saramma, MD GI Specialist: Unk Corinn Skiff, MD  Pulmonologist: [facility] Endocrinologist: Damian Therisa Setter, MD; Next Scheduled Visit: 01/27/2024 Dietitian: Lovely Glenis LABOR, RD; Next Scheduled Visit: 02/03/2024   Medication Access/Adherence  Current Pharmacy:  [facility] [place], [place] - [address] [place] [address] Phone: [phone] Fax: [phone]   Patient reports affordability concerns with their medications: No  Patient reports access/transportation concerns to their pharmacy: No  Patient reports adherence concerns with their medications:  No     Using weekly pillbox as filled by her daughter   Reports rescheduled appointment with Endocrinology as had a conflict. Now scheduled for 9/29  Today patient shares that she has been having trouble with itching with use of her buprenorphine patches. Reports has sensitive skin, but also note patient has latex allergy. Reports has been using buprenorphine patches as prescribed by Pain Specialist since May and has similar itching since started use. Denies itching having gotten worse or improved since started. Reports has used steroid cream from her Dermatologist, which has helped relieve the itching. Patient also uses overpatches that she purchased through [place] to keep buprenorphine patches in place - Reviews box of overpatches today. She is  unable to find any listing of ingredients, but locates manufacturer phone number and plans to contact manufacturer when we hang up to find out if this product contains latex - Confirms has been rotating site for patches - During our call also trials applying buprenorphine patch alone (without overpatch) and denies noticing any itching for duration of our call. However, plans to try this longer. Patient aware to be careful to avoid any activities that could cause the patch to come off during this trial.   Diabetes:   Patient followed by [facility] End[facility]   Current medications:  - metformin  ER 500 - 2 tablets twice daily - Lantus  42 units daily   Medications tried in the past/contraindications: Jardiance  (yeast infection); history of pancreatitis    Patient using Dexcom G7 continuous glucose monitor Current glucose readings: recent readings ranging 250-300   Note patient seen by [facility] Dietitian, but reports recently having difficulty with adhering to dietitian's recommendations. Reports eating large portions of carbohydrates/sweets recently   Patient denies hypoglycemic s/sx including dizziness, shakiness, sweating.   Current physical activity: Doing neck exercises and exercises from physical therapy    Statin therapy: atorvastatin  20 mg daily   Objective:  Lab Results  Component Value Date   HGBA1C 9.6 (H) 09/04/2023    Lab Results  Component Value Date   CREATININE 0.88 05/31/2023   BUN 12 05/31/2023   NA 139 05/31/2023   K 3.9 05/31/2023   CL 98 05/31/2023   CO2 27 05/31/2023    Lab Results  Component Value Date   CHOL 226 (H) 09/04/2023   HDL 60 09/04/2023   LDLCALC 132 (H) 09/04/2023   TRIG 198 (H) 09/04/2023   CHOLHDL 3.8  09/04/2023    Current Outpatient Medications on File Prior to Visit  Medication Sig Dispense Refill   acetaminophen  (TYLENOL ) 325 MG tablet Take 2 tablets (650 mg total) by mouth every 6 (six) hours as needed. 60  tablet 0   albuterol  (PROVENTIL  HFA;VENTOLIN  HFA) 108 (90 BASE) MCG/ACT inhaler Inhale 2 puffs into the lungs 4 (four) times daily as needed. For shortness of breath and/or wheezing     amLODipine  (NORVASC ) 5 MG tablet Take 1 tablet (5 mg total) by mouth daily. 90 tablet 3   atorvastatin  (LIPITOR) 20 MG tablet Take 1 tablet (20 mg total) by mouth daily. 90 tablet 3   azelastine (ASTELIN) 0.1 % nasal spray Place into both nostrils.     Blood Glucose Monitoring Suppl (GLUCOCOM BLOOD GLUCOSE MONITOR) DEVI Test daily before all meals/snacks and once before bedtime.     budesonide-formoterol (SYMBICORT) 80-4.5 MCG/ACT inhaler Inhale 2 puffs up to 4 times daily as needed for symptoms of wheezing or shortness of breath.     buprenorphine (BUTRANS) 15 MCG/HR 1 patch once a week.     busPIRone  (BUSPAR ) 10 MG tablet Take 1 tablet (10 mg total) by mouth 3 (three) times daily. 270 tablet 1   Continuous Glucose Sensor (DEXCOM G7 SENSOR) MISC Use 1 sensor every 10 days for continuous glucose monitoring 9 each 3   CREON  36000-114000 units CPEP capsule Take 3 capsules (108,000 Units total) by mouth 3 (three) times daily with meals. 300 capsule 5   dicyclomine  (BENTYL ) 10 MG capsule Take 2 capsules in morning with meal and 1 capsule in evening with meal. 90 capsule 5   donepezil (ARICEPT) 10 MG tablet Take 10 mg by mouth at bedtime.     estradiol (ESTRACE) 0.1 MG/GM vaginal cream Place 1 Applicatorful vaginally 3 (three) times a week.     FLUoxetine  (PROZAC ) 20 MG capsule Take 1 capsule (20 mg total) by mouth daily. Take along with 40 mg daily - total of 60 mg daily 90 capsule 1   FLUoxetine  (PROZAC ) 40 MG capsule Take 1 capsule (40 mg total) by mouth daily. Take along with 20 mg daily 90 capsule 1   fluticasone  (FLONASE ) 50 MCG/ACT nasal spray Place 2 sprays into both nostrils daily. 48 g 3   furosemide  (LASIX ) 40 MG tablet Take 1 tablet (40 mg total) by mouth daily as needed for fluid. 30 tablet 5   gabapentin  (NEURONTIN) 300 MG capsule Take 600 mg by mouth 3 (three) times daily.     glucose blood test strip USE TO TEST BLOOD SUGAR TWO TIMES A DAY     GVOKE HYPOPEN  2-PACK 1 MG/0.2ML SOAJ Inject 1 mg into the skin as needed (hypoglycemia). 1 mL 0   incobotulinumtoxinA (XEOMIN) 100 units SOLR injection Inject 100 Units into the muscle every 3 (three) months.      Incontinence Supply Disposable (PREVAIL BLADDER CONTROL PAD) MISC Large incontinence pads to go into underwear     Insulin  Pen Needle (GLOBAL EASE INJECT PEN NEEDLES) 31G X 5 MM MISC Use pen needle to inject insulin  daily. 100 each 0   lamoTRIgine  (LAMICTAL ) 25 MG tablet Take 1 tablet (25 mg total) by mouth daily. 90 tablet 1   Lancets (FREESTYLE) lancets Test daily before all meals/snacks     LANTUS  SOLOSTAR 100 UNIT/ML Solostar Pen Inject 42 Units into the skin daily. 38 mL 1   lidocaine  (LIDODERM ) 5 % Place 2 patches onto the skin daily.     linaclotide LARUE)  72 MCG capsule Take 72 mcg by mouth daily before breakfast.     lisinopril  (ZESTRIL ) 20 MG tablet Take 1 tablet (20 mg total) by mouth daily. 90 tablet 3   metFORMIN  (GLUCOPHAGE -XR) 500 MG 24 hr tablet Take 2 tablets (1,000 mg total) by mouth 2 (two) times daily. 360 tablet 3   mirabegron  ER (MYRBETRIQ ) 50 MG TB24 tablet Take 1 tablet (50 mg total) by mouth daily. 30 tablet 3   mirtazapine  (REMERON ) 15 MG tablet Take 1.5 tablets (22.5 mg total) by mouth at bedtime. 135 tablet 1   montelukast  (SINGULAIR ) 10 MG tablet Take 1 tablet (10 mg total) by mouth at bedtime. 90 tablet 3   naloxone (NARCAN) nasal spray 4 mg/0.1 mL      nystatin  powder Apply 1 Application topically 3 (three) times daily. Until rash resolved, repeat use for another flare if needed 30 g 3   omeprazole  (PRILOSEC) 40 MG capsule Take 1 capsule (40 mg total) by mouth in the morning and at bedtime. 90 capsule 3   ondansetron  (ZOFRAN -ODT) 4 MG disintegrating tablet Take 1 tablet (4 mg total) by mouth every 8 (eight) hours  as needed for nausea or vomiting. 30 tablet 2   oxyCODONE -acetaminophen  (PERCOCET) 10-325 MG tablet Take 1 tablet by mouth 3 (three) times daily as needed.     SUMAtriptan (IMITREX) 100 MG tablet Take 100 mg by mouth as directed. As needed for migraines     triamcinolone cream (KENALOG) 0.1 % Apply 1 Application topically 3 (three) times daily as needed.     No current facility-administered medications on file prior to visit.       Assessment/Plan:   Counsel patient that some buprenorphine patch products may contain latex. Patient plans to contact her [facility] today to find out if the current product that she is using could contain latex and confirm that pharmacy has this allergy on file for her - If product does contain latex, will ask pharmacy if it is possible to order an alternative product that does not  Patient also plans to contact manufacturer of overpatches to see if these may contain latex. Patient will consider ordering alternative overpatches - Patient aware to discontinue if contains latex  Patient plans to discuss buprenorphine patch dosage form with Pain Management specialist at upcoming appointment on 10/1, but to outreach to provider sooner if needed regarding itching with patch  Diabetes: - Currently uncontrolled - Reviewed long term cardiovascular and renal outcomes of uncontrolled blood sugar - Reviewed goal A1c, goal fasting, and goal 2 hour post prandial glucose - Encourage patient to follow dietary recommendations from Dietitian. Have reviewed dietary modifications including importance of having regular well-balanced meals and snacks throughout the day, while controlling carbohydrate portion sizes  Discuss balanced carbohydrate-protein snack ideas - Recommend to continue to use Dexcom G7 CGM to monitor blood sugar/as feedback on dietary choices Patient to check glucose with fingerstick check when needed for symptoms and as back up to CGM. Patient to contact  office if needed for readings outside of established parameters or symptoms   Follow Up Plan: Clinical Pharmacist will follow up with patient by telephone on 02/24/2024 at 3:00 PM     Sharyle Sia, PharmD, [facility] [phone]

## 2024-01-21 ENCOUNTER — Other Ambulatory Visit: Payer: Self-pay

## 2024-01-21 ENCOUNTER — Ambulatory Visit: Admitting: Physical Therapy

## 2024-01-21 DIAGNOSIS — N950 Postmenopausal bleeding: Secondary | ICD-10-CM

## 2024-01-21 DIAGNOSIS — R262 Difficulty in walking, not elsewhere classified: Secondary | ICD-10-CM

## 2024-01-21 DIAGNOSIS — G8929 Other chronic pain: Secondary | ICD-10-CM | POA: Diagnosis not present

## 2024-01-21 DIAGNOSIS — M25561 Pain in right knee: Secondary | ICD-10-CM | POA: Diagnosis not present

## 2024-01-21 DIAGNOSIS — M25661 Stiffness of right knee, not elsewhere classified: Secondary | ICD-10-CM | POA: Diagnosis not present

## 2024-01-21 NOTE — Therapy (Signed)
 OUTPATIENT PHYSICAL THERAPY TREATMENT     Patient Name: SELINDA KORZENIEWSKI MRN: [id number] DOB:11/11/1970, 53 y.o., female Today's Date: 01/21/2024  END OF SESSION:  PT End of Session - 01/21/24 1531     Visit Number 19    Number of Visits 20    Date for Recertification  02/13/24    Authorization Type [employer] Medicare Dual (VL based on mn)    Authorization - Visit Number 19    Authorization - Number of Visits 20    Progress Note Due on Visit 20    PT Start Time 1515    PT Stop Time 1600    PT Time Calculation (min) 45 min    Activity Tolerance Patient tolerated treatment well    Behavior During Therapy [facility] for tasks assessed/performed                 Past Medical History:  Diagnosis Date   Acute pancreatitis 09/04/2014   Anxiety    Arthritis    Asthma    Cerebral palsy (HCC)    Complication of anesthesia    panic attacks before surgery   COPD (chronic obstructive pulmonary disease) (HCC)    Depression    Diabetes mellitus without complication (HCC)    GERD (gastroesophageal reflux disease)    Hypertension    Noninfectious gastroenteritis and colitis 01/10/2013   Past Surgical History:  Procedure Laterality Date   CESAREAN SECTION  1995   CHOLECYSTECTOMY     DILATATION & CURETTAGE/HYSTEROSCOPY WITH MYOSURE N/A 02/20/2018   Procedure: DILATATION & CURETTAGE/HYSTEROSCOPY WITH MYOSURE ENDOMETRIAL POLYPECTOMY;  Surgeon: Leonce Garnette BIRCH, MD;  Location: [facility];  Service: Gynecology;  Laterality: N/A;   ESOPHAGOGASTRODUODENOSCOPY (EGD) WITH PROPOFOL  N/A 01/01/2019   Procedure: ESOPHAGOGASTRODUODENOSCOPY (EGD) WITH PROPOFOL ;  Surgeon: Unk Corinn Skiff, MD;  Location: [facility];  Service: Gastroenterology;  Laterality: N/A;   FOOT CAPSULE RELEASE W/ PERCUTANEOUS HEEL CORD LENGTHENING, TIBIAL TENDON TRANSFER     age 68 ,and 2010-both legs   JOINT REPLACEMENT Right    both knees partial replacements dates unknown   knee replacment     x 5 per pt. last one- left  knee 2014. has had 2 on R 3 on L   TYMPANOPLASTY WITH GRAFT Right 01/08/2018   Procedure: TYMPANOPLASTY WITH POSSIBLE OSSICULAR CHAIN RECONSTRUCTION;  Surgeon: Blair Mt, MD;  Location: [facility];  Service: ENT;  Laterality: Right;   Patient Active Problem List   Diagnosis Date Noted   Urge urinary incontinence 04/02/2023   Incontinence of feces 04/02/2023   Vaginal atrophy 04/02/2023   Asthma, persistent controlled 02/06/2023   Continuous leakage of urine 01/04/2023   De Quervain's tenosynovitis, right 10/08/2022   Hot flashes due to menopause 07/17/2022   Convulsions (HCC) 07/09/2022   Obesity due to excess calories 09/22/2020   Insomnia due to medical condition 05/11/2020   Hamstring tendonitis 01/29/2020   MDD (major depressive disorder), recurrent, in full remission 08/12/2019   Seizure-like activity (HCC) 07/08/2019   Mild episode of recurrent major depressive disorder 12/29/2018   Insomnia due to mental condition 12/29/2018   Disorder of vein 03/04/2018   Lumbar sprain 03/04/2018   Muscle weakness 03/04/2018   Neck sprain 03/04/2018   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   Postmenopausal bleeding 02/18/2018   Impingement syndrome of shoulder region 02/04/2018   Chest pain with moderate risk for cardiac etiology 05/19/2017   GAD (generalized anxiety disorder) 04/15/2017   Chronic daily headache 04/15/2017   Panic attack 04/15/2017   Nocturnal  enuresis 04/15/2017   Mild cognitive impairment 04/15/2017   Loss of memory 01/14/2017   Pain medication agreement signed 01/08/2017   Headache disorder 12/04/2016   Chronic, continuous use of opioids 07/01/2015   Vertigo 07/01/2015   Chronic midline low back pain without sciatica 03/21/2015   Type 2 diabetes mellitus with other specified complication (HCC) 09/05/2014   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   Hip pain, chronic 04/28/2013   Essential hypertension 01/10/2013   Irritable colon 01/10/2013   Encounter for  long-term (current) use of other medications 12/04/2012   Chronic GERD 08/12/2012   Diarrhea 08/12/2012   Primary localized osteoarthrosis, lower leg 05/07/2012   Difficulty walking 02/06/2012    PCP: Dr. Marsa Officer  REFERRING PROVIDER: Dr. Suzen Brantley Salters  REFERRING DIAG: Cervicalgia  THERAPY DIAG:  Chronic pain of right knee  Difficulty in walking, not elsewhere classified  Rationale for Evaluation and Treatment: Rehabilitation ONSET DATE: Jan 2025  SUBJECTIVE:                                                                                                                                                                                                        SUBJECTIVE STATEMENT:  Pt states that the lateral joint line of her right knee is hurting her today.     PERTINENT HISTORY:  Pt reports insidious onset of Rt knee pain and twice weekly buckling episodes starting ~Jan 2025, has constant pain on the femur lateral to the patella. History of remote unicompartmental knee arthroplasty. Pt has not fallen due to this, but she is very concerned about a fall in the future due to her baseline mobility limitations. At eval pain is reported as 6/10 current, 4/10 best in past week, 8/10 worst in past week. Pt manages her pain at home with heat, ice, tylenol  arthritis, and/or percocet.   PAIN:  6-7/10 lateral joint line of right knee especially in weight bearing    PRECAUTIONS: None WEIGHT BEARING RESTRICTIONS: No FALLS:  Has patient fallen in last 6 months? No  OCCUPATION: On disability  PLOF: Independent  PATIENT GOALS: Decrease pain, avoid falling, abolish buckling episodes.   OBJECTIVE:  PATIENT SURVEYS:  LEFS: 25% or 20/80 12/04/23   ROM:  -Rt knee flexion: 105 degrees  -Rt knee extension: 24 degrees   STRENGTH ASSESSMENT:  -Rt knee extension: 5/5  -Rt hip extension (straight knee, tested in supine, 30 degrees flexion): 3+/5  TODAY'S TREATMENT:    01/21/24: THERAC   50 ft overground ambulating with use of 2WW upright posture   50 ft  overground ambulating while donning right ankle foot orthosis and use of 2WW upright posture around gym x 4    Quarter squat with BUE support using 2WW for support  1 x 10  -Walker slides away   Sit to Stand with BUE support using mat table 1 x 10   -Pt unable to perform without he left knee brace locking on her   Quarter squat with BUE support using mat table for support 2 x 10    -min VC to maintain knees behind toes Standing Marches on LLE with BUE support 2 x 10       THEREX   Right knee flexion and extension AROM using sliders 2 x 10    LEFS:   24/80 (30%) Bent Knee Fall Out with Blue Band 1 x 10 on RLE  Side Lying Modified Hip Abduction with knee flexed to 45 deg 2 x 10    PATIENT EDUCATION:  Education details: Explained how lacking 24 degrees of full extension can increase resting patella loading and increase risk of buckling.   Person educated: Patient Education method: collaborative learning, deliberate practice, positive reinforcement, explicit instruction, establish rules. Education comprehension: verbalized understanding, returned demonstration, verbal cues required, and tactile cues required  HOME EXERCISE PROGRAM: Access Code: [email or web address] URL: [email or web address] Date: 01/21/2024 Prepared by: Toribio Servant  Program Notes Seated Towel Slides 3 x 30 x 7 days per week   Exercises - Seated 3 Way Exercise Ball Roll Out Stretch  - 1 x daily - 7 x weekly - 2 sets - 10 reps - Supine Lower Trunk Rotation  - 1 x daily - 7 x weekly - 2 sets - 10 reps - 30 sec  hold - Seated Heel Slide  - 1 x daily - 7 x weekly - 2 sets - 10 reps - Supine Transversus Abdominis Bracing - Hands on Stomach  - 1 x daily - 7 x weekly - 2 sets - 10 reps - 3 sec hold - Seated Gastroc Stretch with Strap  - 1 x daily - 7 x weekly - 3 reps - 60 sec  hold - Seated Hamstring Stretch  - 1 x daily - 7  x weekly - 3 reps - 60 sec hold - Seated Quadratus Lumborum Stretch in Chair  - 1 x daily - 7 x weekly - 3 reps - 30-60 sec hold - Standing Marching  - 3-4 x weekly - 3 sets - 10 reps - Sitting Knee Extension with Resistance  - 3-4 x weekly - 3 sets - 10 reps - Mini Squat with Counter Support  - 3-4 x weekly - 3 sets - 10 reps - Seated Eccentric Abdominal Lean Back  - 3-4 x weekly - 3 sets - 10 reps - Sidelying Bent Knee Lift at 45 Degrees  - 1 x daily - 3-4 x weekly - 2 sets - 10 reps - Sidelying Bent Knee Lift at 45 Degrees (Mirrored)  - 1 x daily - 3-4 x weekly - 2 sets - 10 reps  ASSESSMENT: CLINICAL IMPRESSION:  Despite ongoing lateral joint line pain of right knee, pt continues to show improved activity tolerance, perception of right knee function and decreased right knee pain in weight bearing with using of ankle foot orthosis. Her pain is likely the result of ongoing knee valgus due to increased spasticity in hip adductors from cerebral palsey and limited use of right ankle foot orthosis to correct ankle position to decrease knee valgus.  PT continues to recommend  use of right ankle foot orthosis during all walking and weight bearing activity. PT to hold further therapy sessions until after patient has had f/u appointment with Dr. Nick to discuss further options for articulating brace to enable pt ability to not have to worry about donning and doffing brace when driving to improve compliance. She is to return at the end of October for re-evaluation. She will continue to benefit from PT to reduced falls risk, improve walkign tolerance, reduce pain, and reduce caregiver burden.     OBJECTIVE IMPAIRMENTS: decreased ROM, decreased strength, impaired sensation, impaired UE functional use, and pain.   ACTIVITY LIMITATIONS: carrying, lifting, bending, bathing, dressing, reach over head, and hygiene/grooming PARTICIPATION LIMITATIONS: meal prep, cleaning, laundry, shopping, and community  activity PERSONAL FACTORS: Age, Education, Sex, Social background, Time since onset of injury/illness/exacerbation, Transportation, and 3+ comorbidities: Cerebral Palsy, HTN, T2DM are also affecting patient's functional outcome.  REHAB POTENTIAL: Fair (timeline since knee surgery) CLINICAL DECISION MAKING: Medium  EVALUATION COMPLEXITY: complicated    GOALS: Goals reviewed with patient? YES  SHORT TERM GOALS: Target date: 11/08/23 Patient will report comprehension, confidence, and consistent compliance and of a simple home exercise program established to facilitate symptoms management and basic strengthening and/or segment mobility.    Baseline: NT 10/15/23: Performing exercises independently    Status: ACHIEVED    Patient to demonstrate improved score on LEFS by >9% to indicate reduced self-reported disability and/or pain.     Baseline: NOT MET     Status: 19/80 or 24%   11/11/23: 20/80 or  25%    LONG TERM GOALS: Target date: 02/13/24  1. Patient to improve score on LEFS by 20% or greater to indicate reduced disability and improved quality of life.    Baseline: 19/80 (24%) 11/11/23: 20/80 or 25% 12/04/23: 25%  01/21/24: 30% (24/80)  Status: ONGOING    2. Pt to report no episodes of knee buckling in most recent 2 weeks to indicate improved stability ogf knee joint loading in ADL performance.   Baseline: twice weekly episodes  11/18/23: Right knee is not giving out but painful.  12/04/23 Not giving out and not pain  01/21/24: No report of knee instability while walking.   Status: ACHIEVED     3. Pt to report worse pain rating in most recent 2 weeks no greater than 6/10 to indicate greater stability of pain symptoms with daily activity/mobility.   Baseline: 8/10 worst pain 11/25/23: 7/10 feels achy now and it used to be sharp 01/21/24:   Status: ONGOING   PLAN:  PT FREQUENCY: 1-2x/week PT DURATION: 8 weeks PLANNED INTERVENTIONS: 97164- PT Re-evaluation, 97110-Therapeutic exercises, 97530-  Therapeutic activity, 97112- Neuromuscular re-education, 97535- Self Care, [id number]- Manual therapy, [phone]- Gait training, [phone]- Orthotic Fit/training, [phone]- Prosthetic training, [phone]- Aquatic Therapy, [phone]- Electrical stimulation (unattended), [phone]- Electrical stimulation (manual), [id number]- Traction (mechanical), Patient/Family education, Dry Needling, Joint mobilization, Joint manipulation, Spinal manipulation, Spinal mobilization, Cryotherapy, and Moist heat  PLAN FOR NEXT SESSION: Re-evaluate long term goals and check in on exercises.

## 2024-01-22 ENCOUNTER — Encounter (INDEPENDENT_AMBULATORY_CARE_PROVIDER_SITE_OTHER)

## 2024-01-22 ENCOUNTER — Ambulatory Visit (INDEPENDENT_AMBULATORY_CARE_PROVIDER_SITE_OTHER)

## 2024-01-22 DIAGNOSIS — R600 Localized edema: Secondary | ICD-10-CM | POA: Diagnosis not present

## 2024-01-22 NOTE — Patient Instructions (Signed)
 Goals Addressed             This Visit's Progress    Pharmacy Goals       The goal A1c is less than 7%. This is the best way to reduce the risk of the long term complications of diabetes, including heart disease, kidney disease, eye disease, strokes, and nerve damage. An A1c of less than 7% corresponds with fasting sugars less than 130 and 2 hour after meal sugars less than 180.   Our goal bad cholesterol, or LDL, is less than 70 . This is why it is important to continue taking your atorvastatin.  Arthur Lash, PharmD, [facility] Clinical Pharmacist [facility] [phone]

## 2024-01-23 ENCOUNTER — Ambulatory Visit: Admitting: Physical Therapy

## 2024-01-26 NOTE — Progress Notes (Signed)
 Subjective:    Patient ID: Traci Davis, female    DOB: [birth year], 53 y.o.   MRN: [id number] Chief Complaint  Patient presents with   New Patient (Initial Visit)     - np. consult. Spastic diplegic cerebral palsy. Bilateral edema of lower extremity.  Traci Davis     The patient presents today for evaluation of bilateral lower extremity edema.  In addition she has complaints of reduced sensation in her lower extremities.  She has a history of cerebral palsy.  Her larger concern however is that she had a friend who had similar symptoms and was found to have a popliteal blockage which resulted in a stroke.  She notes that she is having leg swelling going on for some time now and it is helpful with elevation.  Currently there are no open wounds or ulcerations.  She describes classic claudication-like symptoms but she does have altered sensation in her lower extremities.    Review of Systems  Cardiovascular:  Positive for leg swelling.  Musculoskeletal:  Positive for arthralgias and gait problem.  Neurological:  Positive for numbness.  All other systems reviewed and are negative.      Objective:   Physical Exam Vitals reviewed.  HENT:     Head: Normocephalic.  Cardiovascular:     Rate and Rhythm: Normal rate.     Pulses: Normal pulses.  Pulmonary:     Effort: Pulmonary effort is normal.  Musculoskeletal:     Right lower leg: Edema present.     Left lower leg: Edema present.  Skin:    General: Skin is warm and dry.  Neurological:     Mental Status: She is alert and oriented to person, place, and time.     Motor: Weakness present.     Gait: Gait abnormal.  Psychiatric:        Mood and Affect: Mood normal.        Behavior: Behavior normal.        Thought Content: Thought content normal.        Judgment: Judgment normal.     BP 138/85   Pulse 88   Ht 5' 2 (1.575 m)   Wt 200 lb (90.7 kg)   LMP 08/20/2014 Comment: missed cup when trying to obtain urine  spec  BMI 36.58 kg/m   Past Medical History:  Diagnosis Date   Acute pancreatitis 09/04/2014   Anxiety    Arthritis    Asthma    Cerebral palsy (HCC)    Complication of anesthesia    panic attacks before surgery   COPD (chronic obstructive pulmonary disease) (HCC)    Depression    Diabetes mellitus without complication (HCC)    GERD (gastroesophageal reflux disease)    Hypertension    Noninfectious gastroenteritis and colitis 01/10/2013    Social History   Socioeconomic History   Marital status: Single    Spouse name: Not on file   Number of children: 1   Years of education: Not on file   Highest education level: Associate degree: occupational, Scientist, product/process development, or vocational program  Occupational History   Not on file  Tobacco Use   Smoking status: Never   Smokeless tobacco: Never  Vaping Use   Vaping status: Never Used  Substance and Sexual Activity   Alcohol use: No   Drug use: No   Sexual activity: Yes    Partners: Male    Birth control/protection: Condom  Other Topics Concern   Not on file  Social History Narrative   Not on file   Social Drivers of Health   Financial Resource Strain: Low Risk  (11/07/2023)   Received from [facility]   Overall Financial Resource Strain (CARDIA)    Difficulty of Paying Living Expenses: Not very hard  Food Insecurity: No Food Insecurity (11/07/2023)   Received from [facility]   Hunger Vital Sign    Within the past 12 months, you worried that your food would run out before you got the money to buy more.: Never true    Within the past 12 months, the food you bought just didn't last and you didn't have money to get more.: Never true  Transportation Needs: No Transportation Needs (11/07/2023)   Received from [facility] - Transportation    In the past 12 months, has lack of transportation kept you from medical appointments or from getting medications?: No    Lack of  Transportation (Non-Medical): No  Physical Activity: Insufficiently Active (05/09/2023)   Exercise Vital Sign    Days of Exercise per Week: 3 days    Minutes of Exercise per Session: 30 min  Stress: No Stress Concern Present (05/23/2023)   Received from [facility] - Occupational Stress Questionnaire    Feeling of Stress : Only a little  Social Connections: Socially Integrated (05/23/2023)   Received from [facility]   Social Connection and Isolation Panel    In a typical week, how many times do you talk on the phone with family, friends, or neighbors?: More than three times a week    How often do you get together with friends or relatives?: More than three times a week    How often do you attend church or religious services?: More than 4 times per year    Do you belong to any clubs or organizations such as church groups, unions, fraternal or athletic groups, or school groups?: Yes    How often do you attend meetings of the clubs or organizations you belong to?: More than 4 times per year    Are you married, widowed, divorced, separated, never married, or living with a partner?: Living with partner  Recent Concern: Social Connections - Moderately Isolated (05/09/2023)   Social Connection and Isolation Panel    Frequency of Communication with Friends and Family: Three times a week    Frequency of Social Gatherings with Friends and Family: Twice a week    Attends Religious Services: More than 4 times per year    Active Member of [employer] or Organizations: No    Attends Engineer, structural: Not on file    Marital Status: Never married  Intimate Partner Violence: Not At Risk (05/23/2023)   Received from [facility]   Humiliation, Afraid, Rape, and Kick questionnaire    Within the last year, have you been afraid of your partner or ex-partner?: No    Within the last year, have you been humiliated or emotionally abused in other ways by  your partner or ex-partner?: No    Within the last year, have you been kicked, hit, slapped, or otherwise physically hurt by your partner or ex-partner?: No    Within the last year, have you been raped or forced to have any kind of sexual activity by your partner or ex-partner?: No    Past Surgical History:  Procedure Laterality Date   CESAREAN SECTION  1995  CHOLECYSTECTOMY     DILATATION & CURETTAGE/HYSTEROSCOPY WITH MYOSURE N/A 02/20/2018   Procedure: DILATATION & CURETTAGE/HYSTEROSCOPY WITH MYOSURE ENDOMETRIAL POLYPECTOMY;  Surgeon: Leonce Garnette BIRCH, MD;  Location: [facility] ORS;  Service: Gynecology;  Laterality: N/A;   ESOPHAGOGASTRODUODENOSCOPY (EGD) WITH PROPOFOL  N/A 01/01/2019   Procedure: ESOPHAGOGASTRODUODENOSCOPY (EGD) WITH PROPOFOL ;  Surgeon: Unk Corinn Skiff, MD;  Location: [facility] ENDOSCOPY;  Service: Gastroenterology;  Laterality: N/A;   FOOT CAPSULE RELEASE W/ PERCUTANEOUS HEEL CORD LENGTHENING, TIBIAL TENDON TRANSFER     age 36 ,and 2010-both legs   JOINT REPLACEMENT Right    both knees partial replacements dates unknown   knee replacment     x 5 per pt. last one- left knee 2014. has had 2 on R 3 on L   TYMPANOPLASTY WITH GRAFT Right 01/08/2018   Procedure: TYMPANOPLASTY WITH POSSIBLE OSSICULAR CHAIN RECONSTRUCTION;  Surgeon: Blair Mt, MD;  Location: [facility] ORS;  Service: ENT;  Laterality: Right;    Family History  Problem Relation Age of Onset   CAD Father    Heart attack Brother    Sexual abuse Paternal Grandfather    Breast cancer Neg Hx     Allergies  Allergen Reactions   Meloxicam Other (See Comments)    Damage kidney   Morphine And Codeine Other (See Comments)    hallucinations   Vantin [Cefpodoxime] Nausea And Vomiting   Latex Itching   Baclofen Other (See Comments)    makes cerebral palsy do adverse reaction on me, tightens muscles    Ciprofloxacin  Itching and Nausea And Vomiting   Prednisone Other (See Comments)    Elevated blood glucose   Tape  Rash    skin tears.  Paper tape is ok   Tramadol Nausea Only       Latest Ref Rng & Units 05/31/2023    2:08 PM 01/17/2023    2:57 PM 02/19/2020    4:01 PM  CBC  WBC 3.8 - 10.8 Thousand/uL 11.0  11.6  9.1   Hemoglobin 11.7 - 15.5 g/dL 87.1  86.7  85.3   Hematocrit 35.0 - 45.0 % 39.9  40.7  44.0   Platelets 140 - 400 Thousand/uL 336  333  240       CMP     Component Value Date/Time   NA 139 05/31/2023 1408   NA 134 (L) 04/29/2014 1827   K 3.9 05/31/2023 1408   K 3.8 04/29/2014 1827   CL 98 05/31/2023 1408   CL 103 04/29/2014 1827   CO2 27 05/31/2023 1408   CO2 26 04/29/2014 1827   GLUCOSE 264 (H) 05/31/2023 1408   GLUCOSE 156 (H) 04/29/2014 1827   BUN 12 05/31/2023 1408   BUN 8 04/29/2014 1827   CREATININE 0.88 05/31/2023 1408   CALCIUM  9.1 05/31/2023 1408   CALCIUM  8.5 04/29/2014 1827   PROT 6.7 05/31/2023 1408   PROT 7.9 04/15/2014 1736   ALBUMIN 3.7 10/05/2018 1852   ALBUMIN 3.4 04/15/2014 1736   AST 21 05/31/2023 1408   AST 15 04/15/2014 1736   ALT 26 05/31/2023 1408   ALT 23 04/15/2014 1736   ALKPHOS 103 10/05/2018 1852   ALKPHOS 126 (H) 04/15/2014 1736   BILITOT 0.3 05/31/2023 1408   BILITOT 0.3 04/15/2014 1736   EGFR 79 05/31/2023 1408   GFRNONAA >60 01/17/2023 1457   GFRNONAA >60 04/29/2014 1827   GFRNONAA 58 (L) 01/14/2014 0343     No results found.     Assessment & Plan:   1. Leg swelling (  Primary) Had a long discussion with her about leg swelling.  We discussed systemic issues versus vascular issues.  Given the patient's history of cerebral palsy with some associated muscle weakness I suspect this is a larger cause of her swelling.  However we will will evaluate if there is some underlying venous insufficiency.  We have discussed that conservative therapy is the hallmark for treating edema including use of medical grade compression, elevation and activity. 2. Essential hypertension Continue antihypertensive medications as already ordered, these  medications have been reviewed and there are no changes at this time.  3. 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(HCC) I suspect this is the overall cause of her edema in addition to the reduced sensation.  4. Reduced sensation The patient has complaints of reduced sensation in the lower extremities.  However her larger concern is regarding circulation as she has previously had a friend who had a stroke due to popliteal blockages.  We will evaluate ABIs.   Current Outpatient Medications on File Prior to Visit  Medication Sig Dispense Refill   acetaminophen  (TYLENOL ) 325 MG tablet Take 2 tablets (650 mg total) by mouth every 6 (six) hours as needed. 60 tablet 0   albuterol  (PROVENTIL  HFA;VENTOLIN  HFA) 108 (90 BASE) MCG/ACT inhaler Inhale 2 puffs into the lungs 4 (four) times daily as needed. For shortness of breath and/or wheezing     amLODipine  (NORVASC ) 5 MG tablet Take 1 tablet (5 mg total) by mouth daily. 90 tablet 3   atorvastatin  (LIPITOR) 20 MG tablet Take 1 tablet (20 mg total) by mouth daily. 90 tablet 3   azelastine (ASTELIN) 0.1 % nasal spray Place into both nostrils.     Blood Glucose Monitoring Suppl (GLUCOCOM BLOOD GLUCOSE MONITOR) DEVI Test daily before all meals/snacks and once before bedtime.     budesonide-formoterol (SYMBICORT) 80-4.5 MCG/ACT inhaler Inhale 2 puffs up to 4 times daily as needed for symptoms of wheezing or shortness of breath.     buprenorphine (BUTRANS) 15 MCG/HR 1 patch once a week.     busPIRone  (BUSPAR ) 10 MG tablet Take 1 tablet (10 mg total) by mouth 3 (three) times daily. 270 tablet 1   Continuous Glucose Sensor (DEXCOM G7 SENSOR) MISC Use 1 sensor every 10 days for continuous glucose monitoring 9 each 3   CREON  36000-114000 units CPEP capsule Take 3 capsules (108,000 Units total) by mouth 3 (three) times daily with meals. 300 capsule 5   dicyclomine  (BENTYL ) 10 MG capsule Take 2 capsules in morning with meal and 1 capsule in evening with meal. 90 capsule 5    donepezil (ARICEPT) 10 MG tablet Take 10 mg by mouth at bedtime.     estradiol (ESTRACE) 0.1 MG/GM vaginal cream Place 1 Applicatorful vaginally 3 (three) times a week.     FLUoxetine  (PROZAC ) 20 MG capsule Take 1 capsule (20 mg total) by mouth daily. Take along with 40 mg daily - total of 60 mg daily 90 capsule 1   FLUoxetine  (PROZAC ) 40 MG capsule Take 1 capsule (40 mg total) by mouth daily. Take along with 20 mg daily 90 capsule 1   fluticasone  (FLONASE ) 50 MCG/ACT nasal spray Place 2 sprays into both nostrils daily. 48 g 3   furosemide  (LASIX ) 40 MG tablet Take 1 tablet (40 mg total) by mouth daily as needed for fluid. 30 tablet 5   gabapentin (NEURONTIN) 300 MG capsule Take 600 mg by mouth 3 (three) times daily.     glucose blood test strip USE TO  TEST BLOOD SUGAR TWO TIMES A DAY     GVOKE HYPOPEN  2-PACK 1 MG/0.2ML SOAJ Inject 1 mg into the skin as needed (hypoglycemia). 1 mL 0   incobotulinumtoxinA (XEOMIN) 100 units SOLR injection Inject 100 Units into the muscle every 3 (three) months.      Incontinence Supply Disposable (PREVAIL BLADDER CONTROL PAD) MISC Large incontinence pads to go into underwear     Insulin  Pen Needle (GLOBAL EASE INJECT PEN NEEDLES) 31G X 5 MM MISC Use pen needle to inject insulin  daily. 100 each 0   lamoTRIgine  (LAMICTAL ) 25 MG tablet Take 1 tablet (25 mg total) by mouth daily. 90 tablet 1   Lancets (FREESTYLE) lancets Test daily before all meals/snacks     LANTUS  SOLOSTAR 100 UNIT/ML Solostar Pen Inject 42 Units into the skin daily. 38 mL 1   lidocaine  (LIDODERM ) 5 % Place 2 patches onto the skin daily.     linaclotide (LINZESS) 72 MCG capsule Take 72 mcg by mouth daily before breakfast.     lisinopril  (ZESTRIL ) 20 MG tablet Take 1 tablet (20 mg total) by mouth daily. 90 tablet 3   metFORMIN  (GLUCOPHAGE -XR) 500 MG 24 hr tablet Take 2 tablets (1,000 mg total) by mouth 2 (two) times daily. 360 tablet 3   mirabegron  ER (MYRBETRIQ ) 50 MG TB24 tablet Take 1 tablet (50 mg  total) by mouth daily. 30 tablet 3   mirtazapine  (REMERON ) 15 MG tablet Take 1.5 tablets (22.5 mg total) by mouth at bedtime. 135 tablet 1   montelukast  (SINGULAIR ) 10 MG tablet Take 1 tablet (10 mg total) by mouth at bedtime. 90 tablet 3   naloxone (NARCAN) nasal spray 4 mg/0.1 mL      nystatin  powder Apply 1 Application topically 3 (three) times daily. Until rash resolved, repeat use for another flare if needed 30 g 3   omeprazole  (PRILOSEC) 40 MG capsule Take 1 capsule (40 mg total) by mouth in the morning and at bedtime. 90 capsule 3   ondansetron  (ZOFRAN -ODT) 4 MG disintegrating tablet Take 1 tablet (4 mg total) by mouth every 8 (eight) hours as needed for nausea or vomiting. 30 tablet 2   oxyCODONE -acetaminophen  (PERCOCET) 10-325 MG tablet Take 1 tablet by mouth 3 (three) times daily as needed.     SUMAtriptan (IMITREX) 100 MG tablet Take 100 mg by mouth as directed. As needed for migraines     triamcinolone cream (KENALOG) 0.1 % Apply 1 Application topically 3 (three) times daily as needed.     No current facility-administered medications on file prior to visit.    There are no Patient Instructions on file for this visit. No follow-ups on file.   Jian Hodgman E Steaven Wholey, NP

## 2024-01-27 ENCOUNTER — Encounter (INDEPENDENT_AMBULATORY_CARE_PROVIDER_SITE_OTHER): Payer: Self-pay | Admitting: Nurse Practitioner

## 2024-01-27 DIAGNOSIS — E1165 Type 2 diabetes mellitus with hyperglycemia: Secondary | ICD-10-CM | POA: Diagnosis not present

## 2024-01-27 DIAGNOSIS — Z794 Long term (current) use of insulin: Secondary | ICD-10-CM | POA: Diagnosis not present

## 2024-01-27 DIAGNOSIS — E1159 Type 2 diabetes mellitus with other circulatory complications: Secondary | ICD-10-CM | POA: Diagnosis not present

## 2024-01-27 DIAGNOSIS — E785 Hyperlipidemia, unspecified: Secondary | ICD-10-CM | POA: Diagnosis not present

## 2024-01-27 DIAGNOSIS — E1169 Type 2 diabetes mellitus with other specified complication: Secondary | ICD-10-CM | POA: Diagnosis not present

## 2024-01-27 DIAGNOSIS — I152 Hypertension secondary to endocrine disorders: Secondary | ICD-10-CM | POA: Diagnosis not present

## 2024-01-27 DIAGNOSIS — Z8719 Personal history of other diseases of the digestive system: Secondary | ICD-10-CM | POA: Diagnosis not present

## 2024-01-28 ENCOUNTER — Ambulatory Visit (INDEPENDENT_AMBULATORY_CARE_PROVIDER_SITE_OTHER): Admitting: Nurse Practitioner

## 2024-01-28 ENCOUNTER — Ambulatory Visit: Admitting: Physical Therapy

## 2024-01-29 ENCOUNTER — Other Ambulatory Visit: Payer: Self-pay | Admitting: Psychiatry

## 2024-01-29 DIAGNOSIS — G801 Spastic diplegic cerebral palsy: Secondary | ICD-10-CM | POA: Diagnosis not present

## 2024-01-29 DIAGNOSIS — M7061 Trochanteric bursitis, right hip: Secondary | ICD-10-CM | POA: Diagnosis not present

## 2024-01-29 DIAGNOSIS — F411 Generalized anxiety disorder: Secondary | ICD-10-CM

## 2024-01-29 DIAGNOSIS — M51369 Other intervertebral disc degeneration, lumbar region without mention of lumbar back pain or lower extremity pain: Secondary | ICD-10-CM | POA: Diagnosis not present

## 2024-01-29 DIAGNOSIS — M25562 Pain in left knee: Secondary | ICD-10-CM | POA: Diagnosis not present

## 2024-01-29 DIAGNOSIS — M25561 Pain in right knee: Secondary | ICD-10-CM | POA: Diagnosis not present

## 2024-01-29 DIAGNOSIS — F3342 Major depressive disorder, recurrent, in full remission: Secondary | ICD-10-CM

## 2024-01-29 DIAGNOSIS — M542 Cervicalgia: Secondary | ICD-10-CM | POA: Diagnosis not present

## 2024-01-30 ENCOUNTER — Ambulatory Visit: Admitting: Physical Therapy

## 2024-02-03 ENCOUNTER — Telehealth: Admitting: Dietician

## 2024-02-04 ENCOUNTER — Encounter: Admitting: Physical Therapy

## 2024-02-06 ENCOUNTER — Telehealth: Payer: Self-pay | Admitting: Obstetrics & Gynecology

## 2024-02-06 ENCOUNTER — Encounter: Admitting: Physical Therapy

## 2024-02-06 NOTE — Telephone Encounter (Signed)
 Called patient to give her the telephone number to scheduled an ultrasound per Dr. Starla.  She will need to call Centralized [phone] option 3)

## 2024-02-09 ENCOUNTER — Other Ambulatory Visit: Payer: Self-pay | Admitting: Family Medicine

## 2024-02-09 DIAGNOSIS — K219 Gastro-esophageal reflux disease without esophagitis: Secondary | ICD-10-CM

## 2024-02-10 NOTE — Progress Notes (Signed)
 Traci Davis                                          MRN: [id number]   02/10/2024   The [facility] Quality Team Specialist reviewed this patient medical record for the purposes of chart review for care gap closure. The following were reviewed: chart review for care gap closure-glycemic status assessment.    [facility] Quality Team

## 2024-02-11 ENCOUNTER — Ambulatory Visit: Admitting: Obstetrics & Gynecology

## 2024-02-11 ENCOUNTER — Encounter: Admitting: Physical Therapy

## 2024-02-11 NOTE — Telephone Encounter (Signed)
 Requested Prescriptions  Refused Prescriptions Disp Refills   omeprazole  (PRILOSEC) 40 MG capsule [Pharmacy Med Name: Omeprazole  40 MG Oral Capsule Delayed Release] 200 capsule 2    Sig: TAKE 1 CAPSULE BY MOUTH TWICE  DAILY IN THE MORNING AND AT  BEDTIME     Gastroenterology: Proton Pump Inhibitors Passed - 02/11/2024  1:55 PM      Passed - Valid encounter within last 12 months    Recent Outpatient Visits           1 month ago 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[facility])   [facility] [place], Marsa PARAS, DO   4 months ago 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[facility])    [facility] [place], Marsa PARAS, DO   5 months ago COVID-19   [facility] [place], Marsa PARAS, DO   5 months ago Exposure to confirmed case of COVID-19   [facility] [place], Angeline [place]N, [place]

## 2024-02-13 ENCOUNTER — Encounter: Admitting: Physical Therapy

## 2024-02-14 NOTE — Patient Instructions (Incomplete)
 Menopause: What to Know Menopause is the time in your life when your menstrual periods stop. It marks the end of your ability to get pregnant. It can be defined as not having a period for 12 months without another medical cause. The time when you start to move into menopause is called perimenopause. It often happens between ages 67-55. It can last for many years. During perimenopause, hormone levels change in your body. This can cause symptoms and affect your health. Menopause may make you more likely to have: Bones that are weak and break more easily. Depression. This is when you feel sad or hopeless. Arteries that harden and get narrow. These can cause heart attacks and strokes. What are the causes? In most cases, menopause is a natural change to your body and hormone levels that happens as you get older. But in some cases, it may be caused by changes that aren't natural. These include: Surgery to take out both ovaries. Side effects from some medicines. What increases the risk? You're more likely to go through menopause early if: You have an abnormal growth (tumor) of the pituitary gland in your brain. You have a disease that affects your ovaries. You've had certain treatments for cancer. These include: Chemotherapy. Hormone therapy. Radiation therapy on the area between your hips (pelvis). You smoke a lot or drink a lot of alcohol. Other people in your family have gone through menopause early. You're very thin. What are the signs or symptoms? You may have: Hot flashes. Irregular periods. Night sweats. Changes in how you feel about sex. You may: Have less of a sex drive. Feel more discomfort around your sexuality. Vaginal dryness and thinning of the vaginal walls. This may make it hurt to have sex. Skin changes, such as: Dry skin. New wrinkles. Headaches. Other symptoms may include: Trouble sleeping. Mood swings. Memory problems. Weight gain. Hair growth on your face and  chest. Bladder infections or trouble peeing. How is this diagnosed? You may be diagnosed based on: Your medical history. An exam. Your age. Your history of menstrual periods. Your symptoms. Hormone tests. How is this treated? In some cases, no treatment is needed. Talk with your health care provider about if you should get treated. Treatments may include: Menopausal hormone therapy (MHT). Medicines to treat certain symptoms. Acupuncture. Vitamin or herbal supplements. Before you start treatment, let your provider know if you or anyone in your family has or has had: Heart disease. Breast cancer. Blood clots. Diabetes. Osteoporosis. Follow these instructions at home: Eating and drinking  Eat a balanced diet. It should include: Fresh fruits and vegetables. Whole grains. Lean protein. Low-fat dairy. Eat lots of foods that have calcium and vitamin D in them. These can help keep your bones healthy. Foods and drinks that are rich in calcium include: Yogurt and low-fat milk. Beans. Almonds. Sardines. Broccoli and kale. To help prevent hot flashes, stay away from: Alcohol. Drinks with caffeine in them. Spicy foods. Lifestyle Do not smoke, vape, or use nicotine or tobacco. Get 7-8 hours of sleep each night. If you have hot flashes, you may want to: Dress in layers. Avoid things that may trigger hot flashes, like warm places or stress. Take slow, deep breaths when a hot flash starts. Keep a fan in your home and office. Find ways to manage stress. You may want to try: Deep breathing. Meditation. Writing in a journal. Ask your provider about going to group therapy. Therapy can help you get support from others who are going  through menopause. General instructions  Talk with your provider before you take any herbal supplements. Keep track of your symptoms. Track: When they start. How often you have them. How long they last. Use vaginal lubricants or moisturizers. These  can help with: Vaginal dryness. Comfort during sex. Contact a health care provider if: You're older than 55 and still get periods. You have pain during sex. You haven't had a period for 12 months and then start to bleed from your vagina. It hurts to pee. You get very bad headaches. Get help right away if: You're very depressed. You have a lot of bleeding from your vagina. Your heart is beating too fast. You have very bad belly pain or indigestion that doesn't go away with medicines. This information is not intended to replace advice given to you by your health care provider. Make sure you discuss any questions you have with your health care provider. Document Revised: 12/20/2022 Document Reviewed: 12/20/2022 Elsevier Patient Education  2024 [employer].

## 2024-02-14 NOTE — Progress Notes (Deleted)
    GYNECOLOGY PROGRESS NOTE  Subjective:    Patient ID: Traci Davis, female    DOB: 03/03/1971, 53 y.o.   MRN: [id number]  HPI  Patient is a 53 y.o. G40P1001 female who presents for evaluation for menopause.  {Common ambulatory SmartLinks:19316}  Review of Systems {ros; complete:30496}   Objective:   Last menstrual period 08/20/2014. There is no height or weight on file to calculate BMI. General appearance: {general exam:16600} Abdomen: {abdominal exam:16834} Pelvic: {pelvic exam:16852::cervix normal in appearance,external genitalia normal,no adnexal masses or tenderness,no cervical motion tenderness,rectovaginal septum normal,uterus normal size, shape, and consistency,vagina normal without discharge} Extremities: {extremity exam:5109} Neurologic: {neuro exam:17854}   Assessment:   No diagnosis found.   Plan:   There are no diagnoses linked to this encounter.     Harland Birkenhead, MD

## 2024-02-17 ENCOUNTER — Ambulatory Visit: Admitting: Dietician

## 2024-02-17 ENCOUNTER — Ambulatory Visit: Admitting: Obstetrics & Gynecology

## 2024-02-18 ENCOUNTER — Encounter: Admitting: Physical Therapy

## 2024-02-18 DIAGNOSIS — R29898 Other symptoms and signs involving the musculoskeletal system: Secondary | ICD-10-CM | POA: Diagnosis not present

## 2024-02-18 DIAGNOSIS — G801 Spastic diplegic cerebral palsy: Secondary | ICD-10-CM | POA: Diagnosis not present

## 2024-02-18 DIAGNOSIS — M21542 Acquired clubfoot, left foot: Secondary | ICD-10-CM | POA: Diagnosis not present

## 2024-02-18 DIAGNOSIS — R269 Unspecified abnormalities of gait and mobility: Secondary | ICD-10-CM | POA: Diagnosis not present

## 2024-02-20 ENCOUNTER — Encounter: Admitting: Physical Therapy

## 2024-02-24 ENCOUNTER — Other Ambulatory Visit: Admitting: Pharmacist

## 2024-02-24 DIAGNOSIS — Z794 Long term (current) use of insulin: Secondary | ICD-10-CM

## 2024-02-24 DIAGNOSIS — E1169 Type 2 diabetes mellitus with other specified complication: Secondary | ICD-10-CM

## 2024-02-24 NOTE — Patient Instructions (Signed)
 Goals Addressed             This Visit's Progress    Pharmacy Goals       The goal A1c is less than 7%. This is the best way to reduce the risk of the long term complications of diabetes, including heart disease, kidney disease, eye disease, strokes, and nerve damage. An A1c of less than 7% corresponds with fasting sugars less than 130 and 2 hour after meal sugars less than 180.   Our goal bad cholesterol, or LDL, is less than 70 . This is why it is important to continue taking your atorvastatin.  Arthur Lash, PharmD, [facility] Clinical Pharmacist [facility] [phone]

## 2024-02-24 NOTE — Progress Notes (Signed)
 02/24/2024 Name: Traci Davis MRN: [id number] DOB: 08-May-1970  Chief Complaint  Davis presents with   Medication Management   Medication Adherence    Traci Davis is a 53 y.o. year old female who presented for a telephone visit.   They were referred to the pharmacist by their PCP for assistance in managing complex medication management.      Subjective:   Care Team: Primary Care Provider: Edman Marsa PARAS, DO; Next Scheduled Visit: 05/25/2024 Neurology: Lane Arthea Locus, MD Pain Management: Orlando Gladies Goldsmith, MD  Psychiatrist: Eappen, Saramma, MD GI Specialist: Unk Corinn Skiff, MD  Pulmonologist: [facility] Endocrinologist: Damian Therisa Setter, MD; Next Scheduled Visit: 04/02/2024 Dietitian: Lovely Glenis LABOR, RD  Medication Access/Adherence  Current Pharmacy:  [facility] [place] - [address] [place] [address] Phone: [phone] Fax: [phone]  [facility] - [place], [place] - [address] [place] [address] Phone: [phone] Fax: [phone]   Davis reports affordability concerns with their medications: No  Davis reports access/transportation concerns to their pharmacy: No  Davis reports adherence concerns with their medications:  No  , but reports currently out of her Dexcom G7 sensor as misplaced her last sensor and too soon to fill through her insurance until next week   Diabetes:   Davis followed by [facility] Endocrinology   Current medications:  - metformin  ER 500 - 2 tablets twice daily - Lantus  46 units daily - Humalog 10 units three times daily before meals   Medications tried in the past/contraindications: Jardiance  (yeast infection); history of pancreatitis    Davis has been using Dexcom G7 continuous glucose monitor, but reports currently out of sensors. Using glucometer to monitor blood  sugar Current glucose readings: today after breakfast (sausage, egg and cheese biscuit) and snacking on cheese doodles: 239   Note Davis previously seen by [facility] Dietitian, but latest appointment was canceled   Davis denies hypoglycemic s/sx including dizziness, shakiness, sweating.   Current physical activity: Doing neck exercises and exercises from physical therapy    Statin therapy: atorvastatin  20 mg daily   Objective:  Lab Results  Component Value Date   HGBA1C 9.6 (H) 09/04/2023    Lab Results  Component Value Date   CREATININE 0.88 05/31/2023   BUN 12 05/31/2023   NA 139 05/31/2023   K 3.9 05/31/2023   CL 98 05/31/2023   CO2 27 05/31/2023    Lab Results  Component Value Date   CHOL 226 (H) 09/04/2023   HDL 60 09/04/2023   LDLCALC 132 (H) 09/04/2023   TRIG 198 (H) 09/04/2023   CHOLHDL 3.8 09/04/2023    Medications Reviewed Today     Reviewed by Alana Sharyle LABOR, RPH-CPP (Pharmacist) on 02/24/24 at 1644  Med List Status: <None>   Medication Order Taking? Sig Documenting Provider Last Dose Status Informant  acetaminophen  (TYLENOL ) 325 MG tablet [id number]  Take 2 tablets (650 mg total) by mouth every 6 (six) hours as needed. Viviann Pastor, MD  Active   albuterol  (PROVENTIL  HFA;VENTOLIN  HFA) 108 (90 BASE) MCG/ACT inhaler [id number]  Inhale 2 puffs into the lungs 4 (four) times daily as needed. For shortness of breath and/or wheezing [provider]  Active Self           Med Note LUNA EDD Kitchens Jul 08, 2017  5:44 PM)    amLODipine  (NORVASC ) 5  MG tablet [id number]  Take 1 tablet (5 mg total) by mouth daily. Karamalegos, Marsa PARAS, DO  Active   atorvastatin  (LIPITOR) 20 MG tablet [id number]  Take 1 tablet (20 mg total) by mouth daily. Karamalegos, Marsa PARAS, DO  Active   azelastine (ASTELIN) 0.1 % nasal spray [id number]  Place into both nostrils. [provider]  Active   Blood Glucose Monitoring Suppl (GLUCOCOM BLOOD  GLUCOSE MONITOR) DEVI [id number]  Test daily before all meals/snacks and once before bedtime. [provider]  Active Self  budesonide-formoterol (SYMBICORT) 80-4.5 MCG/ACT inhaler [id number]  Inhale 2 puffs up to 4 times daily as needed for symptoms of wheezing or shortness of breath. [provider]  Active   buprenorphine (BUTRANS) 15 MCG/HR [id number]  1 patch once a week. [provider]  Active   busPIRone  (BUSPAR ) 10 MG tablet [id number]  Take 1 tablet (10 mg total) by mouth 3 (three) times daily. Eappen, Saramma, MD  Active   Continuous Glucose Sensor (DEXCOM G7 SENSOR) MISC [id number]  Use 1 sensor every 10 days for continuous glucose monitoring Karamalegos, Alexander J, DO  Active   CREON  36000-114000 units CPEP capsule [id number]  Take 3 capsules (108,000 Units total) by mouth 3 (three) times daily with meals. Edman Marsa PARAS, DO  Active   dicyclomine  (BENTYL ) 10 MG capsule [id number]  Take 2 capsules in morning with meal and 1 capsule in evening with meal. Edman, Marsa PARAS, DO  Active   donepezil (ARICEPT) 10 MG tablet [id number]  Take 10 mg by mouth at bedtime. [provider]  Active   estradiol (ESTRACE) 0.1 MG/GM vaginal cream [id number]  Place 1 Applicatorful vaginally 3 (three) times a week. [provider]  Active   FLUoxetine  (PROZAC ) 20 MG capsule [id number]  Take 1 capsule (20 mg total) by mouth daily. Take along with 40 mg daily - total of 60 mg daily Eappen, Saramma, MD  Active   FLUoxetine  (PROZAC ) 40 MG capsule [id number]  Take 1 capsule (40 mg total) by mouth daily. Take along with 20 mg daily Eappen, Saramma, MD  Active   fluticasone  (FLONASE ) 50 MCG/ACT nasal spray [id number]  Place 2 sprays into both nostrils daily. Edman Marsa PARAS, DO  Active   furosemide  (LASIX ) 40 MG tablet [id number]  Take 1 tablet (40 mg total) by mouth daily as needed for fluid. Karamalegos, Marsa PARAS, DO  Active   gabapentin (NEURONTIN) 300  MG capsule [id number]  Take 600 mg by mouth 3 (three) times daily. [provider]  Active   glucose blood test strip [id number]  USE TO TEST BLOOD SUGAR TWO TIMES A DAY [provider]  Active   GVOKE HYPOPEN  2-PACK 1 MG/0.2ML SOAJ [id number]  Inject 1 mg into the skin as needed (hypoglycemia). Edman Marsa PARAS, DO  Active   HUMALOG KWIKPEN 100 UNIT/ML KwikPen [id number] Yes Inject 10 Units under the skin Three (3) times a day before meals. [provider]  Active   incobotulinumtoxinA (XEOMIN) 100 units SOLR injection [id number]  Inject 100 Units into the muscle every 3 (three) months.  [provider]  Active Self           Med Note [place], Lesleigh Hughson A   Wed Jul 24, 2023 12:59 PM) [facility] from [facility] Specialist  Incontinence Supply Disposable (PREVAIL BLADDER CONTROL PAD) MISC [id number]  Large incontinence pads to go into underwear [provider]  Active   insulin  glargine (LANTUS ) 100 UNIT/ML injection [id number] Yes Inject 46 Units into  the skin. [provider]  Active   Insulin  Pen Needle (GLOBAL EASE INJECT PEN NEEDLES) 31G X 5 MM MISC [id number]  Use pen needle to inject insulin  daily. Edman Marsa PARAS, DO  Active   lamoTRIgine  (LAMICTAL ) 25 MG tablet [id number]  Take 1 tablet (25 mg total) by mouth daily. Eappen, Saramma, MD  Active   Lancets (FREESTYLE) lancets [id number]  Test daily before all meals/snacks [provider]  Active Self    Discontinued 02/24/24 1524 (Dose change)   lidocaine  (LIDODERM ) 5 % [id number]  Place 2 patches onto the skin daily. [provider]  Active   linaclotide LARUE) 72 MCG capsule [id number]  Take 72 mcg by mouth daily before breakfast. [provider]  Active   lisinopril  (ZESTRIL ) 20 MG tablet [id number]  Take 1 tablet (20 mg total) by mouth daily. Karamalegos, Marsa PARAS, DO  Active   metFORMIN  (GLUCOPHAGE -XR) 500 MG 24 hr tablet [id number] Yes Take 2 tablets (1,000 mg  total) by mouth 2 (two) times daily. Edman Marsa PARAS, DO  Active   mirabegron  ER (MYRBETRIQ ) 50 MG TB24 tablet [id number]  Take 1 tablet (50 mg total) by mouth daily. Guadlupe Dull T, MD  Active   mirtazapine  (REMERON ) 15 MG tablet [id number]  Take 1.5 tablets (22.5 mg total) by mouth at bedtime. Eappen, Saramma, MD  Active   montelukast  (SINGULAIR ) 10 MG tablet [id number]  Take 1 tablet (10 mg total) by mouth at bedtime. Karamalegos, Marsa PARAS, DO  Active   naloxone [facility]) nasal spray 4 mg/0.1 mL [id number]   [provider]  Active   nystatin  powder [id number]  Apply 1 Application topically 3 (three) times daily. Until rash resolved, repeat use for another flare if needed Edman Marsa PARAS, DO  Active   omeprazole  (PRILOSEC) 40 MG capsule [id number]  Take 1 capsule (40 mg total) by mouth in the morning and at bedtime. Karamalegos, Marsa PARAS, DO  Active   ondansetron  (ZOFRAN -ODT) 4 MG disintegrating tablet [id number]  Take 1 tablet (4 mg total) by mouth every 8 (eight) hours as needed for nausea or vomiting. Karamalegos, Marsa PARAS, DO  Active   oxyCODONE -acetaminophen  (PERCOCET) 10-325 MG tablet [id number]  Take 1 tablet by mouth 3 (three) times daily as needed. [provider]  Active   SUMAtriptan (IMITREX) 100 MG tablet [id number]  Take 100 mg by mouth as directed. As needed for migraines [provider]  Active   triamcinolone cream (KENALOG) 0.1 % [id number]  Apply 1 Application topically 3 (three) times daily as needed. [provider]  Active               Assessment/Plan:   Advise Davis to contact [place] Dietician to reschedule canceled appointment    Diabetes: - Currently uncontrolled - Reviewed long term cardiovascular and renal outcomes of uncontrolled blood sugar - Reviewed goal A1c, goal fasting, and goal 2 hour post prandial glucose - Encourage Davis to follow dietary recommendations from Dietitian. Have reviewed  dietary modifications including importance of having regular well-balanced meals and snacks throughout the day, while controlling carbohydrate portion sizes             Discuss balanced carbohydrate-protein snack ideas - Collaborate with CMA Nicky/PCP in office to determine if sample of Dexcom G7 sensor available Advise Davis that sample Dexcom G7 sensor is available; Davis reports will plan to pick up sample from the office tomorrow between 2-4 pm - Recommend to continue to use Dexcom G7 CGM to monitor  blood sugar/as feedback on dietary choices Davis to check glucose with fingerstick check when needed for symptoms and as back up to CGM. Davis to contact office if needed for readings outside of established parameters or symptoms   Follow Up Plan: Clinical Pharmacist will follow up with Davis by telephone on 06/22/2024 at 3:00 PM     Sharyle Sia, PharmD, [facility] [phone]

## 2024-02-25 ENCOUNTER — Ambulatory Visit: Attending: Physical Medicine and Rehabilitation | Admitting: Physical Therapy

## 2024-02-25 ENCOUNTER — Telehealth: Payer: Self-pay | Admitting: Physical Therapy

## 2024-02-25 NOTE — Telephone Encounter (Signed)
 Called pt to inquire about absence from PT. Pt reports that she is scheduled to meet with [facility] in late November and unfortunately this is the earliest availability. Once they take a molding of her foot, then it will take an additional 4 weeks to make brace. PT has determined that pt should be removed from schedule for rest of October and November and added in mid -December given that she will not have her brace until then. Pt is in agreement with this plan and she will call to reschedule.

## 2024-02-26 ENCOUNTER — Ambulatory Visit

## 2024-02-27 ENCOUNTER — Ambulatory Visit: Admitting: Physical Therapy

## 2024-03-02 ENCOUNTER — Ambulatory Visit (INDEPENDENT_AMBULATORY_CARE_PROVIDER_SITE_OTHER)

## 2024-03-02 DIAGNOSIS — G801 Spastic diplegic cerebral palsy: Secondary | ICD-10-CM | POA: Diagnosis not present

## 2024-03-02 DIAGNOSIS — Z23 Encounter for immunization: Secondary | ICD-10-CM | POA: Diagnosis not present

## 2024-03-03 ENCOUNTER — Ambulatory Visit: Admitting: Obstetrics & Gynecology

## 2024-03-04 ENCOUNTER — Encounter: Payer: Self-pay | Admitting: Dietician

## 2024-03-04 ENCOUNTER — Encounter: Admitting: Physical Therapy

## 2024-03-09 ENCOUNTER — Encounter: Admitting: Physical Therapy

## 2024-03-10 ENCOUNTER — Other Ambulatory Visit: Payer: Self-pay | Admitting: Psychiatry

## 2024-03-10 ENCOUNTER — Telehealth: Payer: Self-pay

## 2024-03-10 ENCOUNTER — Telehealth: Payer: Self-pay | Admitting: Obstetrics & Gynecology

## 2024-03-10 DIAGNOSIS — F411 Generalized anxiety disorder: Secondary | ICD-10-CM

## 2024-03-10 DIAGNOSIS — N950 Postmenopausal bleeding: Secondary | ICD-10-CM

## 2024-03-10 DIAGNOSIS — G4701 Insomnia due to medical condition: Secondary | ICD-10-CM

## 2024-03-10 DIAGNOSIS — R9389 Abnormal findings on diagnostic imaging of other specified body structures: Secondary | ICD-10-CM

## 2024-03-10 MED ORDER — MISOPROSTOL 200 MCG PO TABS: ORAL_TABLET | ORAL | 0 refills | Status: AC

## 2024-03-10 NOTE — Telephone Encounter (Signed)
 Noted

## 2024-03-10 NOTE — Telephone Encounter (Signed)
 Medication management - Call with patient 2 times, with pharmacist, Lorn at [facility] and with pharmacist, Tawni at [facility] as [facility] was trying to get a new Lamotrigine  order for pt. Informed this as last sent to [facility] on 01/13/24 and should have been delivered to patient.  Patient stated she was not sure if she had this at home or not so called [facility] who reported they had sent out 100 pills from the 01/13/24 order to patient's resident on 02/21/24 so patient should have this medication at home and not be due a refill.  Patient during second call stated she wanted to stay with [facility] and would try to locate this medication at home.  Patient to call back if medication is not found and to also let [facility] know this is where she wants to continue with refills.

## 2024-03-10 NOTE — Telephone Encounter (Signed)
 I returned her call regarding her recent ultrasound at [facility] done for PMB. It showed a 10 mm endometrium. She has an appt here next week. I will plan to do an EMBX.  I called in cytotec to be taken the night prior to the biopsy. She is abstinent.

## 2024-03-11 NOTE — Telephone Encounter (Signed)
 Called patient to inform of message no answer unable to leave a voicemail due to mailbox being full

## 2024-03-16 ENCOUNTER — Encounter: Admitting: Physical Therapy

## 2024-03-17 ENCOUNTER — Other Ambulatory Visit: Payer: Self-pay | Admitting: Obstetrics

## 2024-03-17 ENCOUNTER — Ambulatory Visit: Admitting: Obstetrics & Gynecology

## 2024-03-17 VITALS — BP 133/76 | HR 102 | Ht 62.0 in | Wt 205.0 lb

## 2024-03-17 DIAGNOSIS — N950 Postmenopausal bleeding: Secondary | ICD-10-CM

## 2024-03-17 DIAGNOSIS — R9389 Abnormal findings on diagnostic imaging of other specified body structures: Secondary | ICD-10-CM

## 2024-03-17 MED ORDER — MISOPROSTOL 200 MCG PO TABS: ORAL_TABLET | ORAL | 0 refills | Status: AC

## 2024-03-17 NOTE — Progress Notes (Signed)
    GYNECOLOGY PROGRESS NOTE  Subjective:    Patient ID: Traci Davis, female    DOB: [birth year], 53 y.o.   MRN: [id number]  HPI  Patient is a 53 y.o. G1P1001 (53 yo and 1 grand) here for [facility]. She went through menopause around age 65 and started having bleeding again about 2 months ago. She went to the ED at [facility]. A pelvic ultrasound showed a 10 mm endometrium. She took cytotec last night in preparation for the biopsy.  The following portions of the patient's history were reviewed and updated as appropriate: allergies, current medications, past family history, past medical history, past social history, past surgical history, and problem list.  Review of Systems Pertinent items are noted in HPI.  Pap was normal 2024. Her daughter was born via cesarean section.  Objective:   Blood pressure 133/76, pulse (!) 102, height 5' 2 (1.575 m), weight 205 lb (93 kg), last menstrual period 08/20/2014. Body mass index is 37.49 kg/m. Well nourished, well hydrated White female, no apparent distress She is conversing normally. She ambulates with a walker and a left leg brace due to CP. I consented her for the EMBX and she was able to get in the dorsal lithotomy position. I could get a Pederson speculum in but she was unable to open her legs enough for me to even grasp the cervix with a tenaculum, much less do the EMBX.   Assessment:   PMB Thickened endometrium  Plan:  She will need to have a d&c, h/s in the OR where visualization of her cervix will be better.  I will call in cytotec to be taken prior to the procedure.

## 2024-03-18 ENCOUNTER — Encounter: Admitting: Physical Therapy

## 2024-03-23 ENCOUNTER — Encounter: Admitting: Physical Therapy

## 2024-03-25 ENCOUNTER — Encounter: Admitting: Physical Therapy

## 2024-03-30 ENCOUNTER — Encounter: Admitting: Physical Therapy

## 2024-03-30 ENCOUNTER — Ambulatory Visit: Payer: Self-pay

## 2024-03-30 ENCOUNTER — Inpatient Hospital Stay: Admitting: Family Medicine

## 2024-03-30 ENCOUNTER — Telehealth: Admitting: Dietician

## 2024-03-30 NOTE — Telephone Encounter (Signed)
 FYI Only or Action Required?: FYI only for provider: ED advised.  Patient was last seen in primary care on 12/24/2023 by Edman Marsa PARAS, DO.  Called Nurse Triage reporting Headache.  Symptoms began after falling 3 days ago.  Interventions attempted: OTC medications: ibuprofen and Rest, hydration, or home remedies.  Symptoms are: unchanged.  Triage Disposition: Go to ED Now (or PCP Triage)  Patient/caregiver understands and will follow disposition?: Yes            Copied from CRM #[id number]. Topic: Clinical - Red Word Triage >> Mar 30, 2024  1:53 PM Traci Davis wrote: Red Word that prompted transfer to Nurse Triage: fell on friday and has whiplash, hips and right side sore and hit her head Reason for Disposition . Patient sounds very sick or weak to the triager  Answer Assessment - Initial Assessment Questions Patient states she fell Friday--2 days ago --patient states she jarred her head and she felt her head jerk back when she fell and described it as whiplash She fell on a concrete floor, lost her balance, and her walker flipped over and she fell onto the concrete States she has a history of falls due to cerebral palsy States she has bruises on hip & woke up with a bad headache, took ibuprofen and that hasn't helped, states this headache has been constant since the fall and nothing is helping She rates the headache 5 out of 10 but states it is constant and nothing helps it to go away Patient states that she had an appointment today but cancelled due to not being able to make it---this was a follow up from being in the hospital with pancreatitis--she states this is doing a little better  Patient is advised that with her symptoms it is recommended that she is evaluated at the Emergency Room. Patient states that she does have transportation to the ER at this time and she is also advised that if anything worsens she can call 911 She verbalized understanding  Protocols  used: [facility]

## 2024-04-02 ENCOUNTER — Encounter: Admitting: Physical Therapy

## 2024-04-06 ENCOUNTER — Other Ambulatory Visit: Payer: Self-pay

## 2024-04-06 ENCOUNTER — Inpatient Hospital Stay
Admission: RE | Admit: 2024-04-06 | Discharge: 2024-04-06 | Disposition: A | Source: Ambulatory Visit | Attending: Obstetrics

## 2024-04-06 ENCOUNTER — Ambulatory Visit: Admitting: Family Medicine

## 2024-04-06 ENCOUNTER — Encounter: Payer: Self-pay | Admitting: Family Medicine

## 2024-04-06 VITALS — BP 125/80 | HR 102 | Ht 62.0 in

## 2024-04-06 DIAGNOSIS — E782 Mixed hyperlipidemia: Secondary | ICD-10-CM | POA: Diagnosis not present

## 2024-04-06 DIAGNOSIS — Z794 Long term (current) use of insulin: Secondary | ICD-10-CM

## 2024-04-06 DIAGNOSIS — E1169 Type 2 diabetes mellitus with other specified complication: Secondary | ICD-10-CM

## 2024-04-06 DIAGNOSIS — K861 Other chronic pancreatitis: Secondary | ICD-10-CM | POA: Diagnosis not present

## 2024-04-06 DIAGNOSIS — R569 Unspecified convulsions: Secondary | ICD-10-CM

## 2024-04-06 DIAGNOSIS — F331 Major depressive disorder, recurrent, moderate: Secondary | ICD-10-CM | POA: Diagnosis not present

## 2024-04-06 DIAGNOSIS — I10 Essential (primary) hypertension: Secondary | ICD-10-CM

## 2024-04-06 DIAGNOSIS — E1165 Type 2 diabetes mellitus with hyperglycemia: Secondary | ICD-10-CM

## 2024-04-06 DIAGNOSIS — G801 Spastic diplegic cerebral palsy: Secondary | ICD-10-CM

## 2024-04-06 DIAGNOSIS — R739 Hyperglycemia, unspecified: Secondary | ICD-10-CM

## 2024-04-06 DIAGNOSIS — J4540 Moderate persistent asthma, uncomplicated: Secondary | ICD-10-CM | POA: Diagnosis not present

## 2024-04-06 DIAGNOSIS — E119 Type 2 diabetes mellitus without complications: Secondary | ICD-10-CM

## 2024-04-06 DIAGNOSIS — R9389 Abnormal findings on diagnostic imaging of other specified body structures: Secondary | ICD-10-CM

## 2024-04-06 DIAGNOSIS — K859 Acute pancreatitis without necrosis or infection, unspecified: Secondary | ICD-10-CM

## 2024-04-06 HISTORY — DX: Abnormal findings on diagnostic imaging of other specified body structures: R93.89

## 2024-04-06 HISTORY — DX: Postmenopausal bleeding: N95.0

## 2024-04-06 NOTE — Progress Notes (Signed)
 Subjective:    Patient ID: Traci Davis, female    DOB: February 22, 1971, 53 y.o.   MRN: [id number]  Traci Davis is a 53 y.o. female presenting on 04/06/2024 for Hospitalization Follow-up   HPI  Discussed the use of AI scribe software for clinical note transcription with the patient, who gave verbal consent to proceed.  HOSPITAL FOLLOW-UP VISIT  Hospital/Location: [facility] Date of Admission:  03/22/24 Date of Discharge: 03/25/24 Transitions of care telephone call: 03/30/24 completed by Delon Rea RN  Reason for Admission: Acute Pancreatitis  [facility] H&P and Discharge Summary have been reviewed - Patient presents today 12 days after recent hospitalization.   Abdominal pain and acute pancreatitis - Hospitalized from November 23 to March 25, 2024, for acute pancreatitis flare-up. - Symptoms included severe abdominal pain, nausea, and vomiting. - CT scan confirmed acute interstitial pancreatitis. - Received pain management, intravenous fluids, and antiemetics during hospitalization. - Actively avoids foods that may trigger pancreatitis.  Glycemic control and type 2 diabetes mellitus Complications with Hyperlipidemia, Obesity and Hyperglycemia - Type 2 diabetes mellitus with ongoing difficulty controlling sugar cravings. - Last A1c 9.9 - Followed by [facility] End[facility] - Considering transition to Omnipod 5 insulin  pump for improved glucose control.  Astham, moderate persistent - History of asthma, previously misdiagnosed as COPD. - Uses Symbicort for asthma management and has a significant supply available. - Considering adjustment of Symbicort dosage for better symptom control. - Inquired about using Brovana with nebulizer for respiratory symptoms.  Musculoskeletal pain following fall - Experienced a fall on March 27, 2024, at a restaurant. - Resulted in soreness of knee, hip, and back. - No head trauma, but experienced a whiplash-like effect. - ED Visit  12/2 - CT scan of neck and head showed degenerative changes without acute injury. - Headaches have improved since the fall.  Sinus congestion and pressure - Sinus pressure and sensation of pressure behind eardrums, likely related to sinus congestion. - Uses sinus medications and nasal sprays for symptom management.  Hypertension - Home blood pressure typically 125/80 mmHg on two antihypertensive medications. - Blood pressure slightly elevated at today's visit.   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 Followed by [facility] Her CP contributed to her fall injury  - Today reports overall has done well after discharge. Symptoms improving  - New medications on discharge: None - Changes to current meds on discharge: None  I have reviewed the discharge medication list, and have reconciled the current and discharge medications today.   Current Outpatient Medications:    acetaminophen  (TYLENOL ) 325 MG tablet, Take 2 tablets (650 mg total) by mouth every 6 (six) hours as needed., Disp: 60 tablet, Rfl: 0   albuterol  (PROVENTIL  HFA;VENTOLIN  HFA) 108 (90 BASE) MCG/ACT inhaler, Inhale 2 puffs into the lungs 4 (four) times daily as needed. For shortness of breath and/or wheezing, Disp: , Rfl:    amLODipine  (NORVASC ) 5 MG tablet, Take 1 tablet (5 mg total) by mouth daily., Disp: 90 tablet, Rfl: 3   atorvastatin  (LIPITOR) 20 MG tablet, Take 1 tablet (20 mg total) by mouth daily., Disp: 90 tablet, Rfl: 3   azelastine (ASTELIN) 0.1 % nasal spray, Place into both nostrils., Disp: , Rfl:    Blood Glucose Monitoring Suppl (GLUCOCOM BLOOD GLUCOSE MONITOR) DEVI, Test daily before all meals/snacks and once before bedtime., Disp: , Rfl:    buprenorphine (BUTRANS) 15 MCG/HR, 1 patch once a week., Disp: , Rfl:    busPIRone  (  BUSPAR ) 10 MG tablet, Take 1 tablet (10 mg total) by mouth 3 (three) times daily., Disp: 270 tablet, Rfl: 1   Continuous Glucose Sensor  (DEXCOM G7 SENSOR) MISC, Use 1 sensor every 10 days for continuous glucose monitoring, Disp: 9 each, Rfl: 3   CREON  36000-114000 units CPEP capsule, Take 3 capsules (108,000 Units total) by mouth 3 (three) times daily with meals., Disp: 300 capsule, Rfl: 5   dicyclomine  (BENTYL ) 10 MG capsule, Take 2 capsules in morning with meal and 1 capsule in evening with meal., Disp: 90 capsule, Rfl: 5   donepezil (ARICEPT) 10 MG tablet, Take 10 mg by mouth at bedtime., Disp: , Rfl:    estradiol (ESTRACE) 0.1 MG/GM vaginal cream, Place 1 Applicatorful vaginally 3 (three) times a week., Disp: , Rfl:    FLUoxetine  (PROZAC ) 20 MG capsule, Take 1 capsule (20 mg total) by mouth daily. Take along with 40 mg daily - total of 60 mg daily, Disp: 90 capsule, Rfl: 1   FLUoxetine  (PROZAC ) 40 MG capsule, Take 1 capsule (40 mg total) by mouth daily. Take along with 20 mg daily, Disp: 90 capsule, Rfl: 1   fluticasone  (FLONASE ) 50 MCG/ACT nasal spray, Place 2 sprays into both nostrils daily., Disp: 48 g, Rfl: 3   furosemide  (LASIX ) 40 MG tablet, Take 1 tablet (40 mg total) by mouth daily as needed for fluid., Disp: 30 tablet, Rfl: 5   gabapentin (NEURONTIN) 300 MG capsule, Take 600 mg by mouth 3 (three) times daily., Disp: , Rfl:    glucose blood test strip, USE TO TEST BLOOD SUGAR TWO TIMES A DAY, Disp: , Rfl:    GVOKE HYPOPEN  2-PACK 1 MG/0.2ML SOAJ, Inject 1 mg into the skin as needed (hypoglycemia)., Disp: 1 mL, Rfl: 0   HUMALOG KWIKPEN 100 UNIT/ML KwikPen, Inject 10 Units under the skin Three (3) times a day before meals., Disp: , Rfl:    incobotulinumtoxinA (XEOMIN) 100 units SOLR injection, Inject 100 Units into the muscle every 3 (three) months. , Disp: , Rfl:    Incontinence Supply Disposable (PREVAIL BLADDER CONTROL PAD) MISC, Large incontinence pads to go into underwear, Disp: , Rfl:    insulin  glargine (LANTUS ) 100 UNIT/ML injection, Inject 46 Units into the skin., Disp: , Rfl:    Insulin  Pen Needle (GLOBAL EASE  INJECT PEN NEEDLES) 31G X 5 MM MISC, Use pen needle to inject insulin  daily., Disp: 100 each, Rfl: 0   lamoTRIgine  (LAMICTAL ) 25 MG tablet, Take 1 tablet (25 mg total) by mouth daily., Disp: 90 tablet, Rfl: 1   Lancets (FREESTYLE) lancets, Test daily before all meals/snacks, Disp: , Rfl:    lidocaine  (LIDODERM ) 5 %, Place 2 patches onto the skin daily., Disp: , Rfl:    linaclotide (LINZESS) 72 MCG capsule, Take 72 mcg by mouth daily before breakfast., Disp: , Rfl:    lisinopril  (ZESTRIL ) 20 MG tablet, Take 1 tablet (20 mg total) by mouth daily., Disp: 90 tablet, Rfl: 3   metFORMIN  (GLUCOPHAGE -XR) 500 MG 24 hr tablet, Take 2 tablets (1,000 mg total) by mouth 2 (two) times daily., Disp: 360 tablet, Rfl: 3   mirabegron  ER (MYRBETRIQ ) 50 MG TB24 tablet, Take 1 tablet (50 mg total) by mouth daily., Disp: 30 tablet, Rfl: 3   mirtazapine  (REMERON ) 15 MG tablet, TAKE 1 and 1/2 TABLETS BY MOUTH AT BEDTIME, Disp: 135 tablet, Rfl: 1   misoprostol  (CYTOTEC ) 200 MCG tablet, Take 3 pills by mouth the night before biopsy., Disp: 3 tablet, Rfl: 0  misoprostol  (CYTOTEC ) 200 MCG tablet, Take 3 pills by mouth the night before dilation and curettage., Disp: 3 tablet, Rfl: 0   montelukast  (SINGULAIR ) 10 MG tablet, Take 1 tablet (10 mg total) by mouth at bedtime., Disp: 90 tablet, Rfl: 3   naloxone (NARCAN) nasal spray 4 mg/0.1 mL, , Disp: , Rfl:    nystatin  powder, Apply 1 Application topically 3 (three) times daily. Until rash resolved, repeat use for another flare if needed, Disp: 30 g, Rfl: 3   omeprazole  (PRILOSEC) 40 MG capsule, Take 1 capsule (40 mg total) by mouth in the morning and at bedtime., Disp: 90 capsule, Rfl: 3   ondansetron  (ZOFRAN -ODT) 4 MG disintegrating tablet, Take 1 tablet (4 mg total) by mouth every 8 (eight) hours as needed for nausea or vomiting., Disp: 30 tablet, Rfl: 2   oxyCODONE -acetaminophen  (PERCOCET) 10-325 MG tablet, Take 1 tablet by mouth 3 (three) times daily as needed., Disp: , Rfl:     SUMAtriptan (IMITREX) 100 MG tablet, Take 100 mg by mouth as directed. As needed for migraines, Disp: , Rfl:    SYMBICORT 160-4.5 MCG/ACT inhaler, Inhale 2 puffs into the lungs daily., Disp: , Rfl:    triamcinolone cream (KENALOG) 0.1 %, Apply 1 Application topically 3 (three) times daily as needed., Disp: , Rfl:   ------------------------------------------------------------------------- Social History   Tobacco Use   Smoking status: Never   Smokeless tobacco: Never  Vaping Use   Vaping status: Never Used  Substance Use Topics   Alcohol use: No   Drug use: No    Review of Systems Per HPI unless specifically indicated above     Objective:    BP 125/80 (BP Location: Left Arm, Cuff Size: Normal)   Pulse (!) 102   Ht 5' 2 (1.575 m)   LMP 08/20/2014 Comment: missed cup when trying to obtain urine spec  SpO2 95%   BMI 37.49 kg/m   Wt Readings from Last 3 Encounters:  03/17/24 205 lb (93 kg)  01/15/24 200 lb (90.7 kg)  12/24/23 210 lb 2 oz (95.3 kg)    Physical Exam Vitals and nursing note reviewed.  Constitutional:      General: She is not in acute distress.    Appearance: She is well-developed. She is obese. She is not diaphoretic.     Comments: Well-appearing, comfortable, cooperative  HENT:     Head: Normocephalic and atraumatic.     Right Ear: Ear canal and external ear normal. There is no impacted cerumen.     Left Ear: Ear canal and external ear normal. There is no impacted cerumen.     Ears:     Comments: Left TM with increased pressure without effusion or erythema Eyes:     General:        Right eye: No discharge.        Left eye: No discharge.     Conjunctiva/sclera: Conjunctivae normal.  Neck:     Thyroid : No thyromegaly.  Cardiovascular:     Rate and Rhythm: Normal rate and regular rhythm.     Heart sounds: Normal heart sounds. No murmur heard. Pulmonary:     Effort: Pulmonary effort is normal. No respiratory distress.     Breath sounds: Normal  breath sounds. No wheezing or rales.  Musculoskeletal:        General: Normal range of motion.     Cervical back: Normal range of motion and neck supple.     Comments: Left lower extremity with brace and has  walker  Lymphadenopathy:     Cervical: No cervical adenopathy.  Skin:    General: Skin is warm and dry.     Findings: No erythema or rash.  Neurological:     Mental Status: She is alert and oriented to person, place, and time.  Psychiatric:        Behavior: Behavior normal.     Comments: Well groomed, good eye contact, normal speech and thoughts     I have personally reviewed the radiology report from 03/22/24 CT Abd Pelvis  CT Abdomen Pelvis W IV Contrast  Anatomical Region Laterality Modality  Abdomen -- Computed Tomography  Pelvis -- --   Impression  Acute interstitial pancreatitis. No organized fluid collection. Narrative  EXAM: CT ABDOMEN PELVIS W CONTRAST ACCESSION: [id number] [facility] REPORT DATE: 03/22/2024 11:00 PM CLINICAL INDICATION: 53 years old with Acute left upper quadrant pain and leukocytosis    COMPARISON: CT abdomen and pelvis 05/22/2023  TECHNIQUE: A helical CT scan of the abdomen and pelvis was obtained following IV contrast from the lung bases through the pubic symphysis. Images were reconstructed in the axial plane. Coronal and sagittal reformatted images were also provided for further evaluation.   FINDINGS:  LOWER CHEST: Unremarkable.  LIVER: Normal liver contour.  No focal liver lesions.  BILIARY: The gallbladder is surgically absent. Mild intrahepatic and extrahepatic biliary ductal dilatation, similar to prior  SPLEEN: Normal in size and contour.  PANCREAS: Diffuse fatty atrophy of the pancreas. There is subtle stranding around the pancreatic body (2:38). No ductal dilation.  ADRENAL GLANDS: Normal appearance of the adrenal glands.  KIDNEYS/URETERS: Symmetric renal enhancement.  No hydronephrosis.  No solid renal mass. Similar left  renal lower pole cyst measuring 1.6 cm.  BLADDER: Unremarkable.  REPRODUCTIVE ORGANS: Unremarkable.  GI TRACT: No findings of bowel obstruction or acute inflammation.  Normal appendix.  PERITONEUM, RETROPERITONEUM AND MESENTERY: No free air.  No ascites.  No fluid collection.  LYMPH NODES: No adenopathy.  VESSELS: Hepatic and portal veins are patent.  Normal caliber aorta.    BONES and SOFT TISSUES: Trace retrolisthesis at L3-4 and L4-5. Mild lower lumbar facet arthropathy. No aggressive osseous lesions.  No focal soft tissue lesions. Similar subfascial lipoma within the rectus abdominis sheath. Fatty atrophy of the visualized proximal left rectus femoris muscle, possibly sequelae of old injury. Procedure Note  Geroge Clause, MD - 03/22/2024 Formatting of this note might be different from the original. EXAM: CT ABDOMEN PELVIS W CONTRAST ACCESSION: [id number] [facility] REPORT DATE: 03/22/2024 11:00 PM CLINICAL INDICATION: 53 years old with Acute left upper quadrant pain and leukocytosis    COMPARISON: CT abdomen and pelvis 05/22/2023  TECHNIQUE: A helical CT scan of the abdomen and pelvis was obtained following IV contrast from the lung bases through the pubic symphysis. Images were reconstructed in the axial plane. Coronal and sagittal reformatted images were also provided for further evaluation.   FINDINGS:  LOWER CHEST: Unremarkable.  LIVER: Normal liver contour.  No focal liver lesions.  BILIARY: The gallbladder is surgically absent. Mild intrahepatic and extrahepatic biliary ductal dilatation, similar to prior  SPLEEN: Normal in size and contour.  PANCREAS: Diffuse fatty atrophy of the pancreas. There is subtle stranding around the pancreatic body (2:38). No ductal dilation.  ADRENAL GLANDS: Normal appearance of the adrenal glands.  KIDNEYS/URETERS: Symmetric renal enhancement.  No hydronephrosis.  No solid renal mass. Similar left renal lower pole cyst measuring 1.6  cm.  BLADDER: Unremarkable.  REPRODUCTIVE ORGANS: Unremarkable.  GI TRACT: No findings of  bowel obstruction or acute inflammation.  Normal appendix.  PERITONEUM, RETROPERITONEUM AND MESENTERY: No free air.  No ascites.  No fluid collection.  LYMPH NODES: No adenopathy.  VESSELS: Hepatic and portal veins are patent.  Normal caliber aorta.    BONES and SOFT TISSUES: Trace retrolisthesis at L3-4 and L4-5. Mild lower lumbar facet arthropathy. No aggressive osseous lesions.  No focal soft tissue lesions. Similar subfascial lipoma within the rectus abdominis sheath. Fatty atrophy of the visualized proximal left rectus femoris muscle, possibly sequelae of old injury.   IMPRESSION: Acute interstitial pancreatitis. No organized fluid collection. Exam End: 03/22/24 22:41   Specimen Collected: 03/22/24 23:00 Last Resulted: 03/22/24 23:31  Received From: [facility]  Result Received: 04/06/24 07:49    ------------------  I have personally reviewed the radiology report from 03/31/24 on CT Cervical Spine.   CT Cervical Spine Wo Contrast  Anatomical Region Laterality Modality  Spine -- Computed Tomography  C-spine -- --   Impression   No evidence of acute intracranial pathology. 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  Multilevel degenerative changes of the cervical spine without evidence of acute fracture or traumatic listhesis. Narrative  This result has an attachment that is not available. EXAM: CT HEAD WO CONTRAST, CT CERVICAL SPINE WO CONTRAST DATE: 03/31/2024 5:09 PM ACCESSION: [id number] [facility], [id number] [facility] DICTATED: 03/31/2024 5:11 PM INTERPRETATION LOCATION: MAIN CAMPUS  CLINICAL INDICATION: 53 years old Female with Fall, possible head strike  -  headaches and neck pain    COMPARISON: CT head from 07/04/2019.  TECHNIQUE: Volumetric CT acquisition was performed through the head and cervical spine without the use of intravenous contrast. Post processed coronal and  sagittal reformatted images were obtained.  FINDINGS:  CT HEAD: The gray-white matter differentiation is intact. There is no evidence of acute infarct, hemorrhage, mass or mass effect. 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 The extra-axial spaces appear unremarkable.The visualized paranasal sinuses are clear. The mastoid air cells are aerated. No acute calvarial fracture is identified. Hyperostosis frontalis.  CT CERVICAL SPINE: There is normal mineralization. 2 to 3 mm of anterolisthesis of C3-4 on C5, likely degenerative in light of diffuse facet degenerative changes. No acute fracture is identified.The vertebral body heights are preserved.mild multilevel disc space narrowing. The facet joints appear intact.The prevertebral soft tissues appear unremarkable except for mildly prominent thyroid  with mild heterogeneity. Procedure Note  Charise Berwyn Helling, DO - 03/31/2024 Formatting of this note might be different from the original. EXAM: CT HEAD WO CONTRAST, CT CERVICAL SPINE WO CONTRAST DATE: 03/31/2024 5:09 PM ACCESSION: [id number] [facility], [id number] [facility] DICTATED: 03/31/2024 5:11 PM INTERPRETATION LOCATION: MAIN CAMPUS  CLINICAL INDICATION: 53 years old Female with Fall, possible head strike  -  headaches and neck pain    COMPARISON: CT head from 07/04/2019.  TECHNIQUE: Volumetric CT acquisition was performed through the head and cervical spine without the use of intravenous contrast. Post processed coronal and sagittal reformatted images were obtained.  FINDINGS:  CT HEAD: The gray-white matter differentiation is intact. There is no evidence of acute infarct, hemorrhage, mass or mass effect. 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 The extra-axial spaces appear unremarkable.The visualized paranasal sinuses are  clear. The mastoid air cells are aerated. No acute calvarial fracture is identified. Hyperostosis frontalis.  CT CERVICAL SPINE: There is normal mineralization. 2 to 3 mm  of anterolisthesis of C3-4 on C5, likely degenerative in light of diffuse facet degenerative changes. No acute fracture is identified.The vertebral body heights are preserved.mild multilevel disc space narrowing. The facet joints appear intact.The prevertebral soft tissues appear unremarkable except for mildly prominent thyroid  with mild heterogeneity.  IMPRESSION:  No evidence of acute intracranial pathology. 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  Multilevel degenerative changes of the cervical spine without evidence of acute fracture or traumatic listhesis. Exam End: 03/31/24 17:09   Specimen Collected: 03/31/24 17:11 Last Resulted: 03/31/24 17:31  Received From: [facility]  Result Received: 04/01/24 09:51   ---------------------  CT head without contrast  Anatomical Region Laterality Modality  Head -- Computed Tomography   Impression   No evidence of acute intracranial pathology. 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  Multilevel degenerative changes of the cervical spine without evidence of acute fracture or traumatic listhesis. Narrative  This result has an attachment that is not available. EXAM: CT HEAD WO CONTRAST, CT CERVICAL SPINE WO CONTRAST DATE: 03/31/2024 5:09 PM ACCESSION: [id number] [facility], [id number] [facility] DICTATED: 03/31/2024 5:11 PM INTERPRETATION LOCATION: MAIN CAMPUS  CLINICAL INDICATION: 53 years old Female with Fall, possible head strike  -  headaches and neck pain    COMPARISON: CT head from 07/04/2019.  TECHNIQUE: Volumetric CT acquisition was performed through the head and cervical spine without the use of intravenous contrast. Post processed coronal and sagittal reformatted images were obtained.  FINDINGS:  CT HEAD: The gray-white matter differentiation is intact. There  is no evidence of acute infarct, hemorrhage, mass or mass effect. 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 The extra-axial spaces appear unremarkable.The visualized paranasal sinuses are clear. The mastoid air cells are aerated. No acute calvarial fracture is identified. Hyperostosis frontalis.  CT CERVICAL SPINE: There is normal mineralization. 2 to 3 mm of anterolisthesis of C3-4 on C5, likely degenerative in light of diffuse facet degenerative changes. No acute fracture is identified.The vertebral body heights are preserved.mild multilevel disc space narrowing. The facet joints appear intact.The prevertebral soft tissues appear unremarkable except for mildly prominent thyroid  with mild heterogeneity. Procedure Note  Charise Berwyn Helling, DO - 03/31/2024 Formatting of this note might be different from the original. EXAM: CT HEAD WO CONTRAST, CT CERVICAL SPINE WO CONTRAST DATE: 03/31/2024 5:09 PM ACCESSION: [id number] [facility], [id number] [facility] DICTATED: 03/31/2024 5:11 PM INTERPRETATION LOCATION: MAIN CAMPUS  CLINICAL INDICATION: 53 years old Female with Fall, possible head strike  -  headaches and neck pain    COMPARISON: CT head from 07/04/2019.  TECHNIQUE: Volumetric CT acquisition was performed through the head and cervical spine without the use of intravenous contrast. Post processed coronal and sagittal reformatted images were obtained.  FINDINGS:  CT HEAD: The gray-white matter differentiation is intact. There is no evidence of acute infarct, hemorrhage, mass or mass effect. 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 The extra-axial spaces appear unremarkable.The visualized paranasal sinuses are clear. The mastoid air cells are aerated. No acute calvarial fracture is identified. Hyperostosis frontalis.  CT  CERVICAL SPINE: There is normal mineralization. 2 to 3 mm of anterolisthesis of C3-4 on C5, likely degenerative in light of diffuse facet degenerative changes. No acute fracture is identified.The vertebral body heights are preserved.mild multilevel disc space narrowing. The facet joints appear intact.The prevertebral soft tissues appear unremarkable except for mildly prominent  thyroid  with mild heterogeneity.  IMPRESSION:  No evidence of acute intracranial pathology. 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  Multilevel degenerative changes of the cervical spine without evidence of acute fracture or traumatic listhesis. Exam End: 03/31/24 17:09   Specimen Collected: 03/31/24 17:11 Last Resulted: 03/31/24 17:31  Received From: [facility]  Result Received: 04/01/24 09:51    Results for orders placed or performed in visit on 10/01/23  HM DIABETES EYE EXAM   Collection Time: 09/24/23 11:08 AM  Result Value Ref Range   HM Diabetic Eye Exam No Retinopathy No Retinopathy      Assessment & Plan:   Problem List Items Addressed This Visit     Convulsions (HCC)   MDD (major depressive disorder), recurrent episode, moderate (HCC)   Obesity, morbid (HCC)   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(HCC)   Type 2 diabetes mellitus with other specified complication (HCC)   Other Visit Diagnoses       Acute on chronic pancreatitis (HCC)    -  Primary     Long term current use of insulin  (HCC)         Moderate persistent asthma without complication       Relevant Medications   SYMBICORT 160-4.5 MCG/ACT inhaler     Mixed hyperlipidemia         Hyperglycemia            Acute on chronic pancreatitis Recent hospitalization for acute interstitial pancreatitis confirmed by CT. Managed with fluids, pain, and nausea control. Recurrent episodes possibly linked to dietary triggers.  HFU symptoms resolved now after flare. Goal for improved DM control Per Endocrine, she is considering Omnipod 5  considered for diabetes management, may aid sugar control but not pancreatitis prevention. - Continue dietary modifications with smaller, frequent meals. - Continue pancreatic enzyme supplementation. - Discuss Omnipod 5 with endocrinologist regarding pancreatitis impact.  Type 2 diabetes mellitus with long-term insulin  use Complications with Hyperlipidemia, Hyperglycemia and Chronic Pancreatitis This problem is being managed by Endocrinology Omnipod 5 considered for improved glycemic control. Discussed benefits for managing blood glucose and preventing hypoglycemia. Not expected to prevent pancreatitis. - Discuss Omnipod 5 with endocrinologist for further guidance. - Consider Omnipod 5 for improved diabetes management.  Moderate persistent asthma Managed with Symbicort. Recent wheezing suggests inadequate control. Brovana not covered. Plan to increase Symbicort dosage. - Increase Symbicort to 160 mcg, two puffs once daily. - Use existing Symbicort inhalers, four puffs once daily until new inhaler obtained. - Monitor response to increased dosage and adjust as needed.  Eustachian tube dysfunction with sinus congestion Sinus pressure and slight ear pressure noted. No infection or fluid. Symptoms due to sinus congestion. - Use nasal sprays such as Flonase  for sinus congestion. - Consider decongestant medications as needed.      Seizure Disorder Cerebral palsy Mild Cognitive Impairment Followed by [facility] for complicated chronic neurological problems. On medication management Lamictal , Gabapentin, Aricept    No orders of the defined types were placed in this encounter.   Follow up plan: Return if symptoms worsen or fail to improve.   Marsa Officer, DO [facility] 04/06/2024, 2:03 PM

## 2024-04-06 NOTE — Patient Instructions (Addendum)
 Thank you for coming to the office today.  Double dose Symbicort old inhaler 4 puffs once per day, and when we order the new inhaler double check the number on it, 160 mg = 2 puffs per day.  No fluid, but focus on sinus de congestion. Nasal spray or medication  Lungs clear today  I agree with the Omnipod 5 but ask your Endocrine about pancreatitis   Please schedule a Follow-up Appointment to: Return if symptoms worsen or fail to improve.  If you have any other questions or concerns, please feel free to call the office or send a message through MyChart. You may also schedule an earlier appointment if necessary.  Additionally, you may be receiving a survey about your experience at our office within a few days to 1 week by e-mail or mail. We value your feedback.  Marsa Officer, DO [facility], [place]

## 2024-04-06 NOTE — Patient Instructions (Addendum)
 Your procedure is scheduled on: 04/13/24 - Monday Report to the Registration Desk on the 1st floor of the [facility]. To find out your arrival time, please call [phone] between 1PM - 3PM on: 04/10/24 - Friday  If your arrival time is 6:00 am, do not arrive before that time as the [facility] entrance doors do not open until 6:00 am. [facility] - [address] [place] at 3:30 pm, [address] - Pre Admission testing.  REMEMBER: Instructions that are not followed completely may result in serious medical risk, up to and including death; or upon the discretion of your surgeon and anesthesiologist your surgery may need to be rescheduled.  Do not eat food or drink any liquids after midnight the night before surgery.  No gum chewing or hard candies.  One week prior to surgery: Stop Anti-inflammatories (NSAIDS) such as Advil, Aleve, Ibuprofen, Motrin, Naproxen, Naprosyn and Aspirin based products such as Excedrin, Goody's Powder, BC Powder.  You may take Tylenol  and /or oxyCODONE -acetaminophen  if needed for pain up until the day of surgery.  Stop ANY OVER THE COUNTER supplements until after surgery : Vitamin D  HUMALOG KWIKPEN hold on the morning of surgery, may resume with meals.  insulin  glargine (LANTUS )  take 1/2 of your prescribed dose at bedtime on the night before your surgery.  metFORMIN  (GLUCOPHAGE  ) hold beginning 12/13 , resume after surgery.  ON THE DAY OF SURGERY ONLY TAKE THESE MEDICATIONS WITH SIPS OF WATER:  amLODipine  (NORVASC )  busPIRone  (BUSPAR )  3.  gabapentin (NEURONTIN)  4.  lamoTRIgine  (LAMICTAL )  5.   linaclotide (LINZESS)  6.  mirabegron  ER  7.   omeprazole  (PRILOSEC)  8.  FLUoxetine  (PROZAC )   Use inhalers SYMBICORT  on the day of surgery and bring to the hospital.   No Alcohol for 24 hours before or after surgery.  No Smoking including e-cigarettes for 24 hours before surgery.  No chewable tobacco  products for at least 6 hours before surgery.  No nicotine patches on the day of surgery.  Do not use any recreational drugs for at least a week (preferably 2 weeks) before your surgery.  Please be advised that the combination of cocaine and anesthesia may have negative outcomes, up to and including death. If you test positive for cocaine, your surgery will be cancelled.  On the morning of surgery brush your teeth with toothpaste and water, you may rinse your mouth with mouthwash if you wish. Do not swallow any toothpaste or mouthwash.  Use CHG Soap or wipes as directed on instruction sheet.  Do not wear jewelry, make-up, hairpins, clips or nail polish.  For welded (permanent) jewelry: bracelets, anklets, waist bands, etc.  Please have this removed prior to surgery.  If it is not removed, there is a chance that hospital personnel will need to cut it off on the day of surgery.  Do not wear lotions, powders, or perfumes.   Do not shave body hair from the neck down 48 hours before surgery.  Contact lenses, hearing aids and dentures may not be worn into surgery.  Do not bring valuables to the hospital. [facility] is not responsible for any missing/lost belongings or valuables.   Notify your doctor if there is any change in your medical condition (cold, fever, infection).  Wear comfortable clothing (specific to your surgery type) to the hospital.  After surgery, you can help prevent lung complications by doing breathing exercises.  Take deep breaths and cough every 1-2 hours. Your doctor may order a device called an Incentive Spirometer to help you take deep breaths.  If you are being admitted to the hospital overnight, leave your suitcase in the car. After surgery it may be brought to your room.  In case of increased patient census, it may be necessary for you, the patient, to continue your postoperative care in the Same Day Surgery department.  If you are being discharged the day  of surgery, you will not be allowed to drive home. You will need a responsible individual to drive you home and stay with you for 24 hours after surgery.   If you are taking public transportation, you will need to have a responsible individual with you.  Please call the Pre-admissions Testing Dept. at [phone] if you have any questions about these instructions.  Surgery Visitation Policy:  Patients having surgery or a procedure may have two visitors.  Children under the age of 21 must have an adult with them who is not the patient.  Inpatient Visitation:    Visiting hours are 7 a.m. to 8 p.m. Up to four visitors are allowed at one time in a patient room. The visitors may rotate out with other people during the day.  One visitor age 59 or older may stay with the patient overnight and must be in the room by 8 p.m.   Merchandiser, Retail to address health-related social needs:  [email or web address]

## 2024-04-07 ENCOUNTER — Encounter: Admitting: Physical Therapy

## 2024-04-07 NOTE — Progress Notes (Signed)
 Traci Davis                                          MRN: [id number]   04/07/2024   The [facility] Quality Team Specialist reviewed this patient medical record for the purposes of chart review for care gap closure. The following were reviewed: chart review for care gap closure-glycemic status assessment.    [facility] Quality Team

## 2024-04-08 ENCOUNTER — Telehealth: Payer: Self-pay

## 2024-04-08 ENCOUNTER — Inpatient Hospital Stay: Admission: RE | Admit: 2024-04-08 | Discharge: 2024-04-08 | Attending: Obstetrics

## 2024-04-08 DIAGNOSIS — I10 Essential (primary) hypertension: Secondary | ICD-10-CM | POA: Insufficient documentation

## 2024-04-08 NOTE — Telephone Encounter (Signed)
 Pt called triage stating she has surgery tomorrow with Dr Leigh. She wanted to tell Dr Leigh if she seen anything bad  while doing the [facility] to do a hysterectomy. This would have to be a completely different surgery correct? Please advise

## 2024-04-09 ENCOUNTER — Encounter: Admitting: Physical Therapy

## 2024-04-13 ENCOUNTER — Other Ambulatory Visit: Payer: Self-pay

## 2024-04-13 ENCOUNTER — Ambulatory Visit: Admitting: Anesthesiology

## 2024-04-13 ENCOUNTER — Encounter: Admission: RE | Disposition: A | Payer: Self-pay | Attending: Obstetrics

## 2024-04-13 ENCOUNTER — Ambulatory Visit
Admission: RE | Admit: 2024-04-13 | Discharge: 2024-04-13 | Disposition: A | Attending: Obstetrics | Admitting: Obstetrics

## 2024-04-13 ENCOUNTER — Encounter: Payer: Self-pay | Admitting: Obstetrics

## 2024-04-13 ENCOUNTER — Encounter: Admitting: Urgent Care

## 2024-04-13 DIAGNOSIS — N950 Postmenopausal bleeding: Secondary | ICD-10-CM | POA: Diagnosis present

## 2024-04-13 DIAGNOSIS — N840 Polyp of corpus uteri: Secondary | ICD-10-CM | POA: Insufficient documentation

## 2024-04-13 DIAGNOSIS — J449 Chronic obstructive pulmonary disease, unspecified: Secondary | ICD-10-CM | POA: Diagnosis not present

## 2024-04-13 DIAGNOSIS — E66813 Obesity, class 3: Secondary | ICD-10-CM | POA: Insufficient documentation

## 2024-04-13 DIAGNOSIS — K219 Gastro-esophageal reflux disease without esophagitis: Secondary | ICD-10-CM | POA: Diagnosis not present

## 2024-04-13 DIAGNOSIS — E119 Type 2 diabetes mellitus without complications: Secondary | ICD-10-CM | POA: Insufficient documentation

## 2024-04-13 DIAGNOSIS — Z7984 Long term (current) use of oral hypoglycemic drugs: Secondary | ICD-10-CM | POA: Insufficient documentation

## 2024-04-13 DIAGNOSIS — F419 Anxiety disorder, unspecified: Secondary | ICD-10-CM | POA: Insufficient documentation

## 2024-04-13 DIAGNOSIS — R9389 Abnormal findings on diagnostic imaging of other specified body structures: Secondary | ICD-10-CM | POA: Diagnosis present

## 2024-04-13 DIAGNOSIS — R519 Headache, unspecified: Secondary | ICD-10-CM | POA: Diagnosis not present

## 2024-04-13 HISTORY — PX: HYSTEROSCOPY WITH D & C: SHX1775

## 2024-04-13 LAB — GLUCOSE, CAPILLARY
Glucose-Capillary: 176 mg/dL — ABNORMAL HIGH (ref 70–99)
Glucose-Capillary: 162 mg/dL — ABNORMAL HIGH (ref 70–99)

## 2024-04-13 SURGERY — DILATATION AND CURETTAGE /HYSTEROSCOPY
Anesthesia: General | Site: Uterus

## 2024-04-13 MED ORDER — CHLORHEXIDINE GLUCONATE 0.12 % MT SOLN
15.0000 mL | Freq: Once | OROMUCOSAL | Status: AC
  Administered 2024-04-13: 08:00:00 15 mL via OROMUCOSAL

## 2024-04-13 MED ORDER — FENTANYL CITRATE (PF) 100 MCG/2ML IJ SOLN
INTRAMUSCULAR | Status: AC
  Filled 2024-04-13: qty 2

## 2024-04-13 MED ORDER — OXYCODONE HCL 5 MG PO TABS
5.0000 mg | ORAL_TABLET | Freq: Once | ORAL | Status: AC | PRN
  Administered 2024-04-13: 11:00:00 5 mg via ORAL

## 2024-04-13 MED ORDER — FENTANYL CITRATE (PF) 100 MCG/2ML IJ SOLN
INTRAMUSCULAR | Status: DC | PRN
  Administered 2024-04-13: 09:00:00 50 ug via INTRAVENOUS

## 2024-04-13 MED ORDER — OXYCODONE HCL 5 MG PO TABS
ORAL_TABLET | ORAL | Status: AC
  Filled 2024-04-13: qty 1

## 2024-04-13 MED ORDER — FENTANYL CITRATE (PF) 100 MCG/2ML IJ SOLN
25.0000 ug | INTRAMUSCULAR | Status: DC | PRN
  Administered 2024-04-13: 11:00:00 25 ug via INTRAVENOUS

## 2024-04-13 MED ORDER — ROCURONIUM BROMIDE 100 MG/10ML IV SOLN
INTRAVENOUS | Status: DC | PRN
  Administered 2024-04-13: 09:00:00 30 mg via INTRAVENOUS

## 2024-04-13 MED ORDER — PROPOFOL 10 MG/ML IV BOLUS
INTRAVENOUS | Status: AC
  Filled 2024-04-13: qty 20

## 2024-04-13 MED ORDER — 0.9 % SODIUM CHLORIDE (POUR BTL) OPTIME
TOPICAL | Status: DC | PRN
  Administered 2024-04-13: 09:00:00 500 mL

## 2024-04-13 MED ORDER — LIDOCAINE HCL (CARDIAC) PF 100 MG/5ML IV SOSY
PREFILLED_SYRINGE | INTRAVENOUS | Status: DC | PRN
  Administered 2024-04-13: 09:00:00 100 mg via INTRAVENOUS

## 2024-04-13 MED ORDER — DEXMEDETOMIDINE HCL IN NACL 200 MCG/50ML IV SOLN
INTRAVENOUS | Status: DC | PRN
  Administered 2024-04-13: 10:00:00 4 ug via INTRAVENOUS

## 2024-04-13 MED ORDER — SEVOFLURANE IN SOLN
RESPIRATORY_TRACT | Status: AC
  Filled 2024-04-13: qty 250

## 2024-04-13 MED ORDER — OXYCODONE HCL 5 MG/5ML PO SOLN: 5.0000 mg | Freq: Once | ORAL | Status: AC | PRN

## 2024-04-13 MED ORDER — SILVER NITRATE-POT NITRATE 75-25 % EX MISC
CUTANEOUS | Status: DC | PRN
  Administered 2024-04-13: 10:00:00 2

## 2024-04-13 MED ORDER — SUGAMMADEX SODIUM 200 MG/2ML IV SOLN
INTRAVENOUS | Status: DC | PRN
  Administered 2024-04-13: 10:00:00 100 mg via INTRAVENOUS

## 2024-04-13 MED ORDER — DEXAMETHASONE SOD PHOSPHATE PF 10 MG/ML IJ SOLN
INTRAMUSCULAR | Status: DC | PRN
  Administered 2024-04-13: 09:00:00 5 mg via INTRAVENOUS

## 2024-04-13 MED ORDER — PROPOFOL 10 MG/ML IV BOLUS
INTRAVENOUS | Status: DC | PRN
  Administered 2024-04-13: 09:00:00 150 mg via INTRAVENOUS

## 2024-04-13 MED ORDER — ORAL CARE MOUTH RINSE: 15.0000 mL | Freq: Once | OROMUCOSAL | Status: AC

## 2024-04-13 MED ORDER — LACTATED RINGERS IV SOLN: INTRAVENOUS | Status: DC

## 2024-04-13 MED ORDER — CHLORHEXIDINE GLUCONATE 0.12 % MT SOLN
OROMUCOSAL | Status: AC
  Filled 2024-04-13: qty 15

## 2024-04-13 MED ORDER — ONDANSETRON HCL 4 MG/2ML IJ SOLN
INTRAMUSCULAR | Status: DC | PRN
  Administered 2024-04-13: 10:00:00 4 mg via INTRAVENOUS

## 2024-04-13 MED ORDER — SODIUM CHLORIDE 0.9 % IV SOLN: INTRAVENOUS | Status: DC

## 2024-04-13 MED ORDER — MIDAZOLAM HCL 2 MG/2ML IJ SOLN
INTRAMUSCULAR | Status: AC
  Filled 2024-04-13: qty 2

## 2024-04-13 MED ORDER — MIDAZOLAM HCL (PF) 2 MG/2ML IJ SOLN
INTRAMUSCULAR | Status: DC | PRN
  Administered 2024-04-13: 09:00:00 2 mg via INTRAVENOUS

## 2024-04-13 SURGICAL SUPPLY — 23 items
BAG URINE DRAIN 2000ML AR STRL (UROLOGICAL SUPPLIES) IMPLANT
CATH URTH 16FR FL 2W BLN LF (CATHETERS) IMPLANT
DEVICE MYOSURE LITE (MISCELLANEOUS) IMPLANT
DEVICE MYOSURE REACH (MISCELLANEOUS) IMPLANT
DRSG TELFA 3X8 NADH STRL (GAUZE/BANDAGES/DRESSINGS) IMPLANT
ELECTRODE REM PT RTRN 9FT ADLT (ELECTROSURGICAL) ×1 IMPLANT
GLOVE BIO SURGEON STRL SZ 6 (GLOVE) ×1 IMPLANT
GLOVE BIOGEL PI IND STRL 6 (GLOVE) ×1 IMPLANT
GOWN STRL REUS W/ TWL LRG LVL3 (GOWN DISPOSABLE) ×2 IMPLANT
KIT PROCED FLUENT PRO FLT212S (KITS) ×1 IMPLANT
KIT TURNOVER CYSTO (KITS) ×1 IMPLANT
MANIFOLD NEPTUNE II (INSTRUMENTS) ×1 IMPLANT
PACK DNC HYST (MISCELLANEOUS) ×1 IMPLANT
PAD OB MATERNITY 11 LF (PERSONAL CARE ITEMS) ×1 IMPLANT
PAD PREP OB/GYN DISP 24X41 (PERSONAL CARE ITEMS) ×1 IMPLANT
SCRUB CHG 4% DYNA-HEX 4OZ (MISCELLANEOUS) ×1 IMPLANT
SEAL ROD LENS SCOPE MYOSURE (ABLATOR) ×1 IMPLANT
SET CYSTO IRRIGATION (SET/KITS/TRAYS/PACK) IMPLANT
SOL .9 NS 3000ML IRR UROMATIC (IV SOLUTION) ×1 IMPLANT
SOLN STERILE WATER 500 ML (IV SOLUTION) ×1 IMPLANT
SURGILUBE 2OZ TUBE FLIPTOP (MISCELLANEOUS) ×1 IMPLANT
TRAP FLUID SMOKE EVACUATOR (MISCELLANEOUS) ×1 IMPLANT
TUBING CONNECTING 10 (TUBING) ×1 IMPLANT

## 2024-04-13 NOTE — Anesthesia Preprocedure Evaluation (Signed)
 Anesthesia Evaluation  Patient identified by MRN, date of birth, ID band Patient awake    Reviewed: Allergy & Precautions, H&P , NPO status , Patient's Chart, lab work & pertinent test results, reviewed documented beta blocker date and time   Airway Mallampati: II   Neck ROM: full    Dental  (+) Poor Dentition   Pulmonary asthma , COPD,  COPD inhaler   Pulmonary exam normal        Cardiovascular Exercise Tolerance: Poor hypertension, On Medications negative cardio ROS Normal cardiovascular exam Rhythm:regular Rate:Normal     Neuro/Psych  Headaches PSYCHIATRIC DISORDERS Anxiety Depression     Neuromuscular disease  negative psych ROS   GI/Hepatic negative GI ROS, Neg liver ROS,GERD  Medicated,,  Endo/Other  diabetes, Well Controlled, Type 2, Oral Hypoglycemic Agents  Class 3 obesity  Renal/GU      Musculoskeletal   Abdominal   Peds  Hematology negative hematology ROS (+)   Anesthesia Other Findings Past Medical History: No date: Anxiety No date: Arthritis No date: Asthma No date: Cerebral palsy (HCC) No date: Complication of anesthesia     Comment:  panic attacks before surgery No date: COPD (chronic obstructive pulmonary disease) (HCC) No date: Depression No date: Diabetes mellitus without complication (HCC) No date: GERD (gastroesophageal reflux disease) No date: Hypertension Past Surgical History: 1995: CESAREAN SECTION No date: CHOLECYSTECTOMY 02/20/2018: DILATATION & CURETTAGE/HYSTEROSCOPY WITH MYOSURE; N/A     Comment:  Procedure: DILATATION & CURETTAGE/HYSTEROSCOPY WITH               MYOSURE ENDOMETRIAL POLYPECTOMY;  Surgeon: Leonce Garnette BIRCH, MD;  Location: [facility] ORS;  Service: Gynecology;              Laterality: N/A; No date: FOOT CAPSULE RELEASE W/ PERCUTANEOUS HEEL CORD LENGTHENING,  TIBIAL TENDON TRANSFER     Comment:  age 2 ,and 2011-both legs No date: JOINT REPLACEMENT;  Right     Comment:  both knees partial replacements dates unknown No date: knee replacment     Comment:  x 5 per pt. last one- left knee 2014. has had 2 on R 3               on L 01/08/2018: TYMPANOPLASTY WITH GRAFT; Right     Comment:  Procedure: TYMPANOPLASTY WITH POSSIBLE OSSICULAR CHAIN               RECONSTRUCTION;  Surgeon: Blair Mt, MD;  Location:               [facility] ORS;  Service: ENT;  Laterality: Right; BMI    Body Mass Index: 40.24 kg/m     Reproductive/Obstetrics negative OB ROS                              Anesthesia Physical Anesthesia Plan  ASA: 3  Anesthesia Plan: General and General ETT   Post-op Pain Management:    Induction: Intravenous  PONV Risk Score and Plan: 4 or greater and Ondansetron , Dexamethasone  and Midazolam   Airway Management Planned: Oral ETT  Additional Equipment:   Intra-op Plan:   Post-operative Plan: Extubation in OR  Informed Consent: I have reviewed the patients History and Physical, chart, labs and discussed the procedure including the risks, benefits and alternatives for the proposed anesthesia with the patient or authorized representative who has indicated his/her understanding and  acceptance.     Dental Advisory Given  Plan Discussed with: Anesthesiologist, CRNA and Surgeon  Anesthesia Plan Comments: (Patient consented for risks of anesthesia including but not limited to:  - adverse reactions to medications - damage to eyes, teeth, lips or other oral mucosa - nerve damage due to positioning  - sore throat or hoarseness - Damage to heart, brain, nerves, lungs, other parts of body or loss of life  Patient voiced understanding and assent.)        Anesthesia Quick Evaluation

## 2024-04-13 NOTE — Anesthesia Postprocedure Evaluation (Signed)
 Anesthesia Post Note  Patient: Traci Davis  Procedure(s) Performed: DILATATION AND CURETTAGE /HYSTEROSCOPY (Uterus)  Patient location during evaluation: PACU Anesthesia Type: General Level of consciousness: awake and alert Pain management: pain level controlled Vital Signs Assessment: post-procedure vital signs reviewed and stable Respiratory status: spontaneous breathing, nonlabored ventilation, respiratory function stable and patient connected to nasal cannula oxygen Cardiovascular status: blood pressure returned to baseline and stable Postop Assessment: no apparent nausea or vomiting Anesthetic complications: no   No notable events documented.   Last Vitals:  Vitals:   04/13/24 1102 04/13/24 1130  BP: 130/88 139/75  Pulse: 93 87  Resp: 14 16  Temp: (!) 36.2 C 36.6 C  SpO2: 98% 95%    Last Pain:  Vitals:   04/13/24 1130  TempSrc: Temporal  PainSc: 4                  Debby Mines

## 2024-04-13 NOTE — Anesthesia Procedure Notes (Signed)
 Procedure Name: Intubation Date/Time: 04/13/2024 9:23 AM  Performed by: Gillermo Spruce I, CRNAPre-anesthesia Checklist: Patient identified, Emergency Drugs available, Suction available and Patient being monitored Patient Re-evaluated:Patient Re-evaluated prior to induction Oxygen Delivery Method: Circle system utilized Preoxygenation: Pre-oxygenation with 100% oxygen Induction Type: IV induction Ventilation: Mask ventilation without difficulty Laryngoscope Size: McGrath and 3 Grade View: Grade I Tube type: Oral Tube size: 7.0 mm Number of attempts: 1 Airway Equipment and Method: Stylet and Oral airway Placement Confirmation: ETT inserted through vocal cords under direct vision, positive ETCO2 and breath sounds checked- equal and bilateral Secured at: 21 cm Tube secured with: Tape Dental Injury: Teeth and Oropharynx as per pre-operative assessment

## 2024-04-13 NOTE — H&P (Signed)
 Traci PREOPERATIVE HISTORY AND PHYSICAL   Subjective:  Traci Davis is a 53 y.o. G1P1001 here for surgical management of postmenopausal bleeding and thickened endometrium.   Indications for procedure include: unable to position/tolerate in-office biopsy. No significant preoperative concerns.  Proposed surgery: hysteroscopy with D&C  Pertinent Gynecological History: Menses: post-menopausal Bleeding: post menopausal bleeding Contraception: post menopausal status Last mammogram: needed further imaging Date: 2019 Last pap: normal Date: 01/04/23   Past Medical History:  Diagnosis Date   Acute pancreatitis 09/04/2014   Acute pancreatitis    Anxiety    Arthritis    Asthma    Cerebral palsy (HCC)    Complication of anesthesia    panic attacks before surgery   COPD (chronic obstructive pulmonary disease) (HCC)    Depression    Diabetes mellitus without complication (HCC)    GERD (gastroesophageal reflux disease)    Hypertension    Noninfectious gastroenteritis and colitis 01/10/2013   PMB (postmenopausal bleeding)    Thickened endometrium    Past Surgical History:  Procedure Laterality Date   CESAREAN SECTION  1995   CHOLECYSTECTOMY     DILATATION & CURETTAGE/HYSTEROSCOPY WITH MYOSURE N/A 02/20/2018   Procedure: DILATATION & CURETTAGE/HYSTEROSCOPY WITH MYOSURE ENDOMETRIAL POLYPECTOMY;  Surgeon: Leonce Garnette BIRCH, MD;  Location: [facility] ORS;  Service: Traci;  Laterality: N/A;   ESOPHAGOGASTRODUODENOSCOPY (EGD) WITH PROPOFOL  N/A 01/01/2019   Procedure: ESOPHAGOGASTRODUODENOSCOPY (EGD) WITH PROPOFOL ;  Surgeon: Unk Corinn Skiff, MD;  Location: [facility] ENDOSCOPY;  Service: Gastroenterology;  Laterality: N/A;   FOOT CAPSULE RELEASE W/ PERCUTANEOUS HEEL CORD LENGTHENING, TIBIAL TENDON TRANSFER     age 39 ,and 2010-both legs   JOINT REPLACEMENT Right    both knees partial replacements dates unknown   knee replacment     x 5 per pt. last one- left knee 2014. has had 2 on R 3  on L   TYMPANOPLASTY WITH GRAFT Right 01/08/2018   Procedure: TYMPANOPLASTY WITH POSSIBLE OSSICULAR CHAIN RECONSTRUCTION;  Surgeon: Blair Mt, MD;  Location: [facility] ORS;  Service: ENT;  Laterality: Right;   OB History  Gravida Para Term Preterm AB Living  1 1 1   1   SAB IAB Ectopic Multiple Live Births      1    # Outcome Date GA Lbr Len/2nd Weight Sex Type Anes PTL Lv  1 Term     F CS-LTranv   LIV  Patient denies any other pertinent gynecologic issues.  Family History  Problem Relation Age of Onset   CAD Father    Heart attack Brother    Sexual abuse Paternal Grandfather    Breast cancer Neg Hx    Social History   Socioeconomic History   Marital status: Single    Spouse name: Not on file   Number of children: 1   Years of education: Not on file   Highest education level: Associate degree: occupational, scientist, product/process development, or vocational program  Occupational History   Not on file  Tobacco Use   Smoking status: Never   Smokeless tobacco: Never  Vaping Use   Vaping status: Never Used  Substance and Sexual Activity   Alcohol use: No   Drug use: No   Sexual activity: Yes    Partners: Male    Birth control/protection: Condom  Other Topics Concern   Not on file  Social History Narrative   Lives with botfriend   Social Drivers of Health   Tobacco Use: Low Risk (04/13/2024)   Patient History    Smoking  Tobacco Use: Never    Smokeless Tobacco Use: Never    Passive Exposure: Not on file  Financial Resource Strain: Low Risk  (01/27/2024)   Received from [facility]   Overall Financial Resource Strain (CARDIA)    Difficulty of Paying Living Expenses: Not hard at all  Food Insecurity: No Food Insecurity (01/27/2024)   Received from [facility]   Epic    Within the past 12 months, you worried that your food would run out before you got the money to buy more.: Never true    Within the past 12 months, the food you bought just didn't last and you  didn't have money to get more.: Never true  Transportation Needs: No Transportation Needs (01/27/2024)   Received from [facility] - Transportation    In the past 12 months, has lack of transportation kept you from medical appointments or from getting medications?: No    Lack of Transportation (Non-Medical): No  Physical Activity: Insufficiently Active (05/09/2023)   Exercise Vital Sign    Days of Exercise per Week: 3 days    Minutes of Exercise per Session: 30 min  Stress: No Stress Concern Present (05/23/2023)   Received from [facility] - Occupational Stress Questionnaire    Feeling of Stress : Only a little  Social Connections: Socially Integrated (05/23/2023)   Received from [facility]   Social Connection and Isolation Panel    In a typical week, how many times do you talk on the phone with family, friends, or neighbors?: More than three times a week    How often do you get together with friends or relatives?: More than three times a week    How often do you attend church or religious services?: More than 4 times per year    Do you belong to any clubs or organizations such as church groups, unions, fraternal or athletic groups, or school groups?: Yes    How often do you attend meetings of the clubs or organizations you belong to?: More than 4 times per year    Are you married, widowed, divorced, separated, never married, or living with a partner?: Living with partner  Recent Concern: Social Connections - Moderately Isolated (05/09/2023)   Social Connection and Isolation Panel    Frequency of Communication with Friends and Family: Three times a week    Frequency of Social Gatherings with Friends and Family: Twice a week    Attends Religious Services: More than 4 times per year    Active Member of [employer] or Organizations: No    Attends Engineer, Structural: Not on file    Marital Status: Never married   Intimate Partner Violence: Not At Risk (05/23/2023)   Received from [facility]   Epic    Within the last year, have you been afraid of your partner or ex-partner?: No    Within the last year, have you been humiliated or emotionally abused in other ways by your partner or ex-partner?: No    Within the last year, have you been kicked, hit, slapped, or otherwise physically hurt by your partner or ex-partner?: No    Within the last year, have you been raped or forced to have any kind of sexual activity by your partner or ex-partner?: No  Depression (PHQ2-9): Low Risk (02/27/2023)   Depression (PHQ2-9)    PHQ-2 Score: 0  Alcohol Screen:  Not on file  Housing: Low Risk  (01/27/2024)   Received from [facility]   Epic    In the last 12 months, was there a time when you were not able to pay the mortgage or rent on time?: No    In the past 12 months, how many times have you moved where you were living?: 0    At any time in the past 12 months, were you homeless or living in a shelter (including now)?: No  Utilities: Not At Risk (01/27/2024)   Received from [facility]   Epic    In the past 12 months has the electric, gas, oil, or water company threatened to shut off services in your home?: No  Health Literacy: Low Risk (05/23/2023)   Received from [facility]    How often do you need to have someone help you when you read instructions, pamphlets, or other written material from your doctor or pharmacy?: Never   Medications Ordered Prior to Encounter[1] Allergies[2]    Review of Systems Constitutional: No recent fever/chills/sweats Respiratory: No recent cough/bronchitis Cardiovascular: No chest pain Gastrointestinal: No recent nausea/vomiting/diarrhea Genitourinary: No UTI symptoms Hematologic/lymphatic:No history of coagulopathy or recent blood thinner use    Objective:   Blood pressure (!) 174/101, pulse 96, temperature (!)  96.7 F (35.9 C), temperature source Temporal, resp. rate 17, last menstrual period 08/20/2014, SpO2 98%. CONSTITUTIONAL: Well-developed, well-nourished female in no acute distress.  HENT:  Normocephalic, atraumatic, External right and left ear normal. Oropharynx is clear and moist EYES: Conjunctivae and EOM are normal. Pupils are equal, round, and reactive to light. No scleral icterus.  NECK: Normal range of motion, supple, no masses SKIN: Skin is warm and dry. No rash noted. Not diaphoretic. No erythema. No pallor. NEUROLOGIC: Alert and oriented to person, place, and time. Normal reflexes, muscle tone coordination. No cranial nerve deficit noted. PSYCHIATRIC: Normal mood and affect. Normal behavior. Normal judgment and thought content. CARDIOVASCULAR: Normal heart rate noted, regular rhythm RESPIRATORY: Effort and breath sounds normal, no problems with respiration noted ABDOMEN: Soft, nontender, nondistended. PELVIC: Deferred MUSCULOSKELETAL: Trace BLEv edema and no tenderness. 2+ distal pulses.    Labs: Results for orders placed or performed during the hospital encounter of 04/13/24 (from the past 2 weeks)  Glucose, capillary   Collection Time: 04/13/24  7:35 AM  Result Value Ref Range   Glucose-Capillary 176 (H) 70 - 99 mg/dL     Imaging Studies: No results found.  Assessment:     53 y.o. G1P1001 with PMB and thickened endometrium (10mm), unable to tolerate in-office EMB, here for hysteroscopy and D&C.       Plan:    Counseling: Procedure, risks, reasons, benefits and complications (including injury to bowel, bladder, major blood vessel, ureter, bleeding, possibility of transfusion, infection, or fistula formation) reviewed in detail. Likelihood of success in alleviating the patient's condition was discussed. Routine postoperative instructions will be reviewed with the patient and her family in detail after surgery.  The patient concurred with the proposed plan, giving informed  written consent for the surgery.   -To OR when ready -Pain medications: pt already has home Percocet -Post- op visit scheduled 05/14/23     Estil Mangle, DO [facility]     [1]  No current facility-administered medications on file prior to encounter.   Current Outpatient Medications on File Prior to Encounter  Medication Sig Dispense Refill   acetaminophen  (TYLENOL )  325 MG tablet Take 2 tablets (650 mg total) by mouth every 6 (six) hours as needed. 60 tablet 0   albuterol  (PROVENTIL  HFA;VENTOLIN  HFA) 108 (90 BASE) MCG/ACT inhaler Inhale 2 puffs into the lungs 4 (four) times daily as needed. For shortness of breath and/or wheezing     amLODipine  (NORVASC ) 5 MG tablet Take 1 tablet (5 mg total) by mouth daily. 90 tablet 3   atorvastatin  (LIPITOR) 20 MG tablet Take 1 tablet (20 mg total) by mouth daily. 90 tablet 3   azelastine (ASTELIN) 0.1 % nasal spray Place into both nostrils.     buprenorphine (BUTRANS) 15 MCG/HR 1 patch once a week.     busPIRone  (BUSPAR ) 10 MG tablet Take 1 tablet (10 mg total) by mouth 3 (three) times daily. 270 tablet 1   CREON  36000-114000 units CPEP capsule Take 3 capsules (108,000 Units total) by mouth 3 (three) times daily with meals. 300 capsule 5   dicyclomine  (BENTYL ) 10 MG capsule Take 2 capsules in morning with meal and 1 capsule in evening with meal. 90 capsule 5   estradiol (ESTRACE) 0.1 MG/GM vaginal cream Place 1 Applicatorful vaginally 3 (three) times a week.     FLUoxetine  (PROZAC ) 20 MG capsule Take 1 capsule (20 mg total) by mouth daily. Take along with 40 mg daily - total of 60 mg daily 90 capsule 1   FLUoxetine  (PROZAC ) 40 MG capsule Take 1 capsule (40 mg total) by mouth daily. Take along with 20 mg daily 90 capsule 1   fluticasone  (FLONASE ) 50 MCG/ACT nasal spray Place 2 sprays into both nostrils daily. 48 g 3   furosemide  (LASIX ) 40 MG tablet Take 1 tablet (40 mg total) by mouth daily as needed for fluid. 30 tablet 5    gabapentin (NEURONTIN) 300 MG capsule Take 600 mg by mouth 3 (three) times daily.     insulin  glargine (LANTUS ) 100 UNIT/ML injection Inject 46 Units into the skin.     lamoTRIgine  (LAMICTAL ) 25 MG tablet Take 1 tablet (25 mg total) by mouth daily. 90 tablet 1   lidocaine  (LIDODERM ) 5 % Place 2 patches onto the skin daily.     linaclotide (LINZESS) 72 MCG capsule Take 72 mcg by mouth daily before breakfast.     lisinopril  (ZESTRIL ) 20 MG tablet Take 1 tablet (20 mg total) by mouth daily. 90 tablet 3   mirabegron  ER (MYRBETRIQ ) 50 MG TB24 tablet Take 1 tablet (50 mg total) by mouth daily. 30 tablet 3   montelukast  (SINGULAIR ) 10 MG tablet Take 1 tablet (10 mg total) by mouth at bedtime. 90 tablet 3   nystatin  powder Apply 1 Application topically 3 (three) times daily. Until rash resolved, repeat use for another flare if needed 30 g 3   omeprazole  (PRILOSEC) 40 MG capsule Take 1 capsule (40 mg total) by mouth in the morning and at bedtime. 90 capsule 3   ondansetron  (ZOFRAN -ODT) 4 MG disintegrating tablet Take 1 tablet (4 mg total) by mouth every 8 (eight) hours as needed for nausea or vomiting. 30 tablet 2   oxyCODONE -acetaminophen  (PERCOCET) 10-325 MG tablet Take 1 tablet by mouth 3 (three) times daily as needed.     SUMAtriptan (IMITREX) 100 MG tablet Take 100 mg by mouth as directed. As needed for migraines     triamcinolone cream (KENALOG) 0.1 % Apply 1 Application topically 3 (three) times daily as needed.     Blood Glucose Monitoring Suppl (GLUCOCOM BLOOD GLUCOSE MONITOR) DEVI Test daily before all  meals/snacks and once before bedtime.     Continuous Glucose Sensor (DEXCOM G7 SENSOR) MISC Use 1 sensor every 10 days for continuous glucose monitoring 9 each 3   donepezil (ARICEPT) 10 MG tablet Take 10 mg by mouth at bedtime.     glucose blood test strip USE TO TEST BLOOD SUGAR TWO TIMES A DAY     GVOKE HYPOPEN  2-PACK 1 MG/0.2ML SOAJ Inject 1 mg into the skin as needed (hypoglycemia). 1 mL 0    HUMALOG KWIKPEN 100 UNIT/ML KwikPen Inject 10 Units under the skin Three (3) times a day before meals.     incobotulinumtoxinA (XEOMIN) 100 units SOLR injection Inject 100 Units into the muscle every 3 (three) months.      Incontinence Supply Disposable (PREVAIL BLADDER CONTROL PAD) MISC Large incontinence pads to go into underwear     Insulin  Pen Needle (GLOBAL EASE INJECT PEN NEEDLES) 31G X 5 MM MISC Use pen needle to inject insulin  daily. 100 each 0   Lancets (FREESTYLE) lancets Test daily before all meals/snacks     metFORMIN  (GLUCOPHAGE -XR) 500 MG 24 hr tablet Take 2 tablets (1,000 mg total) by mouth 2 (two) times daily. 360 tablet 3   mirtazapine  (REMERON ) 15 MG tablet TAKE 1 and 1/2 TABLETS BY MOUTH AT BEDTIME 135 tablet 1   misoprostol  (CYTOTEC ) 200 MCG tablet Take 3 pills by mouth the night before biopsy. 3 tablet 0   misoprostol  (CYTOTEC ) 200 MCG tablet Take 3 pills by mouth the night before dilation and curettage. 3 tablet 0   naloxone (NARCAN) nasal spray 4 mg/0.1 mL     [2]  Allergies Allergen Reactions   Meloxicam Other (See Comments)    Damage kidney   Morphine And Codeine Other (See Comments)    hallucinations   Vantin [Cefpodoxime] Nausea And Vomiting   Latex Itching   Baclofen Other (See Comments)    makes cerebral palsy do adverse reaction on me, tightens muscles    Ciprofloxacin  Itching and Nausea And Vomiting   Prednisone Other (See Comments)    Elevated blood glucose   Tape Rash    skin tears.  Paper tape is ok   Tramadol Nausea Only

## 2024-04-13 NOTE — Op Note (Signed)
 OPERATIVE NOTE  DATE OF PROCEDURE:  04/13/2024  PRE-OPERATIVE DIAGNOSIS:  1) PMB, thickened endometrium  POST-OPERATIVE DIAGNOSIS:  Post-Op Diagnosis Codes:    * Thickened endometrium [R93.89]    * PMB (postmenopausal bleeding) [N95.0]  OPERATION:  Hysteroscopy, Myosure polyp removal, D&C  SURGEON(S): Surgeons and Role:    * Leigh Sober, MD - Primary   ANESTHESIA: General  DISTENSION MEDIA: Normal saline In: 1755cc, Out: 1740cc; Deficit: 15cc  ESTIMATED BLOOD LOSS: 5 mL  OPERATIVE FINDINGS: Normal, anteverted uterus, bilateral tubal ostia normal appearing; large endometrial polyp at fundus of uterus  SPECIMEN:  ID Type Source Tests Collected by Time Destination  1 : Endometrial curetting GYN Endometrium Curettage SURGICAL PATHOLOGY Leigh Sober, MD 04/13/2024 1000   2 : Endometrial polyp Tissue PATH Other SURGICAL PATHOLOGY Leigh Sober, MD 04/13/2024 1001     COMPLICATIONS: None  DRAINS: None  DISPOSITION: Stable to recovery room  DESCRIPTION OF PROCEDURE: The patient was prepped and draped in the dorsal lithotomy position and placed under general anesthesia. Her bladder was emptied. The cervix was grasped with a single-toothed tenaculum. Respecting the position and curvature of her cervix, it was sequentially dilated to accommodate the hysteroscope.  A large polyp was noted as above at the fundus. The Myosure Lite was inserted and used to morcellate the polyp until it was flush with the uterine body. The Myosure and the hysteroscope were then removed.  A systematic curettage was performed in all quadrants until a gritty texture was noted. The hysteroscope was replaced and the endometrium inspected. No abnormalities were seen. Hemostasis noted at the area of the polypectomy. The tenaculum was removed from the cervix, pressure from a sponge stick used and hemostasis was noted. The speculum was removed and the patient went to recovery room in stable condition.   Sober Leigh,  DO [facility]

## 2024-04-13 NOTE — Transfer of Care (Signed)
 Immediate Anesthesia Transfer of Care Note  Patient: Traci Davis  Procedure(s) Performed: DILATATION AND CURETTAGE /HYSTEROSCOPY (Uterus)  Patient Location: PACU  Anesthesia Type:General  Level of Consciousness: drowsy, pateint uncooperative, and responds to stimulation  Airway & Oxygen Therapy: Patient Spontanous Breathing and Patient connected to nasal cannula oxygen  Post-op Assessment: Report given to RN and Post -op Vital signs reviewed and stable  Post vital signs: stable  Last Vitals:  Vitals Value Taken Time  BP 146/77 04/13/24 10:30  Temp    Pulse 87 04/13/24 10:31  Resp 17 04/13/24 10:31  SpO2 100 % 04/13/24 10:31  Vitals shown include unfiled device data.  Last Pain:  Vitals:   04/13/24 0738  TempSrc: Temporal  PainSc: 2          Complications: No notable events documented.

## 2024-04-14 ENCOUNTER — Encounter: Admitting: Physical Therapy

## 2024-04-14 ENCOUNTER — Encounter: Payer: Self-pay | Admitting: Obstetrics

## 2024-04-14 ENCOUNTER — Telehealth: Payer: Self-pay

## 2024-04-14 LAB — SURGICAL PATHOLOGY

## 2024-04-14 NOTE — Telephone Encounter (Signed)
 TRIAGE VOICEMAIL: Patient states she had a procedure done 04/13/24. She's inquiring exactly when she will get the results. She doesn't want to wait til 05/13/24 to find out what happened/what was wrong.

## 2024-04-16 ENCOUNTER — Encounter: Admitting: Physical Therapy

## 2024-04-16 ENCOUNTER — Ambulatory Visit: Admitting: Physical Therapy

## 2024-04-16 NOTE — Telephone Encounter (Signed)
 Mailbox full . Unable to leave message

## 2024-04-20 ENCOUNTER — Telehealth: Admitting: Psychiatry

## 2024-04-20 ENCOUNTER — Encounter: Payer: Self-pay | Admitting: Psychiatry

## 2024-04-20 DIAGNOSIS — G4701 Insomnia due to medical condition: Secondary | ICD-10-CM | POA: Diagnosis not present

## 2024-04-20 DIAGNOSIS — F411 Generalized anxiety disorder: Secondary | ICD-10-CM | POA: Diagnosis not present

## 2024-04-20 DIAGNOSIS — F3342 Major depressive disorder, recurrent, in full remission: Secondary | ICD-10-CM

## 2024-04-20 NOTE — Progress Notes (Unsigned)
 Virtual Visit via Video Note  I connected with Traci Davis on 04/20/2024 at  3:30 PM EST by a video enabled telemedicine application and verified that I am speaking with the correct person using two identifiers.  Location Provider Location : [facility] Patient Location : Home  Participants: Patient , Provider   I discussed the limitations of evaluation and management by telemedicine and the availability of in person appointments. The patient expressed understanding and agreed to proceed.   I discussed the assessment and treatment plan with the patient. The patient was provided an opportunity to ask questions and all were answered. The patient agreed with the plan and demonstrated an understanding of the instructions.   The patient was advised to call back or seek an in-person evaluation if the symptoms worsen or if the condition fails to improve as anticipated.  BH MD OP Progress Note  04/20/2024 3:41 PM JOYLENE WESCOTT  MRN:  [id number]  Chief Complaint:  Chief Complaint  Patient presents with   Follow-up   Anxiety   Depression   Medication Refill   Discussed the use of AI scribe software for clinical note transcription with the patient, who gave verbal consent to proceed.  History of Present Illness Traci Davis is a 53 year old Caucasian female, lives in [facility], has a history of MDD, GAD, insomnia, cerebral palsy, diabetes mellitus on SSD was evaluated by telemedicine today.  Low mood and increased sadness, especially around the holidays, are reported. She reports emotional distress related to family discord, including her mother not speaking to two of her children and her siblings spending Christmas separately. Grief related to the deaths of her grandmother a few years ago and her aunt six months later continues to cause distress. She finds these losses and family changes particularly challenging during the holiday season and reports feeling upset.  Over the past 3 to 4  nights, she has stayed up late, until 1 or 2 a.m., wrapping gifts and watching TV, and she connects some of her increased daytime sleep to these late nights.  This disrupted her sleep and she has been excessively sleeping during the day to catch up.  She continues to engage in activities she enjoys, such as watching favorite shows.  She denies suicidal thoughts and thoughts of harming others. She is not currently seeing a therapist but relies on her two best friends, her sister, and her daughter for support, and she feels comfortable with this arrangement at this time.  Her current medications include fluoxetine  60 mg, lamotrigine  25 mg, buspirone  10 mg three times daily, and mirtazapine  22.5 mg at bedtime. She expresses frustration about the number of medications, stating that previous attempts to discontinue medications have resulted in a return of symptoms, requiring her to restart them.  She is not interested in additional further medications at this time.  She plans to spend Christmas Eve at a friend's house and visit her daughter on Christmas Day.    Visit Diagnosis:    ICD-10-CM   1. MDD (major depressive disorder), recurrent, in full remission  F33.42     2. GAD (generalized anxiety disorder)  F41.1     3. Insomnia due to medical condition  G47.01    Pain, mood      Past Psychiatric History: I have reviewed past psychiatric history from progress note on 09/12/2017.  Past trials of Xanax, Klonopin, Cymbalta , Rozerem , Ambien , trazodone , doxepin   Past Medical History:  Past Medical History:  Diagnosis Date   Acute  pancreatitis 09/04/2014   Acute pancreatitis    Anxiety    Arthritis    Asthma    Cerebral palsy (HCC)    Complication of anesthesia    panic attacks before surgery   COPD (chronic obstructive pulmonary disease) (HCC)    Depression    Diabetes mellitus without complication (HCC)    GERD (gastroesophageal reflux disease)    Hypertension    Noninfectious  gastroenteritis and colitis 01/10/2013   PMB (postmenopausal bleeding)    Thickened endometrium     Past Surgical History:  Procedure Laterality Date   CESAREAN SECTION  1995   CHOLECYSTECTOMY     DILATATION & CURETTAGE/HYSTEROSCOPY WITH MYOSURE N/A 02/20/2018   Procedure: DILATATION & CURETTAGE/HYSTEROSCOPY WITH MYOSURE ENDOMETRIAL POLYPECTOMY;  Surgeon: Leonce Garnette BIRCH, MD;  Location: [facility] ORS;  Service: Gynecology;  Laterality: N/A;   ESOPHAGOGASTRODUODENOSCOPY (EGD) WITH PROPOFOL  N/A 01/01/2019   Procedure: ESOPHAGOGASTRODUODENOSCOPY (EGD) WITH PROPOFOL ;  Surgeon: Unk Corinn Skiff, MD;  Location: [facility] ENDOSCOPY;  Service: Gastroenterology;  Laterality: N/A;   FOOT CAPSULE RELEASE W/ PERCUTANEOUS HEEL CORD LENGTHENING, TIBIAL TENDON TRANSFER     age 32 ,and 2010-both legs   HYSTEROSCOPY WITH D & C N/A 04/13/2024   Procedure: DILATATION AND CURETTAGE /HYSTEROSCOPY;  Surgeon: Leigh Sober, MD;  Location: [facility] ORS;  Service: Gynecology;  Laterality: N/A;   JOINT REPLACEMENT Right    both knees partial replacements dates unknown   knee replacment     x 5 per pt. last one- left knee 2014. has had 2 on R 3 on L   TYMPANOPLASTY WITH GRAFT Right 01/08/2018   Procedure: TYMPANOPLASTY WITH POSSIBLE OSSICULAR CHAIN RECONSTRUCTION;  Surgeon: Blair Mt, MD;  Location: [facility] ORS;  Service: ENT;  Laterality: Right;    Family Psychiatric History: I have reviewed family psychiatric history from progress note on 09/12/2017.  Family History:  Family History  Problem Relation Age of Onset   CAD Father    Heart attack Brother    Sexual abuse Paternal Grandfather    Breast cancer Neg Hx     Social History: I have reviewed social history from progress note on 09/12/2017. Social History   Socioeconomic History   Marital status: Single    Spouse name: Not on file   Number of children: 1   Years of education: Not on file   Highest education level: Associate degree: occupational, scientist, product/process development, or  vocational program  Occupational History   Not on file  Tobacco Use   Smoking status: Never   Smokeless tobacco: Never  Vaping Use   Vaping status: Never Used  Substance and Sexual Activity   Alcohol use: No   Drug use: No   Sexual activity: Yes    Partners: Male    Birth control/protection: Condom  Other Topics Concern   Not on file  Social History Narrative   Lives with botfriend   Social Drivers of Health   Tobacco Use: Low Risk (04/20/2024)   Patient History    Smoking Tobacco Use: Never    Smokeless Tobacco Use: Never    Passive Exposure: Not on file  Financial Resource Strain: Low Risk  (01/27/2024)   Received [facility]   Overall Financial Resource Strain (CARDIA)    Difficulty of Paying Living Expenses: Not hard at all  Food Insecurity: No Food Insecurity (01/27/2024)   Received from [facility]   Epic    Within the past 12 months, you worried that your food would run out  before you got the money to buy more.: Never true    Within the past 12 months, the food you bought just didn't last and you didn't have money to get more.: Never true  Transportation Needs: No Transportation Needs (01/27/2024)   Received from [facility] - Transportation    In the past 12 months, has lack of transportation kept you from medical appointments or from getting medications?: No    Lack of Transportation (Non-Medical): No  Physical Activity: Insufficiently Active (05/09/2023)   Exercise Vital Sign    Days of Exercise per Week: 3 days    Minutes of Exercise per Session: 30 min  Stress: No Stress Concern Present (05/23/2023)   Received from [facility] - Occupational Stress Questionnaire    Feeling of Stress : Only a little  Social Connections: Socially Integrated (05/23/2023)   Received from [facility]   Social Connection and Isolation Panel    In a typical week, how  many times do you talk on the phone with family, friends, or neighbors?: More than three times a week    How often do you get together with friends or relatives?: More than three times a week    How often do you attend church or religious services?: More than 4 times per year    Do you belong to any clubs or organizations such as church groups, unions, fraternal or athletic groups, or school groups?: Yes    How often do you attend meetings of the clubs or organizations you belong to?: More than 4 times per year    Are you married, widowed, divorced, separated, never married, or living with a partner?: Living with partner  Recent Concern: Social Connections - Moderately Isolated (05/09/2023)   Social Connection and Isolation Panel    Frequency of Communication with Friends and Family: Three times a week    Frequency of Social Gatherings with Friends and Family: Twice a week    Attends Religious Services: More than 4 times per year    Active Member of Clubs or Organizations: No    Attends Engineer, Structural: Not on file    Marital Status: Never married  Depression (PHQ2-9): Low Risk (02/27/2023)   Depression (PHQ2-9)    PHQ-2 Score: 0  Alcohol Screen: Not on file  Housing: Low Risk  (01/27/2024)   Received from [facility]   Epic    In the last 12 months, was there a time when you were not able to pay the mortgage or rent on time?: No    In the past 12 months, how many times have you moved where you were living?: 0    At any time in the past 12 months, were you homeless or living in a shelter (including now)?: No  Utilities: Not At Risk (01/27/2024)   Received from [facility]   Epic    In the past 12 months has the electric, gas, oil, or water company threatened to shut off services in your home?: No  Health Literacy: Low Risk (05/23/2023)   Received from [facility]    How often do you need to have someone help you when  you read instructions, pamphlets, or other written material from your doctor or pharmacy?: Never    Allergies: Allergies[1]  Metabolic Disorder Labs: Lab Results  Component Value Date   HGBA1C 9.6 (H)  09/04/2023   MPG 229 09/04/2023   No results found for: PROLACTIN Lab Results  Component Value Date   CHOL 226 (H) 09/04/2023   TRIG 198 (H) 09/04/2023   HDL 60 09/04/2023   CHOLHDL 3.8 09/04/2023   LDLCALC 132 (H) 09/04/2023   Lab Results  Component Value Date   TSH 1.87 09/04/2023   TSH 2.198 07/10/2016    Therapeutic Level Labs: No results found for: LITHIUM No results found for: VALPROATE No results found for: CBMZ  Current Medications: Current Outpatient Medications  Medication Sig Dispense Refill   trospium (SANCTURA) 20 MG tablet Take 20 mg by mouth. (Patient taking differently: Take 20 mg by mouth 2 (two) times daily.)     acetaminophen  (TYLENOL ) 325 MG tablet Take 2 tablets (650 mg total) by mouth every 6 (six) hours as needed. 60 tablet 0   albuterol  (PROVENTIL  HFA;VENTOLIN  HFA) 108 (90 BASE) MCG/ACT inhaler Inhale 2 puffs into the lungs 4 (four) times daily as needed. For shortness of breath and/or wheezing     amLODipine  (NORVASC ) 5 MG tablet Take 1 tablet (5 mg total) by mouth daily. 90 tablet 3   atorvastatin  (LIPITOR) 20 MG tablet Take 1 tablet (20 mg total) by mouth daily. 90 tablet 3   azelastine (ASTELIN) 0.1 % nasal spray Place into both nostrils.     Blood Glucose Monitoring Suppl (GLUCOCOM BLOOD GLUCOSE MONITOR) DEVI Test daily before all meals/snacks and once before bedtime.     buprenorphine (BUTRANS) 15 MCG/HR 1 patch once a week.     busPIRone  (BUSPAR ) 10 MG tablet Take 1 tablet (10 mg total) by mouth 3 (three) times daily. 270 tablet 1   Continuous Glucose Sensor (DEXCOM G7 SENSOR) MISC Use 1 sensor every 10 days for continuous glucose monitoring 9 each 3   CREON  36000-114000 units CPEP capsule Take 3 capsules (108,000 Units total) by mouth 3  (three) times daily with meals. 300 capsule 5   dicyclomine  (BENTYL ) 10 MG capsule Take 2 capsules in morning with meal and 1 capsule in evening with meal. 90 capsule 5   donepezil (ARICEPT) 10 MG tablet Take 10 mg by mouth at bedtime.     estradiol (ESTRACE) 0.1 MG/GM vaginal cream Place 1 Applicatorful vaginally 3 (three) times a week.     FLUoxetine  (PROZAC ) 20 MG capsule Take 1 capsule (20 mg total) by mouth daily. Take along with 40 mg daily - total of 60 mg daily 90 capsule 1   FLUoxetine  (PROZAC ) 40 MG capsule Take 1 capsule (40 mg total) by mouth daily. Take along with 20 mg daily 90 capsule 1   fluticasone  (FLONASE ) 50 MCG/ACT nasal spray Place 2 sprays into both nostrils daily. 48 g 3   furosemide  (LASIX ) 40 MG tablet Take 1 tablet (40 mg total) by mouth daily as needed for fluid. 30 tablet 5   gabapentin (NEURONTIN) 300 MG capsule Take 600 mg by mouth 3 (three) times daily.     glucose blood test strip USE TO TEST BLOOD SUGAR TWO TIMES A DAY     GVOKE HYPOPEN  2-PACK 1 MG/0.2ML SOAJ Inject 1 mg into the skin as needed (hypoglycemia). 1 mL 0   HUMALOG KWIKPEN 100 UNIT/ML KwikPen Inject 10 Units under the skin Three (3) times a day before meals.     incobotulinumtoxinA (XEOMIN) 100 units SOLR injection Inject 100 Units into the muscle every 3 (three) months.      Incontinence Supply Disposable (PREVAIL BLADDER CONTROL PAD) MISC Large incontinence  pads to go into underwear     insulin  glargine (LANTUS ) 100 UNIT/ML injection Inject 46 Units into the skin.     Insulin  Pen Needle (GLOBAL EASE INJECT PEN NEEDLES) 31G X 5 MM MISC Use pen needle to inject insulin  daily. 100 each 0   lamoTRIgine  (LAMICTAL ) 25 MG tablet Take 1 tablet (25 mg total) by mouth daily. 90 tablet 1   Lancets (FREESTYLE) lancets Test daily before all meals/snacks     lidocaine  (LIDODERM ) 5 % Place 2 patches onto the skin daily.     linaclotide (LINZESS) 72 MCG capsule Take 72 mcg by mouth daily before breakfast.      lisinopril  (ZESTRIL ) 20 MG tablet Take 1 tablet (20 mg total) by mouth daily. 90 tablet 3   metFORMIN  (GLUCOPHAGE -XR) 500 MG 24 hr tablet Take 2 tablets (1,000 mg total) by mouth 2 (two) times daily. 360 tablet 3   mirtazapine  (REMERON ) 15 MG tablet TAKE 1 and 1/2 TABLETS BY MOUTH AT BEDTIME 135 tablet 1   misoprostol  (CYTOTEC ) 200 MCG tablet Take 3 pills by mouth the night before biopsy. 3 tablet 0   misoprostol  (CYTOTEC ) 200 MCG tablet Take 3 pills by mouth the night before dilation and curettage. 3 tablet 0   montelukast  (SINGULAIR ) 10 MG tablet Take 1 tablet (10 mg total) by mouth at bedtime. 90 tablet 3   naloxone (NARCAN) nasal spray 4 mg/0.1 mL      nystatin  powder Apply 1 Application topically 3 (three) times daily. Until rash resolved, repeat use for another flare if needed 30 g 3   omeprazole  (PRILOSEC) 40 MG capsule Take 1 capsule (40 mg total) by mouth in the morning and at bedtime. 90 capsule 3   ondansetron  (ZOFRAN -ODT) 4 MG disintegrating tablet Take 1 tablet (4 mg total) by mouth every 8 (eight) hours as needed for nausea or vomiting. 30 tablet 2   oxyCODONE -acetaminophen  (PERCOCET) 10-325 MG tablet Take 1 tablet by mouth 3 (three) times daily as needed.     SUMAtriptan (IMITREX) 100 MG tablet Take 100 mg by mouth as directed. As needed for migraines     SYMBICORT 160-4.5 MCG/ACT inhaler Inhale 2 puffs into the lungs daily.     triamcinolone cream (KENALOG) 0.1 % Apply 1 Application topically 3 (three) times daily as needed.     No current facility-administered medications for this visit.     Musculoskeletal: Strength & Muscle Tone: UTA Gait & Station: Seated Patient leans: N/A  Psychiatric Specialty Exam: Review of Systems  Psychiatric/Behavioral:  Positive for sleep disturbance.        Grieving     Last menstrual period 08/20/2014.There is no height or weight on file to calculate BMI.  General Appearance: Casual  Eye Contact:  Fair  Speech:  Clear and Coherent   Volume:  Normal  Mood:  grieving  Affect:  Congruent  Thought Process:  Goal Directed and Descriptions of Associations: Intact  Orientation:  Full (Time, Place, and Person)  Thought Content: Logical   Suicidal Thoughts:  No  Homicidal Thoughts:  No  Memory:  Immediate;   Fair Recent;   Fair Remote;   Fair  Judgement:  Intact  Insight:  Fair  Psychomotor Activity:  Normal  Concentration:  Concentration: Fair and Attention Span: Fair  Recall:  [employer] of Knowledge: Fair  Language: Fair  Akathisia:  No  Handed:  Right  AIMS (if indicated): not done  Assets:  Communication Skills Desire for Improvement Housing Social Support  ADL's:  Intact  Cognition: WNL  Sleep:  varies   Screenings: AIMS    Flowsheet Row Office Visit from 04/05/2022 in [facility] Office Visit from 10/16/2021 in [facility]  AIMS Total Score 0 0   GAD-7    Flowsheet Row Video Visit from 12/06/2022 in [facility] Office Visit from 04/05/2022 in [facility] Video Visit from 06/06/2021 in [facility]  Total GAD-7 Score 5 8 1    PHQ2-9    Flowsheet Row Office Visit from 02/27/2023 in [facility] Video Visit from 12/06/2022 in [facility] Office Visit from 04/05/2022 in [facility] Video Visit from 11/13/2021 in [facility] Office Visit from 10/16/2021 in [facility]  PHQ-2 Total Score 0 0 2 0 1  PHQ-9 Total Score -- -- 9 -- 5   Flowsheet Row Video Visit from 04/20/2024 in [facility] Admission (Discharged) from 04/13/2024 in [facility] Video Visit  from 01/13/2024 in [facility]  C-SSRS RISK CATEGORY No Risk No Risk No Risk     Assessment and Plan: Traci Davis is a 53 year old Caucasian female who presented for a follow-up appointment, discussed assessment and plan as noted below.  1. MDD (major depressive disorder), recurrent, in full remission Currently denies any significant depression symptoms although does report anxiety mostly related to situational stressors, holiday season. Declines referral for psychotherapy Continue Prozac  60 mg daily Continue Lamotrigine  25 mg daily Continue BuSpar  10 mg 3 times a day  2. GAD (generalized anxiety disorder)-unstable Current anxiety mostly related to holiday season, interpersonal relationship stressors. Declines referral for CBT Continue Prozac  60 mg daily Continue BuSpar  10 mg 3 times a day Continue Mirtazapine  as prescribed Not interested in further medication changes.  She would like to get off of some of these medications. Plan to reevaluate in future sessions.  3. Insomnia due to medical condition-unstable Sleep problems due to lack of sleep hygiene especially with the holiday season. Encouraged to work on sleep hygiene techniques Continue Mirtazapine  22.5 mg daily at bedtime Will need sufficient pain management.  Follow-up Follow-up in clinic in 2 months or sooner in person.  Collaboration of Care: Collaboration of Care: Patient refused AEB declines referral for CBT  Patient/Guardian was advised Release of Information must be obtained prior to any record release in order to collaborate their care with an outside provider. Patient/Guardian was advised if they have not already done so to contact the registration department to sign all necessary forms in order for us  to release information regarding their care.   Consent: Patient/Guardian gives verbal consent for treatment and assignment of benefits for services provided during this  visit. Patient/Guardian expressed understanding and agreed to proceed.   This note was generated in part or whole with voice recognition software. Voice recognition is usually quite accurate but there are transcription errors that can and very often do occur. I apologize for any typographical errors that were not detected and corrected.    Carol Theys, MD 04/21/2024, 8:14 AM     [1]  Allergies Allergen Reactions   Meloxicam Other (See Comments)    Damage kidney   Morphine And Codeine Other (See Comments)    hallucinations   Vantin [Cefpodoxime] Nausea And  Vomiting   Latex Itching   Baclofen Other (See Comments)    makes cerebral palsy do adverse reaction on me, tightens muscles    Ciprofloxacin  Itching and Nausea And Vomiting   Prednisone Other (See Comments)    Elevated blood glucose   Tape Rash    skin tears.  Paper tape is ok   Tramadol Nausea Only

## 2024-04-21 ENCOUNTER — Ambulatory Visit: Admitting: Physical Therapy

## 2024-04-21 ENCOUNTER — Encounter: Admitting: Physical Therapy

## 2024-04-27 ENCOUNTER — Ambulatory Visit: Admitting: Physical Therapy

## 2024-04-27 ENCOUNTER — Encounter: Admitting: Physical Therapy

## 2024-04-29 ENCOUNTER — Ambulatory Visit: Admitting: Physical Therapy

## 2024-04-30 ENCOUNTER — Other Ambulatory Visit: Payer: Self-pay | Admitting: Psychiatry

## 2024-04-30 DIAGNOSIS — F411 Generalized anxiety disorder: Secondary | ICD-10-CM

## 2024-05-05 ENCOUNTER — Ambulatory Visit: Admitting: Physical Therapy

## 2024-05-05 ENCOUNTER — Encounter: Payer: Self-pay | Admitting: Family Medicine

## 2024-05-05 DIAGNOSIS — G8929 Other chronic pain: Secondary | ICD-10-CM

## 2024-05-05 DIAGNOSIS — G801 Spastic diplegic cerebral palsy: Secondary | ICD-10-CM

## 2024-05-05 DIAGNOSIS — Z96653 Presence of artificial knee joint, bilateral: Secondary | ICD-10-CM

## 2024-05-06 ENCOUNTER — Other Ambulatory Visit: Payer: Self-pay | Admitting: Family Medicine

## 2024-05-06 DIAGNOSIS — E1169 Type 2 diabetes mellitus with other specified complication: Secondary | ICD-10-CM

## 2024-05-06 MED ORDER — GABAPENTIN 300 MG PO CAPS: 900.0000 mg | ORAL_CAPSULE | Freq: Three times a day (TID) | ORAL | 2 refills | Status: AC

## 2024-05-07 ENCOUNTER — Ambulatory Visit

## 2024-05-07 NOTE — Telephone Encounter (Signed)
 No longer current dosing of this medication Requested Prescriptions  Pending Prescriptions Disp Refills   LANTUS  SOLOSTAR 100 UNIT/ML Solostar Pen [Pharmacy Med Name: Lantus  SoloStar 100 UNIT/ML Subcutaneous Solution Pen-injector] 45 mL 2    Sig: INJECT SUBCUTANEOUSLY 42 UNITS  DAILY     Endocrinology:  Diabetes - Insulins Failed - 05/07/2024 12:02 PM      Failed - HBA1C is between 0 and 7.9 and within 180 days    Hemoglobin A1C  Date Value Ref Range Status  01/22/2023 8  Final   Hgb A1c MFr Bld  Date Value Ref Range Status  09/04/2023 9.6 (H) <5.7 % Final    Comment:    For someone without known diabetes, a hemoglobin A1c value of 6.5% or greater indicates that they may have  diabetes and this should be confirmed with a follow-up  test. . For someone with known diabetes, a value <7% indicates  that their diabetes is well controlled and a value  greater than or equal to 7% indicates suboptimal  control. A1c targets should be individualized based on  duration of diabetes, age, comorbid conditions, and  other considerations. . Currently, no consensus exists regarding use of hemoglobin A1c for diagnosis of diabetes for children. SABRA Amy - Valid encounter within last 6 months    Recent Outpatient Visits           1 month ago Acute on chronic pancreatitis [facility])   [place] 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

## 2024-05-07 NOTE — Progress Notes (Signed)
 Traci Davis                                          MRN: [id number]   05/07/2024   The [facility] Quality Team Specialist reviewed this patient medical record for the purposes of chart review for care gap closure. The following were reviewed: chart review for care gap closure-glycemic status assessment.    [facility]I Quality Team

## 2024-05-07 NOTE — Addendum Note (Signed)
 Addended by: EDMAN MARSA PARAS on: 05/07/2024 01:03 PM   Modules accepted: Orders

## 2024-05-08 ENCOUNTER — Telehealth: Payer: Self-pay

## 2024-05-08 NOTE — Telephone Encounter (Signed)
 Please refer to previous message

## 2024-05-08 NOTE — Telephone Encounter (Signed)
 Copied from CRM [id number]. Topic: Appointments - Scheduling Inquiry for Clinic >> May 08, 2024  3:20 PM Delon T wrote: Reason for CRM: question about xray for knee, advised order is in system and it shows she can go for walk in xray in [facility]

## 2024-05-11 ENCOUNTER — Ambulatory Visit: Admitting: Physical Therapy

## 2024-05-12 ENCOUNTER — Ambulatory Visit

## 2024-05-13 ENCOUNTER — Encounter: Payer: Self-pay | Admitting: Obstetrics

## 2024-05-13 ENCOUNTER — Ambulatory Visit: Admitting: Obstetrics

## 2024-05-13 ENCOUNTER — Ambulatory Visit: Admitting: Physical Therapy

## 2024-05-13 VITALS — BP 137/82 | HR 91

## 2024-05-13 DIAGNOSIS — Z4889 Encounter for other specified surgical aftercare: Secondary | ICD-10-CM

## 2024-05-13 DIAGNOSIS — N951 Menopausal and female climacteric states: Secondary | ICD-10-CM

## 2024-05-13 MED ORDER — CLONIDINE HCL 0.1 MG PO TABS: 0.1000 mg | ORAL_TABLET | Freq: Every evening | ORAL | 3 refills | Status: AC

## 2024-05-13 NOTE — Progress Notes (Signed)
" ° ° °  OBSTETRICS/GYNECOLOGY POST-OPERATIVE CLINIC VISIT  Subjective:     Traci Davis is a 54 y.o. female who presents to the clinic 4 weeks status post laparoscopy and D&C for postmenopausal bleeding and thickened endometrium. Eating a regular diet without difficulty. Bowel movements are normal. The patient is not having any pain.  Patient says she spotted lightly for 2 days following her procedure, but her bleeding has resolved.  However, she is frustrated because she continues to have overwhelming hot flashes.  She says she wants everything out and is requesting a hysterectomy.  The following portions of the patient's history were reviewed and updated as appropriate: allergies, current medications, past family history, past medical history, past social history, past surgical history, and problem list.  Review of Systems Pertinent items are noted in HPI.   Objective:   BP 137/82   Pulse 91   LMP 08/20/2014 Comment: missed cup when trying to obtain urine spec There is no height or weight on file to calculate BMI.  General:  alert and no distress  Abdomen: soft, bowel sounds active, non-tender  Incision:   N/A    Pathology:  FINAL DIAGNOSIS        1. Endometrium, curettage,  :       - BENIGN INACTIVE ENDOMETRIUM       - NEGATIVE FOR HYPERPLASIA OR MALIGNANCY        2. Endometrial polyp,  :       - BENIGN ENDOMETRIAL POLYP       - NEGATIVE FOR HYPERPLASIA OR MALIGNANCY   Assessment:   Patient s/p hysteroscopy and D&C for PMB with thickened EMT. Doing well postoperatively. Also with hot flashes and multiple comorbidities, HTN, BMI, decreased mobility, T2DM, on multiple medications, including Prozac  and Gabapentin , which was recently increased.    Plan:   1. Continue any current medications as instructed by provider. 2. Wound care discussed. 3. Operative findings again reviewed. Pathology report discussed. 4. Activity restrictions: none 5. Anticipated return to work: not  applicable. 6. Follow up: prn, notify if PMB returns  Hot flashes -No estrogen due to multiple comorbidities and risks -Discussed hysterectomy will not solve her symptoms and would not recommend from a co-morbid standpoint -Rx for Clonidine  0.1mg  at bedtime prn -- do not use more than once per night; is already on Prozac , Gabapentin , and concerned for polypharmacy   Estil Mangle, DO [facility] "

## 2024-05-14 ENCOUNTER — Other Ambulatory Visit: Payer: Self-pay | Admitting: Family Medicine

## 2024-05-14 ENCOUNTER — Telehealth: Payer: Self-pay

## 2024-05-14 ENCOUNTER — Ambulatory Visit

## 2024-05-14 DIAGNOSIS — I10 Essential (primary) hypertension: Secondary | ICD-10-CM

## 2024-05-14 DIAGNOSIS — E1169 Type 2 diabetes mellitus with other specified complication: Secondary | ICD-10-CM

## 2024-05-14 NOTE — Telephone Encounter (Signed)
 Returned pt phone call. Recommended pt to get seen by her MD for her knee secondary to reports of pulling a muscle recently and pt has not been seen in PT for about 3 month to make sure she is clear to participate in PT for her knee. If pt cleared for PT, pt to provide an new referral for PT.  Pt also said that it is too cold to travel to PT sessions right now and would like to just do it in March 2026. Pt appointments canceled due to the aforementioned reasons. Pt agrees.   Pt also reports she was able to get the ankle portion of her brace for driving.

## 2024-05-15 NOTE — Telephone Encounter (Signed)
 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[facility])   [place] [facility] [place], Marsa PARAS, DO   7 months ago 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[facility])   [place] [facility] [place], Marsa PARAS, DO   8 months ago COVID-19   [facility] [place], Marsa PARAS, DO   8 months ago Exposure to confirmed case of COVID-19   [place] [facility] [place], Angeline ORN, NP               amLODipine  (NORVASC ) 5 MG tablet [Pharmacy Med Name: AMLODIPINE  BESYLATE 5 MG TAB] 90 tablet 0    Sig: TAKE 1 TABLET BY MOUTH ONCE DAILY     Cardiovascular: Calcium  Channel Blockers 2 Passed - 05/15/2024 11:59 AM      Passed - Last BP in normal range    BP Readings from Last 1 Encounters:  05/13/24 137/82         Passed - Last Heart Rate in normal range    Pulse Readings from Last 1 Encounters:  05/13/24 91         Passed - Valid encounter within last 6 months    Recent Outpatient Visits           1 month ago Acute on chronic pancreatitis [facility])   [place] [employer] [facility] [place], Marsa PARAS, DO   4 months ago 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[facility])   [place] [facility] [place], Marsa PARAS, DO   8 months ago COVID-19   [facility]  [place], Marsa PARAS, DO   8 months ago Exposure to confirmed case of COVID-19   [place] [facility] [place], [place], NP               metFORMIN  (GLUCOPHAGE -XR) 500 MG 24 hr tablet [Pharmacy Med Name: METFORMIN  HCL ER 500 MG TAB] 360 tablet 0    Sig: TAKE 2 TABLETS BY MOUTH TWICE DAILY     Endocrinology:  Diabetes - Biguanides Failed - 05/15/2024 11:59 AM      Failed - HBA1C is between 0 and 7.9 and within 180 days    Hemoglobin A1C  Date Value Ref Range Status  01/22/2023 8  Final   Hgb A1c MFr Bld  Date Value Ref Range Status  09/04/2023 9.6 (H) <5.7 % Final    Comment:    For someone without known diabetes, a hemoglobin A1c value of 6.5% or greater indicates that they may have  diabetes and this should be confirmed with a follow-up  test. . For someone with known diabetes, a value <7% indicates  that their diabetes is well controlled and a value  greater than or equal  to 7% indicates suboptimal  control. A1c targets should be individualized based on  duration of diabetes, age, comorbid conditions, and  other considerations. . Currently, no consensus exists regarding use of hemoglobin A1c for diagnosis of diabetes for children. .          Failed - B12 Level in normal range and within 720 days    Vitamin B-12  Date Value Ref Range Status  07/10/2016 245 180 - 914 pg/mL Final    Comment:    (NOTE) This assay is not validated for testing neonatal or myeloproliferative syndrome specimens for Vitamin B12 levels. Performed at [facility], [address] [place], [place] [address]          Passed - Cr in normal range and within 360 days    Creat  Date Value Ref Range Status  05/31/2023 0.88 0.50 - 1.03 mg/dL Final   Creatinine, Urine  Date Value Ref Range Status  09/04/2023 100 20 - 275 mg/dL Final         Passed - eGFR in normal range and within 360 days    EGFR (African American)  Date Value Ref Range Status  04/29/2014  >60 >36mL/min Final  01/14/2014 >60  Final   GFR calc Af Amer  Date Value Ref Range Status  10/05/2018 >60 >60 mL/min Final   EGFR (Non-African Amer.)  Date Value Ref Range Status  04/29/2014 >60 >61mL/min Final    Comment:    eGFR values <69mL/min/1.73 m2 may be an indication of chronic kidney disease (CKD). Calculated eGFR, using the MRDR Study equation, is useful in  patients with stable renal function. The eGFR calculation will not be reliable in acutely ill patients when serum creatinine is changing rapidly. It is not useful in patients on dialysis. The eGFR calculation may not be applicable to patients at the low and high extremes of body sizes, pregnant women, and vegetarians.   01/14/2014 58 (L)  Final    Comment:    eGFR values <55mL/min/1.73 m2 may be an indication of chronic kidney disease (CKD). Calculated eGFR is useful in patients with stable renal function. The eGFR calculation will not be reliable in acutely ill patients when serum creatinine is changing rapidly. It is not useful in  patients on dialysis. The eGFR calculation may not be applicable to patients at the low and high extremes of body sizes, pregnant women, and vegetarians.    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[facility])   [place] [facility] [place], Marsa PARAS, DO   7 months ago 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[facility])   [place] [facility] [place] Marsa PARAS, DO   8 months ago COVID-19    [facility] [place], Marsa PARAS, DO   8 months ago Exposure to confirmed case of COVID-19   [place] [facility]  [place] Angeline ORN, NP              Passed - CBC within normal limits and completed in the last 12 months    WBC  Date Value Ref Range Status  05/31/2023 11.0 (H) 3.8 - 10.8 Thousand/uL Final   RBC  Date Value Ref Range Status  05/31/2023 4.82 3.80 - 5.10 Million/uL Final   Hemoglobin  Date Value Ref Range Status  05/31/2023 12.8 11.7 - 15.5 g/dL Final   HGB  Date Value Ref Range Status  04/29/2014 11.0 (L) 12.0 - 16.0 g/dL Final   HCT  Date Value Ref Range Status  05/31/2023 39.9 35.0 - 45.0 % Final  04/29/2014 35.9 35.0 - 47.0 % Final   MCHC  Date Value Ref Range Status  05/31/2023 32.1 32.0 - 36.0 g/dL Final    Comment:    For adults, a slight decrease in the calculated MCHC value (in the range of 30 to 32 g/dL) is most likely not clinically significant; however, it should be interpreted with caution in correlation with other red cell parameters and the patient's clinical condition.    [facility]  Date Value Ref Range Status  05/31/2023 26.6 (L) 27.0 - 33.0 pg Final   MCV  Date Value Ref Range Status  05/31/2023 82.8 80.0 - 100.0 fL Final  04/29/2014 72 (L) 80 - 100 fL Final   No results found for: PLTCOUNTKUC, LABPLAT, POCPLA RDW  Date Value Ref Range Status  05/31/2023 14.7 11.0 - 15.0 % Final  04/29/2014 21.7 (H) 11.5 - 14.5 % Final

## 2024-05-18 ENCOUNTER — Ambulatory Visit: Admitting: Physical Therapy

## 2024-05-19 ENCOUNTER — Ambulatory Visit: Attending: Physical Medicine and Rehabilitation

## 2024-05-20 ENCOUNTER — Ambulatory Visit: Admitting: Physical Therapy

## 2024-05-20 NOTE — Addendum Note (Signed)
 Addended by: EDMAN MARSA PARAS on: 05/20/2024 07:44 PM   Modules accepted: Orders

## 2024-05-21 ENCOUNTER — Ambulatory Visit

## 2024-05-22 ENCOUNTER — Other Ambulatory Visit: Payer: Self-pay | Admitting: Family Medicine

## 2024-05-22 DIAGNOSIS — R109 Unspecified abdominal pain: Secondary | ICD-10-CM

## 2024-05-22 NOTE — Telephone Encounter (Signed)
 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 [place], Angeline ORN, [place]

## 2024-05-25 ENCOUNTER — Ambulatory Visit: Admitting: Physical Therapy

## 2024-05-25 ENCOUNTER — Ambulatory Visit: Admitting: Family Medicine

## 2024-05-26 ENCOUNTER — Ambulatory Visit

## 2024-05-26 ENCOUNTER — Telehealth: Payer: Self-pay

## 2024-05-26 ENCOUNTER — Other Ambulatory Visit (HOSPITAL_COMMUNITY): Payer: Self-pay

## 2024-05-26 NOTE — Telephone Encounter (Signed)
 Pharmacy Patient Advocate Encounter   Received notification from [facility] KEY that prior authorization for Dicyclomine  Hcl 10 caps is required/requested.   Insurance verification completed.   The patient is insured through [facility].   Per test claim: Refill too soon. PA is not needed at this time. Medication was filled 05/26/24. Next eligible fill date is 06/15/24.

## 2024-05-27 ENCOUNTER — Other Ambulatory Visit: Payer: Self-pay

## 2024-05-27 DIAGNOSIS — E1169 Type 2 diabetes mellitus with other specified complication: Secondary | ICD-10-CM

## 2024-05-27 DIAGNOSIS — B372 Candidiasis of skin and nail: Secondary | ICD-10-CM

## 2024-05-27 MED ORDER — DEXCOM G7 SENSOR MISC: 3 refills | Status: AC

## 2024-05-27 MED ORDER — ATORVASTATIN CALCIUM 20 MG PO TABS: 20.0000 mg | ORAL_TABLET | Freq: Every day | ORAL | 3 refills | Status: AC

## 2024-05-27 MED ORDER — NYSTATIN 100000 UNIT/GM EX POWD: 1.0000 | Freq: Three times a day (TID) | CUTANEOUS | 3 refills | Status: AC

## 2024-05-28 ENCOUNTER — Ambulatory Visit

## 2024-05-28 ENCOUNTER — Encounter: Payer: Self-pay | Admitting: *Deleted

## 2024-05-28 NOTE — Progress Notes (Signed)
 Traci Davis                                          MRN: [id number]   05/28/2024   The [facility] Quality Team Specialist reviewed this patient medical record for the purposes of chart review for care gap closure. The following were reviewed: abstraction for care gap closure-glycemic status assessment.    VBCI Quality Team

## 2024-06-01 ENCOUNTER — Ambulatory Visit

## 2024-06-03 ENCOUNTER — Ambulatory Visit

## 2024-06-03 LAB — HEMOGLOBIN A1C: Hemoglobin A1C: 9.1

## 2024-06-05 ENCOUNTER — Encounter: Payer: Self-pay | Admitting: Family Medicine

## 2024-06-05 ENCOUNTER — Ambulatory Visit: Admitting: Family Medicine

## 2024-06-05 VITALS — BP 134/84 | HR 110 | Ht 62.0 in | Wt 217.0 lb

## 2024-06-05 DIAGNOSIS — F3341 Major depressive disorder, recurrent, in partial remission: Secondary | ICD-10-CM

## 2024-06-05 DIAGNOSIS — G801 Spastic diplegic cerebral palsy: Secondary | ICD-10-CM

## 2024-06-05 DIAGNOSIS — G8929 Other chronic pain: Secondary | ICD-10-CM

## 2024-06-05 DIAGNOSIS — E1169 Type 2 diabetes mellitus with other specified complication: Secondary | ICD-10-CM

## 2024-06-05 DIAGNOSIS — Z794 Long term (current) use of insulin: Secondary | ICD-10-CM

## 2024-06-05 DIAGNOSIS — H6993 Unspecified Eustachian tube disorder, bilateral: Secondary | ICD-10-CM

## 2024-06-05 DIAGNOSIS — B379 Candidiasis, unspecified: Secondary | ICD-10-CM

## 2024-06-05 DIAGNOSIS — J0110 Acute frontal sinusitis, unspecified: Secondary | ICD-10-CM

## 2024-06-05 MED ORDER — FLUCONAZOLE 150 MG PO TABS: ORAL_TABLET | ORAL | 0 refills | Status: AC

## 2024-06-05 MED ORDER — AMOXICILLIN-POT CLAVULANATE 875-125 MG PO TABS: 1.0000 | ORAL_TABLET | Freq: Two times a day (BID) | ORAL | 0 refills | Status: AC

## 2024-06-05 NOTE — Patient Instructions (Addendum)
 Thank you for coming to the office today.  Suggest trial on [email or web address] to setup with personalized telehealth nutritionist for more daily meal prep / accountability and food planning.  Treat for sinusitis / possible early ear infection L side with fluid effusion.  Keep on allergy / sinus medications.  Ibuprofen + Tylenol  1-2 weeks continuous may pause for 5-7 days and keep Tylenol  then resume combination therapy.  Please schedule a Follow-up Appointment to: No follow-ups on file.  If you have any other questions or concerns, please feel free to call the office or send a message through MyChart. You may also schedule an earlier appointment if necessary.  Additionally, you may be receiving a survey about your experience at our office within a few days to 1 week by e-mail or mail. We value your feedback.  Marsa Officer, DO [facility], [place]

## 2024-06-05 NOTE — Progress Notes (Addendum)
 "  Subjective:    Patient ID: Traci Davis, female    DOB: 21-Jan-1971, 53 y.o.   MRN: [id number]  Traci Davis is a 54 y.o. female presenting on 06/05/2024 for Medical Management of Chronic Issues   HPI  Discussed the use of AI scribe software for clinical note transcription with the patient, who gave verbal consent to proceed.  History of Present Illness   Traci Davis is a 54 year old female with diabetes and cerebral palsy who presents with ear pain and concerns about blood sugar management.  Otalgia and sinus symptoms - Bilateral ear pain for 2-3 days - No ear drainage - No fever - Sinus symptoms present - Using Flonase , Singulair , and Astelin as prescribed by ENT without symptom relief  Diabetes Type 2 with hyperglycemia, chronic pancreatitis Morbid Obesity Concerns with management and weight gain [facility]ed [facility] Endocrinology - Concern regarding elevated blood sugar levels - Increase in A1c - Weight gain to 217 pounds - Weight gain attributed to discontinuation of physical therapy in October while awaiting new brace, which was received at end of January - Recently evaluated by diabetes specialist - Medication adjustments w insulin  recently  - Awaiting training for Omnipod insulin  pump per Endocrine - Continues to use Dexcom for glucose monitoring - Difficulty managing diet, especially cravings for sweets in the evening - Previous consultation with nutritionist  Cerebral Palsy, Spastic Diplegia Mobility impairment due to cerebral palsy - Cold weather exacerbates cerebral palsy symptoms - Mobility affected by cerebral palsy - Physical therapy discontinued in October while waiting for new brace, resumed after brace received at end of January  Chronic R Knee Pain Osteoarthritis Prior surgical partial replacement multiple surgeries before Awaiting consult w/ Dr Edie [facility] - Inquires about use of ibuprofen and Tylenol  for pain management     Major  Depression recurrent partial remission No new concerns today Mood is stable On medication   Past Surgical History:  Procedure Laterality Date   CESAREAN SECTION  1995   CHOLECYSTECTOMY     DILATATION & CURETTAGE/HYSTEROSCOPY WITH MYOSURE N/A 02/20/2018   Procedure: DILATATION & CURETTAGE/HYSTEROSCOPY WITH MYOSURE ENDOMETRIAL POLYPECTOMY;  Surgeon: Leonce Garnette BIRCH, MD;  Location: [facility] ORS;  Service: Gynecology;  Laterality: N/A;   ESOPHAGOGASTRODUODENOSCOPY (EGD) WITH PROPOFOL  N/A 01/01/2019   Procedure: ESOPHAGOGASTRODUODENOSCOPY (EGD) WITH PROPOFOL ;  Surgeon: Unk Corinn Skiff, MD;  Location: [facility] ENDOSCOPY;  Service: Gastroenterology;  Laterality: N/A;   FOOT CAPSULE RELEASE W/ PERCUTANEOUS HEEL CORD LENGTHENING, TIBIAL TENDON TRANSFER     age 38 ,and 2010-both legs   HYSTEROSCOPY WITH D & C N/A 04/13/2024   Procedure: DILATATION AND CURETTAGE /HYSTEROSCOPY;  Surgeon: Leigh Sober, MD;  Location: [facility] ORS;  Service: Gynecology;  Laterality: N/A;   JOINT REPLACEMENT Right    both knees partial replacements dates unknown   knee replacment     x 5 per pt. last one- left knee 2014. has had 2 on R 3 on L   TYMPANOPLASTY WITH GRAFT Right 01/08/2018   Procedure: TYMPANOPLASTY WITH POSSIBLE OSSICULAR CHAIN RECONSTRUCTION;  Surgeon: Blair Mt, MD;  Location: [facility] ORS;  Service: ENT;  Laterality: Right;        06/05/2024    3:16 PM 02/27/2023    4:52 PM 12/06/2022    3:34 PM  Depression screen PHQ 2/9  Decreased Interest 0 0   Down, Depressed, Hopeless 2 0   PHQ - 2 Score 2 0   Altered sleeping 0    Tired, decreased  energy 2    Change in appetite 0    Feeling bad or failure about yourself  0    Trouble concentrating 1    Moving slowly or fidgety/restless 0    Suicidal thoughts 0    PHQ-9 Score 5       Information is confidential and restricted. Go to Review Flowsheets to unlock data.       06/05/2024    3:18 PM 12/06/2022    3:34 PM 04/05/2022    4:18 PM 06/06/2021     4:51 PM  GAD 7 : Generalized Anxiety Score  Nervous, Anxious, on Edge 0     Control/stop worrying 0     Worry too much - different things 0     Trouble relaxing 1     Restless 0     Easily annoyed or irritable 0     Afraid - awful might happen 0     Total GAD 7 Score 1     Anxiety Difficulty         Information is confidential and restricted. Go to Review Flowsheets to unlock data.    Social History[1]  Review of Systems Per HPI unless specifically indicated above     Objective:    BP 134/84 (BP Location: Right Arm, Patient Position: Sitting, Cuff Size: Large)   Pulse (!) 110   Ht 5' 2 (1.575 m)   Wt 217 lb (98.4 kg)   LMP 08/20/2014 Comment: missed cup when trying to obtain urine spec  SpO2 94%   BMI 39.69 kg/m   Wt Readings from Last 3 Encounters:  06/05/24 217 lb (98.4 kg)  03/17/24 205 lb (93 kg)  01/15/24 200 lb (90.7 kg)    Physical Exam Vitals and nursing note reviewed.  Constitutional:      General: She is not in acute distress.    Appearance: Normal appearance. She is well-developed. She is obese. She is not diaphoretic.     Comments: Well-appearing, comfortable, cooperative  HENT:     Head: Normocephalic and atraumatic.     Right Ear: Tympanic membrane, ear canal and external ear normal. There is no impacted cerumen.     Ears:     Comments: L TM effusion without erythema.    Nose: Congestion present.  Eyes:     General:        Right eye: No discharge.        Left eye: No discharge.     Conjunctiva/sclera: Conjunctivae normal.  Cardiovascular:     Rate and Rhythm: Normal rate.  Pulmonary:     Effort: Pulmonary effort is normal.  Abdominal:     General: Bowel sounds are normal.     Palpations: There is no mass.     Tenderness: There is no guarding.  Musculoskeletal:     Comments: Bilateral lower extremity reduced mobility with CP chronic wearing lower extremity hinge brace, has under developed muscles on left lower extremity.  Rolling walker   Skin:    General: Skin is warm and dry.     Findings: No erythema or rash.  Neurological:     Mental Status: She is alert and oriented to person, place, and time.  Psychiatric:        Mood and Affect: Mood normal.        Behavior: Behavior normal.        Thought Content: Thought content normal.     Comments: Well groomed, good eye contact, normal speech and thoughts  Results for orders placed or performed in visit on 06/05/24  Hemoglobin A1c   Collection Time: 06/03/24 12:00 AM  Result Value Ref Range   Hemoglobin A1C 9.1       Assessment & Plan:   Problem List Items Addressed This Visit     Chronic midline low back pain without sciatica   Major depressive disorder, recurrent episode, in partial remission   Obesity, morbid (HCC)   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(HCC)   Type 2 diabetes mellitus with other specified complication (HCC) - Primary   Other Visit Diagnoses       Antibiotic-induced yeast infection       Relevant Medications   fluconazole  (DIFLUCAN ) 150 MG tablet     Acute non-recurrent frontal sinusitis       Relevant Medications   amoxicillin -clavulanate (AUGMENTIN ) 875-125 MG tablet   fluconazole  (DIFLUCAN ) 150 MG tablet     Eustachian tube dysfunction, bilateral         Long term current use of insulin  (HCC)            Acute frontal sinusitis with Eustachian tube dysfunction Bilateral ear pain with fluid behind left eardrum, likely due to fluid pressure. Allergy medications insufficient. - Prescribed Augmentin  for sinusitis and possible early ear infection, especially on the left side. - Continue allergy and sinus medications. - Prescribed fluconazole  to prevent yeast infection post-antibiotic course.  Type 2 diabetes mellitus with hyperglycemia / chronic pancreatitis Followed by [facility] Last Elevated A1c with recent weight gain. On basal bolus insulin  currently and now Awaiting Omnipod insulin  pump training Dexcom G7 - Continue  current diabetes medications as prescribed by the diabetes specialist. - Await Omnipod insulin  pump training. - Recommended trial of Nourish for telehealth nutritionist support for meal planning and accountability.  Morbid Obesity BMI >39 Comorbid conditions with Diabetes Pursue telehealth nutritionist Nourish  Cerebral palsy, spastic diplegia Chronic weakness left sided lower extremity, has custom brace, followed by PT regimen Mobility challenges exacerbated by weather conditions and delay in physical therapy.  Osteoarthritis, Chronic knee pain Prior surgery prior partial knee replacement L side multiple surgeries in past. Tenderness managed with ibuprofen and Tylenol . Normal kidney function as of November. - Continue ibuprofen and Tylenol  for 1-2 weeks, then pause for 5-7 days before resuming. - Use ACE bandage for support. - Consider topical treatments if needed.    Awaiting consult from [facility]  Major Depression recurrent partial remission Chronic problem, on med management improved     Orders Placed This Encounter  Procedures   Hemoglobin A1c    This external order was created through the Results Console.    Meds ordered this encounter  Medications   amoxicillin -clavulanate (AUGMENTIN ) 875-125 MG tablet    Sig: Take 1 tablet by mouth 2 (two) times daily.    Dispense:  20 tablet    Refill:  0   fluconazole  (DIFLUCAN ) 150 MG tablet    Sig: Take one tablet by mouth on Day 1. Repeat dose 2nd tablet on Day 3.    Dispense:  2 tablet    Refill:  0    Follow up plan: Return in about 4 months (around 10/03/2024) for 4 month follow-up DM Endocrine, Knee Ortho, Labs after.  Marsa Officer, DO [facility] 06/05/2024, 3:28 PM      [1] Social History Tobacco Use   Smoking status: Never   Smokeless tobacco: Never  Vaping Use   Vaping status: Never Used  Substance  Use Topics   Alcohol use: No   Drug  use: No  "

## 2024-06-22 ENCOUNTER — Ambulatory Visit: Admitting: Psychiatry

## 2024-06-22 ENCOUNTER — Other Ambulatory Visit

## 2024-10-06 ENCOUNTER — Ambulatory Visit: Admitting: Family Medicine
